# Patient Record
Sex: Female | Born: 1960 | Race: Black or African American | Hispanic: No | Marital: Single | Smoking: Never smoker
Health system: Southern US, Community
[De-identification: ages and names within clinical notes are randomized; demographics above are authoritative.]

## PROBLEM LIST (undated history)

## (undated) DIAGNOSIS — G35 Multiple sclerosis: Secondary | ICD-10-CM

## (undated) DIAGNOSIS — T148XXA Other injury of unspecified body region, initial encounter: Secondary | ICD-10-CM

## (undated) DIAGNOSIS — H60399 Other infective otitis externa, unspecified ear: Secondary | ICD-10-CM

## (undated) DIAGNOSIS — H119 Unspecified disorder of conjunctiva: Secondary | ICD-10-CM

## (undated) DIAGNOSIS — R5382 Chronic fatigue, unspecified: Secondary | ICD-10-CM

## (undated) DIAGNOSIS — M1990 Unspecified osteoarthritis, unspecified site: Secondary | ICD-10-CM

## (undated) DIAGNOSIS — Z9221 Personal history of antineoplastic chemotherapy: Secondary | ICD-10-CM

## (undated) DIAGNOSIS — J069 Acute upper respiratory infection, unspecified: Secondary | ICD-10-CM

## (undated) DIAGNOSIS — I10 Essential (primary) hypertension: Secondary | ICD-10-CM

## (undated) DIAGNOSIS — Z7901 Long term (current) use of anticoagulants: Secondary | ICD-10-CM

## (undated) DIAGNOSIS — F419 Anxiety disorder, unspecified: Secondary | ICD-10-CM

## (undated) DIAGNOSIS — C50919 Malignant neoplasm of unspecified site of unspecified female breast: Secondary | ICD-10-CM

## (undated) DIAGNOSIS — F411 Generalized anxiety disorder: Secondary | ICD-10-CM

## (undated) DIAGNOSIS — T7840XA Allergy, unspecified, initial encounter: Secondary | ICD-10-CM

## (undated) DIAGNOSIS — G4730 Sleep apnea, unspecified: Secondary | ICD-10-CM

## (undated) DIAGNOSIS — Z87898 Personal history of other specified conditions: Secondary | ICD-10-CM

## (undated) DIAGNOSIS — R05 Cough: Secondary | ICD-10-CM

## (undated) DIAGNOSIS — K635 Polyp of colon: Secondary | ICD-10-CM

## (undated) DIAGNOSIS — K5909 Other constipation: Secondary | ICD-10-CM

## (undated) DIAGNOSIS — T783XXA Angioneurotic edema, initial encounter: Secondary | ICD-10-CM

## (undated) DIAGNOSIS — D6481 Anemia due to antineoplastic chemotherapy: Secondary | ICD-10-CM

## (undated) DIAGNOSIS — E785 Hyperlipidemia, unspecified: Secondary | ICD-10-CM

## (undated) DIAGNOSIS — N950 Postmenopausal bleeding: Secondary | ICD-10-CM

## (undated) DIAGNOSIS — E65 Localized adiposity: Secondary | ICD-10-CM

## (undated) DIAGNOSIS — R079 Chest pain, unspecified: Secondary | ICD-10-CM

## (undated) DIAGNOSIS — R5381 Other malaise: Secondary | ICD-10-CM

## (undated) DIAGNOSIS — Z86010 Personal history of colonic polyps: Secondary | ICD-10-CM

## (undated) DIAGNOSIS — R198 Other specified symptoms and signs involving the digestive system and abdomen: Secondary | ICD-10-CM

## (undated) DIAGNOSIS — G43909 Migraine, unspecified, not intractable, without status migrainosus: Secondary | ICD-10-CM

## (undated) DIAGNOSIS — J329 Chronic sinusitis, unspecified: Secondary | ICD-10-CM

## (undated) DIAGNOSIS — K219 Gastro-esophageal reflux disease without esophagitis: Secondary | ICD-10-CM

## (undated) DIAGNOSIS — E739 Lactose intolerance, unspecified: Secondary | ICD-10-CM

## (undated) DIAGNOSIS — J0100 Acute maxillary sinusitis, unspecified: Secondary | ICD-10-CM

## (undated) DIAGNOSIS — B029 Zoster without complications: Secondary | ICD-10-CM

## (undated) DIAGNOSIS — J3089 Other allergic rhinitis: Secondary | ICD-10-CM

## (undated) DIAGNOSIS — H61899 Other specified disorders of external ear, unspecified ear: Secondary | ICD-10-CM

## (undated) DIAGNOSIS — C50411 Malignant neoplasm of upper-outer quadrant of right female breast: Secondary | ICD-10-CM

## (undated) DIAGNOSIS — R232 Flushing: Secondary | ICD-10-CM

## (undated) DIAGNOSIS — R062 Wheezing: Secondary | ICD-10-CM

## (undated) DIAGNOSIS — N390 Urinary tract infection, site not specified: Secondary | ICD-10-CM

## (undated) DIAGNOSIS — J22 Unspecified acute lower respiratory infection: Secondary | ICD-10-CM

## (undated) DIAGNOSIS — F329 Major depressive disorder, single episode, unspecified: Secondary | ICD-10-CM

## (undated) DIAGNOSIS — K589 Irritable bowel syndrome without diarrhea: Secondary | ICD-10-CM

## (undated) DIAGNOSIS — D6959 Other secondary thrombocytopenia: Secondary | ICD-10-CM

## (undated) DIAGNOSIS — E876 Hypokalemia: Secondary | ICD-10-CM

## (undated) DIAGNOSIS — R1012 Left upper quadrant pain: Secondary | ICD-10-CM

## (undated) HISTORY — DX: Other allergic rhinitis: J30.89

## (undated) HISTORY — DX: Multiple sclerosis: G35

## (undated) HISTORY — DX: Hyperlipidemia, unspecified: E78.5

## (undated) HISTORY — DX: Lactose intolerance, unspecified: E73.9

## (undated) HISTORY — DX: Sleep apnea, unspecified: G47.30

## (undated) HISTORY — DX: Essential (primary) hypertension: I10

## (undated) HISTORY — DX: Gastro-esophageal reflux disease without esophagitis: K21.9

## (undated) HISTORY — DX: Polyp of colon: K63.5

## (undated) HISTORY — DX: Angioneurotic edema, initial encounter: T78.3XXA

## (undated) HISTORY — DX: Anxiety disorder, unspecified: F41.9

## (undated) HISTORY — DX: Localized adiposity: E65

## (undated) HISTORY — DX: Zoster without complications: B02.9

## (undated) HISTORY — DX: Other constipation: K59.09

## (undated) HISTORY — DX: Migraine, unspecified, not intractable, without status migrainosus: G43.909

## (undated) HISTORY — DX: Irritable bowel syndrome, unspecified: K58.9

## (undated) HISTORY — DX: Flushing: R23.2

## (undated) HISTORY — PX: TUBAL LIGATION: SHX77

## (undated) HISTORY — DX: Malignant neoplasm of upper-outer quadrant of right female breast: C50.411

## (undated) HISTORY — DX: Allergy, unspecified, initial encounter: T78.40XA

## (undated) HISTORY — DX: Unspecified osteoarthritis, unspecified site: M19.90

---
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
## 1998-03-26 ENCOUNTER — Other Ambulatory Visit: Admission: RE | Admit: 1998-03-26 | Discharge: 1998-03-26 | Payer: Self-pay | Admitting: Internal Medicine

## 1999-03-25 ENCOUNTER — Emergency Department (HOSPITAL_COMMUNITY): Admission: EM | Admit: 1999-03-25 | Discharge: 1999-03-25 | Payer: Self-pay | Admitting: Emergency Medicine

## 1999-08-30 ENCOUNTER — Other Ambulatory Visit: Admission: RE | Admit: 1999-08-30 | Discharge: 1999-08-30 | Payer: Self-pay | Admitting: Emergency Medicine

## 1999-08-30 ENCOUNTER — Other Ambulatory Visit: Admission: RE | Admit: 1999-08-30 | Discharge: 1999-08-30 | Payer: Self-pay | Admitting: *Deleted

## 2000-01-10 ENCOUNTER — Other Ambulatory Visit: Admission: RE | Admit: 2000-01-10 | Discharge: 2000-01-10 | Payer: Self-pay | Admitting: *Deleted

## 2000-01-10 ENCOUNTER — Encounter (INDEPENDENT_AMBULATORY_CARE_PROVIDER_SITE_OTHER): Payer: Self-pay

## 2000-03-23 ENCOUNTER — Other Ambulatory Visit: Admission: RE | Admit: 2000-03-23 | Discharge: 2000-03-23 | Payer: Self-pay | Admitting: *Deleted

## 2001-01-13 ENCOUNTER — Emergency Department (HOSPITAL_COMMUNITY): Admission: EM | Admit: 2001-01-13 | Discharge: 2001-01-13 | Payer: Self-pay | Admitting: Emergency Medicine

## 2001-02-20 ENCOUNTER — Inpatient Hospital Stay (HOSPITAL_COMMUNITY): Admission: AD | Admit: 2001-02-20 | Discharge: 2001-02-20 | Payer: Self-pay | Admitting: Obstetrics

## 2001-04-11 ENCOUNTER — Other Ambulatory Visit: Admission: RE | Admit: 2001-04-11 | Discharge: 2001-04-11 | Payer: Self-pay | Admitting: *Deleted

## 2001-04-11 ENCOUNTER — Other Ambulatory Visit: Admission: RE | Admit: 2001-04-11 | Discharge: 2001-04-11 | Payer: Self-pay | Admitting: Obstetrics and Gynecology

## 2001-07-18 ENCOUNTER — Other Ambulatory Visit: Admission: RE | Admit: 2001-07-18 | Discharge: 2001-07-18 | Payer: Self-pay | Admitting: Obstetrics and Gynecology

## 2002-07-30 ENCOUNTER — Other Ambulatory Visit: Admission: RE | Admit: 2002-07-30 | Discharge: 2002-07-30 | Payer: Self-pay | Admitting: Obstetrics and Gynecology

## 2003-03-09 ENCOUNTER — Emergency Department (HOSPITAL_COMMUNITY): Admission: EM | Admit: 2003-03-09 | Discharge: 2003-03-10 | Payer: Self-pay | Admitting: Emergency Medicine

## 2003-03-09 ENCOUNTER — Encounter: Payer: Self-pay | Admitting: Emergency Medicine

## 2003-07-08 ENCOUNTER — Encounter: Admission: RE | Admit: 2003-07-08 | Discharge: 2003-07-08 | Payer: Self-pay | Admitting: Cardiology

## 2003-07-08 ENCOUNTER — Encounter: Payer: Self-pay | Admitting: Cardiology

## 2003-08-03 ENCOUNTER — Other Ambulatory Visit: Admission: RE | Admit: 2003-08-03 | Discharge: 2003-08-03 | Payer: Self-pay | Admitting: Obstetrics and Gynecology

## 2003-09-21 ENCOUNTER — Encounter: Admission: RE | Admit: 2003-09-21 | Discharge: 2003-09-21 | Payer: Self-pay | Admitting: Neurology

## 2004-08-23 ENCOUNTER — Ambulatory Visit (HOSPITAL_COMMUNITY): Admission: RE | Admit: 2004-08-23 | Discharge: 2004-08-23 | Payer: Self-pay | Admitting: Obstetrics and Gynecology

## 2004-09-02 ENCOUNTER — Other Ambulatory Visit: Admission: RE | Admit: 2004-09-02 | Discharge: 2004-09-02 | Payer: Self-pay | Admitting: Obstetrics and Gynecology

## 2004-09-02 ENCOUNTER — Ambulatory Visit: Payer: Self-pay | Admitting: Internal Medicine

## 2004-11-30 ENCOUNTER — Ambulatory Visit (HOSPITAL_BASED_OUTPATIENT_CLINIC_OR_DEPARTMENT_OTHER): Admission: RE | Admit: 2004-11-30 | Discharge: 2004-11-30 | Payer: Self-pay | Admitting: Cardiology

## 2004-11-30 ENCOUNTER — Ambulatory Visit: Payer: Self-pay | Admitting: Cardiology

## 2005-01-23 ENCOUNTER — Emergency Department (HOSPITAL_COMMUNITY): Admission: EM | Admit: 2005-01-23 | Discharge: 2005-01-23 | Payer: Self-pay | Admitting: Family Medicine

## 2005-01-25 ENCOUNTER — Ambulatory Visit: Payer: Self-pay | Admitting: Internal Medicine

## 2005-08-22 ENCOUNTER — Other Ambulatory Visit: Admission: RE | Admit: 2005-08-22 | Discharge: 2005-08-22 | Payer: Self-pay | Admitting: *Deleted

## 2005-09-15 ENCOUNTER — Ambulatory Visit (HOSPITAL_COMMUNITY): Admission: RE | Admit: 2005-09-15 | Discharge: 2005-09-15 | Payer: Self-pay | Admitting: *Deleted

## 2005-10-04 ENCOUNTER — Ambulatory Visit: Payer: Self-pay | Admitting: Internal Medicine

## 2006-01-09 ENCOUNTER — Ambulatory Visit: Payer: Self-pay | Admitting: Internal Medicine

## 2006-01-17 ENCOUNTER — Ambulatory Visit: Payer: Self-pay | Admitting: Gastroenterology

## 2006-01-18 ENCOUNTER — Ambulatory Visit: Payer: Self-pay | Admitting: Internal Medicine

## 2006-01-23 ENCOUNTER — Ambulatory Visit: Payer: Self-pay | Admitting: Internal Medicine

## 2006-03-27 ENCOUNTER — Ambulatory Visit: Payer: Self-pay | Admitting: Internal Medicine

## 2006-04-02 ENCOUNTER — Encounter (INDEPENDENT_AMBULATORY_CARE_PROVIDER_SITE_OTHER): Payer: Self-pay | Admitting: *Deleted

## 2006-04-02 ENCOUNTER — Ambulatory Visit: Payer: Self-pay | Admitting: Internal Medicine

## 2006-07-19 ENCOUNTER — Ambulatory Visit: Payer: Self-pay | Admitting: Internal Medicine

## 2006-08-03 ENCOUNTER — Ambulatory Visit: Payer: Self-pay | Admitting: Cardiology

## 2006-08-27 ENCOUNTER — Encounter: Payer: Self-pay | Admitting: Cardiology

## 2006-08-27 ENCOUNTER — Ambulatory Visit: Payer: Self-pay

## 2006-09-03 ENCOUNTER — Encounter: Payer: Self-pay | Admitting: Internal Medicine

## 2006-09-20 ENCOUNTER — Ambulatory Visit (HOSPITAL_COMMUNITY): Admission: RE | Admit: 2006-09-20 | Discharge: 2006-09-20 | Payer: Self-pay | Admitting: Obstetrics & Gynecology

## 2006-09-25 ENCOUNTER — Encounter: Admission: RE | Admit: 2006-09-25 | Discharge: 2006-09-25 | Payer: Self-pay | Admitting: Obstetrics & Gynecology

## 2006-12-27 ENCOUNTER — Encounter: Admission: RE | Admit: 2006-12-27 | Discharge: 2006-12-27 | Payer: Self-pay | Admitting: Neurology

## 2007-01-16 ENCOUNTER — Ambulatory Visit: Payer: Self-pay | Admitting: Cardiology

## 2007-01-21 ENCOUNTER — Ambulatory Visit: Payer: Self-pay | Admitting: Internal Medicine

## 2007-03-21 ENCOUNTER — Ambulatory Visit: Payer: Self-pay | Admitting: Cardiology

## 2007-03-25 ENCOUNTER — Encounter: Admission: RE | Admit: 2007-03-25 | Discharge: 2007-03-25 | Payer: Self-pay | Admitting: Obstetrics & Gynecology

## 2007-06-11 ENCOUNTER — Ambulatory Visit: Payer: Self-pay | Admitting: Internal Medicine

## 2007-09-02 LAB — CONVERTED CEMR LAB: Pap Smear: NORMAL

## 2007-09-18 ENCOUNTER — Encounter: Payer: Self-pay | Admitting: Internal Medicine

## 2007-09-18 DIAGNOSIS — G35 Multiple sclerosis: Secondary | ICD-10-CM

## 2007-09-18 DIAGNOSIS — Z87898 Personal history of other specified conditions: Secondary | ICD-10-CM

## 2007-09-18 HISTORY — DX: Personal history of other specified conditions: Z87.898

## 2007-09-20 ENCOUNTER — Ambulatory Visit: Payer: Self-pay | Admitting: Internal Medicine

## 2007-09-20 DIAGNOSIS — I10 Essential (primary) hypertension: Secondary | ICD-10-CM | POA: Insufficient documentation

## 2007-09-20 DIAGNOSIS — E65 Localized adiposity: Secondary | ICD-10-CM

## 2007-09-26 ENCOUNTER — Encounter: Admission: RE | Admit: 2007-09-26 | Discharge: 2007-09-26 | Payer: Self-pay | Admitting: Obstetrics & Gynecology

## 2007-11-15 ENCOUNTER — Encounter: Payer: Self-pay | Admitting: Internal Medicine

## 2007-12-10 ENCOUNTER — Encounter: Payer: Self-pay | Admitting: Internal Medicine

## 2007-12-11 ENCOUNTER — Telehealth: Payer: Self-pay | Admitting: Internal Medicine

## 2007-12-13 ENCOUNTER — Telehealth: Payer: Self-pay | Admitting: Internal Medicine

## 2007-12-30 ENCOUNTER — Ambulatory Visit: Payer: Self-pay | Admitting: Internal Medicine

## 2007-12-31 ENCOUNTER — Emergency Department (HOSPITAL_COMMUNITY): Admission: EM | Admit: 2007-12-31 | Discharge: 2007-12-31 | Payer: Self-pay | Admitting: Family Medicine

## 2007-12-31 LAB — CONVERTED CEMR LAB
ALT: 12 units/L (ref 0–35)
Calcium: 9.2 mg/dL (ref 8.4–10.5)
Cholesterol: 213 mg/dL (ref 0–200)
Direct LDL: 142.2 mg/dL
GFR calc non Af Amer: 72 mL/min
HDL: 42.3 mg/dL (ref >39.0)
Hgb A1c MFr Bld: 5.9 % (ref 4.6–6.0)
Potassium: 3.5 meq/L (ref 3.5–5.1)
Sodium: 141 meq/L (ref 135–145)
Total CHOL/HDL Ratio: 5
Triglycerides: 145 mg/dL (ref 0–149)

## 2008-01-07 ENCOUNTER — Ambulatory Visit: Payer: Self-pay | Admitting: Internal Medicine

## 2008-01-07 DIAGNOSIS — K219 Gastro-esophageal reflux disease without esophagitis: Secondary | ICD-10-CM | POA: Insufficient documentation

## 2008-01-07 DIAGNOSIS — E8881 Metabolic syndrome: Secondary | ICD-10-CM

## 2008-01-08 ENCOUNTER — Ambulatory Visit: Payer: Self-pay | Admitting: Cardiology

## 2008-01-21 ENCOUNTER — Telehealth (INDEPENDENT_AMBULATORY_CARE_PROVIDER_SITE_OTHER): Payer: Self-pay | Admitting: *Deleted

## 2008-01-27 ENCOUNTER — Encounter: Payer: Self-pay | Admitting: Internal Medicine

## 2008-02-13 ENCOUNTER — Encounter: Payer: Self-pay | Admitting: Internal Medicine

## 2008-04-24 ENCOUNTER — Ambulatory Visit: Payer: Self-pay | Admitting: Internal Medicine

## 2008-04-24 DIAGNOSIS — E785 Hyperlipidemia, unspecified: Secondary | ICD-10-CM

## 2008-04-30 ENCOUNTER — Encounter: Payer: Self-pay | Admitting: Internal Medicine

## 2008-05-04 ENCOUNTER — Encounter: Payer: Self-pay | Admitting: Internal Medicine

## 2008-05-20 ENCOUNTER — Telehealth: Payer: Self-pay | Admitting: Internal Medicine

## 2008-06-18 ENCOUNTER — Telehealth: Payer: Self-pay | Admitting: Internal Medicine

## 2008-06-22 ENCOUNTER — Ambulatory Visit: Payer: Self-pay | Admitting: Internal Medicine

## 2008-06-22 LAB — CONVERTED CEMR LAB
CRP, High Sensitivity: 16 — ABNORMAL HIGH (ref 0.00–5.00)
Cholesterol: 191 mg/dL (ref 0–200)

## 2008-06-24 ENCOUNTER — Ambulatory Visit: Payer: Self-pay | Admitting: Internal Medicine

## 2008-06-24 ENCOUNTER — Telehealth: Payer: Self-pay | Admitting: Internal Medicine

## 2008-06-24 DIAGNOSIS — R5381 Other malaise: Secondary | ICD-10-CM

## 2008-06-24 DIAGNOSIS — J069 Acute upper respiratory infection, unspecified: Secondary | ICD-10-CM | POA: Insufficient documentation

## 2008-06-24 DIAGNOSIS — R5383 Other fatigue: Secondary | ICD-10-CM

## 2008-06-24 HISTORY — DX: Other malaise: R53.81

## 2008-06-24 LAB — CONVERTED CEMR LAB
Basophils Absolute: 0 10*3/uL (ref 0.0–0.1)
HCT: 34.8 % — ABNORMAL LOW (ref 36.0–46.0)
Lymphocytes Relative: 24.7 % (ref 12.0–46.0)
MCHC: 33.3 g/dL (ref 30.0–36.0)
MCV: 74.9 fL — ABNORMAL LOW (ref 78.0–100.0)
Monocytes Absolute: 0.7 10*3/uL (ref 0.1–1.0)
Monocytes Relative: 8.1 % (ref 3.0–12.0)
Platelets: 386 10*3/uL (ref 150–400)
RBC: 4.64 M/uL (ref 3.87–5.11)
RDW: 16.3 % — ABNORMAL HIGH (ref 11.5–14.6)

## 2008-06-26 ENCOUNTER — Encounter: Payer: Self-pay | Admitting: Internal Medicine

## 2008-07-08 ENCOUNTER — Telehealth: Payer: Self-pay | Admitting: Internal Medicine

## 2008-07-15 ENCOUNTER — Telehealth: Payer: Self-pay | Admitting: Internal Medicine

## 2008-07-17 ENCOUNTER — Ambulatory Visit: Payer: Self-pay | Admitting: Internal Medicine

## 2008-08-17 DIAGNOSIS — K5909 Other constipation: Secondary | ICD-10-CM

## 2008-09-13 ENCOUNTER — Emergency Department (HOSPITAL_COMMUNITY): Admission: EM | Admit: 2008-09-13 | Discharge: 2008-09-13 | Payer: Self-pay | Admitting: Family Medicine

## 2008-09-26 ENCOUNTER — Ambulatory Visit: Payer: Self-pay | Admitting: Family Medicine

## 2008-09-28 ENCOUNTER — Encounter: Admission: RE | Admit: 2008-09-28 | Discharge: 2008-09-28 | Payer: Self-pay | Admitting: Obstetrics & Gynecology

## 2008-10-15 ENCOUNTER — Telehealth: Payer: Self-pay | Admitting: Internal Medicine

## 2008-10-15 ENCOUNTER — Ambulatory Visit: Payer: Self-pay | Admitting: Internal Medicine

## 2008-10-15 DIAGNOSIS — J3089 Other allergic rhinitis: Secondary | ICD-10-CM

## 2008-10-19 ENCOUNTER — Ambulatory Visit: Payer: Self-pay | Admitting: Internal Medicine

## 2009-02-09 ENCOUNTER — Ambulatory Visit: Payer: Self-pay | Admitting: Internal Medicine

## 2009-03-01 ENCOUNTER — Emergency Department (HOSPITAL_COMMUNITY): Admission: EM | Admit: 2009-03-01 | Discharge: 2009-03-01 | Payer: Self-pay | Admitting: Family Medicine

## 2009-03-02 ENCOUNTER — Ambulatory Visit: Payer: Self-pay | Admitting: Internal Medicine

## 2009-03-14 ENCOUNTER — Emergency Department (HOSPITAL_BASED_OUTPATIENT_CLINIC_OR_DEPARTMENT_OTHER): Admission: EM | Admit: 2009-03-14 | Discharge: 2009-03-14 | Payer: Self-pay | Admitting: Emergency Medicine

## 2009-04-19 ENCOUNTER — Telehealth: Payer: Self-pay | Admitting: Internal Medicine

## 2009-04-20 ENCOUNTER — Ambulatory Visit: Payer: Self-pay | Admitting: Internal Medicine

## 2009-04-20 ENCOUNTER — Telehealth: Payer: Self-pay | Admitting: Internal Medicine

## 2009-04-21 ENCOUNTER — Ambulatory Visit: Payer: Self-pay | Admitting: Gastroenterology

## 2009-04-21 DIAGNOSIS — Z860100 Personal history of colon polyps, unspecified: Secondary | ICD-10-CM | POA: Insufficient documentation

## 2009-04-21 HISTORY — DX: Personal history of colonic polyps: Z86.010

## 2009-04-22 ENCOUNTER — Encounter: Payer: Self-pay | Admitting: Physician Assistant

## 2009-04-22 LAB — CONVERTED CEMR LAB
ALT: 18 units/L (ref 0–35)
AST: 22 units/L (ref 0–37)
BUN: 8 mg/dL (ref 6–23)
Basophils Relative: 3.2 % — ABNORMAL HIGH (ref 0.0–3.0)
Chloride: 99 meq/L (ref 96–112)
Eosinophils Relative: 1.8 % (ref 0.0–5.0)
GFR calc non Af Amer: 98.91 mL/min (ref >60)
H Pylori IgG: POSITIVE
Lymphs Abs: 1.9 10*3/uL (ref 0.7–4.0)
MCHC: 34.1 g/dL (ref 30.0–36.0)
Monocytes Absolute: 0.4 10*3/uL (ref 0.1–1.0)
Monocytes Relative: 5.2 % (ref 3.0–12.0)
Neutrophils Relative %: 62.8 % (ref 43.0–77.0)
Potassium: 3.1 meq/L — ABNORMAL LOW (ref 3.5–5.1)
RBC: 4.75 M/uL (ref 3.87–5.11)
Sodium: 139 meq/L (ref 135–145)
Total Bilirubin: 1.1 mg/dL (ref 0.3–1.2)
WBC: 6.9 10*3/uL (ref 4.5–10.5)

## 2009-05-03 ENCOUNTER — Ambulatory Visit: Payer: Self-pay | Admitting: Internal Medicine

## 2009-05-03 LAB — CONVERTED CEMR LAB
BUN: 7 mg/dL (ref 6–23)
CO2: 30 meq/L (ref 19–32)
Calcium: 9.9 mg/dL (ref 8.4–10.5)
Creatinine, Ser: 0.7 mg/dL (ref 0.4–1.2)
GFR calc non Af Amer: 115.38 mL/min (ref >60)
Glucose, Bld: 106 mg/dL — ABNORMAL HIGH (ref 70–99)

## 2009-05-04 ENCOUNTER — Telehealth: Payer: Self-pay | Admitting: Internal Medicine

## 2009-05-13 ENCOUNTER — Telehealth: Payer: Self-pay | Admitting: Internal Medicine

## 2009-05-19 ENCOUNTER — Telehealth: Payer: Self-pay | Admitting: Physician Assistant

## 2009-05-20 ENCOUNTER — Telehealth: Payer: Self-pay | Admitting: Cardiology

## 2009-05-28 ENCOUNTER — Ambulatory Visit: Payer: Self-pay | Admitting: Cardiology

## 2009-05-31 ENCOUNTER — Encounter: Payer: Self-pay | Admitting: Physician Assistant

## 2009-06-02 ENCOUNTER — Telehealth: Payer: Self-pay | Admitting: Physician Assistant

## 2009-06-07 ENCOUNTER — Telehealth: Payer: Self-pay | Admitting: Internal Medicine

## 2009-06-11 ENCOUNTER — Telehealth: Payer: Self-pay | Admitting: Cardiology

## 2009-06-16 ENCOUNTER — Telehealth: Payer: Self-pay | Admitting: Physician Assistant

## 2009-06-25 ENCOUNTER — Telehealth: Payer: Self-pay | Admitting: Internal Medicine

## 2009-06-28 ENCOUNTER — Ambulatory Visit: Payer: Self-pay | Admitting: Internal Medicine

## 2009-06-29 LAB — CONVERTED CEMR LAB
BUN: 11 mg/dL (ref 6–23)
CO2: 29 meq/L (ref 19–32)
Calcium: 9.6 mg/dL (ref 8.4–10.5)
Creatinine, Ser: 1.2 mg/dL (ref 0.4–1.2)
Glucose, Bld: 87 mg/dL (ref 70–99)
Sodium: 139 meq/L (ref 135–145)

## 2009-06-30 ENCOUNTER — Telehealth: Payer: Self-pay | Admitting: Internal Medicine

## 2009-07-11 ENCOUNTER — Telehealth: Payer: Self-pay | Admitting: Internal Medicine

## 2009-07-12 ENCOUNTER — Telehealth: Payer: Self-pay | Admitting: Internal Medicine

## 2009-07-12 ENCOUNTER — Ambulatory Visit: Payer: Self-pay | Admitting: Internal Medicine

## 2009-07-27 ENCOUNTER — Telehealth: Payer: Self-pay | Admitting: Physician Assistant

## 2009-08-18 LAB — CONVERTED CEMR LAB

## 2009-08-23 ENCOUNTER — Ambulatory Visit: Payer: Self-pay | Admitting: Internal Medicine

## 2009-09-20 ENCOUNTER — Telehealth (INDEPENDENT_AMBULATORY_CARE_PROVIDER_SITE_OTHER): Payer: Self-pay | Admitting: *Deleted

## 2009-09-20 ENCOUNTER — Encounter: Payer: Self-pay | Admitting: Internal Medicine

## 2009-09-29 ENCOUNTER — Ambulatory Visit: Payer: Self-pay | Admitting: Internal Medicine

## 2009-09-30 ENCOUNTER — Ambulatory Visit: Payer: Self-pay | Admitting: Internal Medicine

## 2009-10-04 LAB — CONVERTED CEMR LAB
AST: 19 units/L (ref 0–37)
BUN: 6 mg/dL (ref 6–23)
Basophils Absolute: 0 10*3/uL (ref 0.0–0.1)
Calcium: 8.8 mg/dL (ref 8.4–10.5)
Chloride: 103 meq/L (ref 96–112)
Cholesterol: 209 mg/dL — ABNORMAL HIGH (ref 0–200)
Eosinophils Absolute: 0.2 10*3/uL (ref 0.0–0.7)
Eosinophils Relative: 3.2 % (ref 0.0–5.0)
HDL: 41.1 mg/dL (ref >39.00)
Hemoglobin: 13 g/dL (ref 12.0–15.0)
Lymphs Abs: 2.2 10*3/uL (ref 0.7–4.0)
MCHC: 33.2 g/dL (ref 30.0–36.0)
MCV: 92.7 fL (ref 78.0–100.0)
Neutro Abs: 3 10*3/uL (ref 1.4–7.7)
Nitrite: NEGATIVE
Platelets: 292 10*3/uL (ref 150.0–400.0)
RBC: 4.23 M/uL (ref 3.87–5.11)
Sodium: 136 meq/L (ref 135–145)
Total Bilirubin: 0.8 mg/dL (ref 0.3–1.2)
Total CHOL/HDL Ratio: 5
Total Protein, Urine: NEGATIVE mg/dL
Triglycerides: 245 mg/dL — ABNORMAL HIGH (ref 0.0–149.0)
Urobilinogen, UA: 0.2 (ref 0.0–1.0)
WBC: 6 10*3/uL (ref 4.5–10.5)

## 2009-10-05 ENCOUNTER — Ambulatory Visit: Payer: Self-pay | Admitting: Internal Medicine

## 2009-10-11 ENCOUNTER — Encounter (INDEPENDENT_AMBULATORY_CARE_PROVIDER_SITE_OTHER): Payer: Self-pay | Admitting: *Deleted

## 2009-10-11 ENCOUNTER — Ambulatory Visit: Payer: Self-pay | Admitting: Internal Medicine

## 2009-10-21 ENCOUNTER — Telehealth: Payer: Self-pay | Admitting: Internal Medicine

## 2009-11-01 ENCOUNTER — Encounter: Admission: RE | Admit: 2009-11-01 | Discharge: 2009-11-01 | Payer: Self-pay | Admitting: Obstetrics & Gynecology

## 2009-11-05 ENCOUNTER — Ambulatory Visit: Payer: Self-pay | Admitting: Internal Medicine

## 2009-11-05 DIAGNOSIS — L910 Hypertrophic scar: Secondary | ICD-10-CM

## 2009-11-29 ENCOUNTER — Telehealth: Payer: Self-pay | Admitting: Internal Medicine

## 2009-11-29 ENCOUNTER — Ambulatory Visit: Payer: Self-pay | Admitting: Internal Medicine

## 2009-11-29 DIAGNOSIS — J309 Allergic rhinitis, unspecified: Secondary | ICD-10-CM

## 2009-12-14 ENCOUNTER — Telehealth: Payer: Self-pay | Admitting: Internal Medicine

## 2009-12-14 ENCOUNTER — Ambulatory Visit: Payer: Self-pay | Admitting: Internal Medicine

## 2009-12-14 DIAGNOSIS — R198 Other specified symptoms and signs involving the digestive system and abdomen: Secondary | ICD-10-CM

## 2009-12-14 HISTORY — DX: Other specified symptoms and signs involving the digestive system and abdomen: R19.8

## 2009-12-15 ENCOUNTER — Encounter: Payer: Self-pay | Admitting: Internal Medicine

## 2010-01-12 ENCOUNTER — Telehealth: Payer: Self-pay | Admitting: Internal Medicine

## 2010-01-14 ENCOUNTER — Ambulatory Visit: Payer: Self-pay | Admitting: Internal Medicine

## 2010-01-14 DIAGNOSIS — J329 Chronic sinusitis, unspecified: Secondary | ICD-10-CM | POA: Insufficient documentation

## 2010-01-14 HISTORY — DX: Chronic sinusitis, unspecified: J32.9

## 2010-01-27 ENCOUNTER — Telehealth: Payer: Self-pay | Admitting: Internal Medicine

## 2010-04-22 ENCOUNTER — Telehealth: Payer: Self-pay | Admitting: Internal Medicine

## 2010-05-19 ENCOUNTER — Ambulatory Visit: Payer: Self-pay | Admitting: Cardiology

## 2010-05-19 DIAGNOSIS — R079 Chest pain, unspecified: Secondary | ICD-10-CM

## 2010-05-19 DIAGNOSIS — R0789 Other chest pain: Secondary | ICD-10-CM | POA: Insufficient documentation

## 2010-05-19 HISTORY — DX: Chest pain, unspecified: R07.9

## 2010-06-07 ENCOUNTER — Telehealth: Payer: Self-pay | Admitting: Internal Medicine

## 2010-06-07 ENCOUNTER — Ambulatory Visit: Payer: Self-pay | Admitting: Internal Medicine

## 2010-06-07 DIAGNOSIS — M7710 Lateral epicondylitis, unspecified elbow: Secondary | ICD-10-CM | POA: Insufficient documentation

## 2010-06-09 ENCOUNTER — Telehealth: Payer: Self-pay | Admitting: Internal Medicine

## 2010-06-10 ENCOUNTER — Encounter: Payer: Self-pay | Admitting: Internal Medicine

## 2010-08-19 ENCOUNTER — Ambulatory Visit: Payer: Self-pay | Admitting: Internal Medicine

## 2010-08-19 DIAGNOSIS — N390 Urinary tract infection, site not specified: Secondary | ICD-10-CM | POA: Insufficient documentation

## 2010-08-19 HISTORY — DX: Urinary tract infection, site not specified: N39.0

## 2010-08-19 LAB — CONVERTED CEMR LAB
Bilirubin Urine: NEGATIVE
Urobilinogen, UA: 0.2
pH: 7

## 2010-09-13 ENCOUNTER — Encounter: Payer: Self-pay | Admitting: Internal Medicine

## 2010-09-22 ENCOUNTER — Encounter: Payer: Self-pay | Admitting: Internal Medicine

## 2010-09-22 LAB — CONVERTED CEMR LAB

## 2010-10-08 ENCOUNTER — Encounter: Payer: Self-pay | Admitting: Neurology

## 2010-10-08 ENCOUNTER — Encounter: Payer: Self-pay | Admitting: Cardiology

## 2010-10-08 ENCOUNTER — Other Ambulatory Visit: Payer: Self-pay | Admitting: Obstetrics & Gynecology

## 2010-10-08 DIAGNOSIS — Z1239 Encounter for other screening for malignant neoplasm of breast: Secondary | ICD-10-CM

## 2010-10-09 ENCOUNTER — Encounter: Payer: Self-pay | Admitting: Internal Medicine

## 2010-10-09 ENCOUNTER — Encounter: Payer: Self-pay | Admitting: Obstetrics & Gynecology

## 2010-10-09 ENCOUNTER — Encounter: Payer: Self-pay | Admitting: Neurology

## 2010-10-10 ENCOUNTER — Encounter: Payer: Self-pay | Admitting: Neurology

## 2010-10-18 NOTE — Progress Notes (Signed)
Summary: protonix  Phone Note Call from Patient Call back at Home Phone [phone]   Caller: Patient Reason for Call: Refill Medication Summary of Call: Requesting refill on her protonix forgot to get at appt this am. Let pt know sent to [facility] Initial call taken by: Orlan Leavens RMA,  June 07, 2010 4:42 PM    Prescriptions: PROTONIX 40 MG TBEC (PANTOPRAZOLE SODIUM) 1 tablet by mouth once daily  #30 x 6   Entered by:   Orlan Leavens RMA   Authorized by:   Newt Lukes MD   Signed by:   Orlan Leavens RMA on 06/07/2010   Method used:   Electronically to        [facility] [address] [phone]* (retail)       [address] [place], [place]  [address]       Ph: [phone]       Fax: [phone]   RxID:   [id number]

## 2010-10-18 NOTE — Assessment & Plan Note (Signed)
Summary: APPT PER [facility] D/T--STC   Vital Signs:  Patient profile:   50 year old female Height:      67 inches (170.18 cm) Weight:      211.75 pounds (96.25 kg) O2 Sat:      98 % on Room air Temp:     97.1 degrees F (36.17 degrees C) oral Pulse rate:   74 / minute BP sitting:   140 / 80  (left arm) Cuff size:   large  Vitals Entered By: Josph Macho CMA (September 29, 2009 10:09 AM)  O2 Flow:  Room air CC: Congestion (coughing up "green" phlegm) X1week/ pt states she is no longer taking Maxrobid/ CF Is Patient Diabetic? No   Primary Care Provider:  Newt Lukes MD  CC:  Congestion (coughing up "green" phlegm) X1week/ pt states she is no longer taking Maxrobid/ CF.  History of Present Illness: here today with complaint of  chest cold and cough. onset of symptoms was 9 days ago. course has been gradual onset and now occurs in progressive, predominately nocturnal pattern. problem precipitated by contact with sick kids in church nursery last week symptom characterized as rattle in chest and cough productive of green sputum -  problem associated with fatigue and LGF but not associated with sweats, hoarseness, sore throat or PND. symptoms improved by OTC meds during the day but not effective at night. symptoms worsened at night with lying down and cool air. no prior hx of same symptoms in recent months.   Current Medications (verified): 1)  Hydrochlorothiazide 25 Mg Tabs (Hydrochlorothiazide) .... Take 1 Tablet By Mouth Once A Day 2)  Carvedilol 25 Mg Tabs (Carvedilol) .... Take 1 Two Times A Day 3)  Frova 2.5 Mg  Tabs (Frovatriptan Succinate) .... As Needed Headache 4)  Copaxone 20 Mg/ml Kit (Glatiramer Acetate) .... One Injection Daily 5)  Alprazolam 0.25 Mg Tabs (Alprazolam) .... 1/2 Tablet By Mouth Once Daily As Needed 6)  Iron 325 (65 Fe) Mg Tabs (Ferrous Sulfate) .... Once Daily 7)  Remeron 15 Mg Tabs (Mirtazapine) .... One By Mouth At Bedtime For Depression Prn 8)   Macrobid 100 Mg Caps (Nitrofurantoin Monohyd Macro) .... Two Times A Day For 5 Days  Allergies (verified): 1)  ! Ace Inhibitors 2)  ! Biaxin  Past History:  Past Medical History: Last updated: 05/28/2009 1. Migraine Headache 2. Hypertension  3. Multiple Sclerosis 4. Obesity - metabolic syndrome 5. Allergic rhinitis   6. Angioedema - ACE inhibitor 7. GERD 8. 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 Atypical chest pain: myoview (11/07) showed EF 85%, no evidence for ischemia or infarction.  10.  Echo (12/07): EF 60%, no valvular abnormalities.   Review of Systems  The patient denies chest pain, headaches, hemoptysis, and abdominal pain.    Physical Exam  General:  alert, well-developed, well-nourished, and cooperative to examination.    Ears:  normal pinnae bilaterally, without erythema, swelling, or tenderness to palpation. TMs clear, without effusion, or cerumen impaction. Hearing grossly normal bilaterally  Mouth:  teeth and gums in good repair; mucous membranes moist, without lesions or ulcers. oropharynx clear without exudate, no erythema.  Lungs:  normal respiratory effort, no intercostal retractions or use of accessory muscles; few rhonchi bilaterally - but no crackles and no wheezes.    Heart:  normal rate, regular rhythm, no murmur, and no rub. BLE without edema.    Impression & Recommendations:  Problem # 1:  ACUTE BRONCHITIS (ICD-466.0)  Her updated medication list for this problem  includes:    Amoxicillin 500 Mg Tabs (Amoxicillin) .[name]... 1 by mouth three times a day x 7days  Take antibiotics and other medications as directed. Encouraged to push clear liquids, get enough rest, and take acetaminophen as needed. To be seen in 5-7 days if no improvement, sooner if worse. Also given Diflucan to use after completed with amox given hx severe candidiasis  Orders: Prescription Created Electronically [phone])  Complete Medication List: 1)  Hydrochlorothiazide 25 Mg Tabs  (Hydrochlorothiazide) .... Take 1 tablet by mouth once a day 2)  Carvedilol 25 Mg Tabs (Carvedilol) .... Take 1 two times a day 3)  Frova 2.5 Mg Tabs (Frovatriptan succinate) .... As needed headache 4)  Copaxone 20 Mg/ml Kit (Glatiramer acetate) .... One injection daily 5)  Alprazolam 0.25 Mg Tabs (Alprazolam) .... 1/2 tablet by mouth once daily as needed 6)  Iron 325 (65 Fe) Mg Tabs (Ferrous sulfate) .... Once daily 7)  Remeron 15 Mg Tabs (Mirtazapine) .... One by mouth at bedtime for depression prn 8)  Amoxicillin 500 Mg Tabs (Amoxicillin) .[name].. 1 by mouth three times a day x 7days 9)  Fluconazole 150 Mg Tabs (Fluconazole) .[name].. 1 by mouth once daily as needed for yeast infection  Patient Instructions: 1)  it was good to see you today.  2)  will prescribe antibitoic - amoxicillin - for your bronchitis and yeast pill - diflucan - to use after done with amoxicillin; your prescriptions have been electronically submitted to your pharmacy. Please take as directed. Contact our office if you believe you're having problems with the medication(s).  3)  Acute bronchitis symptoms: take cough medications as you are doing (over the counter like mucinex ok to use during the day). call if no improvement in  5-7 days, sooner if increasing cough, fever, or new symptoms( shortness of breath, chest pain). Prescriptions: FLUCONAZOLE 150 MG TABS (FLUCONAZOLE) 1 by mouth once daily as needed for yeast infection  #1 x 1   Entered and Authorized by:   Newt Lukes MD   Signed by:   Newt Lukes MD on 09/29/2009   Method used:   Electronically to        [facility] [address] [phone]* (retail)       [address] [place], [place]  [address]       Ph: [phone]       Fax: [phone]   RxID:   [id number] AMOXICILLIN 500 MG TABS (AMOXICILLIN) 1 by mouth three times a day x 7days  #21 x 0   Entered and Authorized by:   Newt Lukes MD   Signed by:   Newt Lukes MD on  09/29/2009   Method used:   Electronically to        [facility] [address] [phone]* (retail)       [address] [place], [place]  [address]       Ph: [phone]       Fax: [phone]   RxID:   [id number]

## 2010-10-18 NOTE — Assessment & Plan Note (Signed)
Summary: CPX-#-LB   Vital Signs:  Patient profile:   50 year old female Height:      67 inches (170.18 cm) Weight:      211.38 pounds (96.08 kg) O2 Sat:      98 % on Room air Temp:     97.2 degrees F (36.22 degrees C) oral Pulse rate:   71 / minute BP sitting:   144 / 88  (left arm) Cuff size:   large  Vitals Entered By: Josph Macho CMA (October 05, 2009 8:01 AM)  O2 Flow:  Room air CC: Physical/ CF Is Patient Diabetic? No   Primary Care Provider:  Newt Lukes MD  CC:  Physical/ CF.  History of Present Illness: patient is here today for annual physical. Patient feels well and has no complaints.  up to date on mammo and PAP planning to start exercise - walking EKG done 05/2009 - normal - declines repreat today  Preventive Screening-Counseling & Management  Alcohol-Tobacco     Alcohol drinks/day: <1     Alcohol Counseling: not indicated; patient does not drink     Smoking Status: never     Passive Smoke Exposure: no     Tobacco Counseling: not indicated; no tobacco use  Caffeine-Diet-Exercise     Diet Counseling: to improve diet; diet is suboptimal     Nutrition Referrals: no     Does Patient Exercise: yes     Type of exercise: walk     Exercise Counseling: to improve exercise regimen     Depression Counseling: not indicated; screening negative for depression  Safety-Violence-Falls     Seat Belt Counseling: not indicated; patient wears seat belts     Helmet Counseling: not applicable     Smoke Detectors: yes     Violence in the Home: no risk noted     Fall Risk Counseling: not indicated; no significant falls noted  Clinical Review Panels:  Prevention   Last Mammogram:  historical (09/18/2008)   Last Pap Smear:  historical (08/18/2009)   Last Colonoscopy:  Done (04/02/2006)  Immunizations   Last Tetanus Booster:  given (07/02/2000)   Last Flu Vaccine:  Declined (06/24/2008)  Lipid Management   Cholesterol:  209 (09/30/2009)   LDL (bad  choesterol):  126 (06/22/2008)   HDL (good cholesterol):  41.10 (09/30/2009)  CBC   WBC:  6.0 (09/30/2009)   RBC:  4.23 (09/30/2009)   Hgb:  13.0 (09/30/2009)   Hct:  39.2 (09/30/2009)   Platelets:  292.0 (09/30/2009)   MCV  92.7 (09/30/2009)   MCHC  33.2 (09/30/2009)   RDW  13.5 (09/30/2009)   PMN:  50.0 (09/30/2009)   Lymphs:  35.9 (09/30/2009)   Monos:  10.2 (09/30/2009)   Eosinophils:  3.2 (09/30/2009)   Basophil:  0.7 (09/30/2009)  Complete Metabolic Panel   Glucose:  69 (09/30/2009)   Sodium:  136 (09/30/2009)   Potassium:  3.6 (09/30/2009)   Chloride:  103 (09/30/2009)   CO2:  28 (09/30/2009)   BUN:  6 (09/30/2009)   Creatinine:  0.8 (09/30/2009)   Albumin:  3.8 (09/30/2009)   Total Protein:  7.5 (09/30/2009)   Calcium:  8.8 (09/30/2009)   Total Bili:  0.8 (09/30/2009)   Alk Phos:  64 (09/30/2009)   SGPT (ALT):  17 (09/30/2009)   SGOT (AST):  19 (09/30/2009)   Current Medications (verified): 1)  Hydrochlorothiazide 25 Mg Tabs (Hydrochlorothiazide) .... Take 1 Tablet By Mouth Once A Day 2)  Carvedilol 25 Mg Tabs (  Carvedilol) .... Take 1 Two Times A Day 3)  Frova 2.5 Mg  Tabs (Frovatriptan Succinate) .... As Needed Headache 4)  Copaxone 20 Mg/ml Kit (Glatiramer Acetate) .... One Injection Daily 5)  Alprazolam 0.25 Mg Tabs (Alprazolam) .... 1/2 Tablet By Mouth Once Daily As Needed 6)  Iron 325 (65 Fe) Mg Tabs (Ferrous Sulfate) .... Once Daily 7)  Remeron 15 Mg Tabs (Mirtazapine) .... One By Mouth At Bedtime For Depression Prn 8)  Amoxicillin 500 Mg Tabs (Amoxicillin) .[name].. 1 By Mouth Three Times A Day X 7days 9)  Fluconazole 150 Mg Tabs (Fluconazole) .[name].. 1 By Mouth Once Daily As Needed For Yeast Infection  Allergies (verified): 1)  ! Ace Inhibitors 2)  ! Biaxin  Past History:  Past medical, surgical, family and social histories (including risk factors) reviewed, and no changes noted (except as noted below).  Past Medical History: Reviewed history from  05/28/2009 and no changes required. 1. Migraine Headache 2. Hypertension  3. Multiple Sclerosis 4. Obesity - metabolic syndrome 5. Allergic rhinitis   6. Angioedema - ACE inhibitor 7. GERD 8. 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 Atypical chest pain: myoview (11/07) showed EF 85%, no evidence for ischemia or infarction.  10.  Echo (12/07): EF 60%, no valvular abnormalities.   Past Surgical History: Reviewed history from 03/02/2009 and no changes required. Tubal ligation     Family History: Reviewed history from 05/28/2009 and no changes required. Mother developed CAD in her 43s.  Family History Diabetes 1st degree relative Family History High cholesterol Family History Hypertension    Social History: Reviewed history from 08/23/2009 and no changes required. Occupation:  Best boy, [employer] Single Never Smoked Alcohol use-no      Illicit Drug Use - no Does Patient Exercise:  yes  Review of Systems       see HPI above. I have reviewed all other systems and they were negative.   Physical Exam  General:  alert, well-developed, well-nourished, and cooperative to examination.    Eyes:  vision grossly intact; pupils equal, round and reactive to light.  conjunctiva and lids normal.    Ears:  normal pinnae bilaterally, without erythema, swelling, or tenderness to palpation. TMs clear, without effusion, or cerumen impaction. Hearing grossly normal bilaterally  Nose:  External nasal examination shows no deformity or inflammation. Nasal mucosa are pink and moist without lesions or exudates. Mouth:  teeth and gums in good repair; mucous membranes moist, without lesions or ulcers. oropharynx clear without exudate, erythema.  Lungs:  normal respiratory effort, no intercostal retractions or use of accessory muscles; normal breath sounds bilaterally - no crackles and no wheezes.    Heart:  normal rate, regular rhythm, no murmur, and no rub. BLE without edema. normal  DP pulses and normal cap refill in all 4 extremities    Abdomen:  soft, non-tender, normal bowel sounds, no distention; no masses and no appreciable hepatomegaly or splenomegaly.   Genitalia:  deferred to gyn Msk:  No deformity or scoliosis noted of thoracic or lumbar spine.   Neurologic:  alert & oriented X3 and cranial nerves II-XII symetrically intact.  strength normal in all extremities, sensation intact to light touch, and gait normal. speech fluent without dysarthria or aphasia; follows commands with good comprehension.  Skin:  no rashes, vesicles, ulcers, or erythema. No nodules or irregularity to palpation.  Psych:  Oriented X3, memory intact for recent and remote, normally interactive, good eye contact, not anxious appearing, not depressed appearing, and not agitated.  Impression & Recommendations:  Problem # 1:  HEALTH MAINTENANCE EXAM (ICD-V70.0) Patient has been counseled on age-appropriate routine health concerns for screening and prevention.  These are reviewed and up-to-date. Immunizations are up-to-date or declined. Labs reviewed, ECG declined as normal in 05/2009 (reviewed).   Complete Medication List: 1)  Hydrochlorothiazide 25 Mg Tabs (Hydrochlorothiazide) .... Take 1 tablet by mouth once a day 2)  Carvedilol 25 Mg Tabs (Carvedilol) .... Take 1 two times a day 3)  Frova 2.5 Mg Tabs (Frovatriptan succinate) .... As needed headache 4)  Copaxone 20 Mg/ml Kit (Glatiramer acetate) .... One injection daily 5)  Alprazolam 0.25 Mg Tabs (Alprazolam) .... 1/2 tablet by mouth once daily as needed 6)  Iron 325 (65 Fe) Mg Tabs (Ferrous sulfate) .... Once daily 7)  Remeron 15 Mg Tabs (Mirtazapine) .... One by mouth at bedtime for depression prn 8)  Amoxicillin 500 Mg Tabs (Amoxicillin) .[name].. 1 by mouth three times a day x 7days 9)  Fluconazole 150 Mg Tabs (Fluconazole) .[name].. 1 by mouth once daily as needed for yeast infection 10)  Mucinex D 60-600 Mg Xr12h-tab  (Pseudoephedrine-guaifenesin) .[name].. 1 by mouth two times a day as needed cough 11)  Oscal 500/200 D-3 500-200 Mg-unit Tabs (Calcium-vitamin d) .[name].. 1 by mouth two times a day 12)  Vitamin D 1000 Unit Tabs (Cholecalciferol) .[name].. 1 by mouth once daily  Patient Instructions: 1)  it was good to see you today.  2)  continue with plans for exercise as you are doing as it is important that you work on losing weight - monitor your diet and consume fewer calories such as less carbohydrates (sugar) and less fat. 3)  you also need to increase your physical activity level - start by walking for 10-20 minutes 3 times per week and work up to 30 minutes 4-5 times each week. you should be "short of breath" but not "out of breath" when you are exercising 4)  goal weight loss: 1 pound each week, no more than 5 pounds per month 5)  Take calcium +Vitamin D daily as discussed 6)  Please schedule a follow-up appointment in 3 months for blood pressure and weight recheck, sooner if problems.  Prescriptions: VITAMIN D 1000 UNIT TABS (CHOLECALCIFEROL) 1 by mouth once daily  #100 x 0   Entered and Authorized by:   Newt Lukes MD   Signed by:   Newt Lukes MD on 10/05/2009   Method used:   Print then Give to Patient   RxID:   [id number] OSCAL 500/200 D-3 500-200 MG-UNIT TABS (CALCIUM-VITAMIN D) 1 by mouth two times a day  #250 x 0   Entered and Authorized by:   Newt Lukes MD   Signed by:   Newt Lukes MD on 10/05/2009   Method used:   Print then Give to Patient   RxID:   [id number] HYDROCHLOROTHIAZIDE 25 MG TABS (HYDROCHLOROTHIAZIDE) Take 1 tablet by mouth once a day  #30 x 11   Entered and Authorized by:   Newt Lukes MD   Signed by:   Newt Lukes MD on 10/05/2009   Method used:   Electronically to        [facility] [address] [phone]* (retail)       [address] [place], [place]  [address]       Ph: [phone]       Fax: [phone]   RxID:    [id number]  MUCINEX D 60-600 MG XR12H-TAB (PSEUDOEPHEDRINE-GUAIFENESIN) 1 by mouth two times a day as needed cough  #20 x 0   Entered and Authorized by:   Newt Lukes MD   Signed by:   Newt Lukes MD on 10/05/2009   Method used:   Print then Give to Patient   RxID:   [id number]   Preventive Care Screening  Pap Smear:    Date:  08/18/2009    Results:  historical   Mammogram:    Date:  09/18/2008    Results:  historical

## 2010-10-18 NOTE — Assessment & Plan Note (Signed)
Summary: 1 YR/DMP  Medications Added PROTONIX 40 MG TBEC (PANTOPRAZOLE SODIUM) 1 tablet by mouth once daily      Allergies Added:   Visit Type:  Follow-up Referring Provider:  Sanda Linger, MD Primary Provider:  Newt Lukes MD  CC:  nocomplaints- Pt states shehasnot taken any meds this morning.  History of Present Illness: 50 yo with history of HTN, atypical chest pain, and multiple sclerosis presents for followup.  She has had no further episodes of chest pain.  MS is stable.  She is doing quite well and is walking up to 4 miles a day.  She has lost weight.  No exertional dyspnea.  BP is elevated at 150/90 today but she has been off her BP meds x 2 days (just got refills today).  On medication, her SBP has been running in the 130s.    Labs (8/10): ceratinine 0.7 Labs (1/11): HDL 41, LDL 131, creatinine 0.8, K 3.6  Current Medications (verified): 1)  Hydrochlorothiazide 25 Mg Tabs (Hydrochlorothiazide) .... Take 1 Tablet By Mouth Once A Day 2)  Carvedilol 25 Mg Tabs (Carvedilol) .... Take 1 Two Times A Day 3)  Frova 2.5 Mg  Tabs (Frovatriptan Succinate) .... As Needed Headache 4)  Copaxone 20 Mg/ml Kit (Glatiramer Acetate) .... One Injection Daily 5)  Alprazolam 0.25 Mg Tabs (Alprazolam) .... 1/2 Tablet By Mouth Once Daily As Needed 6)  Iron 325 (65 Fe) Mg Tabs (Ferrous Sulfate) .... Once Daily 7)  Vitamin D 1000 Unit Tabs (Cholecalciferol) .[name].. 1 By Mouth Once Daily 8)  Claritin-D 12 Hour 5-120 Mg Xr12h-Tab (Loratadine-Pseudoephedrine) .[name].. 1 By Mouth  Every Morning X 10days, Then As Needed For Congestion and Allergy Symptoms 9)  Nasonex 50 Mcg/act Susp (Mometasone Furoate) .... 2 Sprays Each Nostril Once Daily 10)  Azelastine Hcl 137 Mcg/spray Soln (Azelastine Hcl) .... 2 Sprays Each Nostril Two Times A Day 11)  Protonix 40 Mg Tbec (Pantoprazole Sodium) .[name].. 1 Tablet By Mouth Once Daily  Allergies (verified): 1)  ! Ace Inhibitors 2)  ! Biaxin  Past History:  Past  Medical History: Reviewed history from 01/14/2010 and no changes required. 1. Migraine Headache 2. Hypertension  3. Multiple Sclerosis 4. Obesity - metabolic syndrome 5. Allergic rhinitis   6. Angioedema - ACE inhibitor 7. GERD 8. 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 Atypical chest pain: myoview (11/07) showed EF 85%, no evidence for ischemia or infarction.  10.  Echo (12/07): EF 60%, no valvular abnormalities.   Family History: Reviewed history from 05/28/2009 and no changes required. Mother developed CAD in her 74s.  Family History Diabetes 1st degree relative Family History High cholesterol Family History Hypertension    Social History: Reviewed history from 08/23/2009 and no changes required. Occupation:  Best boy, [employer] Single Never Smoked Alcohol use-no     Illicit Drug Use - no  Vital Signs:  Patient profile:   50 year old female Height:      67 inches Weight:      213 pounds BMI:     33.48 Pulse rate:   79 / minute BP sitting:   150 / 90  (left arm) Cuff size:   large  Vitals Entered By: Burnett Kanaris, CNA (May 19, 2010 8:59 AM)  Physical Exam  General:  Well developed, well nourished, in no acute distress. Neck:  Neck supple, no JVD. No masses, thyromegaly or abnormal cervical nodes. Lungs:  Clear bilaterally to auscultation and percussion. Heart:  Non-displaced PMI, chest non-tender; regular rate  and rhythm, S1, S2 without murmurs, rubs or gallops. Carotid upstroke normal, no bruit.  Pedals normal pulses. No edema, no varicosities. Abdomen:  Bowel sounds positive; abdomen soft and non-tender without masses, organomegaly, or hernias noted. No hepatosplenomegaly. Extremities:  No clubbing or cyanosis. Neurologic:  Alert and oriented x 3. Psych:  Normal affect.   Impression & Recommendations:  Problem # 1:  HYPERTENSION (ICD-401.9) BP is high but she has not taken her meds today.  It is doing ok when she is taking her meds.    Problem # 2:  CHEST PAIN-UNSPECIFIED (ICD-786.50) History of recurrent CP with normal myoview.  No further workup necessary.   May followup as needed in this office in the future.

## 2010-10-18 NOTE — Assessment & Plan Note (Signed)
Summary: HARD SPOT ON LIP  STC   Vital Signs:  Patient profile:   50 year old female Height:      67 inches (170.18 cm) Weight:      206 pounds (93.64 kg) O2 Sat:      97 % on Room air Temp:     98.6 degrees F (37.00 degrees C) oral Pulse rate:   88 / minute BP sitting:   120 / 82  (left arm) Cuff size:   large  Vitals Entered By: Orlan Leavens (November 05, 2009 4:12 PM)  O2 Flow:  Room air CC: Pt states she burst her lip about  month ago, still sore and have a hard spot Is Patient Diabetic? No Pain Assessment Patient in pain? no        Primary Care Provider:  Newt Lukes MD  CC:  Pt states she burst her lip about  month ago and still sore and have a hard spot.  History of Present Illness: c/o sore area on lower lip onset >1 mo ago problem precipitate by trauma (bust lower lip open when fell - see last OV for details) assoc with hard "knot" visible just below the lip not a/w ulceration or blister but +tender to any touch or stimulation no fever, no drainage - does not interfer with eating/drinking - not spreading to other areas  requests flonase for allergy and sinus symptoms which have flared in recent weeks no fever, no HA has used same in past  no flares of MS - no med changes  Current Medications (verified): 1)  Hydrochlorothiazide 25 Mg Tabs (Hydrochlorothiazide) .... Take 1 Tablet By Mouth Once A Day 2)  Carvedilol 25 Mg Tabs (Carvedilol) .... Take 1 Two Times A Day 3)  Frova 2.5 Mg  Tabs (Frovatriptan Succinate) .... As Needed Headache 4)  Copaxone 20 Mg/ml Kit (Glatiramer Acetate) .... One Injection Daily 5)  Alprazolam 0.25 Mg Tabs (Alprazolam) .... 1/2 Tablet By Mouth Once Daily As Needed 6)  Iron 325 (65 Fe) Mg Tabs (Ferrous Sulfate) .... Once Daily 7)  Remeron 15 Mg Tabs (Mirtazapine) .... One By Mouth At Bedtime For Depression Prn 8)  Mucinex D 60-600 Mg Xr12h-Tab (Pseudoephedrine-Guaifenesin) .[name].. 1 By Mouth Two Times A Day As Needed Cough 9)   Oscal 500/200 D-3 500-200 Mg-Unit Tabs (Calcium-Vitamin D) .[name].. 1 By Mouth Two Times A Day 10)  Vitamin D 1000 Unit Tabs (Cholecalciferol) .[name].. 1 By Mouth Once Daily  Allergies (verified): 1)  ! Ace Inhibitors 2)  ! Biaxin  Past History:  Past Medical History: Reviewed history from 05/28/2009 and no changes required. 1. Migraine Headache 2. Hypertension  3. Multiple Sclerosis 4. Obesity - metabolic syndrome 5. Allergic rhinitis   6. Angioedema - ACE inhibitor 7. GERD 8. 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 Atypical chest pain: myoview (11/07) showed EF 85%, no evidence for ischemia or infarction.  10.  Echo (12/07): EF 60%, no valvular abnormalities.   Review of Systems  The patient denies anorexia, fever, weight loss, headaches, and abnormal bleeding.    Physical Exam  General:  alert, well-developed, well-nourished, and cooperative to examination.   overweight-appearing.   Mouth:  small falt scar on inner lower lip, left side due to trauma - exterior with small keloid (3mm linear) from edge of lower lip towards chin - firm & nodular to palp which causes discomfort Lungs:  normal respiratory effort, no intercostal retractions or use of accessory muscles; normal breath sounds bilaterally - no crackles and no  wheezes.    Heart:  normal rate, regular rhythm, no murmur, and no rub. BLE without edema.   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 Take 1 tablet by mouth once a day 2)  Carvedilol 25 Mg Tabs (Carvedilol) .... Take 1 two times a day 3)  Frova 2.5 Mg Tabs (Frovatriptan succinate) .... As needed headache 4)  Copaxone 20 Mg/ml Kit (Glatiramer acetate) .... One injection daily 5)  Alprazolam 0.25 Mg Tabs (Alprazolam) .... 1/2 tablet by mouth once daily as needed 6)  Iron 325 (65 Fe) Mg Tabs (Ferrous sulfate) .... Once daily 7)  Remeron 15 Mg Tabs (Mirtazapine) .... One by mouth at bedtime for depression prn 8)  Mucinex D 60-600 Mg Xr12h-tab (Pseudoephedrine-guaifenesin) .[name].. 1 by mouth two times a day as needed cough 9)  Oscal 500/200 D-3 500-200 Mg-unit Tabs (Calcium-vitamin d) .[name].. 1 by mouth two times a day 10)  Vitamin D 1000 Unit Tabs (Cholecalciferol) .[name].. 1 by mouth once daily 11)  Lidocaine Hcl 4 % Soln (Lidocaine hcl) .... Apply to affected lip area three times a day as needed for pain symptoms 12)  Fluticasone Propionate 50 Mcg/act Susp (Fluticasone propionate) .[name].. 1 spray to each nostril every morning  Patient Instructions: 1)  it was good to see you today. 2)  apply lidocaine (numbing) soultion to lip scar as needed for pain - your prescription has been electronically submitted to your pharmacy. Please take as directed. Contact our office if you believe you're having problems with the medication(s).  3)  i expect this scar will soften over time, but if it is getting worse or contiues to bother you despite this medication, let us know so we can refer to plastic surgery for evaluation and treatment 4)  Please schedule a follow-up appointment in 3 months for review of your blood pressure and other problems, call sooner if problems.  Prescriptions: FLUTICASONE PROPIONATE 50 MCG/ACT SUSP (FLUTICASONE PROPIONATE) 1 spray to each nostril every morning  #1 x 5   Entered and Authorized by:   Newt Lukes MD   Signed by:   Newt Lukes MD on 11/05/2009   Method used:   Electronically to        [facility]  [address] [phone]* (retail)       [address] [place], [place]  [address]       Ph: [phone]       Fax: [phone]   RxID:   [id number] LIDOCAINE HCL 4 % SOLN (LIDOCAINE HCL) apply to affected lip area three times a day as needed for pain symptoms  #50cc x 0   Entered and Authorized by:   Newt Lukes MD   Signed by:   Newt Lukes MD on 11/05/2009   Method used:   Electronically to        [facility]. [phone]* (retail)       [address]  [place], [place]  [address]       Ph: [phone]       Fax: [phone]   RxID:   [id number]

## 2010-10-18 NOTE — Progress Notes (Signed)
Summary: [employer] approved Protonix 40 MG until 09-20-2010.   Phone Note Outgoing Call   Call placed by: Joselyn Glassman,  September 20, 2009 2:43 PM Call placed to: Patient Summary of Call: [facility], they did approve the Protonix  40 MG once daily for 1 year until 09-20-2010. Notified pt and called Pharmacy, [facility] [address] to advise them as well. Initial call taken by: Joselyn Glassman,  September 20, 2009 2:45 PM

## 2010-10-18 NOTE — Assessment & Plan Note (Signed)
Summary: SINUS INFECTION/NWS   Vital Signs:  Patient profile:   50 year old female Height:      67 inches (170.18 cm) Weight:      216 pounds (98.18 kg) O2 Sat:      96 % on Room air Temp:     98.6 degrees F (37.00 degrees C) oral Pulse rate:   86 / minute BP sitting:   120 / 82  (left arm) Cuff size:   regular  Vitals Entered By: Orlan Leavens (January 14, 2010 8:35 AM)  O2 Flow:  Room air CC: sinus infection Is Patient Diabetic? No Pain Assessment Patient in pain? no        Primary Care Provider:  Newt Lukes MD  CC:  sinus infection.  History of Present Illness: c/o nasal congestion and sinus pressure - onset 3-4 days ago - a/w right ear fullness and pain under right eye  "muffled" hearing but no hearing loss -no pain in ear or head -no vision changes some relief with OTC claritin d, also nasal spray no fever - mild ST from nasal drainage yesterday AM occ nasal discharge - greenish from right side nostril only- thick white chest sputum with AM cough  Current Medications (verified): 1)  Hydrochlorothiazide 25 Mg Tabs (Hydrochlorothiazide) .... Take 1 Tablet By Mouth Once A Day 2)  Carvedilol 25 Mg Tabs (Carvedilol) .... Take 1 Two Times A Day 3)  Frova 2.5 Mg  Tabs (Frovatriptan Succinate) .... As Needed Headache 4)  Copaxone 20 Mg/ml Kit (Glatiramer Acetate) .... One Injection Daily 5)  Alprazolam 0.25 Mg Tabs (Alprazolam) .... 1/2 Tablet By Mouth Once Daily As Needed 6)  Iron 325 (65 Fe) Mg Tabs (Ferrous Sulfate) .... Once Daily 7)  Oscal 500/200 D-3 500-200 Mg-Unit Tabs (Calcium-Vitamin D) .[name].. 1 By Mouth Two Times A Day 8)  Vitamin D 1000 Unit Tabs (Cholecalciferol) .[name].. 1 By Mouth Once Daily 9)  Fluticasone Propionate 50 Mcg/act Susp (Fluticasone Propionate) .[name].. 1 Spray To Each Nostril Every Morning 10)  Claritin-D 12 Hour 5-120 Mg Xr12h-Tab (Loratadine-Pseudoephedrine) .[name].. 1 By Mouth  Every Morning X 10days, Then As Needed For Congestion and Allergy  Symptoms 11)  Fish Oil 1000 Mg Caps (Omega-3 Fatty Acids) .... Take 1 By Mouth Qd  Allergies (verified): 1)  ! Ace Inhibitors 2)  ! Biaxin  Past History:  Past Medical History: 1. Migraine Headache 2. Hypertension  3. Multiple Sclerosis 4. Obesity - metabolic syndrome 5. Allergic rhinitis   6. Angioedema - ACE inhibitor 7. GERD 8. 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 Atypical chest pain: myoview (11/07) showed EF 85%, no evidence for ischemia or infarction.  10.  Echo (12/07): EF 60%, no valvular abnormalities.   Review of Systems       The patient complains of headaches.  The patient denies anorexia, vision loss, decreased hearing, chest pain, and abdominal pain.    Physical Exam  General:  alert, well-developed, well-nourished, and cooperative to examination.   overweight-appearing.   Eyes:  vision grossly intact; pupils equal, round and reactive to light.  conjunctiva and lids normal.    Ears:  normal pinnae bilaterally, without erythema, swelling, or tenderness to palpation. TM hazy in left with mod clear effusion, no cerumen impaction. Hearing grossly normal bilaterally  Mouth:  teeth and gums in good repair; mucous membranes moist, without lesions or ulcers. oropharynx clear without exudate, no erythema. +PND Lungs:  normal respiratory effort, no intercostal retractions or use of accessory muscles; normal breath sounds bilaterally -  no crackles and no wheezes.    Heart:  normal rate, regular rhythm, no murmur, and no rub. BLE without edema.   Impression & Recommendations:  Problem # 1:  SINUSITIS (ICD-473.9)  The following medications were removed from the medication list:    Mucinex D 60-600 Mg Xr12h-tab (Pseudoephedrine-guaifenesin) .[name]... 1 by mouth two times a day as needed cough Her updated medication list for this problem includes:    Fluticasone Propionate 50 Mcg/act Susp (Fluticasone propionate) .[name]... 1 spray to each nostril every morning    Claritin-d 12 Hour  5-120 Mg Xr12h-tab (Loratadine-pseudoephedrine) .[name]... 1 by mouth  every morning x 10days, then as needed for congestion and allergy symptoms    Azithromycin 250 Mg Tabs (Azithromycin) .[name]... 2 tabs by mouth today, then 1 by mouth daily starting tomorrow  Take antibiotics for full duration. Discussed treatment options including indications for coronal CT scan of sinuses and ENT referral if continued recurrent problems despite preventitive tx   Orders: Prescription Created Electronically ([id number])  Complete Medication List: 1)  Hydrochlorothiazide 25 Mg Tabs (Hydrochlorothiazide) .... Take 1 tablet by mouth once a day 2)  Carvedilol 25 Mg Tabs (Carvedilol) .... Take 1 two times a day 3)  Frova 2.5 Mg Tabs (Frovatriptan succinate) .... As needed headache 4)  Copaxone 20 Mg/ml Kit (Glatiramer acetate) .... One injection daily 5)  Alprazolam 0.25 Mg Tabs (Alprazolam) .... 1/2 tablet by mouth once daily as needed 6)  Iron 325 (65 Fe) Mg Tabs (Ferrous sulfate) .... Once daily 7)  Oscal 500/200 D-3 500-200 Mg-unit Tabs (Calcium-vitamin d) .[name].. 1 by mouth two times a day 8)  Vitamin D 1000 Unit Tabs (Cholecalciferol) .[name].. 1 by mouth once daily 9)  Fluticasone Propionate 50 Mcg/act Susp (Fluticasone propionate) .[name].. 1 spray to each nostril every morning 10)  Claritin-d 12 Hour 5-120 Mg Xr12h-tab (Loratadine-pseudoephedrine) .[name].. 1 by mouth  every morning x 10days, then as needed for congestion and allergy symptoms 11)  Fish Oil 1000 Mg Caps (Omega-3 fatty acids) .... Take 1 by mouth qd 12)  Azithromycin 250 Mg Tabs (Azithromycin) .... 2 tabs by mouth today, then 1 by mouth daily starting tomorrow  Patient Instructions: 1)  it was good to see you today.  2)  azithromycin for your sinus infection - your prescriptions have been electronically submitted to your pharmacy. Please take as directed. Contact our office if you believe you're having problems with the medication(s). 3)  keep using the claritin d,  nasal spray and tylenol as you are doing Prescriptions: AZITHROMYCIN 250 MG TABS (AZITHROMYCIN) 2 tabs by mouth today, then 1 by mouth daily starting tomorrow  #6 x 1   Entered and Authorized by:   Newt Lukes MD   Signed by:   Newt Lukes MD on 01/14/2010   Method used:   Electronically to        [facility] [address] [phone]* (retail)       [address] [place], [place]  [address]       Ph: [phone]       Fax: [phone]   RxID:   [id number]

## 2010-10-18 NOTE — Progress Notes (Signed)
Summary: PA-Voltaren  Phone Note From Pharmacy   Summary of Call: PA-Voltaren, awaiting form from [facility] [phone]. Dagoberto Reef  June 09, 2010 12:53 PM  Pa faxed to [facility] @ [phone], awaiting approval. Dagoberto Reef  June 10, 2010 10:24 AM  Insurance Denied Voltaren because pt is using drug in combination with on oral NSAID (non-steroidal anti-inflammatory drug) or a COX II inhabitor and where the patient has experienced failure or intolerance with at least three oral NSAIDs, is currently on an anticoagulant or antiplatelet therapy, or is unable to take any oral drugs that require swallowing. Initial call taken by: Dagoberto Reef,  June 13, 2010 8:43 AM  Follow-up for Phone Call        please call pt - since her insurance denied voltaren gel, will change rx to generic voltaren pills for tx of her tennis elbow - erx done- Follow-up by: Newt Lukes MD,  June 13, 2010 9:28 AM  Additional Follow-up for Phone Call Additional follow up Details #1::        Called pt no ansew LMOM voltaren gel denied by insurance md sent in new rx [facility].  Additional Follow-up by: Orlan Leavens RMA,  June 13, 2010 12:06 PM    New/Updated Medications: DICLOFENAC SODIUM 50 MG TBEC (DICLOFENAC SODIUM) 1 by mouth two times a day x 7 days, then as needed for pain Prescriptions: DICLOFENAC SODIUM 50 MG TBEC (DICLOFENAC SODIUM) 1 by mouth two times a day x 7 days, then as needed for pain  #30 x 0   Entered and Authorized by:   Newt Lukes MD   Signed by:   Newt Lukes MD on 06/13/2010   Method used:   Electronically to        [facility] [address] [phone]* (retail)       [address] [place], [place]  [address]       Ph: [phone]       Fax: [phone]   RxID:   [id number]

## 2010-10-18 NOTE — Medication Information (Signed)
Summary: Denial/medco  Denial/medco   Imported By: Lester [place] 08/03/2010 08:20:32  _____________________________________________________________________  External Attachment:    Type:   Image     Comment:   External Document

## 2010-10-18 NOTE — Medication Information (Signed)
Summary: Protonix approved til 09/20/10/[employer]  Protonix approved til 09/20/10/[employer]   Imported By: Lester  09/23/2009 08:54:46  _____________________________________________________________________  External Attachment:    Type:   Image     Comment:   External Document

## 2010-10-18 NOTE — Progress Notes (Signed)
Summary: 90 day supply  Phone Note From Pharmacy   Caller: [facility] [place]. [phone]* Summary of Call: Pt is req a 90 day supply on her HCTZ and coreg 25mg . [facility] Initial call taken by: Orlan Leavens,  October 21, 2009 8:28 AM    Prescriptions: CARVEDILOL 25 MG TABS (CARVEDILOL) take 1 two times a day  #180 x 3   Entered by:   Orlan Leavens   Authorized by:   Newt Lukes MD   Signed by:   Orlan Leavens on 10/21/2009   Method used:   Electronically to        [facility] [address] [phone]* (retail)       [address] [place], [place]  [address]       Ph: [phone]       Fax: [phone]   RxID:   [id number] HYDROCHLOROTHIAZIDE 25 MG TABS (HYDROCHLOROTHIAZIDE) Take 1 tablet by mouth once a day  #90 x 3   Entered by:   Orlan Leavens   Authorized by:   Newt Lukes MD   Signed by:   Orlan Leavens on 10/21/2009   Method used:   Electronically to        [facility] [address] [phone]* (retail)       [address] [place], [place]  [address]       Ph: [phone]       Fax: [phone]   RxID:   [id number]

## 2010-10-18 NOTE — Progress Notes (Signed)
Summary: Passed a tape worm    Phone Note Call from Patient Call back at Home Phone [phone] Call back at or cell [phone]   Call For: Dr Marina Goodell Reason for Call: Talk to Nurse Summary of Call: Passed a tape worm last week- is there something she do? Initial call taken by: Leanor Kail [facility],  December 14, 2009 12:27 PM  Follow-up for Phone Call        Dr. Felicity Coyer has ordered O&P, please see phone note. Follow-up by: Lucious Groves,  December 14, 2009 1:29 PM  Additional Follow-up for Phone Call Additional follow up Details #1::        Pt. informed that she needs to get stool study as ordered by DR.Mady Haagensen and that she will be able to treat pt. if stool is positive. Additional Follow-up by: Teryl Lucy RN,  December 14, 2009 1:48 PM

## 2010-10-18 NOTE — Letter (Signed)
Summary: Out of Work  [facility]  [address] [place], [place] [address]   Phone: [phone]  Fax: [phone]    October 11, 2009   Employee:  JACQULYNN SHAPPELL    To Whom It May Concern:   For Medical reasons, please excuse the above named employee from work for the following dates:  Start: 10/11/09    End: 10/12/09, can return to work Tuesday    If you need additional information, please feel free to contact our office.         Sincerely,    Dr. Rene Paci

## 2010-10-18 NOTE — Assessment & Plan Note (Signed)
Summary: elbow pain/cd   Vital Signs:  Patient profile:   50 year old female Height:      65 inches (165.10 cm) Weight:      213.50 pounds (97.05 kg) BMI:     35.66 O2 Sat:      98 % on Room air Temp:     99.3 degrees F (37.39 degrees C) oral Pulse rate:   80 / minute BP sitting:   122 / 70  (left arm) Cuff size:   large  O2 Flow:  Room air CC: LT elbow pain radiating to forearm x 3 weeks   Primary Care Provider:  Newt Lukes MD  CC:  LT elbow pain radiating to forearm x 3 weeks.  History of Present Illness: c/o left elbow pain  onset 3 weeks denies repetitive activity or trauma no swelling or brusiing located lateral side of elbow pain radiates down forearm pain worst with supination no hx same   Current Medications (verified): 1)  Hydrochlorothiazide 25 Mg Tabs (Hydrochlorothiazide) .... Take 1 Tablet By Mouth Once A Day 2)  Carvedilol 25 Mg Tabs (Carvedilol) .... Take 1 Two Times A Day 3)  Frova 2.5 Mg  Tabs (Frovatriptan Succinate) .... As Needed Headache 4)  Copaxone 20 Mg/ml Kit (Glatiramer Acetate) .... One Injection Daily 5)  Alprazolam 0.25 Mg Tabs (Alprazolam) .... 1/2 Tablet By Mouth Once Daily As Needed 6)  Iron 325 (65 Fe) Mg Tabs (Ferrous Sulfate) .... Once Daily 7)  Vitamin D 1000 Unit Tabs (Cholecalciferol) .[name].. 1 By Mouth Once Daily 8)  Claritin-D 12 Hour 5-120 Mg Xr12h-Tab (Loratadine-Pseudoephedrine) .[name].. 1 By Mouth  Every Morning X 10days, Then As Needed For Congestion and Allergy Symptoms 9)  Nasonex 50 Mcg/act Susp (Mometasone Furoate) .... 2 Sprays Each Nostril Once Daily 10)  Azelastine Hcl 137 Mcg/spray Soln (Azelastine Hcl) .... 2 Sprays Each Nostril Two Times A Day 11)  Protonix 40 Mg Tbec (Pantoprazole Sodium) .[name].. 1 Tablet By Mouth Once Daily  Allergies (verified): 1)  ! Ace Inhibitors 2)  ! Biaxin  Past History:  Past Medical History: 1. Migraine Headache 2. Hypertension  3. Multiple Sclerosis 4. Obesity - metabolic  syndrome 5. Allergic rhinitis   6. Angioedema - ACE inhibitor 7. GERD 8. 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 Atypical chest pain: myoview (11/07) showed EF 85%, no evidence for ischemia or infarction 10.  Echo (12/07): EF 60%, no valvular abnormalities  Review of Systems  The patient denies fever, headaches, and muscle weakness.    Physical Exam  General:  alert, well-developed, well-nourished, and cooperative to examination.   overweight-appearing.   Lungs:  normal respiratory effort, no intercostal retractions or use of accessory muscles; normal breath sounds bilaterally - no crackles and no wheezes.    Heart:  normal rate, regular rhythm, no murmur, and no rub. BLE without edema. Msk:  left elbow: pain to palp over lateral epicondylitis, pain with supination>pronation, no bursa swelling Skin:  no rashes, vesicles, ulcers, or erythema. No nodules or irregularity to palpation.    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 Take 1 tablet by mouth once a day 2)  Carvedilol 25 Mg Tabs (Carvedilol) .... Take 1 two times a day 3)  Frova 2.5 Mg Tabs (Frovatriptan succinate) .... As needed headache 4)  Copaxone 20 Mg/ml Kit (Glatiramer acetate) .... One injection daily 5)  Alprazolam 0.25 Mg Tabs (Alprazolam) .... 1/2 tablet by mouth once daily as needed 6)  Iron 325 (65 Fe) Mg Tabs (Ferrous sulfate) .... Once daily 7)  Vitamin D 1000 Unit Tabs (Cholecalciferol) .[name].. 1 by mouth once daily 8)  Claritin-d 12 Hour 5-120 Mg Xr12h-tab (Loratadine-pseudoephedrine) .[name].. 1 by mouth  every morning x 10days, then as needed for congestion and allergy symptoms 9)  Nasonex 50  Mcg/act Susp (Mometasone furoate) .... 2 sprays each nostril once daily 10)  Azelastine Hcl 137 Mcg/spray Soln (Azelastine hcl) .... 2 sprays each nostril two times a day 11)  Protonix 40 Mg Tbec (Pantoprazole sodium) .[name].. 1 tablet by mouth once daily 12)  Voltaren 1 % Gel (Diclofenac sodium) .... Apply to affected area three times a day x 7 days, then as needed  Patient Instructions: 1)  it was good to see you today. 2)  use voltaren gel to painful elbow as discussed - your prescription has been electronically submitted to your pharmacy. Please take as directed. Contact our office if you believe you're having problems with the medication(s).  3)  pickup tennis elbow band and wear as directed on left forearm (below elbow) 4)  Please schedule a follow-up appointment as needed or as scheduled, call sooner if problems.  Prescriptions: VOLTAREN 1 % GEL (DICLOFENAC SODIUM) apply to affected area three times a day x 7 days, then as needed  #1 x 1   Entered and Authorized by:   Newt Lukes MD   Signed by:   Newt Lukes MD on 06/07/2010   Method used:   Electronically to        [facility] [address] [phone]* (retail)       [address] [place], [place]  [address]       Ph: [phone]       Fax: [phone]   RxID:   [id number]

## 2010-10-18 NOTE — Progress Notes (Signed)
Summary: question  Phone Note Call from Patient Call back at Home Phone [phone] Call back at [phone]   Summary of Call: Patient called stating that it has been 1 week, but she had a BM, and patient states she passed something that resembles a tape worm. Patient has had several BM since that time and they have all been normal. Should the patient be seen? Do stool studies? Or do you prefer she call her GI MD for appt? Please advise. Initial call taken by: Lucious Groves,  December 14, 2009 12:10 PM  Follow-up for Phone Call        pt can have stool sample checked - order for O&P placed - ?check with lab on how to arrange sample... thanks Follow-up by: Newt Lukes MD,  December 14, 2009 12:42 PM  Additional Follow-up for Phone Call Additional follow up Details #1::        Patient notified. Additional Follow-up by: Lucious Groves,  December 14, 2009 1:24 PM  New Problems: OTHER SYMPTOMS INVOLVING DIGESTIVE SYSTEM OTHER (ICD-787.99)   New Problems: OTHER SYMPTOMS INVOLVING DIGESTIVE SYSTEM OTHER (ICD-787.99)

## 2010-10-18 NOTE — Progress Notes (Signed)
Summary: Congestion  Phone Note Call from Patient Call back at Work Phone [phone]   Caller: Patient Summary of Call: pt called stating that she is still having severe sinus congestion with Claritin D. Pt is requesting additional medication. Initial call taken by: Margaret Pyle, CMA,  Jan 27, 2010 10:30 AM  Follow-up for Phone Call        change generic flonase to nasonex spray - needs to use this every day - also will add astelin nose spray - both prescriptions have been electronically submitted to  pharmacy.  Follow-up by: Newt Lukes MD,  Jan 27, 2010 11:31 AM  Additional Follow-up for Phone Call Additional follow up Details #1::        pt informed Additional Follow-up by: Margaret Pyle, CMA,  Jan 27, 2010 1:25 PM    New/Updated Medications: NASONEX 50 MCG/ACT SUSP (MOMETASONE FUROATE) 2 sprays each nostril once daily AZELASTINE HCL 137 MCG/SPRAY SOLN (AZELASTINE HCL) 2 sprays each nostril two times a day Prescriptions: AZELASTINE HCL 137 MCG/SPRAY SOLN (AZELASTINE HCL) 2 sprays each nostril two times a day  #1 x 2   Entered and Authorized by:   Newt Lukes MD   Signed by:   Newt Lukes MD on 01/27/2010   Method used:   Electronically to        [facility] [address] [phone]* (retail)       [address] [place], [place]  [address]       Ph: [phone]       Fax: [phone]   RxID:   [id number] NASONEX 50 MCG/ACT SUSP (MOMETASONE FUROATE) 2 sprays each nostril once daily  #1 x 2   Entered and Authorized by:   Newt Lukes MD   Signed by:   Newt Lukes MD on 01/27/2010   Method used:   Electronically to        [facility] [address] [phone]* (retail)       [address] [place], [place]  [address]       Ph: [phone]       Fax: [phone]   RxID:   [id number]

## 2010-10-18 NOTE — Assessment & Plan Note (Signed)
Summary: no fever/congestion/cd   Vital Signs:  Patient profile:   50 year old female Height:      67 inches (170.18 cm) Weight:      210.2 pounds (95.55 kg) O2 Sat:      97 % on Room air Temp:     97.4 degrees F (36.33 degrees C) oral Pulse rate:   86 / minute BP sitting:   120 / 78  (left arm) Cuff size:   regular  Vitals Entered By: Orlan Leavens (November 29, 2009 9:24 AM)  O2 Flow:  Room air CC: Congestion Is Patient Diabetic? No Pain Assessment Patient in pain? no        Primary Care Provider:  Newt Lukes MD  CC:  Congestion.  History of Present Illness: c/o nasal congestion - onset 4 days ago - a/w left ear fullness and "muffled" hearing -no pain in ear or head -no vision change some relief with OTC sudafed "when i remember to take it" no fever - occ nasal discharge - yellow - thick white chest sputum with AM cough  Current Medications (verified): 1)  Hydrochlorothiazide 25 Mg Tabs (Hydrochlorothiazide) .... Take 1 Tablet By Mouth Once A Day 2)  Carvedilol 25 Mg Tabs (Carvedilol) .... Take 1 Two Times A Day 3)  Frova 2.5 Mg  Tabs (Frovatriptan Succinate) .... As Needed Headache 4)  Copaxone 20 Mg/ml Kit (Glatiramer Acetate) .... One Injection Daily 5)  Alprazolam 0.25 Mg Tabs (Alprazolam) .... 1/2 Tablet By Mouth Once Daily As Needed 6)  Iron 325 (65 Fe) Mg Tabs (Ferrous Sulfate) .... Once Daily 7)  Remeron 15 Mg Tabs (Mirtazapine) .... One By Mouth At Bedtime For Depression Prn 8)  Mucinex D 60-600 Mg Xr12h-Tab (Pseudoephedrine-Guaifenesin) .[name].. 1 By Mouth Two Times A Day As Needed Cough 9)  Oscal 500/200 D-3 500-200 Mg-Unit Tabs (Calcium-Vitamin D) .[name].. 1 By Mouth Two Times A Day 10)  Vitamin D 1000 Unit Tabs (Cholecalciferol) .[name].. 1 By Mouth Once Daily 11)  Lidocaine Hcl 4 % Soln (Lidocaine Hcl) .... Apply To Affected Lip Area Three Times A Day As Needed For Pain Symptoms 12)  Fluticasone Propionate 50 Mcg/act Susp (Fluticasone Propionate) .[name].. 1 Spray  To Each Nostril Every Morning  Allergies (verified): 1)  ! Ace Inhibitors 2)  ! Biaxin  Past History:  Past Medical History: Reviewed history from 05/28/2009 and no changes required. 1. Migraine Headache 2. Hypertension  3. Multiple Sclerosis 4. Obesity - metabolic syndrome 5. Allergic rhinitis   6. Angioedema - ACE inhibitor 7. GERD 8. 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 Atypical chest pain: myoview (11/07) showed EF 85%, no evidence for ischemia or infarction.  10.  Echo (12/07): EF 60%, no valvular abnormalities.   Review of Systems  The patient denies anorexia, fever, hoarseness, chest pain, syncope, headaches, and hemoptysis.    Physical Exam  General:  alert, well-developed, well-nourished, and cooperative to examination.   overweight-appearing.   Eyes:  vision grossly intact; pupils equal, round and reactive to light.  conjunctiva and lids normal.    Ears:  normal pinnae bilaterally, without erythema, swelling, or tenderness to palpation. TMs clear, without effusion, or cerumen impaction. Hearing grossly normal bilaterally  Mouth:  teeth and gums in good repair; mucous membranes moist, without lesions or ulcers. oropharynx clear without exudate, no erythema. +PND Lungs:  normal respiratory effort, no intercostal retractions or use of accessory muscles; normal breath sounds bilaterally - no crackles and no wheezes.    Heart:  normal rate, regular  rhythm, no murmur, and no rub. BLE without edema. normal DP pulses and normal cap refill in all 4 extremities      Impression & Recommendations:  Problem # 1:  ALLERGIC RHINITIS (ICD-477.9)  Her updated medication list for this problem includes:    Fluticasone Propionate 50 Mcg/act Susp (Fluticasone propionate) .[name]... 1 spray to each nostril every morning  Discussed use of allergy medications and environmental measures.   Problem # 2:  URI (ICD-465.9)  Her updated medication list for this problem includes:    Mucinex D 60-600  Mg Xr12h-tab (Pseudoephedrine-guaifenesin) .[name]... 1 by mouth two times a day as needed cough    Claritin-d 12 Hour 5-120 Mg Xr12h-tab (Loratadine-pseudoephedrine) .[name]... 1 by mouth  every morning x 10days, then as needed for congestion and allergy symptoms  Orders: Prescription Created Electronically [id number])  Complete Medication List: 1)  Hydrochlorothiazide 25 Mg Tabs (Hydrochlorothiazide) .... Take 1 tablet by mouth once a day 2)  Carvedilol 25 Mg Tabs (Carvedilol) .... Take 1 two times a day 3)  Frova 2.5 Mg Tabs (Frovatriptan succinate) .... As needed headache 4)  Copaxone 20 Mg/ml Kit (Glatiramer acetate) .... One injection daily 5)  Alprazolam 0.25 Mg Tabs (Alprazolam) .... 1/2 tablet by mouth once daily as needed 6)  Iron 325 (65 Fe) Mg Tabs (Ferrous sulfate) .... Once daily 7)  Remeron 15 Mg Tabs (Mirtazapine) .... One by mouth at bedtime for depression prn 8)  Mucinex D 60-600 Mg Xr12h-tab (Pseudoephedrine-guaifenesin) .[name].. 1 by mouth two times a day as needed cough 9)  Oscal 500/200 D-3 500-200 Mg-unit Tabs (Calcium-vitamin d) .[name].. 1 by mouth two times a day 10)  Vitamin D 1000 Unit Tabs (Cholecalciferol) .[name].. 1 by mouth once daily 11)  Lidocaine Hcl 4 % Soln (Lidocaine hcl) .... Apply to affected lip area three times a day as needed for pain symptoms 12)  Fluticasone Propionate 50 Mcg/act Susp (Fluticasone propionate) .[name].. 1 spray to each nostril every morning 13)  Claritin-d 12 Hour 5-120 Mg Xr12h-tab (Loratadine-pseudoephedrine) .[name].. 1 by mouth  every morning x 10days, then as needed for congestion and allergy symptoms 14)  Azithromycin 250 Mg Tabs (Azithromycin) .... 2 tabs by mouth today, then 1 by mouth daily starting tomorrow  Patient Instructions: 1)  it was good to see you today.  2)  change Sudafed to Claritin D 12h one  every morning x 10daysm then as needed for allergy and congestion symptoms - 3)  If you start running any fever or have pain in ear, start Zpack -  otherwise, it does not appear necessary to use any anitbiotic at this time  4)  Please keep follow-up appointment as scheduled (3 months), sooner if problems.  Prescriptions: AZITHROMYCIN 250 MG TABS (AZITHROMYCIN) 2 tabs by mouth today, then 1 by mouth daily starting tomorrow  #6 x 0   Entered and Authorized by:   Newt Lukes MD   Signed by:   Newt Lukes MD on 11/29/2009   Method used:   Electronically to        [facility] [address] [phone]* (retail)       [address] [place], [place]  [address]       Ph: [phone]       Fax: [phone]   RxID:   [id number]

## 2010-10-18 NOTE — Progress Notes (Signed)
Summary: Claritin-D  Phone Note Call from Patient Call back at Work Phone [phone]   Caller: Patient Summary of Call: pt called stating that RX for Claritin D was not sent to pharmacy. Initial call taken by: Margaret Pyle, CMA,  November 29, 2009 10:50 AM    Prescriptions: CLARITIN-D 12 HOUR 5-120 MG XR12H-TAB (LORATADINE-PSEUDOEPHEDRINE) 1 by mouth  every morning x 10days, then as needed for congestion and allergy symptoms  #30 x 2   Entered by:   Margaret Pyle, CMA   Authorized by:   Newt Lukes MD   Signed by:   Margaret Pyle, CMA on 11/29/2009   Method used:   Electronically to        [facility] [address]. [phone]* (retail)       [address] [place], [place]  [address]       Ph: [phone]       Fax: [phone]   RxID:   [id number]

## 2010-10-18 NOTE — Progress Notes (Signed)
Summary: Triage   Phone Note Call from Patient Call back at [phone]   Caller: Patient Call For: Dr. Marina Goodell Reason for Call: Talk to Nurse Summary of Call: Taking Protonix and having a "choaking feeling" in her throat x3-4 days Initial call taken by: Karna Christmas,  April 22, 2010 11:41 AM  Follow-up for Phone Call        Pt has occasional feeling of fulllness in her throat.  She takes protonix 40mg  once daily.  I advised her to take her Protonix 20-30 minutes before breakfast, she was taking it after food.  Pt was not following a GERD diet so I advised her to follow the recommendations I will send her in the mail.  She has been eating alot of tomatoes and that is when the feeling started.  She will avoid tomatoes for now.  She is going to try Protonix twice daily for 2 weeks and call back with a condition update. Follow-up by: Chales Abrahams CMA Duncan Dull),  April 22, 2010 2:11 PM

## 2010-10-18 NOTE — Assessment & Plan Note (Signed)
Summary: lightheaded/diarrhea/cd   Vital Signs:  Patient profile:   50 year old female Height:      67 inches (170.18 cm) Weight:      206.4 pounds (93.82 kg) O2 Sat:      96 % on Room air Temp:     98.7 degrees F (37.06 degrees C) oral Pulse rate:   94 / minute BP sitting:   130 / 82  (left arm) Cuff size:   regular  Vitals Entered By: Orlan Leavens (October 11, 2009 10:03 AM)  O2 Flow:  Room air CC: Pt states yesterday she took a laxative she did have bowel movement, but she had 2 episodes were she felt lightheaded. pt states she did blank out for about 10 seconds, bust her lip. Also want to get FMLA form fill-out Is Patient Diabetic? No Pain Assessment Patient in pain? no        Primary Care Provider:  Newt Lukes MD  CC:  Pt states yesterday she took a laxative she did have bowel movement, but she had 2 episodes were she felt lightheaded. pt states she did blank out for about 10 seconds, and bust her lip. Also want to get FMLA form fill-out.  History of Present Illness: here with compliants of dizzy head and lightheaded feeling earlier this AM symptoms precipitated by use of laxative last PM - whole Mag citrate bottle also has been fasting with her church in recent days after ++large volume, loose stool  x 2 this AM, felt lightheaded -  while gettting up from toliet to go back to bed, fell forward against hamper - hit chin/lower lip no LOC or amnesia no HA, no CP -+abd cramping no fever or vommiting or rectal pain - denies vision changes or symptoms of MS flare problem made worse by tendency towards constipation denies any dizziness or lightheaded symptoms persisitng at this time  requests completion of FMLA for her dtr who takes pt to her neuro appts  Current Medications (verified): 1)  Hydrochlorothiazide 25 Mg Tabs (Hydrochlorothiazide) .... Take 1 Tablet By Mouth Once A Day 2)  Carvedilol 25 Mg Tabs (Carvedilol) .... Take 1 Two Times A Day 3)  Frova 2.5 Mg   Tabs (Frovatriptan Succinate) .... As Needed Headache 4)  Copaxone 20 Mg/ml Kit (Glatiramer Acetate) .... One Injection Daily 5)  Alprazolam 0.25 Mg Tabs (Alprazolam) .... 1/2 Tablet By Mouth Once Daily As Needed 6)  Iron 325 (65 Fe) Mg Tabs (Ferrous Sulfate) .... Once Daily 7)  Remeron 15 Mg Tabs (Mirtazapine) .... One By Mouth At Bedtime For Depression Prn 8)  Mucinex D 60-600 Mg Xr12h-Tab (Pseudoephedrine-Guaifenesin) .[name].. 1 By Mouth Two Times A Day As Needed Cough 9)  Oscal 500/200 D-3 500-200 Mg-Unit Tabs (Calcium-Vitamin D) .[name].. 1 By Mouth Two Times A Day 10)  Vitamin D 1000 Unit Tabs (Cholecalciferol) .[name].. 1 By Mouth Once Daily  Allergies (verified): 1)  ! Ace Inhibitors 2)  ! Biaxin  Past History:  Past Medical History: Last updated: 05/28/2009 1. Migraine Headache 2. Hypertension  3. Multiple Sclerosis 4. Obesity - metabolic syndrome 5. Allergic rhinitis   6. Angioedema - ACE inhibitor 7. GERD 8. 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 Atypical chest pain: myoview (11/07) showed EF 85%, no evidence for ischemia or infarction.  10.  Echo (12/07): EF 60%, no valvular abnormalities.   Review of Systems       The patient complains of weight loss.  The patient denies anorexia, fever, vision loss, chest pain, headaches,  muscle weakness, and difficulty walking.    Physical Exam  General:  alert, well-developed, well-nourished, and cooperative to examination.   overweight-appearing.   Eyes:  vision grossly intact; pupils equal, round and reactive to light.  conjunctiva and lids normal.    Mouth:  slight skin tear on inner lower lip, left side due to trauma - no bleeding or hematoma - teeth intact Lungs:  normal respiratory effort, no intercostal retractions or use of accessory muscles; normal breath sounds bilaterally - no crackles and no wheezes.    Heart:  normal rate, regular rhythm, no murmur, and no rub. BLE without edema. Neurologic:  alert & oriented X3 and cranial nerves  II-XII symetrically intact.  strength normal in all extremities, sensation intact to light touch, and gait normal. speech fluent without dysarthria or aphasia; follows commands with good comprehension.  Skin:  5 mm lac to left lower lip without brusing or edema, no active bleeding   Impression & Recommendations:  Problem # 1:  SYNCOPE (ICD-780.2) likely precipiated by vasovagal (in a/w defication) and poss dehydration (recent fasting) HD stable - cardiac and neuro exam benign - no major trauma to mouth - teeth intact reassurance provided and work excuse note for pt for today advised to maintain good hydration and use daily Miralax to prevent constipation requiring Mag citrate use  (she reports hx same - dizzy and lightheaded -when she uses Mag citrate in past)  Problem # 2:  MULTIPLE SCLEROSIS (ICD-340) followed by dohmeir for same- (neuro) requests completion of FMLA for her dtr due to dtr taking her to neuro visits form completed but i may not be adequate authority for this subject - Time spent with patient 25 minutes, more than 50% of this time was spent discussion over and completion of FMLA forms for pt's dtr  Complete Medication List: 1)  Hydrochlorothiazide 25 Mg Tabs (Hydrochlorothiazide) .... Take 1 tablet by mouth once a day 2)  Carvedilol 25 Mg Tabs (Carvedilol) .... Take 1 two times a day 3)  Frova 2.5 Mg Tabs (Frovatriptan succinate) .... As needed headache 4)  Copaxone 20 Mg/ml Kit (Glatiramer acetate) .... One injection daily 5)  Alprazolam 0.25 Mg Tabs (Alprazolam) .... 1/2 tablet by mouth once daily as needed 6)  Iron 325 (65 Fe) Mg Tabs (Ferrous sulfate) .... Once daily 7)  Remeron 15 Mg Tabs (Mirtazapine) .... One by mouth at bedtime for depression prn 8)  Mucinex D 60-600 Mg Xr12h-tab (Pseudoephedrine-guaifenesin) .[name].. 1 by mouth two times a day as needed cough 9)  Oscal 500/200 D-3 500-200 Mg-unit Tabs (Calcium-vitamin d) .[name].. 1 by mouth two times a day 10)  Vitamin  D 1000 Unit Tabs (Cholecalciferol) .[name].. 1 by mouth once daily  Patient Instructions: 1)  it was good to see you today.  2)  exam looks ok now but if you devlop any headache or vision change or other problems, you should call for re-evaluation or go to ER 3)  will work on [employer] forms as requested but these may be better served by your neurologist team (since i am not the primary provider of care for this condition) -  Our office will contact you regarding this form once completed 4)  work note for you completed for today as requested.

## 2010-10-18 NOTE — Assessment & Plan Note (Signed)
Summary: BLADDER INFECTION? /NWS   Vital Signs:  Patient profile:   50 year old female Height:      65 inches (165.10 cm) Weight:      213 pounds (96.82 kg) O2 Sat:      98 % on Room air Temp:     97.2 degrees F (36.22 degrees C) oral Pulse rate:   77 / minute BP sitting:   118 / 82  (left arm) Cuff size:   large  Vitals Entered By: Orlan Leavens RMA (August 19, 2010 11:02 AM)  O2 Flow:  Room air CC: UTI Is Patient Diabetic? No   Primary Care Provider:  Newt Lukes MD  CC:  UTI.  History of Present Illness: c/o UTI symptoms - onset 2 weeks ago - small vol freq voiding, no hematuria + hx same  reviewed chronic med issues: allergy and sinus symptoms  no recent flare in recent weeks no fever, no HA reports compliance with ongoing medical treatment and no changes in medication dose or frequency. denies adverse side effects related to current therapy.   MS - stable on current tx - no flares of MS - no med changes  Current Medications (verified): 1)  Hydrochlorothiazide 25 Mg Tabs (Hydrochlorothiazide) .... Take 1 Tablet By Mouth Once A Day 2)  Carvedilol 25 Mg Tabs (Carvedilol) .... Take 1 Two Times A Day 3)  Frova 2.5 Mg  Tabs (Frovatriptan Succinate) .... As Needed Headache 4)  Copaxone 20 Mg/ml Kit (Glatiramer Acetate) .... One Injection Daily 5)  Alprazolam 0.25 Mg Tabs (Alprazolam) .... 1/2 Tablet By Mouth Once Daily As Needed 6)  Iron 325 (65 Fe) Mg Tabs (Ferrous Sulfate) .... Once Daily 7)  Vitamin D 1000 Unit Tabs (Cholecalciferol) .[name].. 1 By Mouth Once Daily 8)  Claritin-D 12 Hour 5-120 Mg Xr12h-Tab (Loratadine-Pseudoephedrine) .[name].. 1 By Mouth  Every Morning X 10days, Then As Needed For Congestion and Allergy Symptoms 9)  Nasonex 50 Mcg/act Susp (Mometasone Furoate) .... 2 Sprays Each Nostril Once Daily 10)  Azelastine Hcl 137 Mcg/spray Soln (Azelastine Hcl) .... 2 Sprays Each Nostril Two Times A Day 11)  Prenatal Multivitamin-Ultra  Tabs (Prenatal  Vit-Dss-Fe Cbn-Fa) .... Take 1 By Mouth Once Daily  Allergies (verified): 1)  ! Ace Inhibitors 2)  ! Biaxin  Past History:  Past Medical History: 1. Migraine Headache 2. Hypertension  3. Multiple Sclerosis 4. Obesity - metabolic syndrome 5. Allergic rhinitis   6. Angioedema - ACE inhibitor 7. GERD  8. 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 Atypical chest pain: myoview (11/07) showed EF 85%, no evidence for ischemia or infarction 10.  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    Physical Exam  General:  alert, well-developed, well-nourished, and cooperative to examination.    Lungs:  normal respiratory effort, no intercostal retractions or use of accessory muscles; normal breath sounds bilaterally - no crackles and no wheezes.    Heart:  normal rate, regular rhythm, no murmur, and no rub. BLE without edema. Abdomen:  soft, non-tender, normal bowel sounds, no distention; no masses and no appreciable hepatomegaly or splenomegaly.     Impression & Recommendations:  Problem # 1:  UTI (ICD-599.0)  +Udip with hx same - tx emperic  UTI with cipro, also diflucan for anticipated yeast symptoms after abx tx Her updated medication list for this problem includes:    Ciprofloxacin Hcl 500 Mg Tabs (Ciprofloxacin hcl) .[name]... 1 by mouth two times a day x 5 days  Orders: UA Dipstick w/o Micro (manual) (16109) Prescription Created Electronically [phone])  Encouraged to push clear liquids, get enough rest, and take acetaminophen as needed. To be seen in 10 days if no improvement, sooner if worse.  Complete Medication List: 1)  Hydrochlorothiazide 25 Mg Tabs (Hydrochlorothiazide) .... Take 1 tablet by mouth once a day 2)   Carvedilol 25 Mg Tabs (Carvedilol) .... Take 1 two times a day 3)  Frova 2.5 Mg Tabs (Frovatriptan succinate) .... As needed headache 4)  Copaxone 20 Mg/ml Kit (Glatiramer acetate) .... One injection daily 5)  Alprazolam 0.25 Mg Tabs (Alprazolam) .... 1/2 tablet by mouth once daily as needed 6)  Iron 325 (65 Fe) Mg Tabs (Ferrous sulfate) .... Once daily 7)  Vitamin D 1000 Unit Tabs (Cholecalciferol) .[name].. 1 by mouth once daily 8)  Claritin-d 12 Hour 5-120 Mg Xr12h-tab (Loratadine-pseudoephedrine) .[name].. 1 by mouth  every morning x 10days, then as needed for congestion and allergy symptoms 9)  Nasonex 50 Mcg/act Susp (Mometasone furoate) .... 2 sprays each nostril once daily 10)  Azelastine Hcl 137 Mcg/spray Soln (Azelastine hcl) .... 2 sprays each nostril two times a day 11)  Prenatal Multivitamin-ultra Tabs (Prenatal vit-dss-fe cbn-fa) .... Take 1 by mouth once daily 12)  Ciprofloxacin Hcl 500 Mg Tabs (Ciprofloxacin hcl) .[name].. 1 by mouth two times a day x 5 days 13)  Diflucan 150 Mg Tabs (Fluconazole) .[name].. 1 by mouth once daily as needed for yeast symptoms  Patient Instructions: 1)  it was good to see you today. 2)  cipro for UTI and diflucan - your prescriptions have been electronically submitted to your pharmacy. Please take as directed. Contact our office if you believe you're having problems with the medication(s).  3)  use probiotic as discussed 4)  Please schedule a follow-up appointment in late Janurary 2012 for physical, call sooner if problems.  Prescriptions: DIFLUCAN 150 MG TABS (FLUCONAZOLE) 1 by mouth once daily as needed for yeast symptoms  #3 x 0   Entered and Authorized by:   Newt Lukes MD   Signed by:   Newt Lukes MD on 08/19/2010   Method used:   Electronically to        [facility] [address] [phone]* (retail)       [address] [place], [place]  [address]       Ph: [phone]       Fax: [phone]   RxID:   [id number] CIPROFLOXACIN  HCL 500 MG TABS (CIPROFLOXACIN HCL) 1 by mouth two times a day x 5 days  #10 x 0   Entered and Authorized by:   Newt Lukes MD   Signed by:   Newt Lukes MD on 08/19/2010   Method used:   Electronically to        [facility] [address] [phone]* (retail)       [address] [place], [place]  [address]       Ph: [phone]       Fax: [phone]   RxID:   [id number]    Orders Added: 1)  UA Dipstick w/o Micro (manual) [81002] 2)  Est. Patient Level IV [57322] 3)  Prescription Created Electronically [phone]    Laboratory Results   Urine Tests    Routine Urinalysis   Color: colorless Appearance: Clear Glucose: negative   (Normal Range: Negative) Bilirubin: negative   (Normal Range: Negative) Ketone: negative   (Normal Range: Negative) Spec. Gravity: <1.005   (Normal Range: 1.003-1.035) Blood: negative   (Normal Range: Negative) pH: 7.0   (Normal Range: 5.0-8.0) Protein: negative   (Normal Range: Negative) Urobilinogen: 0.2   (Normal Range: 0-1) Nitrite: negative   (Normal Range: Negative) Leukocyte Esterace: moderate   (Normal Range: Negative)

## 2010-10-18 NOTE — Progress Notes (Signed)
Summary: ABX?  Phone Note Call from Patient Call back at Work Phone [phone]   Caller: Patient Summary of Call: pt called stating that she is developing a sinus infection again. Pt says that MD gave her a written Rx for a Zpack in case infection came back but she lost RX. pt is requesting ABX to [facility] on [address], please advise. Initial call taken by: Margaret Pyle, CMA,  January 12, 2010 9:40 AM  Follow-up for Phone Call        i did do this - see 3/14 OV note - but will need ov to eval same as it has been over 1 month since her original problem -- thanks Follow-up by: Newt Lukes MD,  January 12, 2010 10:47 AM  Additional Follow-up for Phone Call Additional follow up Details #1::        pt informed and will call back and sch appt. Additional Follow-up by: Margaret Pyle, CMA,  January 12, 2010 11:04 AM

## 2010-10-18 NOTE — Miscellaneous (Signed)
Summary: [facility]  [facility]/ [facility]   Imported By: Lester  12/01/2009 09:36:16  _____________________________________________________________________  External Attachment:    Type:   Image     Comment:   External Document

## 2010-10-20 ENCOUNTER — Other Ambulatory Visit: Payer: Self-pay

## 2010-10-20 ENCOUNTER — Ambulatory Visit: Admit: 2010-10-20 | Payer: Self-pay | Admitting: Internal Medicine

## 2010-11-01 ENCOUNTER — Encounter (INDEPENDENT_AMBULATORY_CARE_PROVIDER_SITE_OTHER): Payer: BC Managed Care – PPO | Admitting: Internal Medicine

## 2010-11-01 ENCOUNTER — Encounter: Payer: Self-pay | Admitting: Internal Medicine

## 2010-11-01 ENCOUNTER — Other Ambulatory Visit: Payer: Self-pay | Admitting: Internal Medicine

## 2010-11-01 ENCOUNTER — Other Ambulatory Visit: Payer: BC Managed Care – PPO

## 2010-11-01 DIAGNOSIS — Z0000 Encounter for general adult medical examination without abnormal findings: Secondary | ICD-10-CM

## 2010-11-01 DIAGNOSIS — E785 Hyperlipidemia, unspecified: Secondary | ICD-10-CM

## 2010-11-01 LAB — CBC WITH DIFFERENTIAL/PLATELET
Eosinophils Relative: 2.9 % (ref 0.0–5.0)
MCV: 89.2 fl (ref 78.0–100.0)
Monocytes Absolute: 0.4 10*3/uL (ref 0.1–1.0)
Neutrophils Relative %: 54.8 % (ref 43.0–77.0)
Platelets: 283 10*3/uL (ref 150.0–400.0)
WBC: 6.5 10*3/uL (ref 4.5–10.5)

## 2010-11-01 LAB — HEPATIC FUNCTION PANEL
ALT: 15 U/L (ref 0–35)
Bilirubin, Direct: 0.1 mg/dL (ref 0.0–0.3)
Total Bilirubin: 0.9 mg/dL (ref 0.3–1.2)

## 2010-11-01 LAB — URINALYSIS, ROUTINE W REFLEX MICROSCOPIC
Bilirubin Urine: NEGATIVE
Nitrite: NEGATIVE
Specific Gravity, Urine: 1.01 (ref 1.000–1.030)
Total Protein, Urine: NEGATIVE
pH: 7 (ref 5.0–8.0)

## 2010-11-01 LAB — BASIC METABOLIC PANEL
BUN: 13 mg/dL (ref 6–23)
Chloride: 102 mEq/L (ref 96–112)
Creatinine, Ser: 1 mg/dL (ref 0.4–1.2)
GFR: 78.68 mL/min (ref >60.00)

## 2010-11-01 LAB — LIPID PANEL
Cholesterol: 213 mg/dL — ABNORMAL HIGH (ref 0–200)
HDL: 47.8 mg/dL (ref >39.00)
VLDL: 39 mg/dL (ref 0.0–40.0)

## 2010-11-01 LAB — LDL CHOLESTEROL, DIRECT: Direct LDL: 133 mg/dL

## 2010-11-02 ENCOUNTER — Ambulatory Visit: Payer: Self-pay

## 2010-11-03 ENCOUNTER — Ambulatory Visit
Admission: RE | Admit: 2010-11-03 | Discharge: 2010-11-03 | Disposition: A | Discharge status: Home / Self Care | Payer: BC Managed Care – PPO | Source: Ambulatory Visit | Attending: Obstetrics & Gynecology | Admitting: Obstetrics & Gynecology

## 2010-11-03 DIAGNOSIS — Z1239 Encounter for other screening for malignant neoplasm of breast: Secondary | ICD-10-CM

## 2010-11-07 ENCOUNTER — Other Ambulatory Visit: Payer: Self-pay | Admitting: Obstetrics & Gynecology

## 2010-11-07 DIAGNOSIS — R928 Other abnormal and inconclusive findings on diagnostic imaging of breast: Secondary | ICD-10-CM

## 2010-11-09 NOTE — Letter (Signed)
Summary: [facility]  [facility]   Imported By: Sherian Rein 11/04/2010 12:28:14  _____________________________________________________________________  External Attachment:    Type:   Image     Comment:   External Document

## 2010-11-09 NOTE — Assessment & Plan Note (Signed)
Summary: CPX/NWS #   Vital Signs:  Patient profile:   50 year old female Height:      65 inches (165.10 cm) Weight:      215.4 pounds (97.91 kg) O2 Sat:      96 % Temp:     98.9 degrees F (37.17 degrees C) oral Pulse rate:   75 / minute BP sitting:   130 / 86  (left arm) Cuff size:   large  Vitals Entered By: Orlan Leavens RMA (November 01, 2010 8:41 AM) CC: CPX Is Patient Diabetic? No Pain Assessment Patient in pain? no        Primary Care Provider:  Newt Lukes MD  CC:  CPX.  History of Present Illness: patient is here today for annual physical. Patient feels well and has no complaints.   also reviewed chronic med issues: allergy and sinus symptoms  no recent flare- no fever, no HA reports compliance with ongoing medical treatment and no changes in medication dose or frequency. denies adverse side effects related to current therapy.   MS - stable on current tx - no flares of MS - no med changes  HTN - reports compliance with ongoing medical treatment and no changes in medication dose or frequency. denies adverse side effects related to current therapy.   Preventive Screening-Counseling & Management  Alcohol-Tobacco     Alcohol drinks/day: <1     Alcohol Counseling: not indicated; patient does not drink     Smoking Status: never     Passive Smoke Exposure: no     Tobacco Counseling: not indicated; no tobacco use  Caffeine-Diet-Exercise     Diet Counseling: to improve diet; diet is suboptimal     Nutrition Referrals: no     Does Patient Exercise: yes     Type of exercise: walk     Exercise Counseling: to improve exercise regimen     Depression Counseling: not indicated; screening negative for depression  Safety-Violence-Falls     Seat Belt Counseling: not indicated; patient wears seat belts     Helmet Counseling: not applicable     Smoke Detectors: yes     Violence in the Home: no risk noted     Fall Risk Counseling: not indicated; no significant  falls noted  Clinical Review Panels:  Prevention   Last Mammogram:  historical (09/18/2008)   Last Pap Smear:  Interpretation Result:Negative for intraepithelial Lesion or Malignancy.    (09/22/2010)   Last Colonoscopy:  Done (04/02/2006)  Immunizations   Last Tetanus Booster:  Historical (09/18/2004)   Last Flu Vaccine:  Declined (06/24/2008)  Lipid Management   Cholesterol:  209 (09/30/2009)   LDL (bad choesterol):  126 (06/22/2008)   HDL (good cholesterol):  41.10 (09/30/2009)  CBC   WBC:  6.0 (09/30/2009)   RBC:  4.23 (09/30/2009)   Hgb:  13.0 (09/30/2009)   Hct:  39.2 (09/30/2009)   Platelets:  292.0 (09/30/2009)   MCV  92.7 (09/30/2009)   MCHC  33.2 (09/30/2009)   RDW  13.5 (09/30/2009)   PMN:  50.0 (09/30/2009)   Lymphs:  35.9 (09/30/2009)   Monos:  10.2 (09/30/2009)   Eosinophils:  3.2 (09/30/2009)   Basophil:  0.7 (09/30/2009)  Complete Metabolic Panel   Glucose:  69 (09/30/2009)   Sodium:  136 (09/30/2009)   Potassium:  3.6 (09/30/2009)   Chloride:  103 (09/30/2009)   CO2:  28 (09/30/2009)   BUN:  6 (09/30/2009)   Creatinine:  0.8 (09/30/2009)   Albumin:  3.8 (09/30/2009)   Total Protein:  7.5 (09/30/2009)   Calcium:  8.8 (09/30/2009)   Total Bili:  0.8 (09/30/2009)   Alk Phos:  64 (09/30/2009)   SGPT (ALT):  17 (09/30/2009)   SGOT (AST):  19 (09/30/2009)   Current Medications (verified): 1)  Hydrochlorothiazide 25 Mg Tabs (Hydrochlorothiazide) .... Take 1 Tablet By Mouth Once A Day 2)  Carvedilol 25 Mg Tabs (Carvedilol) .... Take 1 Two Times A Day 3)  Frova 2.5 Mg  Tabs (Frovatriptan Succinate) .... As Needed Headache 4)  Copaxone 20 Mg/ml Kit (Glatiramer Acetate) .... One Injection Daily 5)  Alprazolam 0.25 Mg Tabs (Alprazolam) .... 1/2 Tablet By Mouth Once Daily As Needed 6)  Iron 325 (65 Fe) Mg Tabs (Ferrous Sulfate) .... Once Daily 7)  Vitamin D 1000 Unit Tabs (Cholecalciferol) .[name].. 1 By Mouth Once Daily 8)  Claritin-D 12 Hour 5-120 Mg  Xr12h-Tab (Loratadine-Pseudoephedrine) .[name].. 1 By Mouth  Every Morning X 10days, Then As Needed For Congestion and Allergy Symptoms 9)  Nasonex 50 Mcg/act Susp (Mometasone Furoate) .... 2 Sprays Each Nostril Once Daily 10)  Azelastine Hcl 137 Mcg/spray Soln (Azelastine Hcl) .... 2 Sprays Each Nostril Two Times A Day 11)  Prenatal Multivitamin-Ultra  Tabs (Prenatal Vit-Dss-Fe Cbn-Fa) .... Take 1 By Mouth Once Daily 12)  Probiotic  Caps (Probiotic Product) .... Take 1 By Mouth Once Daily  Allergies (verified): 1)  ! Ace Inhibitors 2)  ! Biaxin  Past History:  Past Medical History: 1. Migraine Headache 2. Hypertension  3. Multiple Sclerosis 4. Obesity - metabolic syndrome 5. Allergic rhinitis   6. Angioedema - ACE inhibitor 7. GERD  8. 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 Atypical chest pain: myoview (11/07) showed EF 85%, no evidence for ischemia or infarction 10.  Echo (12/07): EF 60%, no valvular abnormalities  Md roster: gyn - kaplan neuro - dolhlmeir  Past Surgical History: Tubal ligation      Family History: Mother developed CAD in her 28s. - expired 2010-10-02 - CHF Family History Diabetes 1st degree relative Family History High cholesterol Family History Hypertension    Social History: Occupation:  Best boy, [employer] Single - brother staying with pt since mom's death 02-Oct-2010 Never Smoked Alcohol use-no    Illicit Drug Use - no  Review of Systems       see HPI above. I have reviewed all other systems and they were negative.   Physical Exam  General:  alert, well-developed, well-nourished, and cooperative to examination.    Head:  Normocephalic and atraumatic without obvious abnormalities. No apparent alopecia or balding. Eyes:  vision grossly intact; pupils equal, round and reactive to light.  conjunctiva and lids normal.    Ears:  normal pinnae bilaterally, without erythema, swelling, or tenderness to palpation. TMs clear, without effusion,  or cerumen impaction. Hearing grossly normal bilaterally  Nose:  External nasal examination shows no deformity or inflammation. Nasal mucosa are pink and moist without lesions or exudates. Mouth:  teeth and gums in good repair; mucous membranes moist, without lesions or ulcers. oropharynx clear without exudate, no erythema. Neck:  supple, full ROM, no masses, no carotid bruits, no cervical lymphadenopathy, and no neck tenderness.   Chest Wall:  No deformities, masses, or tenderness noted. Lungs:  normal respiratory effort, no intercostal retractions or use of accessory muscles; normal breath sounds bilaterally - no crackles and no wheezes.    Heart:  normal rate, regular rhythm, no murmur, and no rub. BLE without edema. Abdomen:  soft, non-tender,  normal bowel sounds, no distention; no masses and no appreciable hepatomegaly or splenomegaly.   Genitalia:  defer to gyn Msk:  No deformity or scoliosis noted of thoracic or lumbar spine.   Neurologic:  alert & oriented X3 and cranial nerves II-XII symetrically intact.  strength normal in all extremities, sensation intact to light touch, and gait normal. speech fluent without dysarthria or aphasia; follows commands with good comprehension.  Skin:  no rashes, vesicles, ulcers, or erythema. No nodules or irregularity to palpation.  Psych:  Oriented X3, memory intact for recent and remote, normally interactive, good eye contact, not anxious appearing, not depressed appearing, and not agitated.      Impression & Recommendations:  Problem # 1:  HEALTH MAINTENANCE EXAM (ICD-V70.0) Patient has been counseled on age-appropriate routine health concerns for screening and prevention. These are reviewed and up-to-date. Immunizations are up-to-date or declined. Labs and ECG reviewed.  Orders: EKG w/ Interpretation (93000) TLB-Lipid Panel (80061-LIPID) TLB-BMP (Basic Metabolic Panel-BMET) (80048-METABOL) TLB-CBC Platelet - w/Differential  (85025-CBCD) TLB-Hepatic/Liver Function Pnl (80076-HEPATIC) TLB-TSH (Thyroid Stimulating Hormone) (84443-TSH) TLB-Udip w/ Micro (81001-URINE)  Complete Medication List: 1)  Hydrochlorothiazide 25 Mg Tabs (Hydrochlorothiazide) .... Take 1 tablet by mouth once a day 2)  Carvedilol 25 Mg Tabs (Carvedilol) .... Take 1 two times a day 3)  Frova 2.5 Mg Tabs (Frovatriptan succinate) .... As needed headache 4)  Copaxone 20 Mg/ml Kit (Glatiramer acetate) .... One injection daily 5)  Alprazolam 0.25 Mg Tabs (Alprazolam) .... 1/2 tablet by mouth once daily as needed 6)  Iron 325 (65 Fe) Mg Tabs (Ferrous sulfate) .... Once daily 7)  Vitamin D 1000 Unit Tabs (Cholecalciferol) .[name].. 1 by mouth once daily 8)  Claritin-d 12 Hour 5-120 Mg Xr12h-tab (Loratadine-pseudoephedrine) .[name].. 1 by mouth  every morning x 10days, then as needed for congestion and allergy symptoms 9)  Nasonex 50 Mcg/act Susp (Mometasone furoate) .... 2 sprays each nostril once daily 10)  Azelastine Hcl 137 Mcg/spray Soln (Azelastine hcl) .... 2 sprays each nostril two times a day 11)  Prenatal Multivitamin-ultra Tabs (Prenatal vit-dss-fe cbn-fa) .... Take 1 by mouth once daily 12)  Probiotic Caps (Probiotic product) .... Take 1 by mouth once daily  Patient Instructions: 1)  it was good to see you today. 2)  test(s) ordered today - your results will becalled to you after review in 48-72 hours from the time of test completion; if any changes need to be made or there are abnormal results, you will be notified at that time 3)  look into weight watchers as discussed - it is important that you work on losing weight - monitor your diet and consume fewer calories such as less carbohydrates (sugar) and less fat. you also need to increase your physical activity level - start by walking for 10-20 minutes 3 times per week and work up to 30 minutes 4-5 times each week.  4)  medications reviewed - no changes at this time 5)  Please schedule a follow-up  appointment in 3-89months to recheck blood pressure and weight check, call sooner if problems.    Orders Added: 1)  EKG w/ Interpretation [93000] 2)  TLB-Lipid Panel [80061-LIPID] 3)  TLB-BMP (Basic Metabolic Panel-BMET) [80048-METABOL] 4)  TLB-CBC Platelet - w/Differential [85025-CBCD] 5)  TLB-Hepatic/Liver Function Pnl [80076-HEPATIC] 6)  TLB-TSH (Thyroid Stimulating Hormone) [84443-TSH] 7)  TLB-Udip w/ Micro [81001-URINE] 8)  Est. Patient 40-64 years [99396]   Immunization History:  Tetanus/Td Immunization History:    Tetanus/Td:  historical (09/18/2004)   Immunization  History:  Tetanus/Td Immunization History:    Tetanus/Td:  Historical (09/18/2004)    Pap Smear  Procedure date:  09/22/2010  Findings:      Interpretation Result:Negative for intraepithelial Lesion or Malignancy.

## 2010-11-10 ENCOUNTER — Ambulatory Visit
Admission: RE | Admit: 2010-11-10 | Discharge: 2010-11-10 | Disposition: A | Discharge status: Home / Self Care | Payer: BC Managed Care – PPO | Source: Ambulatory Visit | Attending: Obstetrics & Gynecology | Admitting: Obstetrics & Gynecology

## 2010-11-10 DIAGNOSIS — R928 Other abnormal and inconclusive findings on diagnostic imaging of breast: Secondary | ICD-10-CM

## 2010-11-17 ENCOUNTER — Telehealth: Payer: Self-pay | Admitting: Internal Medicine

## 2010-11-24 NOTE — Progress Notes (Signed)
Summary: Rx req?  Phone Note Call from Patient Call back at [facility] Phone [phone]   Caller: Patient Summary of Call: Pt called requesting a laxative that will help with excess fluid in her bowel. Pt reports loose stools (not diarrhea) and bloating. Pt states she can feel fluid in her abdomen. She uses Benefiber and Probiotics daily. [facility] [address] Initial call taken by: Margaret Pyle, CMA,  November 17, 2010 10:32 AM  Follow-up for Phone Call        continue probiotic and try changing benefiber to senokot-s at bedtime  Follow-up by: Newt Lukes MD,  November 17, 2010 12:08 PM  Additional Follow-up for Phone Call Additional follow up Details #1::        Pt advised of above Additional Follow-up by: Margaret Pyle, CMA,  November 17, 2010 3:49 PM    New/Updated Medications: SENOKOT S 8.6-50 MG TABS (SENNOSIDES-DOCUSATE SODIUM) 1 by mouth at bedtime

## 2010-12-21 ENCOUNTER — Telehealth: Payer: Self-pay

## 2010-12-21 DIAGNOSIS — E65 Localized adiposity: Secondary | ICD-10-CM

## 2010-12-21 NOTE — Telephone Encounter (Signed)
Pt advised of referral and will expect a call from [facility] with appt info 

## 2010-12-21 NOTE — Assessment & Plan Note (Signed)
Refer to nutritonist Wt Readings from Last 3 Encounters:  11/01/10 215 lb 6.4 oz (97.705 kg)  08/19/10 213 lb (96.616 kg)  06/07/10 213 lb 8 oz (96.843 kg)

## 2010-12-21 NOTE — Telephone Encounter (Signed)
ok 

## 2010-12-21 NOTE — Telephone Encounter (Signed)
Pt called requesting a referral to a Nutritionist

## 2010-12-27 ENCOUNTER — Other Ambulatory Visit: Payer: Self-pay | Admitting: Internal Medicine

## 2010-12-31 ENCOUNTER — Encounter: Payer: Self-pay | Admitting: Internal Medicine

## 2011-01-12 ENCOUNTER — Ambulatory Visit: Payer: BC Managed Care – PPO | Admitting: *Deleted

## 2011-01-16 ENCOUNTER — Ambulatory Visit (INDEPENDENT_AMBULATORY_CARE_PROVIDER_SITE_OTHER): Payer: BC Managed Care – PPO | Admitting: Internal Medicine

## 2011-01-16 ENCOUNTER — Encounter: Payer: Self-pay | Admitting: Internal Medicine

## 2011-01-16 VITALS — BP 124/82 | HR 82 | Temp 98.3°F | Ht 65.0 in | Wt 209.4 lb

## 2011-01-16 DIAGNOSIS — J301 Allergic rhinitis due to pollen: Secondary | ICD-10-CM

## 2011-01-16 DIAGNOSIS — J029 Acute pharyngitis, unspecified: Secondary | ICD-10-CM

## 2011-01-16 MED ORDER — AZITHROMYCIN 250 MG PO TABS: 250.0000 mg | ORAL_TABLET | Freq: Every day | ORAL | Status: AC

## 2011-01-16 NOTE — Patient Instructions (Signed)
It was good to see you today. If you develop worsening symptoms or fever, you can reconsider antibiotics (Zpak provided as requested), but it does not appear necessary to use antibiotics at this time. Use salt water gargle and tylenol, sleep with right ear up and continue ongoing allergy medications as reviewed Your prescription(s) have been submitted to your pharmacy. Please take as directed and contact our office if you believe you are having problem(s) with the medication(s). Good job on the weight loss - keep up the good work! Please keep schedule followup as planned, call sooner if problems.

## 2011-01-16 NOTE — Progress Notes (Signed)
  Subjective:    Patient ID: [name] [name], female    DOB: Feb 23, 1961, 50 y.o.   MRN: [id number]  HPI complains of sore throat associated with R ear discomfort Onset 3 days ago No fever, no sputum or nasal drainage - +sinus pressure (chronic) and sneezing Using allergy meds and OTC as prescribed   also reviewed chronic med issues: allergy and sinus symptoms  ongoing flare in past 2 weeks- no fever, no HA reports compliance with ongoing medical treatment and no changes in medication dose or frequency. denies adverse side effects related to current therapy.   MS - stable on current tx - no flares of MS - no med changes  HTN - reports compliance with ongoing medical treatment and no changes in medication dose or frequency. denies adverse side effects related to current therapy.   Past Medical History  Diagnosis Date  . METABOLIC SYNDROME X   . OBESITY, TRUNCAL   . CLAUSTROPHOBIA   . CONSTIPATION, CHRONIC   . KELOID   . Multiple sclerosis   . Allergic rhinitis due to other allergen   . LATERAL EPICONDYLITIS, LEFT   . HYPERTENSION   . HYPERLIPIDEMIA   . GERD      Review of Systems  Constitutional: Positive for fatigue. Negative for fever.  Respiratory: Negative for cough and shortness of breath.   Cardiovascular: Negative for chest pain.  Neurological: Negative for headaches.       Objective:   Physical Exam BP 124/82  Pulse 82  Temp(Src) 98.3 F (36.8 C) (Oral)  Ht 5\' 5"  (1.651 m)  Wt 209 lb 6.4 oz (94.983 kg)  BMI 34.85 kg/m2  SpO2 98%  Physical Exam  Constitutional: She is oriented to person, place, and time. She appears well-developed and well-nourished. No distress.  HENT: Head: Normocephalic and atraumatic.  Ears: R TM hazy with effusion, no erythema - L TM clear. Nose: Nose normal.  Mouth/Throat: Oropharynx is clear and moist. No oropharyngeal exudate but cobblestoning present  Eyes: Conjunctivae and EOM are normal. Pupils are equal, round, and  reactive to light. No scleral icterus.  Neck: Normal range of motion. Neck supple. No JVD present. No thyromegaly present.  Cardiovascular: Normal rate, regular rhythm and normal heart sounds.  No murmur heard. Pulmonary/Chest: Effort normal and breath sounds normal. No respiratory distress. She has no wheezes.  Psychiatric: She has a normal mood and affect. Her behavior is normal. Judgment and thought content normal.       Wt Readings from Last 3 Encounters:  01/16/11 209 lb 6.4 oz (94.983 kg)  11/01/10 215 lb 6.4 oz (97.705 kg)  08/19/10 213 lb (96.616 kg)     Assessment & Plan:  Viral pharyngitis and R ear serous OM - suspect allergic component exac by viral URI - explained lack of efficacy of antibiotics in viral dz, but given underlying MS and immunesuppresant tx, will provide Zpak to use if symptoms worse (fever, discolored sputum, persisting/worsening pain in ear) - conitnue claritin-d, nasonex and symptomatic care (tylenol. Salt water gargle)

## 2011-01-24 ENCOUNTER — Ambulatory Visit: Payer: BC Managed Care – PPO | Admitting: *Deleted

## 2011-01-27 ENCOUNTER — Telehealth: Payer: Self-pay | Admitting: Internal Medicine

## 2011-01-27 NOTE — Telephone Encounter (Signed)
Not sure, but doubt it. She can make an office visit for a more formal evaluation, if she likes

## 2011-01-27 NOTE — Telephone Encounter (Signed)
Pt takes Protonix 40mg  daily. She has been having problems with her stomach being bloated. Pts pharmacist told her perhaps the Protonix could be causing this. Pt wants to know if that is possibly the cause and if it is can she possibly switch to something else. Dr. Marina Goodell please advise.

## 2011-01-27 NOTE — Telephone Encounter (Signed)
Pt aware of Dr. Perry's recommendations. 

## 2011-01-31 ENCOUNTER — Ambulatory Visit: Payer: BC Managed Care – PPO | Admitting: Internal Medicine

## 2011-01-31 NOTE — Assessment & Plan Note (Signed)
[facility]                            CARDIOLOGY OFFICE NOTE   NAME:[name] [name], IZELLA                   MRN:          [id number]  DATE:03/21/2007                            DOB:          1960-10-02    PRIMARY CARE PHYSICIAN:  Corwin Levins, M.D.   REASON FOR VISIT:  Follow up of her hypertension.   HISTORY OF THE PRESENT ILLNESS:  This patient comes in for a routine  visit.  She has had no problems with chest pain or dyspnea.  The blood  pressure remains uncontrolled despite medication adjustments.  She  reports being compliant with her medications, although does admit  thereafter she has not been trying to restrict salt in her diet; we  talked about this today.  She has not had a follow up BMET as of yet.  We talked about some additional medication adjustments going forward.   ALLERGIES:  ACE INHIBITORS.   MEDICATIONS:  Present medications include:  1. Frova 2.5 mg by mouth as needed.  2. Copaxone injections for multiple sclerosis.  3. Rebif injections three times a week for multiple sclerosis.  4. Toprol XL 50 mg by mouth twice a day.  5. Hydrochlorothiazide 25 mg by mouth daily.  6. Potassium chloride 10 mEq by mouth daily.   REVIEW OF SYSTEMS:  The review of systems is as described in the history  of present illness.   PHYSICAL EXAMINATION:  VITAL SIGNS:  On examination blood pressure is  159/100, heart rate 102 and weight 214 pounds.  GENERAL APPEARANCE:  The patient is comfortable and in no acute  distress.  HEENT:  The head, eyes, ears, nose and throat are normal.  NECK:  The neck is supple.  No elevated jugular venous pressure and  without bruits.  No thyroid tenderness and no thyromegaly.  LUNGS:  The lungs are clear without trouble breathing.  HEART:  Cardiac exam shows a regular rate and rhythm.  No S3 gallop.  No  pericardial rub.  ABDOMEN: The abdomen is soft and nontender.  EXTREMITIES:  The extremities exhibit no pitting  edema.  Distal pulses  are 2+.  SKIN:  The skin is warm and dry.  MUSCULOSKELETAL:  No kyphosis is noted.  NEUROPSYCHIATRIC:  The patient is alert, awake and oriented times three.   IMPRESSION:  1. Hypertension, not optimally controlled.  Reviewed salt restriction      and weight loss with the patient.  We will plan to add Norvasc      beginning at 5 mg daily to the present regimen as well as follow up      with a BMET.  She will have a blood pressure check over the next      few weeks.  I will plan to see her back over the next few months.      We will continue medication      adjustments.  2. Further plans deferred until follow up.     Jonelle Sidle, MD  Electronically Signed    SGM/MedQ  DD: 03/21/2007  DT: 03/22/2007  Job #: [id number]   cc:  Biagio Borg, MD

## 2011-01-31 NOTE — Assessment & Plan Note (Signed)
[facility]                            CARDIOLOGY OFFICE NOTE   NAME:[name], [name]                   MRN:          [id number]  DATE:01/08/2008                            DOB:          [birth year]    This is a patient of Dr. Simona Huh.   [name] [name] is a 50 year old African American female patient of Dr.  Diona Browner that has a history of atypical chest pain and hypertension.  She had a perfusion scan in November 2007 that was normal, ejection  fraction of 85% and a 2-D echo that showed no evidence of mitral valve  prolapse.   She saw Dr. Artist Pais yesterday because of pressure in her chest and back and  was told she had gastroesophageal reflux disease and was started on  Protonix.  She was also told to stop BC powder.  She comes in today with  the same pain.  She says it is mostly in her right upper back with deep  inspiration.  It has been constant for 3-4 days without relief.  She can  walk three and a half miles without any change in her symptoms.  She  says she does have a lot of gas and has tried taking Alka-Seltzer  and  Pepcid and other over-the-counter medications that have not helped.  On  further questioning, she finally admits that she was in the urgent care  last week and was diagnosed with an upper respiratory infection and  placed on a Z-Pak.  She denies any exertional chest tightness, pressure  shortness of breath, dizziness or presyncope.   CURRENT MEDICATIONS:  1. Hydrochlorothiazide 25 mg daily.  2. Coreg 25 mg b.i.d.  3. Flexeril 5 mg p.r.n.  4. Frova 2.5 mg.  5. Glucophage 500 mg b.i.d.  6. Protonix 40 mg daily.   PHYSICAL EXAMINATION:  GENERAL APPEARANCE:  This is a pleasant 46-year-  old African American female in no acute distress.  VITAL SIGNS:  Blood pressure 130/87, pulse 84, weight 215.  NECK:  Without JVD, HJR, bruit or thyroid enlargement.  LUNGS:  Clear anterior, posterior and lateral without wheezing, rales or  rhonchi.  HEART:  Regular rate and rhythm at 80 beats per minute, normal S1 and  S2.  No murmur, rub, bruit, thrill or heave noted.  ABDOMEN:  Soft without organomegaly, masses, lesions or abnormal  tenderness.  EXTREMITIES:  Without cyanosis, clubbing or edema.  She has good distal  pulses.   EKG normal sinus rhythm.  No acute change.   IMPRESSION:  1. Right chest and back pain, worse with deep inspiration.  2. Recent upper respiratory infection.  3. Gastroesophageal reflux disease.  4. History of atypical chest pain with negative Myoview in November      2007.  5. Hypertension.  6. Obesity with metabolic syndrome, to start on Glucophage.  7. Multiple sclerosis.  8. Migraine headaches.   PLAN:  I will check a chest x-ray just to make sure there is no residual  infection.  Otherwise, I do not believe this is cardiac chest pain and I  have asked her to follow up with  Dr. Artist Pais for further evaluation of this.  I also asked her stop taking the Alka-Seltzer because of the aspirin  component.      Jacolyn Reedy, PA-C  Electronically Signed      Luis Abed, MD, [facility]  Electronically Signed   ML/MedQ  DD: 01/08/2008  DT: 01/08/2008  Job #: [id number]   cc:   Barbette Hair. Artist Pais, DO  Jonelle Sidle, MD

## 2011-02-01 ENCOUNTER — Ambulatory Visit: Payer: BC Managed Care – PPO | Admitting: Internal Medicine

## 2011-02-03 NOTE — Procedures (Signed)
NAME:  [name] [name], RANSOM NO.:  [id number]   MEDICAL RECORD NO.:  [id number]          PATIENT TYPE:  OUT   LOCATION:  SLEEP CENTER                 FACILITY:  [facility]   PHYSICIAN:  Clinton D. Maple Hudson, M.D. DATE OF BIRTH:  1961-03-28   DATE OF STUDY:  11/30/2004                              NOCTURNAL POLYSOMNOGRAM   REFERRING PHYSICIAN:  Donia Guiles, MD   INDICATION FOR STUDY:  Hypersomnia with sleep apnea. Epworth sleepiness  score 11/24, BMI 33, weight 210 pounds. NPSG protocol was requested.   SLEEP ARCHITECTURE:  Total sleep time 283 minutes with sleep efficiency 78%.  Stage 1 was 8%, stage 2 80%, stages 3 and 4 were absent, REM was 12% of  total sleep time. Latency to sleep onset 11 minutes, latency to REM 175  minutes, awake after sleep onset 67 minutes, arousal index 1.7. No sleep  medications were reported.   RESPIRATORY DATA:  NPSG protocol. Respiratory disturbance index (RDI) 1.1  per hour which is within normal limits. This included 5 hypopneas. Events  were not positional. REM RDI was 5.5 indicating most of these events were  associated with REM.   OXYGEN DATA:  Loud to moderate snoring noted occasionally with an oxygen  desaturation nadir of 88%. Mean oxygen saturation through the study was 97-  98% on room air.   CARDIAC DATA:  Normal sinus rhythm.   MOVEMENT/PARASOMNIA:  No significant leg jerks or other behavior  abnormality. The patient felt the thermistor at her nose caused  claustrophobia.   IMPRESSION/RECOMMENDATION:  1.  No significant sleep disordered breathing.  2.  Normal cardiorespiratory patterns.  3.  No movement abnormality recorded.      CDY/MEDQ  D:  12/04/2004 12:31:19  T:  12/05/2004 10:03:50  Job:  [id number]

## 2011-02-03 NOTE — Assessment & Plan Note (Signed)
[facility]                                   ON-CALL NOTE   NAME:[name] [name], [name]                   MRN:          [id number]  DATE:07/19/2006                            DOB:          25-Jul-1961    TIME OF INTERACTION:  6:18 p.m.   CALLER:  Ollen Gross at [place].  Phone number [phone].   SUBJECTIVE:  The patient was supposed to have an antibiotic and medicine  called in for bronchitis.  Nothing has been called in.   OBJECTIVE:  Possible bronchitis.   PLAN:  I told the pharmacist that I cannot call in an antibiotic without  seeing the patient which I obviously have not done and have no idea what  else she might need.  The patient should call Dr. Jonny Ruiz in the morning if the  prescription does not get there this evening.   PRIMARY CARE PHYSICIAN:  Corwin Levins, M.D., home office [place].    ______________________________  Arta Silence, MD    RNS/MedQ  DD: 07/19/2006  DT: 07/20/2006  Job #: [id number]   cc:   Corwin Levins, MD

## 2011-02-03 NOTE — Assessment & Plan Note (Signed)
[facility]                           GASTROENTEROLOGY OFFICE NOTE   NAME:[name] [name], [name] [name]                   MRN:          [id number]  DATE:03/27/2006                            DOB:          1960-10-21    REFERRING PHYSICIAN:  Osvaldo Shipper SPRUILL   REFERRING PHYSICIAN:  Dr. Kevin Fenton Spruill   REASON FOR CONSULTATION:  Abdominal bloating, reflux symptoms, and  constipation.   HISTORY:  This is a pleasant 50 year old African-American female with a  history of hypertension, chronic headaches, and multiple sclerosis who is  referred through the courtesy of Dr. Shana Chute regarding the above-listed  abdominal complaints.  First, patient reports chronic problems with  constipation dating back many years.  She averages one bowel movement per  week and requires magnesium citrate approximately once every two weeks to  assist with bowel movements.  As well, she has had chronic problems with  bloating, though she feels this has been worse over the past month.  She  mentions some protuberance of her lower abdomen.  Next, she has had  intermittent problems with heartburn and indigestion which have been daily  over the past three weeks.  She has taken Pepcid Complete without relief.  No dysphagia or overall weight loss.  Associated with this has been stabbing  epigastric discomfort with some radiation into the chest.  Also, increased  belching or burping.  She denies melena, hematochezia, or diarrhea.  She  denies having had prior GI evaluation.   PAST MEDICAL HISTORY:  1.  Hypertension.  2.  Chronic headaches.  3.  Multiple sclerosis.   PAST SURGICAL HISTORY:  Tubal ligation.   ALLERGIES:  ACE INHIBITORS.   CURRENT MEDICATIONS:  1.  Toprol XL 50 mg daily.  2.  Hydrochlorothiazide 12.5 mg daily.  3.  Frova 2.5 mg p.r.n. migraines.  4.  Goody's powders once daily for headaches.  5.  Copaxone injections once daily for multiple sclerosis.   FAMILY  HISTORY:  Sister with a history of colon polyps.  No colon cancer.  Mother with diabetes and heart disease.   SOCIAL HISTORY:  The patient is single with two children and lives alone.  She has an 11th grade education and works as a Science writer for [employer].  She does not smoke or use alcohol.   REVIEW OF SYSTEMS:  Per diagnostic evaluation form.   PHYSICAL EXAMINATION:  GENERAL:  Well-appearing female in no acute distress.  VITAL SIGNS:  Blood pressure 172/102, heart rate is 100 and regular, weight  is 216.6 pounds.  She is 5 feet 5 inches in height.  HEENT:  Sclerae are anicteric.  Conjunctivae are pink.  Oral mucosa is  intact.  No adenopathy.  LUNGS:  Clear.  HEART:  Regular.  ABDOMEN:  Soft with some tenderness in the epigastric region.  Good bowel  sounds heard.  No organomegaly, mass, or hernia, though there is some slight  weakness in the abdominal musculature just above the umbilicus.  EXTREMITIES:  Without edema.   IMPRESSION:  1.  Gastroesophageal reflux disease without alarm symptoms.  2.  Chronic constipation associated  with bloating.  3.  Family history of colon polyps.  4.  General medical problems.   RECOMMENDATIONS:  1.  Nexium 40 mg p.o. daily.  Samples as well as a prescription with      multiple refills has been provided.  2.  Information on gastroesophageal reflux disease including reflux      precautions with attention to weight loss.  3.  Schedule colonoscopy to evaluate constipation, bloating, abdominal      discomfort, and provide neoplasia screening in this African-American      with a first-degree relative with a history of colon polyps.  Ashby Dawes of      the procedure as well as risks, benefits, and alternatives were reviewed      in detail.  She understood and agreed to proceed.  4.  Consider Miralax therapy for chronic constipation after colonoscopy      results known.                                   [name].  Eda Keys., MD   JNP/MedQ  DD:  03/27/2006  DT:  03/27/2006  Job #:  [id number]   cc:   Corwin Levins, MD  Ilda Mori, MD

## 2011-02-03 NOTE — Assessment & Plan Note (Signed)
[facility]                            CARDIOLOGY OFFICE NOTE   NAME:[name] [name], [name] [name]                   MRN:          [id number]  DATE:01/16/2007                            DOB:          19-Apr-1961    PRIMARY CARE PHYSICIAN:  Dr. Oliver Barre.   REASON FOR VISIT:  High blood pressure.   HISTORY OF PRESENT ILLNESS:  I last saw [name] [name] back in November of  2007.  She underwent a cardiac evaluation with a history of atypical  chest pain, hypertension, that revealed normal left ventricular systolic  function with no evidence of mitral valve prolapse, and normal perfusion  imaging with an ejection fraction of 85% on Myoview.  She comes in today  stating that her blood pressure has been elevated the last several  weeks.  She states that she has been checking it in the drug store, and  has had readings such as 150/94.  She has not had any adjustment in  her baseline medications.  Her electrocardiogram today showed sinus  tachycardia at 101 beats per minute with nonspecific ST-T wave changes  and no marked changes in comparison with the previous tracing.  She has  not had any progressive chest pain or palpitations.   ALLERGIES:  ACE INHIBITORS.   PRESENT MEDICATIONS:  1. Toprol XL 50 mg p.o. daily.  2. Goody's Powders.  3. Hydrochlorothiazide 12.5 mg p.o. daily.  4. Frova 2.5 mg p.o. p.r.n.  5. Copaxone injections for multiple sclerosis.   REVIEW OF SYSTEMS:  As described in the history of present illness.  Otherwise, negative.   EXAMINATION:  Blood pressure is 178/97, heart rate is 102.  No weight  obtained.  The patient is comfortable and in no acute distress.  HEENT:  Conjunctivae look normal.  Oropharynx clear.  NECK:  Supple.  No elevated jugular venous pressure or loud bruits.  No  thyromegaly is noted.  LUNGS:  Clear without labored breathing at rest.  CARDIAC:  Regular rate and rhythm.  No S3 gallop or pericardial rub.  ABDOMEN:   Soft and nontender.  No bruits.  Bowel sounds present.  EXTREMITIES:  Exhibit no pitting edema.  Distal pulses are 2+.  SKIN:  Warm and dry.  NEURO/PSYCH:  The patient is alert and oriented x3.   IMPRESSION/RECOMMENDATIONS:  1. Hypertension, not optimally controlled.  I have asked [name] [name]      to increase her Toprol XL to 50 mg p.o. b.i.d. as well as increase      her hydrochlorothiazide at 25 mg p.o. daily along with a low-dose      potassium supplement at 10 mEq p.o. daily.  Prescriptions were      provided.  She will return for a blood pressure check in 2 weeks,      at which time she will also have a BMET.  I will plan to see her      back in 1 month's time and we can hopefully get her medications      titrated adequately and get her back to Dr. Jonny Ruiz for long-term  followup.  2. Further plan is to follow.     Jonelle Sidle, MD  Electronically Signed    SGM/MedQ  DD: 01/16/2007  DT: 01/16/2007  Job #: [id number]   cc:   Corwin Levins, MD

## 2011-02-03 NOTE — Assessment & Plan Note (Signed)
[facility]                              CARDIOLOGY OFFICE NOTE   NAME:[name] [name], [name] [name]                   MRN:          [id number]  DATE:08/03/2006                            DOB:          04/26/1961    PRIMARY CARE PHYSICIAN:  Corwin Levins, M.D.   REASON FOR VISIT:  Chest pain.   HISTORY OF PRESENT ILLNESS:  [name] [name] is a 50 year old woman with  available history indicating hypertension, chronic headaches, and multiple  sclerosis.  She also has a history of gastroesophageal reflux disease and  has been seen by Dr. Marina Goodell in the past.  She presents today describing a 1  to 2-week history of intermittent left sided chest discomfort under her left  breast that is described with words ranging from sharp to dull to nagging.  She states that she thought this was gas pain or reflux, although was  concerned as it persisted despite use of Alka-Seltzer and presented today to  discuss it further.  She reports undergoing treadmill testing approximately  4 years ago that was apparently reassuring and also undergoing a prior  echocardiogram demonstrating mitral valve prolapse.  She has not had any  problems with palpitations, orthopnea, PND, or syncope and otherwise states  that she has been feeling fairly well.  She does not report a purely  exertional component to her symptoms, cannot identify any other aggravating  or alleviating factors.   Her electrocardiogram today shows a sinus rhythm at 90 beats per minute.   ALLERGIES:  LOTENSIN (THROAT SWELLING).   PRESENT MEDICATIONS:  1. Toprol XL 50 mg p.o. every day.  2. Copaxone injections.   PAST MEDICAL HISTORY:  As outlined in the history of present illness.  She  is status post tubal ligation.   SOCIAL HISTORY:  The patient has two children.  She works as a Science writer  for [employer].  There is no tobacco use history or alcohol use  history.  She drinks 1-2 caffeinated beverages a  month.  She is not  exercising regularly at this point.   FAMILY HISTORY:  Significant for coronary disease, reportedly in the  patient's mother who developed this in her 57s.   REVIEW OF SYSTEMS:  As described in the history of present illness.  She has  fatigue and has had some problems with chronic constipation, also seasonal  allergies.  All other systems are negative.   PHYSICAL EXAMINATION:  VITAL SIGNS:  Blood pressure is 152/86, heart rate is  90, weight is 218 pounds.  GENERAL:  The patient is comfortable and in no acute distress.  HEENT:  Conjunctivae and lids normal.  Pharynx is clear.  NECK:  Supple without elevated jugular venous pressure or loud bruits.  No  thyromegaly is noted.  LUNGS:  Clear without labored breathing at rest.  CARDIAC:  Reveals a regular rate and rhythm without loud murmur, S3 gallop.  There is no pericardial rub.  ABDOMEN:  Soft, nontender.  Normoactive bowel sounds.  No hepatomegaly.  EXTREMITIES:  Showed no significant pitting edema.  Distal pulses are 2+.  SKIN:  No  ulcer changes noted.  MUSCULOSKELETAL:  No kyphosis noted.  NEUROPSYCHIATRIC:  The patient is alert and oriented x3.  Affect is normal.   IMPRESSIONS/RECOMMENDATIONS:  Chest pain as outlined above, somewhat  atypical in description.  This is in the setting of reported prior history  of mitral valve prolapse.  There is also a potential family history of  premature cardiovascular disease as well as ongoing hypertension.  Our plan  will be an echocardiogram to follow up on her mitral valve status and also  an exercise Myoview for further ischemic evaluation.  If these studies are  stable and low risk, I would not necessarily anticipate any further ischemic  evaluation at this point and have her followup with Dr. Jonny [name] to consider  other possible etiologies.  Otherwise we will see her back and can proceed  from there.  Further plans to follow.     Jonelle Sidle, MD   Electronically Signed    SGM/MedQ  DD: 08/03/2006  DT: 08/03/2006  Job #: [id number]   cc:   Corwin Levins, MD

## 2011-02-28 ENCOUNTER — Telehealth: Payer: Self-pay | Admitting: *Deleted

## 2011-02-28 NOTE — Telephone Encounter (Signed)
Pt informed of MD's advisement. 

## 2011-02-28 NOTE — Telephone Encounter (Signed)
Try OTC pepcid complete bid x 7 days in place of protonix - if still not improved, call for OV or GI refer - thanks

## 2011-02-28 NOTE — Telephone Encounter (Signed)
Pt is requesting MD's advisement on an alternative med for heartburn-she is currently taking Protonix 40mg  with little relief

## 2011-03-13 ENCOUNTER — Encounter: Payer: Self-pay | Admitting: *Deleted

## 2011-03-13 ENCOUNTER — Ambulatory Visit (INDEPENDENT_AMBULATORY_CARE_PROVIDER_SITE_OTHER): Payer: BC Managed Care – PPO | Admitting: Internal Medicine

## 2011-03-13 ENCOUNTER — Encounter: Payer: Self-pay | Admitting: Internal Medicine

## 2011-03-13 VITALS — BP 126/84 | HR 77 | Temp 97.6°F | Ht 65.0 in | Wt 211.2 lb

## 2011-03-13 DIAGNOSIS — J029 Acute pharyngitis, unspecified: Secondary | ICD-10-CM

## 2011-03-13 DIAGNOSIS — T783XXA Angioneurotic edema, initial encounter: Secondary | ICD-10-CM

## 2011-03-13 DIAGNOSIS — H9209 Otalgia, unspecified ear: Secondary | ICD-10-CM

## 2011-03-13 DIAGNOSIS — G8929 Other chronic pain: Secondary | ICD-10-CM

## 2011-03-13 MED ORDER — PREDNISONE (PAK) 10 MG PO TABS: 10.0000 mg | ORAL_TABLET | ORAL | Status: AC

## 2011-03-13 MED ORDER — DIPHENHYD-HYDROCORT-NYSTATIN MT SUSP: 5.0000 mL | OROMUCOSAL | Status: DC

## 2011-03-13 NOTE — Patient Instructions (Signed)
It was good to see you today. pred pak and magic mouthwash for your throat - Your prescription(s) have been submitted to your pharmacy. Please take as directed and contact our office if you believe you are having problem(s) with the medication(s). Work note provided as requested

## 2011-03-13 NOTE — Progress Notes (Signed)
  Subjective:    Patient ID: [name] [name], female    DOB: 08-30-1961, 50 y.o.   MRN: [id number]  HPI complains of sore throat - "lump" on right side Precipitated by ingestion of watermelon  Onset yesterday afternoon Started with lips and tongue tingly, then swelling followed by "lump" in throat - Similar reaction with ACEI prev but never with food Took benadryl and swelling resolved but ild residual throat sensation today Lump not worse; no shortness of breath or wheeze  Also continued ache in right ear  Past Medical History  Diagnosis Date  . METABOLIC SYNDROME X   . OBESITY, TRUNCAL   . CLAUSTROPHOBIA   . CONSTIPATION, CHRONIC   . KELOID   . Multiple sclerosis   . Allergic rhinitis due to other allergen   . LATERAL EPICONDYLITIS, LEFT   . HYPERTENSION   . HYPERLIPIDEMIA   . GERD     Review of Systems  Constitutional: Negative for fever and fatigue.  HENT: Negative for hearing loss, congestion, neck stiffness and tinnitus.   Eyes: Negative for pain.  Respiratory: Negative for shortness of breath.   Cardiovascular: Negative for chest pain.       Objective:   Physical Exam BP 126/84  Pulse 77  Temp(Src) 97.6 F (36.4 C) (Oral)  Ht 5\' 5"  (1.651 m)  Wt 211 lb 3.2 oz (95.8 kg)  BMI 35.15 kg/m2  SpO2 97% Physical Exam  Constitutional: She is oriented to person, place, and time. She appears well-developed and well-nourished. No distress.  HENT: Head: Normocephalic and atraumatic. Ears; B TMs ok, no erythema or effusion; Nose: Nose normal.  Mouth/Throat: Oropharynx is clear and moist. No oropharyngeal exudate.  Eyes: Conjunctivae and EOM are normal. Pupils are equal, round, and reactive to light. No scleral icterus.  Neck: Normal range of motion. Neck supple. No JVD present. No thyromegaly present.  Cardiovascular: Normal rate, regular rhythm and normal heart sounds.  No murmur heard. No BLE edema. Pulmonary/Chest: Effort normal and breath sounds normal. No  respiratory distress. She has no wheezes.  Psychiatric: She has a normal mood and affect. Her behavior is normal. Judgment and thought content normal.       Lab Results  Component Value Date   WBC 6.5 11/01/2010   HGB 13.7 11/01/2010   HCT 39.3 11/01/2010   PLT 283.0 11/01/2010   CHOL 213* 11/01/2010   TRIG 195.0* 11/01/2010   HDL 47.80 11/01/2010   LDLDIRECT 133.0 11/01/2010   ALT 15 11/01/2010   AST 19 11/01/2010   NA 139 11/01/2010   K 3.9 11/01/2010   CL 102 11/01/2010   CREATININE 1.0 11/01/2010   BUN 13 11/01/2010   CO2 31 11/01/2010   TSH 1.97 11/01/2010   HGBA1C 5.9 12/30/2007     Assessment & Plan:  pharyngitis - no fever or dysphagia; no wheeze reports episode of lip/tongue swelling prior to taking benadryl last PM -  exam today benign but still feels "lump" right side throat-  tx pred pak and magic mouthwash - erx done  R ear pain - chronic, exam unremarkable Offered ENT eval, pt declines -  cont nasal steroid and antihist with decongest

## 2011-04-22 ENCOUNTER — Encounter: Payer: Self-pay | Admitting: Internal Medicine

## 2011-04-24 ENCOUNTER — Encounter: Payer: Self-pay | Admitting: Internal Medicine

## 2011-04-24 ENCOUNTER — Encounter: Payer: Self-pay | Admitting: *Deleted

## 2011-04-24 ENCOUNTER — Ambulatory Visit (INDEPENDENT_AMBULATORY_CARE_PROVIDER_SITE_OTHER): Payer: BC Managed Care – PPO | Admitting: Internal Medicine

## 2011-04-24 DIAGNOSIS — J209 Acute bronchitis, unspecified: Secondary | ICD-10-CM

## 2011-04-24 DIAGNOSIS — J069 Acute upper respiratory infection, unspecified: Secondary | ICD-10-CM

## 2011-04-24 MED ORDER — FLUCONAZOLE 150 MG PO TABS: 150.0000 mg | ORAL_TABLET | Freq: Once | ORAL | Status: AC

## 2011-04-24 MED ORDER — AMOXICILLIN 500 MG PO CAPS: 500.0000 mg | ORAL_CAPSULE | Freq: Three times a day (TID) | ORAL | Status: DC

## 2011-04-24 NOTE — Patient Instructions (Signed)
It was good to see you today. Amoxicillin for infection and diflucan for yeast if needed - Your prescription(s) have been submitted to your pharmacy. Please take as directed and contact our office if you believe you are having problem(s) with the medication(s). Work note provided as requested Hydrate, rest and use home allergy medications including nose spray and try pepcid as needed Please keep scheduled followup as planned, call sooner if problems.

## 2011-04-24 NOTE — Progress Notes (Signed)
  Subjective:     [name] [name] is a 50 y.o. female here for evaluation of a cough. Onset of symptoms was 3 days ago. Symptoms have been gradually worsening since that time. The cough is barky and productive of yellow and thin sputum and is aggravated by reclining position. Associated symptoms include: heartburn and postnasal drip. Patient does not have a history of asthma. Patient does have a history of environmental allergens. Patient has not traveled recently. Patient does not have a history of smoking. Patient has had a previous chest x-ray. Patient has had a PPD done.  The following portions of the patient's history were reviewed and updated as appropriate: allergies, current medications, past family history, past medical history, past social history, past surgical history and problem list.  Review of Systems Pertinent items are noted in HPI.    Objective:    Oxygen saturation 97% on room air BP 128/82  Pulse 84  Temp(Src) 99 F (37.2 C) (Oral)  Ht 5\' 5"  (1.651 m)  Wt 214 lb (97.07 kg)  BMI 35.61 kg/m2  SpO2 97% General appearance: alert, fatigued and mild distress Head: Normocephalic, without obvious abnormality, atraumatic, sinuses nontender to percussion Eyes: conjunctivae/corneas clear. PERRL, EOM's intact. Fundi benign. Ears: normal TM's and external ear canals both ears Throat: lips, mucosa, and tongue normal; teeth and gums normal and PND, yellow - no exudate Neck: mild anterior cervical adenopathy, no carotid bruit, no JVD, supple, symmetrical, trachea midline and thyroid not enlarged, symmetric, no tenderness/mass/nodules Lungs: clear to auscultation bilaterally Heart: regular rate and rhythm, S1, S2 normal, no murmur, click, rub or gallop    Assessment:    URI with Post Nasal Drip    Plan:    Antibiotics per medication orders. Avoid exposure to tobacco smoke and fumes. Call if shortness of breath worsens, blood in sputum, change in character of cough,  development of fever or chills, inability to maintain nutrition and hydration. Avoid exposure to tobacco smoke and fumes. Trial of antihistamine/decongestant combination. Trial of H-2 blocker.

## 2011-04-27 ENCOUNTER — Other Ambulatory Visit: Payer: Self-pay | Admitting: Internal Medicine

## 2011-05-11 ENCOUNTER — Other Ambulatory Visit: Payer: Self-pay | Admitting: Occupational Medicine

## 2011-05-11 ENCOUNTER — Ambulatory Visit: Payer: Self-pay

## 2011-05-11 DIAGNOSIS — R52 Pain, unspecified: Secondary | ICD-10-CM

## 2011-05-24 ENCOUNTER — Telehealth: Payer: Self-pay

## 2011-05-24 NOTE — Telephone Encounter (Signed)
Pt called to ask MD if it is safe to use Flax Seed with MS diagnosis and other prescribed medications?

## 2011-05-24 NOTE — Telephone Encounter (Signed)
Yes

## 2011-05-24 NOTE — Telephone Encounter (Signed)
Notified pt with md response...05/24/11@1 :38pm/LMB

## 2011-05-31 ENCOUNTER — Other Ambulatory Visit: Payer: Self-pay | Admitting: *Deleted

## 2011-05-31 MED ORDER — MOMETASONE FUROATE 50 MCG/ACT NA SUSP: 2.0000 | Freq: Every day | NASAL | Status: DC

## 2011-06-13 LAB — INFLUENZA A AND B ANTIGEN (CONVERTED LAB): Inflenza A Ag: NEGATIVE

## 2011-06-16 ENCOUNTER — Other Ambulatory Visit: Payer: Self-pay | Admitting: Obstetrics and Gynecology

## 2011-09-10 ENCOUNTER — Other Ambulatory Visit: Payer: Self-pay | Admitting: Internal Medicine

## 2011-09-20 ENCOUNTER — Other Ambulatory Visit: Payer: Self-pay | Admitting: Internal Medicine

## 2011-09-25 ENCOUNTER — Other Ambulatory Visit: Payer: Self-pay

## 2011-09-25 MED ORDER — LORATADINE-PSEUDOEPHEDRINE ER 5-120 MG PO TB12: 1.0000 | ORAL_TABLET | ORAL | Status: DC | PRN

## 2011-10-11 ENCOUNTER — Telehealth: Payer: Self-pay | Admitting: *Deleted

## 2011-10-11 ENCOUNTER — Other Ambulatory Visit (HOSPITAL_COMMUNITY): Payer: Self-pay | Admitting: Obstetrics & Gynecology

## 2011-10-11 DIAGNOSIS — Z1231 Encounter for screening mammogram for malignant neoplasm of breast: Secondary | ICD-10-CM

## 2011-10-11 DIAGNOSIS — Z0000 Encounter for general adult medical examination without abnormal findings: Secondary | ICD-10-CM

## 2011-10-11 NOTE — Telephone Encounter (Signed)
Received staff msg pt made cpx need labs in EPIC....10/11/11@2 :32pm/LMB

## 2011-10-23 ENCOUNTER — Encounter: Payer: Self-pay | Admitting: Internal Medicine

## 2011-10-23 ENCOUNTER — Ambulatory Visit (INDEPENDENT_AMBULATORY_CARE_PROVIDER_SITE_OTHER): Payer: BC Managed Care – PPO | Admitting: Internal Medicine

## 2011-10-23 VITALS — BP 122/74 | HR 82 | Temp 98.6°F

## 2011-10-23 DIAGNOSIS — J309 Allergic rhinitis, unspecified: Secondary | ICD-10-CM

## 2011-10-23 DIAGNOSIS — J0100 Acute maxillary sinusitis, unspecified: Secondary | ICD-10-CM

## 2011-10-23 DIAGNOSIS — B373 Candidiasis of vulva and vagina: Secondary | ICD-10-CM

## 2011-10-23 MED ORDER — AZITHROMYCIN 250 MG PO TABS: ORAL_TABLET | ORAL | Status: DC

## 2011-10-23 MED ORDER — PREDNISONE (PAK) 10 MG PO TABS: 10.0000 mg | ORAL_TABLET | ORAL | Status: AC

## 2011-10-23 MED ORDER — FLUCONAZOLE 150 MG PO TABS: 150.0000 mg | ORAL_TABLET | Freq: Once | ORAL | Status: DC

## 2011-10-23 NOTE — Patient Instructions (Signed)
It was good to see you today. Pred taper and Zpak antibiotics for your sinus and allergy symptoms - Your prescription(s) have been submitted to your pharmacy. Please take as directed and contact our office if you believe you are having problem(s) with the medication(s). Continued Claritin-D 12-Hour and nose sprays as discussed - both Astelin and Nasonex

## 2011-10-23 NOTE — Progress Notes (Signed)
  Subjective:    HPI  complains of head cold symptoms  Onset >1 week ago, wax/wane symptoms  Initially associated with rhinorrhea, sneezing, sore throat, mild headache and low grade fever Also myalgias, sinus pressure and mild-mod nasal congestion No relief with OTC meds Precipitated by sick contacts  Past Medical History  Diagnosis Date  . METABOLIC SYNDROME X   . OBESITY, TRUNCAL   . CLAUSTROPHOBIA   . CONSTIPATION, CHRONIC   . KELOID   . Multiple sclerosis   . Allergic rhinitis due to other allergen   . LATERAL EPICONDYLITIS, LEFT   . HYPERTENSION   . HYPERLIPIDEMIA   . GERD   . Angioedema     ACEI    Review of Systems Constitutional: No fever or night sweats, no unexpected weight change Pulmonary: No pleurisy or hemoptysis Cardiovascular: No chest pain or palpitations     Objective:   Physical Exam BP 122/74  Pulse 82  Temp(Src) 98.6 F (37 C) (Oral)  SpO2 95% GEN: mildly ill appearing and audible head/chest congestion HENT: NCAT, mild max sinus tenderness bilaterally, nares with yellow discharge, oropharynx mild erythema, no exudate Eyes: Vision grossly intact, no conjunctivitis Lungs: Clear to auscultation without rhonchi or wheeze, no increased work of breathing Cardiovascular: Regular rate and rhythm, no bilateral edema      Assessment & Plan:  Acute maxillary sinusitis allergic rhinitis  Vaginal candi following antibiotics    Empiric antibiotics prescribed due to symptom duration greater than 7 days Also pred taper for inflammation of allergic rhinitis symptoms  Continue Astelin and nasonex Symptomatic care with Tylenol or Advil, hydration and rest -  salt gargle advised as needed Diflucan erx done

## 2011-10-24 ENCOUNTER — Telehealth: Payer: Self-pay | Admitting: Internal Medicine

## 2011-10-24 MED ORDER — AZELASTINE HCL 0.1 % NA SOLN: 2.0000 | Freq: Two times a day (BID) | NASAL | Status: DC

## 2011-10-24 NOTE — Telephone Encounter (Signed)
Notified pt rx sent to pharmacy... 10/24/11@2 :27pm/LMB

## 2011-10-24 NOTE — Telephone Encounter (Signed)
The pt called and is hoping to get an rx of the Azelastine (Astelin) 137mg  nose spray.  She thought she had a sample of it at home, but does not.  Pharmacy- [facility] at the corner of [address].   Thanks!!

## 2011-10-24 NOTE — Telephone Encounter (Signed)
Ok - done - thanks

## 2011-11-06 ENCOUNTER — Encounter: Payer: Self-pay | Admitting: Internal Medicine

## 2011-11-06 ENCOUNTER — Ambulatory Visit (INDEPENDENT_AMBULATORY_CARE_PROVIDER_SITE_OTHER): Payer: BC Managed Care – PPO | Admitting: Internal Medicine

## 2011-11-06 ENCOUNTER — Other Ambulatory Visit (INDEPENDENT_AMBULATORY_CARE_PROVIDER_SITE_OTHER): Payer: BC Managed Care – PPO

## 2011-11-06 VITALS — BP 120/78 | HR 75 | Temp 98.3°F | Ht 65.0 in | Wt 213.0 lb

## 2011-11-06 DIAGNOSIS — Z0000 Encounter for general adult medical examination without abnormal findings: Secondary | ICD-10-CM

## 2011-11-06 DIAGNOSIS — G35 Multiple sclerosis: Secondary | ICD-10-CM

## 2011-11-06 DIAGNOSIS — I10 Essential (primary) hypertension: Secondary | ICD-10-CM

## 2011-11-06 LAB — URINALYSIS, ROUTINE W REFLEX MICROSCOPIC
Bilirubin Urine: NEGATIVE
Hgb urine dipstick: NEGATIVE
Ketones, ur: NEGATIVE
Leukocytes, UA: NEGATIVE
Nitrite: NEGATIVE
pH: 5.5 (ref 5.0–8.0)

## 2011-11-06 LAB — BASIC METABOLIC PANEL
BUN: 17 mg/dL (ref 6–23)
CO2: 30 mEq/L (ref 19–32)
Chloride: 101 mEq/L (ref 96–112)
Creatinine, Ser: 0.8 mg/dL (ref 0.4–1.2)

## 2011-11-06 LAB — LIPID PANEL
Cholesterol: 208 mg/dL — ABNORMAL HIGH (ref 0–200)
Triglycerides: 226 mg/dL — ABNORMAL HIGH (ref 0.0–149.0)

## 2011-11-06 LAB — CBC WITH DIFFERENTIAL/PLATELET
Basophils Relative: 0.2 % (ref 0.0–3.0)
Eosinophils Absolute: 0.2 10*3/uL (ref 0.0–0.7)
HCT: 39.5 % (ref 36.0–46.0)
Lymphs Abs: 2.5 10*3/uL (ref 0.7–4.0)
MCHC: 33.9 g/dL (ref 30.0–36.0)
MCV: 88.5 fl (ref 78.0–100.0)
Monocytes Absolute: 0.9 10*3/uL (ref 0.1–1.0)
Neutrophils Relative %: 55.4 % (ref 43.0–77.0)
RBC: 4.46 Mil/uL (ref 3.87–5.11)

## 2011-11-06 LAB — HEPATIC FUNCTION PANEL
Albumin: 4.1 g/dL (ref 3.5–5.2)
Bilirubin, Direct: 0.1 mg/dL (ref 0.0–0.3)
Total Protein: 7.4 g/dL (ref 6.0–8.3)

## 2011-11-06 NOTE — Progress Notes (Signed)
Subjective:    Patient ID: [name] [name], female    DOB: Aug 22, 1961, 51 y.o.   MRN: [id number]  HPI  patient is here today for annual physical. Patient feels well and has no complaints.  also reviewed chronic med issues: allergy and sinus symptoms  -reports compliance with ongoing medical treatment and no changes in medication dose or frequency.denies adverse side effects related to current therapy.   MS - stable on current tx - no flares of MS - no med changes  HTN - reports compliance with ongoing medical treatment and no changes in medication dose or frequency. denies adverse side effects related to current therapy.   Past Medical History  Diagnosis Date  . METABOLIC SYNDROME X   . OBESITY, TRUNCAL   . CONSTIPATION, CHRONIC   . KELOID   . Multiple sclerosis   . Allergic rhinitis due to other allergen   . HYPERTENSION   . HYPERLIPIDEMIA   . GERD   . Angioedema     ACEI   Family History  Problem Relation Age of Onset  . Coronary artery disease Mother   . Heart disease Mother   . Hypertension Other   . Hyperlipidemia Other   . Diabetes Other    History  Substance Use Topics  . Smoking status: Never Smoker   . Smokeless tobacco: Not on file   Comment: Single- brother staying with pt since mom's death  . Alcohol Use: No     Review of Systems  Constitutional: Positive for fatigue. Negative for fever.  Respiratory: Negative for cough and shortness of breath.   Cardiovascular: Negative for chest pain.  Neurological: Negative for headaches.  Gastrointestinal: Negative for abdominal pain, no bowel changes.  Musculoskeletal: Negative for gait problem or joint swelling.  Skin: Negative for rash.  No other specific complaints in a complete review of systems (except as listed in HPI above).      Objective:   Physical Exam  BP 120/78  Pulse 75  Temp(Src) 98.3 F (36.8 C) (Oral)  Ht 5\' 5"  (1.651 m)  Wt 213 lb (96.616 kg)  BMI 35.44 kg/m2  SpO2 97%  Wt  Readings from Last 3 Encounters:  11/06/11 213 lb (96.616 kg)  04/24/11 214 lb (97.07 kg)  03/13/11 211 lb 3.2 oz (95.8 kg)   Constitutional: She appears well-developed and well-nourished. No distress.  HENT: Head: Normocephalic and atraumatic. Ears: B TMs ok, no erythema or effusion; Nose: Nose normal.  Mouth/Throat: Oropharynx is clear and moist. No oropharyngeal exudate.  Eyes: Conjunctivae and EOM are normal. Pupils are equal, round, and reactive to light. No scleral icterus.  Neck: Normal range of motion. Neck supple. No JVD present. No thyromegaly present.  Cardiovascular: Normal rate, regular rhythm and normal heart sounds.  No murmur heard. No BLE edema. Pulmonary/Chest: Effort normal and breath sounds normal. No respiratory distress. She has no wheezes.  Abdominal: Soft. Bowel sounds are normal. She exhibits no distension. There is no tenderness. no masses Musculoskeletal: Normal range of motion, no joint effusions. No gross deformities Neurological: She is alert and oriented to person, place, and time. No cranial nerve deficit. Coordination normal.  Skin: Skin is warm and dry. No rash noted. No erythema.  Psychiatric: She has a normal mood and affect. Her behavior is normal. Judgment and thought content normal.   EKG: NSR @ 62 bpm - no ST-T changes or arryhtmias      Assessment & Plan:   CPX - v70.0 - Patient has been counseled  on age-appropriate routine health concerns for screening and prevention. These are reviewed and up-to-date. Immunizations are up-to-date or declined. Labs ordered and ECG reviewed.  Also See problem list. Medications and labs reviewed today.

## 2011-11-06 NOTE — Patient Instructions (Signed)
It was good to see you today. Test(s) ordered today. Your results will be called to you after review (48-72hours after test completion). If any changes need to be made, you will be notified at that time. Exam and EKG looks good - keep working with [email or web address] to increase her physical activity and reduced caloric intake for ultimate goal of weight loss -  Followup with Dr. Marina Goodell as planned - we will send copy of your lab results to Dr. Dolheimier as requested Medications reviewed, no changes at this time. Please schedule followup in 12 months for physical and labs, call sooner if problems.

## 2011-11-06 NOTE — Assessment & Plan Note (Signed)
BP Readings from Last 3 Encounters:  11/06/11 120/78  10/23/11 122/74  04/24/11 128/82   The current medical regimen is effective;  continue present plan and medications.

## 2011-11-06 NOTE — Assessment & Plan Note (Signed)
Stable without flares - not currently on medical treatment Follows every 6-12 months with neurology The current medical regimen is effective;  continue present plan and medications.

## 2011-11-08 ENCOUNTER — Telehealth: Payer: Self-pay

## 2011-11-08 NOTE — Telephone Encounter (Signed)
Pt called requesting MD advisement on if it would be safe for her to take Chromiumpicolinate? Please advise

## 2011-11-08 NOTE — Telephone Encounter (Signed)
Notified pt with md response... 11/08/11@5 :16pm/LMB

## 2011-11-08 NOTE — Telephone Encounter (Signed)
Since this medication is not FDA approved, I cannot recommend the medication. However I do believe it is safe with her other listed medications and okay to try if patient desires to do so

## 2011-11-13 ENCOUNTER — Ambulatory Visit (HOSPITAL_COMMUNITY)
Admission: RE | Admit: 2011-11-13 | Discharge: 2011-11-13 | Disposition: A | Discharge status: Home / Self Care | Payer: BC Managed Care – PPO | Source: Ambulatory Visit | Attending: Obstetrics & Gynecology | Admitting: Obstetrics & Gynecology

## 2011-11-13 DIAGNOSIS — Z1231 Encounter for screening mammogram for malignant neoplasm of breast: Secondary | ICD-10-CM | POA: Insufficient documentation

## 2011-11-15 ENCOUNTER — Other Ambulatory Visit: Payer: Self-pay | Admitting: Obstetrics & Gynecology

## 2011-11-15 DIAGNOSIS — R928 Other abnormal and inconclusive findings on diagnostic imaging of breast: Secondary | ICD-10-CM

## 2011-11-17 ENCOUNTER — Telehealth: Payer: Self-pay

## 2011-11-17 NOTE — Telephone Encounter (Signed)
Pt called to requesting MD's advisement on a Nutritionist for weight loss, please advise.

## 2011-11-20 NOTE — Telephone Encounter (Signed)
Dr Debby Bud uses [facility] (outside of [facility]) - she is "tough" but gets results - will refer for eval per pt request

## 2011-11-20 NOTE — Telephone Encounter (Signed)
Notified pt with md response. Pt agreed and want to go let her know will be contacted by [facility] once has pm/LMNbeen set-up, also want copy of last blood work sent to Dr. Porfirio Mylar Dohmeier.... 11/20/11@2 :57pm/LMB

## 2011-11-23 ENCOUNTER — Ambulatory Visit
Admission: RE | Admit: 2011-11-23 | Discharge: 2011-11-23 | Disposition: A | Discharge status: Home / Self Care | Payer: BC Managed Care – PPO | Source: Ambulatory Visit | Attending: Obstetrics & Gynecology | Admitting: Obstetrics & Gynecology

## 2011-11-23 DIAGNOSIS — R928 Other abnormal and inconclusive findings on diagnostic imaging of breast: Secondary | ICD-10-CM

## 2011-11-25 ENCOUNTER — Other Ambulatory Visit: Payer: Self-pay | Admitting: Internal Medicine

## 2011-11-27 ENCOUNTER — Telehealth: Payer: Self-pay | Admitting: *Deleted

## 2011-11-27 NOTE — Telephone Encounter (Signed)
Pt is calling on status of referral to nutritionist. Pt states it has been about 2 weeks and she has not heard back regarding appt.

## 2011-12-06 ENCOUNTER — Encounter: Payer: Self-pay | Admitting: Internal Medicine

## 2011-12-06 ENCOUNTER — Ambulatory Visit (INDEPENDENT_AMBULATORY_CARE_PROVIDER_SITE_OTHER): Payer: BC Managed Care – PPO | Admitting: Internal Medicine

## 2011-12-06 ENCOUNTER — Encounter: Payer: Self-pay | Admitting: *Deleted

## 2011-12-06 VITALS — BP 118/82 | HR 90 | Temp 98.0°F

## 2011-12-06 DIAGNOSIS — M542 Cervicalgia: Secondary | ICD-10-CM

## 2011-12-06 MED ORDER — CYCLOBENZAPRINE HCL 5 MG PO TABS: 5.0000 mg | ORAL_TABLET | Freq: Three times a day (TID) | ORAL | Status: DC | PRN

## 2011-12-06 MED ORDER — IBUPROFEN 800 MG PO TABS: 800.0000 mg | ORAL_TABLET | Freq: Three times a day (TID) | ORAL | Status: AC | PRN

## 2011-12-06 NOTE — Patient Instructions (Signed)
It was good to see you today. Ibuprofen 800 tab 3x/day for next 24h, then as needed for pain Flexeril 5mg  tab 3x/day for next 24h, then at bedtime as needed for muscle spasm pain Your prescription(s) have been submitted to your pharmacy. Please take as directed and contact our office if you believe you are having problem(s) with the medication(s). Work excuse note for today and tomorrow as discussed

## 2011-12-06 NOTE — Progress Notes (Signed)
  Subjective:    Patient ID: [name] [name], female    DOB: 1961-02-03, 51 y.o.   MRN: [id number]  HPI complains of right side neck pain Onset 24h ago, sudden Denies overexertion or injury No radiation of pain into shoulder or BUE No headache, no fever Denies prior hx same  Past Medical History  Diagnosis Date  . METABOLIC SYNDROME X   . OBESITY, TRUNCAL   . CONSTIPATION, CHRONIC   . KELOID   . Multiple sclerosis   . Allergic rhinitis due to other allergen   . HYPERTENSION   . HYPERLIPIDEMIA   . GERD   . Angioedema     ACEI    Review of Systems  Constitutional: Negative for fever, fatigue and unexpected weight change.  HENT: Positive for neck pain. Negative for facial swelling.   Respiratory: Negative for cough and shortness of breath.   Cardiovascular: Negative for chest pain and leg swelling.       Objective:   Physical Exam  BP 118/82  Pulse 90  Temp(Src) 98 F (36.7 C) (Oral)  SpO2 96%  LMP 11/07/2011 Wt Readings from Last 3 Encounters:  11/06/11 213 lb (96.616 kg)  04/24/11 214 lb (97.07 kg)  03/13/11 211 lb 3.2 oz (95.8 kg)   Constitutional: She appears well-developed and well-nourished. No distress.  Eyes: Conjunctivae and EOM are normal. Pupils are equal, round, and reactive to light. No scleral icterus.  Neck: Normal range of motion. Spasm over R paraspinal but neck supple. No JVD present. No thyromegaly present.  Cardiovascular: Normal rate, regular rhythm and normal heart sounds.  No murmur heard. No BLE edema. Pulmonary/Chest: Effort normal and breath sounds normal. No respiratory distress. She has no wheezes.  Musculoskeletal: Normal range of motion, no joint effusions. No gross deformities Neurological: She is alert and oriented to person, place, and time. No cranial nerve deficit. Coordination normal.  Skin: Skin is warm and dry. No rash noted. No erythema.  Lab Results  Component Value Date   WBC 8.0 11/06/2011   HGB 13.4 11/06/2011   HCT  39.5 11/06/2011   PLT 273.0 11/06/2011   GLUCOSE 105* 11/06/2011   CHOL 208* 11/06/2011   TRIG 226.0* 11/06/2011   HDL 50.60 11/06/2011   LDLDIRECT 122.7 11/06/2011   LDLCALC 126* 06/22/2008   ALT 20 11/06/2011   AST 18 11/06/2011   NA 139 11/06/2011   K 3.9 11/06/2011   CL 101 11/06/2011   CREATININE 0.8 11/06/2011   BUN 17 11/06/2011   CO2 30 11/06/2011   TSH 1.75 11/06/2011   HGBA1C 5.9 12/30/2007      Assessment & Plan:   Neck pain, R side spasm -  flexeril and ibuprofen - erx done -  Provided education and reassurance on same Also generate work excuse note - today and tomorrow

## 2011-12-21 ENCOUNTER — Telehealth: Payer: Self-pay

## 2011-12-21 NOTE — Telephone Encounter (Signed)
1) muscle relaxer for her neck - take at bedtime as needed (prev rx flexeril - can refill if needed) 2) yes, BM change possibly due to her laxative - but if persisting color change, or if symptoms persist overnight, she should schedule OV for check - or go to ER if worse between now and then

## 2011-12-21 NOTE — Telephone Encounter (Signed)
Pt called stating she had 2 BM that were red in color but after drinking red magnesium of citrate. Pt is concerned, is this likely to have been caused by magnesium?

## 2011-12-21 NOTE — Telephone Encounter (Signed)
Pt called stating that she still has intermittent "crook" in her neck. Pt is requesting additional advisement from MD.

## 2011-12-22 NOTE — Telephone Encounter (Signed)
Pt informed and states that BM resolved.

## 2011-12-25 ENCOUNTER — Telehealth: Payer: Self-pay | Admitting: Cardiology

## 2011-12-25 NOTE — Telephone Encounter (Signed)
New Problem: (Patient last seen on 05/28/09)    Patient felt like her heart has been fluttering since last Thursday and she was wondering if she should be concerned.  Please call back.

## 2011-12-25 NOTE — Telephone Encounter (Signed)
Spoke with patient and she denies any other symptoms except "fluttering".  Heart rate when she counted in 70's.  Advised to contact her PCP to be seen sometime soon.  If they think she needs for [place] to evaluate to call back.

## 2011-12-26 ENCOUNTER — Encounter: Payer: Self-pay | Admitting: Internal Medicine

## 2011-12-26 ENCOUNTER — Ambulatory Visit (INDEPENDENT_AMBULATORY_CARE_PROVIDER_SITE_OTHER): Payer: BC Managed Care – PPO | Admitting: Internal Medicine

## 2011-12-26 VITALS — BP 130/82 | HR 81 | Temp 98.3°F | Wt 212.8 lb

## 2011-12-26 DIAGNOSIS — R002 Palpitations: Secondary | ICD-10-CM

## 2011-12-26 NOTE — Progress Notes (Signed)
  Subjective:    Patient ID: [name] [name], female    DOB: 1960/12/01, 51 y.o.   MRN: [id number]  Palpitations  This is a new problem. The current episode started in the past 7 days. The problem occurs 2 to 4 times per day. The problem has been gradually improving. On average, each episode lasts 15 seconds. Pertinent negatives include no anxiety, chest pain, coughing, dizziness, fever, irregular heartbeat (fluttering, not skipping), nausea, near-syncope, numbness, shortness of breath or vomiting. She has tried nothing for the symptoms. Risk factors include sedentary lifestyle, obesity and dyslipidemia.   Past Medical History  Diagnosis Date  . METABOLIC SYNDROME X   . OBESITY, TRUNCAL   . CONSTIPATION, CHRONIC   . KELOID   . Multiple sclerosis   . Allergic rhinitis due to other allergen   . HYPERTENSION   . HYPERLIPIDEMIA   . GERD   . Angioedema     ACEI    Review of Systems  Constitutional: Negative for fever.  Respiratory: Negative for cough and shortness of breath.   Cardiovascular: Positive for palpitations. Negative for chest pain and near-syncope.  Gastrointestinal: Negative for nausea and vomiting.  Neurological: Negative for dizziness and numbness.       Objective:   Physical Exam BP 130/82  Pulse 81  Temp(Src) 98.3 F (36.8 C) (Oral)  Wt 212 lb 12.8 oz (96.525 kg)  SpO2 98% Wt Readings from Last 3 Encounters:  12/26/11 212 lb 12.8 oz (96.525 kg)  11/06/11 213 lb (96.616 kg)  04/24/11 214 lb (97.07 kg)   Constitutional: She appears well-developed and well-nourished. No distress.  Neck: Normal range of motion. Neck supple. No JVD present. No thyromegaly present.  Cardiovascular: Normal rate, regular rhythm and normal heart sounds.  No murmur heard. No BLE edema. Pulmonary/Chest: Effort normal and breath sounds normal. No respiratory distress. She has no wheezes.  Psychiatric: She has a normal mood and affect. Her behavior is normal. Judgment and thought  content normal.   Lab Results  Component Value Date   WBC 8.0 11/06/2011   HGB 13.4 11/06/2011   HCT 39.5 11/06/2011   PLT 273.0 11/06/2011   GLUCOSE 105* 11/06/2011   CHOL 208* 11/06/2011   TRIG 226.0* 11/06/2011   HDL 50.60 11/06/2011   LDLDIRECT 122.7 11/06/2011   LDLCALC 126* 06/22/2008   ALT 20 11/06/2011   AST 18 11/06/2011   NA 139 11/06/2011   K 3.9 11/06/2011   CL 101 11/06/2011   CREATININE 0.8 11/06/2011   BUN 17 11/06/2011   CO2 30 11/06/2011   TSH 1.75 11/06/2011   HGBA1C 5.9 12/30/2007   November 2007 Myoview: negative for ischemia December 2007 2-D echo: Normal LVEF at 60%, no valvular disease     Assessment & Plan:  Palpitations. Ongoing daily for last 5 days. Reports symptoms unimproved. No precipitating factors on exam. Recent labs and EKG in February unremarkable for anemia, thyroid problems. Hemodynamically stable and no hypoxia. Will arrange for 24-hour Holter monitor to catch event. Continue ongoing current beta blocker. Further evaluation to depend on symptoms and results of Holter monitor. Instructed patient to avoid stimulants such as caffeine, chocolate and to monitor stress in her life as this may be triggering factor

## 2011-12-26 NOTE — Patient Instructions (Signed)
It was good to see you today. we'll make referral for 24h heart monitor to look at these fluttering spells. Our office will contact you regarding appointment(s) once made. Your results will be called to you after review (48-72hours after test completion). If any changes need to be made, you will be notified at that time. Medications reviewed, no changes at this time. We have reviewed your prior records including labs and tests today - normal stress test 07/2006 and normal heart ultrasound 08/2006   Palpitations   A palpitation is the feeling that your heartbeat is irregular or is faster than normal. Although this is frightening, it usually is not serious. Palpitations may be caused by excesses of smoking, caffeine, or alcohol. They are also brought on by stress and anxiety. Sometimes, they are caused by heart disease. Unless otherwise noted, your caregiver did not find any signs of serious illness at this time. HOME CARE INSTRUCTIONS   To help prevent palpitations:  Drink decaffeinated coffee, tea, and soda pop. Avoid chocolate.   If you smoke or drink alcohol, quit or cut down as much as possible.   Reduce your stress or anxiety level. Biofeedback, yoga, or meditation will help you relax. Physical activity such as swimming, jogging, or walking also may be helpful.  SEEK MEDICAL CARE IF:    You continue to have a fast heartbeat.   Your palpitations occur more often.  SEEK IMMEDIATE MEDICAL CARE IF: You develop chest pain, shortness of breath, severe headache, dizziness, or fainting. Document Released: 09/01/2000 Document Revised: 08/24/2011 Document Reviewed: 11/01/2007 [facility] Patient Information 2012 [place], [place].

## 2012-01-11 ENCOUNTER — Encounter: Payer: Self-pay | Admitting: Internal Medicine

## 2012-01-11 ENCOUNTER — Ambulatory Visit (INDEPENDENT_AMBULATORY_CARE_PROVIDER_SITE_OTHER): Payer: BC Managed Care – PPO | Admitting: Internal Medicine

## 2012-01-11 VITALS — BP 122/80 | HR 88 | Temp 98.7°F

## 2012-01-11 DIAGNOSIS — J309 Allergic rhinitis, unspecified: Secondary | ICD-10-CM

## 2012-01-11 DIAGNOSIS — J0100 Acute maxillary sinusitis, unspecified: Secondary | ICD-10-CM

## 2012-01-11 MED ORDER — AZITHROMYCIN 250 MG PO TABS: ORAL_TABLET | ORAL | Status: AC

## 2012-01-11 NOTE — Patient Instructions (Signed)
It was good to see you today. Zpak antibiotics for your sinus and allergy symptoms - Your prescription(s) have been submitted to your pharmacy. Please take as directed and contact our office if you believe you are having problem(s) with the medication(s). Continued Claritin-D 12-Hour and nose sprays as discussed - both Astelin and Nasonex

## 2012-01-11 NOTE — Progress Notes (Signed)
  Subjective:    Patient ID: [name] [name], female    DOB: 11-02-1960, 51 y.o.   MRN: [id number]  HPI complains of sinus pressure, ?infection associated with mild frontal headache, nasal discharge (thick yellow green) and PND with cough Compliant with allergy and sinus meds as rx'd  Past Medical History  Diagnosis Date  . METABOLIC SYNDROME X   . OBESITY, TRUNCAL   . CONSTIPATION, CHRONIC   . KELOID   . Multiple sclerosis   . Allergic rhinitis due to other allergen   . HYPERTENSION   . HYPERLIPIDEMIA   . GERD   . Angioedema     ACEI    Review of Systems  Constitutional: Negative for fever.  HENT: Positive for rhinorrhea, sneezing and postnasal drip. Ear pain: L side, mild.   Eyes: Negative for pain and visual disturbance.       Objective:   Physical Exam BP 122/80  Pulse 88  Temp(Src) 98.7 F (37.1 C) (Oral)  SpO2 97% Gen: mildly ill, nontoxic HENT: sinus tenderness to palp maxillary, OP red with PND, no exudate Neck: no LAD Lungs: CTA CV: RRR       Assessment & Plan:  Acute maxillary sinusitis - Zpak allergic rhinitis - continue ongoing treatment

## 2012-01-12 ENCOUNTER — Ambulatory Visit: Payer: Self-pay | Admitting: Internal Medicine

## 2012-02-27 ENCOUNTER — Telehealth: Payer: Self-pay | Admitting: Internal Medicine

## 2012-02-27 NOTE — Telephone Encounter (Signed)
Caller: Alazia/Patient; PCP: Rene Paci; CB#: [phone]; Call regarding Edema; LMP 02/17/12 Hands swelling, voiding 3 x q d. PO intake QS per pt. Appt sched for 02/28/12 @ 1100 with Dr. Felicity Coyer.

## 2012-02-27 NOTE — Telephone Encounter (Signed)
noted 

## 2012-02-28 ENCOUNTER — Ambulatory Visit (INDEPENDENT_AMBULATORY_CARE_PROVIDER_SITE_OTHER): Payer: BC Managed Care – PPO | Admitting: Internal Medicine

## 2012-02-28 ENCOUNTER — Encounter: Payer: Self-pay | Admitting: Internal Medicine

## 2012-02-28 VITALS — BP 132/90 | HR 84 | Temp 98.2°F | Ht 65.0 in | Wt 220.2 lb

## 2012-02-28 DIAGNOSIS — E8881 Metabolic syndrome: Secondary | ICD-10-CM

## 2012-02-28 DIAGNOSIS — E785 Hyperlipidemia, unspecified: Secondary | ICD-10-CM

## 2012-02-28 DIAGNOSIS — E65 Localized adiposity: Secondary | ICD-10-CM

## 2012-02-28 DIAGNOSIS — R609 Edema, unspecified: Secondary | ICD-10-CM

## 2012-02-28 MED ORDER — FUROSEMIDE 20 MG PO TABS: 20.0000 mg | ORAL_TABLET | Freq: Every day | ORAL | Status: DC

## 2012-02-28 MED ORDER — PHENTERMINE HCL 37.5 MG PO TABS: 37.5000 mg | ORAL_TABLET | Freq: Every day | ORAL | Status: DC

## 2012-02-28 NOTE — Assessment & Plan Note (Signed)
Never on rx meds for same - Taking flax seed and working on diet - see next

## 2012-02-28 NOTE — Assessment & Plan Note (Signed)
Refer to nutritonist and try appetite suppressant prn - new rx done Wt Readings from Last 3 Encounters:  02/28/12 220 lb 3.2 oz (99.882 kg)  12/26/11 212 lb 12.8 oz (96.525 kg)  11/06/11 213 lb (96.616 kg)  The patient is asked to make an attempt to improve diet and exercise patterns to aid in medical management of this problem.

## 2012-02-28 NOTE — Progress Notes (Signed)
  Subjective:    Patient ID: [name] [name], female    DOB: 1961-07-15, 51 y.o.   MRN: [id number]  HPI  patient is here for weight gain and swelling - Unable to afford nutritionist (private) as referred 11/17/11 -not interested in weight loss surgery  also reviewed chronic med issues: allergy and sinus symptoms  -reports compliance with ongoing medical treatment and no changes in medication dose or frequency. denies adverse side effects related to current therapy.   MS - stable on current tx - no flares of MS - no med changes  HTN - reports compliance with ongoing medical treatment and no changes in medication dose or frequency. denies adverse side effects related to current therapy.   Past Medical History  Diagnosis Date  . METABOLIC SYNDROME X   . OBESITY, TRUNCAL   . CONSTIPATION, CHRONIC   . KELOID   . Multiple sclerosis   . Allergic rhinitis due to other allergen   . HYPERTENSION   . HYPERLIPIDEMIA   . GERD   . Angioedema     ACEI    Review of Systems  Constitutional: Positive for fatigue. Negative for fever.  Respiratory: Negative for cough and shortness of breath.   Cardiovascular: Positive for leg swelling. Negative for chest pain.  Neurological: Negative for headaches.      Objective:   Physical Exam  BP 132/90  Pulse 84  Temp 98.2 F (36.8 C) (Oral)  Ht 5\' 5"  (1.651 m)  Wt 220 lb 3.2 oz (99.882 kg)  BMI 36.64 kg/m2  SpO2 97%  Wt Readings from Last 3 Encounters:  02/28/12 220 lb 3.2 oz (99.882 kg)  12/26/11 212 lb 12.8 oz (96.525 kg)  11/06/11 213 lb (96.616 kg)   Constitutional: She is overweight, but appears well-developed and well-nourished. No distress.  Neck: Normal range of motion. Neck supple. No JVD present. No thyromegaly present.  Cardiovascular: Normal rate, regular rhythm and normal heart sounds.  No murmur heard. trace dependant BLE edema. Pulmonary/Chest: Effort normal and breath sounds normal. No respiratory distress. She has no wheezes.   Neurological: She is alert and oriented to person, place, and time. No cranial nerve deficit. Coordination normal.  Skin: Skin is warm and dry. No rash noted. No erythema.   Lab Results  Component Value Date   WBC 8.0 11/06/2011   HGB 13.4 11/06/2011   HCT 39.5 11/06/2011   PLT 273.0 11/06/2011   GLUCOSE 105* 11/06/2011   CHOL 208* 11/06/2011   TRIG 226.0* 11/06/2011   HDL 50.60 11/06/2011   LDLDIRECT 122.7 11/06/2011   LDLCALC 126* 06/22/2008   ALT 20 11/06/2011   AST 18 11/06/2011   NA 139 11/06/2011   K 3.9 11/06/2011   CL 101 11/06/2011   CREATININE 0.8 11/06/2011   BUN 17 11/06/2011   CO2 30 11/06/2011   TSH 1.75 11/06/2011   HGBA1C 5.9 12/30/2007        Assessment & Plan:   See problem list. Medications and labs reviewed today.  Dependant edema, exac by weight and summer heat - change hctz to lasix qd

## 2012-02-28 NOTE — Assessment & Plan Note (Signed)
Continued weight gain - will refer to hospital nutritionist to help support Also phentermine - new rx done

## 2012-02-28 NOTE — Patient Instructions (Signed)
It was good to see you today. Change fluid pills from HCTZ to lasix - Your prescription(s) have been submitted to your pharmacy. Please take as directed and contact our office if you believe you are having problem(s) with the medication(s). Phentermine to help suppress appetite - once daily as needed - paper prescription given to you we'll make referral to nutritionist. Our office will contact you regarding appointment(s) once made. Please schedule followup in 3-4 months, call sooner if problems.

## 2012-03-26 ENCOUNTER — Telehealth: Payer: Self-pay | Admitting: Internal Medicine

## 2012-03-26 NOTE — Telephone Encounter (Signed)
Caller: [name] [name]/Patient; PCP: Rene Paci; CB#: [phone]; ; Call regarding elevated BP and Bilateral Ear Congestion; BP 153/79 1900 03/25/12; BP 03/26/12 149/82 and 153/90 at 0900.  LMP/ 03/12/12.BTL. Advised to see MD within 72 hrs for multiple elevated BP readings without other symptoms and readings exceed expected range per Hypertension Diagnosed Guideline.  Appt scheduled for 03/27/12 at 0900 with Dr Yetta Barre.

## 2012-03-27 ENCOUNTER — Ambulatory Visit: Payer: Self-pay | Admitting: Internal Medicine

## 2012-03-27 ENCOUNTER — Encounter: Payer: Self-pay | Admitting: Internal Medicine

## 2012-03-27 ENCOUNTER — Ambulatory Visit (INDEPENDENT_AMBULATORY_CARE_PROVIDER_SITE_OTHER): Payer: BC Managed Care – PPO | Admitting: Internal Medicine

## 2012-03-27 VITALS — BP 140/82 | HR 91 | Temp 98.6°F | Ht 65.0 in | Wt 216.0 lb

## 2012-03-27 DIAGNOSIS — I10 Essential (primary) hypertension: Secondary | ICD-10-CM

## 2012-03-27 DIAGNOSIS — J309 Allergic rhinitis, unspecified: Secondary | ICD-10-CM

## 2012-03-27 MED ORDER — CETIRIZINE-PSEUDOEPHEDRINE ER 5-120 MG PO TB12: 1.0000 | ORAL_TABLET | Freq: Two times a day (BID) | ORAL | Status: DC

## 2012-03-27 MED ORDER — LOSARTAN POTASSIUM 50 MG PO TABS: 50.0000 mg | ORAL_TABLET | Freq: Every day | ORAL | Status: DC

## 2012-03-27 NOTE — Patient Instructions (Signed)
It was good to see you today. Change claritin D to Zyrtec D 12h for allergy and sinus Add losartan 50mg  daily to ongoing coreg if blood pressure still >130/85 -  Your prescription(s) have been submitted to your pharmacy. Please take as directed and contact our office if you believe you are having problem(s) with the medication(s). Please schedule followup in 6-8 weeks to recheck blood pressure, call sooner if problems.

## 2012-03-27 NOTE — Assessment & Plan Note (Signed)
BP Readings from Last 3 Encounters:  03/27/12 140/82  02/28/12 132/90  01/11/12 122/80   Add losartan to ongoing coreg -  Changed hctz to lasix for edema 02/2012 (which has improved)

## 2012-03-27 NOTE — Assessment & Plan Note (Signed)
Decongestant may be contributing to elevated blood pressure but has taken same for years with good blood pressure control Try change in formula given "ear congestion" with benign exam - pt will try Zyrtec D 12h

## 2012-03-27 NOTE — Progress Notes (Signed)
  Subjective:    Patient ID: [name] [name], female    DOB: Jul 08, 1961, 51 y.o.   MRN: [id number]  Hypertension Pertinent negatives include no chest pain, headaches or shortness of breath.   patient is here for B ear congestion  also reviewed chronic med issues: allergy and sinus symptoms  -reports compliance with ongoing medical treatment and no changes in medication dose or frequency. denies adverse side effects related to current therapy.   MS - stable on current tx - no flares of MS - no med changes  HTN - see above - reports compliance with ongoing medical treatment and no changes in medication dose or frequency. denies adverse side effects related to current therapy.   Past Medical History  Diagnosis Date  . METABOLIC SYNDROME X   . OBESITY, TRUNCAL   . CONSTIPATION, CHRONIC   . KELOID   . Multiple sclerosis   . Allergic rhinitis due to other allergen   . HYPERTENSION   . HYPERLIPIDEMIA   . GERD   . Angioedema     ACEI    Review of Systems  Constitutional: Positive for fatigue. Negative for fever.  HENT: Positive for congestion. Negative for ear pain and ear discharge.   Respiratory: Negative for cough and shortness of breath.   Cardiovascular: Negative for chest pain and leg swelling.  Neurological: Negative for headaches.      Objective:   Physical Exam  BP 140/82  Pulse 91  Temp 98.6 F (37 C) (Oral)  Ht 5\' 5"  (1.651 m)  Wt 216 lb (97.977 kg)  BMI 35.94 kg/m2  SpO2 97%  Wt Readings from Last 3 Encounters:  03/27/12 216 lb (97.977 kg)  02/28/12 220 lb 3.2 oz (99.882 kg)  12/26/11 212 lb 12.8 oz (96.525 kg)   Constitutional: She is overweight, but appears well-developed and well-nourished. No distress.  HENT: sinus non tender to palpation -TMs clear - no effusion or cerumen, OP with cobblestoning and mild erythema, no exudate Neck: Normal range of motion. Neck supple. No JVD present. No thyromegaly present.  Cardiovascular: Normal rate, regular rhythm  and normal heart sounds.  No murmur heard. no BLE edema. Pulmonary/Chest: Effort normal and breath sounds normal. No respiratory distress. She has no wheezes.  Neurological: She is alert and oriented to person, place, and time. No cranial nerve deficit. Coordination normal.  Skin: Skin is warm and dry. No rash noted. No erythema.   Lab Results  Component Value Date   WBC 8.0 11/06/2011   HGB 13.4 11/06/2011   HCT 39.5 11/06/2011   PLT 273.0 11/06/2011   GLUCOSE 105* 11/06/2011   CHOL 208* 11/06/2011   TRIG 226.0* 11/06/2011   HDL 50.60 11/06/2011   LDLDIRECT 122.7 11/06/2011   LDLCALC 126* 06/22/2008   ALT 20 11/06/2011   AST 18 11/06/2011   NA 139 11/06/2011   K 3.9 11/06/2011   CL 101 11/06/2011   CREATININE 0.8 11/06/2011   BUN 17 11/06/2011   CO2 30 11/06/2011   TSH 1.75 11/06/2011   HGBA1C 5.9 12/30/2007        Assessment & Plan:   See problem list. Medications and labs reviewed today.

## 2012-03-28 ENCOUNTER — Ambulatory Visit: Payer: Self-pay | Admitting: *Deleted

## 2012-04-03 ENCOUNTER — Other Ambulatory Visit: Payer: Self-pay | Admitting: Obstetrics & Gynecology

## 2012-04-03 DIAGNOSIS — N6009 Solitary cyst of unspecified breast: Secondary | ICD-10-CM

## 2012-04-16 ENCOUNTER — Telehealth: Payer: Self-pay | Admitting: Internal Medicine

## 2012-04-16 ENCOUNTER — Telehealth: Payer: Self-pay | Admitting: *Deleted

## 2012-04-16 DIAGNOSIS — I10 Essential (primary) hypertension: Secondary | ICD-10-CM

## 2012-04-16 MED ORDER — LOSARTAN POTASSIUM 50 MG PO TABS: 50.0000 mg | ORAL_TABLET | Freq: Two times a day (BID) | ORAL | Status: DC

## 2012-04-16 NOTE — Telephone Encounter (Signed)
Caller: [name] [name]/Patient; PCP: Rene Paci; CB#: [phone]; ; ; Call regarding Blood Pressure; Patient was seen 03/27/12 and was started on Losartn. Patient feels like her blood pressure is staying 143/90 range. Patient denies blurry vision, headache. Patient reports her heartrate is around 88 or above and she is also concerned about this as well. Patient reports that the MD wants her SBP in the 130-140 range and patient was instructed to call back if it was not in this range. Emergent s/s of "Multiple elevated blood pressure reading without other symptoms and no previous work-up OR reading exceed expected range defined by treatment plan" positive per Hypertension, Diagnosed or Suspected guideline. Patient reports that she is unable to come in for another appointment at this time. Home care advice given. Informed patient a note would be sent to the provider and someone will call her back if they feel she needs to change the dosage of her medication.

## 2012-04-16 NOTE — Telephone Encounter (Signed)
Increase losartan to 50mg  BID in addition to ongoing coreg - HR 80-100s is ok

## 2012-04-16 NOTE — Telephone Encounter (Signed)
Pt states will need new rx for losartan sent to pharmacy. Inform pt will send... 04/16/12@3 :00pm/LMB

## 2012-04-16 NOTE — Telephone Encounter (Signed)
Notified pt with md response... 04/16/12@2 :54pm/LMB

## 2012-04-17 ENCOUNTER — Other Ambulatory Visit (INDEPENDENT_AMBULATORY_CARE_PROVIDER_SITE_OTHER): Payer: BC Managed Care – PPO

## 2012-04-17 ENCOUNTER — Encounter: Payer: Self-pay | Admitting: *Deleted

## 2012-04-17 ENCOUNTER — Encounter: Payer: Self-pay | Admitting: Internal Medicine

## 2012-04-17 ENCOUNTER — Ambulatory Visit (INDEPENDENT_AMBULATORY_CARE_PROVIDER_SITE_OTHER): Payer: BC Managed Care – PPO | Admitting: Internal Medicine

## 2012-04-17 VITALS — BP 162/88 | HR 88 | Resp 18

## 2012-04-17 DIAGNOSIS — I10 Essential (primary) hypertension: Secondary | ICD-10-CM

## 2012-04-17 DIAGNOSIS — F419 Anxiety disorder, unspecified: Secondary | ICD-10-CM

## 2012-04-17 DIAGNOSIS — F411 Generalized anxiety disorder: Secondary | ICD-10-CM

## 2012-04-17 LAB — BASIC METABOLIC PANEL
CO2: 29 mEq/L (ref 19–32)
Calcium: 9.5 mg/dL (ref 8.4–10.5)
Creatinine, Ser: 0.9 mg/dL (ref 0.4–1.2)
GFR: 89.86 mL/min (ref >60.00)
Glucose, Bld: 85 mg/dL (ref 70–99)
Sodium: 140 mEq/L (ref 135–145)

## 2012-04-17 MED ORDER — METOPROLOL TARTRATE 50 MG PO TABS: 50.0000 mg | ORAL_TABLET | Freq: Two times a day (BID) | ORAL | Status: DC

## 2012-04-17 MED ORDER — ALPRAZOLAM 0.25 MG PO TABS: 0.2500 mg | ORAL_TABLET | Freq: Three times a day (TID) | ORAL | Status: DC | PRN

## 2012-04-17 MED ORDER — FUROSEMIDE 20 MG PO TABS: 20.0000 mg | ORAL_TABLET | Freq: Every day | ORAL | Status: DC | PRN

## 2012-04-17 NOTE — Assessment & Plan Note (Signed)
BP Readings from Last 3 Encounters:  04/17/12 162/88  03/27/12 140/82  02/28/12 132/90   Add losartan 03/2012 to ongoing coreg -  Changed hctz to lasix for edema 02/2012 (which has improved) Change coreg to metoprolol and continue ARB bid (max dose losartan) Also check labs

## 2012-04-17 NOTE — Progress Notes (Signed)
  Subjective:    Patient ID: [name] [name], female    DOB: 1961/08/25, 51 y.o.   MRN: [id number]  HPI  Here for follow up Hypertension  also reviewed chronic medical issues:  allergy and sinus symptoms  -reports compliance with ongoing medical treatment and no changes in medication dose or frequency. denies adverse side effects related to current therapy.   MS - stable on current tx - no flares of MS - no med changes  HTN - see above - reports compliance with ongoing medical treatment and no changes in medication dose or frequency. denies adverse side effects related to current therapy.   Past Medical History  Diagnosis Date  . METABOLIC SYNDROME X   . OBESITY, TRUNCAL   . CONSTIPATION, CHRONIC   . KELOID   . Multiple sclerosis   . Allergic rhinitis due to other allergen   . HYPERTENSION   . HYPERLIPIDEMIA   . GERD   . Angioedema     ACEI    Review of Systems Constitutional: Positive for fatigue. Negative for fever.  HENT: Positive for congestion. Negative for ear pain and ear discharge.   Respiratory: Negative for cough and shortness of breath.   Cardiovascular: Negative for chest pain and leg swelling.  Neurological: Negative for headaches.      Objective:   Physical Exam  BP 162/88  Pulse 88  Resp 18 Wt Readings from Last 3 Encounters:  03/27/12 216 lb (97.977 kg)  02/28/12 220 lb 3.2 oz (99.882 kg)  12/26/11 212 lb 12.8 oz (96.525 kg)   Constitutional: She is overweight, but appears well-developed and well-nourished. No distress. Neck: Normal range of motion. Neck supple. No JVD present. No thyromegaly present.  Cardiovascular: Normal rate, regular rhythm and normal heart sounds.  No murmur heard. no BLE edema. Pulmonary/Chest: Effort normal and breath sounds normal. No respiratory distress. She has no wheezes.  Neurological: She is alert and oriented to person, place, and time. No cranial nerve deficit. Coordination normal.  Skin: Skin is warm and dry. No  rash noted. No erythema.  Psyc: min anxious  Lab Results  Component Value Date   WBC 8.0 11/06/2011   HGB 13.4 11/06/2011   HCT 39.5 11/06/2011   PLT 273.0 11/06/2011   GLUCOSE 105* 11/06/2011   CHOL 208* 11/06/2011   TRIG 226.0* 11/06/2011   HDL 50.60 11/06/2011   LDLDIRECT 122.7 11/06/2011   LDLCALC 126* 06/22/2008   ALT 20 11/06/2011   AST 18 11/06/2011   NA 139 11/06/2011   K 3.9 11/06/2011   CL 101 11/06/2011   CREATININE 0.8 11/06/2011   BUN 17 11/06/2011   CO2 30 11/06/2011   TSH 1.75 11/06/2011   HGBA1C 5.9 12/30/2007    Assessment & Plan:   See problem list. Medications and labs reviewed today.  Anxiety - complicating same - use xanax prn

## 2012-04-17 NOTE — Patient Instructions (Signed)
It was good to see you today. Stop coreg to metoprolol - take 50mg  2x/day continue losartan 50mg  2x/day call if blood pressure still >130/85 -  Uses xanax as needed for stress/anxiety Your prescription(s) have been submitted to your pharmacy. Please take as directed and contact our office if you believe you are having problem(s) with the medication(s). Test(s) ordered today. Your results will be called to you after review (48-72hours after test completion). If any changes need to be made, you will be notified at that time. Please schedule followup in 2 weeks to recheck blood pressure, call sooner if problems. Work note for today and tomorrow

## 2012-04-18 ENCOUNTER — Ambulatory Visit: Payer: Self-pay | Admitting: Internal Medicine

## 2012-04-18 ENCOUNTER — Other Ambulatory Visit: Payer: Self-pay

## 2012-04-18 DIAGNOSIS — I10 Essential (primary) hypertension: Secondary | ICD-10-CM

## 2012-04-18 MED ORDER — LOSARTAN POTASSIUM 50 MG PO TABS: 50.0000 mg | ORAL_TABLET | Freq: Two times a day (BID) | ORAL | Status: DC

## 2012-04-29 ENCOUNTER — Encounter: Payer: Self-pay | Admitting: Internal Medicine

## 2012-04-29 ENCOUNTER — Encounter: Payer: Self-pay | Admitting: *Deleted

## 2012-04-29 ENCOUNTER — Ambulatory Visit (INDEPENDENT_AMBULATORY_CARE_PROVIDER_SITE_OTHER): Payer: BC Managed Care – PPO | Admitting: Internal Medicine

## 2012-04-29 VITALS — BP 120/90 | HR 73 | Temp 98.3°F | Ht 65.0 in

## 2012-04-29 DIAGNOSIS — F411 Generalized anxiety disorder: Secondary | ICD-10-CM

## 2012-04-29 DIAGNOSIS — F419 Anxiety disorder, unspecified: Secondary | ICD-10-CM | POA: Insufficient documentation

## 2012-04-29 DIAGNOSIS — E65 Localized adiposity: Secondary | ICD-10-CM

## 2012-04-29 DIAGNOSIS — I10 Essential (primary) hypertension: Secondary | ICD-10-CM

## 2012-04-29 MED ORDER — HYDROCHLOROTHIAZIDE 25 MG PO TABS: 25.0000 mg | ORAL_TABLET | Freq: Every day | ORAL | Status: DC

## 2012-04-29 NOTE — Progress Notes (Signed)
  Subjective:    Patient ID: [name] [name], female    DOB: 1961-07-11, 51 y.o.   MRN: [id number]  HPI  Here for follow up Hypertension  also reviewed chronic medical issues:  allergy and sinus symptoms  -reports compliance with ongoing medical treatment and no changes in medication dose or frequency. denies adverse side effects related to current therapy.   MS - stable on current tx - no flares of MS - no med changes  HTN - see above - reports compliance with ongoing medical treatment and no changes in medication dose or frequency. denies adverse side effects related to current therapy.   Past Medical History  Diagnosis Date  . METABOLIC SYNDROME X   . OBESITY, TRUNCAL   . CONSTIPATION, CHRONIC   . KELOID   . Multiple sclerosis   . Allergic rhinitis due to other allergen   . HYPERTENSION   . HYPERLIPIDEMIA   . GERD   . Angioedema     ACEI    Review of Systems Constitutional: Positive for fatigue. Negative for fever.  HENT: Negative for ear pain and ear discharge.   Respiratory: Negative for cough and shortness of breath.   Cardiovascular: Negative for chest pain and leg swelling.  Neurological: Negative for headaches.      Objective:   Physical Exam   BP 120/90  Pulse 73  Temp 98.3 F (36.8 C) (Oral)  Ht 5\' 5"  (1.651 m)  SpO2 97% Wt Readings from Last 3 Encounters:  03/27/12 216 lb (97.977 kg)  02/28/12 220 lb 3.2 oz (99.882 kg)  12/26/11 212 lb 12.8 oz (96.525 kg)   Constitutional: She is overweight, but appears well-developed and well-nourished. No distress. Neck: Normal range of motion. Neck supple. No JVD present. No thyromegaly present.  Cardiovascular: Normal rate, regular rhythm and normal heart sounds.  No murmur heard. no BLE edema. Pulmonary/Chest: Effort normal and breath sounds normal. No respiratory distress. She has no wheezes.  Neurological: She is alert and oriented to person, place, and time. No cranial nerve deficit. Coordination normal.    Skin: Skin is warm and dry. No rash noted. No erythema.  Psyc: min anxious  Lab Results  Component Value Date   WBC 8.0 11/06/2011   HGB 13.4 11/06/2011   HCT 39.5 11/06/2011   PLT 273.0 11/06/2011   GLUCOSE 85 04/17/2012   CHOL 208* 11/06/2011   TRIG 226.0* 11/06/2011   HDL 50.60 11/06/2011   LDLDIRECT 122.7 11/06/2011   LDLCALC 126* 06/22/2008   ALT 20 11/06/2011   AST 18 11/06/2011   NA 140 04/17/2012   K 3.6 04/17/2012   CL 103 04/17/2012   CREATININE 0.9 04/17/2012   BUN 9 04/17/2012   CO2 29 04/17/2012   TSH 1.75 11/06/2011   HGBA1C 5.9 12/30/2007    Assessment & Plan:   See problem list. Medications and labs reviewed today.

## 2012-04-29 NOTE — Patient Instructions (Signed)
It was good to see you today. Change lasix back back to HCTZ 25mg  daily - let us know if you need refill on this medication continue metoprolol 50mg  2x/day and losartan 50mg  2x/day call if blood pressure  >130/85 -  conitnue xanax if needed for stress/anxiety Please schedule followup in 4-6 weeks to recheck blood pressure, call sooner if problems. Work note for today as requested Continue to work on lifestyle changes as discussed (low fat, low carb, increased protein diet; improved exercise efforts; weight loss) to control sugar, blood pressure and cholesterol levels and/or reduce risk of developing other medical problems.

## 2012-04-29 NOTE — Assessment & Plan Note (Signed)
  Wt Readings from Last 3 Encounters:  03/27/12 216 lb (97.977 kg)  02/28/12 220 lb 3.2 oz (99.882 kg)  12/26/11 212 lb 12.8 oz (96.525 kg)  The patient is asked to make an attempt to improve diet and exercise patterns to aid in medical management of this problem.

## 2012-04-29 NOTE — Assessment & Plan Note (Signed)
BP Readings from Last 3 Encounters:  04/29/12 120/90  04/17/12 162/88  03/27/12 140/82   Added losartan 03/2012 to coreg -  Changed hctz to lasix for edema 02/2012- but will resume hctz at this time, since edema has resolved Changed coreg to metoprolol 04/17/12 continue ARB bid -max dose losartan Continue diet control and exercise

## 2012-05-09 ENCOUNTER — Other Ambulatory Visit: Payer: Self-pay | Admitting: Internal Medicine

## 2012-05-16 ENCOUNTER — Telehealth: Payer: Self-pay | Admitting: Internal Medicine

## 2012-05-16 NOTE — Telephone Encounter (Signed)
Caller: Latanya/Patient; Patient Name: [name] [name]; PCP: Rene Paci (Adults only); Best Callback Phone Number: [phone]. Caller states, "I just have a quick question about Multiple Sclerosis and joint pain." She was diagnosed in 2002. Caller reports some joint pain "all over" this am and asking if this is related to Multiple Sclerosis. Caller given information re same from [place] Information files. Caller denies need for an appointment. Advised to callback as needed any new or worsening of symptoms. She is agreeable.

## 2012-05-27 ENCOUNTER — Other Ambulatory Visit: Payer: Self-pay | Admitting: Obstetrics & Gynecology

## 2012-05-27 ENCOUNTER — Ambulatory Visit
Admission: RE | Admit: 2012-05-27 | Discharge: 2012-05-27 | Disposition: A | Discharge status: Home / Self Care | Payer: BC Managed Care – PPO | Source: Ambulatory Visit | Attending: Obstetrics & Gynecology | Admitting: Obstetrics & Gynecology

## 2012-05-27 DIAGNOSIS — N6009 Solitary cyst of unspecified breast: Secondary | ICD-10-CM

## 2012-06-14 ENCOUNTER — Ambulatory Visit: Payer: Self-pay | Admitting: Internal Medicine

## 2012-06-17 ENCOUNTER — Ambulatory Visit (INDEPENDENT_AMBULATORY_CARE_PROVIDER_SITE_OTHER): Payer: BC Managed Care – PPO | Admitting: Internal Medicine

## 2012-06-17 ENCOUNTER — Encounter: Payer: Self-pay | Admitting: Internal Medicine

## 2012-06-17 VITALS — BP 112/74 | HR 82 | Temp 98.3°F | Ht 65.0 in | Wt 214.0 lb

## 2012-06-17 DIAGNOSIS — G35 Multiple sclerosis: Secondary | ICD-10-CM

## 2012-06-17 DIAGNOSIS — I10 Essential (primary) hypertension: Secondary | ICD-10-CM

## 2012-06-17 DIAGNOSIS — E65 Localized adiposity: Secondary | ICD-10-CM

## 2012-06-17 DIAGNOSIS — R209 Unspecified disturbances of skin sensation: Secondary | ICD-10-CM

## 2012-06-17 DIAGNOSIS — R200 Anesthesia of skin: Secondary | ICD-10-CM

## 2012-06-17 MED ORDER — PREDNISONE (PAK) 10 MG PO TABS: 10.0000 mg | ORAL_TABLET | ORAL | Status: DC

## 2012-06-17 NOTE — Assessment & Plan Note (Signed)
BP Readings from Last 3 Encounters:  06/17/12 112/74  04/29/12 120/90  04/17/12 162/88  exacerbated by stress - currently well controlled The current medical regimen is effective;  continue present plan and medications.

## 2012-06-17 NOTE — Assessment & Plan Note (Signed)
Stable without flares for many years until fall 2013 - not currently on medical maintenance treatment See above regarding right side arm greater than leg tingling as mild flare Declines need for MRI brain at this time, but she agrees to call if symptoms worse or unimproved with prednisone taper Follows every 6-12 months with neurology

## 2012-06-17 NOTE — Progress Notes (Signed)
  Subjective:    Patient ID: [name] [name], female    DOB: 1961/03/05, 51 y.o.   MRN: [id number]  HPI Complains of numbness and tingling along the right side of her body, arm greater than leg Onset 2 weeks ago, gradually progressive Symptoms typical of prior MS flares Denies headache, weakness, falls or vision changes Reports inadequate funds for MRI of brain at this time, scheduled for routine MRI followup next year with neurology  Past Medical History  Diagnosis Date  . METABOLIC SYNDROME X   . OBESITY, TRUNCAL   . CONSTIPATION, CHRONIC   . KELOID   . Multiple sclerosis   . Allergic rhinitis due to other allergen   . HYPERTENSION   . HYPERLIPIDEMIA   . GERD   . Angioedema     ACEI     Review of Systems  Eyes: Negative for pain and visual disturbance.  Musculoskeletal: Negative for myalgias, back pain, joint swelling and gait problem.  Neurological: Positive for numbness. Negative for facial asymmetry, weakness and headaches.       Objective:   Physical Exam  BP 112/74  Pulse 82  Temp 98.3 F (36.8 C) (Oral)  Ht 5\' 5"  (1.651 m)  Wt 214 lb (97.07 kg)  BMI 35.61 kg/m2  SpO2 97% Wt Readings from Last 3 Encounters:  06/17/12 214 lb (97.07 kg)  03/27/12 216 lb (97.977 kg)  02/28/12 220 lb 3.2 oz (99.882 kg)   Constitutional: She appears well-developed and well-nourished. No distress.  Neck: Normal range of motion. Neck supple. No JVD present. No thyromegaly present.  Cardiovascular: Normal rate, regular rhythm and normal heart sounds.  No murmur heard. No BLE edema. Pulmonary/Chest: Effort normal and breath sounds normal. No respiratory distress. She has no wheezes.  Musculoskeletal: Normal range of motion, no joint effusions. No gross deformities Neurological: She is alert and oriented to person, place, and time. No cranial nerve deficit. No gross motor deficits. Coordination, balance, speech, recall, gait are normal.  bilateral hand grip strength equal Skin:  Skin is warm and dry. No rash noted. No erythema.  Psychiatric: She has a normal mood and affect. Her behavior is normal. Judgment and thought content normal.   Lab Results  Component Value Date   WBC 8.0 11/06/2011   HGB 13.4 11/06/2011   HCT 39.5 11/06/2011   PLT 273.0 11/06/2011   GLUCOSE 85 04/17/2012   CHOL 208* 11/06/2011   TRIG 226.0* 11/06/2011   HDL 50.60 11/06/2011   LDLDIRECT 122.7 11/06/2011   LDLCALC 126* 06/22/2008   ALT 20 11/06/2011   AST 18 11/06/2011   NA 140 04/17/2012   K 3.6 04/17/2012   CL 103 04/17/2012   CREATININE 0.9 04/17/2012   BUN 9 04/17/2012   CO2 29 04/17/2012   TSH 1.75 11/06/2011   HGBA1C 5.9 12/30/2007      Assessment & Plan:   Tingling R arm>leg -suspect multiple sclerosis flare Good motor strength and balance on exam Declines MRI brain at this time due to $ restraints Treatment with pred taper, pt agrees to call if symptoms worse or unimproved

## 2012-06-17 NOTE — Patient Instructions (Addendum)
It was good to see you today. We have reviewed your prior records including labs and tests today If your tingling does not improved with prednisone taper, call for MRI brain as we discussed to further evaluate this MS flare Prednisone taper over next 6 days, repeat if needed for symptoms -Your prescription(s) have been submitted to your pharmacy. Please take as directed and contact our office if you believe you are having problem(s) with the medication(s). Other medications reviewed, no additional changes. Your blood pressure is perfect, keep up the good work! Application for handicap placard signed today

## 2012-06-17 NOTE — Assessment & Plan Note (Signed)
  Wt Readings from Last 3 Encounters:  06/17/12 214 lb (97.07 kg)  03/27/12 216 lb (97.977 kg)  02/28/12 220 lb 3.2 oz (99.882 kg)  The patient is asked to make an attempt to improve diet and exercise patterns to aid in medical management of this problem.  Weight loss trend reviewed and commended

## 2012-06-18 ENCOUNTER — Encounter: Payer: Self-pay | Admitting: Internal Medicine

## 2012-07-02 ENCOUNTER — Other Ambulatory Visit: Payer: Self-pay | Admitting: Internal Medicine

## 2012-07-02 NOTE — Telephone Encounter (Signed)
Caller: Erisa/Patient; Patient Name: [name] [name]; PCP: Rene Paci (Adults only); Best Callback Phone Number: [phone].  Call regarding a script for an appetite suppressant.  Pt states she had a script for a "diet" pill and she tore it up thinking she didn't need/want it.  She has changed her mind and wants the try the medication.  No triage. Unable to locate script in Epic.  PLEASE FOLLOW UP WITH PT ABOUT SCRIPT. Thank you!

## 2012-07-02 NOTE — Telephone Encounter (Addendum)
Ok for phentermine  - but will need to have another provider sign as I am away this week

## 2012-07-03 MED ORDER — PHENTERMINE HCL 37.5 MG PO TABS: 37.5000 mg | ORAL_TABLET | Freq: Every day | ORAL | Status: DC

## 2012-07-03 NOTE — Telephone Encounter (Signed)
Done hardcopy to robin  

## 2012-07-04 NOTE — Telephone Encounter (Signed)
Called the patient informed to pickup prescription requested at the front desk.

## 2012-07-19 ENCOUNTER — Other Ambulatory Visit: Payer: Self-pay | Admitting: Internal Medicine

## 2012-07-25 ENCOUNTER — Ambulatory Visit (INDEPENDENT_AMBULATORY_CARE_PROVIDER_SITE_OTHER): Payer: BC Managed Care – PPO | Admitting: Internal Medicine

## 2012-07-25 ENCOUNTER — Encounter: Payer: Self-pay | Admitting: Internal Medicine

## 2012-07-25 VITALS — BP 118/78 | HR 80 | Ht 65.0 in | Wt 219.0 lb

## 2012-07-25 DIAGNOSIS — N760 Acute vaginitis: Secondary | ICD-10-CM

## 2012-07-25 DIAGNOSIS — M7660 Achilles tendinitis, unspecified leg: Secondary | ICD-10-CM

## 2012-07-25 DIAGNOSIS — M7662 Achilles tendinitis, left leg: Secondary | ICD-10-CM

## 2012-07-25 DIAGNOSIS — I10 Essential (primary) hypertension: Secondary | ICD-10-CM

## 2012-07-25 MED ORDER — DICLOFENAC SODIUM 75 MG PO TBEC: 75.0000 mg | DELAYED_RELEASE_TABLET | Freq: Two times a day (BID) | ORAL | Status: DC

## 2012-07-25 MED ORDER — FLUCONAZOLE 150 MG PO TABS: 150.0000 mg | ORAL_TABLET | Freq: Once | ORAL | Status: DC

## 2012-07-25 NOTE — Progress Notes (Signed)
  Subjective:    Patient ID: [name] [name], female    DOB: [birth year], 51 y.o.   MRN: [id number]  HPI  complains of L posterior heel pain Onset 2 weeks ago, gradually worse Denies overuse or direct trauma Not associated with redness, swelling or injury Symptoms worse with dorsi flexion Difficult weight bearing and ambulation because of pain  Past Medical History  Diagnosis Date  . METABOLIC SYNDROME X   . OBESITY, TRUNCAL   . CONSTIPATION, CHRONIC   . KELOID   . Multiple sclerosis   . Allergic rhinitis due to other allergen   . HYPERTENSION   . HYPERLIPIDEMIA   . GERD   . Angioedema     ACEI    Review of Systems  Constitutional: Negative for fever, fatigue and unexpected weight change.  Cardiovascular: Negative for chest pain, palpitations and leg swelling.  Genitourinary: Positive for vaginal discharge (itch).  Musculoskeletal: Positive for gait problem. Negative for myalgias, back pain and joint swelling.  Neurological: Negative for dizziness and headaches.       Objective:   Physical Exam  BP 118/78  Pulse 80  Ht 5\' 5"  (1.651 m)  Wt 219 lb (99.338 kg)  BMI 36.44 kg/m2  SpO2 99% GEN: NAD MSkel: L foot - mild swelling and warmth at distal Achilles, but heel pad not inflammed - passive flexion/ext intact but pain with passive stretch on dorsiflexion Skin: no redness or laceration GU: defer     Assessment & Plan:   L heel pain - Achilles tendonitis on exam - educated on dx Rest and ice, NSAIDs - erx done Pt will call if worse or unimproved in few days for refer to ortho if needed   symptomatic vaginitis - Diflucan - erx done

## 2012-07-25 NOTE — Patient Instructions (Signed)
It was good to see you today. Ice your painful heel for 10 minutes 3x/day and avoid high impact jumping or fast walking until pain improves Use diclofenac pill with food 2x/day until gone - Your prescription(s) have been submitted to your pharmacy. Please take as directed and contact our office if you believe you are having problem(s) with the medication(s). Also Diflucan as requested Call if pain worse or unimproved by Monday (4 days) for refer to orthopedic specialist if needed  Achilles Tendinitis Tendinitis a swelling and soreness of the tendon. The pain in the tendon (cord-like structure which attaches muscle to bone) is produced by tiny tears and the inflammation present in that tendon. It commonly occurs at the shoulders, heels, and elbows. It is usually caused by overusing the tendon and joint involved. Achilles tendinitis involves the Achilles tendon. This is the large tendon in the back of the leg just above the foot. It attaches the large muscles of the lower leg to the heel bone (called calcaneus).   This diagnosis (learning what is wrong) is made by examination. X-rays will be generally be normal if only tendinitis is present. HOME CARE INSTRUCTIONS    Apply ice to the injury for 15 to 20 minutes, 3 to 4 times per day. Put the ice in a plastic bag and place a towel between the bag of ice and your skin.   Try to avoid use other than gentle range of motion while the tendon is painful. Do not resume use until instructed by your caregiver. Then begin use gradually. Do not increase use to the point of pain. If pain does develop, decrease use and continue the above measures. Gradually increase activities that do not cause discomfort until you gradually achieve normal use.   Only take over-the-counter or prescription medicines for pain, discomfort, or fever as directed by your caregiver.  SEEK MEDICAL CARE IF:    Your pain and swelling increase or pain is uncontrolled with medications.    You develop new, unexplained problems (symptoms) or an increase of the symptoms that brought you to your caregiver.   You develop an inability to move your toes or foot, develop warmth and swelling in your foot, or begin running an unexplained temperature.  MAKE SURE YOU:    Understand these instructions.   Will watch your condition.   Will get help right away if you are not doing well or get worse.  Document Released: 06/14/2005 Document Revised: 11/27/2011 Document Reviewed: 04/22/2008 [facility] Patient Information 2013 [place], [place].

## 2012-07-25 NOTE — Assessment & Plan Note (Signed)
BP Readings from Last 3 Encounters:  07/25/12 118/78  06/17/12 112/74  04/29/12 120/90  exacerbated by stress - currently well controlled Potential risk of NSAIDs causing exacerbation reviewed, we feel the benefit outweighs potential risk and agreed to short-term use of NSAID therapy for tendonitis The current medical regimen is effective;  continue present plan and medications.

## 2012-07-29 ENCOUNTER — Ambulatory Visit (INDEPENDENT_AMBULATORY_CARE_PROVIDER_SITE_OTHER): Payer: BC Managed Care – PPO | Admitting: Internal Medicine

## 2012-07-29 ENCOUNTER — Encounter: Payer: Self-pay | Admitting: Internal Medicine

## 2012-07-29 VITALS — BP 122/68 | HR 96 | Temp 98.4°F | Resp 16 | Wt 219.2 lb

## 2012-07-29 DIAGNOSIS — H9201 Otalgia, right ear: Secondary | ICD-10-CM

## 2012-07-29 DIAGNOSIS — H9209 Otalgia, unspecified ear: Secondary | ICD-10-CM

## 2012-07-29 MED ORDER — PSEUDOEPHEDRINE HCL 30 MG PO TABS: 30.0000 mg | ORAL_TABLET | ORAL | Status: DC | PRN

## 2012-07-29 MED ORDER — ANTIPYRINE-BENZOCAINE 5.4-1.4 % OT SOLN: 3.0000 [drp] | OTIC | Status: DC | PRN

## 2012-07-29 NOTE — Patient Instructions (Addendum)
It was good to see you today. No infection on exam-  use ear drops as needed and low dose Sudafed for decongestant to "unplug" as needed Your prescription(s) have been submitted to your pharmacy. Please take as directed and contact our office if you believe you are having problem(s) with the medication(s). Other Medications reviewed and updated, no changes at this time.  Please schedule followup in 3-4 months to monitor blood pressure, call sooner if problems.

## 2012-07-29 NOTE — Progress Notes (Signed)
  Subjective:    Patient ID: [name] [name], female    DOB: 1960-10-02, 51 y.o.   MRN: [id number]  HPI R ear pain Precipitated by cold wind exposure 72h ago Feels "plugged"  Heel/foot pain improved with diclofenac and ice  Past Medical History  Diagnosis Date  . METABOLIC SYNDROME X   . OBESITY, TRUNCAL   . CONSTIPATION, CHRONIC   . KELOID   . Multiple sclerosis   . Allergic rhinitis due to other allergen   . HYPERTENSION   . HYPERLIPIDEMIA   . GERD   . Angioedema     ACEI    Review of Systems  HENT: Positive for ear pain. Negative for hearing loss, congestion, sneezing, postnasal drip, sinus pressure and ear discharge.        Objective:   Physical Exam BP 122/68  Pulse 96  Temp 98.4 F (36.9 C) (Oral)  Resp 16  Wt 219 lb 4 oz (99.451 kg)  SpO2 97% Wt Readings from Last 3 Encounters:  07/29/12 219 lb 4 oz (99.451 kg)  07/25/12 219 lb (99.338 kg)  06/17/12 214 lb (97.07 kg)   Constitutional: She appears well-developed and well-nourished. No distress.  HENT: Head: Normocephalic and atraumatic. No sinus tenderness. Ears: no edema or swelling pinna. mildly red R ear canal without debris -B TMs ok, no erythema - slight clear effusion; Nose: Nose normal. Mouth/Throat: Oropharynx is clear and moist. No oropharyngeal exudate.  Eyes: Conjunctivae and EOM are normal. Pupils are equal, round, and reactive to light. No scleral icterus.  Neck: Normal range of motion. Neck supple. No JVD or LAD present. No thyromegaly present.  Cardiovascular: Normal rate, regular rhythm and normal heart sounds.  No murmur heard. No BLE edema. Pulmonary/Chest: Effort normal and breath sounds normal. No respiratory distress. She has no wheezes.   Lab Results  Component Value Date   WBC 8.0 11/06/2011   HGB 13.4 11/06/2011   HCT 39.5 11/06/2011   PLT 273.0 11/06/2011   GLUCOSE 85 04/17/2012   CHOL 208* 11/06/2011   TRIG 226.0* 11/06/2011   HDL 50.60 11/06/2011   LDLDIRECT 122.7 11/06/2011   LDLCALC 126* 06/22/2008   ALT 20 11/06/2011   AST 18 11/06/2011   NA 140 04/17/2012   K 3.6 04/17/2012   CL 103 04/17/2012   CREATININE 0.9 04/17/2012   BUN 9 04/17/2012   CO2 29 04/17/2012   TSH 1.75 11/06/2011   HGBA1C 5.9 12/30/2007        Assessment & Plan:   r ear pain - advise topical analgesic and use of low dose decongestant as needed

## 2012-08-08 ENCOUNTER — Telehealth: Payer: Self-pay | Admitting: *Deleted

## 2012-08-08 MED ORDER — DICLOFENAC SODIUM 75 MG PO TBEC: 75.0000 mg | DELAYED_RELEASE_TABLET | Freq: Two times a day (BID) | ORAL | Status: DC

## 2012-08-08 NOTE — Telephone Encounter (Signed)
Pt states she is still having problem with her heel. Wanting to know what md recommend. Really don't want to see specialist because her co-pay will be $75.00/lmb

## 2012-08-08 NOTE — Telephone Encounter (Signed)
Called pt no answer LMOM RTC.../lmb 

## 2012-08-08 NOTE — Telephone Encounter (Signed)
Ok to resume voltaren 2x/d x 1 month - erx done continue stretches and ice as instructed at OV for same-  Will pt go to physical therapy?

## 2012-08-08 NOTE — Telephone Encounter (Signed)
Notified pt with md recommendations. Pt decline PT for right now will call back if sxs doesn't improve...Raechel Chute

## 2012-08-09 NOTE — Telephone Encounter (Signed)
Ok thanks 

## 2012-09-12 ENCOUNTER — Other Ambulatory Visit: Payer: Self-pay | Admitting: Internal Medicine

## 2012-09-20 ENCOUNTER — Other Ambulatory Visit: Payer: Self-pay | Admitting: *Deleted

## 2012-09-20 NOTE — Telephone Encounter (Signed)
Received fax pt needing PA on her pantoprazole. Contacted insurance they fax over PA form was completed & fax back waiting on PA status...Raechel Chute

## 2012-09-25 NOTE — Telephone Encounter (Signed)
Called insurance back to check status stating currently in process will fax md within 24 hours...Raechel Chute

## 2012-09-30 ENCOUNTER — Telehealth: Payer: Self-pay | Admitting: *Deleted

## 2012-09-30 DIAGNOSIS — Z0000 Encounter for general adult medical examination without abnormal findings: Secondary | ICD-10-CM

## 2012-09-30 NOTE — Telephone Encounter (Signed)
Message copied by Deatra James on Mon Sep 30, 2012 10:27 AM ------      Message from: Etheleen Sia      Created: Mon Sep 30, 2012  9:24 AM      Regarding: LAB       PHYSICAL LABS FOR FEB

## 2012-09-30 NOTE — Telephone Encounter (Signed)
No rx "detox" available -

## 2012-09-30 NOTE — Telephone Encounter (Signed)
Received staff msg needing cpx labs entered...lmb 

## 2012-09-30 NOTE — Telephone Encounter (Signed)
Pt is wanting to know can md prescribe or recommend something to detox her system. She need her system clean out...[name] [name]

## 2012-10-01 NOTE — Telephone Encounter (Signed)
Received PA med has been approve. Notified pharmacy spoke with Jonny Ruiz gave status...Raechel Chute

## 2012-10-01 NOTE — Telephone Encounter (Signed)
Notified pt with md response.../lmb 

## 2012-10-08 ENCOUNTER — Encounter: Payer: Self-pay | Admitting: Internal Medicine

## 2012-10-08 ENCOUNTER — Encounter: Payer: Self-pay | Admitting: *Deleted

## 2012-10-08 ENCOUNTER — Ambulatory Visit (INDEPENDENT_AMBULATORY_CARE_PROVIDER_SITE_OTHER): Payer: BC Managed Care – PPO | Admitting: Internal Medicine

## 2012-10-08 VITALS — BP 112/78 | HR 80 | Temp 98.5°F | Ht 65.0 in | Wt 221.8 lb

## 2012-10-08 DIAGNOSIS — G35 Multiple sclerosis: Secondary | ICD-10-CM

## 2012-10-08 DIAGNOSIS — J309 Allergic rhinitis, unspecified: Secondary | ICD-10-CM

## 2012-10-08 DIAGNOSIS — J029 Acute pharyngitis, unspecified: Secondary | ICD-10-CM

## 2012-10-08 DIAGNOSIS — I10 Essential (primary) hypertension: Secondary | ICD-10-CM

## 2012-10-08 MED ORDER — PANTOPRAZOLE SODIUM 40 MG PO TBEC: 40.0000 mg | DELAYED_RELEASE_TABLET | Freq: Every day | ORAL | Status: DC

## 2012-10-08 MED ORDER — PHENYLEPH-PROMETHAZINE-COD 5-6.25-10 MG/5ML PO SYRP: 5.0000 mL | ORAL_SOLUTION | ORAL | Status: DC | PRN

## 2012-10-08 NOTE — Patient Instructions (Addendum)
It was good to see you today. If you develop worsening symptoms or fever, call and we can reconsider antibiotics, but it does not appear necessary to use antibiotics at this time. prescription cough syrup - Your prescription(s) have been submitted to your pharmacy. Please take as directed and contact our office if you believe you are having problem(s) with the medication(s). take tylenol every 6 hours for aches, pain and fever symptoms as discussed Hydrate, rest and call if worse or unimproved Work excuse note for today and tomorrow as discussedAntibiotic Nonuse  Your caregiver felt that the infection or problem was not one that would be helped with an antibiotic. Infections may be caused by viruses or bacteria. Only a caregiver can tell which one of these is the likely cause of an illness. A cold is the most common cause of infection in both adults and children. A cold is a virus. Antibiotic treatment will have no effect on a viral infection. Viruses can lead to many lost days of work caring for sick children and many missed days of school. Children may catch as many as 10 "colds" or "flus" per year during which they can be tearful, cranky, and uncomfortable. The goal of treating a virus is aimed at keeping the ill person comfortable. Antibiotics are medications used to help the body fight bacterial infections. There are relatively few types of bacteria that cause infections but there are hundreds of viruses. While both viruses and bacteria cause infection they are very different types of germs. A viral infection will typically go away by itself within 7 to 10 days. Bacterial infections may spread or get worse without antibiotic treatment. Examples of bacterial infections are:  Sore throats (like strep throat or tonsillitis).   Infection in the lung (pneumonia).   Ear and skin infections.  Examples of viral infections are:  Colds or flus.   Most coughs and bronchitis.   Sore throats not caused  by Strep.   Runny noses.  It is often best not to take an antibiotic when a viral infection is the cause of the problem. Antibiotics can kill off the helpful bacteria that we have inside our body and allow harmful bacteria to start growing. Antibiotics can cause side effects such as allergies, nausea, and diarrhea without helping to improve the symptoms of the viral infection. Additionally, repeated uses of antibiotics can cause bacteria inside of our body to become resistant. That resistance can be passed onto harmful bacterial. The next time you have an infection it may be harder to treat if antibiotics are used when they are not needed. Not treating with antibiotics allows our own immune system to develop and take care of infections more efficiently. Also, antibiotics will work better for [place] when they are prescribed for bacterial infections. Treatments for a child that is ill may include:  Give extra fluids throughout the day to stay hydrated.   Get plenty of rest.   Only give your child over-the-counter or prescription medicines for pain, discomfort, or fever as directed by your caregiver.   The use of a cool mist humidifier may help stuffy noses.   Cold medications if suggested by your caregiver.  Your caregiver may decide to start you on an antibiotic if:  The problem you were seen for today continues for a longer length of time than expected.   You develop a secondary bacterial infection.  SEEK MEDICAL CARE IF:  Fever lasts longer than 5 days.   Symptoms continue to get worse  after 5 to 7 days or become severe.   Difficulty in breathing develops.   Signs of dehydration develop (poor drinking, rare urinating, dark colored urine).   Changes in behavior or worsening tiredness (listlessness or lethargy).  Document Released: 11/13/2001 Document Revised: 11/27/2011 Document Reviewed: 05/12/2009 [facility] Patient Information 2013 [place], [place].

## 2012-10-08 NOTE — Assessment & Plan Note (Signed)
BP Readings from Last 3 Encounters:  10/08/12 112/78  07/29/12 122/68  07/25/12 118/78  exacerbated by stress - currently well controlled Potential risk of decongestants causing exacerbation reviewed -we feel the benefit outweighs potential risk and agreed to short-term use of therapy for URI symptoms  The current medical regimen is effective;  continue present plan and medications.

## 2012-10-08 NOTE — Assessment & Plan Note (Signed)
Stable without flares for many years until fall 2013 -then resumed medical maintenance treatment No acute flare with viral symptoms - mild immunocompromise status due to same, but no indication for antibiotics at this time

## 2012-10-08 NOTE — Progress Notes (Signed)
  Subjective:    HPI  complains of cold symptoms  Onset <48h ago, wax/wane symptoms  associated with sore throat, mild headache and low grade fever Also myalgias and mild chest/nasal congestion Not seeking relief with OTC meds   Past Medical History  Diagnosis Date  . METABOLIC SYNDROME X   . OBESITY, TRUNCAL   . CONSTIPATION, CHRONIC   . KELOID   . Multiple sclerosis   . Allergic rhinitis due to other allergen   . HYPERTENSION   . HYPERLIPIDEMIA   . GERD   . Angioedema     ACEI    Review of Systems Constitutional: No fever or night sweats, no unexpected weight change Pulmonary: No pleurisy or hemoptysis Cardiovascular: No chest pain or palpitations     Objective:   Physical Exam BP 112/78  Pulse 80  Temp 98.5 F (36.9 C) (Oral)  Ht 5\' 5"  (1.651 m)  Wt 221 lb 12.8 oz (100.608 kg)  BMI 36.91 kg/m2  SpO2 97% GEN: nontoxic appearing -mild audible chest congestion HENT: NCAT, no sinus tenderness bilaterally, nares with clear discharge, oropharynx mod erythema, no exudate Eyes: Vision grossly intact, no conjunctivitis Lungs: Clear to auscultation without rhonchi or wheeze, no increased work of breathing Cardiovascular: Regular rate and rhythm, no bilateral edema      Assessment & Plan:  Viral URI >acute pharyngitis Cough, postnasal drip related to above hypertension  multiple sclerosis    Explained lack of efficacy for antibiotics in viral disease - pt will call for empiric antibiotics if symptom duration greater than 7 days Prescription cough suppression - new prescriptions done Symptomatic care with Tylenol or Advil, hydration and rest -  salt gargle advised as needed Work note

## 2012-10-10 ENCOUNTER — Telehealth: Payer: Self-pay | Admitting: *Deleted

## 2012-10-10 ENCOUNTER — Other Ambulatory Visit: Payer: Self-pay | Admitting: Internal Medicine

## 2012-10-10 DIAGNOSIS — J029 Acute pharyngitis, unspecified: Secondary | ICD-10-CM

## 2012-10-10 MED ORDER — FLUCONAZOLE 150 MG PO TABS: 150.0000 mg | ORAL_TABLET | Freq: Once | ORAL | Status: DC

## 2012-10-10 MED ORDER — AMOXICILLIN 875 MG PO TABS: 875.0000 mg | ORAL_TABLET | Freq: Two times a day (BID) | ORAL | Status: DC

## 2012-10-10 NOTE — Telephone Encounter (Signed)
Lucy, eRx sent in for Amoxil x 10 days [facility]

## 2012-10-10 NOTE — Telephone Encounter (Signed)
Notified pt rx sent to [facility].../lmb 

## 2012-10-10 NOTE — Telephone Encounter (Signed)
Left msg on vm was told to callback if she is not feeling better. Pt states she is getting worse and requesting md to rx antibiotic. Also want rx for diflucan because she normally get yeast infection after taking antibiotic...[name] [name]

## 2012-10-16 ENCOUNTER — Encounter: Payer: Self-pay | Admitting: Internal Medicine

## 2012-10-16 ENCOUNTER — Ambulatory Visit (INDEPENDENT_AMBULATORY_CARE_PROVIDER_SITE_OTHER): Payer: BC Managed Care – PPO | Admitting: Internal Medicine

## 2012-10-16 VITALS — BP 112/74 | HR 76 | Temp 98.3°F

## 2012-10-16 DIAGNOSIS — J019 Acute sinusitis, unspecified: Secondary | ICD-10-CM

## 2012-10-16 MED ORDER — FLUCONAZOLE 150 MG PO TABS: 150.0000 mg | ORAL_TABLET | Freq: Once | ORAL | Status: DC

## 2012-10-16 MED ORDER — LEVOFLOXACIN 750 MG PO TABS: 750.0000 mg | ORAL_TABLET | Freq: Every day | ORAL | Status: DC

## 2012-10-16 MED ORDER — PSEUDOEPHEDRINE HCL 30 MG PO TABS: 30.0000 mg | ORAL_TABLET | ORAL | Status: DC | PRN

## 2012-10-16 NOTE — Progress Notes (Signed)
  Subjective:   HPI  complains persisting head cold symptoms, ?sinusitus   Past Medical History  Diagnosis Date  . METABOLIC SYNDROME X   . OBESITY, TRUNCAL   . CONSTIPATION, CHRONIC   . KELOID   . Multiple sclerosis   . Allergic rhinitis due to other allergen   . HYPERTENSION   . HYPERLIPIDEMIA   . GERD   . Angioedema     ACEI    Review of Systems Constitutional: No night sweats, no unexpected weight change Pulmonary: No pleurisy or hemoptysis Cardiovascular: No chest pain or palpitations     Objective:   Physical Exam BP 112/74  Pulse 76  Temp 98.3 F (36.8 C) (Oral)  SpO2 99% GEN: mildly ill appearing and audible head congestion HENT: NCAT, mild sinus tenderness bilaterally, nares with thick discharge and turbinate swelling, oropharynx mild erythema and PND, no exudate Eyes: Vision grossly intact, no conjunctivitis Lungs: Clear to auscultation without rhonchi or wheeze, no increased work of breathing Cardiovascular: Regular rate and rhythm, no bilateral edema  Lab Results  Component Value Date   WBC 8.0 11/06/2011   HGB 13.4 11/06/2011   HCT 39.5 11/06/2011   PLT 273.0 11/06/2011   GLUCOSE 85 04/17/2012   CHOL 208* 11/06/2011   TRIG 226.0* 11/06/2011   HDL 50.60 11/06/2011   LDLDIRECT 122.7 11/06/2011   LDLCALC 126* 06/22/2008   ALT 20 11/06/2011   AST 18 11/06/2011   NA 140 04/17/2012   K 3.6 04/17/2012   CL 103 04/17/2012   CREATININE 0.9 04/17/2012   BUN 9 04/17/2012   CO2 29 04/17/2012   TSH 1.75 11/06/2011   HGBA1C 5.9 12/30/2007      Assessment & Plan:  acute sinusitis -persisting fever and green discharge despite amoxicillin and symptomatic care  Change antibiotic to Levaquin daily for 7 days Continue OTC symptomatic care: Tylenol or Advil, decongestants, antihistamine, hydration and rest -  Saline irrigation advised as needed

## 2012-10-16 NOTE — Patient Instructions (Signed)
It was good to see you today. Levaquin antibiotics -one daily x 7 days Take pseudoephedrine red pills every 4 hours as discussed and nasal saline irrigation 3 times daily Use Diflucan as discussed for yeast symptoms Your prescription(s) have been submitted to your pharmacy. Please take as directed and contact our office if you believe you are having problem(s) with the medication(s). Alternate between ibuprofen and tylenol for aches, pain and fever symptoms as discussed Hydrate, rest and call if worse or unimproved

## 2012-10-16 NOTE — Progress Notes (Signed)
  Subjective:    Patient ID: [name] [name], female    DOB: 1960/12/20, 52 y.o.   MRN: [id number]  CC: Ongoing sinusitis symptoms. Sinusitis This is a recurrent problem. The current episode started 1 to 4 weeks ago. The problem is unchanged. The maximum temperature recorded prior to her arrival was 102 - 102.9 F. The fever has been present for 1 to 2 days. She is experiencing no pain. Associated symptoms include congestion, coughing and sinus pressure. Pertinent negatives include no chills, shortness of breath or sore throat. Past treatments include oral decongestants, antibiotics and acetaminophen. The treatment provided mild relief.   Past Medical History  Diagnosis Date  . METABOLIC SYNDROME X   . OBESITY, TRUNCAL   . CONSTIPATION, CHRONIC   . KELOID   . Multiple sclerosis   . Allergic rhinitis due to other allergen   . HYPERTENSION   . HYPERLIPIDEMIA   . GERD   . Angioedema     ACEI    Review of Systems  Constitutional: Positive for fever. Negative for chills, appetite change and fatigue.  HENT: Positive for congestion, rhinorrhea, postnasal drip and sinus pressure. Negative for sore throat.   Respiratory: Positive for cough. Negative for chest tightness and shortness of breath.        Objective:   Physical Exam  Constitutional: She is oriented to person, place, and time. She appears well-developed and well-nourished. No distress.  HENT:  Right Ear: Tympanic membrane normal.  Left Ear: Tympanic membrane normal.  Nose: Mucosal edema and rhinorrhea present. Right sinus exhibits maxillary sinus tenderness and frontal sinus tenderness. Left sinus exhibits maxillary sinus tenderness and frontal sinus tenderness.  Mouth/Throat: Mucous membranes are normal. Posterior oropharyngeal erythema present.  Neck: Normal range of motion. Neck supple. No thyromegaly present.  Cardiovascular: Normal rate, regular rhythm and normal heart sounds.   Pulmonary/Chest: Effort normal and breath  sounds normal. No respiratory distress. She has no wheezes. She has no rales.  Lymphadenopathy:    She has no cervical adenopathy.  Neurological: She is alert and oriented to person, place, and time.  Skin: Skin is warm and dry.   BP 112/74  Pulse 76  Temp 98.3 F (36.8 C) (Oral)  SpO2 99% Lab Results  Component Value Date   WBC 8.0 11/06/2011   HGB 13.4 11/06/2011   HCT 39.5 11/06/2011   PLT 273.0 11/06/2011   GLUCOSE 85 04/17/2012   CHOL 208* 11/06/2011   TRIG 226.0* 11/06/2011   HDL 50.60 11/06/2011   LDLDIRECT 122.7 11/06/2011   LDLCALC 126* 06/22/2008   ALT 20 11/06/2011   AST 18 11/06/2011   NA 140 04/17/2012   K 3.6 04/17/2012   CL 103 04/17/2012   CREATININE 0.9 04/17/2012   BUN 9 04/17/2012   CO2 29 04/17/2012   TSH 1.75 11/06/2011   HGBA1C 5.9 12/30/2007      Assessment & Plan:  Unresolved sinusitis despite amoxicillin and decongestants.  Will continue decongestants through the day and cough syrup at bedtime.  Encouraged fluids and rest as well as nasal lavage 3x daily.  Will change antibiotic to levaquin 750mg  once daily x 7 days.  Advised to call if symptoms worsen or unimproved.    Trish Mancinelli A Kyliegh Jester PA-S  I have personally reviewed this case with PA student. I also personally examined this patient. I agree with history and findings as documented above. I reviewed, discussed and approve of the assessment and plan as listed above. Rene Paci, MD

## 2012-10-22 ENCOUNTER — Other Ambulatory Visit (INDEPENDENT_AMBULATORY_CARE_PROVIDER_SITE_OTHER): Payer: BC Managed Care – PPO

## 2012-10-22 DIAGNOSIS — Z0000 Encounter for general adult medical examination without abnormal findings: Secondary | ICD-10-CM

## 2012-10-22 LAB — CBC WITH DIFFERENTIAL/PLATELET
Basophils Absolute: 0 10*3/uL (ref 0.0–0.1)
Eosinophils Absolute: 0.2 10*3/uL (ref 0.0–0.7)
Lymphocytes Relative: 35.8 % (ref 12.0–46.0)
MCHC: 33.8 g/dL (ref 30.0–36.0)
MCV: 86 fl (ref 78.0–100.0)
Monocytes Absolute: 0.6 10*3/uL (ref 0.1–1.0)
Neutrophils Relative %: 53.4 % (ref 43.0–77.0)
RDW: 15.2 % — ABNORMAL HIGH (ref 11.5–14.6)

## 2012-10-22 LAB — BASIC METABOLIC PANEL
BUN: 15 mg/dL (ref 6–23)
CO2: 25 mEq/L (ref 19–32)
Calcium: 9.1 mg/dL (ref 8.4–10.5)
Chloride: 105 mEq/L (ref 96–112)
Creatinine, Ser: 0.8 mg/dL (ref 0.4–1.2)
Glucose, Bld: 94 mg/dL (ref 70–99)

## 2012-10-22 LAB — URINALYSIS, ROUTINE W REFLEX MICROSCOPIC
Bilirubin Urine: NEGATIVE
Hgb urine dipstick: NEGATIVE
Nitrite: NEGATIVE
Total Protein, Urine: NEGATIVE
Urine Glucose: NEGATIVE
Urobilinogen, UA: 0.2 (ref 0.0–1.0)

## 2012-10-22 LAB — LIPID PANEL
Cholesterol: 186 mg/dL (ref 0–200)
HDL: 44.9 mg/dL (ref >39.00)
Total CHOL/HDL Ratio: 4
Triglycerides: 187 mg/dL — ABNORMAL HIGH (ref 0.0–149.0)

## 2012-10-22 LAB — HEPATIC FUNCTION PANEL
AST: 20 U/L (ref 0–37)
Albumin: 3.8 g/dL (ref 3.5–5.2)
Alkaline Phosphatase: 63 U/L (ref 39–117)
Total Protein: 6.9 g/dL (ref 6.0–8.3)

## 2012-10-23 ENCOUNTER — Other Ambulatory Visit: Payer: Self-pay | Admitting: Obstetrics & Gynecology

## 2012-10-23 DIAGNOSIS — R922 Inconclusive mammogram: Secondary | ICD-10-CM

## 2012-11-02 ENCOUNTER — Other Ambulatory Visit: Payer: Self-pay

## 2012-11-07 ENCOUNTER — Ambulatory Visit (INDEPENDENT_AMBULATORY_CARE_PROVIDER_SITE_OTHER): Payer: BC Managed Care – PPO | Admitting: Internal Medicine

## 2012-11-07 ENCOUNTER — Encounter: Payer: Self-pay | Admitting: Internal Medicine

## 2012-11-07 VITALS — BP 128/82 | HR 82 | Temp 98.4°F | Ht 65.0 in | Wt 222.0 lb

## 2012-11-07 DIAGNOSIS — Z0000 Encounter for general adult medical examination without abnormal findings: Secondary | ICD-10-CM

## 2012-11-07 DIAGNOSIS — E65 Localized adiposity: Secondary | ICD-10-CM

## 2012-11-07 DIAGNOSIS — Z1211 Encounter for screening for malignant neoplasm of colon: Secondary | ICD-10-CM

## 2012-11-07 MED ORDER — ALPRAZOLAM 0.25 MG PO TABS: 0.2500 mg | ORAL_TABLET | Freq: Three times a day (TID) | ORAL | Status: DC | PRN

## 2012-11-07 NOTE — Assessment & Plan Note (Signed)
  Wt Readings from Last 3 Encounters:  11/07/12 222 lb (100.699 kg)  10/08/12 221 lb 12.8 oz (100.608 kg)  07/29/12 219 lb 4 oz (99.451 kg)  The patient is asked to make an attempt to improve diet and exercise patterns to aid in medical management of this problem.  Weight trend reviewed

## 2012-11-07 NOTE — Patient Instructions (Signed)
It was good to see you today. We have reviewed your prior records including labs and tests today Health Maintenance reviewed - all recommended immunizations and age-appropriate screenings are up-to-date or declined. we'll make referral to GI for colonoscopy screening. Our office will contact you regarding appointment(s) once made. Medications reviewed and updated, no changes at this time. Refill on medication(s) as discussed today. Work on lifestyle changes as discussed (low fat, low carb, increased protein diet; improved exercise efforts; weight loss) to control sugar, blood pressure and cholesterol levels and/or reduce risk of developing other medical problems. Look into [email or web address] or other type of food journal to assist you in this process. Please schedule followup in 6 months, call sooner if problems.  Health Maintenance, Females A healthy lifestyle and preventative care can promote health and wellness.  Maintain regular health, dental, and eye exams.  Eat a healthy diet. Foods like vegetables, fruits, whole grains, low-fat dairy products, and lean protein foods contain the nutrients you need without too many calories. Decrease your intake of foods high in solid fats, added sugars, and salt. Get information about a proper diet from your caregiver, if necessary.  Regular physical exercise is one of the most important things you can do for your health. Most adults should get at least 150 minutes of moderate-intensity exercise (any activity that increases your heart rate and causes you to sweat) each week. In addition, most adults need muscle-strengthening exercises on 2 or more days a week.   Maintain a healthy weight. The body mass index (BMI) is a screening tool to identify possible weight problems. It provides an estimate of body fat based on height and weight. Your caregiver can help determine your BMI, and can help you achieve or maintain a healthy weight. For adults 20 years and  older:  A BMI below 18.5 is considered underweight.  A BMI of 18.5 to 24.9 is normal.  A BMI of 25 to 29.9 is considered overweight.  A BMI of 30 and above is considered obese.  Maintain normal blood lipids and cholesterol by exercising and minimizing your intake of saturated fat. Eat a balanced diet with plenty of fruits and vegetables. Blood tests for lipids and cholesterol should begin at age 87 and be repeated every 5 years. If your lipid or cholesterol levels are high, you are over 50, or you are a high risk for heart disease, you may need your cholesterol levels checked more frequently.Ongoing high lipid and cholesterol levels should be treated with medicines if diet and exercise are not effective.  If you smoke, find out from your caregiver how to quit. If you do not use tobacco, do not start.  If you are pregnant, do not drink alcohol. If you are breastfeeding, be very cautious about drinking alcohol. If you are not pregnant and choose to drink alcohol, do not exceed 1 drink per day. One drink is considered to be 12 ounces (355 mL) of beer, 5 ounces (148 mL) of wine, or 1.5 ounces (44 mL) of liquor.  Avoid use of street drugs. Do not share needles with anyone. Ask for help if you need support or instructions about stopping the use of drugs.  High blood pressure causes heart disease and increases the risk of stroke. Blood pressure should be checked at least every 1 to 2 years. Ongoing high blood pressure should be treated with medicines, if weight loss and exercise are not effective.  If you are 79 to 51 years old, ask your caregiver if  you should take aspirin to prevent strokes.  Diabetes screening involves taking a blood sample to check your fasting blood sugar level. This should be done once every 3 years, after age 43, if you are within normal weight and without risk factors for diabetes. Testing should be considered at a younger age or be carried out more frequently if you are  overweight and have at least 1 risk factor for diabetes.  Breast cancer screening is essential preventative care for women. You should practice "breast self-awareness." This means understanding the normal appearance and feel of your breasts and may include breast self-examination. Any changes detected, no matter how small, should be reported to a caregiver. Women in their 20s and 30s should have a clinical breast exam (CBE) by a caregiver as part of a regular health exam every 1 to 3 years. After age 73, women should have a CBE every year. Starting at age 46, women should consider having a mammogram (breast X-ray) every year. Women who have a family history of breast cancer should talk to their caregiver about genetic screening. Women at a high risk of breast cancer should talk to their caregiver about having an MRI and a mammogram every year.  The Pap test is a screening test for cervical cancer. Women should have a Pap test starting at age 8. Between ages 57 and 43, Pap tests should be repeated every 2 years. Beginning at age 50, you should have a Pap test every 3 years as long as the past 3 Pap tests have been normal. If you had a hysterectomy for a problem that was not cancer or a condition that could lead to cancer, then you no longer need Pap tests. If you are between ages 52 and 88, and you have had normal Pap tests going back 10 years, you no longer need Pap tests. If you have had past treatment for cervical cancer or a condition that could lead to cancer, you need Pap tests and screening for cancer for at least 20 years after your treatment. If Pap tests have been discontinued, risk factors (such as a new sexual partner) need to be reassessed to determine if screening should be resumed. Some women have medical problems that increase the chance of getting cervical cancer. In these cases, your caregiver may recommend more frequent screening and Pap tests.  The human papillomavirus (HPV) test is an  additional test that may be used for cervical cancer screening. The HPV test looks for the virus that can cause the cell changes on the cervix. The cells collected during the Pap test can be tested for HPV. The HPV test could be used to screen women aged 64 years and older, and should be used in women of any age who have unclear Pap test results. After the age of 108, women should have HPV testing at the same frequency as a Pap test.  Colorectal cancer can be detected and often prevented. Most routine colorectal cancer screening begins at the age of 62 and continues through age 14. However, your caregiver may recommend screening at an earlier age if you have risk factors for colon cancer. On a yearly basis, your caregiver may provide home test kits to check for hidden blood in the stool. Use of a small camera at the end of a tube, to directly examine the colon (sigmoidoscopy or colonoscopy), can detect the earliest forms of colorectal cancer. Talk to your caregiver about this at age 1, when routine screening begins. Direct examination  of the colon should be repeated every 5 to 10 years through age 59, unless early forms of pre-cancerous polyps or small growths are found.  Hepatitis C blood testing is recommended for all people born from [birth year] through 1964 and any individual with known risks for hepatitis C.  Practice safe sex. Use condoms and avoid high-risk sexual practices to reduce the spread of sexually transmitted infections (STIs). Sexually active women aged 63 and younger should be checked for Chlamydia, which is a common sexually transmitted infection. Older women with new or multiple partners should also be tested for Chlamydia. Testing for other STIs is recommended if you are sexually active and at increased risk.  Osteoporosis is a disease in which the bones lose minerals and strength with aging. This can result in serious bone fractures. The risk of osteoporosis can be identified using a bone  density scan. Women ages 31 and over and women at risk for fractures or osteoporosis should discuss screening with their caregivers. Ask your caregiver whether you should be taking a calcium supplement or vitamin D to reduce the rate of osteoporosis.  Menopause can be associated with physical symptoms and risks. Hormone replacement therapy is available to decrease symptoms and risks. You should talk to your caregiver about whether hormone replacement therapy is right for you.  Use sunscreen with a sun protection factor (SPF) of 30 or greater. Apply sunscreen liberally and repeatedly throughout the day. You should seek shade when your shadow is shorter than you. Protect yourself by wearing long sleeves, pants, a wide-brimmed hat, and sunglasses year round, whenever you are outdoors.  Notify your caregiver of new moles or changes in moles, especially if there is a change in shape or color. Also notify your caregiver if a mole is larger than the size of a pencil eraser.  Stay current with your immunizations. Document Released: 03/20/2011 Document Revised: 11/27/2011 Document Reviewed: 03/20/2011 [facility] Patient Information 2013 [place], [place].

## 2012-11-07 NOTE — Progress Notes (Signed)
Subjective:    Patient ID: [name] [name], female    DOB: September 10, 1961, 52 y.o.   MRN: [id number]  HPI  patient is here today for annual physical. Patient feels well and has no complaints.  Also reviewed chronic medical issues:  Past Medical History  Diagnosis Date  . METABOLIC SYNDROME X   . OBESITY, TRUNCAL   . CONSTIPATION, CHRONIC   . KELOID   . Multiple sclerosis   . Allergic rhinitis due to other allergen   . HYPERTENSION   . HYPERLIPIDEMIA   . GERD   . Angioedema     ACEI    Family History  Problem Relation Age of Onset  . Coronary artery disease Mother   . Heart disease Mother   . Hypertension Other   . Hyperlipidemia Other   . Diabetes Other    History  Substance Use Topics  . Smoking status: Never Smoker   . Smokeless tobacco: Not on file     Comment: Single- brother staying with pt since mom's death  . Alcohol Use: No    Review of Systems Constitutional: Negative for fever or weight change.  Respiratory: Negative for cough and shortness of breath.   Cardiovascular: Negative for chest pain or palpitations.  Gastrointestinal: Negative for abdominal pain, no bowel changes.  Musculoskeletal: Negative for gait problem or joint swelling.  Skin: Negative for rash.  Neurological: Negative for dizziness or headache.  No other specific complaints in a complete review of systems (except as listed in HPI above).     Objective:   Physical Exam BP 128/82  Pulse 82  Temp(Src) 98.4 F (36.9 C) (Oral)  Ht 5\' 5"  (1.651 m)  Wt 222 lb (100.699 kg)  BMI 36.94 kg/m2  SpO2 95% Wt Readings from Last 3 Encounters:  11/07/12 222 lb (100.699 kg)  10/08/12 221 lb 12.8 oz (100.608 kg)  07/29/12 219 lb 4 oz (99.451 kg)   Constitutional: She is overweight, but appears well-developed and well-nourished. No distress.  HENT: Head: Normocephalic and atraumatic. Ears: B TMs ok, no erythema or effusion; Nose: Nose normal. Mouth/Throat: Oropharynx is clear and moist. No  oropharyngeal exudate.  Eyes: Conjunctivae and EOM are normal. Pupils are equal, round, and reactive to light. No scleral icterus.  Neck: Normal range of motion. Neck supple. No JVD present. No thyromegaly present.  Cardiovascular: Normal rate, regular rhythm and normal heart sounds.  No murmur heard. No BLE edema. Pulmonary/Chest: Effort normal and breath sounds normal. No respiratory distress. She has no wheezes.  Abdominal: Soft. Bowel sounds are normal. She exhibits no distension. There is no tenderness. no masses Musculoskeletal: Normal range of motion, no joint effusions. No gross deformities Neurological: She is alert and oriented to person, place, and time. No cranial nerve deficit. Coordination normal.  Skin: Skin is warm and dry. No rash noted. No erythema.  Psychiatric: She has a normal mood and affect. Her behavior is normal. Judgment and thought content normal.   Lab Results  Component Value Date   WBC 7.7 10/22/2012   HGB 11.8* 10/22/2012   HCT 35.0* 10/22/2012   PLT 310.0 10/22/2012   GLUCOSE 94 10/22/2012   CHOL 186 10/22/2012   TRIG 187.0* 10/22/2012   HDL 44.90 10/22/2012   LDLDIRECT 122.7 11/06/2011   LDLCALC 104* 10/22/2012   ALT 21 10/22/2012   AST 20 10/22/2012   NA 137 10/22/2012   K 3.4* 10/22/2012   CL 105 10/22/2012   CREATININE 0.8 10/22/2012   BUN 15 10/22/2012  CO2 25 10/22/2012   TSH 1.88 10/22/2012   HGBA1C 5.9 12/30/2007   10/2011 ECG reviewed in EMR    Assessment & Plan:   CPX/v70.0 - Patient has been counseled on age-appropriate routine health concerns for screening and prevention. These are reviewed and up-to-date. Immunizations are up-to-date or declined. Labs and prior ECG reviewed.

## 2012-11-20 ENCOUNTER — Ambulatory Visit
Admission: RE | Admit: 2012-11-20 | Discharge: 2012-11-20 | Disposition: A | Discharge status: Home / Self Care | Payer: BC Managed Care – PPO | Source: Ambulatory Visit | Attending: Obstetrics & Gynecology | Admitting: Obstetrics & Gynecology

## 2012-11-20 DIAGNOSIS — R922 Inconclusive mammogram: Secondary | ICD-10-CM

## 2013-01-15 ENCOUNTER — Ambulatory Visit (INDEPENDENT_AMBULATORY_CARE_PROVIDER_SITE_OTHER): Payer: BC Managed Care – PPO | Admitting: Internal Medicine

## 2013-01-15 ENCOUNTER — Encounter: Payer: Self-pay | Admitting: Internal Medicine

## 2013-01-15 VITALS — BP 120/80 | HR 74 | Temp 98.5°F | Wt 221.4 lb

## 2013-01-15 DIAGNOSIS — R109 Unspecified abdominal pain: Secondary | ICD-10-CM

## 2013-01-15 DIAGNOSIS — E65 Localized adiposity: Secondary | ICD-10-CM

## 2013-01-15 DIAGNOSIS — T148XXA Other injury of unspecified body region, initial encounter: Secondary | ICD-10-CM

## 2013-01-15 NOTE — Assessment & Plan Note (Signed)
  Wt Readings from Last 3 Encounters:  01/15/13 221 lb 6.4 oz (100.426 kg)  11/07/12 222 lb (100.699 kg)  10/08/12 221 lb 12.8 oz (100.608 kg)  The patient is asked to make an attempt to improve diet and exercise patterns to aid in medical management of this problem.  Weight trend reviewed  patient will try gluten free x 30 days and keep food journal - will report back next OV

## 2013-01-15 NOTE — Patient Instructions (Signed)
It was good to see you today. I suspect your side pain is because of muscle stain - call if recurrent symptoms Try gluten free diet for 30 days starting tomorrow - keep food journal and we will review this next visit Enjoy your cruise!

## 2013-01-15 NOTE — Progress Notes (Signed)
  Subjective:    Patient ID: [name] [name], female    DOB: [birth year], 52 y.o.   MRN: [id number]  HPI  Complains of left flank and abdominal pain Onset 3 days ago Lasted 48 hours, then spontaneous resolution 0/10 pain at this time No history of same  Also wishes to discuss diet options for weight loss  Past Medical History  Diagnosis Date  . METABOLIC SYNDROME X   . OBESITY, TRUNCAL   . CONSTIPATION, CHRONIC   . KELOID   . Multiple sclerosis   . Allergic rhinitis due to other allergen   . HYPERTENSION   . HYPERLIPIDEMIA   . GERD   . Angioedema     ACEI    Review of Systems  Constitutional: Negative for fever and fatigue.  Respiratory: Negative for cough and shortness of breath.   Gastrointestinal: Positive for constipation (chronic). Negative for nausea, vomiting, diarrhea and abdominal distention.  Genitourinary: Negative for frequency and hematuria.  Musculoskeletal: Negative for back pain and gait problem.       Objective:   Physical Exam BP 120/80  Pulse 74  Temp(Src) 98.5 F (36.9 C) (Oral)  Wt 221 lb 6.4 oz (100.426 kg)  BMI 36.84 kg/m2  SpO2 98% Wt Readings from Last 3 Encounters:  01/15/13 221 lb 6.4 oz (100.426 kg)  11/07/12 222 lb (100.699 kg)  10/08/12 221 lb 12.8 oz (100.608 kg)   Constitutional: She is overweight but appears well-developed and well-nourished. No distress.   Neck: Normal range of motion. Neck supple. No JVD present. No thyromegaly present.  Cardiovascular: Normal rate, regular rhythm and normal heart sounds.  No murmur heard. No BLE edema. Pulmonary/Chest: Effort normal and breath sounds normal. No respiratory distress. She has no wheezes.  Abdominal: Soft. Bowel sounds are normal. She exhibits no distension. There is no tenderness. no masses Musculoskeletal: Normal range of motion, no joint effusions. No gross deformities Skin: Skin is warm and dry. No rash Or bruise noted. No erythema.  Psychiatric: She has a normal mood and  affect. Her behavior is normal. Judgment and thought content normal.   Lab Results  Component Value Date   WBC 7.7 10/22/2012   HGB 11.8* 10/22/2012   HCT 35.0* 10/22/2012   PLT 310.0 10/22/2012   GLUCOSE 94 10/22/2012   CHOL 186 10/22/2012   TRIG 187.0* 10/22/2012   HDL 44.90 10/22/2012   LDLDIRECT 122.7 11/06/2011   LDLCALC 104* 10/22/2012   ALT 21 10/22/2012   AST 20 10/22/2012   NA 137 10/22/2012   K 3.4* 10/22/2012   CL 105 10/22/2012   CREATININE 0.8 10/22/2012   BUN 15 10/22/2012   CO2 25 10/22/2012   TSH 1.88 10/22/2012   HGBA1C 5.9 12/30/2007        Assessment & Plan:   Left flank pain x48 hours, resolved yesterday - exam and history benign. Suspect muscular strain versus mild incidental trauma ( same height as desk at work) - patient will call if recurrent symptoms for further evaluation as needed  See problem list. Medications and labs reviewed today.

## 2013-01-22 ENCOUNTER — Telehealth: Payer: Self-pay | Admitting: *Deleted

## 2013-01-22 MED ORDER — SCOPOLAMINE 1 MG/3DAYS TD PT72: 1.0000 | MEDICATED_PATCH | TRANSDERMAL | Status: DC

## 2013-01-22 NOTE — Telephone Encounter (Signed)
done

## 2013-01-22 NOTE — Telephone Encounter (Signed)
Requesting md to rx patch for motion sickness. Leaving going on a cruise on Sunday...[name] [name]

## 2013-01-22 NOTE — Telephone Encounter (Signed)
Pt notified rx sent to pharmacy...lmb

## 2013-02-03 ENCOUNTER — Ambulatory Visit (INDEPENDENT_AMBULATORY_CARE_PROVIDER_SITE_OTHER): Payer: BC Managed Care – PPO | Admitting: Internal Medicine

## 2013-02-03 ENCOUNTER — Encounter: Payer: Self-pay | Admitting: *Deleted

## 2013-02-03 ENCOUNTER — Encounter: Payer: Self-pay | Admitting: Internal Medicine

## 2013-02-03 VITALS — BP 110/72 | HR 80 | Temp 98.2°F | Wt 217.8 lb

## 2013-02-03 DIAGNOSIS — G35 Multiple sclerosis: Secondary | ICD-10-CM

## 2013-02-03 DIAGNOSIS — I10 Essential (primary) hypertension: Secondary | ICD-10-CM

## 2013-02-03 DIAGNOSIS — R197 Diarrhea, unspecified: Secondary | ICD-10-CM

## 2013-02-03 MED ORDER — CIPROFLOXACIN HCL 500 MG PO TABS: 500.0000 mg | ORAL_TABLET | Freq: Two times a day (BID) | ORAL | Status: DC

## 2013-02-03 MED ORDER — DIPHENOXYLATE-ATROPINE 2.5-0.025 MG PO TABS: 1.0000 | ORAL_TABLET | Freq: Four times a day (QID) | ORAL | Status: DC | PRN

## 2013-02-03 MED ORDER — FLUCONAZOLE 150 MG PO TABS: 150.0000 mg | ORAL_TABLET | Freq: Once | ORAL | Status: DC

## 2013-02-03 NOTE — Progress Notes (Signed)
  Subjective:    Patient ID: [name] [name], female    DOB: 1961-04-29, 52 y.o.   MRN: [id number]  Diarrhea  This is a new problem. The current episode started in the past 7 days. The problem occurs 2 to 4 times per day. The problem has been waxing and waning. The stool consistency is described as watery. Associated symptoms include abdominal pain, bloating, chills and myalgias. Pertinent negatives include no arthralgias, coughing, fever, increased  flatus, vomiting or weight loss. Nothing aggravates the symptoms. Risk factors include travel to endemic area. She has tried bismuth subsalicylate for the symptoms. The treatment provided mild relief. There is no history of inflammatory bowel disease, malabsorption, a recent abdominal surgery or short gut syndrome.    Past Medical History  Diagnosis Date  . METABOLIC SYNDROME X   . OBESITY, TRUNCAL   . CONSTIPATION, CHRONIC   . KELOID   . Multiple sclerosis   . Allergic rhinitis due to other allergen   . HYPERTENSION   . HYPERLIPIDEMIA   . GERD   . Angioedema     ACEI    Review of Systems  Constitutional: Positive for chills. Negative for fever and weight loss.  Respiratory: Negative for cough.   Gastrointestinal: Positive for abdominal pain, diarrhea and bloating. Negative for vomiting and flatus.  Musculoskeletal: Positive for myalgias. Negative for arthralgias.       Objective:   Physical Exam BP 110/72  Pulse 80  Temp(Src) 98.2 F (36.8 C) (Oral)  Wt 217 lb 12.8 oz (98.793 kg)  BMI 36.24 kg/m2  SpO2 98% Wt Readings from Last 3 Encounters:  02/03/13 217 lb 12.8 oz (98.793 kg)  01/15/13 221 lb 6.4 oz (100.426 kg)  11/07/12 222 lb (100.699 kg)   Constitutional: She appears well-developed and well-nourished. No distress.  Neck: Normal range of motion. Neck supple. No JVD present. No thyromegaly present.  Cardiovascular: Normal rate, regular rhythm and normal heart sounds.  No murmur heard. No BLE edema. Pulmonary/Chest:  Effort normal and breath sounds normal. No respiratory distress. She has no wheezes.  Abdominal: Soft. Bowel sounds are normal. She exhibits no distension. There is mild left side tenderness. no masses, no r/g Skin: Skin is warm and dry. No rash noted. No erythema.  Psychiatric: She has a normal mood and affect. Her behavior is normal. Judgment and thought content normal.   Lab Results  Component Value Date   WBC 7.7 10/22/2012   HGB 11.8* 10/22/2012   HCT 35.0* 10/22/2012   PLT 310.0 10/22/2012   GLUCOSE 94 10/22/2012   CHOL 186 10/22/2012   TRIG 187.0* 10/22/2012   HDL 44.90 10/22/2012   LDLDIRECT 122.7 11/06/2011   LDLCALC 104* 10/22/2012   ALT 21 10/22/2012   AST 20 10/22/2012   NA 137 10/22/2012   K 3.4* 10/22/2012   CL 105 10/22/2012   CREATININE 0.8 10/22/2012   BUN 15 10/22/2012   CO2 25 10/22/2012   TSH 1.88 10/22/2012   HGBA1C 5.9 12/30/2007        Assessment & Plan:   Diarrhea - return from cruise last week tx empiric with antibiotics and lomotil BRAT diet Call if worse or unimproved

## 2013-02-03 NOTE — Patient Instructions (Signed)
It was good to see you today. Cipro antibiotic twice a day for one week and Lomotil as needed for loose stool/diarrhea Your prescription(s) have been submitted to your pharmacy. Please take as directed and contact our office if you believe you are having problem(s) with the medication(s). Avoid dairy and no fried food/fats until GI symptoms have improved - see BRAT diet instructions below Work excuse note for today provided as requested Call if symptoms worse or unimproved Diarrhea Diarrhea is watery poop (stool). The most common cause of diarrhea is a germ. Other causes include:  Food poisoning.  A reaction to medicine. HOME CARE   Drink clear fluids. This can stop you from losing too much body fluid (dehydration).  Drink enough fluids to keep your pee (urine) clear or pale yellow.  Avoid solid foods and dairy products until you start to feel better. Then start eating bland foods, such as:  Bananas.  Rice.  Crackers.  Applesauce.  Dry toast.  Avoid spicy foods, caffeine, and alcohol.  Your doctor may give medicine to help with cramps and watery poop. Take this as told. Avoid these medicines if you have a fever or blood in your poop.  Take your medicine as told. Finish them even if you start to feel better. GET HELP RIGHT AWAY IF:   The watery poop lasts longer than 3 days.  You have a fever.  Your baby is older than 3 months with a rectal temperature of 100.5 F (38.1 C) or higher for more than 1 day.  There is blood in your poop.  You start to throw up (vomit).  You lose too much fluid. MAKE SURE YOU:   Understand these instructions.  Will watch your condition.  Will get help right away if you are not doing well or get worse. Document Released: 02/21/2008 Document Revised: 11/27/2011 Document Reviewed: 02/21/2008 [facility] Patient Information 2013 [place], [place]. B.R.A.T. Diet Your doctor has recommended the B.R.A.T. diet for you or your child until the  condition improves. This is often used to help control diarrhea and vomiting symptoms. If you or your child can tolerate clear liquids, you may have:  Bananas.   Rice.   Applesauce.   Toast (and other simple starches such as crackers, potatoes, noodles).  Be sure to avoid dairy products, meats, and fatty foods until symptoms are better. Fruit juices such as apple, grape, and prune juice can make diarrhea worse. Avoid these. Continue this diet for 2 days or as instructed by your caregiver. Document Released: 09/04/2005 Document Revised: 08/24/2011 Document Reviewed: 02/21/2007 [facility] Patient Information 2012 [place], [place].

## 2013-02-03 NOTE — Assessment & Plan Note (Signed)
BP Readings from Last 3 Encounters:  02/03/13 110/72  01/15/13 120/80  11/07/12 128/82  exacerbated by stress - currently well controlled The current medical regimen is effective;  continue present plan and medications.

## 2013-02-03 NOTE — Assessment & Plan Note (Signed)
Stable without flares for many years until fall 2013 -then resumed medical maintenance treatment No acute flare despite GI symptoms -  mild immunocompromise status due to same, increased role for empiric antibiotics as rx'd at this time

## 2013-03-14 ENCOUNTER — Ambulatory Visit: Payer: BC Managed Care – PPO | Admitting: Internal Medicine

## 2013-03-14 ENCOUNTER — Ambulatory Visit (INDEPENDENT_AMBULATORY_CARE_PROVIDER_SITE_OTHER): Payer: BC Managed Care – PPO | Admitting: Internal Medicine

## 2013-03-14 ENCOUNTER — Encounter: Payer: Self-pay | Admitting: Internal Medicine

## 2013-03-14 VITALS — BP 152/90 | HR 79 | Temp 97.9°F

## 2013-03-14 DIAGNOSIS — R141 Gas pain: Secondary | ICD-10-CM

## 2013-03-14 DIAGNOSIS — IMO0001 Reserved for inherently not codable concepts without codable children: Secondary | ICD-10-CM

## 2013-03-14 DIAGNOSIS — S335XXA Sprain of ligaments of lumbar spine, initial encounter: Secondary | ICD-10-CM

## 2013-03-14 DIAGNOSIS — S39012A Strain of muscle, fascia and tendon of lower back, initial encounter: Secondary | ICD-10-CM

## 2013-03-14 DIAGNOSIS — R140 Abdominal distension (gaseous): Secondary | ICD-10-CM

## 2013-03-14 MED ORDER — DICLOFENAC SODIUM 75 MG PO TBEC: 75.0000 mg | DELAYED_RELEASE_TABLET | Freq: Two times a day (BID) | ORAL | Status: DC

## 2013-03-14 NOTE — Progress Notes (Signed)
Subjective:    Patient ID: [name] [name], female    DOB: [birth year], 52 y.o.   MRN: [id number]  HPI  Complains of left flank and abdominal pain Intermittent symptoms over past week Not associated with nausea, vomiting, bowel changes - chronic constipation improving with flaxseed use Symptoms improved with Gas-X, but concerned about becoming dependent on same No fever, no unintended weight loss  Also complains of low back discomfort Symptoms mild 2 at 10. Patient's precipitated by over use  During weekend  Past Medical History  Diagnosis Date  . METABOLIC SYNDROME X   . OBESITY, TRUNCAL   . CONSTIPATION, CHRONIC   . KELOID   . Multiple sclerosis   . Allergic rhinitis due to other allergen   . HYPERTENSION   . HYPERLIPIDEMIA   . GERD   . Angioedema     ACEI    Review of Systems  Constitutional: Negative for fever and fatigue.  Respiratory: Negative for cough and shortness of breath.   Gastrointestinal: Positive for constipation (chronic). Negative for nausea, vomiting, diarrhea and abdominal distention.  Genitourinary: Positive for flank pain. Negative for frequency and hematuria.  Musculoskeletal: Negative for back pain and gait problem.       Objective:   Physical Exam  BP 152/90  Pulse 79  Temp(Src) 97.9 F (36.6 C) (Oral)  SpO2 98% Wt Readings from Last 3 Encounters:  02/03/13 217 lb 12.8 oz (98.793 kg)  01/15/13 221 lb 6.4 oz (100.426 kg)  11/07/12 222 lb (100.699 kg)   Constitutional: She is overweight but appears well-developed and well-nourished. No distress.   Neck: Normal range of motion. Neck supple. No JVD present. No thyromegaly present.  Cardiovascular: Normal rate, regular rhythm and normal heart sounds.  No murmur heard. No BLE edema. Pulmonary/Chest: Effort normal and breath sounds normal. No respiratory distress. She has no wheezes.  Abdominal: Soft. Bowel sounds are normal. She exhibits no distension. There is no tenderness. no  masses Musculoskeletal: Back: full range of motion of thoracic and lumbar spine. Non tender to palpation. Negative straight leg raise. DTR's are symmetrically intact. Sensation intact in all dermatomes of the lower extremities. Full strength to manual muscle testing. patient is able to heel toe walk without difficulty and ambulates with antalgic gait. Skin: Skin is warm and dry. No rash Or bruise noted. No erythema.  Psychiatric: She has a normal mood and affect. Her behavior is normal. Judgment and thought content normal.   Lab Results  Component Value Date   WBC 7.7 10/22/2012   HGB 11.8* 10/22/2012   HCT 35.0* 10/22/2012   PLT 310.0 10/22/2012   GLUCOSE 94 10/22/2012   CHOL 186 10/22/2012   TRIG 187.0* 10/22/2012   HDL 44.90 10/22/2012   LDLDIRECT 122.7 11/06/2011   LDLCALC 104* 10/22/2012   ALT 21 10/22/2012   AST 20 10/22/2012   NA 137 10/22/2012   K 3.4* 10/22/2012   CL 105 10/22/2012   CREATININE 0.8 10/22/2012   BUN 15 10/22/2012   CO2 25 10/22/2012   TSH 1.88 10/22/2012   HGBA1C 5.9 12/30/2007        Assessment & Plan:   See problem list. Medications and labs reviewed today.  Abdominal bloating and gas - reassurance provided. Symptoms improved with Gas-X and should continue same as needed. Also advised on diet changes and importance of regular aerobic exercise to control same  Low back strain, initial encounter. Musculoskeletal and neuro exam today benign. Treat with prescription NSAIDs twice a day x1 week,  then as needed. Also education and demonstration of home back exercises provided. Patient agrees to call if symptoms unimproved in next 2 weeks, sooner if worse

## 2013-03-14 NOTE — Patient Instructions (Addendum)
It was good to see you today. Use GasX change probiotic to [facility] colon health and consider reducing greens and fruit/veggie if causing excess gas Use voltaren 2x/day for back pain - Your prescription(s) have been submitted to your pharmacy. Please take as directed and contact our office if you believe you are having problem(s) with the medication(s). Call if worse or unimproved  Back Exercises Back exercises help treat and prevent back injuries. The goal is to increase your strength in your belly (abdominal) and back muscles. These exercises can also help with flexibility. Start these exercises when told by your doctor. HOME CARE Back exercises include: Pelvic Tilt.  Lie on your back with your knees bent. Tilt your pelvis until the lower part of your back is against the floor. Hold this position 5 to 10 sec. Repeat this exercise 5 to 10 times. Knee to Chest.  Pull 1 knee up against your chest and hold for 20 to 30 seconds. Repeat this with the other knee. This may be done with the other leg straight or bent, whichever feels better. Then, pull both knees up against your chest. Sit-Ups or Curl-Ups.  Bend your knees 90 degrees. Start with tilting your pelvis, and do a partial, slow sit-up. Only lift your upper half 30 to 45 degrees off the floor. Take at least 2 to 3 seonds for each sit-up. Do not do sit-ups with your knees out straight. If partial sit-ups are difficult, simply do the above but with only tightening your belly (abdominal) muscles and holding it as told. Hip-Lift.  Lie on your back with your knees flexed 90 degrees. Push down with your feet and shoulders as you raise your hips 2 inches off the floor. Hold for 10 seconds, repeat 5 to 10 times. Back Arches.  Lie on your stomach. Prop yourself up on bent elbows. Slowly press on your hands, causing an arch in your low back. Repeat 3 to 5 times. Shoulder-Lifts.  Lie face down with arms beside your body. Keep hips and belly pressed  to floor as you slowly lift your head and shoulders off the floor. Do not overdo your exercises. Be careful in the beginning. Exercises may cause you some mild back discomfort. If the pain lasts for more than 15 minutes, stop the exercises until you see your doctor. Improvement with exercise for back problems is slow.  Document Released: 10/07/2010 Document Revised: 11/27/2011 Document Reviewed: 07/06/2011 [facility] Patient Information 2014 [place], [place]. Bloating Bloating is the feeling of fullness in your belly. You may feel as though your pants are too tight. Often the cause of bloating is overeating, retaining fluids, or having gas in your bowel. It is also caused by swallowing air and eating foods that cause gas. Irritable bowel syndrome is one of the most common causes of bloating. Constipation is also a common cause. Sometimes more serious problems can cause bloating. SYMPTOMS  Usually there is a feeling of fullness, as though your abdomen is bulged out. There may be mild discomfort.  DIAGNOSIS  Usually no particular testing is necessary for most bloating. If the condition persists and seems to become worse, your caregiver may do additional testing.  TREATMENT   There is no direct treatment for bloating.  Do not put gas into the bowel. Avoid chewing gum and sucking on candy. These tend to make you swallow air. Swallowing air can also be a nervous habit. Try to avoid this.  Avoiding high residue diets will help. Eat foods with soluble fibers (  examples include root vegetables, apples, or barley) and substitute dairy products with soy and rice products. This helps irritable bowel syndrome.  If constipation is the cause, then a high residue diet with more fiber will help.  Avoid carbonated beverages.  Over-the-counter preparations are available that help reduce gas. Your pharmacist can help you with this. SEEK MEDICAL CARE IF:   Bloating continues and seems to be getting worse.  You  notice a weight gain.  You have a weight loss but the bloating is getting worse.  You have changes in your bowel habits or develop nausea or vomiting. SEEK IMMEDIATE MEDICAL CARE IF:   You develop shortness of breath or swelling in your legs.  You have an increase in abdominal pain or develop chest pain. Document Released: 07/05/2006 Document Revised: 11/27/2011 Document Reviewed: 08/23/2007 [facility] Patient Information 2014 [place], [place].

## 2013-03-27 ENCOUNTER — Telehealth: Payer: Self-pay | Admitting: Neurology

## 2013-03-27 NOTE — Telephone Encounter (Signed)
Go to ER.... I tried both numbers, nobody answered and there seems no VM set up.  This is not as likely MS , sounds rather like a TIA - She needs to be seen at the hospital , best [place] and undergo neuro inpatient work up.   if they decide she had a TIA or stroke, will need to follow up with Dr Pearlean Brownie, and in case of MS with me. I strongly recommend to all patients with paroxysmal vision loss to go and get evaluated right away.

## 2013-03-27 NOTE — Telephone Encounter (Signed)
Pt experienced 2 episodes of partial loss of visual field and pain in the rear of her left eye: Sunday (03/23/13) and today after she got to work.  Each episode lasted approximately 3 hours.  Please advise.

## 2013-03-28 ENCOUNTER — Encounter: Payer: Self-pay | Admitting: Internal Medicine

## 2013-03-28 ENCOUNTER — Ambulatory Visit (INDEPENDENT_AMBULATORY_CARE_PROVIDER_SITE_OTHER): Payer: BC Managed Care – PPO | Admitting: Internal Medicine

## 2013-03-28 VITALS — BP 130/80 | HR 100 | Temp 98.0°F | Resp 16 | Wt 219.0 lb

## 2013-03-28 DIAGNOSIS — G35 Multiple sclerosis: Secondary | ICD-10-CM

## 2013-03-28 DIAGNOSIS — G43809 Other migraine, not intractable, without status migrainosus: Secondary | ICD-10-CM

## 2013-03-28 DIAGNOSIS — G43109 Migraine with aura, not intractable, without status migrainosus: Secondary | ICD-10-CM

## 2013-03-28 MED ORDER — BUTALBITAL-APAP-CAFFEINE 50-325-40 MG PO TABS: 1.0000 | ORAL_TABLET | Freq: Two times a day (BID) | ORAL | Status: DC | PRN

## 2013-03-28 NOTE — Progress Notes (Signed)
Subjective:    Patient ID: [name] [name], female    DOB: 08-12-1961, 52 y.o.   MRN: [id number]  HPI  complains of problems with L eye "white bright lights" on lateral side of left eye field 3 days ago - lasted 10 min, then resolved Then 2 days ago "zig zag lights" in same field associated with L eye pain -  Pain unaffected by movement of eye -denies light sensitivity Tearing but no blurring from L eye No headache or weakness, numbness  Past Medical History  Diagnosis Date  . METABOLIC SYNDROME X   . OBESITY, TRUNCAL   . CONSTIPATION, CHRONIC   . KELOID   . Multiple sclerosis   . Allergic rhinitis due to other allergen   . HYPERTENSION   . HYPERLIPIDEMIA   . GERD   . Angioedema     ACEI    Review of Systems  Constitutional: Negative for fever, fatigue and unexpected weight change.  Respiratory: Negative for cough and shortness of breath.   Cardiovascular: Negative for chest pain, palpitations and leg swelling.  Neurological: Negative for dizziness, tremors, seizures, facial asymmetry, speech difficulty, weakness, light-headedness and headaches.       Objective:   Physical Exam BP 130/80  Pulse 100  Temp(Src) 98 F (36.7 C) (Oral)  Resp 16  Wt 219 lb (99.338 kg)  BMI 36.44 kg/m2  SpO2 97% Wt Readings from Last 3 Encounters:  03/28/13 219 lb (99.338 kg)  02/03/13 217 lb 12.8 oz (98.793 kg)  01/15/13 221 lb 6.4 oz (100.426 kg)   Constitutional: She is overweight, but appears well-developed and well-nourished. No distress.  HENT: Head: Normocephalic and atraumatic. No sinus tenderness. Ears: B TMs ok, no erythema or effusion; Nose: Nose normal. Mouth/Throat: Oropharynx is clear and moist. No oropharyngeal exudate.  Eyes: Conjunctivae and EOM are normal. Pupils are equal, round, and reactive to light. No scleral icterus. No fundoscopic abnormality Neck: Normal range of motion. Neck supple. No JVD or LAD present. No thyromegaly present.  Cardiovascular: Normal  rate, regular rhythm and normal heart sounds.  No murmur heard. No BLE edema. Pulmonary/Chest: Effort normal and breath sounds normal. No respiratory distress. She has no wheezes.  Neurological: She is alert and oriented to person, place, and time. No cranial nerve deficit. Coordination, balance, strength, speech and gait are normal.  Skin: Skin is warm and dry. No rash noted. No erythema.  Psychiatric: She has a normal mood and affect. Her behavior is normal. Judgment and thought content normal.   Lab Results  Component Value Date   WBC 7.7 10/22/2012   HGB 11.8* 10/22/2012   HCT 35.0* 10/22/2012   PLT 310.0 10/22/2012   GLUCOSE 94 10/22/2012   CHOL 186 10/22/2012   TRIG 187.0* 10/22/2012   HDL 44.90 10/22/2012   LDLDIRECT 122.7 11/06/2011   LDLCALC 104* 10/22/2012   ALT 21 10/22/2012   AST 20 10/22/2012   NA 137 10/22/2012   K 3.4* 10/22/2012   CL 105 10/22/2012   CREATININE 0.8 10/22/2012   BUN 15 10/22/2012   CO2 25 10/22/2012   TSH 1.88 10/22/2012   HGBA1C 5.9 12/30/2007       Assessment & Plan:   Monocular paroxysmal vision change (not vision loss) and eye pain x 5 days - both resolved at this time Suspect occular migraine based on hx and exam Also recommended to arrange for MRI brain - r/o TIA/CVA or MS flare -  However, pt adamantly refuses MRI  Tx empiric with Esg[facility] -  Education on occular migraine provided Pt agrees to go to ER if loss of vision, weakness, unilateral symptoms or other problems/recurrence

## 2013-03-28 NOTE — Patient Instructions (Signed)
It was good to see you today. if your symptoms continue to worsen (vision LOSS, pain, weakness, one sided symptoms of anything), you should go to the emergency room for further evaluation and treatment. Treat for ocular migraine as discussed with [facility] as needed Your prescription(s) have been submitted to your pharmacy. Please take as directed and contact our office if you believe you are having problem(s) with the medication(s).  Migraine Headache A migraine headache is an intense, throbbing pain on one or both sides of your head. A migraine can last for 30 minutes to several hours. CAUSES  The exact cause of a migraine headache is not always known. However, a migraine may be caused when nerves in the brain become irritated and release chemicals that cause inflammation. This causes pain. SYMPTOMS  Pain on one or both sides of your head.  Pulsating or throbbing pain.  Severe pain that prevents daily activities.  Pain that is aggravated by any physical activity.  Nausea, vomiting, or both.  Dizziness.  Pain with exposure to bright lights, loud noises, or activity.  General sensitivity to bright lights, loud noises, or smells. Before you get a migraine, you may get warning signs that a migraine is coming (aura). An aura may include:  Seeing flashing lights.  Seeing bright spots, halos, or zig-zag lines.  Having tunnel vision or blurred vision.  Having feelings of numbness or tingling.  Having trouble talking.  Having muscle weakness. MIGRAINE TRIGGERS  Alcohol.  Smoking.  Stress.  Menstruation.  Aged cheeses.  Foods or drinks that contain nitrates, glutamate, aspartame, or tyramine.  Lack of sleep.  Chocolate.  Caffeine.  Hunger.  Physical exertion.  Fatigue.  Medicines used to treat chest pain (nitroglycerine), birth control pills, estrogen, and some blood pressure medicines. DIAGNOSIS  A migraine headache is often diagnosed based  on:  Symptoms.  Physical examination.  A CT scan or MRI of your head. TREATMENT Medicines may be given for pain and nausea. Medicines can also be given to help prevent recurrent migraines.  HOME CARE INSTRUCTIONS  Only take over-the-counter or prescription medicines for pain or discomfort as directed by your caregiver. The use of long-term narcotics is not recommended.  Lie down in a dark, quiet room when you have a migraine.  Keep a journal to find out what may trigger your migraine headaches. For example, write down:  What you eat and drink.  How much sleep you get.  Any change to your diet or medicines.  Limit alcohol consumption.  Quit smoking if you smoke.  Get 7 to 9 hours of sleep, or as recommended by your caregiver.  Limit stress.  Keep lights dim if bright lights bother you and make your migraines worse. SEEK IMMEDIATE MEDICAL CARE IF:   Your migraine becomes severe.  You have a fever.  You have a stiff neck.  You have vision loss.  You have muscular weakness or loss of muscle control.  You start losing your balance or have trouble walking.  You feel faint or pass out.  You have severe symptoms that are different from your first symptoms. MAKE SURE YOU:   Understand these instructions.  Will watch your condition.  Will get help right away if you are not doing well or get worse. Document Released: 09/04/2005 Document Revised: 11/27/2011 Document Reviewed: 08/25/2011 [facility] Patient Information 2014 [place], [place].

## 2013-03-28 NOTE — Telephone Encounter (Signed)
Spoke with Ms Cregan and relayed Dr Dohmeier's concern's and advice.  Ms Ginzburg understood and stated that she will go to the hospital today.

## 2013-03-29 NOTE — Assessment & Plan Note (Signed)
Stable without flares for many years until fall 2013 -then resumed medical maintenance treatment ?occular flare - see above - further eval by neuro as needed if persisting or recurring symptoms

## 2013-04-18 ENCOUNTER — Other Ambulatory Visit: Payer: Self-pay

## 2013-04-18 DIAGNOSIS — I10 Essential (primary) hypertension: Secondary | ICD-10-CM

## 2013-04-18 MED ORDER — LOSARTAN POTASSIUM 50 MG PO TABS: 50.0000 mg | ORAL_TABLET | Freq: Two times a day (BID) | ORAL | Status: DC

## 2013-05-05 ENCOUNTER — Ambulatory Visit (INDEPENDENT_AMBULATORY_CARE_PROVIDER_SITE_OTHER): Payer: BC Managed Care – PPO | Admitting: Internal Medicine

## 2013-05-05 ENCOUNTER — Encounter: Payer: Self-pay | Admitting: Internal Medicine

## 2013-05-05 VITALS — BP 112/80 | HR 74 | Temp 98.7°F

## 2013-05-05 DIAGNOSIS — F419 Anxiety disorder, unspecified: Secondary | ICD-10-CM

## 2013-05-05 DIAGNOSIS — I10 Essential (primary) hypertension: Secondary | ICD-10-CM

## 2013-05-05 DIAGNOSIS — F411 Generalized anxiety disorder: Secondary | ICD-10-CM

## 2013-05-05 DIAGNOSIS — E65 Localized adiposity: Secondary | ICD-10-CM

## 2013-05-05 MED ORDER — SERTRALINE HCL 25 MG PO TABS: 25.0000 mg | ORAL_TABLET | Freq: Every day | ORAL | Status: DC

## 2013-05-05 MED ORDER — ALPRAZOLAM 0.25 MG PO TABS: 0.2500 mg | ORAL_TABLET | Freq: Three times a day (TID) | ORAL | Status: DC | PRN

## 2013-05-05 NOTE — Progress Notes (Signed)
  Subjective:    Patient ID: [name] [name], female    DOB: 1961/04/07, 52 y.o.   MRN: [id number]  HPI  See CC  Past Medical History  Diagnosis Date  . METABOLIC SYNDROME X   . OBESITY, TRUNCAL   . CONSTIPATION, CHRONIC   . KELOID   . Multiple sclerosis   . Allergic rhinitis due to other allergen   . HYPERTENSION   . HYPERLIPIDEMIA   . GERD   . Angioedema     ACEI    Review of Systems  Constitutional: Negative for fever, fatigue and unexpected weight change.  Respiratory: Positive for shortness of breath. Negative for apnea, cough, choking, chest tightness, wheezing and stridor.   Cardiovascular: Negative for chest pain, palpitations and leg swelling.  Psychiatric/Behavioral: Negative for suicidal ideas, sleep disturbance, self-injury and dysphoric mood. The patient is nervous/anxious.        Objective:   Physical Exam BP 112/80  Pulse 74  Temp(Src) 98.7 F (37.1 C) (Oral)  SpO2 96% Wt Readings from Last 3 Encounters:  03/28/13 219 lb (99.338 kg)  02/03/13 217 lb 12.8 oz (98.793 kg)  01/15/13 221 lb 6.4 oz (100.426 kg)   Constitutional: She appears well-developed and well-nourished. No distress.  HENT: Head: Normocephalic and atraumatic. Ears: B TMs ok, no erythema or effusion; Nose: Nose normal. Mouth/Throat: Oropharynx is clear and moist. No oropharyngeal exudate.  Eyes: Conjunctivae and EOM are normal. Pupils are equal, round, and reactive to light. No scleral icterus.  Neck: Normal range of motion. Neck supple. No JVD present. No thyromegaly present.  Cardiovascular: Normal rate, regular rhythm and normal heart sounds.  No murmur heard. No BLE edema. Pulmonary/Chest: Effort normal and breath sounds normal. No respiratory distress. She has no wheezes.  Psychiatric: She has a normal mood and affect. Her behavior is normal. Judgment and thought content normal.   Lab Results  Component Value Date   WBC 7.7 10/22/2012   HGB 11.8* 10/22/2012   HCT 35.0* 10/22/2012   PLT 310.0 10/22/2012   GLUCOSE 94 10/22/2012   CHOL 186 10/22/2012   TRIG 187.0* 10/22/2012   HDL 44.90 10/22/2012   LDLDIRECT 122.7 11/06/2011   LDLCALC 104* 10/22/2012   ALT 21 10/22/2012   AST 20 10/22/2012   NA 137 10/22/2012   K 3.4* 10/22/2012   CL 105 10/22/2012   CREATININE 0.8 10/22/2012   BUN 15 10/22/2012   CO2 25 10/22/2012   TSH 1.88 10/22/2012   HGBA1C 5.9 12/30/2007       Assessment & Plan:   See problem list. Medications and labs reviewed today.

## 2013-05-05 NOTE — Assessment & Plan Note (Signed)
Uses low dose xanax as needed Discussed potential need for SSRI if BZ "need" is daily - rx provided we reviewed potential risk/benefit and possible side effects - pt understands and agrees to same  follow up 6 weeks to review meds and symptoms - pt agrees to call sooner of problems

## 2013-05-05 NOTE — Assessment & Plan Note (Signed)
BP Readings from Last 3 Encounters:  05/05/13 112/80  03/28/13 130/80  03/14/13 152/90  exacerbated by stress - currently well controlled The current medical regimen is effective;  continue present plan and medications.

## 2013-05-05 NOTE — Patient Instructions (Signed)
It was good to see you today. Your breathing "problem" is related to anxiety - will resume xanax as needed and start generic Zoloft daily if needing to take xanax every day Your prescription(s) have been submitted to your pharmacy. Please take as directed and contact our office if you believe you are having problem(s) with the medication(s).

## 2013-05-05 NOTE — Assessment & Plan Note (Signed)
  Wt Readings from Last 3 Encounters:  03/28/13 219 lb (99.338 kg)  02/03/13 217 lb 12.8 oz (98.793 kg)  01/15/13 221 lb 6.4 oz (100.426 kg)  The patient is asked to make an attempt to improve diet and exercise patterns to aid in medical management of this problem.  Weight trend reviewed  patient will try low carb and gluten free x 30 days and keep food journal - will report back next OV

## 2013-05-07 ENCOUNTER — Other Ambulatory Visit: Payer: Self-pay | Admitting: *Deleted

## 2013-05-07 MED ORDER — METOPROLOL TARTRATE 50 MG PO TABS: 50.0000 mg | ORAL_TABLET | Freq: Two times a day (BID) | ORAL | Status: DC

## 2013-05-12 ENCOUNTER — Telehealth: Payer: Self-pay | Admitting: Neurology

## 2013-05-15 NOTE — Telephone Encounter (Signed)
Called patient to get a little more information. She states she is very sleepy, if sit long down for a long periods of  time its hard for her to get up as if she cripple. Is this because of MS  last seen in march chart is in centricity best contact is [phone]

## 2013-05-22 ENCOUNTER — Other Ambulatory Visit: Payer: Self-pay | Admitting: *Deleted

## 2013-05-22 MED ORDER — FLUTICASONE PROPIONATE 50 MCG/ACT NA SUSP: NASAL | Status: DC

## 2013-05-22 NOTE — Telephone Encounter (Signed)
Advised patient , she can take tylenol or  Ibuprofen with this stiffness. She should wear cooling vests or neckerchief for a outside activity. She denied having had a call back from this office since she left her initial message on 05-12-13 .

## 2013-06-10 ENCOUNTER — Encounter: Payer: Self-pay | Admitting: *Deleted

## 2013-06-10 ENCOUNTER — Ambulatory Visit (INDEPENDENT_AMBULATORY_CARE_PROVIDER_SITE_OTHER): Payer: BC Managed Care – PPO | Admitting: Internal Medicine

## 2013-06-10 ENCOUNTER — Encounter: Payer: Self-pay | Admitting: Internal Medicine

## 2013-06-10 VITALS — BP 118/74 | HR 72 | Temp 98.5°F

## 2013-06-10 DIAGNOSIS — M7660 Achilles tendinitis, unspecified leg: Secondary | ICD-10-CM

## 2013-06-10 DIAGNOSIS — I10 Essential (primary) hypertension: Secondary | ICD-10-CM

## 2013-06-10 DIAGNOSIS — M7662 Achilles tendinitis, left leg: Secondary | ICD-10-CM

## 2013-06-10 MED ORDER — DICLOFENAC SODIUM 75 MG PO TBEC: 75.0000 mg | DELAYED_RELEASE_TABLET | Freq: Two times a day (BID) | ORAL | Status: DC

## 2013-06-10 NOTE — Patient Instructions (Signed)
It was good to see you today. Ice your painful heel for 10 minutes 3x/day and avoid high impact jumping or fast walking until pain improves Use diclofenac pill with food 2x/day until gone - Your prescription(s) have been submitted to your pharmacy. Please take as directed and contact our office if you believe you are having problem(s) with the medication(s). Work note provided stating it is medically necessary for you to wear tennis shoes at all times Call if pain worse or unimproved in next 4 weeks for evaluation by sports medicine specialist if needed  Achilles Tendinitis Tendinitis a swelling and soreness of the tendon. The pain in the tendon (cord-like structure which attaches muscle to bone) is produced by tiny tears and the inflammation present in that tendon. It commonly occurs at the shoulders, heels, and elbows. It is usually caused by overusing the tendon and joint involved. Achilles tendinitis involves the Achilles tendon. This is the large tendon in the back of the leg just above the foot. It attaches the large muscles of the lower leg to the heel bone (called calcaneus).   This diagnosis (learning what is wrong) is made by examination. X-rays will be generally be normal if only tendinitis is present. HOME CARE INSTRUCTIONS    Apply ice to the injury for 15 to 20 minutes, 3 to 4 times per day. Put the ice in a plastic bag and place a towel between the bag of ice and your skin.   Try to avoid use other than gentle range of motion while the tendon is painful. Do not resume use until instructed by your caregiver. Then begin use gradually. Do not increase use to the point of pain. If pain does develop, decrease use and continue the above measures. Gradually increase activities that do not cause discomfort until you gradually achieve normal use.   Only take over-the-counter or prescription medicines for pain, discomfort, or fever as directed by your caregiver.  SEEK MEDICAL CARE IF:    Your  pain and swelling increase or pain is uncontrolled with medications.   You develop new, unexplained problems (symptoms) or an increase of the symptoms that brought you to your caregiver.   You develop an inability to move your toes or foot, develop warmth and swelling in your foot, or begin running an unexplained temperature.  MAKE SURE YOU:    Understand these instructions.   Will watch your condition.   Will get help right away if you are not doing well or get worse.  Document Released: 06/14/2005 Document Revised: 11/27/2011 Document Reviewed: 04/22/2008 [facility] Patient Information 2013 [place], [place].

## 2013-06-10 NOTE — Progress Notes (Signed)
  Subjective:    Patient ID: [name] [name], female    DOB: August 16, 1961, 52 y.o.   MRN: [id number]  Foot Pain This is a recurrent problem. The current episode started more than 1 month ago. The problem occurs daily. The problem has been waxing and waning. Pertinent negatives include no arthralgias, change in bowel habit, chest pain, coughing, fatigue, fever, joint swelling, myalgias, nausea, numbness, vomiting or weakness. The symptoms are aggravated by walking. She has tried lying down and NSAIDs for the symptoms. The treatment provided moderate relief.    Past Medical History  Diagnosis Date  . METABOLIC SYNDROME X   . OBESITY, TRUNCAL   . CONSTIPATION, CHRONIC   . KELOID   . Multiple sclerosis   . Allergic rhinitis due to other allergen   . HYPERTENSION   . HYPERLIPIDEMIA   . GERD   . Angioedema     ACEI    Review of Systems  Constitutional: Negative for fever and fatigue.  Respiratory: Negative for cough and shortness of breath.   Cardiovascular: Negative for chest pain.  Gastrointestinal: Positive for constipation (chronic). Negative for nausea, vomiting, diarrhea, abdominal distention and change in bowel habit.  Genitourinary: Negative for frequency and hematuria.  Musculoskeletal: Negative for myalgias, back pain, joint swelling, arthralgias and gait problem.  Neurological: Negative for weakness and numbness.       Objective:   Physical Exam BP 118/74  Pulse 72  Temp(Src) 98.5 F (36.9 C) (Oral)  SpO2 95% Wt Readings from Last 3 Encounters:  03/28/13 219 lb (99.338 kg)  02/03/13 217 lb 12.8 oz (98.793 kg)  01/15/13 221 lb 6.4 oz (100.426 kg)   Constitutional: She is overweight but appears well-developed and well-nourished. No distress.   Neck: Normal range of motion. Neck supple. No JVD present. No thyromegaly present.  Cardiovascular: Normal rate, regular rhythm and normal heart sounds.  No murmur heard. No BLE edema. Pulmonary/Chest: Effort normal and breath  sounds normal. No respiratory distress. She has no wheezes.  Musculoskeletal: tender on medial side of Achilles of left foot - Normal range of motion, no joint effusions or soft tissue swelling. No gross deformities Skin: Skin is warm and dry. No rash Or bruise noted. No erythema.  Psychiatric: She has a normal mood and affect. Her behavior is normal. Judgment and thought content normal.   Lab Results  Component Value Date   WBC 7.7 10/22/2012   HGB 11.8* 10/22/2012   HCT 35.0* 10/22/2012   PLT 310.0 10/22/2012   GLUCOSE 94 10/22/2012   CHOL 186 10/22/2012   TRIG 187.0* 10/22/2012   HDL 44.90 10/22/2012   LDLDIRECT 122.7 11/06/2011   LDLCALC 104* 10/22/2012   ALT 21 10/22/2012   AST 20 10/22/2012   NA 137 10/22/2012   K 3.4* 10/22/2012   CL 105 10/22/2012   CREATININE 0.8 10/22/2012   BUN 15 10/22/2012   CO2 25 10/22/2012   TSH 1.88 10/22/2012   HGBA1C 5.9 12/30/2007        Assessment & Plan:   L Achilles tendonitis - no evidence for rupture on exam, hx same 07/2012 Voltaren BID and recommended shoe support: tennis shoes at all times - work note provided stating same Education on dx provided follow up sports med if continued problems or worsening symptoms

## 2013-06-10 NOTE — Assessment & Plan Note (Signed)
BP Readings from Last 3 Encounters:  06/10/13 118/74  05/05/13 112/80  03/28/13 130/80  exacerbated by stress - currently well controlled The current medical regimen is effective;  continue present plan and medications.

## 2013-06-27 ENCOUNTER — Ambulatory Visit (INDEPENDENT_AMBULATORY_CARE_PROVIDER_SITE_OTHER): Payer: BC Managed Care – PPO | Admitting: Internal Medicine

## 2013-06-27 ENCOUNTER — Encounter: Payer: Self-pay | Admitting: Internal Medicine

## 2013-06-27 VITALS — BP 120/62 | HR 86 | Temp 98.2°F | Wt 222.4 lb

## 2013-06-27 DIAGNOSIS — G35 Multiple sclerosis: Secondary | ICD-10-CM

## 2013-06-27 DIAGNOSIS — G35A Relapsing-remitting multiple sclerosis: Secondary | ICD-10-CM

## 2013-06-27 DIAGNOSIS — G35D Multiple sclerosis, unspecified: Secondary | ICD-10-CM

## 2013-06-27 DIAGNOSIS — E65 Localized adiposity: Secondary | ICD-10-CM

## 2013-06-27 MED ORDER — PREDNISONE (PAK) 10 MG PO TABS: 10.0000 mg | ORAL_TABLET | ORAL | Status: DC

## 2013-06-27 NOTE — Patient Instructions (Signed)
It was good to see you today. Treat MS flare with prednisone taper for 6 days - Your prescription(s) have been submitted to your pharmacy. Please take as directed and contact our office if you believe you are having problem(s) with the medication(s). Other medications reviewed and updated, no additional changes  if persisting symptoms despite prednisone taper, call for referral to [facility] neurologist as discussed

## 2013-06-27 NOTE — Assessment & Plan Note (Signed)
  Wt Readings from Last 3 Encounters:  06/27/13 222 lb 6.4 oz (100.88 kg)  03/28/13 219 lb (99.338 kg)  02/03/13 217 lb 12.8 oz (98.793 kg)  The patient is asked to make an attempt to improve diet and exercise patterns to aid in medical management of this problem.  Weight trend reviewed  patient will try low carb and gluten free x 30 days and keep food journal - will report back next OV

## 2013-06-27 NOTE — Progress Notes (Signed)
  Subjective:    Patient ID: [name] [name], female    DOB: [birth year], 52 y.o.   MRN: [id number]  HPI  complains of R proximal thigh numbness Report symptoms typical of usual MS flare Onset 4 days ago, unimproved Denies associated weakness, falls No back pain, no trauma  Past Medical History  Diagnosis Date  . METABOLIC SYNDROME X   . OBESITY, TRUNCAL   . CONSTIPATION, CHRONIC   . KELOID   . Multiple sclerosis   . Allergic rhinitis due to other allergen   . HYPERTENSION   . HYPERLIPIDEMIA   . GERD   . Angioedema     ACEI    Review of Systems  Respiratory: Negative for shortness of breath.   Gastrointestinal: Positive for constipation (chronic). Negative for diarrhea and abdominal distention.  Genitourinary: Negative for frequency and hematuria.  Musculoskeletal: Negative for back pain and gait problem.       Objective:   Physical Exam  BP 120/62  Pulse 86  Temp(Src) 98.2 F (36.8 C) (Oral)  Wt 222 lb 6.4 oz (100.88 kg)  BMI 37.01 kg/m2  SpO2 97% Wt Readings from Last 3 Encounters:  06/27/13 222 lb 6.4 oz (100.88 kg)  03/28/13 219 lb (99.338 kg)  02/03/13 217 lb 12.8 oz (98.793 kg)   Constitutional: She is overweight but appears well-developed and well-nourished. No distress.   Neck: Normal range of motion. Neck supple. No JVD present. No thyromegaly present.  Cardiovascular: Normal rate, regular rhythm and normal heart sounds.  No murmur heard. No BLE edema. Pulmonary/Chest: Effort normal and breath sounds normal. No respiratory distress. She has no wheezes.  Musculoskeletal: Back: full range of motion of thoracic and lumbar spine. Non tender to palpation. Negative straight leg raise. DTR's are symmetrically intact. Sensation intact in all dermatomes of the lower extremities. Full strength to manual muscle testing. patient is able to heel toe walk without difficulty and ambulates with antalgic gait. Skin: Skin is warm and dry. No rash Or bruise noted. No  erythema.  Psychiatric: She has a normal mood and affect. Her behavior is normal. Judgment and thought content normal.   Lab Results  Component Value Date   WBC 7.7 10/22/2012   HGB 11.8* 10/22/2012   HCT 35.0* 10/22/2012   PLT 310.0 10/22/2012   GLUCOSE 94 10/22/2012   CHOL 186 10/22/2012   TRIG 187.0* 10/22/2012   HDL 44.90 10/22/2012   LDLDIRECT 122.7 11/06/2011   LDLCALC 104* 10/22/2012   ALT 21 10/22/2012   AST 20 10/22/2012   NA 137 10/22/2012   K 3.4* 10/22/2012   CL 105 10/22/2012   CREATININE 0.8 10/22/2012   BUN 15 10/22/2012   CO2 25 10/22/2012   TSH 1.88 10/22/2012   HGBA1C 5.9 12/30/2007       Assessment & Plan:   See problem list. Medications and labs reviewed today.  Acute MS flare, see MS below -Pred taper x6 days, continue Copaxone

## 2013-06-27 NOTE — Assessment & Plan Note (Signed)
Stable without flares for many years until fall 2013, then resumed medical maintenance treatment: copaxone  Mild RLE flare now, numbness numbness consistent with prior flares per patient pred taper x 6 days - erx done  further eval by neuro as needed if persisting or recurring symptoms

## 2013-07-14 ENCOUNTER — Ambulatory Visit: Payer: Self-pay | Admitting: Internal Medicine

## 2013-07-22 ENCOUNTER — Ambulatory Visit (INDEPENDENT_AMBULATORY_CARE_PROVIDER_SITE_OTHER): Payer: BC Managed Care – PPO | Admitting: Internal Medicine

## 2013-07-22 ENCOUNTER — Encounter: Payer: Self-pay | Admitting: Internal Medicine

## 2013-07-22 ENCOUNTER — Telehealth: Payer: Self-pay | Admitting: *Deleted

## 2013-07-22 VITALS — BP 132/70 | HR 86 | Temp 98.6°F | Wt 222.8 lb

## 2013-07-22 DIAGNOSIS — G35 Multiple sclerosis: Secondary | ICD-10-CM

## 2013-07-22 DIAGNOSIS — J309 Allergic rhinitis, unspecified: Secondary | ICD-10-CM

## 2013-07-22 DIAGNOSIS — J019 Acute sinusitis, unspecified: Secondary | ICD-10-CM

## 2013-07-22 MED ORDER — LEVOFLOXACIN 750 MG PO TABS: 750.0000 mg | ORAL_TABLET | Freq: Every day | ORAL | Status: DC

## 2013-07-22 MED ORDER — FLUCONAZOLE 150 MG PO TABS: 150.0000 mg | ORAL_TABLET | Freq: Every day | ORAL | Status: DC

## 2013-07-22 NOTE — Progress Notes (Signed)
  Subjective:    Patient ID: [name] [name], female    DOB: 14-Feb-1961, 52 y.o.   MRN: [id number]  Sinusitis This is a new problem. The current episode started yesterday. The problem has been gradually worsening since onset. There has been no fever. The pain is mild. Associated symptoms include congestion, coughing, headaches, a hoarse voice and sinus pressure. Pertinent negatives include no chills, ear pain, shortness of breath, sneezing, sore throat or swollen glands. Past treatments include oral decongestants and saline sprays. The treatment provided mild relief.   Past Medical History  Diagnosis Date  . METABOLIC SYNDROME X   . OBESITY, TRUNCAL   . CONSTIPATION, CHRONIC   . KELOID   . Multiple sclerosis   . Allergic rhinitis due to other allergen   . HYPERTENSION   . HYPERLIPIDEMIA   . GERD   . Angioedema     ACEI     Review of Systems  Constitutional: Negative for chills.  HENT: Positive for congestion, hoarse voice and sinus pressure. Negative for ear pain, sneezing and sore throat.   Respiratory: Positive for cough. Negative for shortness of breath.   Neurological: Positive for headaches.       Objective:   Physical Exam BP 132/70  Pulse 86  Temp(Src) 98.6 F (37 C) (Oral)  Wt 222 lb 12.8 oz (101.061 kg)  SpO2 97% Wt Readings from Last 3 Encounters:  07/22/13 222 lb 12.8 oz (101.061 kg)  06/27/13 222 lb 6.4 oz (100.88 kg)  03/28/13 219 lb (99.338 kg)   Constitutional: She appears well-developed and well-nourished. No distress.  HENT: Head: Normocephalic and atraumatic. maxillary sinus tenderness. Ears: B TMs ok, no erythema or effusion; Nose: Nose normal. Mouth/Throat: Oropharynx is clear and moist. No oropharyngeal exudate.  Eyes: Conjunctivae and EOM are normal. Pupils are equal, round, and reactive to light. No scleral icterus.  Neck: Normal range of motion. Neck supple. No JVD present. No thyromegaly present.  Cardiovascular: Normal rate, regular rhythm and  normal heart sounds.  No murmur heard. No BLE edema. Pulmonary/Chest: Effort normal and breath sounds normal. No respiratory distress. She has no wheezes.  Neurological: She is alert and oriented to person, place, and time. No cranial nerve deficit. Coordination, balance, strength, speech and gait are normal.  Skin: Skin is warm and dry. No rash noted. No erythema.  Psychiatric: She has a normal mood and affect. Her behavior is normal. Judgment and thought content normal.   Lab Results  Component Value Date   WBC 7.7 10/22/2012   HGB 11.8* 10/22/2012   HCT 35.0* 10/22/2012   PLT 310.0 10/22/2012   GLUCOSE 94 10/22/2012   CHOL 186 10/22/2012   TRIG 187.0* 10/22/2012   HDL 44.90 10/22/2012   LDLDIRECT 122.7 11/06/2011   LDLCALC 104* 10/22/2012   ALT 21 10/22/2012   AST 20 10/22/2012   NA 137 10/22/2012   K 3.4* 10/22/2012   CL 105 10/22/2012   CREATININE 0.8 10/22/2012   BUN 15 10/22/2012   CO2 25 10/22/2012   TSH 1.88 10/22/2012   HGBA1C 5.9 12/30/2007       Assessment & Plan:    Viral URI > progression to acute sinusitis Cough, postnasal drip related to above   Empiric antibiotics prescribed due to symptom duration and comorbid dz/immunosippression and progression despite OTC symptomatic care Prescription cough suppression - new prescriptions done Symptomatic care with Tylenol or Advil, decongestants, antihistamine, hydration and rest -  Saline irrigation and salt gargle advised as needed

## 2013-07-22 NOTE — Assessment & Plan Note (Signed)
Stable without flares for many years until fall 2013, then resumed medical maintenance treatment: copaxone  Mild RLE flare  Early 06/2013: numbness numbness consistent with prior flares per patient -s/p pred taper x 6 days and symptoms resolved  further eval by neuro as needed if persisting or recurring symptoms

## 2013-07-22 NOTE — Assessment & Plan Note (Signed)
Chronic symptoms Continue antihistamine, nasal steroids and decongestant as needed

## 2013-07-22 NOTE — Telephone Encounter (Signed)
Pt states she fail to ask md for diflucan when she rx the antibiotic. Would like diflucan sent to her pharmacy as well...lmb

## 2013-07-22 NOTE — Patient Instructions (Addendum)
It was good to see you today.  Levaquin antibiotics - Your prescription(s) have been submitted to your pharmacy. Please take as directed and contact our office if you believe you are having problem(s) with the medication(s).  Continue allergy and sinus medications as ongoing Alternate between ibuprofen and tylenol for aches, pain and fever symptoms as discussed  Hydrate, rest and call if worse or unimproved  Sinusitis Sinusitis is redness, soreness, and swelling (inflammation) of the paranasal sinuses. Paranasal sinuses are air pockets within the bones of your face (beneath the eyes, the middle of the forehead, or above the eyes). In healthy paranasal sinuses, mucus is able to drain out, and air is able to circulate through them by way of your nose. However, when your paranasal sinuses are inflamed, mucus and air can become trapped. This can allow bacteria and other germs to grow and cause infection. Sinusitis can develop quickly and last only a short time (acute) or continue over a long period (chronic). Sinusitis that lasts for more than 12 weeks is considered chronic.  CAUSES  Causes of sinusitis include:  Allergies.  Structural abnormalities, such as displacement of the cartilage that separates your nostrils (deviated septum), which can decrease the air flow through your nose and sinuses and affect sinus drainage.  Functional abnormalities, such as when the small hairs (cilia) that line your sinuses and help remove mucus do not work properly or are not present. SYMPTOMS  Symptoms of acute and chronic sinusitis are the same. The primary symptoms are pain and pressure around the affected sinuses. Other symptoms include:  Upper toothache.  Earache.  Headache.  Bad breath.  Decreased sense of smell and taste.  A cough, which worsens when you are lying flat.  Fatigue.  Fever.  Thick drainage from your nose, which often is green and may contain pus (purulent).  Swelling and  warmth over the affected sinuses. DIAGNOSIS  Your caregiver will perform a physical exam. During the exam, your caregiver may:  Look in your nose for signs of abnormal growths in your nostrils (nasal polyps).  Tap over the affected sinus to check for signs of infection.  View the inside of your sinuses (endoscopy) with a special imaging device with a light attached (endoscope), which is inserted into your sinuses. If your caregiver suspects that you have chronic sinusitis, one or more of the following tests may be recommended:  Allergy tests.  Nasal culture A sample of mucus is taken from your nose and sent to a lab and screened for bacteria.  Nasal cytology A sample of mucus is taken from your nose and examined by your caregiver to determine if your sinusitis is related to an allergy. TREATMENT  Most cases of acute sinusitis are related to a viral infection and will resolve on their own within 10 days. Sometimes medicines are prescribed to help relieve symptoms (pain medicine, decongestants, nasal steroid sprays, or saline sprays).  However, for sinusitis related to a bacterial infection, your caregiver will prescribe antibiotic medicines. These are medicines that will help kill the bacteria causing the infection.  Rarely, sinusitis is caused by a fungal infection. In theses cases, your caregiver will prescribe antifungal medicine. For some cases of chronic sinusitis, surgery is needed. Generally, these are cases in which sinusitis recurs more than 3 times per year, despite other treatments. HOME CARE INSTRUCTIONS   Drink plenty of water. Water helps thin the mucus so your sinuses can drain more easily.  Use a humidifier.  Inhale steam 3  to 4 times a day (for example, sit in the bathroom with the shower running).  Apply a warm, moist washcloth to your face 3 to 4 times a day, or as directed by your caregiver.  Use saline nasal sprays to help moisten and clean your sinuses.  Take  over-the-counter or prescription medicines for pain, discomfort, or fever only as directed by your caregiver. SEEK IMMEDIATE MEDICAL CARE IF:  You have increasing pain or severe headaches.  You have nausea, vomiting, or drowsiness.  You have swelling around your face.  You have vision problems.  You have a stiff neck.  You have difficulty breathing. MAKE SURE YOU:   Understand these instructions.  Will watch your condition.  Will get help right away if you are not doing well or get worse. Document Released: 09/04/2005 Document Revised: 11/27/2011 Document Reviewed: 09/19/2011 [facility] Patient Information 2014 [place], [place].

## 2013-07-22 NOTE — Progress Notes (Signed)
Pre-visit discussion using our clinic review tool. No additional management support is needed unless otherwise documented below in the visit note.  

## 2013-07-24 ENCOUNTER — Other Ambulatory Visit: Payer: Self-pay

## 2013-07-28 ENCOUNTER — Telehealth: Payer: Self-pay | Admitting: *Deleted

## 2013-07-28 NOTE — Telephone Encounter (Signed)
Info noted - agree with advice as provided thanks

## 2013-07-28 NOTE — Telephone Encounter (Signed)
[facility] Triage Call Report Triage Record Num: [id number] Operator: Devin Going Patient Name: [name] [name] Call Date & Time: 07/26/2013 12:21:51PM Patient Phone: [phone] PCP: Rene Paci Patient Gender: Female PCP Fax : [phone] Patient DOB: 03-May-1961 [facility] Name: [facility] Reason for Call: Caller: [name]/Patient; PCP: Rene Paci (Adults only); CB#: [phone]; Call regarding Pt Thinks Her Joint Pain Is From the Levaquin; States was in the office 07/22/13 to treat a sinus infection. Pt. Called the Pharmacy and was told that her joint pain could be a side effect of the medication. Pt. states her sinuses are much better and would like to know if she can take Voltaren for the shoulder pain and Achilles tendon pain. Triaged per Allergic Reaction, Severe with a Call Provider within 24 hrs. for a new onset of joint pain within 10 days of starting a new medication. Provider on call paged when no answer at the office. Did not receive a Call Back from Dr. Milinda Cave. Pt. advised to go ahead and take her Voltaren as prescribed and stop if any stomach issues, since has GERD. States it has never bothered her before. Advised does not need any additional antibiotic at this time since pt. sinus infection is resolved. Will call the office on 07/28/13 if any additional concerns with her joint pain. Protocol(s) Used: Allergic Reaction, Severe Recommended Outcome per Protocol: Call Provider within 24 Hours Reason for Outcome: New onset rash, joint pain, muscle aches, swollen glands or any temperature elevation without known cause AND less than 10 days after starting new medication(s) Care Advice: ~ Speak with provider before next dose of medication is due. ~ List, or take, all current prescription(s), nonprescription or alternative medication(s) to provider for evaluation. Medication Advice: - Discontinue all nonprescription and alternative medications, especially  stimulants, until evaluated by provider. - Take prescribed medications as directed, following label instructions for the medication. - Do not change medications or dosing regimen until provider is consulted. - Know possible side effects of medication and what to do if they occur. - Tell provider all prescription, nonprescription or alternative medications that you take ~ Call EMS 911 if develop signs and symptoms of anaphylaxis within minutes to several hours of exposure: severe difficulty breathing; rapid, weak or irregular pulse; pruritus, urticaria, swelling of face, lips, tongue, or throat causing tightness or difficulty swallowing; abdominal cramping, nausea, vomiting or diarrhea. ~

## 2013-08-18 ENCOUNTER — Telehealth: Payer: Self-pay | Admitting: Neurology

## 2013-08-18 NOTE — Telephone Encounter (Signed)
I called patient and received a VM. I left VM for patient to call back with more details of when tingling started, if it's just in the hand. Is it both hands? Any associated pain? Should patient believe this is a medical emergency, please go to local ER.

## 2013-08-27 ENCOUNTER — Ambulatory Visit (INDEPENDENT_AMBULATORY_CARE_PROVIDER_SITE_OTHER): Payer: BC Managed Care – PPO | Admitting: Internal Medicine

## 2013-08-27 ENCOUNTER — Encounter: Payer: Self-pay | Admitting: Internal Medicine

## 2013-08-27 VITALS — BP 120/82 | HR 82 | Temp 98.5°F

## 2013-08-27 DIAGNOSIS — J040 Acute laryngitis: Secondary | ICD-10-CM

## 2013-08-27 DIAGNOSIS — J019 Acute sinusitis, unspecified: Secondary | ICD-10-CM

## 2013-08-27 MED ORDER — FLUCONAZOLE 150 MG PO TABS: 150.0000 mg | ORAL_TABLET | Freq: Once | ORAL | Status: DC

## 2013-08-27 MED ORDER — PREDNISONE (PAK) 10 MG PO TABS: ORAL_TABLET | ORAL | Status: DC

## 2013-08-27 MED ORDER — AMOXICILLIN 500 MG PO CAPS: 500.0000 mg | ORAL_CAPSULE | Freq: Three times a day (TID) | ORAL | Status: DC

## 2013-08-27 NOTE — Patient Instructions (Signed)
It was good to see you today.  Amoxicillin antibiotics and Prednisone taper (also diflucan)- Your prescription(s) have been submitted to your pharmacy. Please take as directed and contact our office if you believe you are having problem(s) with the medication(s).  Continue allergy and sinus medications as ongoing Alternate between ibuprofen and tylenol for aches, pain and fever symptoms as discussed  Hydrate, rest and call if worse or unimproved  Sinusitis Sinusitis is redness, soreness, and swelling (inflammation) of the paranasal sinuses. Paranasal sinuses are air pockets within the bones of your face (beneath the eyes, the middle of the forehead, or above the eyes). In healthy paranasal sinuses, mucus is able to drain out, and air is able to circulate through them by way of your nose. However, when your paranasal sinuses are inflamed, mucus and air can become trapped. This can allow bacteria and other germs to grow and cause infection. Sinusitis can develop quickly and last only a short time (acute) or continue over a long period (chronic). Sinusitis that lasts for more than 12 weeks is considered chronic.  CAUSES  Causes of sinusitis include:  Allergies.  Structural abnormalities, such as displacement of the cartilage that separates your nostrils (deviated septum), which can decrease the air flow through your nose and sinuses and affect sinus drainage.  Functional abnormalities, such as when the small hairs (cilia) that line your sinuses and help remove mucus do not work properly or are not present. SYMPTOMS  Symptoms of acute and chronic sinusitis are the same. The primary symptoms are pain and pressure around the affected sinuses. Other symptoms include:  Upper toothache.  Earache.  Headache.  Bad breath.  Decreased sense of smell and taste.  A cough, which worsens when you are lying flat.  Fatigue.  Fever.  Thick drainage from your nose, which often is green and may  contain pus (purulent).  Swelling and warmth over the affected sinuses. DIAGNOSIS  Your caregiver will perform a physical exam. During the exam, your caregiver may:  Look in your nose for signs of abnormal growths in your nostrils (nasal polyps).  Tap over the affected sinus to check for signs of infection.  View the inside of your sinuses (endoscopy) with a special imaging device with a light attached (endoscope), which is inserted into your sinuses. If your caregiver suspects that you have chronic sinusitis, one or more of the following tests may be recommended:  Allergy tests.  Nasal culture A sample of mucus is taken from your nose and sent to a lab and screened for bacteria.  Nasal cytology A sample of mucus is taken from your nose and examined by your caregiver to determine if your sinusitis is related to an allergy. TREATMENT  Most cases of acute sinusitis are related to a viral infection and will resolve on their own within 10 days. Sometimes medicines are prescribed to help relieve symptoms (pain medicine, decongestants, nasal steroid sprays, or saline sprays).  However, for sinusitis related to a bacterial infection, your caregiver will prescribe antibiotic medicines. These are medicines that will help kill the bacteria causing the infection.  Rarely, sinusitis is caused by a fungal infection. In theses cases, your caregiver will prescribe antifungal medicine. For some cases of chronic sinusitis, surgery is needed. Generally, these are cases in which sinusitis recurs more than 3 times per year, despite other treatments. HOME CARE INSTRUCTIONS   Drink plenty of water. Water helps thin the mucus so your sinuses can drain more easily.  Use a humidifier.  Inhale steam 3 to 4 times a day (for example, sit in the bathroom with the shower running).  Apply a warm, moist washcloth to your face 3 to 4 times a day, or as directed by your caregiver.  Use saline nasal sprays to help  moisten and clean your sinuses.  Take over-the-counter or prescription medicines for pain, discomfort, or fever only as directed by your caregiver. SEEK IMMEDIATE MEDICAL CARE IF:  You have increasing pain or severe headaches.  You have nausea, vomiting, or drowsiness.  You have swelling around your face.  You have vision problems.  You have a stiff neck.  You have difficulty breathing. MAKE SURE YOU:   Understand these instructions.  Will watch your condition.  Will get help right away if you are not doing well or get worse. Document Released: 09/04/2005 Document Revised: 11/27/2011 Document Reviewed: 09/19/2011 [facility] Patient Information 2014 [place], [place].

## 2013-08-27 NOTE — Progress Notes (Signed)
   Subjective:    Patient ID: [name] [name], female    DOB: November 05, 1960, 52 y.o.   MRN: [id number]  URI  This is a new problem. The current episode started 1 to 4 weeks ago. The problem has been waxing and waning. The maximum temperature recorded prior to her arrival was 100 - 100.9 F. Associated symptoms include congestion, coughing, headaches, sinus pain and a sore throat. Pertinent negatives include no chest pain, diarrhea, dysuria, ear pain, nausea, neck pain, plugged ear sensation, rash, swollen glands or vomiting. She has tried antihistamine and increased fluids for the symptoms. The treatment provided mild relief.   Past Medical History  Diagnosis Date  . METABOLIC SYNDROME X   . OBESITY, TRUNCAL   . CONSTIPATION, CHRONIC   . KELOID   . Multiple sclerosis   . Allergic rhinitis due to other allergen   . HYPERTENSION   . HYPERLIPIDEMIA   . GERD   . Angioedema     ACEI     Review of Systems  HENT: Positive for congestion and sore throat. Negative for ear pain.   Respiratory: Positive for cough.   Cardiovascular: Negative for chest pain.  Gastrointestinal: Negative for nausea, vomiting and diarrhea.  Genitourinary: Negative for dysuria.  Musculoskeletal: Negative for neck pain.  Skin: Negative for rash.  Neurological: Positive for headaches.       Objective:   Physical Exam BP 120/82  Pulse 82  Temp(Src) 98.5 F (36.9 C) (Oral)  SpO2 95% Wt Readings from Last 3 Encounters:  07/22/13 222 lb 12.8 oz (101.061 kg)  06/27/13 222 lb 6.4 oz (100.88 kg)  03/28/13 219 lb (99.338 kg)   GEN: mildly ill appearing and audible head/chest congestion - hoarse HENT: NCAT, mild sinus tenderness bilaterally, nares with clear discharge, oropharynx mild erythema, no exudate Eyes: Vision grossly intact, no conjunctivitis Lungs: Clear to auscultation without rhonchi or wheeze, no increased work of breathing Cardiovascular: Regular rate and rhythm, no bilateral edema  Lab Results    Component Value Date   WBC 7.7 10/22/2012   HGB 11.8* 10/22/2012   HCT 35.0* 10/22/2012   PLT 310.0 10/22/2012   GLUCOSE 94 10/22/2012   CHOL 186 10/22/2012   TRIG 187.0* 10/22/2012   HDL 44.90 10/22/2012   LDLDIRECT 122.7 11/06/2011   LDLCALC 104* 10/22/2012   ALT 21 10/22/2012   AST 20 10/22/2012   NA 137 10/22/2012   K 3.4* 10/22/2012   CL 105 10/22/2012   CREATININE 0.8 10/22/2012   BUN 15 10/22/2012   CO2 25 10/22/2012   TSH 1.88 10/22/2012   HGBA1C 5.9 12/30/2007       Assessment & Plan:   Acute sinusitis, complicating chronic sinusitis symptoms Cough, postnasal drip related to above Laryngitis, due to above  Amoxicillin Empiric antibiotics prescribed due to symptom duration greater than 7 days OTC cough suppression - new prescriptions done Symptomatic care with Tylenol or Advil, hydration and rest -  salt gargle advised as needed

## 2013-08-27 NOTE — Progress Notes (Signed)
Pre-visit discussion using our clinic review tool. No additional management support is needed unless otherwise documented below in the visit note.  

## 2013-08-29 ENCOUNTER — Telehealth: Payer: Self-pay | Admitting: *Deleted

## 2013-08-29 NOTE — Telephone Encounter (Signed)
Pt left msg on vm stating md rx amoxicillin on Wednesday currently taking, but she is still coughing constantly. Phlegm doesn't have color. Requesting recommendation on cough. Pls advise since md is out of office...Raechel Chute

## 2013-08-29 NOTE — Telephone Encounter (Signed)
Cough following upper respiratory infection is not unusual. Often it is caused by post-nasal drip. She should rinse sinuses daily with Neilmed sinus rinse to clear nasal passages & decrease post-nasal drip. Cough suppressant should only be used at night, She may use delsym 12 hr OTC. No other treatment change recommended unless she is having chest pain with inspiration. In this case she should be re-evaluated.

## 2013-08-29 NOTE — Telephone Encounter (Signed)
Pt called states she was seen on 12.10.14 treated for sinus infection.  Pt now complains of cough.  Please advise

## 2013-08-29 NOTE — Telephone Encounter (Signed)
Notified pt with md response.../lmb 

## 2013-09-23 ENCOUNTER — Other Ambulatory Visit: Payer: Self-pay

## 2013-09-23 DIAGNOSIS — Z1231 Encounter for screening mammogram for malignant neoplasm of breast: Secondary | ICD-10-CM

## 2013-10-09 ENCOUNTER — Other Ambulatory Visit: Payer: Self-pay | Admitting: Internal Medicine

## 2013-10-12 ENCOUNTER — Other Ambulatory Visit: Payer: Self-pay | Admitting: Neurology

## 2013-10-12 ENCOUNTER — Other Ambulatory Visit: Payer: Self-pay | Admitting: Internal Medicine

## 2013-10-28 ENCOUNTER — Other Ambulatory Visit: Payer: Self-pay

## 2013-11-07 ENCOUNTER — Ambulatory Visit (INDEPENDENT_AMBULATORY_CARE_PROVIDER_SITE_OTHER): Payer: BC Managed Care – PPO | Admitting: Internal Medicine

## 2013-11-07 ENCOUNTER — Encounter: Payer: Self-pay | Admitting: Internal Medicine

## 2013-11-07 VITALS — BP 122/82 | HR 79 | Temp 99.3°F

## 2013-11-07 DIAGNOSIS — J329 Chronic sinusitis, unspecified: Secondary | ICD-10-CM

## 2013-11-07 DIAGNOSIS — J019 Acute sinusitis, unspecified: Secondary | ICD-10-CM

## 2013-11-07 MED ORDER — PHENYLEPH-PROMETHAZINE-COD 5-6.25-10 MG/5ML PO SYRP: 5.0000 mL | ORAL_SOLUTION | ORAL | Status: DC | PRN

## 2013-11-07 MED ORDER — AZITHROMYCIN 250 MG PO TABS: ORAL_TABLET | ORAL | Status: DC

## 2013-11-07 NOTE — Progress Notes (Signed)
    Subjective:    HPI  complains of cold and sinus symptoms  Onset >1 week ago, progressively worsening symptoms  associated with mild headache and low grade fever Also myalgias, sinus pressure and mild chest congestion No relief with OTC meds Precipitated by sick contacts and weather change  Past Medical History  Diagnosis Date  . METABOLIC SYNDROME X   . OBESITY, TRUNCAL   . CONSTIPATION, CHRONIC   . KELOID   . Multiple sclerosis   . Allergic rhinitis due to other allergen   . HYPERTENSION   . HYPERLIPIDEMIA   . GERD   . Angioedema     ACEI    Review of Systems Constitutional: No fever or night sweats, no unexpected weight change Pulmonary: No pleurisy or hemoptysis Cardiovascular: No chest pain or palpitations     Objective:   Physical Exam BP 122/82  Pulse 79  Temp(Src) 99.3 F (37.4 C) (Oral)  SpO2 98% GEN: mildly ill appearing and audible head/chest congestion HENT: NCAT, mild sinus tenderness bilaterally, nares with clear discharge but left greater than right turbinate swelling, oropharynx mild erythema, no exudate Eyes: Vision grossly intact, no conjunctivitis Lungs: Clear to auscultation without rhonchi or wheeze, no increased work of breathing Cardiovascular: Regular rate and rhythm, no bilateral edema      Assessment & Plan:   Acute on chronic sinusitis Increased sinus pressure with low-grade fever and thick discharge. Complicated by chronic immunosuppression for treatment of MS   Empiric antibiotics prescribed due to symptom duration greater than 7 days and comorbid disease Prescription cough suppression - new prescriptions done Symptomatic care with Tylenol or Advil, hydration and rest -  salt gargle advised as needed

## 2013-11-07 NOTE — Patient Instructions (Addendum)
It was good to see you today.  Zpak antibiotics and prescription cough syrup - Your prescription(s) have been submitted to your pharmacy.  Please take as directed and contact our office if you believe you are having problem(s) with the medication(s).  Alternate between ibuprofen and tylenol for aches, pain and fever symptoms as discussed  Hydrate, rest and call if worse or unimproved  Sinusitis Sinusitis is redness, soreness, and swelling (inflammation) of the paranasal sinuses. Paranasal sinuses are air pockets within the bones of your face (beneath the eyes, the middle of the forehead, or above the eyes). In healthy paranasal sinuses, mucus is able to drain out, and air is able to circulate through them by way of your nose. However, when your paranasal sinuses are inflamed, mucus and air can become trapped. This can allow bacteria and other germs to grow and cause infection. Sinusitis can develop quickly and last only a short time (acute) or continue over a long period (chronic). Sinusitis that lasts for more than 12 weeks is considered chronic.  CAUSES  Causes of sinusitis include:  Allergies.  Structural abnormalities, such as displacement of the cartilage that separates your nostrils (deviated septum), which can decrease the air flow through your nose and sinuses and affect sinus drainage.  Functional abnormalities, such as when the small hairs (cilia) that line your sinuses and help remove mucus do not work properly or are not present. SYMPTOMS  Symptoms of acute and chronic sinusitis are the same. The primary symptoms are pain and pressure around the affected sinuses. Other symptoms include:  Upper toothache.  Earache.  Headache.  Bad breath.  Decreased sense of smell and taste.  A cough, which worsens when you are lying flat.  Fatigue.  Fever.  Thick drainage from your nose, which often is green and may contain pus (purulent).  Swelling and warmth over the affected  sinuses. DIAGNOSIS  Your caregiver will perform a physical exam. During the exam, your caregiver may:  Look in your nose for signs of abnormal growths in your nostrils (nasal polyps).  Tap over the affected sinus to check for signs of infection.  View the inside of your sinuses (endoscopy) with a special imaging device with a light attached (endoscope), which is inserted into your sinuses. If your caregiver suspects that you have chronic sinusitis, one or more of the following tests may be recommended:  Allergy tests.  Nasal culture A sample of mucus is taken from your nose and sent to a lab and screened for bacteria.  Nasal cytology A sample of mucus is taken from your nose and examined by your caregiver to determine if your sinusitis is related to an allergy. TREATMENT  Most cases of acute sinusitis are related to a viral infection and will resolve on their own within 10 days. Sometimes medicines are prescribed to help relieve symptoms (pain medicine, decongestants, nasal steroid sprays, or saline sprays).  However, for sinusitis related to a bacterial infection, your caregiver will prescribe antibiotic medicines. These are medicines that will help kill the bacteria causing the infection.  Rarely, sinusitis is caused by a fungal infection. In theses cases, your caregiver will prescribe antifungal medicine. For some cases of chronic sinusitis, surgery is needed. Generally, these are cases in which sinusitis recurs more than 3 times per year, despite other treatments. HOME CARE INSTRUCTIONS   Drink plenty of water. Water helps thin the mucus so your sinuses can drain more easily.  Use a humidifier.  Inhale steam 3 to 4  times a day (for example, sit in the bathroom with the shower running).  Apply a warm, moist washcloth to your face 3 to 4 times a day, or as directed by your caregiver.  Use saline nasal sprays to help moisten and clean your sinuses.  Take over-the-counter or  prescription medicines for pain, discomfort, or fever only as directed by your caregiver. SEEK IMMEDIATE MEDICAL CARE IF:  You have increasing pain or severe headaches.  You have nausea, vomiting, or drowsiness.  You have swelling around your face.  You have vision problems.  You have a stiff neck.  You have difficulty breathing. MAKE SURE YOU:   Understand these instructions.  Will watch your condition.  Will get help right away if you are not doing well or get worse. Document Released: 09/04/2005 Document Revised: 11/27/2011 Document Reviewed: 09/19/2011 [facility] Patient Information 2014 [place], [place].

## 2013-11-07 NOTE — Progress Notes (Signed)
Pre-visit discussion using our clinic review tool. No additional management support is needed unless otherwise documented below in the visit note.  

## 2013-11-10 ENCOUNTER — Other Ambulatory Visit: Payer: Self-pay | Admitting: *Deleted

## 2013-11-10 MED ORDER — AZELASTINE HCL 0.1 % NA SOLN: 2.0000 | Freq: Two times a day (BID) | NASAL | Status: DC

## 2013-11-12 ENCOUNTER — Telehealth: Payer: Self-pay | Admitting: *Deleted

## 2013-11-12 NOTE — Telephone Encounter (Signed)
Notified pt with md response.../lmb 

## 2013-11-12 NOTE — Telephone Encounter (Signed)
Left msg on vm stating wanting to start a new detox, but want md to advise on the information first. Faxed over ingredients place on md desk to review...Johny Chess

## 2013-11-12 NOTE — Telephone Encounter (Signed)
I have reviewed [employer] ingredients and see nothing of medical concern

## 2013-11-14 ENCOUNTER — Encounter: Payer: Self-pay | Admitting: *Deleted

## 2013-11-17 ENCOUNTER — Telehealth: Payer: Self-pay | Admitting: *Deleted

## 2013-11-17 ENCOUNTER — Ambulatory Visit: Payer: Self-pay | Admitting: Nurse Practitioner

## 2013-11-17 ENCOUNTER — Telehealth: Payer: Self-pay | Admitting: Nurse Practitioner

## 2013-11-17 DIAGNOSIS — Z0000 Encounter for general adult medical examination without abnormal findings: Secondary | ICD-10-CM

## 2013-11-17 NOTE — Telephone Encounter (Signed)
Called patient to inform her that Hoyle Sauer, NP has no other openings until June and that she is currently on the wait list. I advised the patient that if she has any other problems, questions or concerns to call the office. Patient verbalized understanding.

## 2013-11-17 NOTE — Telephone Encounter (Signed)
Pt stated she needed a letter by md for her to end contract with the gym. Requesting md to write her one. Inform pt md is out of office this week. Will return back on 11/24/13. Pt states will call back on Monday to request letter. Also want cpx labs entered. Pt has [employer] entered standard cps labs...Johny Chess

## 2013-11-17 NOTE — Telephone Encounter (Signed)
Patient arrived for her appointment to find that [name] [name] is out sick. Next available apt is in June. Patient would like to be seen sooner if at all possible. Please call to advise.

## 2013-11-18 DIAGNOSIS — Z0289 Encounter for other administrative examinations: Secondary | ICD-10-CM

## 2013-11-19 ENCOUNTER — Encounter: Payer: Self-pay | Admitting: Internal Medicine

## 2013-11-21 ENCOUNTER — Ambulatory Visit
Admission: RE | Admit: 2013-11-21 | Discharge: 2013-11-21 | Disposition: A | Discharge status: Home / Self Care | Payer: BC Managed Care – PPO | Source: Ambulatory Visit

## 2013-11-21 ENCOUNTER — Other Ambulatory Visit: Payer: Self-pay | Admitting: Internal Medicine

## 2013-11-21 DIAGNOSIS — Z1231 Encounter for screening mammogram for malignant neoplasm of breast: Secondary | ICD-10-CM

## 2013-11-24 ENCOUNTER — Encounter: Payer: Self-pay | Admitting: *Deleted

## 2013-11-24 NOTE — Telephone Encounter (Signed)
Lorre Nick - ok to generate letter stating pt is unable to participate in gym activities due to medical problems ongoing at this time and therefore should be released from her contract. Please print and I will sign. Thanks

## 2013-11-24 NOTE — Telephone Encounter (Signed)
Pt sent email on Friday requesting letter...[name] [name]

## 2013-11-26 ENCOUNTER — Telehealth: Payer: Self-pay | Admitting: Neurology

## 2013-11-26 NOTE — Telephone Encounter (Signed)
Pt called to check on the status of the forms she brought to the office on 11-18-13. The forms are still pending. Informed the Pt that the normal time for processing these forms is 10-14 business days. She was annoyed with my answer and hung up.  FYI

## 2013-11-27 NOTE — Telephone Encounter (Signed)
Called patient to inform her that her forms was not ready and it takes at least 14 business days to complete and the office received her forms on 11/18/13. I advised the patient to call back around 12/08/13, that is the 14 day period and patient's forms should be ready. I was advising the patient that if she has any other problems, questions or concerns to call the office, but patient hung up on me.

## 2013-11-27 NOTE — Telephone Encounter (Signed)
Pt called in to check the status of her forms. They are still pending and let her know the normal time frame for these forms to be processed. Again, was not happy with my answer. Please contact the Pt.  Thank you

## 2013-11-28 NOTE — Telephone Encounter (Signed)
Patient calling to follow up on status of her forms. Please call patient and advise.

## 2013-12-02 ENCOUNTER — Encounter: Payer: Self-pay | Admitting: Internal Medicine

## 2013-12-08 ENCOUNTER — Telehealth: Payer: Self-pay | Admitting: Neurology

## 2013-12-08 NOTE — Telephone Encounter (Signed)
Spoke to patient's daughter regarding her FMLA forms.  Patient said they will have to be redone because there was an incorrect spelling in name and handwriting was hard to read in one of the words.  Also there was a word crossed through.  I told her I would redo the forms, but we are not able to white out on the forms and I can't guarantee that something else may not have to be crossed out.  Patient's daughter was very ill manned to me, saying it was unprofessionally done, and she will bring another form and she hung up on me.

## 2013-12-08 NOTE — Telephone Encounter (Signed)
Sorry, I believe I was speaking to the patient and not to the daughter.  The forms were for the daughter, not sure why mother had the forms.

## 2013-12-20 ENCOUNTER — Other Ambulatory Visit: Payer: Self-pay | Admitting: Neurology

## 2014-01-02 ENCOUNTER — Other Ambulatory Visit: Payer: Self-pay | Admitting: Urology

## 2014-01-02 DIAGNOSIS — G35 Multiple sclerosis: Secondary | ICD-10-CM

## 2014-01-09 ENCOUNTER — Other Ambulatory Visit: Payer: Self-pay

## 2014-01-12 ENCOUNTER — Other Ambulatory Visit: Payer: Self-pay | Admitting: Internal Medicine

## 2014-01-13 ENCOUNTER — Encounter: Payer: Self-pay | Admitting: Internal Medicine

## 2014-01-14 ENCOUNTER — Other Ambulatory Visit (INDEPENDENT_AMBULATORY_CARE_PROVIDER_SITE_OTHER): Payer: BC Managed Care – PPO

## 2014-01-14 DIAGNOSIS — Z0000 Encounter for general adult medical examination without abnormal findings: Secondary | ICD-10-CM

## 2014-01-14 LAB — URINALYSIS, ROUTINE W REFLEX MICROSCOPIC
Bilirubin Urine: NEGATIVE
Hgb urine dipstick: NEGATIVE
Ketones, ur: NEGATIVE
Leukocytes, UA: NEGATIVE
Nitrite: NEGATIVE
PH: 5.5 (ref 5.0–8.0)
RBC / HPF: NONE SEEN (ref 0–?)
Specific Gravity, Urine: <=1.005 — AB (ref 1.000–1.030)
TOTAL PROTEIN, URINE-UPE24: NEGATIVE
URINE GLUCOSE: NEGATIVE
Urobilinogen, UA: 0.2 (ref 0.0–1.0)

## 2014-01-14 LAB — CBC WITH DIFFERENTIAL/PLATELET
Basophils Absolute: 0.1 10*3/uL (ref 0.0–0.1)
Basophils Relative: 0.7 % (ref 0.0–3.0)
EOS PCT: 3.8 % (ref 0.0–5.0)
Eosinophils Absolute: 0.3 10*3/uL (ref 0.0–0.7)
HEMATOCRIT: 38.1 % (ref 36.0–46.0)
Hemoglobin: 13 g/dL (ref 12.0–15.0)
LYMPHS ABS: 3.2 10*3/uL (ref 0.7–4.0)
Lymphocytes Relative: 37.1 % (ref 12.0–46.0)
MCHC: 34 g/dL (ref 30.0–36.0)
MCV: 84.7 fl (ref 78.0–100.0)
MONOS PCT: 6.8 % (ref 3.0–12.0)
Monocytes Absolute: 0.6 10*3/uL (ref 0.1–1.0)
Neutro Abs: 4.4 10*3/uL (ref 1.4–7.7)
Neutrophils Relative %: 51.6 % (ref 43.0–77.0)
Platelets: 299 10*3/uL (ref 150.0–400.0)
RBC: 4.5 Mil/uL (ref 3.87–5.11)
RDW: 15.3 % — ABNORMAL HIGH (ref 11.5–14.6)
WBC: 8.6 10*3/uL (ref 4.5–10.5)

## 2014-01-14 LAB — TSH: TSH: 1.86 u[IU]/mL (ref 0.35–5.50)

## 2014-01-14 LAB — LIPID PANEL
CHOLESTEROL: 196 mg/dL (ref 0–200)
HDL: 45.5 mg/dL (ref >39.00)
LDL CALC: 109 mg/dL — AB (ref 0–99)
Total CHOL/HDL Ratio: 4
Triglycerides: 206 mg/dL — ABNORMAL HIGH (ref 0.0–149.0)
VLDL: 41.2 mg/dL — AB (ref 0.0–40.0)

## 2014-01-14 LAB — BASIC METABOLIC PANEL
BUN: 16 mg/dL (ref 6–23)
CHLORIDE: 102 meq/L (ref 96–112)
CO2: 27 meq/L (ref 19–32)
CREATININE: 0.9 mg/dL (ref 0.4–1.2)
Calcium: 9.5 mg/dL (ref 8.4–10.5)
GFR: 90.45 mL/min (ref >60.00)
Glucose, Bld: 90 mg/dL (ref 70–99)
Potassium: 3.6 mEq/L (ref 3.5–5.1)
SODIUM: 137 meq/L (ref 135–145)

## 2014-01-14 LAB — HEPATIC FUNCTION PANEL
ALT: 20 U/L (ref 0–35)
AST: 22 U/L (ref 0–37)
Albumin: 4.2 g/dL (ref 3.5–5.2)
Alkaline Phosphatase: 77 U/L (ref 39–117)
BILIRUBIN DIRECT: 0.1 mg/dL (ref 0.0–0.3)
Total Bilirubin: 0.6 mg/dL (ref 0.3–1.2)
Total Protein: 7.5 g/dL (ref 6.0–8.3)

## 2014-01-19 ENCOUNTER — Ambulatory Visit (INDEPENDENT_AMBULATORY_CARE_PROVIDER_SITE_OTHER): Payer: BC Managed Care – PPO | Admitting: Internal Medicine

## 2014-01-19 ENCOUNTER — Encounter: Payer: Self-pay | Admitting: Internal Medicine

## 2014-01-19 VITALS — BP 112/72 | HR 83 | Temp 98.8°F | Ht 65.0 in | Wt 225.4 lb

## 2014-01-19 DIAGNOSIS — I10 Essential (primary) hypertension: Secondary | ICD-10-CM

## 2014-01-19 DIAGNOSIS — Z0000 Encounter for general adult medical examination without abnormal findings: Secondary | ICD-10-CM

## 2014-01-19 DIAGNOSIS — E65 Localized adiposity: Secondary | ICD-10-CM

## 2014-01-19 MED ORDER — PHENTERMINE HCL 37.5 MG PO TABS: 37.5000 mg | ORAL_TABLET | Freq: Every day | ORAL | Status: DC

## 2014-01-19 MED ORDER — COMPLETENATE 29-1 MG PO CHEW: 1.0000 | CHEWABLE_TABLET | Freq: Every day | ORAL | Status: DC

## 2014-01-19 NOTE — Progress Notes (Signed)
Pre visit review using our clinic review tool, if applicable. No additional management support is needed unless otherwise documented below in the visit note. 

## 2014-01-19 NOTE — Patient Instructions (Addendum)
It was good to see you today.  We have reviewed your prior records including labs and tests today  Health Maintenance reviewed - all recommended immunizations and age-appropriate screenings are up-to-date.  Medications reviewed and updated  Will use Phentermine to help you reach your weight loss goals - today prescription for 1st of 3 months provided - If side effects or other problems, please stop medication and call us.  Please return in 1 month (nurse visit for weight check) before refill will be given  Please schedule followup in 3 months for visit with me to recheck weight and review, call sooner if problems.  Exercise to Lose Weight Exercise and a healthy diet may help you lose weight. Your doctor may suggest specific exercises. EXERCISE IDEAS AND TIPS  Choose low-cost things you enjoy doing, such as walking, bicycling, or exercising to workout videos.  Take stairs instead of the elevator.  Walk during your lunch break.  Park your car further away from work or school.  Go to a gym or an exercise class.  Start with 5 to 10 minutes of exercise each day. Build up to 30 minutes of exercise 4 to 6 days a week.  Wear shoes with good support and comfortable clothes.  Stretch before and after working out.  Work out until you breathe harder and your heart beats faster.  Drink extra water when you exercise.  Do not do so much that you hurt yourself, feel dizzy, or get very short of breath. Exercises that burn about 150 calories:  Running 1  miles in 15 minutes.  Playing volleyball for 45 to 60 minutes.  Washing and waxing a car for 45 to 60 minutes.  Playing touch football for 45 minutes.  Walking 1  miles in 35 minutes.  Pushing a stroller 1  miles in 30 minutes.  Playing basketball for 30 minutes.  Raking leaves for 30 minutes.  Bicycling 5 miles in 30 minutes.  Walking 2 miles in 30 minutes.  Dancing for 30 minutes.  Shoveling snow for 15  minutes.  Swimming laps for 20 minutes.  Walking up stairs for 15 minutes.  Bicycling 4 miles in 15 minutes.  Gardening for 30 to 45 minutes.  Jumping rope for 15 minutes.  Washing windows or floors for 45 to 60 minutes. Document Released: 10/07/2010 Document Revised: 11/27/2011 Document Reviewed: 10/07/2010 [facility] Patient Information 2014 [place], [place].

## 2014-01-19 NOTE — Assessment & Plan Note (Signed)
  Wt Readings from Last 3 Encounters:  01/19/14 225 lb 6.4 oz (102.241 kg)  07/22/13 222 lb 12.8 oz (101.061 kg)  06/27/13 222 lb 6.4 oz (100.88 kg)  The patient is asked to make an attempt to improve diet and exercise patterns to aid in medical management of this problem.  Weight trend reviewed  Will use Phentermine -we reviewed potential risk/benefit and possible side effects - pt understands and agrees to same  Nurse visit in 1 month before any refill provided

## 2014-01-19 NOTE — Assessment & Plan Note (Signed)
BP Readings from Last 3 Encounters:  01/19/14 112/72  11/07/13 122/82  08/27/13 120/82  exacerbated by stress - currently well controlled The current medical regimen is effective;  continue present plan and medications.

## 2014-01-19 NOTE — Progress Notes (Signed)
Subjective:    Patient ID: [name] [name], female    DOB: 1961-07-30, 53 y.o.   MRN: [id number]  HPI  patient is here today for annual physical. Patient feels well and has no complaints.  Also reviewed chronic medical issues and interval medical events  Past Medical History  Diagnosis Date  . METABOLIC SYNDROME X   . OBESITY, TRUNCAL   . CONSTIPATION, CHRONIC   . KELOID   . Multiple sclerosis   . Allergic rhinitis due to other allergen   . HYPERTENSION   . HYPERLIPIDEMIA   . GERD   . Angioedema     ACEI  . Colon polyps     adenomatous  . Migraine headache    Family History  Problem Relation Age of Onset  . Coronary artery disease Mother   . Heart disease Mother   . Hypertension Other   . Hyperlipidemia Other   . Diabetes Other    History  Substance Use Topics  . Smoking status: Never Smoker   . Smokeless tobacco: Never Used     Comment: Single- brother staying with pt since mom's death  . Alcohol Use: No    Review of Systems  Constitutional: Negative for fatigue and unexpected weight change.  Respiratory: Negative for cough, shortness of breath and wheezing.   Cardiovascular: Negative for chest pain, palpitations and leg swelling.  Gastrointestinal: Negative for nausea, abdominal pain and diarrhea.  Neurological: Positive for facial asymmetry. Negative for dizziness, weakness, light-headedness and headaches.  Psychiatric/Behavioral: Negative for dysphoric mood. The patient is not nervous/anxious.   All other systems reviewed and are negative.      Objective:   Physical Exam  BP 112/72  Pulse 83  Temp(Src) 98.8 F (37.1 C) (Oral)  Ht 5\' 5"  (1.651 m)  Wt 225 lb 6.4 oz (102.241 kg)  BMI 37.51 kg/m2  SpO2 97% Wt Readings from Last 3 Encounters:  01/19/14 225 lb 6.4 oz (102.241 kg)  07/22/13 222 lb 12.8 oz (101.061 kg)  06/27/13 222 lb 6.4 oz (100.88 kg)   Constitutional: She is obese, but appears well-developed and well-nourished. No distress.    HENT: Head: Normocephalic and atraumatic. Ears: B TMs ok, no erythema or effusion; Nose: Nose normal. Mouth/Throat: Oropharynx is clear and moist. No oropharyngeal exudate.  Eyes: Conjunctivae and EOM are normal. Pupils are equal, round, and reactive to light. No scleral icterus.  Neck: Normal range of motion. Neck supple. No JVD present. No thyromegaly present.  Cardiovascular: Normal rate, regular rhythm and normal heart sounds.  No murmur heard. No BLE edema. Pulmonary/Chest: Effort normal and breath sounds normal. No respiratory distress. She has no wheezes.  Abdominal: Soft. Bowel sounds are normal. She exhibits no distension. There is no tenderness. no masses Musculoskeletal: Normal range of motion, no joint effusions. No gross deformities Neurological: She is alert and oriented to person, place, and time. No cranial nerve deficit. Coordination, balance, strength, speech and gait are normal.  Skin: Skin is warm and dry. No rash noted. No erythema.  Psychiatric: She has a normal mood and affect. Her behavior is normal. Judgment and thought content normal.    Lab Results  Component Value Date   WBC 8.6 01/14/2014   HGB 13.0 01/14/2014   HCT 38.1 01/14/2014   PLT 299.0 01/14/2014   GLUCOSE 90 01/14/2014   CHOL 196 01/14/2014   TRIG 206.0* 01/14/2014   HDL 45.50 01/14/2014   LDLDIRECT 122.7 11/06/2011   LDLCALC 109* 01/14/2014   ALT 20 01/14/2014  AST 22 01/14/2014   NA 137 01/14/2014   K 3.6 01/14/2014   CL 102 01/14/2014   CREATININE 0.9 01/14/2014   BUN 16 01/14/2014   CO2 27 01/14/2014   TSH 1.86 01/14/2014   HGBA1C 5.9 12/30/2007    Mm Screening Breast Tomo Bilateral  11/21/2013   CLINICAL DATA:  Screening.  EXAM: DIGITAL SCREENING BILATERAL MAMMOGRAM WITH 3D TOMO WITH CAD  COMPARISON:  Previous exam(s).  ACR Breast Density Category c: The breast tissue is heterogeneously dense, which may obscure small masses.  FINDINGS: There are no findings suspicious for malignancy. Images were processed  with CAD.  IMPRESSION: No mammographic evidence of malignancy. A result letter of this screening mammogram will be mailed directly to the patient.  RECOMMENDATION: Screening mammogram in one year. (Code:SM-B-01Y)  BI-RADS CATEGORY  1: Negative.   Electronically Signed   By: Lajean Manes M.D.   On: 11/21/2013 12:24   ECG: NSR @ 81 bpm    Assessment & Plan:   CPX/v70.0 - Patient has been counseled on age-appropriate routine health concerns for screening and prevention. These are reviewed and up-to-date. Immunizations are up-to-date or declined. Labs and ECG reviewed. appt with GI this week - needs to schedule colo  Problem List Items Addressed This Visit   HYPERTENSION      BP Readings from Last 3 Encounters:  01/19/14 112/72  11/07/13 122/82  08/27/13 120/82  exacerbated by stress - currently well controlled The current medical regimen is effective;  continue present plan and medications.     Relevant Orders      EKG 12-Lead (Completed)   OBESITY, TRUNCAL       Wt Readings from Last 3 Encounters:  01/19/14 225 lb 6.4 oz (102.241 kg)  07/22/13 222 lb 12.8 oz (101.061 kg)  06/27/13 222 lb 6.4 oz (100.88 kg)  The patient is asked to make an attempt to improve diet and exercise patterns to aid in medical management of this problem.  Weight trend reviewed  Will use Phentermine -we reviewed potential risk/benefit and possible side effects - pt understands and agrees to same  Nurse visit in 1 month before any refill provided    Relevant Medications      phentermine (ADIPEX-P) 37.5 MG tablet    Other Visit Diagnoses   Routine general medical examination at a health care facility    -  Primary

## 2014-01-20 ENCOUNTER — Telehealth: Payer: Self-pay | Admitting: Internal Medicine

## 2014-01-20 NOTE — Telephone Encounter (Signed)
Relevant patient education assigned to patient using Emmi. ° °

## 2014-01-21 ENCOUNTER — Telehealth: Payer: Self-pay | Admitting: *Deleted

## 2014-01-21 ENCOUNTER — Encounter: Payer: Self-pay | Admitting: Internal Medicine

## 2014-01-21 ENCOUNTER — Ambulatory Visit (INDEPENDENT_AMBULATORY_CARE_PROVIDER_SITE_OTHER): Payer: BC Managed Care – PPO | Admitting: Internal Medicine

## 2014-01-21 VITALS — BP 122/80 | HR 80 | Ht 65.75 in | Wt 223.0 lb

## 2014-01-21 DIAGNOSIS — Z86010 Personal history of colonic polyps: Secondary | ICD-10-CM

## 2014-01-21 DIAGNOSIS — R141 Gas pain: Secondary | ICD-10-CM

## 2014-01-21 DIAGNOSIS — R143 Flatulence: Principal | ICD-10-CM

## 2014-01-21 DIAGNOSIS — R142 Eructation: Principal | ICD-10-CM

## 2014-01-21 DIAGNOSIS — R1084 Generalized abdominal pain: Secondary | ICD-10-CM

## 2014-01-21 MED ORDER — PHENTERMINE HCL 37.5 MG PO TABS: 37.5000 mg | ORAL_TABLET | Freq: Every day | ORAL | Status: DC

## 2014-01-21 MED ORDER — MOVIPREP 100 G PO SOLR: 1.0000 | Freq: Once | ORAL | Status: DC

## 2014-01-21 NOTE — Telephone Encounter (Signed)
Pt states she has lost the phentermine rx that was given on yesterday. Wanting to know can md give her another prescription...Johny Chess

## 2014-01-21 NOTE — Progress Notes (Signed)
HISTORY OF PRESENT ILLNESS:  [name] [name] is a 53 y.o. female who presents herself today regarding increased abdominal girth. She mentioned this to her PCP who arranged GI consultation. She has noticed this for one year. There is no associated pain or other associated symptoms. She has gained about 10-15 pounds over the past several years. She was last seen December 2010 regarding GERD. 13 pound weight gain since that time. In 2007 she underwent colonoscopy. Adenomatous colon polyp removed. Followup in 5 years recommended. She did receive recall letter at the appropriate time but did not respond. Blood work from 01/14/2014 was unremarkable including comprehensive metabolic panel and CBC with differential. Normal thyroid testing. She is accompanied today by her relative  REVIEW OF SYSTEMS:  All non-GI ROS negative except for sinus and allergy trouble  Past Medical History  Diagnosis Date  . METABOLIC SYNDROME X   . OBESITY, TRUNCAL   . CONSTIPATION, CHRONIC   . KELOID   . Multiple sclerosis   . Allergic rhinitis due to other allergen   . HYPERTENSION   . HYPERLIPIDEMIA   . GERD   . Angioedema     ACEI  . Colon polyps     adenomatous  . Migraine headache     Past Surgical History  Procedure Laterality Date  . Tubal ligation      Social History [name] [name]  reports that she has never smoked. She has never used smokeless tobacco. She reports that she does not drink alcohol or use illicit drugs.  family history includes Coronary artery disease in her mother; Diabetes in her mother and other; Heart disease in her mother; Hyperlipidemia in her other; Hypertension in her other.  Allergies  Allergen Reactions  . Ace Inhibitors     REACTION: Angioedema (tongue swelling)  . Clarithromycin   . Levaquin [Levofloxacin] Other (See Comments)    Tendon pain - shoulder and calf       PHYSICAL EXAMINATION: Vital signs: BP 122/80  Pulse 80  Ht 5' 5.75" (1.67 m)  Wt 223 lb  (101.152 kg)  BMI 36.27 kg/m2  LMP 11/01/2012  Constitutional: generally well-appearing, obese, no acute distress Psychiatric: alert and oriented x3, cooperative Eyes: extraocular movements intact, anicteric, conjunctiva pink Mouth: oral pharynx moist, no lesions Neck: supple no lymphadenopathy Cardiovascular: heart regular rate and rhythm, no murmur Lungs: clear to auscultation bilaterally Abdomen: soft, obese, nontender, nondistended, no obvious ascites, no peritoneal signs, normal bowel sounds, no organomegaly Rectal: Deferred until colonoscopy Extremities: no lower extremity edema bilaterally Skin: no lesions on visible extremities Neuro: No focal deficits. No asterixis.    ASSESSMENT:  #1. Increased abdominal girth. Likely truncal obesity #2. History of adenomatous colon polyps. Overdue for surveillance   PLAN:  #1. Abdominal ultrasound rule out occult fluid or mass #2. If ultrasound negative, then diet and exercise. Also recommended that she attend bariatric seminar which may provide her with multiple weight reduction alternatives #3. Surveillance colonoscopy.The nature of the procedure, as well as the risks, benefits, and alternatives were carefully and thoroughly reviewed with the patient. Ample time for discussion and questions allowed. The patient understood, was satisfied, and agreed to proceed. Movi prep prescribed. Patient instructed on its use.

## 2014-01-21 NOTE — Patient Instructions (Signed)
You have been scheduled for a colonoscopy with propofol. Please follow written instructions given to you at your visit today.  Please pick up your prep kit at the pharmacy within the next 1-3 days. If you use inhalers (even only as needed), please bring them with you on the day of your procedure.   You have been scheduled for an abdominal ultrasound at [facility] (1st floor of hospital) on 01/23/2014 at 8:30am. Please arrive 15 minutes prior to your appointment for registration. Make certain not to have anything to eat or drink 6 hours prior to your appointment. Should you need to reschedule your appointment, please contact radiology at [phone]. This test typically takes about 30 minutes to perform.

## 2014-01-22 ENCOUNTER — Encounter: Payer: Self-pay | Admitting: Internal Medicine

## 2014-01-22 NOTE — Telephone Encounter (Signed)
Yes Done 01/21/14

## 2014-01-22 NOTE — Telephone Encounter (Signed)
Please call pharmacy and authorize/request prenatal MVI to be changed to generic form thanks

## 2014-01-22 NOTE — Telephone Encounter (Signed)
I called and spoke to the pharmacy and gave the okay to change to generic

## 2014-01-23 ENCOUNTER — Ambulatory Visit (HOSPITAL_COMMUNITY)
Admission: RE | Admit: 2014-01-23 | Discharge: 2014-01-23 | Disposition: A | Discharge status: Home / Self Care | Payer: BC Managed Care – PPO | Source: Ambulatory Visit | Attending: Internal Medicine | Admitting: Internal Medicine

## 2014-01-23 DIAGNOSIS — R1084 Generalized abdominal pain: Secondary | ICD-10-CM

## 2014-01-23 DIAGNOSIS — R109 Unspecified abdominal pain: Secondary | ICD-10-CM | POA: Insufficient documentation

## 2014-01-23 DIAGNOSIS — R142 Eructation: Principal | ICD-10-CM

## 2014-01-23 DIAGNOSIS — Z860100 Personal history of colon polyps, unspecified: Secondary | ICD-10-CM | POA: Insufficient documentation

## 2014-01-23 DIAGNOSIS — R143 Flatulence: Principal | ICD-10-CM

## 2014-01-23 DIAGNOSIS — R141 Gas pain: Secondary | ICD-10-CM | POA: Insufficient documentation

## 2014-01-26 ENCOUNTER — Encounter: Payer: Self-pay | Admitting: Nurse Practitioner

## 2014-01-29 ENCOUNTER — Encounter: Payer: Self-pay | Admitting: Internal Medicine

## 2014-02-02 ENCOUNTER — Telehealth: Payer: Self-pay | Admitting: Internal Medicine

## 2014-02-02 NOTE — Telephone Encounter (Signed)
No charge. 

## 2014-02-04 ENCOUNTER — Encounter: Payer: Self-pay | Admitting: Internal Medicine

## 2014-02-19 ENCOUNTER — Ambulatory Visit (INDEPENDENT_AMBULATORY_CARE_PROVIDER_SITE_OTHER): Payer: BC Managed Care – PPO

## 2014-02-19 ENCOUNTER — Ambulatory Visit: Payer: BC Managed Care – PPO

## 2014-02-19 VITALS — Wt 220.0 lb

## 2014-02-19 DIAGNOSIS — E669 Obesity, unspecified: Secondary | ICD-10-CM

## 2014-02-19 MED ORDER — PHENTERMINE HCL 37.5 MG PO TABS: 37.5000 mg | ORAL_TABLET | Freq: Every day | ORAL | Status: DC

## 2014-02-20 NOTE — Progress Notes (Signed)
Weight and BP check as per protocol for adipex refill - reviewed rx covered by my partner in my abscence

## 2014-02-27 ENCOUNTER — Encounter: Payer: Self-pay | Admitting: Internal Medicine

## 2014-03-02 ENCOUNTER — Ambulatory Visit: Payer: BC Managed Care – PPO | Admitting: Nurse Practitioner

## 2014-03-17 ENCOUNTER — Other Ambulatory Visit: Payer: Self-pay | Admitting: Neurology

## 2014-03-23 ENCOUNTER — Encounter: Payer: Self-pay | Admitting: Internal Medicine

## 2014-04-01 ENCOUNTER — Encounter: Payer: Self-pay | Admitting: Internal Medicine

## 2014-04-21 ENCOUNTER — Encounter: Payer: Self-pay | Admitting: Internal Medicine

## 2014-04-21 ENCOUNTER — Ambulatory Visit (INDEPENDENT_AMBULATORY_CARE_PROVIDER_SITE_OTHER): Payer: BC Managed Care – PPO | Admitting: Internal Medicine

## 2014-04-21 VITALS — BP 132/70 | HR 83 | Temp 98.1°F | Ht 65.75 in | Wt 218.0 lb

## 2014-04-21 DIAGNOSIS — E65 Localized adiposity: Secondary | ICD-10-CM

## 2014-04-21 DIAGNOSIS — I10 Essential (primary) hypertension: Secondary | ICD-10-CM

## 2014-04-21 DIAGNOSIS — F411 Generalized anxiety disorder: Secondary | ICD-10-CM

## 2014-04-21 DIAGNOSIS — F419 Anxiety disorder, unspecified: Secondary | ICD-10-CM

## 2014-04-21 MED ORDER — ALPRAZOLAM 0.25 MG PO TABS: 0.2500 mg | ORAL_TABLET | Freq: Three times a day (TID) | ORAL | Status: DC | PRN

## 2014-04-21 NOTE — Progress Notes (Signed)
Subjective:    Patient ID: [name] [name], female    DOB: Sep 29, 1960, 53 y.o.   MRN: [id number]  HPI  Patient is here for follow up  Reviewed chronic medical issues and interval medical events  Past Medical History  Diagnosis Date  . METABOLIC SYNDROME X   . OBESITY, TRUNCAL   . CONSTIPATION, CHRONIC   . KELOID   . Multiple sclerosis   . Allergic rhinitis due to other allergen   . HYPERTENSION   . HYPERLIPIDEMIA   . GERD   . Angioedema     ACEI  . Colon polyps     adenomatous  . Migraine headache     Review of Systems  Constitutional: Negative for fever and unexpected weight change.  Respiratory: Negative for cough and shortness of breath.   Cardiovascular: Negative for chest pain and leg swelling.       Objective:   Physical Exam  BP 132/70  Pulse 83  Temp(Src) 98.1 F (36.7 C) (Oral)  Ht 5' 5.75" (1.67 m)  Wt 218 lb (98.884 kg)  BMI 35.46 kg/m2  SpO2 99%  LMP 11/01/2012 Wt Readings from Last 3 Encounters:  04/21/14 218 lb (98.884 kg)  02/19/14 220 lb (99.791 kg)  01/21/14 223 lb (101.152 kg)   Constitutional: She is obese, and appears well-developed and well-nourished. No distress.  Neck: Normal range of motion. Neck supple. No JVD present. No thyromegaly present.  Cardiovascular: Normal rate, regular rhythm and normal heart sounds.  No murmur heard. No BLE edema. Pulmonary/Chest: Effort normal and breath sounds normal. No respiratory distress. She has no wheezes.  Psychiatric: She has a normal mood and affect. Her behavior is normal. Judgment and thought content normal.   Lab Results  Component Value Date   WBC 8.6 01/14/2014   HGB 13.0 01/14/2014   HCT 38.1 01/14/2014   PLT 299.0 01/14/2014   GLUCOSE 90 01/14/2014   CHOL 196 01/14/2014   TRIG 206.0* 01/14/2014   HDL 45.50 01/14/2014   LDLDIRECT 122.7 11/06/2011   LDLCALC 109* 01/14/2014   ALT 20 01/14/2014   AST 22 01/14/2014   NA 137 01/14/2014   K 3.6 01/14/2014   CL 102 01/14/2014   CREATININE  0.9 01/14/2014   BUN 16 01/14/2014   CO2 27 01/14/2014   TSH 1.86 01/14/2014   HGBA1C 5.9 12/30/2007    US Abdomen Complete  01/23/2014   CLINICAL DATA:  Increasing abdominal girth. . Question mass or occult fluid.  EXAM: ULTRASOUND ABDOMEN COMPLETE  COMPARISON:  None.  FINDINGS: Gallbladder:  No gallstones or wall thickening visualized. No sonographic Murphy sign noted.  Common bile duct:  Diameter: Nondilated at 3 mm diameter.  Liver:  No focal lesion identified. Within normal limits in parenchymal echogenicity.  IVC:  No abnormality visualized.  Pancreas:  Visualized portion unremarkable.  Spleen:  Size and appearance within normal limits.  Right Kidney:  Length: 10.9 cm. Small simple cyst identified towards the upper pole. No solid renal mass or hydronephrosis.  Left Kidney:  Length: 11.0 cm. Echogenicity within normal limits. No mass or hydronephrosis visualized.  Abdominal aorta:  No aneurysm visualized.  Other findings:  No evidence for intraperitoneal free fluid.  IMPRESSION: No sonographic findings to explain the patient's history of increasing abdominal girth.   Electronically Signed   By: Misty Stanley M.D.   On: 01/23/2014 13:02       Assessment & Plan:   Problem List Items Addressed This Visit   Anxiety  Uses low dose xanax as needed Discussed potential need for SSRI if BZ "need" is daily - but doing well at this time Never filled or started low dose sertraline as recommended 04/2013, but not needed at this tim    Relevant Medications      ALPRAZolam  (XANAX) tablet   HYPERTENSION      BP Readings from Last 3 Encounters:  04/21/14 132/70  01/21/14 122/80  01/19/14 112/72  exacerbated by stress - currently well controlled The current medical regimen is effective;  continue present plan and medications.     OBESITY, TRUNCAL - Primary       Wt Readings from Last 3 Encounters:  04/21/14 218 lb (98.884 kg)  02/19/14 220 lb (99.791 kg)  01/21/14 223 lb (101.152 kg)  The  patient is asked to make an attempt to improve diet and exercise patterns to aid in medical management of this problem.  Weight trend reviewed  started Phentermine 01/2014 -taken 3 mo with 5 lbs down Will take 3-68mo break from med and continue lifestyle changes to further weight

## 2014-04-21 NOTE — Assessment & Plan Note (Signed)
Uses low dose xanax as needed Discussed potential need for SSRI if BZ "need" is daily - but doing well at this time Never filled or started low dose sertraline as recommended 04/2013, but not needed at this tim

## 2014-04-21 NOTE — Progress Notes (Signed)
Pre visit review using our clinic review tool, if applicable. No additional management support is needed unless otherwise documented below in the visit note. 

## 2014-04-21 NOTE — Patient Instructions (Signed)
It was good to see you today.  We have reviewed your prior records including labs and tests today  Medications reviewed and updated, no changes recommended at this time. Try OTC multivitamin without iron as discussed  Will hold off on any other weight loss prescription for now but continue to work on lifestyle changes as discussed (low fat, low carb, increased protein diet; improved exercise efforts; weight loss) to control sugar, blood pressure and cholesterol levels and/or reduce risk of developing other medical problems. Continue using [email or web address].[email or web address] or other type of food journal to assist you in this process.  Please schedule followup in 6-12 months, call sooner if problems.

## 2014-04-21 NOTE — Assessment & Plan Note (Signed)
  Wt Readings from Last 3 Encounters:  04/21/14 218 lb (98.884 kg)  02/19/14 220 lb (99.791 kg)  01/21/14 223 lb (101.152 kg)  The patient is asked to make an attempt to improve diet and exercise patterns to aid in medical management of this problem.  Weight trend reviewed  started Phentermine 01/2014 -taken 3 mo with 5 lbs down Will take 3-62mo break from med and continue lifestyle changes to further weight

## 2014-04-21 NOTE — Assessment & Plan Note (Signed)
BP Readings from Last 3 Encounters:  04/21/14 132/70  01/21/14 122/80  01/19/14 112/72  exacerbated by stress - currently well controlled The current medical regimen is effective;  continue present plan and medications.

## 2014-04-24 ENCOUNTER — Ambulatory Visit (INDEPENDENT_AMBULATORY_CARE_PROVIDER_SITE_OTHER): Payer: BC Managed Care – PPO | Admitting: Podiatrist

## 2014-04-24 ENCOUNTER — Encounter: Payer: Self-pay | Admitting: Podiatrist

## 2014-04-24 ENCOUNTER — Ambulatory Visit (INDEPENDENT_AMBULATORY_CARE_PROVIDER_SITE_OTHER): Payer: BC Managed Care – PPO

## 2014-04-24 ENCOUNTER — Encounter: Payer: Self-pay | Admitting: Internal Medicine

## 2014-04-24 VITALS — BP 144/81 | HR 74 | Resp 15 | Ht 63.0 in | Wt 218.0 lb

## 2014-04-24 DIAGNOSIS — M722 Plantar fascial fibromatosis: Secondary | ICD-10-CM

## 2014-04-24 DIAGNOSIS — M7661 Achilles tendinitis, right leg: Secondary | ICD-10-CM

## 2014-04-24 DIAGNOSIS — M7662 Achilles tendinitis, left leg: Secondary | ICD-10-CM

## 2014-04-24 DIAGNOSIS — M7660 Achilles tendinitis, unspecified leg: Secondary | ICD-10-CM

## 2014-04-24 MED ORDER — DICLOFENAC SODIUM 1 % TD GEL: 2.0000 g | Freq: Four times a day (QID) | TRANSDERMAL | Status: DC

## 2014-04-24 NOTE — Patient Instructions (Signed)

## 2014-04-24 NOTE — Progress Notes (Signed)
   Subjective:    Patient ID: [name] [name], female    DOB: December 01, 1960, 53 y.o.   MRN: [id number]  HPI Comments: Pt states her heels began to hurt about a year ago.  Pt states Dr. Asa Lente prescribed Voltaren, but pt states she takes only occasionally, because they give her a headache.  Pt states she feels the episodes may be triggered by her increased walking for exercise.  Foot Pain      Review of Systems  All other systems reviewed and are negative.      Objective:   Physical Exam Patient is awake, alert, and oriented x 3.  In no acute distress.  Vascular status is intact with palpable pedal pulses at 2/4 DP and PT bilateral and capillary refill time within normal limits. Neurological sensation is also intact bilaterally via Semmes Weinstein monofilament at 5/5 sites. Light touch, vibratory sensation, Achilles tendon reflex is intact. Dermatological exam reveals skin color, turger and texture as normal. No open lesions present.  Musculature intact with dorsiflexion, plantarflexion, inversion, eversion.  Pain on the posterior aspect of the left heel is noted consistent with Achilles tendinitis. Mild in nature. Diffuse pain on the plantar medial aspect of the right heel is also present consistent with plantar fasciitis also mild in nature.   Assessment & Plan:  Plantar fasciitis right, Achilles tendinitis left  Plan: Her symptoms today do not ward an injection. She was scanned for orthotics and a prescription for Voltaren gel was in to her pharmacy. I will see her back when the orthotics are ready for pick up.

## 2014-04-26 MED ORDER — AMOXICILLIN 500 MG PO CAPS: 500.0000 mg | ORAL_CAPSULE | Freq: Three times a day (TID) | ORAL | Status: AC

## 2014-04-27 ENCOUNTER — Other Ambulatory Visit: Payer: Self-pay | Admitting: Internal Medicine

## 2014-04-27 ENCOUNTER — Telehealth: Payer: Self-pay | Admitting: *Deleted

## 2014-04-27 NOTE — Telephone Encounter (Signed)
I called the patient  She stated, I was in there to see her on Friday.  She sent in a prescription for Voltaren.  It needs to be approved by my insurance.  Is there anything I need to do?   My foot is hurting and I need it.   I told her no, we will see if we can get it approved and let you know.  She stated okay thank you.

## 2014-04-27 NOTE — Telephone Encounter (Signed)
Hi

## 2014-04-29 ENCOUNTER — Telehealth: Payer: Self-pay | Admitting: *Deleted

## 2014-04-29 NOTE — Telephone Encounter (Signed)
Reach me at this number.

## 2014-04-29 NOTE — Telephone Encounter (Signed)
I sent authorization for Voltaren Gel to the pharmacy.  Spoke to [place] at [employer], approved from 03/30/2014 until 04/29/2015.

## 2014-04-29 NOTE — Telephone Encounter (Signed)
Left msg on trisge requesting call bck. Called pt back she was w\anting samples on her voltaren gel. Inform pt we do not have sample we are no longer getting samples & we didn't have any coupon...Johny Chess

## 2014-04-29 NOTE — Telephone Encounter (Signed)
I called and informed her that I got authorization for the Voltaren Gel.  You should be able to pick that up this afternoon.  She said thank you so much for the help that you have given me.

## 2014-04-30 ENCOUNTER — Telehealth: Payer: Self-pay | Admitting: *Deleted

## 2014-04-30 NOTE — Telephone Encounter (Signed)
I was informed by [name] [name] that the patient had called to see if we have samples of Voltaren Gel.  I called and informed her that we do not carry samples.  She stated that is all she needed to know, thanks for returning my call.

## 2014-05-01 ENCOUNTER — Other Ambulatory Visit: Payer: Self-pay | Admitting: Internal Medicine

## 2014-05-04 ENCOUNTER — Telehealth: Payer: Self-pay | Admitting: Internal Medicine

## 2014-05-04 NOTE — Telephone Encounter (Signed)
Patient called requesting to speak with Lucy. CB# [phone]

## 2014-05-04 NOTE — Telephone Encounter (Signed)
Called pt back she wanted to know did Dr. Asa Lente do cortisone injections. She been having pain in her heel/achilles went to foot Dr & they rx voltaren gel will try gel first if that doesn't help will make appt to gave injection...[name] [name]

## 2014-05-07 ENCOUNTER — Telehealth: Payer: Self-pay | Admitting: Internal Medicine

## 2014-05-07 ENCOUNTER — Other Ambulatory Visit: Payer: Self-pay

## 2014-05-07 MED ORDER — PANTOPRAZOLE SODIUM 40 MG PO TBEC: DELAYED_RELEASE_TABLET | ORAL | Status: DC

## 2014-05-07 MED ORDER — LOSARTAN POTASSIUM 50 MG PO TABS: 50.0000 mg | ORAL_TABLET | Freq: Every day | ORAL | Status: DC

## 2014-05-07 MED ORDER — HYDROCHLOROTHIAZIDE 25 MG PO TABS: ORAL_TABLET | ORAL | Status: DC

## 2014-05-07 MED ORDER — METOPROLOL TARTRATE 50 MG PO TABS: 50.0000 mg | ORAL_TABLET | Freq: Two times a day (BID) | ORAL | Status: DC

## 2014-05-07 NOTE — Telephone Encounter (Signed)
Patient would like a call back to let her know if you received mailorder from her pharmacy in regards to her meds.

## 2014-05-08 NOTE — Telephone Encounter (Signed)
Pt informed that mail order was recvd and that erx was sent back to be filled.

## 2014-05-11 ENCOUNTER — Other Ambulatory Visit: Payer: Self-pay | Admitting: Internal Medicine

## 2014-05-11 ENCOUNTER — Telehealth: Payer: Self-pay | Admitting: Internal Medicine

## 2014-05-11 DIAGNOSIS — E65 Localized adiposity: Secondary | ICD-10-CM

## 2014-05-11 NOTE — Telephone Encounter (Signed)
Ok - refer done as requested thanks

## 2014-05-11 NOTE — Telephone Encounter (Signed)
Pt called requesting referral to a nutrition specialist.

## 2014-05-15 ENCOUNTER — Other Ambulatory Visit: Payer: Self-pay | Admitting: Internal Medicine

## 2014-05-15 ENCOUNTER — Ambulatory Visit: Payer: BC Managed Care – PPO

## 2014-05-15 ENCOUNTER — Telehealth: Payer: Self-pay | Admitting: *Deleted

## 2014-05-15 DIAGNOSIS — M722 Plantar fascial fibromatosis: Secondary | ICD-10-CM

## 2014-05-15 NOTE — Progress Notes (Signed)
PT IS HERE TO PUO

## 2014-05-15 NOTE — Telephone Encounter (Signed)
I called and left her a message that [facility] ([facility]taren Gel 1%)  Apply everyday 4 times a day, 4 refills, 90 day supply.

## 2014-05-15 NOTE — Patient Instructions (Signed)

## 2014-05-19 DIAGNOSIS — Z860101 Personal history of adenomatous and serrated colon polyps: Secondary | ICD-10-CM

## 2014-05-19 DIAGNOSIS — K635 Polyp of colon: Secondary | ICD-10-CM

## 2014-05-19 DIAGNOSIS — Z86010 Personal history of colonic polyps: Secondary | ICD-10-CM

## 2014-05-19 HISTORY — DX: Personal history of colonic polyps: Z86.010

## 2014-05-19 HISTORY — DX: Polyp of colon: K63.5

## 2014-05-19 HISTORY — DX: Personal history of adenomatous and serrated colon polyps: Z86.0101

## 2014-05-22 ENCOUNTER — Encounter: Payer: Self-pay | Admitting: *Deleted

## 2014-05-22 ENCOUNTER — Ambulatory Visit (AMBULATORY_SURGERY_CENTER): Payer: Self-pay | Admitting: *Deleted

## 2014-05-22 ENCOUNTER — Encounter: Payer: BC Managed Care – PPO | Attending: Internal Medicine | Admitting: *Deleted

## 2014-05-22 VITALS — Ht 65.5 in | Wt 218.3 lb

## 2014-05-22 VITALS — Ht 65.0 in | Wt 219.4 lb

## 2014-05-22 DIAGNOSIS — Z6835 Body mass index (BMI) 35.0-35.9, adult: Secondary | ICD-10-CM | POA: Insufficient documentation

## 2014-05-22 DIAGNOSIS — E669 Obesity, unspecified: Secondary | ICD-10-CM | POA: Insufficient documentation

## 2014-05-22 DIAGNOSIS — Z713 Dietary counseling and surveillance: Secondary | ICD-10-CM | POA: Insufficient documentation

## 2014-05-22 DIAGNOSIS — Z86010 Personal history of colonic polyps: Secondary | ICD-10-CM

## 2014-05-22 NOTE — Progress Notes (Signed)
Medical Nutrition Therapy:  Appt start time: 0800 end time:  0900.  Assessment:  Patient here today for weight management. Patient was on phentermine for about a month, but stopped. She is interested in going back on it, as she noticed its impact on her evening snacking. She lost about 5 pounds on the medication, and it maintaining her weight currently. She reports a high motivation to make changes due to strong family history of diabetes and heart disease. She generally eats healthy, however, she does eat out regularly at work, and snacks frequently at night on unhealthy foods despite not being hungry. She was walking regularly, but has been unable to do so recently due to tendonitis and plantar fasciitis.   MEDICATIONS: See list   DIETARY INTAKE:   Usual eating pattern includes 3 meals and 2-3 snacks per day.  24-hr recall:  B (6 AM): Oatmeal, [place] yogurt, sausage/fried bologna OR Honey Nut Cheerios, applesauce, sausage, orange juice with flax oil (16 oz)  Snk (9:30 AM): Popcorn, rice cakes L (12 PM): Chicken thigh, collard greens, applesauce OR [employer] sub, jello, water, sometimes eats out Snk ( PM): None usually D ( PM): Chicken, vegetable, fruit OR eats out Snk ( PM): Chips, popcorn, cookies Beverages: Water, orange juice, diet green tea  Usual physical activity: Was walking 3 miles daily  Estimated energy needs: 1500 calories 188 g carbohydrates 94 g protein 42 g fat  Progress Towards Goal(s):  In progress.   Nutritional Diagnosis:  [place]-3.3 Overweight/obesity As related to excessive snacking in the evening.  As evidenced by BMI >30.    Intervention:  Nutrition counseling. We discussed strategies for weight loss, including balancing nutrients (carbs, protein, fat), portion control, healthy snacks, and exercise.   Goals: 1. 1 pound weight loss per week.  2. Use intuitive eating practices for evening snacking.  3. Patient wants to use the strategy of not eating past 7pm to  control her snacking. However, if she does snack at night, choose healthy snacks (fruit, yogurt, vegetables, popcorn, rice cakes). Patient advised to put note on cupboards to remind her to snack mindfully.  4. Monitor portions at meals, limiting fat intake.  5. Work back up to walking 3 miles most days.   Handouts given during visit include:  Weight loss tips  Meal plan card  Monitoring/Evaluation:  Dietary intake, exercise, and body weight in 1 month(s).

## 2014-05-22 NOTE — Progress Notes (Signed)
No allergies to eggs or soy. No problems with anesthesia.  Pt given Emmi instructions for colonoscopy  No oxygen use  Pt has taken Phentermine but has not taken since June 2015.  Instructed pt not to start taking again until after colonoscopy 9/15.

## 2014-05-28 ENCOUNTER — Encounter: Payer: Self-pay | Admitting: Internal Medicine

## 2014-06-02 ENCOUNTER — Ambulatory Visit (AMBULATORY_SURGERY_CENTER): Payer: BC Managed Care – PPO | Admitting: Internal Medicine

## 2014-06-02 ENCOUNTER — Encounter: Payer: Self-pay | Admitting: Internal Medicine

## 2014-06-02 VITALS — BP 126/75 | HR 79 | Temp 97.4°F | Resp 15 | Ht 65.0 in | Wt 219.0 lb

## 2014-06-02 DIAGNOSIS — Z86010 Personal history of colonic polyps: Secondary | ICD-10-CM

## 2014-06-02 DIAGNOSIS — D123 Benign neoplasm of transverse colon: Secondary | ICD-10-CM

## 2014-06-02 DIAGNOSIS — D126 Benign neoplasm of colon, unspecified: Secondary | ICD-10-CM

## 2014-06-02 MED ORDER — SODIUM CHLORIDE 0.9 % IV SOLN: 500.0000 mL | INTRAVENOUS | Status: DC

## 2014-06-02 NOTE — Op Note (Signed)
[place]  [address] [place], [id number]   COLONOSCOPY PROCEDURE REPORT  PATIENT: [name] [name], [name] [name]  MR#: [id number] BIRTHDATE: March 25, 1961 , 53  yrs. old GENDER: Female ENDOSCOPIST: Eustace Quail, MD REFERRED KR:[id number] Program Recall PROCEDURE DATE:  06/02/2014 PROCEDURE:   Colonoscopy with snare polypectomy x2 First Screening Colonoscopy - Avg.  risk and is 50 yrs.  old or older - No.  Prior Negative Screening - Now for repeat screening. N/A  History of Adenoma - Now for follow-up colonoscopy & has been > or = to 3 yrs.  Yes hx of adenoma.  Has been 3 or more years since last colonoscopy.  Polyps Removed Today? Yes. ASA CLASS:   Class II INDICATIONS:Patient's personal history of adenomatous colon polyps. Index exam July 2007 with diminutive adenoma. MEDICATIONS: MAC sedation, administered by CRNA and propofol (Diprivan) 350mg  IV  DESCRIPTION OF PROCEDURE:   After the risks benefits and alternatives of the procedure were thoroughly explained, informed consent was obtained.  A digital rectal exam revealed no abnormalities of the rectum.   The LB [id number]  endoscope was introduced through the anus and advanced to the cecum, which was identified by both the appendix and ileocecal valve. No adverse events experienced.   The quality of the prep was excellent, using MoviPrep  The instrument was then slowly withdrawn as the colon was fully examined.  COLON FINDINGS: Two polyps, 2mm in size, were found in the transverse colon.  A polypectomy was performed with a cold snare. The resection was complete and the polyp tissue was completely retrieved.   The colon was otherwise normal.  There was no diverticulosis, inflammation, other polyps or cancers unless previously stated.  Retroflexed views revealed no abnormalities. The time to cecum=2 minutes 19 seconds.  Withdrawal time=9 minutes 58 seconds.  The scope was withdrawn and the procedure  completed. COMPLICATIONS: There were no complications.  ENDOSCOPIC IMPRESSION: 1.   Two polyps were found in the transverse colon; polypectomy was performed with a cold snare 2.   The colon was otherwise normal  RECOMMENDATIONS: 1. Repeat colonoscopy in 5 years if polyp adenomatous; otherwise 10 years   eSigned:  Eustace Quail, MD 06/02/2014 10:50 AM   cc: [name], MD and The Patient

## 2014-06-02 NOTE — Patient Instructions (Signed)
YOU HAD AN ENDOSCOPIC PROCEDURE TODAY AT [facility]: Refer to the procedure report that was given to you for any specific questions about what was found during the examination.  If the procedure report does not answer your questions, please call your gastroenterologist to clarify.  If you requested that your care partner not be given the details of your procedure findings, then the procedure report has been included in a sealed envelope for you to review at your convenience later.  YOU SHOULD EXPECT: Some feelings of bloating in the abdomen. Passage of more gas than usual.  Walking can help get rid of the air that was put into your GI tract during the procedure and reduce the bloating. If you had a lower endoscopy (such as a colonoscopy or flexible sigmoidoscopy) you may notice spotting of blood in your stool or on the toilet paper. If you underwent a bowel prep for your procedure, then you may not have a normal bowel movement for a few days.  DIET: Your first meal following the procedure should be a light meal and then it is ok to progress to your normal diet.  A half-sandwich or bowl of soup is an example of a good first meal.  Heavy or fried foods are harder to digest and may make you feel nauseous or bloated.  Likewise meals heavy in dairy and vegetables can cause extra gas to form and this can also increase the bloating.  Drink plenty of fluids but you should avoid alcoholic beverages for 24 hours.  ACTIVITY: Your care partner should take you home directly after the procedure.  You should plan to take it easy, moving slowly for the rest of the day.  You can resume normal activity the day after the procedure however you should NOT DRIVE or use heavy machinery for 24 hours (because of the sedation medicines used during the test).    SYMPTOMS TO REPORT IMMEDIATELY: A gastroenterologist can be reached at any hour.  During normal business hours, 8:30 AM to 5:00 PM Monday through Friday,  call [phone].  After hours and on weekends, please call the GI answering service at [phone] who will take a message and have the physician on call contact you.   Following lower endoscopy (colonoscopy or flexible sigmoidoscopy):  Excessive amounts of blood in the stool  Significant tenderness or worsening of abdominal pains  Swelling of the abdomen that is new, acute  Fever of 100F or higher    FOLLOW UP: If any biopsies were taken you will be contacted by phone or by letter within the next 1-3 weeks.  Call your gastroenterologist if you have not heard about the biopsies in 3 weeks.  Our staff will call the home number listed on your records the next business day following your procedure to check on you and address any questions or concerns that you may have at that time regarding the information given to you following your procedure. This is a courtesy call and so if there is no answer at the home number and we have not heard from you through the emergency physician on call, we will assume that you have returned to your regular daily activities without incident.   Polyp information given.  SIGNATURES/CONFIDENTIALITY: You and/or your care partner have signed paperwork which will be entered into your electronic medical record.  These signatures attest to the fact that that the information above on your After Visit Summary has been reviewed and is understood.  Full responsibility of the confidentiality of this discharge information lies with you and/or your care-partner.

## 2014-06-02 NOTE — Progress Notes (Signed)
Called to room to assist during endoscopic procedure.  Patient ID and intended procedure confirmed with present staff. Received instructions for my participation in the procedure from the performing physician.  

## 2014-06-02 NOTE — Progress Notes (Signed)
Report to PACU, RN, vss, BBS= Clear.  

## 2014-06-03 ENCOUNTER — Telehealth: Payer: Self-pay | Admitting: *Deleted

## 2014-06-03 NOTE — Telephone Encounter (Signed)
Message left

## 2014-06-07 DIAGNOSIS — Z0279 Encounter for issue of other medical certificate: Secondary | ICD-10-CM

## 2014-06-09 ENCOUNTER — Encounter: Payer: Self-pay | Admitting: Internal Medicine

## 2014-06-10 ENCOUNTER — Telehealth: Payer: Self-pay | Admitting: Internal Medicine

## 2014-06-10 NOTE — Telephone Encounter (Signed)
Patient would like to see if she could get forms processed as soon as possible.  She would like a follow up call.

## 2014-06-15 ENCOUNTER — Other Ambulatory Visit: Payer: Self-pay | Admitting: *Deleted

## 2014-06-15 MED ORDER — FLUCONAZOLE 150 MG PO TABS: 150.0000 mg | ORAL_TABLET | Freq: Once | ORAL | Status: AC

## 2014-06-26 ENCOUNTER — Ambulatory Visit: Payer: Self-pay | Admitting: Dietician

## 2014-07-06 ENCOUNTER — Other Ambulatory Visit: Payer: Self-pay | Admitting: *Deleted

## 2014-07-06 MED ORDER — ALPRAZOLAM 0.25 MG PO TABS: 0.2500 mg | ORAL_TABLET | Freq: Three times a day (TID) | ORAL | Status: DC | PRN

## 2014-07-06 MED ORDER — FLUTICASONE PROPIONATE 50 MCG/ACT NA SUSP: NASAL | Status: DC

## 2014-07-06 NOTE — Telephone Encounter (Signed)
[facility] stated [facility] to send request for her flonase & alprazolam. Inform pt per chart have not received, but will send in flonase, but the alprazolam has to be approved by md. Will send request & give her call back tomorrow with status on alprazolam.../lmb

## 2014-07-15 ENCOUNTER — Ambulatory Visit: Payer: BC Managed Care – PPO | Admitting: Nurse Practitioner

## 2014-07-20 ENCOUNTER — Ambulatory Visit: Payer: Self-pay | Admitting: Neurology

## 2014-07-21 ENCOUNTER — Ambulatory Visit (INDEPENDENT_AMBULATORY_CARE_PROVIDER_SITE_OTHER): Payer: BC Managed Care – PPO | Admitting: Internal Medicine

## 2014-07-21 ENCOUNTER — Encounter: Payer: Self-pay | Admitting: Internal Medicine

## 2014-07-21 VITALS — BP 126/76 | HR 67 | Temp 98.0°F | Ht 65.0 in | Wt 224.5 lb

## 2014-07-21 DIAGNOSIS — G35D Multiple sclerosis, unspecified: Secondary | ICD-10-CM

## 2014-07-21 DIAGNOSIS — E65 Localized adiposity: Secondary | ICD-10-CM

## 2014-07-21 DIAGNOSIS — G35 Multiple sclerosis: Secondary | ICD-10-CM

## 2014-07-21 DIAGNOSIS — I10 Essential (primary) hypertension: Secondary | ICD-10-CM

## 2014-07-21 NOTE — Assessment & Plan Note (Signed)
  Wt Readings from Last 3 Encounters:  07/21/14 224 lb 8 oz (101.833 kg)  06/02/14 219 lb (99.338 kg)  05/22/14 219 lb 6.4 oz (99.519 kg)  The patient is asked to make an attempt to improve diet and exercise patterns to aid in medical management of this problem.  Weight trend reviewed  started Phentermine 01/2014 -taken x 3 mo with 5 lbs down Will continue ongoing "break" from med and continue lifestyle changes to further weight

## 2014-07-21 NOTE — Assessment & Plan Note (Signed)
BP Readings from Last 3 Encounters:  07/21/14 126/76  06/02/14 126/75  04/24/14 144/81  exacerbated by stress - currently well controlled The current medical regimen is effective;  continue present plan and medications.

## 2014-07-21 NOTE — Progress Notes (Signed)
Subjective:    Patient ID: [name] [name], female    DOB: Aug 07, 1961, 53 y.o.   MRN: [id number]  HPI  Patient is here for follow up  Reviewed chronic medical issues and interval medical events  Past Medical History  Diagnosis Date  . METABOLIC SYNDROME X   . OBESITY, TRUNCAL   . CONSTIPATION, CHRONIC   . KELOID   . Multiple sclerosis   . Allergic rhinitis due to other allergen   . HYPERTENSION   . HYPERLIPIDEMIA   . GERD   . Angioedema     ACEI  . Colon polyps     adenomatous  . Migraine headache   . Allergy   . Anxiety     Review of Systems  Constitutional: Positive for fatigue. Negative for unexpected weight change.  Respiratory: Negative for cough and shortness of breath.   Gastrointestinal: Positive for constipation (chronic). Negative for abdominal pain and diarrhea.  Neurological: Negative for facial asymmetry, speech difficulty and weakness.       Objective:   Physical Exam  BP 126/76 mmHg  Pulse 67  Temp(Src) 98 F (36.7 C) (Oral)  Ht 5\' 5"  (1.651 m)  Wt 224 lb 8 oz (101.833 kg)  BMI 37.36 kg/m2  SpO2 98%  LMP 11/01/2012 Wt Readings from Last 3 Encounters:  07/21/14 224 lb 8 oz (101.833 kg)  06/02/14 219 lb (99.338 kg)  05/22/14 219 lb 6.4 oz (99.519 kg)   Constitutional: She is MO, appears well-developed and well-nourished. No distress.  Neck: Normal range of motion. Neck supple. No JVD present. No thyromegaly present.  Cardiovascular: Normal rate, regular rhythm and normal heart sounds.  No murmur heard. No BLE edema. Pulmonary/Chest: Effort normal and breath sounds normal. No respiratory distress. She has no wheezes.  Psychiatric: She has a normal mood and affect. Her behavior is normal. Judgment and thought content normal.   Lab Results  Component Value Date   WBC 8.6 01/14/2014   HGB 13.0 01/14/2014   HCT 38.1 01/14/2014   PLT 299.0 01/14/2014   GLUCOSE 90 01/14/2014   CHOL 196 01/14/2014   TRIG 206.0* 01/14/2014   HDL 45.50  01/14/2014   LDLDIRECT 122.7 11/06/2011   LDLCALC 109* 01/14/2014   ALT 20 01/14/2014   AST 22 01/14/2014   NA 137 01/14/2014   K 3.6 01/14/2014   CL 102 01/14/2014   CREATININE 0.9 01/14/2014   BUN 16 01/14/2014   CO2 27 01/14/2014   TSH 1.86 01/14/2014   HGBA1C 5.9 12/30/2007    US Abdomen Complete  01/23/2014   CLINICAL DATA:  Increasing abdominal girth. . Question mass or occult fluid.  EXAM: ULTRASOUND ABDOMEN COMPLETE  COMPARISON:  None.  FINDINGS: Gallbladder:  No gallstones or wall thickening visualized. No sonographic Murphy sign noted.  Common bile duct:  Diameter: Nondilated at 3 mm diameter.  Liver:  No focal lesion identified. Within normal limits in parenchymal echogenicity.  IVC:  No abnormality visualized.  Pancreas:  Visualized portion unremarkable.  Spleen:  Size and appearance within normal limits.  Right Kidney:  Length: 10.9 cm. Small simple cyst identified towards the upper pole. No solid renal mass or hydronephrosis.  Left Kidney:  Length: 11.0 cm. Echogenicity within normal limits. No mass or hydronephrosis visualized.  Abdominal aorta:  No aneurysm visualized.  Other findings:  No evidence for intraperitoneal free fluid.  IMPRESSION: No sonographic findings to explain the patient's history of increasing abdominal girth.   Electronically Signed   By: Misty Stanley  M.D.   On: 01/23/2014 13:02       Assessment & Plan:   Problem List Items Addressed This Visit    Essential hypertension    BP Readings from Last 3 Encounters:  07/21/14 126/76  06/02/14 126/75  04/24/14 144/81  exacerbated by stress - currently well controlled The current medical regimen is effective;  continue present plan and medications.     MULTIPLE SCLEROSIS    Stable without flares for many years until fall 2013, then resumed medical maintenance treatment: copaxone  Mild RLE flare  Early 06/2013: numbness numbness consistent with prior flares per patient -s/p pred taper x 6 days and symptoms  resolved  further eval ongoing by neuro as needed if recurring symptoms  - Dr Hyman Bower @ [facility] in [place], TRUNCAL - Primary     Wt Readings from Last 3 Encounters:  07/21/14 224 lb 8 oz (101.833 kg)  06/02/14 219 lb (99.338 kg)  05/22/14 219 lb 6.4 oz (99.519 kg)  The patient is asked to make an attempt to improve diet and exercise patterns to aid in medical management of this problem.  Weight trend reviewed  started Phentermine 01/2014 -taken x 3 mo with 5 lbs down Will continue ongoing "break" from med and continue lifestyle changes to further weight

## 2014-07-21 NOTE — Patient Instructions (Signed)
It was good to see you today.  We have reviewed your prior records including labs and tests today  Medications reviewed and updated, no changes recommended at this time.  Please schedule followup in 6 months, call sooner if problems.

## 2014-07-21 NOTE — Assessment & Plan Note (Signed)
Stable without flares for many years until fall 2013, then resumed medical maintenance treatment: copaxone  Mild RLE flare  Early 06/2013: numbness numbness consistent with prior flares per patient -s/p pred taper x 6 days and symptoms resolved  further eval ongoing by neuro as needed if recurring symptoms  - Dr Hyman Bower @ [facility] in [facility]

## 2014-07-21 NOTE — Progress Notes (Signed)
Pre visit review using our clinic review tool, if applicable. No additional management support is needed unless otherwise documented below in the visit note. 

## 2014-07-22 ENCOUNTER — Telehealth: Payer: Self-pay | Admitting: Internal Medicine

## 2014-07-22 NOTE — Telephone Encounter (Signed)
emmi emailed °

## 2014-08-05 ENCOUNTER — Telehealth: Payer: Self-pay | Admitting: *Deleted

## 2014-08-05 MED ORDER — LOSARTAN POTASSIUM 50 MG PO TABS: 50.0000 mg | ORAL_TABLET | Freq: Two times a day (BID) | ORAL | Status: DC

## 2014-08-05 NOTE — Telephone Encounter (Signed)
Pt stated her losartan was sent in for her to take once a day. Pt been taking losartan 50 mg twice a day. Per pt chart she is suppose to be taking twice a day. [facility] is [facility] Inform pt will send...Johny Chess

## 2014-08-12 ENCOUNTER — Telehealth: Payer: Self-pay

## 2014-08-12 MED ORDER — LOSARTAN POTASSIUM 50 MG PO TABS: 50.0000 mg | ORAL_TABLET | Freq: Two times a day (BID) | ORAL | Status: DC

## 2014-08-19 NOTE — Telephone Encounter (Signed)
erx done

## 2014-08-26 ENCOUNTER — Telehealth: Payer: Self-pay | Admitting: *Deleted

## 2014-08-26 NOTE — Telephone Encounter (Signed)
Pt is requesting md to give her a call personal matter. If call tomorrow pls call on work # [phone]...[name] [name]

## 2014-08-27 NOTE — Telephone Encounter (Signed)
Notified pt with md response.../lmb 

## 2014-08-27 NOTE — Telephone Encounter (Signed)
Called pt she states she is considering to have the Sleeve surgery done. Wanting md recommendation on this. Would it be ok with her health issue. Also would md have to refer her to someone. If md doesn't recommend to have done what does md recommends...Johny Chess

## 2014-08-27 NOTE — Telephone Encounter (Signed)
I think this bariatric procedure is a good option I recommended pt attend bariatrics seminar at [place] related to same (call hosp to learn day/time of next informational meeting) Once patient proceeds with informational meeting, we will begin the next steps for insurance approval at direction of the bariatric clinic Thanks and happy holidays

## 2014-08-27 NOTE — Telephone Encounter (Signed)
[name] [name] - please call pt on my behalf thanks

## 2014-09-10 ENCOUNTER — Telehealth: Payer: Self-pay | Admitting: *Deleted

## 2014-09-10 ENCOUNTER — Ambulatory Visit (INDEPENDENT_AMBULATORY_CARE_PROVIDER_SITE_OTHER): Payer: BC Managed Care – PPO | Admitting: Internal Medicine

## 2014-09-10 ENCOUNTER — Encounter: Payer: Self-pay | Admitting: Internal Medicine

## 2014-09-10 VITALS — BP 128/70 | HR 86 | Temp 98.5°F | Resp 14 | Ht 65.75 in | Wt 224.0 lb

## 2014-09-10 DIAGNOSIS — R202 Paresthesia of skin: Secondary | ICD-10-CM

## 2014-09-10 DIAGNOSIS — G35 Multiple sclerosis: Secondary | ICD-10-CM

## 2014-09-10 MED ORDER — GABAPENTIN 100 MG PO CAPS: ORAL_CAPSULE | ORAL | Status: DC

## 2014-09-10 NOTE — Progress Notes (Signed)
   Subjective:    Patient ID: [name] [name], female    DOB: Sep 28, 1960, 53 y.o.   MRN: [id number]  HPI  She has noted some numbness in the third and fourth fingers of the right hand using her computer @ work for the last several weeks.  The last 2 days she's noted progressive symptoms of numbness and tingling from the right shoulder area down to the same fingers. As of 12/23 this was as severe as a level X  She took 2 aspirin and Xanax with some benefit  The numbness and tingling symptoms are intermittent. Today these are level three.  She is on Copaxone from Dr. Beacher May for multiple sclerosis. She has a follow-up appointment in January.  Review of Systems   No significant headaches. Mental status change or memory loss denied. Blurred vision , diplopia or vision loss absent. Vertigo, near syncope or imbalance denied. No loss of control of bladder or bowels. Radicular type pain absent. No seizure stigmata.    Objective:   Physical Exam  Gen.: Healthy and well-nourished in appearance. Alert, appropriate and cooperative throughout exam. Appears younger than stated age  Head: Normocephalic without obvious abnormalities  Eyes: No corneal or conjunctival inflammation noted. Pupils equal round reactive to light and accommodation. Extraocular motion intact. FOV WNL Ears: External  ear exam reveals no significant lesions or deformities. Hearing is grossly normal bilaterally. Nose: External nasal exam reveals no deformity or inflammation. Nasal mucosa are pink and moist. No lesions or exudates noted.   Mouth: Oral mucosa and oropharynx reveal no lesions or exudates. Teeth in good repair. Neck: No deformities, masses, or tenderness noted. Range of motion &. Thyroid normal Lungs: Normal respiratory effort; chest expands symmetrically. Lungs are clear to auscultation without rales, wheezes, or increased work of breathing. Heart: Normal rate and rhythm. Normal S1 and S2. No gallop, click,  or rub. No murmur.           Musculoskeletal/extremities: No deformity or scoliosis noted of  the thoracic or lumbar spine.  No clubbing, cyanosis, edema, or significant extremity  deformity noted.  Range of motion normal . Tone & strength normal. Hand joints normal Fingernail  health good. Vascular: Carotid, radial artery pulses are full and equal. No bruits present. Neurologic: Alert and oriented x3. Deep tendon reflexes symmetrical and normal. Cranial nerve exam WNL.Light touch equal over UE Gait normal  including heel & toe walking .  Skin: Intact without suspicious lesions or rashes. Lymph: No cervical, axillary lymphadenopathy present. Psych: Mood and affect are normal. Normally interactive                                                                                     Assessment & Plan:  #1 paresthesias of RUE #2 MS See plan. EMG/NCT may be necessary for diagnosis

## 2014-09-10 NOTE — Patient Instructions (Signed)
Assess response to the gabapentin one every 8 hours as needed. If it is partially beneficial, it can be increased up to a total of 3 pills every 8 hours as needed. This increase of 1 pill each dose  should take place over 72 hours at least. 

## 2014-09-10 NOTE — Telephone Encounter (Signed)
[facility] - Client [facility] Patient Name: [name] [name] Gender: Female DOB: 1961-02-06 Age: 53 Y 46 M 21 D Return Phone Number: [phone] (Primary) Address: [address] City/State/Zip: [place] [address] Client [facility] - Client Client Site [place] - Day Physician Gwendolyn Grant Contact Type Call Call Type Triage / Clinical Relationship To Patient Self Return Phone Number [phone] (Primary) Chief Complaint NUMBNESS - sudden on one side of face or body Initial Comment Caller states she is having sharp needle pain and numbness on right arm and leg. PreDisposition Go to Urgent Care/Walk-In Clinic Nurse Assessment Nurse: Markus Daft, RN, Sherre Poot Date/Time Eilene Ghazi Time): 09/10/2014 8:18:19 AM Confirm and document reason for call. If symptomatic, describe symptoms. ---Caller states she is having sharp needle pain and numbness on right arm and leg. Has been gradual onset over last 2 days, and last night unable to sleep with it. MS relapse? Can still feel, but not nearly as bad. Has the patient traveled out of the country within the last 30 days? ---Not Applicable Does the patient require triage? ---Yes Related visit to physician within the last 2 weeks? ---No Does the PT have any chronic conditions? (i.e. diabetes, asthma, etc.) ---Yes List chronic conditions. ---MS, HTN - on meds Did the patient indicate they were pregnant? ---No Guidelines Guideline Title Affirmed Question Affirmed Notes Nurse Date/Time (Eastern Time) Neurologic Deficit [1] Numbness (i.e., loss of sensation) of the face, arm or leg on one side of the body AND [2] gradual onset (e.g., days to weeks) AND [3] present now [place], [place], [facility] 09/10/2014 8:19:12 AM Disp. Time Eilene Ghazi Time) Disposition Final User 09/10/2014 8:16:45 AM Send to Urgent Queue Davis Gourd 09/10/2014 8:22:39  AM See Physician within 4 Hours (or PCP triage) Yes Markus Daft, RN, Sherre Poot PLEASE NOTE: All timestamps contained within this report are represented as [place] Standard Time. CONFIDENTIALTY NOTICE: This fax transmission is intended only for the addressee. It contains information that is legally privileged, confidential or otherwise protected from use or disclosure. If you are not the intended recipient, you are strictly prohibited from reviewing, disclosing, copying using or disseminating any of this information or taking any action in reliance on or regarding this information. If you have received this fax in error, please notify us immediately by telephone so that we can arrange for its return to [place]. Phone: [phone], Toll-Free: [phone], Fax: [phone] Page: 2 of 2 Call Id: [id number] [place] Understands: Yes Disagree/Comply: Comply

## 2014-09-10 NOTE — Progress Notes (Signed)
Pre visit review using our clinic review tool, if applicable. No additional management support is needed unless otherwise documented below in the visit note. 

## 2014-09-16 ENCOUNTER — Other Ambulatory Visit: Payer: Self-pay | Admitting: Internal Medicine

## 2014-09-18 HISTORY — PX: BREAST BIOPSY: SHX20

## 2014-09-18 HISTORY — PX: MASTECTOMY: SHX3

## 2014-09-21 ENCOUNTER — Other Ambulatory Visit: Payer: Self-pay | Admitting: Internal Medicine

## 2014-09-22 ENCOUNTER — Other Ambulatory Visit: Payer: Self-pay | Admitting: Internal Medicine

## 2014-09-25 ENCOUNTER — Telehealth: Payer: Self-pay | Admitting: *Deleted

## 2014-09-25 NOTE — Telephone Encounter (Signed)
Let pt know this could be MS flare or other neuro problem (carpal tunnel, back problem, other) Really needs OV with a provider to sort out potential causes - or neuro Thanks!

## 2014-09-25 NOTE — Telephone Encounter (Signed)
Notified pt with md response.../lmb 

## 2014-09-25 NOTE — Telephone Encounter (Signed)
Pt states her middle & ring finger on her (R) hand has been numb now for 2 wks. Also her (R) upper thigh is also numb she states when she stand up it feels like someone has put fire to her thigh that how much it burns. Pt is aware that md is not in the office, she is wanting md recommendation. Inform pt once md response will return call with md response...Johny Chess

## 2014-09-29 ENCOUNTER — Ambulatory Visit (INDEPENDENT_AMBULATORY_CARE_PROVIDER_SITE_OTHER): Payer: BC Managed Care – PPO | Admitting: Internal Medicine

## 2014-09-29 ENCOUNTER — Encounter: Payer: Self-pay | Admitting: Internal Medicine

## 2014-09-29 VITALS — BP 138/80 | HR 72 | Temp 98.0°F | Ht 65.75 in | Wt 224.1 lb

## 2014-09-29 DIAGNOSIS — G5621 Lesion of ulnar nerve, right upper limb: Secondary | ICD-10-CM

## 2014-09-29 MED ORDER — GABAPENTIN 100 MG PO CAPS: 100.0000 mg | ORAL_CAPSULE | Freq: Three times a day (TID) | ORAL | Status: DC

## 2014-09-29 NOTE — Patient Instructions (Addendum)
It was good to see you today.  We have reviewed your prior records including labs and tests today  Medications reviewed and updated Start gabapentin 100mg  3 times a day for your nerve pain - no other changes recommended at this time..  Your prescription(s) have been [facility]. Please take as directed and contact our office if you believe you are having problem(s) with the medication(s).  Please schedule followup in 3-4 months as planned, call sooner if problems.  Neuropathic Pain We often think that pain has a physical cause. If we get rid of the cause, the pain should go away. Nerves themselves can also cause pain. It is called neuropathic pain, which means nerve abnormality. It may be difficult for the patients who have it and for the treating caregivers. Pain is usually described as acute (short-lived) or chronic (long-lasting). Acute pain is related to the physical sensations caused by an injury. It can last from a few seconds to many weeks, but it usually goes away when normal healing occurs. Chronic pain lasts beyond the typical healing time. With neuropathic pain, the nerve fibers themselves may be damaged or injured. They then send incorrect signals to other pain centers. The pain you feel is real, but the cause is not easy to find.  CAUSES  Chronic pain can result from diseases, such as diabetes and shingles (an infection related to chickenpox), or from trauma, surgery, or amputation. It can also happen without any known injury or disease. The nerves are sending pain messages, even though there is no identifiable cause for such messages.   Other common causes of neuropathy include diabetes, phantom limb pain, or Regional Pain Syndrome (RPS).  As with all forms of chronic back pain, if neuropathy is not correctly treated, there can be a number of associated problems that lead to a downward cycle for the patient. These include depression, sleeplessness,  feelings of fear and anxiety, limited social interaction and inability to do normal daily activities or work.  The most dramatic and mysterious example of neuropathic pain is called "phantom limb syndrome." This occurs when an arm or a leg has been removed because of illness or injury. The brain still gets pain messages from the nerves that originally carried impulses from the missing limb. These nerves now seem to misfire and cause troubling pain.  Neuropathic pain often seems to have no cause. It responds poorly to standard pain treatment. Neuropathic pain can occur after:  Shingles (herpes zoster virus infection).  A lasting burning sensation of the skin, caused usually by injury to a peripheral nerve.  Peripheral neuropathy which is widespread nerve damage, often caused by diabetes or alcoholism.  Phantom limb pain following an amputation.  Facial nerve problems (trigeminal neuralgia).  Multiple sclerosis.  Reflex sympathetic dystrophy.  Pain which comes with cancer and cancer chemotherapy.  Entrapment neuropathy such as when pressure is put on a nerve such as in carpal tunnel syndrome.  Back, leg, and hip problems (sciatica).  Spine or back surgery.  HIV Infection or AIDS where nerves are infected by viruses. Your caregiver can explain items in the above list which may apply to you. SYMPTOMS  Characteristics of neuropathic pain are:  Severe, sharp, electric shock-like, shooting, lightening-like, knife-like.  Pins and needles sensation.  Deep burning, deep cold, or deep ache.  Persistent numbness, tingling, or weakness.  Pain resulting from light touch or other stimulus that would not usually cause pain.  Increased sensitivity to something that would normally  cause pain, such as a pinprick. Pain may persist for months or years following the healing of damaged tissues. When this happens, pain signals no longer sound an alarm about current injuries or injuries about to  happen. Instead, the alarm system itself is not working correctly.  Neuropathic pain may get worse instead of better over time. For some people, it can lead to serious disability. It is important to be aware that severe injury in a limb can occur without a proper, protective pain response.Burns, cuts, and other injuries may go unnoticed. Without proper treatment, these injuries can become infected or lead to further disability. Take any injury seriously, and consult your caregiver for treatment. DIAGNOSIS  When you have a pain with no known cause, your caregiver will probably ask some specific questions:   Do you have any other conditions, such as diabetes, shingles, multiple sclerosis, or HIV infection?  How would you describe your pain? (Neuropathic pain is often described as shooting, stabbing, burning, or searing.)  Is your pain worse at any time of the day? (Neuropathic pain is usually worse at night.)  Does the pain seem to follow a certain physical pathway?  Does the pain come from an area that has missing or injured nerves? (An example would be phantom limb pain.)  Is the pain triggered by minor things such as rubbing against the sheets at night? These questions often help define the type of pain involved. Once your caregiver knows what is happening, treatment can begin. Anticonvulsant, antidepressant drugs, and various pain relievers seem to work in some cases. If another condition, such as diabetes is involved, better management of that disorder may relieve the neuropathic pain.  TREATMENT  Neuropathic pain is frequently long-lasting and tends not to respond to treatment with narcotic type pain medication. It may respond well to other drugs such as antiseizure and antidepressant medications. Usually, neuropathic problems do not completely go away, but partial improvement is often possible with proper treatment. Your caregivers have large numbers of medications available to treat you. Do  not be discouraged if you do not get immediate relief. Sometimes different medications or a combination of medications will be tried before you receive the results you are hoping for. See your caregiver if you have pain that seems to be coming from nowhere and does not go away. Help is available.  SEEK IMMEDIATE MEDICAL CARE IF:   There is a sudden change in the quality of your pain, especially if the change is on only one side of the body.  You notice changes of the skin, such as redness, black or purple discoloration, swelling, or an ulcer.  You cannot move the affected limbs. Document Released: 06/01/2004 Document Revised: 11/27/2011 Document Reviewed: 06/01/2004 [facility] Patient Information 2015 [place], [place]. This information is not intended to replace advice given to you by your health care provider. Make sure you discuss any questions you have with your health care provider.

## 2014-09-29 NOTE — Progress Notes (Signed)
Subjective:    Patient ID: [name] [name], female    DOB: 26-Sep-1960, 54 y.o.   MRN: [id number]  HPI  Patient is here for follow up -continued numbness of right distal arm Also reviewed chronic medical issues and interval medical events  Past Medical History  Diagnosis Date  . METABOLIC SYNDROME X   . OBESITY, TRUNCAL   . CONSTIPATION, CHRONIC   . KELOID   . Multiple sclerosis   . Allergic rhinitis due to other allergen   . HYPERTENSION   . HYPERLIPIDEMIA   . GERD   . Angioedema     ACEI  . Colon polyps 05/2014    adenomatous, colo q 5y  . Migraine headache   . Allergy   . Anxiety     Review of Systems  Constitutional: Negative for fatigue and unexpected weight change.  Respiratory: Negative for cough and shortness of breath.   Cardiovascular: Negative for chest pain and leg swelling.  Neurological: Positive for numbness (R elbow to hand especially third and fourth fingers "pins and needles" neuropathy). Negative for dizziness, facial asymmetry, speech difficulty and weakness.       Objective:   Physical Exam  BP 138/80 mmHg  Pulse 72  Temp(Src) 98 F (36.7 C) (Oral)  Ht 5' 5.75" (1.67 m)  Wt 224 lb 1 oz (101.634 kg)  BMI 36.44 kg/m2  SpO2 98%  LMP 11/01/2012 Wt Readings from Last 3 Encounters:  09/29/14 224 lb 1 oz (101.634 kg)  09/10/14 224 lb (101.606 kg)  07/21/14 224 lb 8 oz (101.833 kg)   Constitutional: She is obese, appears well-developed and well-nourished. No distress.  Neck: Normal range of motion. Neck supple. No JVD present. No thyromegaly present.  Cardiovascular: Normal rate, regular rhythm and normal heart sounds.  No murmur heard. No BLE edema. Pulmonary/Chest: Effort normal and breath sounds normal. No respiratory distress. She has no wheezes.  Mskel: Right upper extremity with full range of motion including flexion and extension of all digits and wrist and elbow. 5/5 strength. Constant neuropathy tingling in ulnar distribution from  elbow to hand  Psychiatric: She has a normal mood and affect. Her behavior is normal. Judgment and thought content normal.   Lab Results  Component Value Date   WBC 8.6 01/14/2014   HGB 13.0 01/14/2014   HCT 38.1 01/14/2014   PLT 299.0 01/14/2014   GLUCOSE 90 01/14/2014   CHOL 196 01/14/2014   TRIG 206.0* 01/14/2014   HDL 45.50 01/14/2014   LDLDIRECT 122.7 11/06/2011   LDLCALC 109* 01/14/2014   ALT 20 01/14/2014   AST 22 01/14/2014   NA 137 01/14/2014   K 3.6 01/14/2014   CL 102 01/14/2014   CREATININE 0.9 01/14/2014   BUN 16 01/14/2014   CO2 27 01/14/2014   TSH 1.86 01/14/2014   HGBA1C 5.9 12/30/2007    US Abdomen Complete  01/23/2014   CLINICAL DATA:  Increasing abdominal girth. . Question mass or occult fluid.  EXAM: ULTRASOUND ABDOMEN COMPLETE  COMPARISON:  None.  FINDINGS: Gallbladder:  No gallstones or wall thickening visualized. No sonographic Murphy sign noted.  Common bile duct:  Diameter: Nondilated at 3 mm diameter.  Liver:  No focal lesion identified. Within normal limits in parenchymal echogenicity.  IVC:  No abnormality visualized.  Pancreas:  Visualized portion unremarkable.  Spleen:  Size and appearance within normal limits.  Right Kidney:  Length: 10.9 cm. Small simple cyst identified towards the upper pole. No solid renal mass or hydronephrosis.  Left  Kidney:  Length: 11.0 cm. Echogenicity within normal limits. No mass or hydronephrosis visualized.  Abdominal aorta:  No aneurysm visualized.  Other findings:  No evidence for intraperitoneal free fluid.  IMPRESSION: No sonographic findings to explain the patient's history of increasing abdominal girth.   Electronically Signed   By: Misty Stanley M.D.   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 Patient will let us know if  symptoms unimproved over next several weeks for reconsideration of imaging as needed.

## 2014-09-29 NOTE — Progress Notes (Signed)
Pre visit review using our clinic review tool, if applicable. No additional management support is needed unless otherwise documented below in the visit note. 

## 2014-10-05 ENCOUNTER — Other Ambulatory Visit: Payer: Self-pay

## 2014-10-05 DIAGNOSIS — Z1231 Encounter for screening mammogram for malignant neoplasm of breast: Secondary | ICD-10-CM

## 2014-10-13 ENCOUNTER — Other Ambulatory Visit: Payer: Self-pay | Admitting: Obstetrics & Gynecology

## 2014-10-13 DIAGNOSIS — N63 Unspecified lump in unspecified breast: Secondary | ICD-10-CM

## 2014-10-13 DIAGNOSIS — C50919 Malignant neoplasm of unspecified site of unspecified female breast: Secondary | ICD-10-CM | POA: Insufficient documentation

## 2014-10-15 ENCOUNTER — Ambulatory Visit
Admission: RE | Admit: 2014-10-15 | Discharge: 2014-10-15 | Disposition: A | Discharge status: Home / Self Care | Payer: BC Managed Care – PPO | Source: Ambulatory Visit | Attending: Obstetrics & Gynecology | Admitting: Obstetrics & Gynecology

## 2014-10-15 ENCOUNTER — Other Ambulatory Visit: Payer: Self-pay | Admitting: Obstetrics & Gynecology

## 2014-10-15 DIAGNOSIS — N63 Unspecified lump in unspecified breast: Secondary | ICD-10-CM

## 2014-10-19 ENCOUNTER — Other Ambulatory Visit: Payer: Self-pay | Admitting: Obstetrics & Gynecology

## 2014-10-19 DIAGNOSIS — R599 Enlarged lymph nodes, unspecified: Secondary | ICD-10-CM

## 2014-10-20 ENCOUNTER — Ambulatory Visit
Admission: RE | Admit: 2014-10-20 | Discharge: 2014-10-20 | Disposition: A | Discharge status: Home / Self Care | Payer: BC Managed Care – PPO | Source: Ambulatory Visit | Attending: Obstetrics & Gynecology | Admitting: Obstetrics & Gynecology

## 2014-10-20 ENCOUNTER — Other Ambulatory Visit: Payer: Self-pay | Admitting: Obstetrics & Gynecology

## 2014-10-20 DIAGNOSIS — N63 Unspecified lump in unspecified breast: Secondary | ICD-10-CM

## 2014-10-20 DIAGNOSIS — R599 Enlarged lymph nodes, unspecified: Secondary | ICD-10-CM

## 2014-10-21 ENCOUNTER — Other Ambulatory Visit: Payer: Self-pay | Admitting: Obstetrics & Gynecology

## 2014-10-21 DIAGNOSIS — C50911 Malignant neoplasm of unspecified site of right female breast: Secondary | ICD-10-CM

## 2014-10-23 ENCOUNTER — Encounter: Payer: Self-pay | Admitting: *Deleted

## 2014-10-23 ENCOUNTER — Telehealth: Payer: Self-pay | Admitting: *Deleted

## 2014-10-23 ENCOUNTER — Other Ambulatory Visit: Payer: Self-pay | Admitting: Obstetrics & Gynecology

## 2014-10-23 DIAGNOSIS — C50911 Malignant neoplasm of unspecified site of right female breast: Secondary | ICD-10-CM

## 2014-10-23 DIAGNOSIS — C50411 Malignant neoplasm of upper-outer quadrant of right female breast: Secondary | ICD-10-CM | POA: Insufficient documentation

## 2014-10-23 HISTORY — DX: Malignant neoplasm of upper-outer quadrant of right female breast: C50.411

## 2014-10-23 NOTE — Telephone Encounter (Signed)
Confirmed [facility] for 10/28/14 at 1200 .  Instructions and contact information given.

## 2014-10-26 ENCOUNTER — Ambulatory Visit
Admission: RE | Admit: 2014-10-26 | Discharge: 2014-10-26 | Disposition: A | Discharge status: Home / Self Care | Payer: BC Managed Care – PPO | Source: Ambulatory Visit | Attending: Obstetrics & Gynecology | Admitting: Obstetrics & Gynecology

## 2014-10-26 DIAGNOSIS — C50911 Malignant neoplasm of unspecified site of right female breast: Secondary | ICD-10-CM

## 2014-10-26 MED ORDER — GADOBENATE DIMEGLUMINE 529 MG/ML IV SOLN
20.0000 mL | Freq: Once | INTRAVENOUS | Status: AC | PRN
  Administered 2014-10-26: 19 mL via INTRAVENOUS

## 2014-10-27 ENCOUNTER — Other Ambulatory Visit: Payer: Self-pay

## 2014-10-27 ENCOUNTER — Other Ambulatory Visit: Payer: Self-pay | Admitting: Obstetrics & Gynecology

## 2014-10-27 DIAGNOSIS — R928 Other abnormal and inconclusive findings on diagnostic imaging of breast: Secondary | ICD-10-CM

## 2014-10-27 NOTE — Progress Notes (Signed)
Radiation Oncology         [phone] ________________________________  Initial outpatient Consultation  Name: [name] [name] MRN: [id number]  Date: 10/28/2014  DOB: July 10, 1961  [id number] Asa Lente, MD  [name], MD   REFERRING PHYSICIAN: [name], MD  DIAGNOSIS:    ICD-9-CM ICD-10-CM   1. 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 female who presented with a palpable right breast mass. She was not quite due for her annual mammogram which she usually has in March.  Mammogram showed increased density in this region. The mass was 2.9 cm on [place].  Biopsy on 10-20-14 of right breast mass showed grade III invasive ductal carcinoma with DCIS, triple negative, Ki 67 71%.  MRI of breasts on 10-26-14 is somewhat complex.  Left breast is unremarkable.  Right breast shows an UOQ 2.5 cm mass at site of bx, and an adjavent 1.4cm mass that enhances as well. There is 6.5cm of non mass like enhancement throughout the UOQ c/w DCIS.  Right axillary LN (where biopsy was also done) shows mild cortical thickening. There is a right subpectoral LN that also shows mild cortical thickening. No internal mammary or left axillary adenopathy identified.  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 She is here with her son and daughter. No prior cancers or radiotherapy.  PATH: 10-20-14  Estrogen Receptor: 0%, NEGATIVE Progesterone Receptor: 0%, NEGATIVE Proliferation Marker Ki67: 71% HER-2/NEU BY CISH - NEGATIVE.  Diagnosis 1. Breast, right, needle core biopsy, mass, 9 o'clock - INVASIVE DUCTAL CARCINOMA, SEE COMMENT. - DUCTAL CARCINOMA IN SITU WITH NECROSIS. - POSITIVE FOR LYMPH-VASCULAR INVASION. 2. Lymph node, needle/core biopsy, right axilla - ONE LYMPH NODE, NEGATIVE FOR  TUMOR (0/1). SEE COMMENT. Microscopic Comment 1. Although grade of tumor is best assessed at resection, with these biopsies, both the invasive and in situ carcinoma are grade III. Breast prognostic studies are pending and will be reported in an addendum. The case is reviewed with Dr. Lyndon Code who concurs. 2. Needle core biopsy does not involve focal involvement by metastatic tumor. (CRR:gt, 10/21/14)   PREVIOUS RADIATION THERAPY: No  PAST MEDICAL HISTORY:  has a past medical history of METABOLIC SYNDROME X; OBESITY, TRUNCAL; CONSTIPATION, CHRONIC; KELOID; Multiple sclerosis; Allergic rhinitis due to other allergen; HYPERTENSION; HYPERLIPIDEMIA; GERD; Angioedema; Colon polyps (05/2014); Migraine headache; Allergy; Anxiety; Breast cancer of upper-outer quadrant of right female breast (10/23/2014); and Hot flashes.    PAST SURGICAL HISTORY: Past Surgical History  Procedure Laterality Date  . Tubal ligation      FAMILY HISTORY: family history includes Coronary artery disease in her mother; Diabetes in her mother and other; Heart disease in her mother; Hyperlipidemia in her other; Hypertension in her other. There is no history of Colon cancer.  SOCIAL HISTORY:  reports that she has never smoked. She has never used smokeless tobacco. She reports that she does not drink alcohol or use illicit drugs.  ALLERGIES: Ace inhibitors; Clarithromycin; and Levaquin  MEDICATIONS:  Current Outpatient Prescriptions  Medication Sig Dispense Refill  . ALPRAZolam (XANAX) 0.25 MG tablet Take 1 tablet (0.25 mg total) by mouth 3 (three) times daily as needed for sleep or anxiety. 30 tablet 1  . azelastine (ASTELIN) 137 MCG/SPRAY nasal spray Place 2 sprays into both nostrils 2 (two) times daily. Use in each  nostril as directed (Patient not taking: Reported on 10/28/2014) 30 mL 3  . COPAXONE 40 MG/ML SOSY INJECT 40 MG UNDER THE SKIN THREE TIMES A WEEK 12 Syringe 0  . diclofenac sodium (VOLTAREN) 1 % GEL Apply 2 g topically  4 (four) times daily. Rub into affected area of foot 2 to 4 times daily (Patient not taking: Reported on 10/28/2014) 100 g 2  . Flaxseed, Linseed, OIL Take by mouth daily.    . fluticasone (FLONASE) 50 MCG/ACT nasal spray INSTILL 2 SPRAYS INTO EACH NOSTRIL ONCE DAILY (Patient not taking: Reported on 10/28/2014) 16 g 3  . gabapentin (NEURONTIN) 100 MG capsule Take 1 capsule (100 mg total) by mouth 3 (three) times daily. 270 capsule 1  . hydrochlorothiazide (HYDRODIURIL) 25 MG tablet TAKE 1 TABLET BY MOUTH EVERY DAY 90 tablet 1  . loratadine-pseudoephedrine (CLARITIN-D 12 HOUR) 5-120 MG per tablet Take 1 tablet by mouth as needed for allergies. 30 tablet 3  . losartan (COZAAR) 50 MG tablet Take 1 tablet (50 mg total) by mouth 2 (two) times daily. 180 tablet 3  . metoprolol (LOPRESSOR) 50 MG tablet Take 1 tablet (50 mg total) by mouth 2 (two) times daily. 180 tablet 1  . pantoprazole (PROTONIX) 40 MG tablet TAKE 1 TABLET BY MOUTH EVERY DAY 90 tablet 1  . WAL-PHED 30 MG tablet TAKE 1 TABLET BY MOUTH EVERY 4 HOURS AS NEEDED FOR CONGESTION (Patient not taking: Reported on 10/28/2014) 48 tablet 0  . [DISCONTINUED] mometasone (NASONEX) 50 MCG/ACT nasal spray Place 2 sprays into the nose daily. 17 g 2   No current facility-administered medications for this encounter.    REVIEW OF SYSTEMS:  Notable for that above.   PHYSICAL EXAM:   Vitals with Age-Percentiles 10/28/2014  Length 937 cm  Systolic 902  Diastolic 73  Pulse 84  Respiration 19  Weight 99.655 kg  BMI 35.8  VISIT REPORT    General: Alert and oriented, in no acute distress HEENT: Head is normocephalic. Extraocular movements are intact.   Neck: Neck is supple, no palpable cervical or supraclavicular lymphadenopathy. Heart: Regular in rate and rhythm with no murmurs, rubs, or gallops. Chest: Clear to auscultation bilaterally, with no rhonchi, wheezes, or rales. Abdomen: Soft, nontender, nondistended, with no rigidity or  guarding. Extremities: No cyanosis or edema. Lymphatics: see Neck Exam Skin: No concerning lesions. Musculoskeletal: symmetric strength and muscle tone throughout. Neurologic: Cranial nerves II through XII are grossly intact. No obvious focalities. Speech is fluent. Coordination is intact. Psychiatric: Judgment and insight are intact. Affect is appropriate. Breasts: notable for two separate masses in the 9:00 region, right breast.  One is superficial and possibly hematoma.  Deeper mass is larger, approx 2.5 cm in greatest dimension.  LOQ ecchymosis from biopsy of right breast.  Left breast unremarkable. No axillary nodes appreciated b/l.   ECOG = 0  0 - Asymptomatic (Fully active, able to carry on all predisease activities without restriction)  1 - Symptomatic but completely ambulatory (Restricted in physically strenuous activity but ambulatory and able to carry out work of a light or sedentary nature. For example, light housework, office work)  2 - Symptomatic, <50% in bed during the day (Ambulatory and capable of all self care but unable to carry out any work activities. Up and about more than 50% of waking hours)  3 - Symptomatic, >50% in bed, but not bedbound (Capable of only limited self-care, confined to bed or chair 50% or more of waking hours)  4 -  Bedbound (Completely disabled. Cannot carry on any self-care. Totally confined to bed or chair)  5 - Death   Eustace Pen MM, Creech RH, Tormey DC, et al. [phone]). "Toxicity and response criteria of the [facility]". [place] Oncol. 5 (6): 649-55   LABORATORY DATA:  Lab Results  Component Value Date   WBC 7.3 10/28/2014   HGB 13.3 10/28/2014   HCT 40.4 10/28/2014   MCV 85.8 10/28/2014   PLT 313 10/28/2014   CMP     Component Value Date/Time   NA 141 10/28/2014 1229   NA 137 01/14/2014 1206   K 3.7 10/28/2014 1229   K 3.6 01/14/2014 1206   CL 102 01/14/2014 1206   CO2 27 10/28/2014 1229   CO2 27  01/14/2014 1206   GLUCOSE 104 10/28/2014 1229   GLUCOSE 90 01/14/2014 1206   BUN 14.0 10/28/2014 1229   BUN 16 01/14/2014 1206   CREATININE 0.9 10/28/2014 1229   CREATININE 0.9 01/14/2014 1206   CALCIUM 9.5 10/28/2014 1229   CALCIUM 9.5 01/14/2014 1206   PROT 7.4 10/28/2014 1229   PROT 7.5 01/14/2014 1206   ALBUMIN 4.0 10/28/2014 1229   ALBUMIN 4.2 01/14/2014 1206   AST 13 10/28/2014 1229   AST 22 01/14/2014 1206   ALT 19 10/28/2014 1229   ALT 20 01/14/2014 1206   ALKPHOS 99 10/28/2014 1229   ALKPHOS 77 01/14/2014 1206   BILITOT 0.49 10/28/2014 1229   BILITOT 0.6 01/14/2014 1206   GFRNONAA 98.72 09/30/2009 1023   GFRAA 87 12/30/2007 0842         RADIOGRAPHY: Mr Breast Bilateral W Wo Contrast  10/26/2014   CLINICAL DATA:  Patient with recent diagnosis right breast invasive ductal carcinoma and associated ductal carcinoma in situ.  LABS:  BUN and creatinine were obtained on site at [facility] at  [address]  Results:  BUN 20 mg/dL,  Creatinine 0.9 mg/dL.  EXAM: BILATERAL BREAST MRI WITH AND WITHOUT CONTRAST  TECHNIQUE: Multiplanar, multisequence MR images of both breasts were obtained prior to and following the intravenous administration of 19 ml of Multihance.  THREE-DIMENSIONAL MR IMAGE RENDERING ON INDEPENDENT WORKSTATION:  Three-dimensional MR images were rendered by post-processing of the original MR data on an independent workstation. The three-dimensional MR images were interpreted, and findings are reported in the following complete MRI report for this study. Three dimensional images were evaluated at the independent DynaCad workstation  COMPARISON:  Previous exam(s).  FINDINGS: Breast composition: c.  Heterogeneous fibroglandular tissue.  Background parenchymal enhancement: Mild  Right breast: Within the upper-outer right breast middle depth there is a 2.2 x 2.5 x 2.1 cm irregular enhancing mass with central susceptibility artifact. This is compatible with recently  biopsy proven right breast invasive ductal carcinoma.  Approximately 5 mm anterior and superior to this mass is an additional 1.4 x 0.9 x 1.2 cm suspicious irregular enhancing mass.  Superior to the biopsied right breast mass within the upper-outer quadrant of the right breast is approximately 6.5 x 4.8 cm of clumped non mass enhancement which extends from the level of the mass throughout the upper-outer quadrant of the right breast. This is likely associated with suspicious calcifications within the upper-outer right breast.  Left breast: No mass or abnormal enhancement.  Lymph nodes: There is a cortically thickened right axillary lymph node, previously biopsied. Additionally there is a mildly cortically thickened right subpectoral lymph node (image 44; series 4). No internal mammary or left axillary adenopathy identified.  Ancillary findings:  None.  IMPRESSION: 1. Irregular enhancing mass within the upper-outer right breast middle depth compatible with recently biopsied right breast carcinoma. 2. Immediately (5 mm) anterior and superior to this is an additional 1.4 cm suspicious irregular enhancing mass. 3. Extensive suspicious clumped non mass enhancement within the upper-outer quadrant of the right breast, favored to correspond with suspicious calcifications on mammography and concerning for associated DCIS. 4. Mildly cortically thickened right axillary lymph node, previously biopsied. Additionally there is a mildly cortically thickened right subpectoral lymph node.  RECOMMENDATION: 1. If right breast conservation is desired, magnification views of the suspicious calcifications (upper-outer quadrant right breast) and stereotactic guided core needle biopsy of the furthest extent is recommended as these calcifications are favored to correspond with the suspicious non mass enhancement within the upper-outer quadrant of the right breast, potentially representing DCIS. If benign pathology, consider MRI guided core  needle biopsy of the furthest extent of non mass enhancement.  BI-RADS CATEGORY  4: Suspicious.   Electronically Signed   By: Annia Belt M.D.   On: 10/26/2014 15:16   Mm Digital Diagnostic Unilat L  10/26/2014   CLINICAL DATA:  Recent diagnosis of right breast cancer. Left breast evaluation pre MRI.  EXAM: DIGITAL DIAGNOSTIC LEFT MAMMOGRAM WITH CAD  COMPARISON:  11/21/2013 and earlier  ACR Breast Density Category c: The breast tissue is heterogeneously dense, which may obscure small masses.  FINDINGS: No suspicious mass, distortion, or microcalcifications are identified to suggest presence of malignancy.  Mammographic images were processed with CAD.  IMPRESSION: No mammographic evidence for malignancy.  RECOMMENDATION: 1. Treatment plan for known right breast cancer. 2. Diagnostic mammogram recommended in 1 year.  I have discussed the findings and recommendations with the patient. Results were also provided in writing at the conclusion of the visit. If applicable, a reminder letter will be sent to the patient regarding the next appointment.  BI-RADS CATEGORY  1: Negative.   Electronically Signed   By: Rosalie Gums M.D.   On: 10/26/2014 11:28   Mm Digital Diagnostic Unilat R  10/28/2014   ADDENDUM REPORT: 10/28/2014 08:00  ADDENDUM: There are new microcalcifications in the upper-outer quadrant of the right breast spanning an area measuring approximately 8.2 x 5.1 x 4.7 cm.   Electronically Signed   By: Beckie Salts M.D.   On: 10/28/2014 08:00   10/28/2014   CLINICAL DATA:  Patient presents with a palpable abnormality in the lateral right breast.  EXAM: DIGITAL DIAGNOSTIC  RIGHT MAMMOGRAM WITH CAD  ULTRASOUND RIGHT BREAST  COMPARISON:  Prior exams, most recent dated 11/21/2013.  ACR Breast Density Category c: The breast tissue is heterogeneously dense, which may obscure small masses.  FINDINGS: There is a focal irregular opacity in the lateral right breast with associated pleomorphic calcifications both of which  have developed since the prior screening mammogram.  Mammographic images were processed with CAD.  On physical exam, there is a firm non mobile mass in the lateral right breast.  Ultrasound is performed, showing a heterogeneous mostly hypoechoic partly shadowing mass in the right breast at 9 o'clock, 5 cm the nipple, measuring 2.9 cm x 1.5 cm x 1.6 cm. Margins are irregular and partly ill-defined. Next to this mass there is a normal-sized lymph node measuring 4 mm in short axis with normal hilar fat.  In the right axilla there are 3 contiguous level 1 lymph nodes, the largest measuring 11 mm x 7 mm x 7 mm in size. This has no  visible internal fat.  IMPRESSION: New right breast 2.9 cm mass with pleamorphic calcifications highly suspicious for a breast malignancy. Abnormal lymph nodes, the largest measuring 11 mm, are also suspicious for metastatic adenopathy.  RECOMMENDATION: Ultrasound-guided core needle biopsy of the right breast mass as well as the largest right breast lymph node.  I have discussed the findings and recommendations with the patient. Results were also provided in writing at the conclusion of the visit. If applicable, a reminder letter will be sent to the patient regarding the next appointment.  BI-RADS CATEGORY  5: Highly suggestive of malignancy.  Electronically Signed: By: Lajean Manes M.D. On: 10/15/2014 13:20   Mm Digital Diagnostic Unilat R  10/20/2014   CLINICAL DATA:  54 year old female status post ultrasound-guided right breast and right axillary lymph node biopsies  EXAM: DIAGNOSTIC RIGHT MAMMOGRAM POST ULTRASOUND BIOPSY  COMPARISON:  Previous exam(s).  FINDINGS: Mammographic images were obtained following ultrasound guided biopsy of a suspicious right breast mass at 9 o'clock. Post biopsy images demonstrate satisfactory placement of a coil clip within the right breast mass.  IMPRESSION: Satisfactory clip placement post ultrasound-guided right breast biopsy.  Final Assessment: Post  Procedure Mammograms for Marker Placement   Electronically Signed   By: Pamelia Hoit M.D.   On: 10/20/2014 13:45   US Breast Ltd Uni Right Inc Axilla  10/28/2014   ADDENDUM REPORT: 10/28/2014 08:00  ADDENDUM: There are new microcalcifications in the upper-outer quadrant of the right breast spanning an area measuring approximately 8.2 x 5.1 x 4.7 cm.   Electronically Signed   By: Claudie Revering M.D.   On: 10/28/2014 08:00   10/28/2014   CLINICAL DATA:  Patient presents with a palpable abnormality in the lateral right breast.  EXAM: DIGITAL DIAGNOSTIC  RIGHT MAMMOGRAM WITH CAD  ULTRASOUND RIGHT BREAST  COMPARISON:  Prior exams, most recent dated 11/21/2013.  ACR Breast Density Category c: The breast tissue is heterogeneously dense, which may obscure small masses.  FINDINGS: There is a focal irregular opacity in the lateral right breast with associated pleomorphic calcifications both of which have developed since the prior screening mammogram.  Mammographic images were processed with CAD.  On physical exam, there is a firm non mobile mass in the lateral right breast.  Ultrasound is performed, showing a heterogeneous mostly hypoechoic partly shadowing mass in the right breast at 9 o'clock, 5 cm the nipple, measuring 2.9 cm x 1.5 cm x 1.6 cm. Margins are irregular and partly ill-defined. Next to this mass there is a normal-sized lymph node measuring 4 mm in short axis with normal hilar fat.  In the right axilla there are 3 contiguous level 1 lymph nodes, the largest measuring 11 mm x 7 mm x 7 mm in size. This has no visible internal fat.  IMPRESSION: New right breast 2.9 cm mass with pleamorphic calcifications highly suspicious for a breast malignancy. Abnormal lymph nodes, the largest measuring 11 mm, are also suspicious for metastatic adenopathy.  RECOMMENDATION: Ultrasound-guided core needle biopsy of the right breast mass as well as the largest right breast lymph node.  I have discussed the findings and  recommendations with the patient. Results were also provided in writing at the conclusion of the visit. If applicable, a reminder letter will be sent to the patient regarding the next appointment.  BI-RADS CATEGORY  5: Highly suggestive of malignancy.  Electronically Signed: By: Lajean Manes M.D. On: 10/15/2014 13:20   [place] Rt Breast Bx W Loc Dev 1st Lesion Img  Bx Spec US Guide  10/21/2014   ADDENDUM REPORT: 10/21/2014 14:58  ADDENDUM: PATHOLOGY ADDENDUM:  Pathology  Right breast on o'clock location: Invasive ductal carcinoma. Ductal carcinoma in situ with necrosis. Lymphovascular invasion.  Lymph node right axilla:  Negative for tumor.  Pathology concordance with imaging findings: Yes  Recommendation: Consultation regarding treatment plan. The patient is scheduled for Multidisciplinary Clinic on 10/28/2014. MRI scheduled 10/26/2014. At the request of the patient, I spoke with her by telephone on 10/21/2014. She reports doing well after the biopsy . The patient will come to the breast center for educational materials at her convenience.   Electronically Signed   By: Shon Hale M.D.   On: 10/21/2014 14:58   10/21/2014   CLINICAL DATA:  54 year old female for ultrasound-guided right axillary lymph node biopsy  EXAM: ULTRASOUND GUIDED CORE NEEDLE BIOPSY OF A RIGHT AXILLARY NODE  COMPARISON:  Previous exam(s).  FINDINGS: I met with the patient and we discussed the procedure of ultrasound-guided biopsy, including benefits and alternatives. We discussed the high likelihood of a successful procedure. We discussed the risks of the procedure, including infection, bleeding, tissue injury, clip migration, and inadequate sampling. Informed written consent was given. The usual time-out protocol was performed immediately prior to the procedure.  Using sterile technique and 2% Lidocaine as local anesthetic, under direct ultrasound visualization, a 14 gauge spring-loaded device was used to perform biopsy of an enlarged right  axillary lymph node using a lateral to medial approach.  IMPRESSION: Ultrasound guided biopsy of an enlarged right axillary lymph node. No apparent complications.  Electronically Signed: By: Pamelia Hoit M.D. On: 10/20/2014 13:44   [place] Rt Breast Bx W Loc Dev Ea Add Lesion Img Bx Spec US Guide  10/21/2014   ADDENDUM REPORT: 10/21/2014 14:58  ADDENDUM: PATHOLOGY ADDENDUM:  Pathology  Right breast on o'clock location: Invasive ductal carcinoma. Ductal carcinoma in situ with necrosis. Lymphovascular invasion.  Lymph node right axilla:  Negative for tumor.  Pathology concordance with imaging findings: Yes  Recommendation: Consultation regarding treatment plan. The patient is scheduled for Multidisciplinary Clinic on 10/28/2014. MRI scheduled 10/26/2014. At the request of the patient, I spoke with her by telephone on 10/21/2014. She reports doing well after the biopsy . The patient will come to the breast center for educational materials at her convenience.   Electronically Signed   By: Shon Hale M.D.   On: 10/21/2014 14:58   10/21/2014   CLINICAL DATA:  54 year old female for ultrasound-guided biopsy of a suspicious right breast mass  EXAM: ULTRASOUND GUIDED RIGHT BREAST CORE NEEDLE BIOPSY  COMPARISON:  Previous exam(s).  PROCEDURE: I met with the patient and we discussed the procedure of ultrasound-guided biopsy, including benefits and alternatives. We discussed the high likelihood of a successful procedure. We discussed the risks of the procedure including infection, bleeding, tissue injury, clip migration, and inadequate sampling. Informed written consent was given. The usual time-out protocol was performed immediately prior to the procedure.  Using sterile technique and 2% Lidocaine as local anesthetic, under direct ultrasound visualization, a 12 gauge vacuum-assisted device was used to perform biopsy of a suspicious right breast mass at 9 o'clock using a lateral to medial approach. At the conclusion of the  procedure, a coil tissue marker clip was deployed into the biopsy cavity. Follow-up 2-view mammogram was performed and dictated separately.  IMPRESSION: Ultrasound-guided biopsy of a suspicious right breast mass at 9 o'clock. No apparent complications.  Electronically Signed: By: Pamelia Hoit M.D. On:  10/20/2014 13:43      IMPRESSION/PLAN: She has been discussed at our multidisciplinary tumor board. The consensus is to proceed with the following:  Plan is for 1) biopsy of NME in breast (>6cm area) as well as rebiopsy of borderline axillary node 2) PET for staging 3) genetics referral 4) neoadjuvant chemotherapy 5) Surgery 6) Possible adjuvant radiotherapy. Depending on results from the above.  It was a pleasure meeting the patient today. We discussed the risks, benefits, and side effects of radiotherapy. She understands she may not necessarily need this, and we will determine if RT is indicated based on the biopsy results and final pathology. We discussed that radiation would take approximately 6-7 weeks to complete. We spoke about acute effects including skin irritation and fatigue as well as much less common late effects including lung irritation. We spoke about the latest technology that is used to minimize the risk of late effects for breast cancer patients undergoing radiotherapy. No guarantees of treatment were given. I wished her the best as she starts treatment.  __________________________________________   [name], MD

## 2014-10-28 ENCOUNTER — Telehealth: Payer: Self-pay | Admitting: Oncology

## 2014-10-28 ENCOUNTER — Encounter: Payer: Self-pay | Admitting: Oncology

## 2014-10-28 ENCOUNTER — Ambulatory Visit (HOSPITAL_BASED_OUTPATIENT_CLINIC_OR_DEPARTMENT_OTHER): Payer: BC Managed Care – PPO | Admitting: Oncology

## 2014-10-28 ENCOUNTER — Ambulatory Visit: Payer: BC Managed Care – PPO | Attending: General Surgery | Admitting: Physical Therapy

## 2014-10-28 ENCOUNTER — Ambulatory Visit
Admission: RE | Admit: 2014-10-28 | Discharge: 2014-10-28 | Disposition: A | Discharge status: Home / Self Care | Payer: BC Managed Care – PPO | Source: Ambulatory Visit | Attending: Radiation Oncology | Admitting: Radiation Oncology

## 2014-10-28 ENCOUNTER — Other Ambulatory Visit: Payer: Self-pay | Admitting: Obstetrics & Gynecology

## 2014-10-28 ENCOUNTER — Encounter: Payer: Self-pay | Admitting: Physical Therapy

## 2014-10-28 ENCOUNTER — Other Ambulatory Visit (HOSPITAL_BASED_OUTPATIENT_CLINIC_OR_DEPARTMENT_OTHER): Payer: BC Managed Care – PPO

## 2014-10-28 ENCOUNTER — Other Ambulatory Visit (INDEPENDENT_AMBULATORY_CARE_PROVIDER_SITE_OTHER): Payer: Self-pay | Admitting: General Surgery

## 2014-10-28 ENCOUNTER — Ambulatory Visit: Payer: BC Managed Care – PPO

## 2014-10-28 VITALS — BP 140/73 | HR 84 | Temp 98.1°F | Resp 19 | Ht 65.75 in | Wt 219.7 lb

## 2014-10-28 DIAGNOSIS — R293 Abnormal posture: Secondary | ICD-10-CM | POA: Insufficient documentation

## 2014-10-28 DIAGNOSIS — C50411 Malignant neoplasm of upper-outer quadrant of right female breast: Secondary | ICD-10-CM

## 2014-10-28 DIAGNOSIS — C50911 Malignant neoplasm of unspecified site of right female breast: Secondary | ICD-10-CM | POA: Diagnosis not present

## 2014-10-28 DIAGNOSIS — I10 Essential (primary) hypertension: Secondary | ICD-10-CM

## 2014-10-28 DIAGNOSIS — C50811 Malignant neoplasm of overlapping sites of right female breast: Secondary | ICD-10-CM

## 2014-10-28 DIAGNOSIS — Z171 Estrogen receptor negative status [ER-]: Secondary | ICD-10-CM

## 2014-10-28 DIAGNOSIS — G35 Multiple sclerosis: Secondary | ICD-10-CM

## 2014-10-28 DIAGNOSIS — R928 Other abnormal and inconclusive findings on diagnostic imaging of breast: Secondary | ICD-10-CM

## 2014-10-28 DIAGNOSIS — G35D Multiple sclerosis, unspecified: Secondary | ICD-10-CM

## 2014-10-28 LAB — CBC WITH DIFFERENTIAL/PLATELET
BASO%: 0.7 % (ref 0.0–2.0)
Basophils Absolute: 0 10*3/uL (ref 0.0–0.1)
EOS%: 4.5 % (ref 0.0–7.0)
Eosinophils Absolute: 0.3 10*3/uL (ref 0.0–0.5)
HCT: 40.4 % (ref 34.8–46.6)
HGB: 13.3 g/dL (ref 11.6–15.9)
LYMPH#: 3.1 10*3/uL (ref 0.9–3.3)
LYMPH%: 42.5 % (ref 14.0–49.7)
MCH: 28.1 pg (ref 25.1–34.0)
MCHC: 32.8 g/dL (ref 31.5–36.0)
MCV: 85.8 fL (ref 79.5–101.0)
MONO#: 0.5 10*3/uL (ref 0.1–0.9)
MONO%: 6.9 % (ref 0.0–14.0)
NEUT#: 3.3 10*3/uL (ref 1.5–6.5)
NEUT%: 45.4 % (ref 38.4–76.8)
Platelets: 313 10*3/uL (ref 145–400)
RBC: 4.71 10*6/uL (ref 3.70–5.45)
RDW: 15.5 % — AB (ref 11.2–14.5)
WBC: 7.3 10*3/uL (ref 3.9–10.3)

## 2014-10-28 LAB — COMPREHENSIVE METABOLIC PANEL (CC13)
ALBUMIN: 4 g/dL (ref 3.5–5.0)
ALK PHOS: 99 U/L (ref 40–150)
ALT: 19 U/L (ref 0–55)
AST: 13 U/L (ref 5–34)
Anion Gap: 9 mEq/L (ref 3–11)
BUN: 14 mg/dL (ref 7.0–26.0)
CO2: 27 mEq/L (ref 22–29)
Calcium: 9.5 mg/dL (ref 8.4–10.4)
Chloride: 105 mEq/L (ref 98–109)
Creatinine: 0.9 mg/dL (ref 0.6–1.1)
EGFR: >90 mL/min/{1.73_m2} (ref >90)
Glucose: 104 mg/dl (ref 70–140)
POTASSIUM: 3.7 meq/L (ref 3.5–5.1)
SODIUM: 141 meq/L (ref 136–145)
Total Bilirubin: 0.49 mg/dL (ref 0.20–1.20)
Total Protein: 7.4 g/dL (ref 6.4–8.3)

## 2014-10-28 NOTE — Therapy (Signed)
[facility] [address] [place], [place], [address] Phone: [phone]   Fax:  [phone]  Physical Therapy Evaluation  Patient Details  Name: [name] [name] MRN: [id number] Date of Birth: 06/12/1961 Referring Provider:  Emelia Loron, MD  Encounter Date: 10/28/2014      PT End of Session - 10/28/14 1639    Visit Number 1   Number of Visits 1   PT Start Time 1526   PT Stop Time 1555   PT Time Calculation (min) 29 min   Activity Tolerance Patient tolerated treatment well   Behavior During Therapy [facility] for tasks assessed/performed      Past Medical History  Diagnosis Date  . METABOLIC SYNDROME X   . OBESITY, TRUNCAL   . CONSTIPATION, CHRONIC   . KELOID   . Multiple sclerosis   . Allergic rhinitis due to other allergen   . HYPERTENSION   . HYPERLIPIDEMIA   . GERD   . Angioedema     ACEI  . Colon polyps 05/2014    adenomatous, colo q 5y  . Migraine headache   . Allergy   . Anxiety   . Breast cancer of upper-outer quadrant of right female breast 10/23/2014  . Hot flashes     Past Surgical History  Procedure Laterality Date  . Tubal ligation      LMP 11/01/2012  Visit Diagnosis:  Abnormal posture - Plan: PT plan of care cert/re-cert  Breast cancer, right breast - Plan: PT plan of care cert/re-cert      Subjective Assessment - 10/28/14 1632    Symptoms Patient was seen today for a baseline assessment of her newly diagnosed right breast cancer   Pertinent History Patient was diagnosed on 10/21/14 with right Triple negative breast cancer with a Ki67 of 71%.  She has 2 masses found on MRI, one measuring 2.5 cm and the other measuring 1.4 cm.   Patient Stated Goals reduce lymphedema risk and learn post op shoulder ROM HEP   Currently in Pain? No/denies          [facility] PT Assessment - 10/28/14 0001    Assessment   Medical Diagnosis Right breast cancer   Onset Date 10/21/14   Precautions    Precautions Other (comment)  Active breast cancer   Restrictions   Weight Bearing Restrictions No   Balance Screen   Has the patient fallen in the past 6 months No   Has the patient had a decrease in activity level because of a fear of falling?  No   Is the patient reluctant to leave their home because of a fear of falling?  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[employer], computer   Leisure She does not exercise   Cognition   Overall Cognitive Status Within Functional Limits for tasks assessed   Posture/Postural Control   Posture/Postural Control Postural limitations   Postural Limitations Rounded Shoulders;Forward head   AROM   Right Shoulder Extension 65 Degrees   Right Shoulder Flexion 148 Degrees   Right Shoulder ABduction 152 Degrees   Right Shoulder Internal Rotation 62 Degrees   Right Shoulder External Rotation 68 Degrees   Left Shoulder Extension 65 Degrees   Left Shoulder Flexion 147 Degrees  Left Shoulder ABduction 145 Degrees   Left Shoulder Internal Rotation 58 Degrees   Left Shoulder External Rotation 76 Degrees   Strength   Overall Strength Within functional limits for tasks performed           LYMPHEDEMA/ONCOLOGY QUESTIONNAIRE - 10/28/14 1637    Type   Cancer Type Right breast   Lymphedema Assessments   Lymphedema Assessments Upper extremities   Right Upper Extremity Lymphedema   10 cm Proximal to Olecranon Process 35.5 cm   Olecranon Process 29.6 cm   10 cm Proximal to Ulnar Styloid Process 26.4 cm   Just Proximal to Ulnar Styloid Process 19.3 cm   Across Hand at [employer] 20.6 cm   At [place] of 2nd Digit 7.5 cm   Left Upper Extremity Lymphedema   10 cm Proximal to Olecranon Process 35.2 cm    Olecranon Process 29.5 cm   10 cm Proximal to Ulnar Styloid Process 26.2 cm   Just Proximal to Ulnar Styloid Process 18.8 cm   Across Hand at [employer] 20.3 cm   At [place] of 2nd Digit 7.1 cm            PT Education - 10/28/14 1639    Education provided Yes   Education Details Post op shoulder ROM HEP and lymphedema risk reduction   Person(s) Educated Patient;Child(ren)   Methods Explanation;Demonstration;Handout   Comprehension Verbalized understanding       Patient was instructed today in a home exercise program today for post op shoulder range of motion. These included active assist shoulder flexion in sitting, scapular retraction, wall walking with shoulder abduction, and hands behind head external rotation.  She was encouraged to do these twice a day, holding 3 seconds and repeating 5 times when permitted by her physician.           Breast Clinic Goals - 10/28/14 1643    Patient will be able to verbalize understanding of pertinent lymphedema risk reduction practices relevant to her diagnosis specifically related to skin care.   Time 1   Period Days   Status Achieved   Patient will be able to return demonstrate and/or verbalize understanding of the post-op home exercise program related to regaining shoulder range of motion.   Time 1   Period Days   Status Achieved   Patient will be able to verbalize understanding of the importance of attending the postoperative After Breast Cancer Class for further lymphedema risk reduction education and therapeutic exercise.   Time 1   Period Days   Status Achieved              Plan - 10/28/14 1640    Clinical Impression Statement Patient was recently diagnosed with right Triple negative breast cancer.  She is planning to undergo neoadjuvant chemotherapy likely followed by a right mastectomy and axillary node dissection.  This surgical plan may change depending on her response to chemotherapy and whether she is in a  research study.  She will likely benefit from physical therappy post-operatively to regain shoulder ROM and strength to return to her full duties at work.   Pt will benefit from skilled therapeutic intervention in order to improve on the following deficits Decreased range of motion;Increased edema;Decreased knowledge of precautions;Pain;Impaired UE functional use;Decreased strength   Rehab Potential Good   Clinical Impairments Affecting Rehab Potential none   PT Frequency One time visit   PT Treatment/Interventions Patient/family education;Therapeutic exercise   Consulted and Agree with Plan of Care  Patient;Family member/caregiver   Family Member Consulted children       Patient will follow up at outpatient cancer rehab if needed following surgery.  If the patient requires physical therapy at that time, a specific plan will be dictated and sent to the referring physician for approval. The patient was educated today on appropriate basic range of motion exercises to begin post operatively and the importance of attending the After Breast Cancer class following surgery.  Patient was educated today on lymphedema risk reduction practices as it pertains to recommendations that will benefit the patient immediately following surgery.  She verbalized good understanding.  No additional physical therapy is indicated at this time.      Problem List Patient Active Problem List   Diagnosis Date Noted  . Breast cancer of upper-outer quadrant of right female breast 10/23/2014  . Anxiety 04/29/2012  . ALLERGIC RHINITIS 11/29/2009  . 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 CONSTIPATION, CHRONIC 08/17/2008  . HYPERLIPIDEMIA 04/24/2008  . METABOLIC SYNDROME X 89/78/4784  . GERD 01/07/2008  . OBESITY, TRUNCAL 09/20/2007  . Essential hypertension 09/20/2007  . MULTIPLE SCLEROSIS 09/18/2007    Annia Friendly, PT 10/28/2014, 4:46 PM  [place], [place], [address] Phone: [phone]   Fax:  [phone]

## 2014-10-28 NOTE — Telephone Encounter (Signed)
perpof to sch pt appt-Anne to give pt sch

## 2014-10-28 NOTE — Patient Instructions (Signed)

## 2014-10-28 NOTE — Progress Notes (Signed)
[place]  [phone] Fax:([phone]) [phone]     ID: [name] [name] DOB: November 15, 1960  MR#: [id number]  ONG#:[id number]  Patient Care Team: [name], MD as PCP - General Sharene Butters, MD as Consulting Physician (Obstetrics and Gynecology) [name], MD (Gastroenterology) [name]. [name], MD (Neurology) [name], MD as Consulting Physician (General Surgery) [name], MD as Consulting Physician (Oncology) [name], MD as Attending Physician (Radiation Oncology) Roselee Culver, RN as Registered Nurse [place], RN as Registered Nurse OTHER MD:  CHIEF COMPLAINT: Triple negative breast cancer  CURRENT TREATMENT: Neoadjuvant chemotherapy   BREAST CANCER HISTORY: [name] [name] had bilateral screening mammography at the breast Center 11/21/2013. Breast density was category C. Exam was unremarkable. In January 2016 however the patient palpated a change in her right breast laterally. She contacted her gynecologist, Dr. [name], and he set her up for a unilateral right diagnostic mammography with right breast ultrasonography at the [facility] 10/15/2014. There was a focal irregular opacity in the lateral right breast associated with pleomorphic calcifications. This was palpable on exam as a firm non-mobile mass. Ultrasound confirmed a hypoechoic mass in the right breast at the 9:00 position 5 cm from the nipple, measuring 2.9 cm. Next to this mass was a normal-sized intramammary lymph node measuring 4 mm. In the right axilla there were 3 contiguous level I lymph nodes the largest measuring 11 mm.  On 10/20/2014 the patient underwent right core needle biopsy of the 9:00 breast mass, showing ([id number]) and invasive ductal carcinoma, grade 3, triple negative, with the HER-2/neu signals ratio of 1.26 and number per cell 2.15. The MIB-1 was 71%. Biopsy of the largest right axillary lymph node was negative. Dr. Owens Shark the  mammographer who performed a biopsy describes this is concordant.  Left breast mammography 10/26/2014 was negative, and bilateral breast MRI the same day confirmed, in the right breast, and upper outer quadrant mass measuring 2.5 cm, with a second mass measuring 1.4 cm slightly anterior and superior to it. In addition there was a total area of approximately 6.5 cm of non-masslike enhancement extending throughout the upper outer quadrant of the right breast. There was a cortically thickened right axillary lymph node which had been biopsied. There was also a mildly cortically thickened right subpectoral lymph node. There were no internal mammary or left axillary lymph nodes in the left breast was unremarkable.  The patient's subsequent history is as detailed below.  INTERVAL HISTORY: [name] [name] was evaluated in the multidisciplinary breast cancer clinic 10/28/2014 accompanied by her daughter [name] and her son [name]. Her case was also presented at the multidisciplinary breast cancer conference that same day. They're it was suggested that the patient would benefit from neoadjuvant chemotherapy while awaiting results of genetics testing. It was also felt she would benefit from additional biopsies to ascertain the extent of tumor to guide a decision regarding postmastectomy radiation if mastectomy and that up being the surgical choice.  REVIEW OF SYSTEMS: Aside from the breast mass itself, there were no specific symptoms leading to the original mammogram, which was routinely scheduled. The patient denies unusual headaches, visual changes, nausea, vomiting, stiff neck, dizziness, or gait imbalance. There has been no cough, phlegm production, or pleurisy, no chest pain or pressure, and no change in bowel or bladder habits. The patient denies fever, rash, bleeding, unexplained fatigue or unexplained weight loss. She does admit to hot flashes, feeling of numbness over her hands at times, mild sinus allergies  which are  seasonal, and some reflux symptoms. The patient's multiple sclerosis is doing well under the guidance of Dr. [name] at [facility]. A detailed review of systems was otherwise entirely negative.  PAST MEDICAL HISTORY: Past Medical History  Diagnosis Date  . METABOLIC SYNDROME X   . OBESITY, TRUNCAL   . CONSTIPATION, CHRONIC   . KELOID   . Multiple sclerosis   . Allergic rhinitis due to other allergen   . HYPERTENSION   . HYPERLIPIDEMIA   . GERD   . Angioedema     ACEI  . Colon polyps 05/2014    adenomatous, colo q 5y  . Migraine headache   . Allergy   . Anxiety   . Breast cancer of upper-outer quadrant of right female breast 10/23/2014  . Hot flashes     PAST SURGICAL HISTORY: Past Surgical History  Procedure Laterality Date  . Tubal ligation      FAMILY HISTORY Family History  Problem Relation Age of Onset  . Coronary artery disease Mother   . Heart disease Mother   . Diabetes Mother   . Hypertension Other   . Hyperlipidemia Other   . Diabetes Other   . Colon cancer Neg Hx    the patient's father died at the age of 75 following his seizure. The patient's mother died from congestive 61 failure at the age of 9. The patient had 5 brothers and 2 sisters. One brother died from Escherichia coli infection, the other one had a history of seizure disorder. One sister died from causes unknown to the patient. There is no history of breast or ovarian cancer in the family.  GYNECOLOGIC HISTORY:  Patient's last menstrual period was 11/01/2012. Menarche age 46, first live birth age 54. The patient is GX P2. She stopped having periods in May 2014. She did not take hormone replacement. She did take her control pills remotely for approximately 4 years with no complications.  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 Her daughter [name] [name] lives in [place] and works in Warehouse manager for [employer]. Son [name] "[name]" [name] is an Engineer, building services for [employer]: Not in place   HEALTH MAINTENANCE: History  Substance Use Topics  . Smoking status: Never Smoker   . Smokeless tobacco: Never Used     Comment: Single- brother staying with pt since mom's death  . Alcohol Use: No     Colonoscopy: September 2015/[name]  PAP: February 2015  Bone density:  Lipid panel:  Allergies  Allergen Reactions  . Ace Inhibitors Anaphylaxis    REACTION: Angioedema (tongue swelling)  . Clarithromycin Anaphylaxis    Throat swells  . Levaquin [Levofloxacin] Other (See Comments)    Tendon pain - shoulder and calf    Current Outpatient Prescriptions  Medication Sig Dispense Refill  . ALPRAZolam (XANAX) 0.25 MG tablet Take 1 tablet (0.25 mg total) by mouth 3 (three) times daily as needed for sleep or anxiety. 30 tablet 1  . COPAXONE 40 MG/ML SOSY INJECT 40 MG UNDER THE SKIN THREE TIMES A WEEK 12 Syringe 0  . Flaxseed, Linseed, OIL Take by mouth daily.    [name] gabapentin (NEURONTIN) 100 MG capsule Take 1 capsule (100 mg total) by mouth 3 (three) times daily. 270 capsule 1  . hydrochlorothiazide (HYDRODIURIL) 25 MG tablet TAKE 1 TABLET BY MOUTH EVERY DAY 90 tablet 1  .  loratadine-pseudoephedrine (CLARITIN-D 12 HOUR) 5-120 MG per tablet Take 1 tablet by mouth as needed for allergies. 30 tablet 3  . losartan (COZAAR) 50 MG tablet Take 1 tablet (50 mg total) by mouth 2 (two) times daily. 180 tablet 3  . metoprolol (LOPRESSOR) 50 MG tablet Take 1 tablet (50 mg total) by mouth 2 (two) times daily. 180 tablet 1  . pantoprazole (PROTONIX) 40 MG tablet TAKE 1 TABLET BY MOUTH EVERY DAY 90 tablet 1  . azelastine (ASTELIN) 137 MCG/SPRAY nasal spray Place 2 sprays into both nostrils 2 (two) times daily. Use in each nostril as directed (Patient not taking: Reported on 10/28/2014) 30 mL 3  . diclofenac sodium (VOLTAREN) 1 % GEL Apply 2 g topically 4  (four) times daily. Rub into affected area of foot 2 to 4 times daily (Patient not taking: Reported on 10/28/2014) 100 g 2  . fluticasone (FLONASE) 50 MCG/ACT nasal spray INSTILL 2 SPRAYS INTO EACH NOSTRIL ONCE DAILY (Patient not taking: Reported on 10/28/2014) 16 g 3  . WAL-PHED 30 MG tablet TAKE 1 TABLET BY MOUTH EVERY 4 HOURS AS NEEDED FOR CONGESTION (Patient not taking: Reported on 10/28/2014) 48 tablet 0  . [DISCONTINUED] mometasone (NASONEX) 50 MCG/ACT nasal spray Place 2 sprays into the nose daily. 17 g 2   No current facility-administered medications for this visit.    OBJECTIVE: Middle-aged African-American woman in no acute distress Filed Vitals:   10/28/14 1242  BP: 140/73  [name]: 84  Temp: 98.1 F (36.7 C)  Resp: 19     Body mass index is 35.73 kg/(m^2).    ECOG FS:1 - Symptomatic but completely ambulatory  Ocular: Sclerae unicteric, pupils equal, round and reactive  Ear-nose-throat: Oropharynx clear and moist Lymphatic: No cervical or supraclavicular adenopathy Lungs no rales or rhonchi, good excursion bilaterally Heart regular rate and rhythm, no murmur appreciated Abd soft, nontender, positive bowel sounds MSK no focal spinal tenderness, no joint edema Neuro: non-focal, well-oriented, appropriate affect Breasts: The right breast is unremarkable. The left breast is status post recent biopsy. There is a palpable mass in the lateral aspect of the left breast associated with the biopsy site There are no skin or nipple changes of concern. The right axilla is benign.   LAB RESULTS:  CMP     Component Value Date/Time   NA 141 10/28/2014 1229   NA 137 01/14/2014 1206   K 3.7 10/28/2014 1229   K 3.6 01/14/2014 1206   CL 102 01/14/2014 1206   CO2 27 10/28/2014 1229   CO2 27 01/14/2014 1206   GLUCOSE 104 10/28/2014 1229   GLUCOSE 90 01/14/2014 1206   BUN 14.0 10/28/2014 1229   BUN 16 01/14/2014 1206   CREATININE 0.9 10/28/2014 1229   CREATININE 0.9 01/14/2014 1206    CALCIUM 9.5 10/28/2014 1229   CALCIUM 9.5 01/14/2014 1206   PROT 7.4 10/28/2014 1229   PROT 7.5 01/14/2014 1206   ALBUMIN 4.0 10/28/2014 1229   ALBUMIN 4.2 01/14/2014 1206   AST 13 10/28/2014 1229   AST 22 01/14/2014 1206   ALT 19 10/28/2014 1229   ALT 20 01/14/2014 1206   ALKPHOS 99 10/28/2014 1229   ALKPHOS 77 01/14/2014 1206   BILITOT 0.49 10/28/2014 1229   BILITOT 0.6 01/14/2014 1206   GFRNONAA 98.72 09/30/2009 1023   GFRAA 87 12/30/2007 0842    INo results found for: SPEP, UPEP  Lab Results  Component Value Date   WBC 7.3 10/28/2014   NEUTROABS 3.3 10/28/2014  HGB 13.3 10/28/2014   HCT 40.4 10/28/2014   MCV 85.8 10/28/2014   PLT 313 10/28/2014      Chemistry      Component Value Date/Time   NA 141 10/28/2014 1229   NA 137 01/14/2014 1206   K 3.7 10/28/2014 1229   K 3.6 01/14/2014 1206   CL 102 01/14/2014 1206   CO2 27 10/28/2014 1229   CO2 27 01/14/2014 1206   BUN 14.0 10/28/2014 1229   BUN 16 01/14/2014 1206   CREATININE 0.9 10/28/2014 1229   CREATININE 0.9 01/14/2014 1206      Component Value Date/Time   CALCIUM 9.5 10/28/2014 1229   CALCIUM 9.5 01/14/2014 1206   ALKPHOS 99 10/28/2014 1229   ALKPHOS 77 01/14/2014 1206   AST 13 10/28/2014 1229   AST 22 01/14/2014 1206   ALT 19 10/28/2014 1229   ALT 20 01/14/2014 1206   BILITOT 0.49 10/28/2014 1229   BILITOT 0.6 01/14/2014 1206       No results found for: LABCA2  No components found for: LABCA125  No results for input(s): INR in the last 168 hours.  Urinalysis    Component Value Date/Time   COLORURINE YELLOW 01/14/2014 1206   APPEARANCEUR CLEAR 01/14/2014 1206   LABSPEC <=1.005* 01/14/2014 1206   PHURINE 5.5 01/14/2014 1206   GLUCOSEU NEGATIVE 01/14/2014 1206   HGBUR NEGATIVE 01/14/2014 1206   HGBUR negative 08/19/2010 1041   BILIRUBINUR NEGATIVE 01/14/2014 1206   KETONESUR NEGATIVE 01/14/2014 1206   UROBILINOGEN 0.2 01/14/2014 1206   NITRITE NEGATIVE 01/14/2014 1206   LEUKOCYTESUR  NEGATIVE 01/14/2014 1206    STUDIES: Mr Breast Bilateral W Wo Contrast  10/26/2014   CLINICAL DATA:  Patient with recent diagnosis right breast invasive ductal carcinoma and associated ductal carcinoma in situ.  LABS:  BUN and creatinine were obtained on site at [facility] at  [address]  Results:  BUN 20 mg/dL,  Creatinine 0.9 mg/dL.  EXAM: BILATERAL BREAST MRI WITH AND WITHOUT CONTRAST  TECHNIQUE: Multiplanar, multisequence MR images of both breasts were obtained prior to and following the intravenous administration of 19 ml of Multihance.  THREE-DIMENSIONAL MR IMAGE RENDERING ON INDEPENDENT WORKSTATION:  Three-dimensional MR images were rendered by post-processing of the original MR data on an independent workstation. The three-dimensional MR images were interpreted, and findings are reported in the following complete MRI report for this study. Three dimensional images were evaluated at the independent DynaCad workstation  COMPARISON:  Previous exam(s).  FINDINGS: Breast composition: c.  Heterogeneous fibroglandular tissue.  Background parenchymal enhancement: Mild  Right breast: Within the upper-outer right breast middle depth there is a 2.2 x 2.5 x 2.1 cm irregular enhancing mass with central susceptibility artifact. This is compatible with recently biopsy proven right breast invasive ductal carcinoma.  Approximately 5 mm anterior and superior to this mass is an additional 1.4 x 0.9 x 1.2 cm suspicious irregular enhancing mass.  Superior to the biopsied right breast mass within the upper-outer quadrant of the right breast is approximately 6.5 x 4.8 cm of clumped non mass enhancement which extends from the level of the mass throughout the upper-outer quadrant of the right breast. This is likely associated with suspicious calcifications within the upper-outer right breast.  Left breast: No mass or abnormal enhancement.  Lymph nodes: There is a cortically thickened right axillary lymph node,  previously biopsied. Additionally there is a mildly cortically thickened right subpectoral lymph node (image 44; series 4). No internal mammary or left  axillary adenopathy identified.  Ancillary findings:  None.  IMPRESSION: 1. Irregular enhancing mass within the upper-outer right breast middle depth compatible with recently biopsied right breast carcinoma. 2. Immediately (5 mm) anterior and superior to this is an additional 1.4 cm suspicious irregular enhancing mass. 3. Extensive suspicious clumped non mass enhancement within the upper-outer quadrant of the right breast, favored to correspond with suspicious calcifications on mammography and concerning for associated DCIS. 4. Mildly cortically thickened right axillary lymph node, previously biopsied. Additionally there is a mildly cortically thickened right subpectoral lymph node.  RECOMMENDATION: 1. If right breast conservation is desired, magnification views of the suspicious calcifications (upper-outer quadrant right breast) and stereotactic guided core needle biopsy of the furthest extent is recommended as these calcifications are favored to correspond with the suspicious non mass enhancement within the upper-outer quadrant of the right breast, potentially representing DCIS. If benign pathology, consider MRI guided core needle biopsy of the furthest extent of non mass enhancement.  BI-RADS CATEGORY  4: Suspicious.   Electronically Signed   By: Lovey Newcomer M.D.   On: 10/26/2014 15:16   Mm Digital Diagnostic Unilat L  10/26/2014   CLINICAL DATA:  Recent diagnosis of right breast cancer. Left breast evaluation pre MRI.  EXAM: DIGITAL DIAGNOSTIC LEFT MAMMOGRAM WITH CAD  COMPARISON:  11/21/2013 and earlier  ACR Breast Density Category c: The breast tissue is heterogeneously dense, which may obscure small masses.  FINDINGS: No suspicious mass, distortion, or microcalcifications are identified to suggest presence of malignancy.  Mammographic images were processed  with CAD.  IMPRESSION: No mammographic evidence for malignancy.  RECOMMENDATION: 1. Treatment plan for known right breast cancer. 2. Diagnostic mammogram recommended in 1 year.  I have discussed the findings and recommendations with the patient. Results were also provided in writing at the conclusion of the visit. If applicable, a reminder letter will be sent to the patient regarding the next appointment.  BI-RADS CATEGORY  1: Negative.   Electronically Signed   By: Shon Hale M.D.   On: 10/26/2014 11:28   Mm Digital Diagnostic Unilat R  10/28/2014   ADDENDUM REPORT: 10/28/2014 08:00  ADDENDUM: There are new microcalcifications in the upper-outer quadrant of the right breast spanning an area measuring approximately 8.2 x 5.1 x 4.7 cm.   Electronically Signed   By: Claudie Revering M.D.   On: 10/28/2014 08:00   10/28/2014   CLINICAL DATA:  Patient presents with a palpable abnormality in the lateral right breast.  EXAM: DIGITAL DIAGNOSTIC  RIGHT MAMMOGRAM WITH CAD  ULTRASOUND RIGHT BREAST  COMPARISON:  Prior exams, most recent dated 11/21/2013.  ACR Breast Density Category c: The breast tissue is heterogeneously dense, which may obscure small masses.  FINDINGS: There is a focal irregular opacity in the lateral right breast with associated pleomorphic calcifications both of which have developed since the prior screening mammogram.  Mammographic images were processed with CAD.  On physical exam, there is a firm non mobile mass in the lateral right breast.  Ultrasound is performed, showing a heterogeneous mostly hypoechoic partly shadowing mass in the right breast at 9 o'clock, 5 cm the nipple, measuring 2.9 cm x 1.5 cm x 1.6 cm. Margins are irregular and partly ill-defined. Next to this mass there is a normal-sized lymph node measuring 4 mm in short axis with normal hilar fat.  In the right axilla there are 3 contiguous level 1 lymph nodes, the largest measuring 11 mm x 7 mm x 7 mm in size.  This has no visible internal  fat.  IMPRESSION: New right breast 2.9 cm mass with pleamorphic calcifications highly suspicious for a breast malignancy. Abnormal lymph nodes, the largest measuring 11 mm, are also suspicious for metastatic adenopathy.  RECOMMENDATION: Ultrasound-guided core needle biopsy of the right breast mass as well as the largest right breast lymph node.  I have discussed the findings and recommendations with the patient. Results were also provided in writing at the conclusion of the visit. If applicable, a reminder letter will be sent to the patient regarding the next appointment.  BI-RADS CATEGORY  5: Highly suggestive of malignancy.  Electronically Signed: By: Lajean Manes M.D. On: 10/15/2014 13:20   Mm Digital Diagnostic Unilat R  10/20/2014   CLINICAL DATA:  54 year old female status post ultrasound-guided right breast and right axillary lymph node biopsies  EXAM: DIAGNOSTIC RIGHT MAMMOGRAM POST ULTRASOUND BIOPSY  COMPARISON:  Previous exam(s).  FINDINGS: Mammographic images were obtained following ultrasound guided biopsy of a suspicious right breast mass at 9 o'clock. Post biopsy images demonstrate satisfactory placement of a coil clip within the right breast mass.  IMPRESSION: Satisfactory clip placement post ultrasound-guided right breast biopsy.  Final Assessment: Post Procedure Mammograms for Marker Placement   Electronically Signed   By: Pamelia Hoit M.D.   On: 10/20/2014 13:45   US Breast Ltd Uni Right Inc Axilla  10/28/2014   ADDENDUM REPORT: 10/28/2014 08:00  ADDENDUM: There are new microcalcifications in the upper-outer quadrant of the right breast spanning an area measuring approximately 8.2 x 5.1 x 4.7 cm.   Electronically Signed   By: Claudie Revering M.D.   On: 10/28/2014 08:00   10/28/2014   CLINICAL DATA:  Patient presents with a palpable abnormality in the lateral right breast.  EXAM: DIGITAL DIAGNOSTIC  RIGHT MAMMOGRAM WITH CAD  ULTRASOUND RIGHT BREAST  COMPARISON:  Prior exams, most recent dated  11/21/2013.  ACR Breast Density Category c: The breast tissue is heterogeneously dense, which may obscure small masses.  FINDINGS: There is a focal irregular opacity in the lateral right breast with associated pleomorphic calcifications both of which have developed since the prior screening mammogram.  Mammographic images were processed with CAD.  On physical exam, there is a firm non mobile mass in the lateral right breast.  Ultrasound is performed, showing a heterogeneous mostly hypoechoic partly shadowing mass in the right breast at 9 o'clock, 5 cm the nipple, measuring 2.9 cm x 1.5 cm x 1.6 cm. Margins are irregular and partly ill-defined. Next to this mass there is a normal-sized lymph node measuring 4 mm in short axis with normal hilar fat.  In the right axilla there are 3 contiguous level 1 lymph nodes, the largest measuring 11 mm x 7 mm x 7 mm in size. This has no visible internal fat.  IMPRESSION: New right breast 2.9 cm mass with pleamorphic calcifications highly suspicious for a breast malignancy. Abnormal lymph nodes, the largest measuring 11 mm, are also suspicious for metastatic adenopathy.  RECOMMENDATION: Ultrasound-guided core needle biopsy of the right breast mass as well as the largest right breast lymph node.  I have discussed the findings and recommendations with the patient. Results were also provided in writing at the conclusion of the visit. If applicable, a reminder letter will be sent to the patient regarding the next appointment.  BI-RADS CATEGORY  5: Highly suggestive of malignancy.  Electronically Signed: By: Lajean Manes M.D. On: 10/15/2014 13:20   [place] Rt Breast Bx W Loc Dev  1st Lesion Img Bx Spec US Guide  10/21/2014   ADDENDUM REPORT: 10/21/2014 14:58  ADDENDUM: PATHOLOGY ADDENDUM:  Pathology  Right breast on o'clock location: Invasive ductal carcinoma. Ductal carcinoma in situ with necrosis. Lymphovascular invasion.  Lymph node right axilla:  Negative for tumor.  Pathology  concordance with imaging findings: Yes  Recommendation: Consultation regarding treatment plan. The patient is scheduled for Multidisciplinary Clinic on 10/28/2014. MRI scheduled 10/26/2014. At the request of the patient, I spoke with her by telephone on 10/21/2014. She reports doing well after the biopsy . The patient will come to the breast center for educational materials at her convenience.   Electronically Signed   By: Shon Hale M.D.   On: 10/21/2014 14:58   10/21/2014   CLINICAL DATA:  54 year old female for ultrasound-guided right axillary lymph node biopsy  EXAM: ULTRASOUND GUIDED CORE NEEDLE BIOPSY OF A RIGHT AXILLARY NODE  COMPARISON:  Previous exam(s).  FINDINGS: I met with the patient and we discussed the procedure of ultrasound-guided biopsy, including benefits and alternatives. We discussed the high likelihood of a successful procedure. We discussed the risks of the procedure, including infection, bleeding, tissue injury, clip migration, and inadequate sampling. Informed written consent was given. The usual time-out protocol was performed immediately prior to the procedure.  Using sterile technique and 2% Lidocaine as local anesthetic, under direct ultrasound visualization, a 14 gauge spring-loaded device was used to perform biopsy of an enlarged right axillary lymph node using a lateral to medial approach.  IMPRESSION: Ultrasound guided biopsy of an enlarged right axillary lymph node. No apparent complications.  Electronically Signed: By: Pamelia Hoit M.D. On: 10/20/2014 13:44   [place] Rt Breast Bx W Loc Dev Ea Add Lesion Img Bx Spec US Guide  10/21/2014   ADDENDUM REPORT: 10/21/2014 14:58  ADDENDUM: PATHOLOGY ADDENDUM:  Pathology  Right breast on o'clock location: Invasive ductal carcinoma. Ductal carcinoma in situ with necrosis. Lymphovascular invasion.  Lymph node right axilla:  Negative for tumor.  Pathology concordance with imaging findings: Yes  Recommendation: Consultation regarding treatment plan.  The patient is scheduled for Multidisciplinary Clinic on 10/28/2014. MRI scheduled 10/26/2014. At the request of the patient, I spoke with her by telephone on 10/21/2014. She reports doing well after the biopsy . The patient will come to the breast center for educational materials at her convenience.   Electronically Signed   By: Shon Hale M.D.   On: 10/21/2014 14:58   10/21/2014   CLINICAL DATA:  54 year old female for ultrasound-guided biopsy of a suspicious right breast mass  EXAM: ULTRASOUND GUIDED RIGHT BREAST CORE NEEDLE BIOPSY  COMPARISON:  Previous exam(s).  PROCEDURE: I met with the patient and we discussed the procedure of ultrasound-guided biopsy, including benefits and alternatives. We discussed the high likelihood of a successful procedure. We discussed the risks of the procedure including infection, bleeding, tissue injury, clip migration, and inadequate sampling. Informed written consent was given. The usual time-out protocol was performed immediately prior to the procedure.  Using sterile technique and 2% Lidocaine as local anesthetic, under direct ultrasound visualization, a 12 gauge vacuum-assisted device was used to perform biopsy of a suspicious right breast mass at 9 o'clock using a lateral to medial approach. At the conclusion of the procedure, a coil tissue marker clip was deployed into the biopsy cavity. Follow-up 2-view mammogram was performed and dictated separately.  IMPRESSION: Ultrasound-guided biopsy of a suspicious right breast mass at 9 o'clock. No apparent complications.  Electronically Signed: By: Junie Panning  Brigitte [name] M.D. On: 10/20/2014 13:43    ASSESSMENT: 54 y.o. [place] woman status post left breast upper outer quadrant biopsy 10/20/2014 for a clinical T2 N0, stage IIA invasive ductal carcinoma, grade 3, triple negative, with an MIB-1 of 71%  (1) genetics testing pending  2) neoadjuvant chemotherapy to consist of cyclophosphamide and doxorubicin in dose dense fashion 4  given with Neulasta support, followed by weekly carboplatin and paclitaxel 12  (3) definitive surgery to follow chemotherapy  (4) adjuvant radiation, if appropriate, to follow surgery  PLAN:\We spent the better part of today's hour-long appointment discussing the biology of breast cancer in general, and the specifics of the patient's tumor in particular. [name] [name] understands whether she receives chemotherapy first or surgery first does not affect the ultimate outcome. In her case we think she will benefit from neoadjuvant treatment which will maximize her chance of retaining her breast. It will also give her time to get her genetics results. That of course may also affect her ultimate choice of surgery  We discussed the fact that she is not a candidate for anti-estrogens or anti-HER-2 therapy. This means her only choice for systemic treatment is chemotherapy. She will receive 4 cycles of doxorubicin and cyclophosphamide given in dose dense fashion with Neulasta support, followed by weekly paclitaxel and carboplatin 12. She will have a port placed to facilitate treatment and she will have an echocardiogram before the start of therapy to make sure her heart can tolerate it. She will have a repeat breast MRI after the doxorubicin/Adriamycin portion of her treatment and then again after the completion of the rest of the chemotherapy.  We think he would be prudent in her case to repeat a lymph node biopsy since it is not clear whether the lymph node biopsy already obtained was concordance. We will also need to biopsy the more distant part of the abnormal area in the breast. Hopefully with that information in hand we will be able to make a definitive decision regarding whether or not she would benefit from postmastectomy radiation if she ends up with a mastectomy  Because we may be dealing with more than 3 lymph nodes, she may be a stage III, and for that reason I think a PET scan will be helpful.  She will  return to see me a few days before her target start date which is 11/19/2014. At that visit we will discuss anti-nausea and other preventative measures. She will also come to "chemotherapy school" before that visit.  The patient has a good understanding of the overall plan. She agrees with it. She knows the goal of treatment in her case is cure. She will call with any problems that may develop before her next visit here.  [name], MD   10/28/2014 2:52 PM Medical Oncology and Hematology [facility] [address] [place], [place] [address] Tel. [phone]    Fax. [phone]

## 2014-10-28 NOTE — Progress Notes (Signed)
Checked in new pt with no financial concerns prior to seeing the dr.  Informed pt if chemo is part of her treatment Raquel will call her ins to see if auth is req and will obtain it if it is as well as contact foundations that offer copay assistance for chemo if needed.  She has Raquel's card for any billing questions or concerns. °

## 2014-10-29 ENCOUNTER — Telehealth: Payer: Self-pay | Admitting: Oncology

## 2014-10-29 NOTE — Telephone Encounter (Signed)
pt cld & left vm-no details-cld pt back to acknowledge call-adv to c

## 2014-10-30 ENCOUNTER — Ambulatory Visit
Admission: RE | Admit: 2014-10-30 | Discharge: 2014-10-30 | Disposition: A | Discharge status: Home / Self Care | Payer: BC Managed Care – PPO | Source: Ambulatory Visit | Attending: Obstetrics & Gynecology | Admitting: Obstetrics & Gynecology

## 2014-10-30 ENCOUNTER — Encounter: Payer: Self-pay | Admitting: General Practice

## 2014-10-30 ENCOUNTER — Encounter: Payer: Self-pay | Admitting: Oncology

## 2014-10-30 DIAGNOSIS — R928 Other abnormal and inconclusive findings on diagnostic imaging of breast: Secondary | ICD-10-CM

## 2014-10-30 NOTE — Progress Notes (Signed)
I put 2 fmla,1 disability and 1 cancer form on nurse's desk for Dr. Jana Hakim to sign.

## 2014-10-30 NOTE — Progress Notes (Signed)
[place] Psychosocial Distress Screening Spiritual Care  Visited with Ms Beal and her children Antonio and Lavella Lemons in breast clinic, introducing Spiritual Care and Support Services team/resources, and reviewing distress screen per protocol.  The patient scored a 2 on the Psychosocial Distress Thermometer which indicates mild distress. Also assessed for distress and other psychosocial needs.   ONCBCN DISTRESS SCREENING 10/30/2014  Screening Type Initial Screening  Distress experienced in past week (1-10) 2  Physical Problem type Sleep/insomnia  Referral to support programs Yes  Other Spiritual Care, Alight   After speaking with team in clinic, Pt reports no distress.  Per pt, family and church are good sources of support.  Per pt, insomnia is a general complaint, not associated with dx/tx.  She is using hope, prayer, and optimism to cope.  Follow up needed: No. Per pt's permission, will refer to [facility] [place].  Pt and family aware of ongoing chaplain/Support Center team availability and contact information.  Please also page as needs arise.  Thank you.  [place], [place]

## 2014-11-02 ENCOUNTER — Ambulatory Visit (HOSPITAL_BASED_OUTPATIENT_CLINIC_OR_DEPARTMENT_OTHER): Payer: BC Managed Care – PPO | Admitting: Genetic Counselor

## 2014-11-02 ENCOUNTER — Other Ambulatory Visit: Payer: BC Managed Care – PPO

## 2014-11-02 ENCOUNTER — Encounter: Payer: Self-pay | Admitting: Genetic Counselor

## 2014-11-02 DIAGNOSIS — C50411 Malignant neoplasm of upper-outer quadrant of right female breast: Secondary | ICD-10-CM

## 2014-11-02 DIAGNOSIS — Z171 Estrogen receptor negative status [ER-]: Secondary | ICD-10-CM

## 2014-11-02 NOTE — Progress Notes (Signed)
REFERRING PROVIDER: [name], MD [address] [place], [name] [address]  PRIMARY PROVIDER:  Gwendolyn Grant, MD  PRIMARY REASON FOR VISIT:  1. Breast cancer of upper-outer quadrant of right female breast      HISTORY OF PRESENT ILLNESS:   [name] [name], a 54 y.o. female, was seen for a [facility] cancer genetics consultation at the request of Dr. Asa Lente due to a personal history of cancer.  [name] [name] presents to clinic today to discuss the possibility of a hereditary predisposition to cancer, genetic testing, and to further clarify her future cancer risks, as well as potential cancer risks for family members.   In 2016, at the age of 39, [name] [name] was diagnosed with invasive ductal carcinoma of the breast.  The tumor is triple negative. This will be treated with chemotherapy, surgery and radiation.  [name] [name] states that she plans on having a double mastectomy, regardless of the genetic test results.   CANCER HISTORY:    Breast cancer of upper-outer quadrant of right female breast   10/15/2014 Mammogram New right breast 2.9 cm mass with pleamorphic calcifications highly suspicious for a breast malignancy. Abnormal lymph nodes, the largest measuring 11 mm, are also suspicious for metastatic adenopathy.   10/15/2014 Breast US New right breast 2.9 cm mass with pleamorphic calcifications highly suspicious for a breast malignancy. Abnormal lymph nodes, the largest measuring 11 mm, are also suspicious for metastatic adenopathy.   10/20/2014 Initial Biopsy Breast, right, needle core biopsy, mass, 9 o'clock - INVASIVE DUCTAL CARCINOMA, SEE COMMENT. - DUCTAL CARCINOMA IN SITU WITH NECROSIS. - POSITIVE FOR LYMPH-VASCULAR INVASION. 2. Lymph node, needle/core biopsy, right axilla - ONE LYMPH NODE, NEGATIVE    10/20/2014 Receptors her2 Estrogen Receptor: 0%, NEGATIVE Progesterone Receptor: 0%, NEGATIVE Proliferation Marker Ki67: 71%HER-2/NEU BY CISH - NEGATIVE.    10/23/2014 Initial Diagnosis Breast cancer of upper-outer quadrant of right female breast   10/26/2014 Breast MRI Right breast: Within the upper-outer right breast middle depth there is a 2.2 x 2.5 x 2.1 cm irregular enhancing mass with central susceptibility artifact. This is compatible with recently biopsy proven right breast invasive ductal carcinoma.    10/26/2014 Breast MRI Approximately 5 mm anterior and superior to this mass is an additional 1.4 x 0.9 x 1.2 cm suspicious irregular enhancing mass.   10/26/2014 Breast MRI Superior to the biopsied right breast mass within the upper-outer quadrant of the right breast is approximately 6.5 x 4.8 cm of clumped non mass enhancement which extends from the level of the mass throughout the upper-outer quadrant of the right breas     HORMONAL RISK FACTORS:  Menarche was at age 73.  First live birth at age 89.  OCP use for approximately <5  years.  Ovaries intact: yes.  Hysterectomy: no.  Menopausal status: postmenopausal.  HRT use: 0 years. Colonoscopy: yes; several polyps, reportedly "benign". Mammogram within the last year: yes. Number of breast biopsies: 1. Up to date with pelvic exams:  yes. Any excessive radiation exposure in the past:  no  Past Medical History  Diagnosis Date  . METABOLIC SYNDROME X   . OBESITY, TRUNCAL   . CONSTIPATION, CHRONIC   . KELOID   . Multiple sclerosis   . Allergic rhinitis due to other allergen   . HYPERTENSION   . HYPERLIPIDEMIA   . GERD   . Angioedema     ACEI  . Colon polyps 05/2014    adenomatous, colo q  5y  . Migraine headache   . Allergy   . Anxiety   . Breast cancer of upper-outer quadrant of right female breast 10/23/2014  . Hot flashes     Past Surgical History  Procedure Laterality Date  . Tubal ligation      History   Social History  . Marital Status: Single    Spouse Name: N/A  . Number of Children: 2  . Years of Education: 12   Occupational History  . [place] History Main Topics  . Smoking status: Never Smoker   . Smokeless tobacco: Never Used     Comment: Single- brother staying with pt since mom's death  . Alcohol Use: No  . Drug Use: No  . Sexual Activity: Not on file   Other Topics Concern  . 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    Patient is single and lives alone.    Patient is right handed.      FAMILY HISTORY:  We obtained a detailed, 4-generation family history.  Significant diagnoses are listed below: Family History  Problem Relation Age of Onset  . Coronary artery disease Mother   . Heart disease Mother   . Diabetes Mother   . Hypertension Other   . Hyperlipidemia Other   . Diabetes Other   . Colon cancer Neg Hx   . Alcohol abuse Father   . COPD Maternal Grandmother    The patient has one sister and two brothers who are deceased and one sister and three brothers living.  Nobody had cancer.  Her mother died at 62 from CHF and her maternal aunt is alive. There is no cancer on the maternal side of the family.  Her father died in his late 36s, when [name] [name] was 9, from alcohol abuse.  She has no information about his family. Patient's ancestors are of African American descent. There is no reported Ashkenazi Jewish ancestry. There is no known consanguinity.  GENETIC COUNSELING ASSESSMENT: [name] [name] is a 54 y.o. female with a personal history of triple negative breast cancer and a limited paternal family history which somewhat suggestive of a hereditary cancer syndrome and predisposition to cancer. We, therefore, discussed and recommended the following at today's visit.   DISCUSSION: We reviewed the characteristics, features and inheritance patterns of hereditary cancer syndromes. We reviewed common causes of triple negative breast cancer including BRCA mutations and PALB2 mutations.  There are  other genes that can be associated with triple negative breast cancer. We also discussed genetic testing, including the appropriate family members to test, the process of testing, insurance coverage and turn-around-time for results. We discussed the implications of a negative, positive and/or variant of uncertain significant result. We recommended [name] [name] pursue genetic testing for the High risk breast cancer gene panel.   PLAN: Despite our recommendation, [name] [name] did not wish to pursue genetic testing at today's visit. We understand this decision, and remain available to coordinate genetic testing at any time in the future. We therefore, recommend [name] [name] continue to follow the cancer screening guidelines given by her primary healthcare provider.  [name] [name] will talk with her children and will determine if she wants to continue with testing.  She will call and rescheduled if she decides to proceed.  Lastly, we encouraged [name] [name] to remain in contact with cancer genetics annually so that  we can continuously update the family history and inform her of any changes in cancer genetics and testing that may be of benefit for this family.   Ms.  [name] questions were answered to her satisfaction today. Our contact information was provided should additional questions or concerns arise. Thank you for the referral and allowing [place] to share in the care of your patient.   [name] [name] P. Florene Glen, [place], [facility] Certified Genetic Counselor [name] phone: [phone]  The patient was seen for a total of 30 minutes in face-to-face genetic counseling.  This patient was discussed with Drs. Magrinat, Lindi Adie and/or Burr Medico who agrees with the above.    _______________________________________________________________________ For Office Staff:  Number of people involved in session: 1 Was an Intern/ student involved with case: no

## 2014-11-03 ENCOUNTER — Other Ambulatory Visit: Payer: Self-pay | Admitting: *Deleted

## 2014-11-03 ENCOUNTER — Encounter (HOSPITAL_BASED_OUTPATIENT_CLINIC_OR_DEPARTMENT_OTHER): Payer: Self-pay | Admitting: *Deleted

## 2014-11-03 MED ORDER — DIAZEPAM 2 MG PO TABS: 2.0000 mg | ORAL_TABLET | Freq: Once | ORAL | Status: DC

## 2014-11-03 NOTE — Telephone Encounter (Signed)
Okay to: Diazepam as pended above: 2 mg tablet once 30-60 minutes prior to procedure, may repeat in 1 hour if needed. Dispensed 2 tablets, 0 refills Please let patient know that I am thinking of her

## 2014-11-03 NOTE — Telephone Encounter (Signed)
Called pharmacy s[oke with Lemin/pharmacist gave md approval for diazepam. Called pt no answer LMOM med has been called into [facility]...[name] [name]

## 2014-11-03 NOTE — Telephone Encounter (Signed)
Pt states she is schedule to have a Pet Scan done Thursday morning, and she is nervous wanting to see md can rx something to take prior to scan...[name] [name]

## 2014-11-03 NOTE — Progress Notes (Signed)
Had her labs done 10/28/14-ekg 5/15-getting echo this week-

## 2014-11-04 ENCOUNTER — Encounter (HOSPITAL_BASED_OUTPATIENT_CLINIC_OR_DEPARTMENT_OTHER): Payer: Self-pay | Admitting: *Deleted

## 2014-11-05 ENCOUNTER — Ambulatory Visit
Admission: RE | Admit: 2014-11-05 | Discharge: 2014-11-05 | Disposition: A | Discharge status: Home / Self Care | Payer: BC Managed Care – PPO | Source: Ambulatory Visit | Attending: Obstetrics & Gynecology | Admitting: Obstetrics & Gynecology

## 2014-11-05 ENCOUNTER — Encounter: Payer: Self-pay | Admitting: *Deleted

## 2014-11-05 ENCOUNTER — Ambulatory Visit (HOSPITAL_COMMUNITY)
Admission: RE | Admit: 2014-11-05 | Discharge: 2014-11-05 | Disposition: A | Discharge status: Home / Self Care | Payer: BC Managed Care – PPO | Source: Ambulatory Visit | Attending: Oncology | Admitting: Oncology

## 2014-11-05 ENCOUNTER — Other Ambulatory Visit: Payer: BC Managed Care – PPO

## 2014-11-05 DIAGNOSIS — C50411 Malignant neoplasm of upper-outer quadrant of right female breast: Secondary | ICD-10-CM | POA: Diagnosis not present

## 2014-11-05 DIAGNOSIS — Z08 Encounter for follow-up examination after completed treatment for malignant neoplasm: Secondary | ICD-10-CM

## 2014-11-05 DIAGNOSIS — R928 Other abnormal and inconclusive findings on diagnostic imaging of breast: Secondary | ICD-10-CM

## 2014-11-05 DIAGNOSIS — C499 Malignant neoplasm of connective and soft tissue, unspecified: Secondary | ICD-10-CM

## 2014-11-05 LAB — GLUCOSE, CAPILLARY: Glucose-Capillary: 101 mg/dL — ABNORMAL HIGH (ref 70–99)

## 2014-11-05 MED ORDER — FLUDEOXYGLUCOSE F - 18 (FDG) INJECTION: 10.7000 | Freq: Once | INTRAVENOUS | Status: AC | PRN

## 2014-11-05 NOTE — Progress Notes (Signed)
  Echocardiogram 2D Echocardiogram has been performed.  Labrian Torregrossa FRANCES 11/05/2014, 9:46 AM

## 2014-11-06 ENCOUNTER — Ambulatory Visit (HOSPITAL_BASED_OUTPATIENT_CLINIC_OR_DEPARTMENT_OTHER): Payer: BC Managed Care – PPO | Admitting: Anesthesiology

## 2014-11-06 ENCOUNTER — Ambulatory Visit (HOSPITAL_COMMUNITY): Payer: BC Managed Care – PPO

## 2014-11-06 ENCOUNTER — Encounter (HOSPITAL_BASED_OUTPATIENT_CLINIC_OR_DEPARTMENT_OTHER): Payer: Self-pay | Admitting: Anesthesiology

## 2014-11-06 ENCOUNTER — Encounter (HOSPITAL_BASED_OUTPATIENT_CLINIC_OR_DEPARTMENT_OTHER)
Admission: RE | Disposition: A | Discharge status: Home / Self Care | Payer: Self-pay | Source: Ambulatory Visit | Attending: General Surgery

## 2014-11-06 ENCOUNTER — Ambulatory Visit (HOSPITAL_BASED_OUTPATIENT_CLINIC_OR_DEPARTMENT_OTHER)
Admission: RE | Admit: 2014-11-06 | Discharge: 2014-11-06 | Disposition: A | Discharge status: Home / Self Care | Payer: BC Managed Care – PPO | Source: Ambulatory Visit | Attending: General Surgery | Admitting: General Surgery

## 2014-11-06 ENCOUNTER — Telehealth: Payer: Self-pay | Admitting: *Deleted

## 2014-11-06 DIAGNOSIS — I10 Essential (primary) hypertension: Secondary | ICD-10-CM | POA: Insufficient documentation

## 2014-11-06 DIAGNOSIS — Z95828 Presence of other vascular implants and grafts: Secondary | ICD-10-CM

## 2014-11-06 DIAGNOSIS — C50911 Malignant neoplasm of unspecified site of right female breast: Secondary | ICD-10-CM | POA: Insufficient documentation

## 2014-11-06 DIAGNOSIS — Z6834 Body mass index (BMI) 34.0-34.9, adult: Secondary | ICD-10-CM | POA: Diagnosis not present

## 2014-11-06 DIAGNOSIS — Z86010 Personal history of colonic polyps: Secondary | ICD-10-CM | POA: Diagnosis not present

## 2014-11-06 DIAGNOSIS — F419 Anxiety disorder, unspecified: Secondary | ICD-10-CM | POA: Insufficient documentation

## 2014-11-06 DIAGNOSIS — E669 Obesity, unspecified: Secondary | ICD-10-CM | POA: Insufficient documentation

## 2014-11-06 DIAGNOSIS — G43909 Migraine, unspecified, not intractable, without status migrainosus: Secondary | ICD-10-CM | POA: Diagnosis not present

## 2014-11-06 DIAGNOSIS — K219 Gastro-esophageal reflux disease without esophagitis: Secondary | ICD-10-CM | POA: Diagnosis not present

## 2014-11-06 HISTORY — PX: PORTACATH PLACEMENT: SHX2246

## 2014-11-06 SURGERY — INSERTION, TUNNELED CENTRAL VENOUS DEVICE, WITH PORT
Anesthesia: General | Site: Chest | Laterality: Left

## 2014-11-06 MED ORDER — MIDAZOLAM HCL 2 MG/2ML IJ SOLN: 1.0000 mg | INTRAMUSCULAR | Status: DC | PRN

## 2014-11-06 MED ORDER — METOPROLOL TARTRATE 1 MG/ML IV SOLN: INTRAVENOUS | Status: DC | PRN

## 2014-11-06 MED ORDER — HEPARIN (PORCINE) IN NACL 2-0.9 UNIT/ML-% IJ SOLN
INTRAMUSCULAR | Status: DC | PRN
  Administered 2014-11-06: 1 via INTRAVENOUS

## 2014-11-06 MED ORDER — DEXAMETHASONE SODIUM PHOSPHATE 4 MG/ML IJ SOLN
INTRAMUSCULAR | Status: DC | PRN
  Administered 2014-11-06: 10 mg via INTRAVENOUS

## 2014-11-06 MED ORDER — ONDANSETRON HCL 4 MG/2ML IJ SOLN: 4.0000 mg | Freq: Four times a day (QID) | INTRAMUSCULAR | Status: DC | PRN

## 2014-11-06 MED ORDER — PROPOFOL 10 MG/ML IV BOLUS
INTRAVENOUS | Status: DC | PRN
  Administered 2014-11-06: 20 mg via INTRAVENOUS
  Administered 2014-11-06: 30 mg via INTRAVENOUS
  Administered 2014-11-06: 200 mg via INTRAVENOUS

## 2014-11-06 MED ORDER — BUPIVACAINE HCL (PF) 0.25 % IJ SOLN
INTRAMUSCULAR | Status: DC | PRN
  Administered 2014-11-06: 10 mL

## 2014-11-06 MED ORDER — MIDAZOLAM HCL 5 MG/5ML IJ SOLN
INTRAMUSCULAR | Status: DC | PRN
  Administered 2014-11-06: 2 mg via INTRAVENOUS

## 2014-11-06 MED ORDER — PROPOFOL 10 MG/ML IV EMUL
INTRAVENOUS | Status: AC
  Filled 2014-11-06: qty 50

## 2014-11-06 MED ORDER — CEFAZOLIN SODIUM-DEXTROSE 2-3 GM-% IV SOLR: 2.0000 g | INTRAVENOUS | Status: DC

## 2014-11-06 MED ORDER — OXYCODONE-ACETAMINOPHEN 10-325 MG PO TABS: 1.0000 | ORAL_TABLET | Freq: Four times a day (QID) | ORAL | Status: DC | PRN

## 2014-11-06 MED ORDER — OXYCODONE HCL 5 MG/5ML PO SOLN: 5.0000 mg | Freq: Once | ORAL | Status: DC | PRN

## 2014-11-06 MED ORDER — CEFAZOLIN SODIUM-DEXTROSE 2-3 GM-% IV SOLR
INTRAVENOUS | Status: AC
  Filled 2014-11-06: qty 50

## 2014-11-06 MED ORDER — ONDANSETRON HCL 4 MG/2ML IJ SOLN
INTRAMUSCULAR | Status: DC | PRN
  Administered 2014-11-06: 4 mg via INTRAVENOUS

## 2014-11-06 MED ORDER — FENTANYL CITRATE 0.05 MG/ML IJ SOLN: 25.0000 ug | INTRAMUSCULAR | Status: DC | PRN

## 2014-11-06 MED ORDER — MIDAZOLAM HCL 2 MG/2ML IJ SOLN
INTRAMUSCULAR | Status: AC
  Filled 2014-11-06: qty 2

## 2014-11-06 MED ORDER — OXYCODONE HCL 5 MG PO TABS: 5.0000 mg | ORAL_TABLET | Freq: Once | ORAL | Status: DC | PRN

## 2014-11-06 MED ORDER — FENTANYL CITRATE 0.05 MG/ML IJ SOLN
INTRAMUSCULAR | Status: AC
  Filled 2014-11-06: qty 6

## 2014-11-06 MED ORDER — PROPOFOL 10 MG/ML IV BOLUS
INTRAVENOUS | Status: AC
  Filled 2014-11-06: qty 80

## 2014-11-06 MED ORDER — HEPARIN SOD (PORK) LOCK FLUSH 100 UNIT/ML IV SOLN
INTRAVENOUS | Status: DC | PRN
  Administered 2014-11-06: 500 [IU] via INTRAVENOUS

## 2014-11-06 MED ORDER — LIDOCAINE HCL (CARDIAC) 20 MG/ML IV SOLN
INTRAVENOUS | Status: DC | PRN
  Administered 2014-11-06: 50 mg via INTRAVENOUS

## 2014-11-06 MED ORDER — FENTANYL CITRATE 0.05 MG/ML IJ SOLN
INTRAMUSCULAR | Status: DC | PRN
Start: 2014-11-06 — End: 2014-11-06
  Administered 2014-11-06 (×2): 50 ug via INTRAVENOUS

## 2014-11-06 MED ORDER — CEFAZOLIN SODIUM-DEXTROSE 2-3 GM-% IV SOLR
INTRAVENOUS | Status: DC | PRN
  Administered 2014-11-06: 2 g via INTRAVENOUS

## 2014-11-06 MED ORDER — BACITRACIN ZINC 500 UNIT/GM EX OINT
TOPICAL_OINTMENT | CUTANEOUS | Status: AC
  Filled 2014-11-06: qty 0.9

## 2014-11-06 MED ORDER — LACTATED RINGERS IV SOLN
INTRAVENOUS | Status: DC
  Administered 2014-11-06: 12:00:00 via INTRAVENOUS

## 2014-11-06 MED ORDER — BUPIVACAINE HCL (PF) 0.25 % IJ SOLN
INTRAMUSCULAR | Status: AC
  Filled 2014-11-06: qty 30

## 2014-11-06 MED ORDER — FENTANYL CITRATE 0.05 MG/ML IJ SOLN: 50.0000 ug | INTRAMUSCULAR | Status: DC | PRN

## 2014-11-06 SURGICAL SUPPLY — 52 items
APL SKNCLS STERI-STRIP NONHPOA (GAUZE/BANDAGES/DRESSINGS) ×1
BAG DECANTER FOR FLEXI CONT (MISCELLANEOUS) ×3 IMPLANT
BENZOIN TINCTURE PRP APPL 2/3 (GAUZE/BANDAGES/DRESSINGS) ×3 IMPLANT
BLADE SURG 11 STRL SS (BLADE) ×3 IMPLANT
BLADE SURG 15 STRL LF DISP TIS (BLADE) ×1 IMPLANT
BLADE SURG 15 STRL SS (BLADE) ×3
CANISTER SUCT 1200ML W/VALVE (MISCELLANEOUS) IMPLANT
CHLORAPREP W/TINT 26ML (MISCELLANEOUS) ×3 IMPLANT
CLOSURE WOUND 1/2 X4 (GAUZE/BANDAGES/DRESSINGS)
COVER BACK TABLE 60X90IN (DRAPES) ×3 IMPLANT
COVER MAYO STAND STRL (DRAPES) ×3 IMPLANT
DECANTER SPIKE VIAL GLASS SM (MISCELLANEOUS) IMPLANT
DRAPE C-ARM 42X72 X-RAY (DRAPES) ×3 IMPLANT
DRAPE LAPAROSCOPIC ABDOMINAL (DRAPES) ×3 IMPLANT
DRAPE UTILITY XL STRL (DRAPES) ×3 IMPLANT
DRSG TEGADERM 4X4.75 (GAUZE/BANDAGES/DRESSINGS) IMPLANT
ELECT COATED BLADE 2.86 ST (ELECTRODE) ×3 IMPLANT
ELECT REM PT RETURN 9FT ADLT (ELECTROSURGICAL) ×3
ELECTRODE REM PT RTRN 9FT ADLT (ELECTROSURGICAL) ×1 IMPLANT
GLOVE BIO SURGEON STRL SZ7 (GLOVE) ×3 IMPLANT
GLOVE BIOGEL PI IND STRL 7.5 (GLOVE) ×2 IMPLANT
GLOVE BIOGEL PI INDICATOR 7.5 (GLOVE) ×4
GLOVE SURG SS PI 7.5 STRL IVOR (GLOVE) ×2 IMPLANT
GOWN STRL REUS W/ TWL LRG LVL3 (GOWN DISPOSABLE) ×4 IMPLANT
GOWN STRL REUS W/TWL LRG LVL3 (GOWN DISPOSABLE) ×6
IV KIT MINILOC 20X1 SAFETY (NEEDLE) IMPLANT
KIT PORT POWER 8FR ISP CVUE (Catheter) ×2 IMPLANT
LIQUID BAND (GAUZE/BANDAGES/DRESSINGS) ×3 IMPLANT
MARKER SKIN DUAL TIP RULER LAB (MISCELLANEOUS) ×3 IMPLANT
NDL HYPO 25X1 1.5 SAFETY (NEEDLE) ×1 IMPLANT
NDL SAFETY ECLIPSE 18X1.5 (NEEDLE) IMPLANT
NEEDLE HYPO 18GX1.5 SHARP (NEEDLE)
NEEDLE HYPO 25X1 1.5 SAFETY (NEEDLE) ×3 IMPLANT
PACK BASIN DAY SURGERY FS (CUSTOM PROCEDURE TRAY) ×3 IMPLANT
PENCIL BUTTON HOLSTER BLD 10FT (ELECTRODE) ×3 IMPLANT
SLEEVE SCD COMPRESS KNEE MED (MISCELLANEOUS) ×3 IMPLANT
SPONGE GAUZE 4X4 12PLY STER LF (GAUZE/BANDAGES/DRESSINGS) ×3 IMPLANT
STAPLER VISISTAT 35W (STAPLE) ×1 IMPLANT
STRIP CLOSURE SKIN 1/2X4 (GAUZE/BANDAGES/DRESSINGS) ×1 IMPLANT
SUT MON AB 4-0 PC3 18 (SUTURE) ×3 IMPLANT
SUT PROLENE 2 0 SH DA (SUTURE) ×3 IMPLANT
SUT SILK 2 0 TIES 17X18 (SUTURE)
SUT SILK 2-0 18XBRD TIE BLK (SUTURE) IMPLANT
SUT VIC AB 3-0 SH 27 (SUTURE) ×3
SUT VIC AB 3-0 SH 27X BRD (SUTURE) ×1 IMPLANT
SYR 5ML LUER SLIP (SYRINGE) ×3 IMPLANT
SYR CONTROL 10ML LL (SYRINGE) ×3 IMPLANT
TOWEL OR 17X24 6PK STRL BLUE (TOWEL DISPOSABLE) ×3 IMPLANT
TOWEL OR NON WOVEN STRL DISP B (DISPOSABLE) ×3 IMPLANT
TUBE CONNECTING 20'X1/4 (TUBING)
TUBE CONNECTING 20X1/4 (TUBING) IMPLANT
YANKAUER SUCT BULB TIP NO VENT (SUCTIONS) IMPLANT

## 2014-11-06 NOTE — Anesthesia Preprocedure Evaluation (Signed)
Anesthesia Evaluation  Patient identified by MRN, date of birth, ID band Patient awake    Reviewed: Allergy & Precautions, NPO status , Patient's Chart, lab work & pertinent test results  Airway Mallampati: II   Neck ROM: full    Dental   Pulmonary          Cardiovascular hypertension,     Neuro/Psych  Headaches, Anxiety    GI/Hepatic GERD-  ,  Endo/Other  obese  Renal/GU      Musculoskeletal   Abdominal   Peds  Hematology   Anesthesia Other Findings   Reproductive/Obstetrics                             Anesthesia Physical Anesthesia Plan  ASA: II  Anesthesia Plan: General   Post-op Pain Management:    Induction: Intravenous  Airway Management Planned: LMA  Additional Equipment:   Intra-op Plan:   Post-operative Plan:   Informed Consent: I have reviewed the patients History and Physical, chart, labs and discussed the procedure including the risks, benefits and alternatives for the proposed anesthesia with the patient or authorized representative who has indicated his/her understanding and acceptance.     Plan Discussed with: CRNA, Anesthesiologist and Surgeon  Anesthesia Plan Comments:         Anesthesia Quick Evaluation

## 2014-11-06 NOTE — Anesthesia Procedure Notes (Signed)
Procedure Name: LMA Insertion Date/Time: 11/06/2014 12:50 PM Performed by: Toula Moos L Pre-anesthesia Checklist: Patient identified, Emergency Drugs available, Suction available, Patient being monitored and Timeout performed Patient Re-evaluated:Patient Re-evaluated prior to inductionOxygen Delivery Method: Circle System Utilized Preoxygenation: Pre-oxygenation with 100% oxygen Intubation Type: IV induction Ventilation: Mask ventilation without difficulty LMA: LMA inserted LMA Size: 4.0 Number of attempts: 1 Airway Equipment and Method: Bite block Placement Confirmation: positive ETCO2 Tube secured with: Tape Dental Injury: Teeth and Oropharynx as per pre-operative assessment

## 2014-11-06 NOTE — H&P (Signed)
81 yof who has palpable right breast mass since end of January that has not changed significantly since she noted it. She underwent mm that had focal irregular mass with larger area of calcifications. Her breast density is C. US shows a 2.9x1.5x1.6 cm mass and the right axillary US shows 3 contiguous level on nodes with the largest measuring 11 mm. MRI was then performed which shows a nl left breast, nl left ax nodes, nl im nodes and a right breast mass measuring 2.5x2.2x2.1 cm. 5 mm anterior and superior to this is another 1.4x0.9x1.2 cm mass with an additional 6.5x4.8 cm NME. Biopsy was performed that shows a grade III IDC/DCIS with LVI that is triple negative and the Ki is 71%. She reports no discharge   Other Problems Francee Nodal, RN; 10/28/2014 9:12 AM) Breast Cancer Gastroesophageal Reflux Disease High blood pressure Migraine Headache Other disease, cancer, significant illness  Past Surgical History Francee Nodal, RN; 10/28/2014 9:12 AM) Breast Biopsy Right. Colon Polyp Removal - Colonoscopy Oral Surgery  Diagnostic Studies History Francee Nodal, RN; 10/28/2014 9:12 AM) Colonoscopy 1-5 years ago Mammogram within last year Pap Smear 1-5 years ago  Social History Francee Nodal, RN; 10/28/2014 9:12 AM) Caffeine use Carbonated beverages. No alcohol use No drug use Tobacco use Never smoker.  Family History Francee Nodal, RN; 10/28/2014 9:12 AM) Alcohol Abuse Brother. Arthritis Mother. Cerebrovascular Accident Mother. Depression Brother. Diabetes Mellitus Mother. Heart Disease Mother. Hypertension Brother, Mother. Seizure disorder Brother.   Review of Systems Francee Nodal RN; 10/28/2014 9:12 AM) General Not Present- Appetite Loss, Chills, Fatigue, Fever, Night Sweats, Weight Gain and Weight Loss. Skin Not Present- Change in Wart/Mole, Dryness, Hives, Jaundice, New Lesions, Non-Healing Wounds, Rash and Ulcer. HEENT Present- Wears  glasses/contact lenses. Not Present- Earache, Hearing Loss, Hoarseness, Nose Bleed, Oral Ulcers, Ringing in the Ears, Seasonal Allergies, Sinus Pain, Sore Throat, Visual Disturbances and Yellow Eyes. Respiratory Not Present- Bloody sputum, Chronic Cough, Difficulty Breathing, Snoring and Wheezing. Breast Present- Breast Mass. Not Present- Breast Pain, Nipple Discharge and Skin Changes. Cardiovascular Not Present- Chest Pain, Difficulty Breathing Lying Down, Leg Cramps, Palpitations, Rapid Heart Rate, Shortness of Breath and Swelling of Extremities. Gastrointestinal Present- Change in Bowel Habits, Constipation and Difficulty Swallowing. Not Present- Abdominal Pain, Bloating, Bloody Stool, Chronic diarrhea, Excessive gas, Gets full quickly at meals, Hemorrhoids, Indigestion, Nausea, Rectal Pain and Vomiting. Female Genitourinary Not Present- Frequency, Nocturia, Painful Urination, Pelvic Pain and Urgency. Musculoskeletal Not Present- Back Pain, Joint Pain, Joint Stiffness, Muscle Pain, Muscle Weakness and Swelling of Extremities. Neurological Present- Numbness. Not Present- Decreased Memory, Fainting, Headaches, Seizures, Tingling, Tremor, Trouble walking and Weakness. Psychiatric Not Present- Anxiety, Bipolar, Change in Sleep Pattern, Depression, Fearful and Frequent crying. Endocrine Present- Hot flashes. Not Present- Cold Intolerance, Excessive Hunger, Hair Changes, Heat Intolerance and New Diabetes. Hematology Present- Easy Bruising. Not Present- Excessive bleeding, Gland problems, HIV and Persistent Infections.   Physical Exam [name] MD; 10/28/2014 8:55 PM) General Mental Status-Alert. Orientation-Oriented X3. Chest and Lung Exam Chest and lung exam reveals -on auscultation, normal breath sounds, no adventitious sounds and normal vocal resonance. Breast Nipples-No Discharge. Cardiovascular Cardiovascular examination reveals -normal heart sounds, regular rate and rhythm  with no murmurs. Lymphatic Head & Neck General Head & Neck Lymphatics: Bilateral - Description - Normal. Axillary General Axillary Region: Bilateral - Description - Normal. Note: no [place] adenopathy   Assessment & Plan [name] MD; 10/28/2014 8:58 PM) BREAST CANCER, RIGHT (174.9  C50.911) Port placement We discussed the staging and pathophysiology  of breast cancer. We discussed all of the different options for treatment for breast cancer including surgery, chemotherapy, radiation therapy, Herceptin, and antiestrogen therapy. We discussed port placement and risks associated with it.

## 2014-11-06 NOTE — Telephone Encounter (Signed)
Spoke with patient from [facility] 10/28/14. She is doing well.  She will be getting her port placed today.  She had many appointments yesterday.  She is aware of her appointment with Dr. Jana Hakim on 11/13/14.  Encouraged her to call with any needs or concerns.

## 2014-11-06 NOTE — Op Note (Signed)
Preoperative diagnosis: Breast cancer, needs venous access Postoperative diagnosis: same as above Procedure: Left subclavian PowerPort insertion Surgeon: Dr. Serita Grammes Anesthesia: general with lma Estimated blood loss: Minimal Drains: None Counts gauge: None Sponge count was correct Disposition to recovery stable  Indications: This a 65 yof with right breast cancer who has undergone evaluation in our multidisciplinary clinic. We have elected to begin with primary chemotherapy followed by surgery.    Procedure: After informed consent was obtained the patient was taken to the operating room. She was given cefazolin. SCDs were placed. She was placed under general anesthesia. Arms were tucked and appropriately padded. She was then prepped and draped in a standard sterile surgical fashion. A surgical timeout was then performed.  I infiltrated Marcaine throughout the left chest wall as well as onto the clavicle. I accessed her subclavian vein on the first pass. The wire was placed. I then made a pocket overlying the pectoralis fascia. I then tunneled the line between the 2 sites. I then dilated the tract. I then inserted the peel-away sheath and dilator assembly and watched this go into position under fluoroscopy. I removed the wire assembly. I then placed the line. The peel-away sheath was removed. The line was pulled back to be in the cava. I pulled back a little shorter as she was having ectopy. The port was then attached. This was secured with 2-0 Prolene in 2 places. I then flushed this and aspirated blood. I then closed this with 3-0 Vicryl and 4-0 Monocryl.This aspirated blood and I placed heparin in the port. Dermabond was placed over the wound. She tolerated this well as expected and transferred to recovery stable.

## 2014-11-06 NOTE — Transfer of Care (Signed)
Immediate Anesthesia Transfer of Care Note  Patient: [name] [name]  Procedure(s) Performed: Procedure(s): INSERTION PORT-A-CATH (Left)  Patient Location: PACU  Anesthesia Type:General  Level of Consciousness: awake and patient cooperative  Airway & Oxygen Therapy: Patient Spontanous Breathing and Patient connected to face mask oxygen  Post-op Assessment: Report given to RN and Post -op Vital signs reviewed and stable  Post vital signs: Reviewed and stable  Last Vitals:  Filed Vitals:   11/06/14 1133  BP: 157/88  Pulse: 98  Temp: 36.8 C  Resp: 18    Complications: No apparent anesthesia complications

## 2014-11-06 NOTE — Discharge Instructions (Signed)
° ° °PORT-A-CATH: POST OP INSTRUCTIONS ° °Always review your discharge instruction sheet given to you by the facility where your surgery was performed.  ° °1. A prescription for pain medication may be given to you upon discharge. Take your pain medication as prescribed, if needed. If narcotic pain medicine is not needed, then you make take acetaminophen (Tylenol) or ibuprofen (Advil) as needed.  °2. Take your usually prescribed medications unless otherwise directed. °3. If you need a refill on your pain medication, please contact our office. All narcotic pain medicine now requires a paper prescription.  Phoned in and fax refills are no longer allowed by law.  Prescriptions will not be filled after 5 pm or on weekends.  °4. You should follow a light diet for the remainder of the day after your procedure. °5. Most patients will experience some mild swelling and/or bruising in the area of the incision. It may take several days to resolve. °6. It is common to experience some constipation if taking pain medication after surgery. Increasing fluid intake and taking a stool softener (such as Colace) will usually help or prevent this problem from occurring. A mild laxative (Milk of Magnesia or Miralax) should be taken according to package directions if there are no bowel movements after 48 hours.  °7. Unless discharge instructions indicate otherwise, you may remove your bandages 48 hours after surgery, and you may shower at that time. You may have steri-strips (small white skin tapes) in place directly over the incision.  These strips should be left on the skin for 7-10 days.  If your surgeon used Dermabond (skin glue) on the incision, you may shower in 24 hours.  The glue will flake off over the next 2-3 weeks.  °8. If your port is left accessed at the end of surgery (needle left in port), the dressing cannot get wet and should only by changed by a healthcare professional. When the port is no longer accessed (when the  needle has been removed), follow step 7.   °9. ACTIVITIES:  Limit activity involving your arms for the next 72 hours. Do no strenuous exercise or activity for 1 week. You may drive when you are no longer taking prescription pain medication, you can comfortably wear a seatbelt, and you can maneuver your car. °10.You may need to see your doctor in the office for a follow-up appointment.  Please °      check with your doctor.  °11.When you receive a new Port-a-Cath, you will get a product guide and  °      ID card.  Please keep them in case you need them. ° °WHEN TO CALL YOUR DOCTOR ([phone]): °1. Fever over 101.0 °2. Chills °3. Continued bleeding from incision °4. Increased redness and tenderness at the site °5. Shortness of breath, difficulty breathing ° ° °The clinic staff is available to answer your questions during regular business hours. Please don’t hesitate to call and ask to speak to one of the nurses or medical assistants for clinical concerns. If you have a medical emergency, go to the nearest emergency room or call 911.  A surgeon from [facility] is always on call at the hospital.  ° ° ° °For further information, please visit [email or web address] ° ° °Post Anesthesia Home Care Instructions ° °Activity: °Get plenty of rest for the remainder of the day. A responsible adult should stay with you for 24 hours following the procedure.  °For the next 24 hours, DO NOT: °-Drive a car °-  Operate machinery °-Drink alcoholic beverages °-Take any medication unless instructed by your physician °-Make any legal decisions or sign important papers. ° °Meals: °Start with liquid foods such as gelatin or soup. Progress to regular foods as tolerated. Avoid greasy, spicy, heavy foods. If nausea and/or vomiting occur, drink only clear liquids until the nausea and/or vomiting subsides. Call your physician if vomiting continues. ° °Special Instructions/Symptoms: °Your throat may feel dry or sore from the  anesthesia or the breathing tube placed in your throat during surgery. If this causes discomfort, gargle with warm salt water. The discomfort should disappear within 24 hours. ° ° ° ° ° ° °

## 2014-11-06 NOTE — Anesthesia Postprocedure Evaluation (Signed)
Anesthesia Post Note  Patient: [name] [name]  Procedure(s) Performed: Procedure(s) (LRB): INSERTION PORT-A-CATH (Left)  Anesthesia type: General  Patient location: PACU  Post pain: Pain level controlled and Adequate analgesia  Post assessment: Post-op Vital signs reviewed, Patient's Cardiovascular Status Stable, Respiratory Function Stable, Patent Airway and Pain level controlled  Last Vitals:  Filed Vitals:   11/06/14 1415  BP: 133/78  Pulse: 91  Temp:   Resp: 10    Post vital signs: Reviewed and stable  Level of consciousness: awake, alert  and oriented  Complications: No apparent anesthesia complications

## 2014-11-06 NOTE — Interval H&P Note (Signed)
History and Physical Interval Note:  11/06/2014 12:33 PM  [name] [name]  has presented today for surgery, with the diagnosis of Breast Cancer  The various methods of treatment have been discussed with the patient and family. After consideration of risks, benefits and other options for treatment, the patient has consented to  Procedure(s): INSERTION PORT-A-CATH (N/A) as a surgical intervention .  The patient's history has been reviewed, patient examined, no change in status, stable for surgery.  I have reviewed the patient's chart and labs.  Questions were answered to the patient's satisfaction.     [name]

## 2014-11-09 ENCOUNTER — Encounter (HOSPITAL_BASED_OUTPATIENT_CLINIC_OR_DEPARTMENT_OTHER): Payer: Self-pay | Admitting: General Surgery

## 2014-11-13 ENCOUNTER — Encounter: Payer: Self-pay | Admitting: *Deleted

## 2014-11-13 ENCOUNTER — Ambulatory Visit (HOSPITAL_BASED_OUTPATIENT_CLINIC_OR_DEPARTMENT_OTHER): Payer: BC Managed Care – PPO | Admitting: Oncology

## 2014-11-13 VITALS — BP 143/58 | HR 93 | Temp 98.7°F | Resp 18 | Ht 66.0 in | Wt 216.0 lb

## 2014-11-13 DIAGNOSIS — C50411 Malignant neoplasm of upper-outer quadrant of right female breast: Secondary | ICD-10-CM

## 2014-11-13 DIAGNOSIS — I10 Essential (primary) hypertension: Secondary | ICD-10-CM

## 2014-11-13 DIAGNOSIS — Z171 Estrogen receptor negative status [ER-]: Secondary | ICD-10-CM

## 2014-11-13 DIAGNOSIS — G35 Multiple sclerosis: Secondary | ICD-10-CM

## 2014-11-13 MED ORDER — LORAZEPAM 0.5 MG PO TABS
0.5000 mg | ORAL_TABLET | Freq: Every day | ORAL | Status: DC
Start: 2014-11-13 — End: 2014-11-25

## 2014-11-13 MED ORDER — LIDOCAINE-PRILOCAINE 2.5-2.5 % EX CREA: TOPICAL_CREAM | CUTANEOUS | Status: DC

## 2014-11-13 MED ORDER — DEXAMETHASONE 4 MG PO TABS: ORAL_TABLET | ORAL | Status: DC

## 2014-11-13 MED ORDER — PROCHLORPERAZINE MALEATE 10 MG PO TABS: 10.0000 mg | ORAL_TABLET | Freq: Four times a day (QID) | ORAL | Status: DC | PRN

## 2014-11-13 MED ORDER — ONDANSETRON HCL 8 MG PO TABS: 8.0000 mg | ORAL_TABLET | Freq: Two times a day (BID) | ORAL | Status: DC | PRN

## 2014-11-13 NOTE — Patient Instructions (Signed)
HOW TO TAKE NAUSEA AND SUPPORTIVE MEDICINES FOR YOUR CHEMOTHERAPY REGIMEN: AC   Medication name How to take medication Day 0 Day 1 [chemo day] Day 2 ("shot" day) Day 3 Day 4 Day 5  dexamethasone (Decadron) 4 mg two tablets  twice daily with food  -  + start at supper  +  +  last dose  at breakfast  -  ondansetron (Zofran) 8 mg one tablet twice daily   -  -   -  +/-  +/-  -  prochlorperazine (Compazine) 10 mg one tablet every 6 hours   -  start at supper   +  +  +  +/-  lorazepam (Ativan) 0.5 mg one tablet at bedtime   -  +  +  +  +/-  +/-  Loratadine (Claritin) 10 mg One tablet daily - - + + _ -   REMEMBER: to apply the numbing cream to your port 1-2 hours before chemotherapy, and to cover with plastic wrap  +  means take the medicine as prescribed  -  means do NOT take the medicine this day +/- means take the medicine as needed

## 2014-11-13 NOTE — Progress Notes (Signed)
Faxed request from [facility] for prior authorization received for zofran 8mg  - placed in managed care bin

## 2014-11-13 NOTE — Progress Notes (Signed)
[place]  [phone] [phone]) [phone]     ID: [name] [name] DOB: Aug 14, 1961  MR#: [id number]  ASN#:[id number]  Patient Care Team: [name], MD as PCP - General Sharene Butters, MD as Consulting Physician (Obstetrics and Gynecology) [name], MD (Gastroenterology) [name]. [name], MD (Neurology) [name], MD as Consulting Physician (General Surgery) [name], MD as Consulting Physician (Oncology) [name], MD as Attending Physician (Radiation Oncology) Roselee Culver, RN as Registered Nurse [place], RN as Registered Nurse OTHER MD:  CHIEF COMPLAINT: Triple negative breast cancer  CURRENT TREATMENT: Neoadjuvant chemotherapy   BREAST CANCER HISTORY: From the original intake note:  [name] had bilateral screening mammography at the [facility] 11/21/2013. Breast density was category C. Exam was unremarkable. In January 2016 however the patient palpated a change in her right breast laterally. She contacted her gynecologist, Dr. [name], and he set her up for a unilateral right diagnostic mammography with right breast ultrasonography at [facility] 10/15/2014. There was a focal irregular opacity in the lateral right breast associated with pleomorphic calcifications. This was palpable on exam as a firm non-mobile mass. Ultrasound confirmed a hypoechoic mass in the right breast at the 9:00 position 5 cm from the nipple, measuring 2.9 cm. Next to this mass was a normal-sized intramammary lymph node measuring 4 mm. In the right axilla there were 3 contiguous level I lymph nodes the largest measuring 11 mm.  On 10/20/2014 the patient underwent right core needle biopsy of the 9:00 breast mass, showing ([id number]) and invasive ductal carcinoma, grade 3, triple negative, with the HER-2/neu signals ratio of 1.26 and number per cell 2.15. The MIB-1 was 71%. Biopsy of the largest right axillary lymph node was  negative. Dr. Owens Shark the mammographer who performed a biopsy describes this is concordant.  Left breast mammography 10/26/2014 was negative, and bilateral breast MRI the same day confirmed, in the right breast, and upper outer quadrant mass measuring 2.5 cm, with a second mass measuring 1.4 cm slightly anterior and superior to it. In addition there was a total area of approximately 6.5 cm of non-masslike enhancement extending throughout the upper outer quadrant of the right breast. There was a cortically thickened right axillary lymph node which had been biopsied. There was also a mildly cortically thickened right subpectoral lymph node. There were no internal mammary or left axillary lymph nodes in the left breast was unremarkable.  The patient's subsequent history is as detailed below.  INTERVAL HISTORY: [name] returns today for follow-up of her breast cancer. Since her last visit here she had her port placed. That went well. She never filled her pain medication, did not have any problems with fever or bleeding. Also since her last visit here she had an echocardiogram which shows an excellent ejection fraction. Finally she met with our genetics counselor. That did not go so well. She tells me she was confused and overwhelmed by the discussion and ended up thinking she did not want to have the genetics testing. Now however she wonders if she should have it. Finally she met with our "chemotherapy school" nurse and greatly appreciated her input.  Since her last visit also she also had a right axillary lymph node biopsy on 10/30/2014, which was negative ([id number]) an biopsy of the most posterior extent of the right breast microcalcifications 11/05/2014. That showed ([id number]) ductal carcinoma in situ, high-grade, with microinvasion. The microinvasive component was again estrogen and progesterone receptor  negative, HER-2 negative with a signals ratio of 1.26 and the number per cell 2.15, and an MIB-1 of  63%.  REVIEW OF SYSTEMS: She has minimal sinus problems, which she is taking care of with daily Claritin. She has minimal finger numbness, which is long-standing. A detailed review of systems today was otherwise stable.  PAST MEDICAL HISTORY: Past Medical History  Diagnosis Date  . METABOLIC SYNDROME X   . OBESITY, TRUNCAL   . CONSTIPATION, CHRONIC   . KELOID   . Multiple sclerosis   . Allergic rhinitis due to other allergen   . HYPERTENSION   . HYPERLIPIDEMIA   . GERD   . Angioedema     ACEI  . Colon polyps 05/2014    adenomatous, colo q 5y  . Migraine headache   . Allergy   . Anxiety   . Breast cancer of upper-outer quadrant of right female breast 10/23/2014  . Hot flashes     PAST SURGICAL HISTORY: Past Surgical History  Procedure Laterality Date  . Tubal ligation    . Colonoscopy    . Portacath placement Left 11/06/2014    Procedure: INSERTION PORT-A-CATH;  Surgeon: [name], MD;  Location: [facility];  Service: General;  Laterality: Left;    FAMILY HISTORY Family History  Problem Relation Age of Onset  . Coronary artery disease Mother   . Heart disease Mother   . Diabetes Mother   . Hypertension Other   . Hyperlipidemia Other   . Diabetes Other   . Colon cancer Neg Hx   . Alcohol abuse Father   . COPD Maternal Grandmother    the patient's father died at the age of 23 following his seizure. The patient's mother died from congestive 3 failure at the age of 54. The patient had 5 brothers and 2 sisters. One brother died from Escherichia coli infection, the other one had a history of seizure disorder. One sister died from causes unknown to the patient. There is no history of breast or ovarian cancer in the family.  GYNECOLOGIC HISTORY:  Patient's last menstrual period was 11/01/2012. Menarche age 54, first live birth age 54. The patient is GX P2. She stopped having periods in May 2014. She did not take hormone replacement. She did take her  control pills remotely for approximately 4 years with no complications.  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 She describes herself is single, and lives by herself. Her daughter [name] lives in [place] and works in Ambulance person for [employer]. Son Le Grand "[name]" Villers is an Engineer, building services for [employer]: Not in place   HEALTH MAINTENANCE: History  Substance Use Topics  . Smoking status: Never Smoker   . Smokeless tobacco: Never Used     Comment: Single- brother staying with pt since mom's death  . Alcohol Use: No     Colonoscopy: September 2015/[name]  PAP: February 2015  Bone density:  Lipid panel:  Allergies  Allergen Reactions  . Ace Inhibitors Anaphylaxis    REACTION: Angioedema (tongue swelling)  . Clarithromycin Anaphylaxis    Throat swells  . Levaquin [Levofloxacin] Other (See Comments)    Tendon pain - shoulder and calf    Current Outpatient Prescriptions  Medication Sig Dispense Refill  . ALPRAZolam (XANAX) 0.25 MG tablet Take 1 tablet (0.25 mg total) by mouth 3 (three) times daily as needed for sleep or anxiety. 30 tablet  1  . COPAXONE 40 MG/ML SOSY INJECT 40 MG UNDER THE SKIN THREE TIMES A WEEK 12 Syringe 0  . diazepam (VALIUM) 2 MG tablet Take 1 tablet (2 mg total) by mouth once. 30-60 minutes prior to procedure; may repeat in 1 hour if needed 2 tablet 0  . Flaxseed, Linseed, OIL Take by mouth daily.    [name] gabapentin (NEURONTIN) 100 MG capsule Take 1 capsule (100 mg total) by mouth 3 (three) times daily. 270 capsule 1  . hydrochlorothiazide (HYDRODIURIL) 25 MG tablet TAKE 1 TABLET BY MOUTH EVERY DAY 90 tablet 1  . loratadine-pseudoephedrine (CLARITIN-D 12 HOUR) 5-120 MG per tablet Take 1 tablet by mouth as needed for allergies. 30 tablet 3  . losartan (COZAAR) 50 MG tablet Take 1 tablet (50 mg total) by mouth 2 (two) times daily. 180 tablet 3  .  metoprolol (LOPRESSOR) 50 MG tablet Take 1 tablet (50 mg total) by mouth 2 (two) times daily. 180 tablet 1  . oxyCODONE-acetaminophen (PERCOCET) 10-325 MG per tablet Take 1 tablet by mouth every 6 (six) hours as needed for pain. 20 tablet 0  . pantoprazole (PROTONIX) 40 MG tablet TAKE 1 TABLET BY MOUTH EVERY DAY 90 tablet 1  . [DISCONTINUED] mometasone (NASONEX) 50 MCG/ACT nasal spray Place 2 sprays into the nose daily. 17 g 2   No current facility-administered medications for this visit.    OBJECTIVE: Middle-aged African-American woman who appears well Filed Vitals:   11/13/14 1613  BP: 143/58  Pulse: 93  Temp: 98.7 F (37.1 C)  Resp: 18     Body mass index is 34.88 kg/(m^2).    ECOG FS:0 - Asymptomatic  Sclerae unicteric, EOMs intact Oropharynx clear and moist No cervical or supraclavicular adenopathy Lungs no rales or rhonchi Heart regular rate and rhythm Abd soft, nontender, positive bowel sounds MSK no focal spinal tenderness, no upper extremity lymphedema Neuro: nonfocal, well oriented, appropriate affect Breasts: Deferred Skin: The port is in the upper anterior left chest wall. It is easily palpable. There is no dehiscence, erythema, or swelling.   LAB RESULTS:  CMP     Component Value Date/Time   NA 141 10/28/2014 1229   NA 137 01/14/2014 1206   K 3.7 10/28/2014 1229   K 3.6 01/14/2014 1206   CL 102 01/14/2014 1206   CO2 27 10/28/2014 1229   CO2 27 01/14/2014 1206   GLUCOSE 104 10/28/2014 1229   GLUCOSE 90 01/14/2014 1206   BUN 14.0 10/28/2014 1229   BUN 16 01/14/2014 1206   CREATININE 0.9 10/28/2014 1229   CREATININE 0.9 01/14/2014 1206   CALCIUM 9.5 10/28/2014 1229   CALCIUM 9.5 01/14/2014 1206   PROT 7.4 10/28/2014 1229   PROT 7.5 01/14/2014 1206   ALBUMIN 4.0 10/28/2014 1229   ALBUMIN 4.2 01/14/2014 1206   AST 13 10/28/2014 1229   AST 22 01/14/2014 1206   ALT 19 10/28/2014 1229   ALT 20 01/14/2014 1206   ALKPHOS 99 10/28/2014 1229   ALKPHOS 77  01/14/2014 1206   BILITOT 0.49 10/28/2014 1229   BILITOT 0.6 01/14/2014 1206   GFRNONAA 98.72 09/30/2009 1023   GFRAA 87 12/30/2007 0842    INo results found for: SPEP, UPEP  Lab Results  Component Value Date   WBC 7.3 10/28/2014   NEUTROABS 3.3 10/28/2014   HGB 13.3 10/28/2014   HCT 40.4 10/28/2014   MCV 85.8 10/28/2014   PLT 313 10/28/2014      Chemistry      Component  Value Date/Time   NA 141 10/28/2014 1229   NA 137 01/14/2014 1206   K 3.7 10/28/2014 1229   K 3.6 01/14/2014 1206   CL 102 01/14/2014 1206   CO2 27 10/28/2014 1229   CO2 27 01/14/2014 1206   BUN 14.0 10/28/2014 1229   BUN 16 01/14/2014 1206   CREATININE 0.9 10/28/2014 1229   CREATININE 0.9 01/14/2014 1206      Component Value Date/Time   CALCIUM 9.5 10/28/2014 1229   CALCIUM 9.5 01/14/2014 1206   ALKPHOS 99 10/28/2014 1229   ALKPHOS 77 01/14/2014 1206   AST 13 10/28/2014 1229   AST 22 01/14/2014 1206   ALT 19 10/28/2014 1229   ALT 20 01/14/2014 1206   BILITOT 0.49 10/28/2014 1229   BILITOT 0.6 01/14/2014 1206       No results found for: LABCA2  No components found for: LABCA125  No results for input(s): INR in the last 168 hours.  Urinalysis    Component Value Date/Time   COLORURINE YELLOW 01/14/2014 1206   APPEARANCEUR CLEAR 01/14/2014 1206   LABSPEC <=1.005* 01/14/2014 1206   PHURINE 5.5 01/14/2014 1206   GLUCOSEU NEGATIVE 01/14/2014 1206   HGBUR NEGATIVE 01/14/2014 1206   HGBUR negative 08/19/2010 1041   BILIRUBINUR NEGATIVE 01/14/2014 1206   KETONESUR NEGATIVE 01/14/2014 1206   UROBILINOGEN 0.2 01/14/2014 1206   NITRITE NEGATIVE 01/14/2014 1206   LEUKOCYTESUR NEGATIVE 01/14/2014 1206    STUDIES: Mr Breast Bilateral W Wo Contrast  10/26/2014   CLINICAL DATA:  Patient with recent diagnosis right breast invasive ductal carcinoma and associated ductal carcinoma in situ.  LABS:  BUN and creatinine were obtained on site at [facility] at  [address]  Results:   BUN 20 mg/dL,  Creatinine 0.9 mg/dL.  EXAM: BILATERAL BREAST MRI WITH AND WITHOUT CONTRAST  TECHNIQUE: Multiplanar, multisequence MR images of both breasts were obtained prior to and following the intravenous administration of 19 ml of Multihance.  THREE-DIMENSIONAL MR IMAGE RENDERING ON INDEPENDENT WORKSTATION:  Three-dimensional MR images were rendered by post-processing of the original MR data on an independent workstation. The three-dimensional MR images were interpreted, and findings are reported in the following complete MRI report for this study. Three dimensional images were evaluated at the independent DynaCad workstation  COMPARISON:  Previous exam(s).  FINDINGS: Breast composition: c.  Heterogeneous fibroglandular tissue.  Background parenchymal enhancement: Mild  Right breast: Within the upper-outer right breast middle depth there is a 2.2 x 2.5 x 2.1 cm irregular enhancing mass with central susceptibility artifact. This is compatible with recently biopsy proven right breast invasive ductal carcinoma.  Approximately 5 mm anterior and superior to this mass is an additional 1.4 x 0.9 x 1.2 cm suspicious irregular enhancing mass.  Superior to the biopsied right breast mass within the upper-outer quadrant of the right breast is approximately 6.5 x 4.8 cm of clumped non mass enhancement which extends from the level of the mass throughout the upper-outer quadrant of the right breast. This is likely associated with suspicious calcifications within the upper-outer right breast.  Left breast: No mass or abnormal enhancement.  Lymph nodes: There is a cortically thickened right axillary lymph node, previously biopsied. Additionally there is a mildly cortically thickened right subpectoral lymph node (image 44; series 4). No internal mammary or left axillary adenopathy identified.  Ancillary findings:  None.  IMPRESSION: 1. Irregular enhancing mass within the upper-outer right breast middle depth compatible with  recently biopsied right breast carcinoma. 2. Immediately (  5 mm) anterior and superior to this is an additional 1.4 cm suspicious irregular enhancing mass. 3. Extensive suspicious clumped non mass enhancement within the upper-outer quadrant of the right breast, favored to correspond with suspicious calcifications on mammography and concerning for associated DCIS. 4. Mildly cortically thickened right axillary lymph node, previously biopsied. Additionally there is a mildly cortically thickened right subpectoral lymph node.  RECOMMENDATION: 1. If right breast conservation is desired, magnification views of the suspicious calcifications (upper-outer quadrant right breast) and stereotactic guided core needle biopsy of the furthest extent is recommended as these calcifications are favored to correspond with the suspicious non mass enhancement within the upper-outer quadrant of the right breast, potentially representing DCIS. If benign pathology, consider MRI guided core needle biopsy of the furthest extent of non mass enhancement.  BI-RADS CATEGORY  4: Suspicious.   Electronically Signed   By: Lovey Newcomer M.D.   On: 10/26/2014 15:16   Nm Pet Image Initial (pi) Skull Base To Thigh  11/05/2014   CLINICAL DATA:  Initial treatment strategy for right breast cancer.  EXAM: NUCLEAR MEDICINE PET SKULL BASE TO THIGH  TECHNIQUE: 10.7 mCi F-18 FDG was injected intravenously. Full-ring PET imaging was performed from the skull base to thigh after the radiotracer. CT data was obtained and used for attenuation correction and anatomic localization.  FASTING BLOOD GLUCOSE:  Value: 101 mg/dl  COMPARISON:  Right breast MRI dated 10/26/2014  FINDINGS: NECK  Small right supraclavicular nodes measuring up to 9 mm short axis (series 4/image 40), max SUV 6.1.  CHEST  2.3 x 1.7 cm soft tissue lesion in the outer quadrant of the right breast (series 4/ image 65), max SUV 6.8, corresponding to known primary breast neoplasm.  Associated small  right axillary nodes measuring up to 8 mm short axis, max SUV 2.9.  Additional small subpectoral nodes measuring 5-6 mm short axis, although without appreciable hypermetabolism.  No suspicious pulmonary nodules on CT.  ABDOMEN/PELVIS  No abnormal hypermetabolic activity within the liver, pancreas, adrenal glands, or spleen.  Punctate nonobstructing interpolar right renal calculus (series 4/image 106).  No hypermetabolic lymph nodes in the abdomen or pelvis.  SKELETON  No focal hypermetabolic activity to suggest skeletal metastasis.  IMPRESSION: 2.3 cm lesion in the outer quadrant of the right breast, corresponding to known right breast neoplasm.  Hypermetabolic right supraclavicular and right axillary nodal metastases. Additional small right subpectoral nodes, although without appreciable hypermetabolism.  No evidence of distant metastases.   Electronically Signed   By: Julian Hy M.D.   On: 11/05/2014 10:16   [place] Extrem Up Right Ltd  10/30/2014   CLINICAL DATA:  54 year old female with recently diagnosed right breast cancer. An indeterminate right axillary lymph node was recently biopsied and negative for metastatic disease, however on recent MRI the axillary lymph nodes on the right remains concerning and therefore second-look ultrasound and re-biopsy was requested.  EXAM: ULTRASOUND OF THE RIGHT AXILLA  COMPARISON:  PREVIOUS MAMMOGRAMS AND ULTRASOUND AND MRI DATED 10/26/2014  FINDINGS: Physical examination of the right axilla does not reveal any palpable masses. A large bruise is present in the upper-outer right breast at site of prior biopsy.  Targeted ultrasound of the right axilla was performed demonstrating several oval similar-appearing hypoechoic lymph nodes in the low right axilla with thickened cortices, with a representative lymph node measuring 1.2 x 0.5 x 1.1 cm.  The lymph nodes do appear similar in the right axilla when compared to remote prior mammograms.  IMPRESSION: Indeterminate right  axillary lymph nodes, 1 of which was previously biopsied and negative for metastatic disease.  RECOMMENDATION: Biopsy of a lymph node in the low right axilla will be performed today and subsequently dictated.  I have discussed the findings and recommendations with the patient. Results were also provided in writing at the conclusion of the visit. If applicable, a reminder letter will be sent to the patient regarding the next appointment.  BI-RADS CATEGORY  4: Suspicious abnormality - biopsy should be considered.   Electronically Signed   By: Everlean Alstrom M.D.   On: 10/30/2014 10:01   Dg Chest Portable 1 View  11/06/2014   CLINICAL DATA:  Port-A-Cath placement confirmation.  EXAM: PORTABLE CHEST - 1 VIEW  COMPARISON:  None.  FINDINGS: The heart size and mediastinal contours are within normal limits. Both lungs are clear. The visualized skeletal structures are unremarkable.  Port-A-Cath has been placed from LEFT subclavian approach. Tip lies in the distal SVC. No pneumothorax.  IMPRESSION: No active disease.  Satisfactory Port-A-Cath placement.   Electronically Signed   By: Rolla Flatten M.D.   On: 11/06/2014 14:00   Mm Digital Diagnostic Unilat L  10/26/2014   CLINICAL DATA:  Recent diagnosis of right breast cancer. Left breast evaluation pre MRI.  EXAM: DIGITAL DIAGNOSTIC LEFT MAMMOGRAM WITH CAD  COMPARISON:  11/21/2013 and earlier  ACR Breast Density Category c: The breast tissue is heterogeneously dense, which may obscure small masses.  FINDINGS: No suspicious mass, distortion, or microcalcifications are identified to suggest presence of malignancy.  Mammographic images were processed with CAD.  IMPRESSION: No mammographic evidence for malignancy.  RECOMMENDATION: 1. Treatment plan for known right breast cancer. 2. Diagnostic mammogram recommended in 1 year.  I have discussed the findings and recommendations with the patient. Results were also provided in writing at the conclusion of the visit. If  applicable, a reminder letter will be sent to the patient regarding the next appointment.  BI-RADS CATEGORY  1: Negative.   Electronically Signed   By: Shon Hale M.D.   On: 10/26/2014 11:28   Mm Digital Diagnostic Unilat R  11/05/2014   CLINICAL DATA:  Patient with newly diagnosed right breast IDC and DCIS. She presents today for stereotactic guided core needle biopsy of posterior extent of microcalcifications.  EXAM: DIAGNOSTIC RIGHT MAMMOGRAM POST STEREOTACTIC BIOPSY  COMPARISON:  Previous exam(s).  FINDINGS: Mammographic images were obtained following stereotactic guided biopsy of right breast microcalcifications. These demonstrate satisfactory positioning of the X shaped metal tissue marker in the posterior upper outer right breast and a minimal post biopsy change. The metal tissue marker is located approximately 5 cm posterior and slightly superior to the previous biopsy site.  IMPRESSION: Status post stereotactic guided core needle biopsy. Pathology is pending.  Final Assessment: Post Procedure Mammograms for Marker Placement   Electronically Signed   By: Andres Shad   On: 11/05/2014 16:30   Mm Digital Diagnostic Unilat R  10/28/2014   ADDENDUM REPORT: 10/28/2014 08:00  ADDENDUM: There are new microcalcifications in the upper-outer quadrant of the right breast spanning an area measuring approximately 8.2 x 5.1 x 4.7 cm.   Electronically Signed   By: Claudie Revering M.D.   On: 10/28/2014 08:00   10/28/2014   CLINICAL DATA:  Patient presents with a palpable abnormality in the lateral right breast.  EXAM: DIGITAL DIAGNOSTIC  RIGHT MAMMOGRAM WITH CAD  ULTRASOUND RIGHT BREAST  COMPARISON:  Prior exams, most recent dated 11/21/2013.  ACR Breast Density Category c: The  breast tissue is heterogeneously dense, which may obscure small masses.  FINDINGS: There is a focal irregular opacity in the lateral right breast with associated pleomorphic calcifications both of which have developed since the prior screening  mammogram.  Mammographic images were processed with CAD.  On physical exam, there is a firm non mobile mass in the lateral right breast.  Ultrasound is performed, showing a heterogeneous mostly hypoechoic partly shadowing mass in the right breast at 9 o'clock, 5 cm the nipple, measuring 2.9 cm x 1.5 cm x 1.6 cm. Margins are irregular and partly ill-defined. Next to this mass there is a normal-sized lymph node measuring 4 mm in short axis with normal hilar fat.  In the right axilla there are 3 contiguous level 1 lymph nodes, the largest measuring 11 mm x 7 mm x 7 mm in size. This has no visible internal fat.  IMPRESSION: New right breast 2.9 cm mass with pleamorphic calcifications highly suspicious for a breast malignancy. Abnormal lymph nodes, the largest measuring 11 mm, are also suspicious for metastatic adenopathy.  RECOMMENDATION: Ultrasound-guided core needle biopsy of the right breast mass as well as the largest right breast lymph node.  I have discussed the findings and recommendations with the patient. Results were also provided in writing at the conclusion of the visit. If applicable, a reminder letter will be sent to the patient regarding the next appointment.  BI-RADS CATEGORY  5: Highly suggestive of malignancy.  Electronically Signed: By: Lajean Manes M.D. On: 10/15/2014 13:20   Mm Digital Diagnostic Unilat R  10/20/2014   CLINICAL DATA:  54 year old female status post ultrasound-guided right breast and right axillary lymph node biopsies  EXAM: DIAGNOSTIC RIGHT MAMMOGRAM POST ULTRASOUND BIOPSY  COMPARISON:  Previous exam(s).  FINDINGS: Mammographic images were obtained following ultrasound guided biopsy of a suspicious right breast mass at 9 o'clock. Post biopsy images demonstrate satisfactory placement of a coil clip within the right breast mass.  IMPRESSION: Satisfactory clip placement post ultrasound-guided right breast biopsy.  Final Assessment: Post Procedure Mammograms for Marker Placement    Electronically Signed   By: Pamelia Hoit M.D.   On: 10/20/2014 13:45   Dg Fluoro Guide Cv Line-no Report  11/06/2014   CLINICAL DATA:    FLOURO GUIDE CV LINE  Fluoroscopy was utilized by the requesting physician.  No radiographic  interpretation.    US Breast Ltd Uni Right Inc Axilla  10/28/2014   ADDENDUM REPORT: 10/28/2014 08:00  ADDENDUM: There are new microcalcifications in the upper-outer quadrant of the right breast spanning an area measuring approximately 8.2 x 5.1 x 4.7 cm.   Electronically Signed   By: Claudie Revering M.D.   On: 10/28/2014 08:00   10/28/2014   CLINICAL DATA:  Patient presents with a palpable abnormality in the lateral right breast.  EXAM: DIGITAL DIAGNOSTIC  RIGHT MAMMOGRAM WITH CAD  ULTRASOUND RIGHT BREAST  COMPARISON:  Prior exams, most recent dated 11/21/2013.  ACR Breast Density Category c: The breast tissue is heterogeneously dense, which may obscure small masses.  FINDINGS: There is a focal irregular opacity in the lateral right breast with associated pleomorphic calcifications both of which have developed since the prior screening mammogram.  Mammographic images were processed with CAD.  On physical exam, there is a firm non mobile mass in the lateral right breast.  Ultrasound is performed, showing a heterogeneous mostly hypoechoic partly shadowing mass in the right breast at 9 o'clock, 5 cm the nipple, measuring 2.9 cm x 1.5 cm  x 1.6 cm. Margins are irregular and partly ill-defined. Next to this mass there is a normal-sized lymph node measuring 4 mm in short axis with normal hilar fat.  In the right axilla there are 3 contiguous level 1 lymph nodes, the largest measuring 11 mm x 7 mm x 7 mm in size. This has no visible internal fat.  IMPRESSION: New right breast 2.9 cm mass with pleamorphic calcifications highly suspicious for a breast malignancy. Abnormal lymph nodes, the largest measuring 11 mm, are also suspicious for metastatic adenopathy.  RECOMMENDATION: Ultrasound-guided  core needle biopsy of the right breast mass as well as the largest right breast lymph node.  I have discussed the findings and recommendations with the patient. Results were also provided in writing at the conclusion of the visit. If applicable, a reminder letter will be sent to the patient regarding the next appointment.  BI-RADS CATEGORY  5: Highly suggestive of malignancy.  Electronically Signed: By: Lajean Manes M.D. 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 This was found to be concordant by Dr. Robina Ade. Pathology was discussed with the patient by telephone. She reported doing well after the biopsy with tenderness at the site. Post biopsy instructions and care were reviewed and her questions were answered. 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 She is to follow her outlined treatment plan. She has my number for future questions and concerns.  Pathology results reported by Susa Raring RN, BSN on November 10, 2014.   Electronically Signed   By: Andres Shad   On: 11/10/2014 07:21   11/10/2014   CLINICAL DATA:  Patient is a 54 year old female with new diagnosis of right breast IDC and DCIS. Contrast-enhanced breast MR demonstrated abnormal non mass enhancement thought to likely correspond to the suspicious microcalcifications noted on diagnostic mammography. Patient presents for stereotactic guided core needle biopsy to document extent of disease.  EXAM: RIGHT BREAST STEREOTACTIC CORE NEEDLE BIOPSY  COMPARISON:  Previous exams.  FINDINGS: The patient and I discussed the procedure of stereotactic-guided biopsy including benefits and alternatives. We discussed the high likelihood of a  successful procedure. We discussed the risks of the procedure including infection, bleeding, tissue injury, clip migration, and inadequate sampling. Informed written consent was given. The usual time out protocol was performed immediately prior to the procedure.  Preprocedure spot-compression magnification views of the upper-outer right breast were performed demonstrating extensive coarse heterogeneous microcalcifications. Suspicious microcalcifications with linear orientation towards the right nipple are noted most posteriorly to the biopsy site of disease and will be targeted for stereotactic guided core needle biopsy.  Using sterile technique and 2% Lidocaine with and without epinephrine as local anesthetic, under stereotactic guidance, a 9 gauge vacuum assisted device was used to perform core needle biopsy of microcalcifications in the outer right breast using a cranial to caudal approach. Specimen radiograph was performed showing microcalcifications. Specimens with calcifications are identified for pathology.  At the conclusion of the procedure, a X shaped tissue marker clip was deployed into the biopsy cavity. Follow-up 2-view mammogram was performed and is dictated separately.  IMPRESSION: Stereotactic-guided biopsy of the right breast. No apparent complications. Pathology is pending.  Electronically Signed: By: Andres Shad  On: 11/05/2014 16:22   [place] Rt Breast Bx W Loc Dev 1st Lesion Img Bx Spec US Guide  11/03/2014   ADDENDUM REPORT: 11/03/2014 10:17  ADDENDUM: Pathology revealed a benign lymph node with reactive hyperplasia and sinus histiocytosis in the right axilla. This was found to be concordant by Dr. Enrique Sack. Pathology was discussed with the patient by telephone. She reported doing well after the biopsy with tenderness at the site. Post biopsy instructions and care were reviewed and her questions were answered. She has recently been diagnosed with right breast invasive ductal carcinoma. She is  scheduled for a right breast stereotactic biopsy on November 05, 2014.  Pathology results reported by Susa Raring RN, BSN on November 03, 2014.   Electronically Signed   By: Everlean Alstrom M.D.   On: 11/03/2014 10:17   11/03/2014   CLINICAL DATA:  Recently diagnosed right breast cancer with indeterminate right axillary lymph nodes, 1 of which was biopsied and negative for metastatic disease. Re-biopsy is requested.  EXAM: ULTRASOUND GUIDED RIGHT BREAST CORE NEEDLE BIOPSY  COMPARISON:  Previous exam(s).  PROCEDURE: I met with the patient and we discussed the procedure of ultrasound-guided biopsy, including benefits and alternatives. We discussed the high likelihood of a successful procedure. We discussed the risks of the procedure including infection, bleeding, tissue injury, clip migration, and inadequate sampling. Informed written consent was given. The usual time-out protocol was performed immediately prior to the procedure.  Using sterile technique and 2% Lidocaine as local anesthetic, under direct ultrasound visualization, a 12 gauge vacuum-assisted device was used to perform biopsy of an indeterminate lymph node in the lower right axillausing a lateral approach.  IMPRESSION: Ultrasound-guided biopsy of an indeterminate lymph node in the lower right axilla. No apparent complications.  Electronically Signed: By: Everlean Alstrom M.D. On: 10/30/2014 10:02   [place] Rt Breast Bx W Loc Dev 1st Lesion Img Bx Spec US Guide  10/21/2014   ADDENDUM REPORT: 10/21/2014 14:58  ADDENDUM: PATHOLOGY ADDENDUM:  Pathology  Right breast on o'clock location: Invasive ductal carcinoma. Ductal carcinoma in situ with necrosis. Lymphovascular invasion.  Lymph node right axilla:  Negative for tumor.  Pathology concordance with imaging findings: Yes  Recommendation: Consultation regarding treatment plan. The patient is scheduled for Multidisciplinary Clinic on 10/28/2014. MRI scheduled 10/26/2014. At the request of the patient, I spoke  with her by telephone on 10/21/2014. She reports doing well after the biopsy . The patient will come to the breast center for educational materials at her convenience.   Electronically Signed   By: Shon Hale M.D.   On: 10/21/2014 14:58   10/21/2014   CLINICAL DATA:  54 year old female for ultrasound-guided right axillary lymph node biopsy  EXAM: ULTRASOUND GUIDED CORE NEEDLE BIOPSY OF A RIGHT AXILLARY NODE  COMPARISON:  Previous exam(s).  FINDINGS: I met with the patient and we discussed the procedure of ultrasound-guided biopsy, including benefits and alternatives. We discussed the high likelihood of a successful procedure. We discussed the risks of the procedure, including infection, bleeding, tissue injury, clip migration, and inadequate sampling. Informed written consent was given. The usual time-out protocol was performed immediately prior to the procedure.  Using sterile technique and 2% Lidocaine as local anesthetic, under direct ultrasound visualization, a 14 gauge spring-loaded device was used to perform biopsy of an enlarged right axillary lymph node using a lateral to medial approach.  IMPRESSION: Ultrasound guided biopsy of an enlarged right axillary lymph node. No apparent complications.  Electronically Signed: By: Junie Panning  Brigitte Pulse M.D. On: 10/20/2014 13:44   [place] Rt Breast Bx W Loc Dev Ea Add Lesion Img Bx Spec US Guide  10/21/2014   ADDENDUM REPORT: 10/21/2014 14:58  ADDENDUM: PATHOLOGY ADDENDUM:  Pathology  Right breast on o'clock location: Invasive ductal carcinoma. Ductal carcinoma in situ with necrosis. Lymphovascular invasion.  Lymph node right axilla:  Negative for tumor.  Pathology concordance with imaging findings: Yes  Recommendation: Consultation regarding treatment plan. The patient is scheduled for Multidisciplinary Clinic on 10/28/2014. MRI scheduled 10/26/2014. At the request of the patient, I spoke with her by telephone on 10/21/2014. She reports doing well after the biopsy . The patient will  come to the breast center for educational materials at her convenience.   Electronically Signed   By: Shon Hale M.D.   On: 10/21/2014 14:58   10/21/2014   CLINICAL DATA:  54 year old female for ultrasound-guided biopsy of a suspicious right breast mass  EXAM: ULTRASOUND GUIDED RIGHT BREAST CORE NEEDLE BIOPSY  COMPARISON:  Previous exam(s).  PROCEDURE: I met with the patient and we discussed the procedure of ultrasound-guided biopsy, including benefits and alternatives. We discussed the high likelihood of a successful procedure. We discussed the risks of the procedure including infection, bleeding, tissue injury, clip migration, and inadequate sampling. Informed written consent was given. The usual time-out protocol was performed immediately prior to the procedure.  Using sterile technique and 2% Lidocaine as local anesthetic, under direct ultrasound visualization, a 12 gauge vacuum-assisted device was used to perform biopsy of a suspicious right breast mass at 9 o'clock using a lateral to medial approach. At the conclusion of the procedure, a coil tissue marker clip was deployed into the biopsy cavity. Follow-up 2-view mammogram was performed and dictated separately.  IMPRESSION: Ultrasound-guided biopsy of a suspicious right breast mass at 9 o'clock. No apparent complications.  Electronically Signed: By: Pamelia Hoit M.D. On: 10/20/2014 13:43    ASSESSMENT: 54 y.o. 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 Today we again went over the possible toxicities, side effects and complications. I also gave her a "roadmap" on how to take her supportive and ancillary medications. All the prescriptions have been placed and she is ready for her first dose 11/19/2014.  She had some difficulty in her meeting with our genetics counselor and generally felt overwhelmed. At this point after further discussion today however she feels ready to proceed with genetic testing. We will see if we can go ahead and get the blood drawn at her next blood draw so she will have that information before making a definitive decision regarding surgery.  She will see [place] again a week after her first treatment and then on every day 1 and day 8 of each cycle of doxorubicin and cyclophosphamide. Which she completes that we will repeat a breast MRI, and then she will start the weekly  paclitaxel and carboplatin. There will be another MRI before her definitive surgery unless there is a definitive plan for mastectomy..  The patient has a good understanding of the overall plan. She agrees with it. She knows the goal of treatment in her case is cure. She will call with any problems that may develop before her next visit here.  [name], MD   11/13/2014 4:20 PM Medical Oncology and Hematology [facility] [address] [place], [place]  [address] Tel. [phone]    Fax. [phone]

## 2014-11-16 ENCOUNTER — Encounter: Payer: Self-pay | Admitting: Oncology

## 2014-11-16 ENCOUNTER — Telehealth: Payer: Self-pay | Admitting: Oncology

## 2014-11-16 NOTE — Progress Notes (Signed)
Sent pre-auth for ondansetron mg tablets to [facility] take 1 tablet by mouth twice daily as needed. Start on the [place] after chemo

## 2014-11-16 NOTE — Progress Notes (Signed)
Ondansetron hcl tablet approved 10/17/14-11/16/15 per [facility]. Case [id number]

## 2014-11-16 NOTE — Telephone Encounter (Signed)
per pof to sch pt appt-cld & spoke to pt to adv and gave pt # to GI to call & scg brest MRI per [name] [name] for screening b4scheduling-pt understood-adv pt to get updated sch on 3/3

## 2014-11-19 ENCOUNTER — Ambulatory Visit: Payer: BC Managed Care – PPO | Admitting: Nutrition

## 2014-11-19 ENCOUNTER — Ambulatory Visit: Payer: BC Managed Care – PPO

## 2014-11-19 ENCOUNTER — Encounter: Payer: Self-pay | Admitting: *Deleted

## 2014-11-19 ENCOUNTER — Ambulatory Visit (HOSPITAL_BASED_OUTPATIENT_CLINIC_OR_DEPARTMENT_OTHER): Payer: BC Managed Care – PPO

## 2014-11-19 ENCOUNTER — Other Ambulatory Visit: Payer: Self-pay

## 2014-11-19 ENCOUNTER — Other Ambulatory Visit (HOSPITAL_BASED_OUTPATIENT_CLINIC_OR_DEPARTMENT_OTHER): Payer: BC Managed Care – PPO

## 2014-11-19 ENCOUNTER — Telehealth: Payer: Self-pay | Admitting: *Deleted

## 2014-11-19 DIAGNOSIS — C50811 Malignant neoplasm of overlapping sites of right female breast: Secondary | ICD-10-CM

## 2014-11-19 DIAGNOSIS — C50411 Malignant neoplasm of upper-outer quadrant of right female breast: Secondary | ICD-10-CM

## 2014-11-19 DIAGNOSIS — Z5111 Encounter for antineoplastic chemotherapy: Secondary | ICD-10-CM

## 2014-11-19 DIAGNOSIS — I10 Essential (primary) hypertension: Secondary | ICD-10-CM

## 2014-11-19 DIAGNOSIS — G35 Multiple sclerosis: Secondary | ICD-10-CM

## 2014-11-19 LAB — COMPREHENSIVE METABOLIC PANEL (CC13)
ALT: 12 U/L (ref 0–55)
AST: 15 U/L (ref 5–34)
Albumin: 4 g/dL (ref 3.5–5.0)
Alkaline Phosphatase: 87 U/L (ref 40–150)
Anion Gap: 12 mEq/L — ABNORMAL HIGH (ref 3–11)
BUN: 8.3 mg/dL (ref 7.0–26.0)
CO2: 25 meq/L (ref 22–29)
Calcium: 9.6 mg/dL (ref 8.4–10.4)
Chloride: 106 mEq/L (ref 98–109)
Creatinine: 0.9 mg/dL (ref 0.6–1.1)
EGFR: 90 mL/min/{1.73_m2} — AB (ref >90)
GLUCOSE: 121 mg/dL (ref 70–140)
Potassium: 3.2 mEq/L — ABNORMAL LOW (ref 3.5–5.1)
Sodium: 143 mEq/L (ref 136–145)
TOTAL PROTEIN: 7.3 g/dL (ref 6.4–8.3)
Total Bilirubin: 0.76 mg/dL (ref 0.20–1.20)

## 2014-11-19 LAB — CBC WITH DIFFERENTIAL/PLATELET
BASO%: 0.5 % (ref 0.0–2.0)
Basophils Absolute: 0 10*3/uL (ref 0.0–0.1)
EOS ABS: 0.2 10*3/uL (ref 0.0–0.5)
EOS%: 3.3 % (ref 0.0–7.0)
HCT: 38.3 % (ref 34.8–46.6)
HGB: 12.5 g/dL (ref 11.6–15.9)
LYMPH#: 2 10*3/uL (ref 0.9–3.3)
LYMPH%: 31.8 % (ref 14.0–49.7)
MCH: 27.9 pg (ref 25.1–34.0)
MCHC: 32.7 g/dL (ref 31.5–36.0)
MCV: 85.4 fL (ref 79.5–101.0)
MONO#: 0.4 10*3/uL (ref 0.1–0.9)
MONO%: 6.2 % (ref 0.0–14.0)
NEUT#: 3.7 10*3/uL (ref 1.5–6.5)
NEUT%: 58.2 % (ref 38.4–76.8)
PLATELETS: 286 10*3/uL (ref 145–400)
RBC: 4.48 10*6/uL (ref 3.70–5.45)
RDW: 15.2 % — ABNORMAL HIGH (ref 11.2–14.5)
WBC: 6.4 10*3/uL (ref 3.9–10.3)

## 2014-11-19 MED ORDER — PALONOSETRON HCL INJECTION 0.25 MG/5ML
0.2500 mg | Freq: Once | INTRAVENOUS | Status: AC
  Administered 2014-11-19: 0.25 mg via INTRAVENOUS

## 2014-11-19 MED ORDER — DEXAMETHASONE SODIUM PHOSPHATE 20 MG/5ML IJ SOLN
12.0000 mg | Freq: Once | INTRAMUSCULAR | Status: AC
  Administered 2014-11-19: 12 mg via INTRAVENOUS

## 2014-11-19 MED ORDER — SODIUM CHLORIDE 0.9 % IV SOLN
600.0000 mg/m2 | Freq: Once | INTRAVENOUS | Status: AC
  Administered 2014-11-19: 1300 mg via INTRAVENOUS
  Filled 2014-11-19: qty 65

## 2014-11-19 MED ORDER — HEPARIN SOD (PORK) LOCK FLUSH 100 UNIT/ML IV SOLN
500.0000 [IU] | Freq: Once | INTRAVENOUS | Status: AC | PRN
  Administered 2014-11-19: 500 [IU]
  Filled 2014-11-19: qty 5

## 2014-11-19 MED ORDER — PALONOSETRON HCL INJECTION 0.25 MG/5ML
INTRAVENOUS | Status: AC
  Filled 2014-11-19: qty 5

## 2014-11-19 MED ORDER — POTASSIUM CHLORIDE ER 10 MEQ PO CPCR: 20.0000 meq | ORAL_CAPSULE | Freq: Two times a day (BID) | ORAL | Status: DC

## 2014-11-19 MED ORDER — SODIUM CHLORIDE 0.9 % IV SOLN
150.0000 mg | Freq: Once | INTRAVENOUS | Status: AC
  Administered 2014-11-19: 150 mg via INTRAVENOUS
  Filled 2014-11-19: qty 5

## 2014-11-19 MED ORDER — DEXAMETHASONE SODIUM PHOSPHATE 20 MG/5ML IJ SOLN
INTRAMUSCULAR | Status: AC
  Filled 2014-11-19: qty 5

## 2014-11-19 MED ORDER — SODIUM CHLORIDE 0.9 % IJ SOLN
10.0000 mL | INTRAMUSCULAR | Status: DC | PRN
  Administered 2014-11-19: 10 mL
  Filled 2014-11-19: qty 10

## 2014-11-19 MED ORDER — DOXORUBICIN HCL CHEMO IV INJECTION 2 MG/ML
60.0000 mg/m2 | Freq: Once | INTRAVENOUS | Status: AC
  Administered 2014-11-19: 130 mg via INTRAVENOUS
  Filled 2014-11-19: qty 65

## 2014-11-19 MED ORDER — SODIUM CHLORIDE 0.9 % IV SOLN
Freq: Once | INTRAVENOUS | Status: AC
  Administered 2014-11-19: 13:00:00 via INTRAVENOUS

## 2014-11-19 NOTE — Progress Notes (Signed)
Dr.Magrinat's nurse, Val, notified per Vivien Presto RN on pt's potassium level of 3.2. Val to notify Dr.Magrinat.

## 2014-11-19 NOTE — Patient Instructions (Signed)
[facility] Discharge Instructions for Patients Receiving Chemotherapy  Today you received the following chemotherapy agents adriamycin/Cytoxan.  To help prevent nausea and vomiting after your treatment, we encourage you to take your nausea medication as directed.    If you develop nausea and vomiting that is not controlled by your nausea medication, call the clinic.   BELOW ARE SYMPTOMS THAT SHOULD BE REPORTED IMMEDIATELY:  *FEVER GREATER THAN 100.5 F  *CHILLS WITH OR WITHOUT FEVER  NAUSEA AND VOMITING THAT IS NOT CONTROLLED WITH YOUR NAUSEA MEDICATION  *UNUSUAL SHORTNESS OF BREATH  *UNUSUAL BRUISING OR BLEEDING  TENDERNESS IN MOUTH AND THROAT WITH OR WITHOUT PRESENCE OF ULCERS  *URINARY PROBLEMS  *BOWEL PROBLEMS  UNUSUAL RASH Items with * indicate a potential emergency and should be followed up as soon as possible.  Feel free to call the clinic you have any questions or concerns. The clinic phone number is [phone].

## 2014-11-19 NOTE — Progress Notes (Signed)
Spoke with patient today at her 1st chemo treatment.  She is doing well.  Confirmed her appointments for next week which she was not aware of, for labs and to see Heather.  Informed her I would follow up with her next week at her visit with [facility].  Encouraged her to cal with any needs or concerns.

## 2014-11-19 NOTE — Telephone Encounter (Signed)
Mild decrease in potassium level called by infusion room nurse.  Noted pt is on HCTZ but no potassium on medication list.  Per MD review recommended supplement with micro K and use of potassium rich foods.  This RN called to pt's home and was informed pt not there at this time.  Call placed to cell number and obtained VM- message left to return call to nurse ( recommended triage RN ) to discuss lab and need for medication.   Prescription for Micro K sent to pharmacy.

## 2014-11-19 NOTE — Progress Notes (Signed)
54 year old female diagnosed AAA negative breast cancer.  She is a patient of Dr. Jana Hakim  Past medical history includes metabolic syndrome X, obesity, MS, hypertension, hyperlipidemia, GERD, anxiety.  Medications include Xanax, and Protonix.  Labs include glucose of 101, and albumin 4.  Height: 66 inches. Weight: 216 pounds. Usual body weight: 220 pounds. BMI: 34.88.  Patient is receiving neoadjuvant chemotherapy to be followed by surgery and radiation therapy. She is receiving her first chemotherapy treatment today and has some anxiety. She would like to have information on healthy diet.  Nutrition diagnosis: Food and nutrition related knowledge deficit related to new diagnosis of breast cancer as evidenced by no prior need for nutrition related information.  Intervention:  Patient educated to consume plant-based diet which is low in fat to promote maintenance of lean body mass. Suggested patient should try smaller more frequent meals and snacks consisting of high protein foods. Brief education given on nutrition impact symptoms and dietary changes. Enforced importance of 30 minutes of daily physical activity. Fact sheets were provided.  Teach back method was used.  Questions were answered. Patient has my contact information for questions or concerns.  Intervention: Patient will tolerate a healthy plant-based diet to maintain lean body mass.  Nutrition diagnosis resolved.  **Disclaimer: This note was dictated with voice recognition software. Similar sounding words can inadvertently be transcribed and this note may contain transcription errors which may not have been corrected upon publication of note.**

## 2014-11-20 ENCOUNTER — Ambulatory Visit (HOSPITAL_BASED_OUTPATIENT_CLINIC_OR_DEPARTMENT_OTHER): Payer: BC Managed Care – PPO

## 2014-11-20 ENCOUNTER — Telehealth: Payer: Self-pay | Admitting: *Deleted

## 2014-11-20 DIAGNOSIS — Z5189 Encounter for other specified aftercare: Secondary | ICD-10-CM

## 2014-11-20 DIAGNOSIS — C50411 Malignant neoplasm of upper-outer quadrant of right female breast: Secondary | ICD-10-CM

## 2014-11-20 DIAGNOSIS — C50811 Malignant neoplasm of overlapping sites of right female breast: Secondary | ICD-10-CM

## 2014-11-20 MED ORDER — PEGFILGRASTIM INJECTION 6 MG/0.6ML ~~LOC~~
6.0000 mg | PREFILLED_SYRINGE | Freq: Once | SUBCUTANEOUS | Status: AC
  Administered 2014-11-20: 6 mg via SUBCUTANEOUS
  Filled 2014-11-20: qty 0.6

## 2014-11-20 NOTE — Telephone Encounter (Signed)
This RN spoke with pt per need to implement prescription supplement for potassium.  Discussed use of micro K.  Pt verbalized understanding.  Pt also states she had mild headache upon waking this am and inquired what OTC meds she is allowed to take.  This RN informed pt she may take tylenol and benadryl if needed for headache post chemo.

## 2014-11-25 ENCOUNTER — Encounter: Payer: Self-pay | Admitting: Internal Medicine

## 2014-11-25 ENCOUNTER — Ambulatory Visit (INDEPENDENT_AMBULATORY_CARE_PROVIDER_SITE_OTHER): Payer: BC Managed Care – PPO | Admitting: Internal Medicine

## 2014-11-25 VITALS — BP 112/74 | HR 80 | Temp 98.6°F | Resp 16 | Wt 216.0 lb

## 2014-11-25 DIAGNOSIS — I10 Essential (primary) hypertension: Secondary | ICD-10-CM

## 2014-11-25 DIAGNOSIS — C50411 Malignant neoplasm of upper-outer quadrant of right female breast: Secondary | ICD-10-CM

## 2014-11-25 DIAGNOSIS — F419 Anxiety disorder, unspecified: Secondary | ICD-10-CM

## 2014-11-25 MED ORDER — ALPRAZOLAM 0.5 MG PO TABS: 0.5000 mg | ORAL_TABLET | Freq: Three times a day (TID) | ORAL | Status: DC | PRN

## 2014-11-25 MED ORDER — HYDROCHLOROTHIAZIDE 25 MG PO TABS: 25.0000 mg | ORAL_TABLET | Freq: Every day | ORAL | Status: DC

## 2014-11-25 MED ORDER — LIDOCAINE-PRILOCAINE 2.5-2.5 % EX CREA: TOPICAL_CREAM | CUTANEOUS | Status: DC

## 2014-11-25 MED ORDER — GLATIRAMER ACETATE 40 MG/ML ~~LOC~~ SOSY: 40.0000 mg | PREFILLED_SYRINGE | SUBCUTANEOUS | Status: DC

## 2014-11-25 MED ORDER — PANTOPRAZOLE SODIUM 40 MG PO TBEC: 40.0000 mg | DELAYED_RELEASE_TABLET | Freq: Every day | ORAL | Status: DC

## 2014-11-25 MED ORDER — DEXAMETHASONE 4 MG PO TABS: ORAL_TABLET | ORAL | Status: DC

## 2014-11-25 NOTE — Progress Notes (Signed)
Subjective:    Patient ID: [name] [name], female    DOB: 1961-07-19, 54 y.o.   MRN: [id number]  HPI  Patient here for follow-up, reviewed interval medical events since last visit. Specifically, diagnosis of breast cancer February 2016  Past Medical History  Diagnosis Date  . METABOLIC SYNDROME X   . OBESITY, TRUNCAL   . CONSTIPATION, CHRONIC   . KELOID   . Multiple sclerosis   . Allergic rhinitis due to other allergen   . HYPERTENSION   . HYPERLIPIDEMIA   . GERD   . Angioedema     ACEI  . Colon polyps 05/2014    adenomatous, colo q 5y  . Migraine headache   . Allergy   . Anxiety   . Breast cancer of upper-outer quadrant of right female breast 10/23/2014  . Hot flashes     Review of Systems  Constitutional: Negative for fever and fatigue.  Respiratory: Negative for cough and shortness of breath.   Neurological: Negative for weakness and numbness.  Psychiatric/Behavioral: Positive for sleep disturbance. Negative for suicidal ideas, confusion, dysphoric mood and decreased concentration. The patient is nervous/anxious (occassionally).        Objective:    Physical Exam  Constitutional: She appears well-developed and well-nourished. No distress.  Cardiovascular: Normal rate, regular rhythm and normal heart sounds.   No murmur heard. Pulmonary/Chest: Effort normal and breath sounds normal. No respiratory distress.  Musculoskeletal: She exhibits no edema.  Psychiatric: She has a normal mood and affect. Her behavior is normal. Judgment and thought content normal.    BP 112/74 mmHg  Pulse 80  Temp(Src) 98.6 F (37 C) (Oral)  Resp 16  Wt 216 lb (97.977 kg)  SpO2 97%  LMP 11/01/2012 Wt Readings from Last 3 Encounters:  11/25/14 216 lb (97.977 kg)  11/13/14 216 lb (97.977 kg)  11/03/14 216 lb (97.977 kg)     Lab Results  Component Value Date   WBC 6.4 11/19/2014   HGB 12.5 11/19/2014   HCT 38.3 11/19/2014   PLT 286 11/19/2014   GLUCOSE 121 11/19/2014   CHOL 196 01/14/2014   TRIG 206.0* 01/14/2014   HDL 45.50 01/14/2014   LDLDIRECT 122.7 11/06/2011   LDLCALC 109* 01/14/2014   ALT 12 11/19/2014   AST 15 11/19/2014   NA 143 11/19/2014   K 3.2* 11/19/2014   CL 102 01/14/2014   CREATININE 0.9 11/19/2014   BUN 8.3 11/19/2014   CO2 25 11/19/2014   TSH 1.86 01/14/2014   HGBA1C 5.9 12/30/2007    Nm Pet Image Initial (pi) Skull Base To Thigh  11/05/2014   CLINICAL DATA:  Initial treatment strategy for right breast cancer.  EXAM: NUCLEAR MEDICINE PET SKULL BASE TO THIGH  TECHNIQUE: 10.7 mCi F-18 FDG was injected intravenously. Full-ring PET imaging was performed from the skull base to thigh after the radiotracer. CT data was obtained and used for attenuation correction and anatomic localization.  FASTING BLOOD GLUCOSE:  Value: 101 mg/dl  COMPARISON:  Right breast MRI dated 10/26/2014  FINDINGS: NECK  Small right supraclavicular nodes measuring up to 9 mm short axis (series 4/image 40), max SUV 6.1.  CHEST  2.3 x 1.7 cm soft tissue lesion in the outer quadrant of the right breast (series 4/ image 65), max SUV 6.8, corresponding to known primary breast neoplasm.  Associated small right axillary nodes measuring up to 8 mm short axis, max SUV 2.9.  Additional small subpectoral nodes measuring 5-6 mm short axis, although without appreciable hypermetabolism.  No suspicious pulmonary nodules on CT.  ABDOMEN/PELVIS  No abnormal hypermetabolic activity within the liver, pancreas, adrenal glands, or spleen.  Punctate nonobstructing interpolar right renal calculus (series 4/image 106).  No hypermetabolic lymph nodes in the abdomen or pelvis.  SKELETON  No focal hypermetabolic activity to suggest skeletal metastasis.  IMPRESSION: 2.3 cm lesion in the outer quadrant of the right breast, corresponding to known right breast neoplasm.  Hypermetabolic right supraclavicular and right axillary nodal metastases. Additional small right subpectoral nodes, although without  appreciable hypermetabolism.  No evidence of distant metastases.   Electronically Signed   By: Julian Hy M.D.   On: 11/05/2014 10:16   Dg Chest Portable 1 View  11/06/2014   CLINICAL DATA:  Port-A-Cath placement confirmation.  EXAM: PORTABLE CHEST - 1 VIEW  COMPARISON:  None.  FINDINGS: The heart size and mediastinal contours are within normal limits. Both lungs are clear. The visualized skeletal structures are unremarkable.  Port-A-Cath has been placed from LEFT subclavian approach. Tip lies in the distal SVC. No pneumothorax.  IMPRESSION: No active disease.  Satisfactory Port-A-Cath placement.   Electronically Signed   By: Rolla Flatten M.D.   On: 11/06/2014 14:00   Mm Digital Diagnostic Unilat R  11/05/2014   CLINICAL DATA:  Patient with newly diagnosed right breast IDC and DCIS. She presents today for stereotactic guided core needle biopsy of posterior extent of microcalcifications.  EXAM: DIAGNOSTIC RIGHT MAMMOGRAM POST STEREOTACTIC BIOPSY  COMPARISON:  Previous exam(s).  FINDINGS: Mammographic images were obtained following stereotactic guided biopsy of right breast microcalcifications. These demonstrate satisfactory positioning of the X shaped metal tissue marker in the posterior upper outer right breast and a minimal post biopsy change. The metal tissue marker is located approximately 5 cm posterior and slightly superior to the previous biopsy site.  IMPRESSION: Status post stereotactic guided core needle biopsy. Pathology is pending.  Final Assessment: Post Procedure Mammograms for Marker Placement   Electronically Signed   By: Andres Shad   On: 11/05/2014 16:30   Dg Fluoro Guide Cv Line-no Report  11/06/2014   CLINICAL DATA:    FLOURO GUIDE CV LINE  Fluoroscopy was utilized by the requesting physician.  No radiographic  interpretation.    Mm Rt Breast Bx W Loc Dev 1st Lesion Image Bx Spec Stereo Guide  11/10/2014   ADDENDUM REPORT: 11/10/2014 07:21  ADDENDUM: Pathology revealed  microinvasive ductal carcinoma with associated high grade ductal carcinoma in situ with necrosis and calcifications in the upper outer right breast. This was found to be concordant by Dr. Robina Ade. Pathology was discussed with the patient by telephone. She reported doing well after the biopsy with tenderness at the site. Post biopsy instructions and care were reviewed and her questions were answered. 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 She is to follow her outlined treatment plan. She has my number for future questions and concerns.  Pathology results reported by Susa Raring RN, BSN on November 10, 2014.   Electronically Signed   By: Andres Shad   On: 11/10/2014 07:21   11/10/2014   CLINICAL DATA:  Patient is a 54 year old female with new diagnosis of right breast IDC and DCIS. Contrast-enhanced breast MR demonstrated abnormal non mass enhancement thought to likely correspond to the suspicious microcalcifications noted on diagnostic mammography. Patient presents for stereotactic guided core needle biopsy to document extent of disease.  EXAM: RIGHT BREAST STEREOTACTIC CORE NEEDLE BIOPSY  COMPARISON:  Previous exams.  FINDINGS: The patient and I discussed the procedure of stereotactic-guided biopsy including benefits and alternatives. We discussed the high likelihood of a successful procedure. We discussed the risks of the procedure including infection, bleeding, tissue injury, clip migration, and inadequate sampling. Informed written consent was given. The usual time out protocol was performed immediately prior to the procedure.  Preprocedure spot-compression magnification views of the upper-outer right breast were performed demonstrating extensive coarse heterogeneous microcalcifications. Suspicious microcalcifications with linear orientation towards the right nipple are noted most posteriorly to the biopsy site of disease and will be targeted for stereotactic  guided core needle biopsy.  Using sterile technique and 2% Lidocaine with and without epinephrine as local anesthetic, under stereotactic guidance, a 9 gauge vacuum assisted device was used to perform core needle biopsy of microcalcifications in the outer right breast using a cranial to caudal approach. Specimen radiograph was performed showing microcalcifications. Specimens with calcifications are identified for pathology.  At the conclusion of the procedure, a X shaped tissue marker clip was deployed into the biopsy cavity. Follow-up 2-view mammogram was performed and is dictated separately.  IMPRESSION: Stereotactic-guided biopsy of the right breast. No apparent complications. Pathology is pending.  Electronically Signed: By: Andres Shad On: 11/05/2014 16:22       Assessment & Plan:   Problem List Items Addressed This Visit    Anxiety    Uses low dose xanax as needed - will refill same given situational stressors of new cancer dx 10/2014 Discussed potential need for SSRI if BZ "need" is daily - but doing well at this time Never filled or started low dose sertraline as recommended 04/2013      Relevant Medications   ALPRAZolam (XANAX) tablet   Breast cancer of upper-outer quadrant of right female breast - Primary    Dx 10/2014 - working with onc and surg for same right breast upper outer quadrant biopsy 10/20/2014 for a clinical T2 N0, stage IIA invasive ductal carcinoma, grade 3, triple negative, with an MIB-1 of 71% (a) right axillary lymph node biopsy negative 10/30/2014 (b) biopsy of posterior extent of right breast calcifications show ductal carcinoma is situ, high grade, with microinvasion  (1) genetics testing pending (pt to follow up with same)  2) neoadjuvant chemotherapy to consist of cyclophosphamide and doxorubicin in dose dense fashion 4 given with Neulasta support, followed by weekly carboplatin and paclitaxel 12  (3) definitive surgery to follow  chemotherapy  (4) adjuvant radiation, if appropriate, to follow surgery      Relevant Medications   dexamethasone (DECADRON) tablet   ALPRAZolam (XANAX) tablet   Essential hypertension    BP Readings from Last 3 Encounters:  11/25/14 112/74  11/19/14 135/72  11/13/14 143/58  often exacerbated by stress - currently well controlled The current medical regimen is effective;  continue present plan and medications.       Relevant Medications   hydrochlorothiazide tablet     Time spent with pt today 25 minutes, greater than 50% time spent counseling patient on new diagnosis breast cancer February 2016, ongoing plans for chemotherapy and head of surgery and adjunctive radiation, associated situational anxiety and medication review. Also review of interval records   Gwendolyn Grant, MD

## 2014-11-25 NOTE — Patient Instructions (Addendum)
It was good to see you today.  We have reviewed your prior records including labs and tests today  Medications reviewed and updated, no changes recommended at this time. Refill on medication(s) as discussed today.  Ask your oncology providers about need for flu and/or pneumonia vaccinations  Please schedule followup in 3-4 months, call sooner if problems.

## 2014-11-25 NOTE — Assessment & Plan Note (Addendum)
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

## 2014-11-25 NOTE — Progress Notes (Signed)
Pre visit review using our clinic review tool, if applicable. No additional management support is needed unless otherwise documented below in the visit note. 

## 2014-11-25 NOTE — Assessment & Plan Note (Signed)
Uses low dose xanax as needed - will refill same given situational stressors of new cancer dx 10/2014 Discussed potential need for SSRI if BZ "need" is daily - but doing well at this time Never filled or started low dose sertraline as recommended 04/2013

## 2014-11-25 NOTE — Assessment & Plan Note (Signed)
BP Readings from Last 3 Encounters:  11/25/14 112/74  11/19/14 135/72  11/13/14 143/58  often exacerbated by stress - currently well controlled The current medical regimen is effective;  continue present plan and medications.

## 2014-11-26 ENCOUNTER — Ambulatory Visit (HOSPITAL_BASED_OUTPATIENT_CLINIC_OR_DEPARTMENT_OTHER): Payer: BC Managed Care – PPO | Admitting: Nurse Practitioner

## 2014-11-26 ENCOUNTER — Other Ambulatory Visit (HOSPITAL_BASED_OUTPATIENT_CLINIC_OR_DEPARTMENT_OTHER): Payer: BC Managed Care – PPO

## 2014-11-26 ENCOUNTER — Encounter: Payer: Self-pay | Admitting: Oncology

## 2014-11-26 ENCOUNTER — Encounter: Payer: Self-pay | Admitting: Nurse Practitioner

## 2014-11-26 VITALS — BP 117/67 | HR 75 | Temp 98.8°F | Resp 19 | Ht 66.0 in | Wt 215.1 lb

## 2014-11-26 DIAGNOSIS — G35 Multiple sclerosis: Secondary | ICD-10-CM

## 2014-11-26 DIAGNOSIS — D701 Agranulocytosis secondary to cancer chemotherapy: Secondary | ICD-10-CM

## 2014-11-26 DIAGNOSIS — I10 Essential (primary) hypertension: Secondary | ICD-10-CM

## 2014-11-26 DIAGNOSIS — T451X5A Adverse effect of antineoplastic and immunosuppressive drugs, initial encounter: Secondary | ICD-10-CM

## 2014-11-26 DIAGNOSIS — G609 Hereditary and idiopathic neuropathy, unspecified: Secondary | ICD-10-CM

## 2014-11-26 DIAGNOSIS — G4700 Insomnia, unspecified: Secondary | ICD-10-CM

## 2014-11-26 DIAGNOSIS — C50811 Malignant neoplasm of overlapping sites of right female breast: Secondary | ICD-10-CM

## 2014-11-26 DIAGNOSIS — C50411 Malignant neoplasm of upper-outer quadrant of right female breast: Secondary | ICD-10-CM

## 2014-11-26 LAB — CBC WITH DIFFERENTIAL/PLATELET
BASO%: 0.6 % (ref 0.0–2.0)
Basophils Absolute: 0 10*3/uL (ref 0.0–0.1)
EOS ABS: 0 10*3/uL (ref 0.0–0.5)
EOS%: 1.8 % (ref 0.0–7.0)
HCT: 36.7 % (ref 34.8–46.6)
HGB: 12.6 g/dL (ref 11.6–15.9)
LYMPH#: 1.2 10*3/uL (ref 0.9–3.3)
LYMPH%: 72.4 % — ABNORMAL HIGH (ref 14.0–49.7)
MCH: 28.6 pg (ref 25.1–34.0)
MCHC: 34.3 g/dL (ref 31.5–36.0)
MCV: 83.4 fL (ref 79.5–101.0)
MONO#: 0.1 10*3/uL (ref 0.1–0.9)
MONO%: 6.7 % (ref 0.0–14.0)
NEUT%: 18.5 % — AB (ref 38.4–76.8)
NEUTROS ABS: 0.3 10*3/uL — AB (ref 1.5–6.5)
Platelets: 121 10*3/uL — ABNORMAL LOW (ref 145–400)
RBC: 4.4 10*6/uL (ref 3.70–5.45)
RDW: 14.1 % (ref 11.2–14.5)
WBC: 1.6 10*3/uL — AB (ref 3.9–10.3)

## 2014-11-26 MED ORDER — CIPROFLOXACIN HCL 500 MG PO TABS: 500.0000 mg | ORAL_TABLET | Freq: Two times a day (BID) | ORAL | Status: DC

## 2014-11-26 NOTE — Progress Notes (Signed)
Pt brought in disability form, copy brought to [facility] to fill out. Note left for Ebony to call pt when ready, to leave a copy at registration to pick up.

## 2014-11-26 NOTE — Progress Notes (Signed)
I will place disabl forms on the desk of Dr. Jana Hakim.   Disability RMS

## 2014-11-26 NOTE — Progress Notes (Signed)
[name] [name]  [phone] [phone]     ID: [name] [name] DOB: 08-19-1961  MR#: [id number]  KGY#:[id number]  Patient Care Team: [name], MD as PCP - General [name], MD as Consulting Physician (Obstetrics and Gynecology) [name], MD (Gastroenterology) [name]. [name], MD (Neurology) [name], MD as Consulting Physician (General Surgery) [name], MD as Consulting Physician (Oncology) [name], MD as Attending Physician (Radiation Oncology) [name], RN as Registered Nurse [name], RN as Registered Nurse OTHER MD:  CHIEF COMPLAINT: Triple negative breast cancer  CURRENT TREATMENT: Neoadjuvant chemotherapy   BREAST CANCER HISTORY: From the original intake note:  [name] [name] had bilateral screening mammography at the [facility] 11/21/2013. Breast density was category C. Exam was unremarkable. In January 2016 however the patient palpated a change in her right breast laterally. She contacted her gynecologist, Dr. [name], and he set her up for a unilateral right diagnostic mammography with right breast ultrasonography at [facility] 10/15/2014. There was a focal irregular opacity in the lateral right breast associated with pleomorphic calcifications. This was palpable on exam as a firm non-mobile mass. Ultrasound confirmed a hypoechoic mass in the right breast at the 9:00 position 5 cm from the nipple, measuring 2.9 cm. Next to this mass was a normal-sized intramammary lymph node measuring 4 mm. In the right axilla there were 3 contiguous level I lymph nodes the largest measuring 11 mm.  On 10/20/2014 the patient underwent right core needle biopsy of the 9:00 breast mass, showing ([id number]) and invasive ductal carcinoma, grade 3, triple negative, with the HER-2/neu signals ratio of 1.26 and number per cell 2.15. The MIB-1 was 71%. Biopsy of the largest right axillary lymph node was negative.  Dr. Owens Shark the mammographer who performed a biopsy describes this is concordant.  Left breast mammography 10/26/2014 was negative, and bilateral breast MRI the same day confirmed, in the right breast, and upper outer quadrant mass measuring 2.5 cm, with a second mass measuring 1.4 cm slightly anterior and superior to it. In addition there was a total area of approximately 6.5 cm of non-masslike enhancement extending throughout the upper outer quadrant of the right breast. There was a cortically thickened right axillary lymph node which had been biopsied. There was also a mildly cortically thickened right subpectoral lymph node. There were no internal mammary or left axillary lymph nodes in the left breast was unremarkable.  The patient's subsequent history is as detailed below.  INTERVAL HISTORY: [name] [name] returns today for follow up of her breast cancer. Today is day 1, cycle 1 of cyclophosphamide and doxorubicin, with neulasta given on day 2 for granulocyte support.   REVIEW OF SYSTEMS: [name] [name] has had an excellent time with her first cycle of chemotherapy. She denies fevers or chills. She had no nausea or bowel issues. She developed no pain after the neulasta injection. Her energy level and appetite remained healthy. She has a baseline of insomnia, but it was no worse on the steroids. The baseline numbness to her fingertips is no worse. A detailed review of systems is otherwise entirely negative.   PAST MEDICAL HISTORY: Past Medical History  Diagnosis Date  . METABOLIC SYNDROME X   . OBESITY, TRUNCAL   . CONSTIPATION, CHRONIC   . KELOID   . Multiple sclerosis   . Allergic rhinitis due to other allergen   . HYPERTENSION   . HYPERLIPIDEMIA   . GERD   . Angioedema  ACEI  . Colon polyps 05/2014    adenomatous, colo q 5y  . Migraine headache   . Allergy   . Anxiety   . Breast cancer of upper-outer quadrant of right female breast 10/23/2014  . Hot flashes     PAST SURGICAL HISTORY: Past  Surgical History  Procedure Laterality Date  . Tubal ligation    . Colonoscopy    . Portacath placement Left 11/06/2014    Procedure: INSERTION PORT-A-CATH;  Surgeon: [name], MD;  Location: [facility];  Service: General;  Laterality: Left;    FAMILY HISTORY Family History  Problem Relation Age of Onset  . Coronary artery disease Mother   . Heart disease Mother   . Diabetes Mother   . Hypertension Other   . Hyperlipidemia Other   . Diabetes Other   . Colon cancer Neg Hx   . Alcohol abuse Father   . COPD Maternal Grandmother    the patient's father died at the age of 93 following his seizure. The patient's mother died from congestive 52 failure at the age of 21. The patient had 5 brothers and 2 sisters. One brother died from Escherichia coli infection, the other one had a history of seizure disorder. One sister died from causes unknown to the patient. There is no history of breast or ovarian cancer in the family.  GYNECOLOGIC HISTORY:  Patient's last menstrual period was 11/01/2012. Menarche age 54, first live birth age 68. The patient is GX P2. She stopped having periods in May 2014. She did not take hormone replacement. She did take her control pills remotely for approximately 4 years with no complications.  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 Her daughter [name] [name] lives in [place] and works in Ambulance person for [employer]. Son [name] [name] is an Engineer, building services for [employer]: Not in place   HEALTH MAINTENANCE: History  Substance Use Topics  . Smoking status: Never Smoker   . Smokeless tobacco: Never Used     Comment: Single- brother staying with pt since mom's death  . Alcohol Use: No     Colonoscopy: September 2015/[name]  PAP: February 2015  Bone density:  Lipid  panel:  Allergies  Allergen Reactions  . Ace Inhibitors Anaphylaxis    REACTION: Angioedema (tongue swelling)  . Clarithromycin Anaphylaxis    Throat swells  . Levaquin [Levofloxacin] Other (See Comments)    Tendon pain - shoulder and calf    Current Outpatient Prescriptions  Medication Sig Dispense Refill  . ALPRAZolam (XANAX) 0.5 MG tablet Take 1 tablet (0.5 mg total) by mouth 3 (three) times daily as needed for anxiety or sleep. 30 tablet 1  . dexamethasone (DECADRON) 4 MG tablet Take 2 tablets by mouth once a day on the day after chemotherapy and then take 2 tablets two times a day for 2 days. Take with food. 30 tablet 1  . Glatiramer Acetate (COPAXONE) 40 MG/ML SOSY Inject 40 mg into the skin 3 (three) times a week. 12 Syringe 0  . hydrochlorothiazide (HYDRODIURIL) 25 MG tablet Take 1 tablet (25 mg total) by mouth daily. 90 tablet 1  . lidocaine-prilocaine (EMLA) cream Apply to affected area once 30 g 3  . loratadine-pseudoephedrine (CLARITIN-D 12 HOUR) 5-120 MG per tablet Take 1 tablet by mouth as needed for allergies. 30 tablet 3  . losartan (  COZAAR) 50 MG tablet Take 1 tablet (50 mg total) by mouth 2 (two) times daily. 180 tablet 3  . metoprolol (LOPRESSOR) 50 MG tablet Take 1 tablet (50 mg total) by mouth 2 (two) times daily. 180 tablet 1  . ondansetron (ZOFRAN) 8 MG tablet Take 1 tablet (8 mg total) by mouth 2 (two) times daily as needed. Start on the third day after chemotherapy. 30 tablet 1  . pantoprazole (PROTONIX) 40 MG tablet Take 1 tablet (40 mg total) by mouth daily. 90 tablet 1  . potassium chloride (MICRO-K) 10 MEQ CR capsule Take 2 capsules (20 mEq total) by mouth 2 (two) times daily. 60 capsule 1  . prochlorperazine (COMPAZINE) 10 MG tablet Take 1 tablet (10 mg total) by mouth every 6 (six) hours as needed (Nausea or vomiting). 30 tablet 1  . ciprofloxacin (CIPRO) 500 MG tablet Take 1 tablet (500 mg total) by mouth 2 (two) times daily. 10 tablet 0  . [DISCONTINUED]  mometasone (NASONEX) 50 MCG/ACT nasal spray Place 2 sprays into the nose daily. 17 g 2   No current facility-administered medications for this visit.    OBJECTIVE: Middle-aged African-American woman who appears well Filed Vitals:   11/26/14 0951  BP: 117/67  [name]: 75  Temp: 98.8 F (37.1 C)  Resp: 19     Body mass index is 34.73 kg/(m^2).    ECOG FS:0 - Asymptomatic   Skin: warm, dry  HEENT: sclerae anicteric, conjunctivae pink, oropharynx clear. No thrush or mucositis.  Lymph Nodes: No cervical or supraclavicular lymphadenopathy  Lungs: clear to auscultation bilaterally, no rales, wheezes, or rhonci  Heart: regular rate and rhythm  Abdomen: round, soft, non tender, positive bowel sounds  Musculoskeletal: No focal spinal tenderness, no peripheral edema  Neuro: non focal, well oriented, positive affect  Breast: deferred   LAB RESULTS:  CMP     Component Value Date/Time   NA 143 11/19/2014 1125   NA 137 01/14/2014 1206   K 3.2* 11/19/2014 1125   K 3.6 01/14/2014 1206   CL 102 01/14/2014 1206   CO2 25 11/19/2014 1125   CO2 27 01/14/2014 1206   GLUCOSE 121 11/19/2014 1125   GLUCOSE 90 01/14/2014 1206   BUN 8.3 11/19/2014 1125   BUN 16 01/14/2014 1206   CREATININE 0.9 11/19/2014 1125   CREATININE 0.9 01/14/2014 1206   CALCIUM 9.6 11/19/2014 1125   CALCIUM 9.5 01/14/2014 1206   PROT 7.3 11/19/2014 1125   PROT 7.5 01/14/2014 1206   ALBUMIN 4.0 11/19/2014 1125   ALBUMIN 4.2 01/14/2014 1206   AST 15 11/19/2014 1125   AST 22 01/14/2014 1206   ALT 12 11/19/2014 1125   ALT 20 01/14/2014 1206   ALKPHOS 87 11/19/2014 1125   ALKPHOS 77 01/14/2014 1206   BILITOT 0.76 11/19/2014 1125   BILITOT 0.6 01/14/2014 1206   GFRNONAA 98.72 09/30/2009 1023   GFRAA 87 12/30/2007 0842    INo results found for: SPEP, UPEP  Lab Results  Component Value Date   WBC 1.6* 11/26/2014   NEUTROABS 0.3* 11/26/2014   HGB 12.6 11/26/2014   HCT 36.7 11/26/2014   MCV 83.4 11/26/2014    PLT 121* 11/26/2014      Chemistry      Component Value Date/Time   NA 143 11/19/2014 1125   NA 137 01/14/2014 1206   K 3.2* 11/19/2014 1125   K 3.6 01/14/2014 1206   CL 102 01/14/2014 1206   CO2 25 11/19/2014 1125   CO2 27  01/14/2014 1206   BUN 8.3 11/19/2014 1125   BUN 16 01/14/2014 1206   CREATININE 0.9 11/19/2014 1125   CREATININE 0.9 01/14/2014 1206      Component Value Date/Time   CALCIUM 9.6 11/19/2014 1125   CALCIUM 9.5 01/14/2014 1206   ALKPHOS 87 11/19/2014 1125   ALKPHOS 77 01/14/2014 1206   AST 15 11/19/2014 1125   AST 22 01/14/2014 1206   ALT 12 11/19/2014 1125   ALT 20 01/14/2014 1206   BILITOT 0.76 11/19/2014 1125   BILITOT 0.6 01/14/2014 1206       No results found for: LABCA2  No components found for: LABCA125  No results for input(s): INR in the last 168 hours.  Urinalysis    Component Value Date/Time   COLORURINE YELLOW 01/14/2014 1206   APPEARANCEUR CLEAR 01/14/2014 1206   LABSPEC <=1.005* 01/14/2014 1206   PHURINE 5.5 01/14/2014 1206   GLUCOSEU NEGATIVE 01/14/2014 1206   HGBUR NEGATIVE 01/14/2014 1206   HGBUR negative 08/19/2010 1041   BILIRUBINUR NEGATIVE 01/14/2014 1206   KETONESUR NEGATIVE 01/14/2014 1206   UROBILINOGEN 0.2 01/14/2014 1206   NITRITE NEGATIVE 01/14/2014 1206   LEUKOCYTESUR NEGATIVE 01/14/2014 1206    STUDIES: Nm Pet Image Initial (pi) Skull Base To Thigh  11/05/2014   CLINICAL DATA:  Initial treatment strategy for right breast cancer.  EXAM: NUCLEAR MEDICINE PET SKULL BASE TO THIGH  TECHNIQUE: 10.7 mCi F-18 FDG was injected intravenously. Full-ring PET imaging was performed from the skull base to thigh after the radiotracer. CT data was obtained and used for attenuation correction and anatomic localization.  FASTING BLOOD GLUCOSE:  Value: 101 mg/dl  COMPARISON:  Right breast MRI dated 10/26/2014  FINDINGS: NECK  Small right supraclavicular nodes measuring up to 9 mm short axis (series 4/image 40), max SUV 6.1.  CHEST   2.3 x 1.7 cm soft tissue lesion in the outer quadrant of the right breast (series 4/ image 65), max SUV 6.8, corresponding to known primary breast neoplasm.  Associated small right axillary nodes measuring up to 8 mm short axis, max SUV 2.9.  Additional small subpectoral nodes measuring 5-6 mm short axis, although without appreciable hypermetabolism.  No suspicious pulmonary nodules on CT.  ABDOMEN/PELVIS  No abnormal hypermetabolic activity within the liver, pancreas, adrenal glands, or spleen.  Punctate nonobstructing interpolar right renal calculus (series 4/image 106).  No hypermetabolic lymph nodes in the abdomen or pelvis.  SKELETON  No focal hypermetabolic activity to suggest skeletal metastasis.  IMPRESSION: 2.3 cm lesion in the outer quadrant of the right breast, corresponding to known right breast neoplasm.  Hypermetabolic right supraclavicular and right axillary nodal metastases. Additional small right subpectoral nodes, although without appreciable hypermetabolism.  No evidence of distant metastases.   Electronically Signed   By: Julian Hy M.D.   On: 11/05/2014 10:16   [place] Extrem Up Right Ltd  10/30/2014   CLINICAL DATA:  54 year old female with recently diagnosed right breast cancer. An indeterminate right axillary lymph node was recently biopsied and negative for metastatic disease, however on recent MRI the axillary lymph nodes on the right remains concerning and therefore second-look ultrasound and re-biopsy was requested.  EXAM: ULTRASOUND OF THE RIGHT AXILLA  COMPARISON:  PREVIOUS MAMMOGRAMS AND ULTRASOUND AND MRI DATED 10/26/2014  FINDINGS: Physical examination of the right axilla does not reveal any palpable masses. A large bruise is present in the upper-outer right breast at site of prior biopsy.  Targeted ultrasound of the right axilla was performed demonstrating several oval  similar-appearing hypoechoic lymph nodes in the low right axilla with thickened cortices, with a  representative lymph node measuring 1.2 x 0.5 x 1.1 cm.  The lymph nodes do appear similar in the right axilla when compared to remote prior mammograms.  IMPRESSION: Indeterminate right axillary lymph nodes, 1 of which was previously biopsied and negative for metastatic disease.  RECOMMENDATION: Biopsy of a lymph node in the low right axilla will be performed today and subsequently dictated.  I have discussed the findings and recommendations with the patient. Results were also provided in writing at the conclusion of the visit. If applicable, a reminder letter will be sent to the patient regarding the next appointment.  BI-RADS CATEGORY  4: Suspicious abnormality - biopsy should be considered.   Electronically Signed   By: Everlean Alstrom M.D.   On: 10/30/2014 10:01   Dg Chest Portable 1 View  11/06/2014   CLINICAL DATA:  Port-A-Cath placement confirmation.  EXAM: PORTABLE CHEST - 1 VIEW  COMPARISON:  None.  FINDINGS: The heart size and mediastinal contours are within normal limits. Both lungs are clear. The visualized skeletal structures are unremarkable.  Port-A-Cath has been placed from LEFT subclavian approach. Tip lies in the distal SVC. No pneumothorax.  IMPRESSION: No active disease.  Satisfactory Port-A-Cath placement.   Electronically Signed   By: Rolla Flatten M.D.   On: 11/06/2014 14:00   Mm Digital Diagnostic Unilat R  11/05/2014   CLINICAL DATA:  Patient with newly diagnosed right breast IDC and DCIS. She presents today for stereotactic guided core needle biopsy of posterior extent of microcalcifications.  EXAM: DIAGNOSTIC RIGHT MAMMOGRAM POST STEREOTACTIC BIOPSY  COMPARISON:  Previous exam(s).  FINDINGS: Mammographic images were obtained following stereotactic guided biopsy of right breast microcalcifications. These demonstrate satisfactory positioning of the X shaped metal tissue marker in the posterior upper outer right breast and a minimal post biopsy change. The metal tissue marker is located  approximately 5 cm posterior and slightly superior to the previous biopsy site.  IMPRESSION: Status post stereotactic guided core needle biopsy. Pathology is pending.  Final Assessment: Post Procedure Mammograms for Marker Placement   Electronically Signed   By: Andres Shad   On: 11/05/2014 16:30   Dg Fluoro Guide Cv Line-no Report  11/06/2014   CLINICAL DATA:    FLOURO GUIDE CV LINE  Fluoroscopy was utilized by the requesting physician.  No radiographic  interpretation.    Mm Rt Breast Bx W Loc Dev 1st Lesion Image Bx Spec Stereo Guide  11/10/2014   ADDENDUM REPORT: 11/10/2014 07:21  ADDENDUM: Pathology revealed microinvasive ductal carcinoma with associated high grade ductal carcinoma in situ with necrosis and calcifications in the upper outer right breast. This was found to be concordant by Dr. Robina Ade. Pathology was discussed with the patient by telephone. She reported doing well after the biopsy with tenderness at the site. Post biopsy instructions and care were reviewed and her questions were answered. 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 She is to follow her outlined treatment plan. She has my number for future questions and concerns.  Pathology results reported by Susa Raring RN, BSN on November 10, 2014.   Electronically Signed   By: Andres Shad   On: 11/10/2014 07:21   11/10/2014   CLINICAL DATA:  Patient is a 54 year old female with new diagnosis of right breast IDC and DCIS. Contrast-enhanced breast MR demonstrated abnormal non mass enhancement thought to likely correspond to  the suspicious microcalcifications noted on diagnostic mammography. Patient presents for stereotactic guided core needle biopsy to document extent of disease.  EXAM: RIGHT BREAST STEREOTACTIC CORE NEEDLE BIOPSY  COMPARISON:  Previous exams.  FINDINGS: The patient and I discussed the procedure of stereotactic-guided biopsy including benefits and alternatives. We  discussed the high likelihood of a successful procedure. We discussed the risks of the procedure including infection, bleeding, tissue injury, clip migration, and inadequate sampling. Informed written consent was given. The usual time out protocol was performed immediately prior to the procedure.  Preprocedure spot-compression magnification views of the upper-outer right breast were performed demonstrating extensive coarse heterogeneous microcalcifications. Suspicious microcalcifications with linear orientation towards the right nipple are noted most posteriorly to the biopsy site of disease and will be targeted for stereotactic guided core needle biopsy.  Using sterile technique and 2% Lidocaine with and without epinephrine as local anesthetic, under stereotactic guidance, a 9 gauge vacuum assisted device was used to perform core needle biopsy of microcalcifications in the outer right breast using a cranial to caudal approach. Specimen radiograph was performed showing microcalcifications. Specimens with calcifications are identified for pathology.  At the conclusion of the procedure, a X shaped tissue marker clip was deployed into the biopsy cavity. Follow-up 2-view mammogram was performed and is dictated separately.  IMPRESSION: Stereotactic-guided biopsy of the right breast. No apparent complications. Pathology is pending.  Electronically Signed: By: Andres Shad On: 11/05/2014 16:22   [place] Rt Breast Bx W Loc Dev 1st Lesion Img Bx Spec US Guide  11/03/2014   ADDENDUM REPORT: 11/03/2014 10:17  ADDENDUM: Pathology revealed a benign lymph node with reactive hyperplasia and sinus histiocytosis in the right axilla. This was found to be concordant by Dr. Enrique Sack. Pathology was discussed with the patient by telephone. She reported doing well after the biopsy with tenderness at the site. Post biopsy instructions and care were reviewed and her questions were answered. She has recently been diagnosed with right breast  invasive ductal carcinoma. She is scheduled for a right breast stereotactic biopsy on November 05, 2014.  Pathology results reported by Susa Raring RN, BSN on November 03, 2014.   Electronically Signed   By: Everlean Alstrom M.D.   On: 11/03/2014 10:17   11/03/2014   CLINICAL DATA:  Recently diagnosed right breast cancer with indeterminate right axillary lymph nodes, 1 of which was biopsied and negative for metastatic disease. Re-biopsy is requested.  EXAM: ULTRASOUND GUIDED RIGHT BREAST CORE NEEDLE BIOPSY  COMPARISON:  Previous exam(s).  PROCEDURE: I met with the patient and we discussed the procedure of ultrasound-guided biopsy, including benefits and alternatives. We discussed the high likelihood of a successful procedure. We discussed the risks of the procedure including infection, bleeding, tissue injury, clip migration, and inadequate sampling. Informed written consent was given. The usual time-out protocol was performed immediately prior to the procedure.  Using sterile technique and 2% Lidocaine as local anesthetic, under direct ultrasound visualization, a 12 gauge vacuum-assisted device was used to perform biopsy of an indeterminate lymph node in the lower right axillausing a lateral approach.  IMPRESSION: Ultrasound-guided biopsy of an indeterminate lymph node in the lower right axilla. No apparent complications.  Electronically Signed: By: Everlean Alstrom M.D. On: 10/30/2014 10:02    ASSESSMENT: 54 y.o. 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 The labs were reviewed in detail and her Johnson is down to 0.3. I explained neutropenic precautions to her, including good hand hygiene, avoiding crowds and sick contacts, and alerting [place] of any temperatures above 100.5. I have sent a prescription for cipro 552m BID x 5 days as a prophylactic antibiotic regimen.   KJuliann Pulsewill return next week for the start of cycle 2 of treatment. She understands and agrees with this plan. She knows the goal of treatment in her case is cure. She has been encouraged to call with any issues that might arise before her next visit here.  HLaurie Panda NP   11/26/2014 1:12 PM

## 2014-11-30 ENCOUNTER — Other Ambulatory Visit: Payer: Self-pay | Admitting: Internal Medicine

## 2014-11-30 ENCOUNTER — Encounter: Payer: Self-pay | Admitting: Oncology

## 2014-11-30 NOTE — Progress Notes (Signed)
I faxed disability forms to [employer] [phone]. Patient wants copy left at front desk.

## 2014-12-03 ENCOUNTER — Encounter: Payer: Self-pay | Admitting: Nurse Practitioner

## 2014-12-03 ENCOUNTER — Other Ambulatory Visit (HOSPITAL_BASED_OUTPATIENT_CLINIC_OR_DEPARTMENT_OTHER): Payer: BC Managed Care – PPO

## 2014-12-03 ENCOUNTER — Telehealth: Payer: Self-pay | Admitting: Nurse Practitioner

## 2014-12-03 ENCOUNTER — Ambulatory Visit (HOSPITAL_BASED_OUTPATIENT_CLINIC_OR_DEPARTMENT_OTHER): Payer: BC Managed Care – PPO

## 2014-12-03 ENCOUNTER — Other Ambulatory Visit: Payer: Self-pay | Admitting: Oncology

## 2014-12-03 ENCOUNTER — Ambulatory Visit (HOSPITAL_BASED_OUTPATIENT_CLINIC_OR_DEPARTMENT_OTHER): Payer: BC Managed Care – PPO | Admitting: Nurse Practitioner

## 2014-12-03 ENCOUNTER — Other Ambulatory Visit: Payer: Self-pay

## 2014-12-03 VITALS — BP 137/73 | HR 98 | Temp 98.2°F | Resp 20 | Ht 66.0 in | Wt 218.6 lb

## 2014-12-03 DIAGNOSIS — C50411 Malignant neoplasm of upper-outer quadrant of right female breast: Secondary | ICD-10-CM

## 2014-12-03 DIAGNOSIS — C50811 Malignant neoplasm of overlapping sites of right female breast: Secondary | ICD-10-CM

## 2014-12-03 DIAGNOSIS — G35 Multiple sclerosis: Secondary | ICD-10-CM

## 2014-12-03 DIAGNOSIS — I10 Essential (primary) hypertension: Secondary | ICD-10-CM

## 2014-12-03 DIAGNOSIS — E876 Hypokalemia: Secondary | ICD-10-CM

## 2014-12-03 DIAGNOSIS — Z5111 Encounter for antineoplastic chemotherapy: Secondary | ICD-10-CM

## 2014-12-03 DIAGNOSIS — Z171 Estrogen receptor negative status [ER-]: Secondary | ICD-10-CM

## 2014-12-03 LAB — COMPREHENSIVE METABOLIC PANEL (CC13)
ALBUMIN: 3.6 g/dL (ref 3.5–5.0)
ALT: 15 U/L (ref 0–55)
ANION GAP: 10 meq/L (ref 3–11)
AST: 12 U/L (ref 5–34)
Alkaline Phosphatase: 91 U/L (ref 40–150)
BILIRUBIN TOTAL: 0.29 mg/dL (ref 0.20–1.20)
BUN: 10.8 mg/dL (ref 7.0–26.0)
CO2: 26 mEq/L (ref 22–29)
CREATININE: 0.8 mg/dL (ref 0.6–1.1)
Calcium: 9.1 mg/dL (ref 8.4–10.4)
Chloride: 105 mEq/L (ref 98–109)
EGFR: >90 mL/min/{1.73_m2} (ref >90)
Glucose: 145 mg/dl — ABNORMAL HIGH (ref 70–140)
Potassium: 3.2 mEq/L — ABNORMAL LOW (ref 3.5–5.1)
Sodium: 142 mEq/L (ref 136–145)
Total Protein: 6.5 g/dL (ref 6.4–8.3)

## 2014-12-03 LAB — CBC WITH DIFFERENTIAL/PLATELET
BASO%: 0.7 % (ref 0.0–2.0)
Basophils Absolute: 0 10*3/uL (ref 0.0–0.1)
EOS ABS: 0 10*3/uL (ref 0.0–0.5)
EOS%: 0.3 % (ref 0.0–7.0)
HCT: 35.5 % (ref 34.8–46.6)
HGB: 11.6 g/dL (ref 11.6–15.9)
LYMPH#: 1.2 10*3/uL (ref 0.9–3.3)
LYMPH%: 20.6 % (ref 14.0–49.7)
MCH: 28.2 pg (ref 25.1–34.0)
MCHC: 32.8 g/dL (ref 31.5–36.0)
MCV: 86 fL (ref 79.5–101.0)
MONO#: 0.5 10*3/uL (ref 0.1–0.9)
MONO%: 8.1 % (ref 0.0–14.0)
NEUT#: 4.2 10*3/uL (ref 1.5–6.5)
NEUT%: 70.3 % (ref 38.4–76.8)
PLATELETS: 200 10*3/uL (ref 145–400)
RBC: 4.13 10*6/uL (ref 3.70–5.45)
RDW: 15.2 % — ABNORMAL HIGH (ref 11.2–14.5)
WBC: 6 10*3/uL (ref 3.9–10.3)

## 2014-12-03 MED ORDER — DOXORUBICIN HCL CHEMO IV INJECTION 2 MG/ML
60.0000 mg/m2 | Freq: Once | INTRAVENOUS | Status: AC
  Administered 2014-12-03: 130 mg via INTRAVENOUS
  Filled 2014-12-03: qty 65

## 2014-12-03 MED ORDER — PALONOSETRON HCL INJECTION 0.25 MG/5ML
INTRAVENOUS | Status: AC
  Filled 2014-12-03: qty 5

## 2014-12-03 MED ORDER — SODIUM CHLORIDE 0.9 % IV SOLN: Freq: Once | INTRAVENOUS | Status: DC

## 2014-12-03 MED ORDER — PALONOSETRON HCL INJECTION 0.25 MG/5ML
0.2500 mg | Freq: Once | INTRAVENOUS | Status: AC
  Administered 2014-12-03: 0.25 mg via INTRAVENOUS

## 2014-12-03 MED ORDER — SODIUM CHLORIDE 0.9 % IJ SOLN
10.0000 mL | INTRAMUSCULAR | Status: DC | PRN
  Administered 2014-12-03: 10 mL
  Filled 2014-12-03: qty 10

## 2014-12-03 MED ORDER — SODIUM CHLORIDE 0.9 % IV SOLN
Freq: Once | INTRAVENOUS | Status: AC
  Administered 2014-12-03: 12:00:00 via INTRAVENOUS
  Filled 2014-12-03: qty 5

## 2014-12-03 MED ORDER — SODIUM CHLORIDE 0.9 % IV SOLN
600.0000 mg/m2 | Freq: Once | INTRAVENOUS | Status: AC
  Administered 2014-12-03: 1300 mg via INTRAVENOUS
  Filled 2014-12-03: qty 65

## 2014-12-03 MED ORDER — HEPARIN SOD (PORK) LOCK FLUSH 100 UNIT/ML IV SOLN
500.0000 [IU] | Freq: Once | INTRAVENOUS | Status: AC | PRN
  Administered 2014-12-03: 500 [IU]
  Filled 2014-12-03: qty 5

## 2014-12-03 NOTE — Patient Instructions (Addendum)
Per Nira Conn NP, begin taking Potassium 20 meq daily for 7 days.  We will re check your labs on 12/10/14.  [facility] Discharge Instructions for Patients Receiving Chemotherapy  Today you received the following chemotherapy agents Adriamycin, Cytoxan  To help prevent nausea and vomiting after your treatment, we encourage you to take your nausea medication as directed/prescribed   If you develop nausea and vomiting that is not controlled by your nausea medication, call the clinic.   BELOW ARE SYMPTOMS THAT SHOULD BE REPORTED IMMEDIATELY:  *FEVER GREATER THAN 100.5 F  *CHILLS WITH OR WITHOUT FEVER  NAUSEA AND VOMITING THAT IS NOT CONTROLLED WITH YOUR NAUSEA MEDICATION  *UNUSUAL SHORTNESS OF BREATH  *UNUSUAL BRUISING OR BLEEDING  TENDERNESS IN MOUTH AND THROAT WITH OR WITHOUT PRESENCE OF ULCERS  *URINARY PROBLEMS  *BOWEL PROBLEMS  UNUSUAL RASH Items with * indicate a potential emergency and should be followed up as soon as possible.  Feel free to call the clinic you have any questions or concerns. The clinic phone number is [phone].  Please show the [facility] at check-in to the Emergency Department and triage nurse.

## 2014-12-03 NOTE — Progress Notes (Signed)
[place]  [phone] [phone]) [phone]     ID: Margaret Cockerill DOB: 12/04/1960  MR#: [id number]  ZCH#:[id number]  Patient Care Team: [name], MD as PCP - General [name], MD as Consulting Physician (Obstetrics and Gynecology) [name], MD (Gastroenterology) [name]. [name], MD (Neurology) [name], MD as Consulting Physician (General Surgery) [name], MD as Consulting Physician (Oncology) [name], MD as Attending Physician (Radiation Oncology) [name], RN as Registered Nurse [name], RN as Registered Nurse OTHER MD:  CHIEF COMPLAINT: Triple negative breast cancer  CURRENT TREATMENT: Neoadjuvant chemotherapy   BREAST CANCER HISTORY: From the original intake note:  [name] [name] had bilateral screening mammography at the [facility] 11/21/2013. Breast density was category C. Exam was unremarkable. In January 2016 however the patient palpated a change in her right breast laterally. She contacted her gynecologist, Dr. [name], and he set her up for a unilateral right diagnostic mammography with right breast ultrasonography at [facility] 10/15/2014. There was a focal irregular opacity in the lateral right breast associated with pleomorphic calcifications. This was palpable on exam as a firm non-mobile mass. Ultrasound confirmed a hypoechoic mass in the right breast at the 9:00 position 5 cm from the nipple, measuring 2.9 cm. Next to this mass was a normal-sized intramammary lymph node measuring 4 mm. In the right axilla there were 3 contiguous level I lymph nodes the largest measuring 11 mm.  On 10/20/2014 the patient underwent right core needle biopsy of the 9:00 breast mass, showing ([id number]) and invasive ductal carcinoma, grade 3, triple negative, with the HER-2/neu signals ratio of 1.26 and number per cell 2.15. The MIB-1 was 53%. Biopsy of the largest right axillary lymph node was negative.  Dr. Owens Shark the mammographer who performed a biopsy describes this is concordant.  Left breast mammography 10/26/2014 was negative, and bilateral breast MRI the same day confirmed, in the right breast, and upper outer quadrant mass measuring 2.5 cm, with a second mass measuring 1.4 cm slightly anterior and superior to it. In addition there was a total area of approximately 6.5 cm of non-masslike enhancement extending throughout the upper outer quadrant of the right breast. There was a cortically thickened right axillary lymph node which had been biopsied. There was also a mildly cortically thickened right subpectoral lymph node. There were no internal mammary or left axillary lymph nodes in the left breast was unremarkable.  The patient's subsequent history is as detailed below.  INTERVAL HISTORY: [name] [name] returns today for follow up of her breast cancer. Today is day 1, cycle 2 of cyclophosphamide and doxorubicin, with neulasta given on day 2 for granulocyte support.   REVIEW OF SYSTEMS: [name] [name] has had no changes since her last visit. She denies fevers, chills, nausea, vomiting, or changes in bowel or bladder habit. She is eating and drinking well. She has no fatigue during the day, though she has trouble falling asleep at night. The baseline numbness to her fingertips is no worse. A detailed review of systems is otherwise entirely negative.   PAST MEDICAL HISTORY: Past Medical History  Diagnosis Date  . METABOLIC SYNDROME X   . OBESITY, TRUNCAL   . CONSTIPATION, CHRONIC   . KELOID   . Multiple sclerosis   . Allergic rhinitis due to other allergen   . HYPERTENSION   . HYPERLIPIDEMIA   . GERD   . Angioedema     ACEI  . Colon polyps 05/2014  adenomatous, colo q 5y  . Migraine headache   . Allergy   . Anxiety   . Breast cancer of upper-outer quadrant of right female breast 10/23/2014  . Hot flashes     PAST SURGICAL HISTORY: Past Surgical History  Procedure Laterality Date  . Tubal  ligation    . Colonoscopy    . Portacath placement Left 11/06/2014    Procedure: INSERTION PORT-A-CATH;  Surgeon: [name], MD;  Location: [facility];  Service: General;  Laterality: Left;    FAMILY HISTORY Family History  Problem Relation Age of Onset  . Coronary artery disease Mother   . Heart disease Mother   . Diabetes Mother   . Hypertension Other   . Hyperlipidemia Other   . Diabetes Other   . Colon cancer Neg Hx   . Alcohol abuse Father   . COPD Maternal Grandmother    the patient's father died at the age of 14 following his seizure. The patient's mother died from congestive 62 failure at the age of 32. The patient had 5 brothers and 2 sisters. One brother died from Escherichia coli infection, the other one had a history of seizure disorder. One sister died from causes unknown to the patient. There is no history of breast or ovarian cancer in the family.  GYNECOLOGIC HISTORY:  Patient's last menstrual period was 11/01/2012. Menarche age 18, first live birth age 54. The patient is GX P2. She stopped having periods in May 2014. She did not take hormone replacement. She did take her control pills remotely for approximately 4 years with no complications.  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 She describes herself is single, and lives by herself. Her daughter [name] lives in [place] and works in Ambulance person for [employer]. Son Munising "[name]" Fini is an Engineer, building services for [employer]: Not in place   HEALTH MAINTENANCE: History  Substance Use Topics  . Smoking status: Never Smoker   . Smokeless tobacco: Never Used     Comment: Single- brother staying with pt since mom's death  . Alcohol Use: No     Colonoscopy: September 2015/[name]  PAP: February 2015  Bone density:  Lipid panel:  Allergies  Allergen Reactions  . Ace Inhibitors  Anaphylaxis    REACTION: Angioedema (tongue swelling)  . Clarithromycin Anaphylaxis    Throat swells  . Levaquin [Levofloxacin] Other (See Comments)    Tendon pain - shoulder and calf    Current Outpatient Prescriptions  Medication Sig Dispense Refill  . dexamethasone (DECADRON) 4 MG tablet Take 2 tablets by mouth once a day on the day after chemotherapy and then take 2 tablets two times a day for 2 days. Take with food. 30 tablet 1  . Glatiramer Acetate (COPAXONE) 40 MG/ML SOSY Inject 40 mg into the skin 3 (three) times a week. 12 Syringe 0  . hydrochlorothiazide (HYDRODIURIL) 25 MG tablet Take 1 tablet (25 mg total) by mouth daily. 90 tablet 1  . lidocaine-prilocaine (EMLA) cream Apply to affected area once 30 g 3  . loratadine-pseudoephedrine (CLARITIN-D 12 HOUR) 5-120 MG per tablet Take 1 tablet by mouth as needed for allergies. 30 tablet 3  . losartan (COZAAR) 50 MG tablet Take 1 tablet (50 mg total) by mouth 2 (two) times daily. 180 tablet 3  . metoprolol (LOPRESSOR) 50 MG tablet Take 1 tablet (50 mg total) by mouth 2 (two) times  daily. 180 tablet 1  . ondansetron (ZOFRAN) 8 MG tablet Take 1 tablet (8 mg total) by mouth 2 (two) times daily as needed. Start on the third day after chemotherapy. 30 tablet 1  . pantoprazole (PROTONIX) 40 MG tablet Take 1 tablet (40 mg total) by mouth daily. 90 tablet 1  . prochlorperazine (COMPAZINE) 10 MG tablet Take 1 tablet (10 mg total) by mouth every 6 (six) hours as needed (Nausea or vomiting). 30 tablet 1  . ALPRAZolam (XANAX) 0.5 MG tablet Take 1 tablet (0.5 mg total) by mouth 3 (three) times daily as needed for anxiety or sleep. (Patient not taking: Reported on 12/03/2014) 30 tablet 1  . ciprofloxacin (CIPRO) 500 MG tablet Take 1 tablet (500 mg total) by mouth 2 (two) times daily. (Patient not taking: Reported on 12/03/2014) 10 tablet 0  . potassium chloride (MICRO-K) 10 MEQ CR capsule Take 2 capsules (20 mEq total) by mouth 2 (two) times daily.  (Patient not taking: Reported on 12/03/2014) 60 capsule 1  . [DISCONTINUED] mometasone (NASONEX) 50 MCG/ACT nasal spray Place 2 sprays into the nose daily. 17 g 2   No current facility-administered medications for this visit.    OBJECTIVE: Middle-aged African-American woman who appears well Filed Vitals:   12/03/14 0957  BP: 137/73  [name]: 98  Temp: 98.2 F (36.8 C)  Resp: 20     Body mass index is 35.3 kg/(m^2).    ECOG FS:0 - Asymptomatic   Sclerae unicteric, pupils equal and reactive Oropharynx clear and moist-- no thrush No cervical or supraclavicular adenopathy Lungs no rales or rhonchi Heart regular rate and rhythm Abd soft, nontender, positive bowel sounds MSK no focal spinal tenderness, no upper extremity lymphedema Neuro: nonfocal, well oriented, appropriate affect Breasts: deferred  LAB RESULTS:  CMP     Component Value Date/Time   NA 143 11/19/2014 1125   NA 137 01/14/2014 1206   K 3.2* 11/19/2014 1125   K 3.6 01/14/2014 1206   CL 102 01/14/2014 1206   CO2 25 11/19/2014 1125   CO2 27 01/14/2014 1206   GLUCOSE 121 11/19/2014 1125   GLUCOSE 90 01/14/2014 1206   BUN 8.3 11/19/2014 1125   BUN 16 01/14/2014 1206   CREATININE 0.9 11/19/2014 1125   CREATININE 0.9 01/14/2014 1206   CALCIUM 9.6 11/19/2014 1125   CALCIUM 9.5 01/14/2014 1206   PROT 7.3 11/19/2014 1125   PROT 7.5 01/14/2014 1206   ALBUMIN 4.0 11/19/2014 1125   ALBUMIN 4.2 01/14/2014 1206   AST 15 11/19/2014 1125   AST 22 01/14/2014 1206   ALT 12 11/19/2014 1125   ALT 20 01/14/2014 1206   ALKPHOS 87 11/19/2014 1125   ALKPHOS 77 01/14/2014 1206   BILITOT 0.76 11/19/2014 1125   BILITOT 0.6 01/14/2014 1206   GFRNONAA 98.72 09/30/2009 1023   GFRAA 87 12/30/2007 0842    INo results found for: SPEP, UPEP  Lab Results  Component Value Date   WBC 1.6* 11/26/2014   NEUTROABS 0.3* 11/26/2014   HGB 12.6 11/26/2014   HCT 36.7 11/26/2014   MCV 83.4 11/26/2014   PLT 121* 11/26/2014       Chemistry      Component Value Date/Time   NA 143 11/19/2014 1125   NA 137 01/14/2014 1206   K 3.2* 11/19/2014 1125   K 3.6 01/14/2014 1206   CL 102 01/14/2014 1206   CO2 25 11/19/2014 1125   CO2 27 01/14/2014 1206   BUN 8.3 11/19/2014 1125   BUN  16 01/14/2014 1206   CREATININE 0.9 11/19/2014 1125   CREATININE 0.9 01/14/2014 1206      Component Value Date/Time   CALCIUM 9.6 11/19/2014 1125   CALCIUM 9.5 01/14/2014 1206   ALKPHOS 87 11/19/2014 1125   ALKPHOS 77 01/14/2014 1206   AST 15 11/19/2014 1125   AST 22 01/14/2014 1206   ALT 12 11/19/2014 1125   ALT 20 01/14/2014 1206   BILITOT 0.76 11/19/2014 1125   BILITOT 0.6 01/14/2014 1206       No results found for: LABCA2  No components found for: LABCA125  No results for input(s): INR in the last 168 hours.  Urinalysis    Component Value Date/Time   COLORURINE YELLOW 01/14/2014 1206   APPEARANCEUR CLEAR 01/14/2014 1206   LABSPEC <=1.005* 01/14/2014 1206   PHURINE 5.5 01/14/2014 1206   GLUCOSEU NEGATIVE 01/14/2014 1206   HGBUR NEGATIVE 01/14/2014 1206   HGBUR negative 08/19/2010 1041   BILIRUBINUR NEGATIVE 01/14/2014 1206   KETONESUR NEGATIVE 01/14/2014 1206   UROBILINOGEN 0.2 01/14/2014 1206   NITRITE NEGATIVE 01/14/2014 1206   LEUKOCYTESUR NEGATIVE 01/14/2014 1206    STUDIES: Nm Pet Image Initial (pi) Skull Base To Thigh  11/05/2014   CLINICAL DATA:  Initial treatment strategy for right breast cancer.  EXAM: NUCLEAR MEDICINE PET SKULL BASE TO THIGH  TECHNIQUE: 10.7 mCi F-18 FDG was injected intravenously. Full-ring PET imaging was performed from the skull base to thigh after the radiotracer. CT data was obtained and used for attenuation correction and anatomic localization.  FASTING BLOOD GLUCOSE:  Value: 101 mg/dl  COMPARISON:  Right breast MRI dated 10/26/2014  FINDINGS: NECK  Small right supraclavicular nodes measuring up to 9 mm short axis (series 4/image 40), max SUV 6.1.  CHEST  2.3 x 1.7 cm soft tissue  lesion in the outer quadrant of the right breast (series 4/ image 65), max SUV 6.8, corresponding to known primary breast neoplasm.  Associated small right axillary nodes measuring up to 8 mm short axis, max SUV 2.9.  Additional small subpectoral nodes measuring 5-6 mm short axis, although without appreciable hypermetabolism.  No suspicious pulmonary nodules on CT.  ABDOMEN/PELVIS  No abnormal hypermetabolic activity within the liver, pancreas, adrenal glands, or spleen.  Punctate nonobstructing interpolar right renal calculus (series 4/image 106).  No hypermetabolic lymph nodes in the abdomen or pelvis.  SKELETON  No focal hypermetabolic activity to suggest skeletal metastasis.  IMPRESSION: 2.3 cm lesion in the outer quadrant of the right breast, corresponding to known right breast neoplasm.  Hypermetabolic right supraclavicular and right axillary nodal metastases. Additional small right subpectoral nodes, although without appreciable hypermetabolism.  No evidence of distant metastases.   Electronically Signed   By: Julian Hy M.D.   On: 11/05/2014 10:16   Dg Chest Portable 1 View  11/06/2014   CLINICAL DATA:  Port-A-Cath placement confirmation.  EXAM: PORTABLE CHEST - 1 VIEW  COMPARISON:  None.  FINDINGS: The heart size and mediastinal contours are within normal limits. Both lungs are clear. The visualized skeletal structures are unremarkable.  Port-A-Cath has been placed from LEFT subclavian approach. Tip lies in the distal SVC. No pneumothorax.  IMPRESSION: No active disease.  Satisfactory Port-A-Cath placement.   Electronically Signed   By: Rolla Flatten M.D.   On: 11/06/2014 14:00   Mm Digital Diagnostic Unilat R  11/05/2014   CLINICAL DATA:  Patient with newly diagnosed right breast IDC and DCIS. She presents today for stereotactic guided core needle biopsy of posterior extent  of microcalcifications.  EXAM: DIAGNOSTIC RIGHT MAMMOGRAM POST STEREOTACTIC BIOPSY  COMPARISON:  Previous exam(s).   FINDINGS: Mammographic images were obtained following stereotactic guided biopsy of right breast microcalcifications. These demonstrate satisfactory positioning of the X shaped metal tissue marker in the posterior upper outer right breast and a minimal post biopsy change. The metal tissue marker is located approximately 5 cm posterior and slightly superior to the previous biopsy site.  IMPRESSION: Status post stereotactic guided core needle biopsy. Pathology is pending.  Final Assessment: Post Procedure Mammograms for Marker Placement   Electronically Signed   By: Andres Shad   On: 11/05/2014 16:30   Dg Fluoro Guide Cv Line-no Report  11/06/2014   CLINICAL DATA:    FLOURO GUIDE CV LINE  Fluoroscopy was utilized by the requesting physician.  No radiographic  interpretation.    Mm Rt Breast Bx W Loc Dev 1st Lesion Image Bx Spec Stereo Guide  11/10/2014   ADDENDUM REPORT: 11/10/2014 07:21  ADDENDUM: Pathology revealed microinvasive ductal carcinoma with associated high grade ductal carcinoma in situ with necrosis and calcifications in the upper outer right breast. This was found to be concordant by Dr. Robina Ade. Pathology was discussed with the patient by telephone. She reported doing well after the biopsy with tenderness at the site. Post biopsy instructions and care were reviewed and her questions were answered. 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 She is to follow her outlined treatment plan. She has my number for future questions and concerns.  Pathology results reported by Susa Raring RN, BSN on November 10, 2014.   Electronically Signed   By: Andres Shad   On: 11/10/2014 07:21   11/10/2014   CLINICAL DATA:  Patient is a 54 year old female with new diagnosis of right breast IDC and DCIS. Contrast-enhanced breast MR demonstrated abnormal non mass enhancement thought to likely correspond to the suspicious microcalcifications noted on diagnostic  mammography. Patient presents for stereotactic guided core needle biopsy to document extent of disease.  EXAM: RIGHT BREAST STEREOTACTIC CORE NEEDLE BIOPSY  COMPARISON:  Previous exams.  FINDINGS: The patient and I discussed the procedure of stereotactic-guided biopsy including benefits and alternatives. We discussed the high likelihood of a successful procedure. We discussed the risks of the procedure including infection, bleeding, tissue injury, clip migration, and inadequate sampling. Informed written consent was given. The usual time out protocol was performed immediately prior to the procedure.  Preprocedure spot-compression magnification views of the upper-outer right breast were performed demonstrating extensive coarse heterogeneous microcalcifications. Suspicious microcalcifications with linear orientation towards the right nipple are noted most posteriorly to the biopsy site of disease and will be targeted for stereotactic guided core needle biopsy.  Using sterile technique and 2% Lidocaine with and without epinephrine as local anesthetic, under stereotactic guidance, a 9 gauge vacuum assisted device was used to perform core needle biopsy of microcalcifications in the outer right breast using a cranial to caudal approach. Specimen radiograph was performed showing microcalcifications. Specimens with calcifications are identified for pathology.  At the conclusion of the procedure, a X shaped tissue marker clip was deployed into the biopsy cavity. Follow-up 2-view mammogram was performed and is dictated separately.  IMPRESSION: Stereotactic-guided biopsy of the right breast. No apparent complications. Pathology is pending.  Electronically Signed: By: Andres Shad On: 11/05/2014 16:22    ASSESSMENT: 54 y.o. 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 The labs have not been drawn yet, and thus are not yet available to review. I will send her back to phlebotomy shortly after this visit to have this accomplished. Her her potassium is low, we will restart her supplements. She has been off of them for a week now.    No changes will be made to her antiemetic or bowel regimens at this time. She is doing well on all fronts.   [name] [name] will return next week for labs and a nadir visit. She understands and agrees with this plan. She knows the goal of treatment in her case is cure. She has been encouraged to call with any issues that might arise before her next visit here.  Laurie Panda, NP   12/03/2014 10:34 AM

## 2014-12-03 NOTE — Telephone Encounter (Signed)
per pof ot sch pt appt-pt aware of ap

## 2014-12-04 ENCOUNTER — Ambulatory Visit (HOSPITAL_BASED_OUTPATIENT_CLINIC_OR_DEPARTMENT_OTHER): Payer: BC Managed Care – PPO

## 2014-12-04 DIAGNOSIS — C50811 Malignant neoplasm of overlapping sites of right female breast: Secondary | ICD-10-CM

## 2014-12-04 DIAGNOSIS — C50411 Malignant neoplasm of upper-outer quadrant of right female breast: Secondary | ICD-10-CM

## 2014-12-04 DIAGNOSIS — Z5189 Encounter for other specified aftercare: Secondary | ICD-10-CM

## 2014-12-04 MED ORDER — PEGFILGRASTIM INJECTION 6 MG/0.6ML ~~LOC~~
6.0000 mg | PREFILLED_SYRINGE | Freq: Once | SUBCUTANEOUS | Status: AC
  Administered 2014-12-04: 6 mg via SUBCUTANEOUS
  Filled 2014-12-04: qty 0.6

## 2014-12-10 ENCOUNTER — Ambulatory Visit (HOSPITAL_BASED_OUTPATIENT_CLINIC_OR_DEPARTMENT_OTHER): Payer: BC Managed Care – PPO | Admitting: Nurse Practitioner

## 2014-12-10 ENCOUNTER — Other Ambulatory Visit (HOSPITAL_BASED_OUTPATIENT_CLINIC_OR_DEPARTMENT_OTHER): Payer: BC Managed Care – PPO

## 2014-12-10 ENCOUNTER — Encounter: Payer: Self-pay | Admitting: Nurse Practitioner

## 2014-12-10 VITALS — BP 117/63 | HR 76 | Temp 97.9°F | Resp 20 | Ht 66.0 in | Wt 216.7 lb

## 2014-12-10 DIAGNOSIS — C50811 Malignant neoplasm of overlapping sites of right female breast: Secondary | ICD-10-CM

## 2014-12-10 DIAGNOSIS — E876 Hypokalemia: Secondary | ICD-10-CM | POA: Insufficient documentation

## 2014-12-10 DIAGNOSIS — Z171 Estrogen receptor negative status [ER-]: Secondary | ICD-10-CM | POA: Diagnosis not present

## 2014-12-10 DIAGNOSIS — C50411 Malignant neoplasm of upper-outer quadrant of right female breast: Secondary | ICD-10-CM

## 2014-12-10 DIAGNOSIS — I10 Essential (primary) hypertension: Secondary | ICD-10-CM

## 2014-12-10 DIAGNOSIS — T451X5A Adverse effect of antineoplastic and immunosuppressive drugs, initial encounter: Secondary | ICD-10-CM

## 2014-12-10 DIAGNOSIS — G35 Multiple sclerosis: Secondary | ICD-10-CM

## 2014-12-10 DIAGNOSIS — D701 Agranulocytosis secondary to cancer chemotherapy: Secondary | ICD-10-CM

## 2014-12-10 HISTORY — DX: Hypokalemia: E87.6

## 2014-12-10 LAB — CBC WITH DIFFERENTIAL/PLATELET
BASO%: 0.6 % (ref 0.0–2.0)
BASOS ABS: 0 10*3/uL (ref 0.0–0.1)
EOS ABS: 0 10*3/uL (ref 0.0–0.5)
EOS%: 0.9 % (ref 0.0–7.0)
HEMATOCRIT: 34.6 % — AB (ref 34.8–46.6)
HGB: 11.3 g/dL — ABNORMAL LOW (ref 11.6–15.9)
LYMPH%: 54.8 % — AB (ref 14.0–49.7)
MCH: 27.8 pg (ref 25.1–34.0)
MCHC: 32.7 g/dL (ref 31.5–36.0)
MCV: 85 fL (ref 79.5–101.0)
MONO#: 0.2 10*3/uL (ref 0.1–0.9)
MONO%: 11.3 % (ref 0.0–14.0)
NEUT#: 0.4 10*3/uL — CL (ref 1.5–6.5)
NEUT%: 32.4 % — AB (ref 38.4–76.8)
Platelets: 224 10*3/uL (ref 145–400)
RBC: 4.07 10*6/uL (ref 3.70–5.45)
RDW: 14.9 % — AB (ref 11.2–14.5)
WBC: 1.4 10*3/uL — ABNORMAL LOW (ref 3.9–10.3)
lymph#: 0.8 10*3/uL — ABNORMAL LOW (ref 0.9–3.3)

## 2014-12-10 LAB — COMPREHENSIVE METABOLIC PANEL (CC13)
ALBUMIN: 3.4 g/dL — AB (ref 3.5–5.0)
ALT: 14 U/L (ref 0–55)
AST: 9 U/L (ref 5–34)
Alkaline Phosphatase: 85 U/L (ref 40–150)
Anion Gap: 8 mEq/L (ref 3–11)
BUN: 11.8 mg/dL (ref 7.0–26.0)
CALCIUM: 8.7 mg/dL (ref 8.4–10.4)
CHLORIDE: 101 meq/L (ref 98–109)
CO2: 29 mEq/L (ref 22–29)
Creatinine: 0.8 mg/dL (ref 0.6–1.1)
EGFR: >90 mL/min/{1.73_m2} (ref >90)
Glucose: 93 mg/dl (ref 70–140)
Potassium: 3.7 mEq/L (ref 3.5–5.1)
Sodium: 138 mEq/L (ref 136–145)
TOTAL PROTEIN: 6.2 g/dL — AB (ref 6.4–8.3)
Total Bilirubin: 0.42 mg/dL (ref 0.20–1.20)

## 2014-12-10 MED ORDER — CIPROFLOXACIN HCL 500 MG PO TABS: 500.0000 mg | ORAL_TABLET | Freq: Two times a day (BID) | ORAL | Status: DC

## 2014-12-10 NOTE — Progress Notes (Signed)
[name] [name]  [phone] [phone]     ID: [name] [name] DOB: Sep 24, 1960  MR#: [id number]  HQI#:[id number]  Patient Care Team: [name] Clack, MD as PCP - General [name] Hipp, MD as Consulting Physician (Obstetrics and Gynecology) [name] Shipper, MD (Gastroenterology) [name] Caldron. [name], MD (Neurology) [name] Bookbinder, MD as Consulting Physician (General Surgery) [name] Cruel, MD as Consulting Physician (Oncology) [name] Gibson, MD as Attending Physician (Radiation Oncology) [name] Germany, RN as Registered Nurse [name] Kaufmann, RN as Registered Nurse OTHER MD:  CHIEF COMPLAINT: Triple negative breast cancer  CURRENT TREATMENT: Neoadjuvant chemotherapy   BREAST CANCER HISTORY: From the original intake note:  [name] [name] had bilateral screening mammography at the [facility] Center 11/21/2013. Breast density was category C. Exam was unremarkable. In January 2016 however the patient palpated a change in her right breast laterally. She contacted her gynecologist, Dr. Deatra [name], and he set her up for a unilateral right diagnostic mammography with right breast ultrasonography at [facility] 10/15/2014. There was a focal irregular opacity in the lateral right breast associated with pleomorphic calcifications. This was palpable on exam as a firm non-mobile mass. Ultrasound confirmed a hypoechoic mass in the right breast at the 9:00 position 5 cm from the nipple, measuring 2.9 cm. Next to this mass was a normal-sized intramammary lymph node measuring 4 mm. In the right axilla there were 3 contiguous level I lymph nodes the largest measuring 11 mm.  On 10/20/2014 the patient underwent right core needle biopsy of the 9:00 breast mass, showing ([id number]) and invasive ductal carcinoma, grade 3, triple negative, with the HER-2/neu signals ratio of 1.26 and number per cell 2.15. The MIB-1 was 71%. Biopsy of the largest right axillary lymph node was negative.  Dr. Owens [name] the mammographer who performed a biopsy describes this is concordant.  Left breast mammography 10/26/2014 was negative, and bilateral breast MRI the same day confirmed, in the right breast, and upper outer quadrant mass measuring 2.5 cm, with a second mass measuring 1.4 cm slightly anterior and superior to it. In addition there was a total area of approximately 6.5 cm of non-masslike enhancement extending throughout the upper outer quadrant of the right breast. There was a cortically thickened right axillary lymph node which had been biopsied. There was also a mildly cortically thickened right subpectoral lymph node. There were no internal mammary or left axillary lymph nodes in the left breast was unremarkable.  The patient's subsequent history is as detailed below.  INTERVAL HISTORY: [name] [name] returns today for follow up of her breast cancer. Today is day 8, cycle 2 of cyclophosphamide and doxorubicin, with neulasta given on day 2 for granulocyte support.   REVIEW OF SYSTEMS: [name] [name] denies fevers, chills, nausea, vomiting, or changes in bowel or bladder habits. She continues to eat and drink well despite taste changes. She did have one episode of fatigue this past week. She does not sleep well at night. She has no mouth sores or rashes, and the baseline numbness to her finger tips is no worse. She finally shaved her head last week. A detailed review of systems is otherwise entirely negative.   PAST MEDICAL HISTORY: Past Medical History  Diagnosis Date  . METABOLIC SYNDROME X   . OBESITY, TRUNCAL   . CONSTIPATION, CHRONIC   . KELOID   . Multiple sclerosis   . Allergic rhinitis due to other allergen   . HYPERTENSION   . HYPERLIPIDEMIA   . GERD   . Angioedema  ACEI  . Colon polyps 05/2014    adenomatous, colo q 5y  . Migraine headache   . Allergy   . Anxiety   . Breast cancer of upper-outer quadrant of right female breast 10/23/2014  . Hot flashes     PAST SURGICAL  HISTORY: Past Surgical History  Procedure Laterality Date  . Tubal ligation    . Colonoscopy    . Portacath placement Left 11/06/2014    Procedure: INSERTION PORT-A-CATH;  Surgeon: [name] Bookbinder, MD;  Location: [facility];  Service: General;  Laterality: Left;    FAMILY HISTORY Family History  Problem Relation Age of Onset  . Coronary artery disease Mother   . Heart disease Mother   . Diabetes Mother   . Hypertension Other   . Hyperlipidemia Other   . Diabetes Other   . Colon cancer Neg Hx   . Alcohol abuse Father   . COPD Maternal Grandmother    the patient's father died at the age of 4 following his seizure. The patient's mother died from congestive 79 failure at the age of 49. The patient had 5 brothers and 2 sisters. One brother died from Escherichia coli infection, the other one had a history of seizure disorder. One sister died from causes unknown to the patient. There is no history of breast or ovarian cancer in the family.  GYNECOLOGIC HISTORY:  Patient's last menstrual period was 11/01/2012. Menarche age 67, first live birth age 9. The patient is GX P2. She stopped having periods in May 2014. She did not take hormone replacement. She did take her control pills remotely for approximately 4 years with no complications.  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 Her daughter [name] [name] lives in [name] and works in Ambulance person for [employer]. Son [name] [name] is an Engineer, building services for [employer]: Not in place   HEALTH MAINTENANCE: History  Substance Use Topics  . Smoking status: Never Smoker   . Smokeless tobacco: Never Used     Comment: Single- brother staying with pt since mom's death  . Alcohol Use: No     Colonoscopy: September 2015/[name] [name]  PAP: February 2015  Bone  density:  Lipid panel:  Allergies  Allergen Reactions  . Ace Inhibitors Anaphylaxis    REACTION: Angioedema (tongue swelling)  . Clarithromycin Anaphylaxis    Throat swells  . Levaquin [Levofloxacin] Other (See Comments)    Tendon pain - shoulder and calf    Current Outpatient Prescriptions  Medication Sig Dispense Refill  . dexamethasone (DECADRON) 4 MG tablet Take 2 tablets by mouth once a day on the day after chemotherapy and then take 2 tablets two times a day for 2 days. Take with food. 30 tablet 1  . Glatiramer Acetate (COPAXONE) 40 MG/ML SOSY Inject 40 mg into the skin 3 (three) times a week. 12 Syringe 0  . hydrochlorothiazide (HYDRODIURIL) 25 MG tablet Take 1 tablet (25 mg total) by mouth daily. 90 tablet 1  . lidocaine-prilocaine (EMLA) cream Apply to affected area once 30 g 3  . loratadine-pseudoephedrine (CLARITIN-D 12 HOUR) 5-120 MG per tablet Take 1 tablet by mouth as needed for allergies. 30 tablet 3  . losartan (COZAAR) 50 MG tablet Take 1 tablet (50 mg total) by mouth 2 (two) times daily. 180 tablet 3  . metoprolol (LOPRESSOR) 50 MG tablet Take 1  tablet (50 mg total) by mouth 2 (two) times daily. 180 tablet 1  . ondansetron (ZOFRAN) 8 MG tablet Take 1 tablet (8 mg total) by mouth 2 (two) times daily as needed. Start on the third day after chemotherapy. 30 tablet 1  . pantoprazole (PROTONIX) 40 MG tablet Take 1 tablet (40 mg total) by mouth daily. 90 tablet 1  . potassium chloride (MICRO-K) 10 MEQ CR capsule Take 2 capsules (20 mEq total) by mouth 2 (two) times daily. 60 capsule 1  . prochlorperazine (COMPAZINE) 10 MG tablet Take 1 tablet (10 mg total) by mouth every 6 (six) hours as needed (Nausea or vomiting). 30 tablet 1  . ALPRAZolam (XANAX) 0.5 MG tablet Take 1 tablet (0.5 mg total) by mouth 3 (three) times daily as needed for anxiety or sleep. (Patient not taking: Reported on 12/10/2014) 30 tablet 1  . ciprofloxacin (CIPRO) 500 MG tablet Take 1 tablet (500 mg total) by  mouth 2 (two) times daily. 10 tablet 0  . [DISCONTINUED] mometasone (NASONEX) 50 MCG/ACT nasal spray Place 2 sprays into the nose daily. 17 g 2   No current facility-administered medications for this visit.    OBJECTIVE: Middle-aged African-American woman who appears well Filed Vitals:   12/10/14 1131  BP: 117/63  [name]: 76  Temp: 97.9 F (36.6 C)  Resp: 20     Body mass index is 34.99 kg/(m^2).    ECOG FS:0 - Asymptomatic   Skin: warm, dry  HEENT: sclerae anicteric, conjunctivae pink, oropharynx clear. No thrush or mucositis.  Lymph Nodes: No cervical or supraclavicular lymphadenopathy  Lungs: clear to auscultation bilaterally, no rales, wheezes, or rhonci  Heart: regular rate and rhythm  Abdomen: round, soft, non tender, positive bowel sounds  Musculoskeletal: No focal spinal tenderness, no peripheral edema  Neuro: non focal, well oriented, positive affect  Breasts: deferred   LAB RESULTS:  CMP     Component Value Date/Time   NA 138 12/10/2014 1031   NA 137 01/14/2014 1206   K 3.7 12/10/2014 1031   K 3.6 01/14/2014 1206   CL 102 01/14/2014 1206   CO2 29 12/10/2014 1031   CO2 27 01/14/2014 1206   GLUCOSE 93 12/10/2014 1031   GLUCOSE 90 01/14/2014 1206   BUN 11.8 12/10/2014 1031   BUN 16 01/14/2014 1206   CREATININE 0.8 12/10/2014 1031   CREATININE 0.9 01/14/2014 1206   CALCIUM 8.7 12/10/2014 1031   CALCIUM 9.5 01/14/2014 1206   PROT 6.2* 12/10/2014 1031   PROT 7.5 01/14/2014 1206   ALBUMIN 3.4* 12/10/2014 1031   ALBUMIN 4.2 01/14/2014 1206   AST 9 12/10/2014 1031   AST 22 01/14/2014 1206   ALT 14 12/10/2014 1031   ALT 20 01/14/2014 1206   ALKPHOS 85 12/10/2014 1031   ALKPHOS 77 01/14/2014 1206   BILITOT 0.42 12/10/2014 1031   BILITOT 0.6 01/14/2014 1206   GFRNONAA 98.72 09/30/2009 1023   GFRAA 87 12/30/2007 0842    INo results found for: SPEP, UPEP  Lab Results  Component Value Date   WBC 1.4* 12/10/2014   NEUTROABS 0.4* 12/10/2014   HGB 11.3*  12/10/2014   HCT 34.6* 12/10/2014   MCV 85.0 12/10/2014   PLT 224 12/10/2014      Chemistry      Component Value Date/Time   NA 138 12/10/2014 1031   NA 137 01/14/2014 1206   K 3.7 12/10/2014 1031   K 3.6 01/14/2014 1206   CL 102 01/14/2014 1206   CO2 29  12/10/2014 1031   CO2 27 01/14/2014 1206   BUN 11.8 12/10/2014 1031   BUN 16 01/14/2014 1206   CREATININE 0.8 12/10/2014 1031   CREATININE 0.9 01/14/2014 1206      Component Value Date/Time   CALCIUM 8.7 12/10/2014 1031   CALCIUM 9.5 01/14/2014 1206   ALKPHOS 85 12/10/2014 1031   ALKPHOS 77 01/14/2014 1206   AST 9 12/10/2014 1031   AST 22 01/14/2014 1206   ALT 14 12/10/2014 1031   ALT 20 01/14/2014 1206   BILITOT 0.42 12/10/2014 1031   BILITOT 0.6 01/14/2014 1206       No results found for: LABCA2  No components found for: LABCA125  No results for input(s): INR in the last 168 hours.  Urinalysis    Component Value Date/Time   COLORURINE YELLOW 01/14/2014 1206   APPEARANCEUR CLEAR 01/14/2014 1206   LABSPEC <=1.005* 01/14/2014 1206   PHURINE 5.5 01/14/2014 1206   GLUCOSEU NEGATIVE 01/14/2014 1206   HGBUR NEGATIVE 01/14/2014 1206   HGBUR negative 08/19/2010 1041   BILIRUBINUR NEGATIVE 01/14/2014 1206   KETONESUR NEGATIVE 01/14/2014 1206   UROBILINOGEN 0.2 01/14/2014 1206   NITRITE NEGATIVE 01/14/2014 1206   LEUKOCYTESUR NEGATIVE 01/14/2014 1206    STUDIES: No results found.  ASSESSMENT: 54 y.o. 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 The labs were reviewed in detail and her potassium is back up to 3.8. She will continue her 35meq K+ supplements daily. Her ANC is down to 0.4 today. We reviewed neutropenic precautions, including good hand hygiene, avoiding crowds and sick contacts, and alerting [place] of any temperatures above 100.5. I have sent a prescription for cipro $RemoveB'500mg'[id number]$  BID x 5 days as a prophylactic antibiotic regimen.   [name] [name] will return next week for labs and cycle 3 of cyclophosphamide and doxorubicin. She understands and agrees with this plan. She knows the goal of treatment in her case is cure. She has been encouraged to call with any issues that might arise before her next visit here.  Laurie Panda, NP   12/10/2014 11:50 AM

## 2014-12-11 ENCOUNTER — Encounter: Payer: Self-pay | Admitting: Genetic Counselor

## 2014-12-11 ENCOUNTER — Telehealth: Payer: Self-pay | Admitting: Genetic Counselor

## 2014-12-11 DIAGNOSIS — Z1379 Encounter for other screening for genetic and chromosomal anomalies: Secondary | ICD-10-CM | POA: Insufficient documentation

## 2014-12-11 NOTE — Telephone Encounter (Signed)
LM on VM with good news on test results.  Asked that she call back. 

## 2014-12-14 ENCOUNTER — Telehealth: Payer: Self-pay | Admitting: Genetic Counselor

## 2014-12-14 ENCOUNTER — Encounter: Payer: Self-pay | Admitting: Genetic Counselor

## 2014-12-14 DIAGNOSIS — C50411 Malignant neoplasm of upper-outer quadrant of right female breast: Secondary | ICD-10-CM

## 2014-12-14 DIAGNOSIS — Z1379 Encounter for other screening for genetic and chromosomal anomalies: Secondary | ICD-10-CM

## 2014-12-14 NOTE — Telephone Encounter (Signed)
Patient returned call and I revealed negative genetic test results on the Breast High/moderate risk panel.  The breast high/moderate risk panel performed by [facility] includes sequencing and deletion duplication testing of the following 9 genes:  ATM, BRCA1, BRCA2, CDH1, CHEK2, PALB2, PTEN, STK11, and TP53. The report date is 12/10/2014.

## 2014-12-14 NOTE — Progress Notes (Signed)
HPI: [name] [name] was previously seen in the [facility] due to a personal and family history of cancer and concerns regarding a hereditary predisposition to cancer. Please refer to our prior cancer genetics clinic note for more information regarding [name] [name]'s medical, social and family histories, and our assessment and recommendations, at the time. [name] [name] recent genetic test results were disclosed to her, as were recommendations warranted by these results. These results and recommendations are discussed in more detail below.  GENETIC TEST RESULTS: At the time of [name] [name]'s visit, we recommended she pursue genetic testing of the Breast High/Moderate Risk gene panel. The Breast High/Moderate Risk gene panel offered by [facility] includes sequencing and deletion/duplication analysis of the following 9 genes: ATM, BRCA1, BRCA2, CDH1, CHEK2, PALB2, PTEN, STK11, and TP53.  The report date is December 10, 2014.  Genetic testing was normal, and did not reveal a deleterious mutation in these genes. The test report has been scanned into EPIC and is located under the Media tab.   We discussed with [name] [name] that since the current genetic testing is not perfect, it is possible there may be a gene mutation in one of these genes that current testing cannot detect, but that chance is small. We also discussed, that it is possible that another gene that has not yet been discovered, or that we have not yet tested, is responsible for the cancer diagnoses in the family, and it is, therefore, important to remain in touch with cancer genetics in the future so that we can continue to offer [name] [name] the most up to date genetic testing.   CANCER SCREENING RECOMMENDATIONS: This result is reassuring and suggests that [name] [name]'s personal and family history of cancer was most likely not due to an inherited predisposition associated with one of these genes. Most cancers happen by chance and  this negative test, along with details of her family history, suggests that her cancer falls into this category. We, therefore, recommended she continue to follow the cancer management and screening guidelines provided by her oncology and primary providers.   RECOMMENDATIONS FOR FAMILY MEMBERS: Women in this family might be at some increased risk of developing cancer, over the general population risk, simply due to the family history of cancer. We recommended women in this family have a yearly mammogram beginning at age 31, or 33 years younger than the earliest onset of cancer, an an annual clinical breast exam, and perform monthly breast self-exams. Women in this family should also have a gynecological exam as recommended by their primary provider. All family members should have a colonoscopy by age 3.  FOLLOW-UP: Lastly, we discussed with [name] [name] that cancer genetics is a rapidly advancing field and it is possible that new genetic tests will be appropriate for her and/or her family members in the future. We encouraged her to remain in contact with cancer genetics on an annual basis so we can update her personal and family histories and let her know of advances in cancer genetics that may benefit this family.   Our contact number was provided. [name] [name] questions were answered to her satisfaction, and she knows she is welcome to call us at anytime with additional questions or concerns.   Roma Kayser, MS, [facility] Certified Genetic Counselor [name]

## 2014-12-17 ENCOUNTER — Other Ambulatory Visit (HOSPITAL_BASED_OUTPATIENT_CLINIC_OR_DEPARTMENT_OTHER): Payer: BC Managed Care – PPO

## 2014-12-17 ENCOUNTER — Telehealth: Payer: Self-pay | Admitting: Nurse Practitioner

## 2014-12-17 ENCOUNTER — Other Ambulatory Visit: Payer: Self-pay

## 2014-12-17 ENCOUNTER — Ambulatory Visit (HOSPITAL_BASED_OUTPATIENT_CLINIC_OR_DEPARTMENT_OTHER): Payer: BC Managed Care – PPO | Admitting: Nurse Practitioner

## 2014-12-17 ENCOUNTER — Encounter: Payer: Self-pay | Admitting: Nurse Practitioner

## 2014-12-17 ENCOUNTER — Ambulatory Visit (HOSPITAL_BASED_OUTPATIENT_CLINIC_OR_DEPARTMENT_OTHER): Payer: BC Managed Care – PPO

## 2014-12-17 VITALS — BP 121/79 | HR 96 | Temp 98.9°F | Resp 20 | Wt 216.0 lb

## 2014-12-17 DIAGNOSIS — Z171 Estrogen receptor negative status [ER-]: Secondary | ICD-10-CM

## 2014-12-17 DIAGNOSIS — C50411 Malignant neoplasm of upper-outer quadrant of right female breast: Secondary | ICD-10-CM

## 2014-12-17 DIAGNOSIS — C50811 Malignant neoplasm of overlapping sites of right female breast: Secondary | ICD-10-CM

## 2014-12-17 DIAGNOSIS — Z5111 Encounter for antineoplastic chemotherapy: Secondary | ICD-10-CM | POA: Diagnosis not present

## 2014-12-17 DIAGNOSIS — I10 Essential (primary) hypertension: Secondary | ICD-10-CM

## 2014-12-17 DIAGNOSIS — G35 Multiple sclerosis: Secondary | ICD-10-CM

## 2014-12-17 LAB — CBC WITH DIFFERENTIAL/PLATELET
BASO%: 1 % (ref 0.0–2.0)
BASOS ABS: 0.1 10*3/uL (ref 0.0–0.1)
EOS ABS: 0 10*3/uL (ref 0.0–0.5)
EOS%: 0.4 % (ref 0.0–7.0)
HEMATOCRIT: 36.1 % (ref 34.8–46.6)
HEMOGLOBIN: 11.8 g/dL (ref 11.6–15.9)
LYMPH%: 9 % — AB (ref 14.0–49.7)
MCH: 28 pg (ref 25.1–34.0)
MCHC: 32.6 g/dL (ref 31.5–36.0)
MCV: 85.8 fL (ref 79.5–101.0)
MONO#: 1.2 10*3/uL — AB (ref 0.1–0.9)
MONO%: 13 % (ref 0.0–14.0)
NEUT%: 76.6 % (ref 38.4–76.8)
NEUTROS ABS: 6.9 10*3/uL — AB (ref 1.5–6.5)
Platelets: 197 10*3/uL (ref 145–400)
RBC: 4.21 10*6/uL (ref 3.70–5.45)
RDW: 15.9 % — ABNORMAL HIGH (ref 11.2–14.5)
WBC: 8.9 10*3/uL (ref 3.9–10.3)
lymph#: 0.8 10*3/uL — ABNORMAL LOW (ref 0.9–3.3)

## 2014-12-17 LAB — COMPREHENSIVE METABOLIC PANEL (CC13)
ALT: 14 U/L (ref 0–55)
AST: 12 U/L (ref 5–34)
Albumin: 3.7 g/dL (ref 3.5–5.0)
Alkaline Phosphatase: 111 U/L (ref 40–150)
Anion Gap: 13 mEq/L — ABNORMAL HIGH (ref 3–11)
BUN: 9.8 mg/dL (ref 7.0–26.0)
CO2: 24 meq/L (ref 22–29)
CREATININE: 0.8 mg/dL (ref 0.6–1.1)
Calcium: 9.6 mg/dL (ref 8.4–10.4)
Chloride: 103 mEq/L (ref 98–109)
GLUCOSE: 112 mg/dL (ref 70–140)
POTASSIUM: 3.4 meq/L — AB (ref 3.5–5.1)
Sodium: 139 mEq/L (ref 136–145)
Total Bilirubin: 0.35 mg/dL (ref 0.20–1.20)
Total Protein: 7.1 g/dL (ref 6.4–8.3)

## 2014-12-17 MED ORDER — FOSAPREPITANT DIMEGLUMINE INJECTION 150 MG
Freq: Once | INTRAVENOUS | Status: AC
  Administered 2014-12-17: 13:00:00 via INTRAVENOUS
  Filled 2014-12-17: qty 5

## 2014-12-17 MED ORDER — PALONOSETRON HCL INJECTION 0.25 MG/5ML
0.2500 mg | Freq: Once | INTRAVENOUS | Status: AC
  Administered 2014-12-17: 0.25 mg via INTRAVENOUS

## 2014-12-17 MED ORDER — PALONOSETRON HCL INJECTION 0.25 MG/5ML
INTRAVENOUS | Status: AC
  Filled 2014-12-17: qty 5

## 2014-12-17 MED ORDER — HEPARIN SOD (PORK) LOCK FLUSH 100 UNIT/ML IV SOLN
500.0000 [IU] | Freq: Once | INTRAVENOUS | Status: AC | PRN
  Administered 2014-12-17: 500 [IU]
  Filled 2014-12-17: qty 5

## 2014-12-17 MED ORDER — SODIUM CHLORIDE 0.9 % IV SOLN
Freq: Once | INTRAVENOUS | Status: AC
  Administered 2014-12-17: 13:00:00 via INTRAVENOUS

## 2014-12-17 MED ORDER — SODIUM CHLORIDE 0.9 % IJ SOLN
10.0000 mL | INTRAMUSCULAR | Status: DC | PRN
  Administered 2014-12-17: 10 mL
  Filled 2014-12-17: qty 10

## 2014-12-17 MED ORDER — SODIUM CHLORIDE 0.9 % IV SOLN
600.0000 mg/m2 | Freq: Once | INTRAVENOUS | Status: AC
  Administered 2014-12-17: 1300 mg via INTRAVENOUS
  Filled 2014-12-17: qty 65

## 2014-12-17 MED ORDER — DOXORUBICIN HCL CHEMO IV INJECTION 2 MG/ML
60.0000 mg/m2 | Freq: Once | INTRAVENOUS | Status: AC
  Administered 2014-12-17: 130 mg via INTRAVENOUS
  Filled 2014-12-17: qty 65

## 2014-12-17 NOTE — Patient Instructions (Signed)
Per Nira Conn NP, begin taking Potassium 20 meq daily for 7 days.  We will re check your labs on 12/10/14.  [facility] Discharge Instructions for Patients Receiving Chemotherapy  Today you received the following chemotherapy agents Adriamycin, Cytoxan  To help prevent nausea and vomiting after your treatment, we encourage you to take your nausea medication as directed/prescribed   If you develop nausea and vomiting that is not controlled by your nausea medication, call the clinic.   BELOW ARE SYMPTOMS THAT SHOULD BE REPORTED IMMEDIATELY:  *FEVER GREATER THAN 100.5 F  *CHILLS WITH OR WITHOUT FEVER  NAUSEA AND VOMITING THAT IS NOT CONTROLLED WITH YOUR NAUSEA MEDICATION  *UNUSUAL SHORTNESS OF BREATH  *UNUSUAL BRUISING OR BLEEDING  TENDERNESS IN MOUTH AND THROAT WITH OR WITHOUT PRESENCE OF ULCERS  *URINARY PROBLEMS  *BOWEL PROBLEMS  UNUSUAL RASH Items with * indicate a potential emergency and should be followed up as soon as possible.  Feel free to call the clinic you have any questions or concerns. The clinic phone number is [phone].  Please show the [facility] at check-in to the Emergency Department and triage nurse.

## 2014-12-17 NOTE — Telephone Encounter (Signed)
Appointments made and avs printed for patient °

## 2014-12-17 NOTE — Progress Notes (Signed)
[place]  [phone] Fax:([phone]) [phone]     ID: [name] [name] DOB: 10-Apr-1961  MR#: [id number]  AJG#:[id number]  Patient Care Team: [name], MD as PCP - General [name], MD as Consulting Physician (Obstetrics and Gynecology) [name], MD (Gastroenterology) [name]. [name], MD (Neurology) [name], MD as Consulting Physician (General Surgery) [name], MD as Consulting Physician (Oncology) [name], MD as Attending Physician (Radiation Oncology) [name], RN as Registered Nurse [name], RN as Registered Nurse OTHER MD:  CHIEF COMPLAINT: Triple negative breast cancer  CURRENT TREATMENT: Neoadjuvant chemotherapy   BREAST CANCER HISTORY: From the original intake note:  [name] [name] had bilateral screening mammography at the [facility] 11/21/2013. Breast density was category C. Exam was unremarkable. In January 2016 however the patient palpated a change in her right breast laterally. She contacted her gynecologist, Dr. [name], and he set her up for a unilateral right diagnostic mammography with right breast ultrasonography at [facility] 10/15/2014. There was a focal irregular opacity in the lateral right breast associated with pleomorphic calcifications. This was palpable on exam as a firm non-mobile mass. Ultrasound confirmed a hypoechoic mass in the right breast at the 9:00 position 5 cm from the nipple, measuring 2.9 cm. Next to this mass was a normal-sized intramammary lymph node measuring 4 mm. In the right axilla there were 3 contiguous level I lymph nodes the largest measuring 11 mm.  On 10/20/2014 the patient underwent right core needle biopsy of the 9:00 breast mass, showing ([id number]) and invasive ductal carcinoma, grade 3, triple negative, with the HER-2/neu signals ratio of 1.26 and number per cell 2.15. The MIB-1 was 71%. Biopsy of the largest right axillary lymph node was negative.  Dr. Owens Shark the mammographer who performed a biopsy describes this is concordant.  Left breast mammography 10/26/2014 was negative, and bilateral breast MRI the same day confirmed, in the right breast, and upper outer quadrant mass measuring 2.5 cm, with a second mass measuring 1.4 cm slightly anterior and superior to it. In addition there was a total area of approximately 6.5 cm of non-masslike enhancement extending throughout the upper outer quadrant of the right breast. There was a cortically thickened right axillary lymph node which had been biopsied. There was also a mildly cortically thickened right subpectoral lymph node. There were no internal mammary or left axillary lymph nodes in the left breast was unremarkable.  The patient's subsequent history is as detailed below.  INTERVAL HISTORY: [name] [name] returns today for follow up of her breast cancer. Today is day 1, cycle 3 of cyclophosphamide and doxorubicin, with neulasta given on day 2 for granulocyte support. She developed a temperature of 99.9 on Tuesday and Wednesday. She called the triage line and was advised to take tylenol q4-6hrs PRN. She has not had any elevated temperatures since then.  REVIEW OF SYSTEMS: [name] [name] denies fevers, chills, nausea, vomiting, or changes in bowel or bladder habits. She is eating and drinking well. Her energy level is fair, but she does not sleep well at night. She has no mouth sores or rashes, and the baseline numbness to her finger tips is no worse. She has no headaches, dizziness, unexplained weight loss, or night sweats. A detailed review of systems is otherwise entirely negative.   PAST MEDICAL HISTORY: Past Medical History  Diagnosis Date  . METABOLIC SYNDROME X   . OBESITY, TRUNCAL   . CONSTIPATION, CHRONIC   . KELOID   .  Multiple sclerosis   . Allergic rhinitis due to other allergen   . HYPERTENSION   . HYPERLIPIDEMIA   . GERD   . Angioedema     ACEI  . Colon polyps 05/2014    adenomatous, colo q  5y  . Migraine headache   . Allergy   . Anxiety   . Breast cancer of upper-outer quadrant of right female breast 10/23/2014  . Hot flashes     PAST SURGICAL HISTORY: Past Surgical History  Procedure Laterality Date  . Tubal ligation    . Colonoscopy    . Portacath placement Left 11/06/2014    Procedure: INSERTION PORT-A-CATH;  Surgeon: [name], MD;  Location: [facility];  Service: General;  Laterality: Left;    FAMILY HISTORY Family History  Problem Relation Age of Onset  . Coronary artery disease Mother   . Heart disease Mother   . Diabetes Mother   . Hypertension Other   . Hyperlipidemia Other   . Diabetes Other   . Colon cancer Neg Hx   . Alcohol abuse Father   . COPD Maternal Grandmother    the patient's father died at the age of 51 following his seizure. The patient's mother died from congestive 40 failure at the age of 63. The patient had 5 brothers and 2 sisters. One brother died from Escherichia coli infection, the other one had a history of seizure disorder. One sister died from causes unknown to the patient. There is no history of breast or ovarian cancer in the family.  GYNECOLOGIC HISTORY:  Patient's last menstrual period was 11/01/2012. Menarche age 26, first live birth age 65. The patient is GX P2. She stopped having periods in May 2014. She did not take hormone replacement. She did take her control pills remotely for approximately 4 years with no complications.  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 Her daughter [name] [name] lives in [place] and works in Ambulance person for [employer]. Son [name] [name] is an Engineer, building services for [employer]: Not in place   HEALTH MAINTENANCE: History  Substance Use Topics  . Smoking status: Never Smoker   . Smokeless tobacco: Never Used      Comment: Single- brother staying with pt since mom's death  . Alcohol Use: No     Colonoscopy: September 2015/[name]  PAP: February 2015  Bone density:  Lipid panel:  Allergies  Allergen Reactions  . Ace Inhibitors Anaphylaxis    REACTION: Angioedema (tongue swelling)  . Clarithromycin Anaphylaxis    Throat swells  . Levaquin [Levofloxacin] Other (See Comments)    Tendon pain - shoulder and calf    Current Outpatient Prescriptions  Medication Sig Dispense Refill  . ALPRAZolam (XANAX) 0.5 MG tablet Take 1 tablet (0.5 mg total) by mouth 3 (three) times daily as needed for anxiety or sleep. 30 tablet 1  . dexamethasone (DECADRON) 4 MG tablet Take 2 tablets by mouth once a day on the day after chemotherapy and then take 2 tablets two times a day for 2 days. Take with food. 30 tablet 1  . Glatiramer Acetate (COPAXONE) 40 MG/ML SOSY Inject 40 mg into the skin 3 (three) times a week. 12 Syringe 0  . hydrochlorothiazide (HYDRODIURIL) 25 MG tablet Take 1 tablet (25 mg total) by mouth daily. 90 tablet 1  . lidocaine-prilocaine (EMLA) cream Apply to  affected area once 30 g 3  . loratadine-pseudoephedrine (CLARITIN-D 12 HOUR) 5-120 MG per tablet Take 1 tablet by mouth as needed for allergies. 30 tablet 3  . losartan (COZAAR) 50 MG tablet Take 1 tablet (50 mg total) by mouth 2 (two) times daily. 180 tablet 3  . metoprolol (LOPRESSOR) 50 MG tablet Take 1 tablet (50 mg total) by mouth 2 (two) times daily. 180 tablet 1  . ondansetron (ZOFRAN) 8 MG tablet Take 1 tablet (8 mg total) by mouth 2 (two) times daily as needed. Start on the third day after chemotherapy. 30 tablet 1  . pantoprazole (PROTONIX) 40 MG tablet Take 1 tablet (40 mg total) by mouth daily. 90 tablet 1  . potassium chloride (MICRO-K) 10 MEQ CR capsule Take 2 capsules (20 mEq total) by mouth 2 (two) times daily. 60 capsule 1  . prochlorperazine (COMPAZINE) 10 MG tablet Take 1 tablet (10 mg total) by mouth every 6 (six) hours as  needed (Nausea or vomiting). (Patient not taking: Reported on 12/17/2014) 30 tablet 1  . [DISCONTINUED] mometasone (NASONEX) 50 MCG/ACT nasal spray Place 2 sprays into the nose daily. 17 g 2   No current facility-administered medications for this visit.    OBJECTIVE: Middle-aged African-American woman who appears well There were no vitals filed for this visit.   There is no weight on file to calculate BMI.    ECOG FS:0 - Asymptomatic   Sclerae unicteric, pupils equal and reactive Oropharynx clear and moist-- no thrush No cervical or supraclavicular adenopathy Lungs no rales or rhonchi Heart regular rate and rhythm Abd soft, nontender, positive bowel sounds MSK no focal spinal tenderness, no upper extremity lymphedema Neuro: nonfocal, well oriented, appropriate affect Breasts: deferred  LAB RESULTS:  CMP     Component Value Date/Time   NA 139 12/17/2014 1035   NA 137 01/14/2014 1206   K 3.4* 12/17/2014 1035   K 3.6 01/14/2014 1206   CL 102 01/14/2014 1206   CO2 24 12/17/2014 1035   CO2 27 01/14/2014 1206   GLUCOSE 112 12/17/2014 1035   GLUCOSE 90 01/14/2014 1206   BUN 9.8 12/17/2014 1035   BUN 16 01/14/2014 1206   CREATININE 0.8 12/17/2014 1035   CREATININE 0.9 01/14/2014 1206   CALCIUM 9.6 12/17/2014 1035   CALCIUM 9.5 01/14/2014 1206   PROT 7.1 12/17/2014 1035   PROT 7.5 01/14/2014 1206   ALBUMIN 3.7 12/17/2014 1035   ALBUMIN 4.2 01/14/2014 1206   AST 12 12/17/2014 1035   AST 22 01/14/2014 1206   ALT 14 12/17/2014 1035   ALT 20 01/14/2014 1206   ALKPHOS 111 12/17/2014 1035   ALKPHOS 77 01/14/2014 1206   BILITOT 0.35 12/17/2014 1035   BILITOT 0.6 01/14/2014 1206   GFRNONAA 98.72 09/30/2009 1023   GFRAA 87 12/30/2007 0842    INo results found for: SPEP, UPEP  Lab Results  Component Value Date   WBC 8.9 12/17/2014   NEUTROABS 6.9* 12/17/2014   HGB 11.8 12/17/2014   HCT 36.1 12/17/2014   MCV 85.8 12/17/2014   PLT 197 12/17/2014      Chemistry        Component Value Date/Time   NA 139 12/17/2014 1035   NA 137 01/14/2014 1206   K 3.4* 12/17/2014 1035   K 3.6 01/14/2014 1206   CL 102 01/14/2014 1206   CO2 24 12/17/2014 1035   CO2 27 01/14/2014 1206   BUN 9.8 12/17/2014 1035   BUN 16 01/14/2014 1206  CREATININE 0.8 12/17/2014 1035   CREATININE 0.9 01/14/2014 1206      Component Value Date/Time   CALCIUM 9.6 12/17/2014 1035   CALCIUM 9.5 01/14/2014 1206   ALKPHOS 111 12/17/2014 1035   ALKPHOS 77 01/14/2014 1206   AST 12 12/17/2014 1035   AST 22 01/14/2014 1206   ALT 14 12/17/2014 1035   ALT 20 01/14/2014 1206   BILITOT 0.35 12/17/2014 1035   BILITOT 0.6 01/14/2014 1206       No results found for: LABCA2  No components found for: LABCA125  No results for input(s): INR in the last 168 hours.  Urinalysis    Component Value Date/Time   COLORURINE YELLOW 01/14/2014 1206   APPEARANCEUR CLEAR 01/14/2014 1206   LABSPEC <=1.005* 01/14/2014 1206   PHURINE 5.5 01/14/2014 1206   GLUCOSEU NEGATIVE 01/14/2014 1206   HGBUR NEGATIVE 01/14/2014 1206   HGBUR negative 08/19/2010 1041   BILIRUBINUR NEGATIVE 01/14/2014 1206   KETONESUR NEGATIVE 01/14/2014 1206   UROBILINOGEN 0.2 01/14/2014 1206   NITRITE NEGATIVE 01/14/2014 1206   LEUKOCYTESUR NEGATIVE 01/14/2014 1206    STUDIES: No results found.  ASSESSMENT: 54 y.o. 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 The labs were reviewed in detail and her [place] is up to 6.9 today. She will proceed with cycle 3 of cyclophosphamide and doxorubicin as planned today.  [name] [name] will return next week for labs and a nadir visit. She understands and agrees with this plan. She knows the goal of treatment in her case is cure. She has been encouraged to call with any issues that might arise before her next visit here.  Laurie Panda, NP   12/17/2014 11:37 AM

## 2014-12-18 ENCOUNTER — Other Ambulatory Visit: Payer: Self-pay | Admitting: Internal Medicine

## 2014-12-18 ENCOUNTER — Other Ambulatory Visit: Payer: Self-pay | Admitting: Nurse Practitioner

## 2014-12-18 ENCOUNTER — Ambulatory Visit (HOSPITAL_BASED_OUTPATIENT_CLINIC_OR_DEPARTMENT_OTHER): Payer: BC Managed Care – PPO

## 2014-12-18 ENCOUNTER — Telehealth: Payer: Self-pay | Admitting: Oncology

## 2014-12-18 DIAGNOSIS — C50811 Malignant neoplasm of overlapping sites of right female breast: Secondary | ICD-10-CM

## 2014-12-18 DIAGNOSIS — C50411 Malignant neoplasm of upper-outer quadrant of right female breast: Secondary | ICD-10-CM

## 2014-12-18 DIAGNOSIS — Z5189 Encounter for other specified aftercare: Secondary | ICD-10-CM | POA: Diagnosis not present

## 2014-12-18 MED ORDER — PEGFILGRASTIM INJECTION 6 MG/0.6ML ~~LOC~~
6.0000 mg | PREFILLED_SYRINGE | Freq: Once | SUBCUTANEOUS | Status: AC
Start: 2014-12-18 — End: 2014-12-18
  Administered 2014-12-18: 6 mg via SUBCUTANEOUS
  Filled 2014-12-18: qty 0.6

## 2014-12-18 NOTE — Telephone Encounter (Signed)
Per pof and patient request her injection was moved to the flush room   anne

## 2014-12-22 ENCOUNTER — Encounter: Payer: Self-pay | Admitting: Oncology

## 2014-12-22 NOTE — Progress Notes (Signed)
I faxed medical records and labs for Dr. Jana Hakim to  [phone]. They wanted from 03/18/13-present. I had already faxed 11/13/14-11/19/14.

## 2014-12-24 ENCOUNTER — Telehealth: Payer: Self-pay | Admitting: *Deleted

## 2014-12-24 ENCOUNTER — Other Ambulatory Visit: Payer: Self-pay | Admitting: Nurse Practitioner

## 2014-12-24 ENCOUNTER — Ambulatory Visit (HOSPITAL_BASED_OUTPATIENT_CLINIC_OR_DEPARTMENT_OTHER): Payer: BC Managed Care – PPO | Admitting: Nurse Practitioner

## 2014-12-24 ENCOUNTER — Encounter: Payer: Self-pay | Admitting: Nurse Practitioner

## 2014-12-24 ENCOUNTER — Other Ambulatory Visit (HOSPITAL_BASED_OUTPATIENT_CLINIC_OR_DEPARTMENT_OTHER): Payer: BC Managed Care – PPO

## 2014-12-24 VITALS — BP 105/56 | HR 93 | Temp 98.4°F | Resp 18 | Ht 66.0 in | Wt 216.0 lb

## 2014-12-24 DIAGNOSIS — Z171 Estrogen receptor negative status [ER-]: Secondary | ICD-10-CM

## 2014-12-24 DIAGNOSIS — D701 Agranulocytosis secondary to cancer chemotherapy: Secondary | ICD-10-CM

## 2014-12-24 DIAGNOSIS — C50811 Malignant neoplasm of overlapping sites of right female breast: Secondary | ICD-10-CM

## 2014-12-24 DIAGNOSIS — I10 Essential (primary) hypertension: Secondary | ICD-10-CM

## 2014-12-24 DIAGNOSIS — T451X5A Adverse effect of antineoplastic and immunosuppressive drugs, initial encounter: Secondary | ICD-10-CM

## 2014-12-24 DIAGNOSIS — G35 Multiple sclerosis: Secondary | ICD-10-CM

## 2014-12-24 DIAGNOSIS — C50411 Malignant neoplasm of upper-outer quadrant of right female breast: Secondary | ICD-10-CM

## 2014-12-24 DIAGNOSIS — R5383 Other fatigue: Secondary | ICD-10-CM | POA: Diagnosis not present

## 2014-12-24 LAB — COMPREHENSIVE METABOLIC PANEL (CC13)
ALBUMIN: 3.4 g/dL — AB (ref 3.5–5.0)
ALT: 13 U/L (ref 0–55)
AST: 9 U/L (ref 5–34)
Alkaline Phosphatase: 97 U/L (ref 40–150)
Anion Gap: 11 mEq/L (ref 3–11)
BUN: 8.9 mg/dL (ref 7.0–26.0)
CO2: 27 mEq/L (ref 22–29)
CREATININE: 0.7 mg/dL (ref 0.6–1.1)
Calcium: 9 mg/dL (ref 8.4–10.4)
Chloride: 102 mEq/L (ref 98–109)
EGFR: >90 mL/min/{1.73_m2} (ref >90)
Glucose: 117 mg/dl (ref 70–140)
Potassium: 3.4 mEq/L — ABNORMAL LOW (ref 3.5–5.1)
Sodium: 140 mEq/L (ref 136–145)
Total Bilirubin: 0.55 mg/dL (ref 0.20–1.20)
Total Protein: 6.3 g/dL — ABNORMAL LOW (ref 6.4–8.3)

## 2014-12-24 LAB — CBC WITH DIFFERENTIAL/PLATELET
BASO%: 3.2 % — ABNORMAL HIGH (ref 0.0–2.0)
BASOS ABS: 0 10*3/uL (ref 0.0–0.1)
EOS ABS: 0 10*3/uL (ref 0.0–0.5)
EOS%: 3.2 % (ref 0.0–7.0)
HCT: 30.6 % — ABNORMAL LOW (ref 34.8–46.6)
HEMOGLOBIN: 10.6 g/dL — AB (ref 11.6–15.9)
LYMPH#: 0.4 10*3/uL — AB (ref 0.9–3.3)
LYMPH%: 59.7 % — ABNORMAL HIGH (ref 14.0–49.7)
MCH: 28.6 pg (ref 25.1–34.0)
MCHC: 34.6 g/dL (ref 31.5–36.0)
MCV: 82.7 fL (ref 79.5–101.0)
MONO#: 0.1 10*3/uL (ref 0.1–0.9)
MONO%: 17.7 % — ABNORMAL HIGH (ref 0.0–14.0)
NEUT%: 16.2 % — AB (ref 38.4–76.8)
NEUTROS ABS: 0.1 10*3/uL — AB (ref 1.5–6.5)
Platelets: 163 10*3/uL (ref 145–400)
RBC: 3.7 10*6/uL (ref 3.70–5.45)
RDW: 15 % — AB (ref 11.2–14.5)
WBC: 0.6 10*3/uL — CL (ref 3.9–10.3)
nRBC: 0 % (ref 0–0)

## 2014-12-24 MED ORDER — CIPROFLOXACIN HCL 500 MG PO TABS: 500.0000 mg | ORAL_TABLET | Freq: Two times a day (BID) | ORAL | Status: DC

## 2014-12-24 NOTE — Telephone Encounter (Signed)
Cipro sent to [facility].  This RN post pt calling regarding the above- called Cipro to local pharmacy.  Pt aware and will pick up now.

## 2014-12-24 NOTE — Progress Notes (Signed)
[place]  [phone] [phone]) [phone]     ID: [name] [name] DOB: Jul 05, 1961  MR#: [id number]  LZJ#:[id number]  Patient Care Team: [name], MD as PCP - General [name], MD as Consulting Physician (Obstetrics and Gynecology) [name], MD (Gastroenterology) [name]. [name], MD (Neurology) [name], MD as Consulting Physician (General Surgery) [name], MD as Consulting Physician (Oncology) [name], MD as Attending Physician (Radiation Oncology) [name], RN as Registered Nurse [name], RN as Registered Nurse OTHER MD:  CHIEF COMPLAINT: Triple negative breast cancer CURRENT TREATMENT: Neoadjuvant chemotherapy  BREAST CANCER HISTORY: From the original intake note:  [name] [name] had bilateral screening mammography at the [facility] 11/21/2013. Breast density was category C. Exam was unremarkable. In January 2016 however the patient palpated a change in her right breast laterally. She contacted her gynecologist, Dr. [name], and he set her up for a unilateral right diagnostic mammography with right breast ultrasonography at [facility] 10/15/2014. There was a focal irregular opacity in the lateral right breast associated with pleomorphic calcifications. This was palpable on exam as a firm non-mobile mass. Ultrasound confirmed a hypoechoic mass in the right breast at the 9:00 position 5 cm from the nipple, measuring 2.9 cm. Next to this mass was a normal-sized intramammary lymph node measuring 4 mm. In the right axilla there were 3 contiguous level I lymph nodes the largest measuring 11 mm.  On 10/20/2014 the patient underwent right core needle biopsy of the 9:00 breast mass, showing ([id number]) and invasive ductal carcinoma, grade 3, triple negative, with the HER-2/neu signals ratio of 1.26 and number per cell 2.15. The MIB-1 was 71%. Biopsy of the largest right axillary lymph node was negative. Dr.  Owens Shark the mammographer who performed a biopsy describes this is concordant.  Left breast mammography 10/26/2014 was negative, and bilateral breast MRI the same day confirmed, in the right breast, and upper outer quadrant mass measuring 2.5 cm, with a second mass measuring 1.4 cm slightly anterior and superior to it. In addition there was a total area of approximately 6.5 cm of non-masslike enhancement extending throughout the upper outer quadrant of the right breast. There was a cortically thickened right axillary lymph node which had been biopsied. There was also a mildly cortically thickened right subpectoral lymph node. There were no internal mammary or left axillary lymph nodes in the left breast was unremarkable.  The patient's subsequent history is as detailed below.  INTERVAL HISTORY: [name] [name] returns today for follow up of her breast cancer. Today is day 8, cycle 3 of cyclophosphamide and doxorubicin, with neulasta given on day 2 for granulocyte support.   REVIEW OF SYSTEMS: [name] [name] denies fevers, chills, nausea, vomiting, or changes in bowel or bladder habits. She is eating and drinking well, despite taste changes. Her energy is worse this week than usual, and she continues to have trouble sleeping at night. She had a few episodes of dizziness after standing up too quickly. She is battling a teary left eye from allergies presumably. She has no mouth sores or rashes, and the baseline numbness to her finger tips is no worse. A detailed review of systems is otherwise entirely negative.   PAST MEDICAL HISTORY: Past Medical History  Diagnosis Date  . METABOLIC SYNDROME X   . OBESITY, TRUNCAL   . CONSTIPATION, CHRONIC   . KELOID   . Multiple sclerosis   . Allergic rhinitis due to other allergen   . HYPERTENSION   .  HYPERLIPIDEMIA   . GERD   . Angioedema     ACEI  . Colon polyps 05/2014    adenomatous, colo q 5y  . Migraine headache   . Allergy   . Anxiety   . Breast cancer of upper-outer  quadrant of right female breast 10/23/2014  . Hot flashes     PAST SURGICAL HISTORY: Past Surgical History  Procedure Laterality Date  . Tubal ligation    . Colonoscopy    . Portacath placement Left 11/06/2014    Procedure: INSERTION PORT-A-CATH;  Surgeon: [name], MD;  Location: [facility];  Service: General;  Laterality: Left;    FAMILY HISTORY Family History  Problem Relation Age of Onset  . Coronary artery disease Mother   . Heart disease Mother   . Diabetes Mother   . Hypertension Other   . Hyperlipidemia Other   . Diabetes Other   . Colon cancer Neg Hx   . Alcohol abuse Father   . COPD Maternal Grandmother    the patient's father died at the age of 34 following his seizure. The patient's mother died from congestive 11 failure at the age of 10. The patient had 5 brothers and 2 sisters. One brother died from Escherichia coli infection, the other one had a history of seizure disorder. One sister died from causes unknown to the patient. There is no history of breast or ovarian cancer in the family.  GYNECOLOGIC HISTORY:  Patient's last menstrual period was 11/01/2012. Menarche age 7, first live birth age 27. The patient is GX P2. She stopped having periods in May 2014. She did not take hormone replacement. She did take her control pills remotely for approximately 4 years with no complications.  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 Her daughter [name] [name] lives in [place] and works in Ambulance person for [employer]. Son [name] [name] is an Engineer, building services for [employer]: Not in place   HEALTH MAINTENANCE: History  Substance Use Topics  . Smoking status: Never Smoker   . Smokeless tobacco: Never Used     Comment: Single- brother staying with pt since mom's death  . Alcohol Use: No      Colonoscopy: September 2015/[name] [name]  PAP: February 2015  Bone density:  Lipid panel:  Allergies  Allergen Reactions  . Ace Inhibitors Anaphylaxis    REACTION: Angioedema (tongue swelling)  . Clarithromycin Anaphylaxis    Throat swells  . Levaquin [Levofloxacin] Other (See Comments)    Tendon pain - shoulder and calf    Current Outpatient Prescriptions  Medication Sig Dispense Refill  . Glatiramer Acetate (COPAXONE) 40 MG/ML SOSY Inject 40 mg into the skin 3 (three) times a week. 12 Syringe 0  . hydrochlorothiazide (HYDRODIURIL) 25 MG tablet Take 1 tablet (25 mg total) by mouth daily. 90 tablet 1  . loratadine-pseudoephedrine (CLARITIN-D 12 HOUR) 5-120 MG per tablet Take 1 tablet by mouth as needed for allergies. 30 tablet 3  . losartan (COZAAR) 50 MG tablet Take 1 tablet (50 mg total) by mouth 2 (two) times daily. 180 tablet 3  . metoprolol (LOPRESSOR) 50 MG tablet TAKE 1 TABLET TWICE A DAY 180 tablet 3  . ondansetron (ZOFRAN) 8 MG tablet Take 1 tablet (8 mg total) by mouth 2 (two) times daily as needed. Start on the third day after chemotherapy. 30 tablet 1  .  pantoprazole (PROTONIX) 40 MG tablet Take 1 tablet (40 mg total) by mouth daily. 90 tablet 1  . potassium chloride (MICRO-K) 10 MEQ CR capsule Take 2 capsules (20 mEq total) by mouth 2 (two) times daily. (Patient taking differently: Take 20 mEq by mouth daily. ) 60 capsule 1  . ALPRAZolam (XANAX) 0.5 MG tablet Take 1 tablet (0.5 mg total) by mouth 3 (three) times daily as needed for anxiety or sleep. (Patient not taking: Reported on 12/24/2014) 30 tablet 1  . ciprofloxacin (CIPRO) 500 MG tablet Take 1 tablet (500 mg total) by mouth 2 (two) times daily. 10 tablet 0  . dexamethasone (DECADRON) 4 MG tablet Take 2 tablets by mouth once a day on the day after chemotherapy and then take 2 tablets two times a day for 2 days. Take with food. (Patient not taking: Reported on 12/24/2014) 30 tablet 1  . lidocaine-prilocaine (EMLA) cream  Apply to affected area once (Patient not taking: Reported on 12/24/2014) 30 g 3  . prochlorperazine (COMPAZINE) 10 MG tablet Take 1 tablet (10 mg total) by mouth every 6 (six) hours as needed (Nausea or vomiting). (Patient not taking: Reported on 12/17/2014) 30 tablet 1  . [DISCONTINUED] mometasone (NASONEX) 50 MCG/ACT nasal spray Place 2 sprays into the nose daily. 17 g 2   No current facility-administered medications for this visit.    OBJECTIVE: Middle-aged African-American woman who appears well Filed Vitals:   12/24/14 1136  BP: 105/56  [name]: 93  Temp: 98.4 F (36.9 C)  Resp: 18     Body mass index is 34.88 kg/(m^2).    ECOG FS:0 - Asymptomatic  Skin: warm, dry  HEENT: sclerae anicteric, conjunctivae pink, oropharynx clear. No thrush or mucositis.  Lymph Nodes: No cervical or supraclavicular lymphadenopathy  Lungs: clear to auscultation bilaterally, no rales, wheezes, or rhonci  Heart: regular rate and rhythm  Abdomen: round, soft, non tender, positive bowel sounds  Musculoskeletal: No focal spinal tenderness, no peripheral edema  Neuro: non focal, well oriented, positive affect  Breasts: deferred  LAB RESULTS:  CMP     Component Value Date/Time   NA 140 12/24/2014 1102   NA 137 01/14/2014 1206   K 3.4* 12/24/2014 1102   K 3.6 01/14/2014 1206   CL 102 01/14/2014 1206   CO2 27 12/24/2014 1102   CO2 27 01/14/2014 1206   GLUCOSE 117 12/24/2014 1102   GLUCOSE 90 01/14/2014 1206   BUN 8.9 12/24/2014 1102   BUN 16 01/14/2014 1206   CREATININE 0.7 12/24/2014 1102   CREATININE 0.9 01/14/2014 1206   CALCIUM 9.0 12/24/2014 1102   CALCIUM 9.5 01/14/2014 1206   PROT 6.3* 12/24/2014 1102   PROT 7.5 01/14/2014 1206   ALBUMIN 3.4* 12/24/2014 1102   ALBUMIN 4.2 01/14/2014 1206   AST 9 12/24/2014 1102   AST 22 01/14/2014 1206   ALT 13 12/24/2014 1102   ALT 20 01/14/2014 1206   ALKPHOS 97 12/24/2014 1102   ALKPHOS 77 01/14/2014 1206   BILITOT 0.55 12/24/2014 1102   BILITOT  0.6 01/14/2014 1206   GFRNONAA 98.72 09/30/2009 1023   GFRAA 87 12/30/2007 0842    INo results found for: SPEP, UPEP  Lab Results  Component Value Date   WBC 0.6* 12/24/2014   NEUTROABS 0.1* 12/24/2014   HGB 10.6* 12/24/2014   HCT 30.6* 12/24/2014   MCV 82.7 12/24/2014   PLT 163 12/24/2014      Chemistry      Component Value Date/Time   NA  140 12/24/2014 1102   NA 137 01/14/2014 1206   K 3.4* 12/24/2014 1102   K 3.6 01/14/2014 1206   CL 102 01/14/2014 1206   CO2 27 12/24/2014 1102   CO2 27 01/14/2014 1206   BUN 8.9 12/24/2014 1102   BUN 16 01/14/2014 1206   CREATININE 0.7 12/24/2014 1102   CREATININE 0.9 01/14/2014 1206      Component Value Date/Time   CALCIUM 9.0 12/24/2014 1102   CALCIUM 9.5 01/14/2014 1206   ALKPHOS 97 12/24/2014 1102   ALKPHOS 77 01/14/2014 1206   AST 9 12/24/2014 1102   AST 22 01/14/2014 1206   ALT 13 12/24/2014 1102   ALT 20 01/14/2014 1206   BILITOT 0.55 12/24/2014 1102   BILITOT 0.6 01/14/2014 1206       No results found for: LABCA2  No components found for: LABCA125  No results for input(s): INR in the last 168 hours.  Urinalysis    Component Value Date/Time   COLORURINE YELLOW 01/14/2014 1206   APPEARANCEUR CLEAR 01/14/2014 1206   LABSPEC <=1.005* 01/14/2014 1206   PHURINE 5.5 01/14/2014 1206   GLUCOSEU NEGATIVE 01/14/2014 1206   HGBUR NEGATIVE 01/14/2014 1206   HGBUR negative 08/19/2010 1041   BILIRUBINUR NEGATIVE 01/14/2014 1206   KETONESUR NEGATIVE 01/14/2014 1206   UROBILINOGEN 0.2 01/14/2014 1206   NITRITE NEGATIVE 01/14/2014 1206   LEUKOCYTESUR NEGATIVE 01/14/2014 1206    STUDIES: No results found.  ASSESSMENT: 54 y.o. 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 The labs were reviewed in detail and her [place] is down to 0.1 today. We reviewed neutropenic precautions, including good hand hygiene, avoiding crowds and sick contacts, and alerting [place] of any temperatures above 100.5. I have sent a prescription for cipro $RemoveB'500mg'uOiwgbrO$  BID x 5 days as a prophylactic antibiotic regimen.   [name] [name] [name] return next week for her 4th and final cycle of cyclophosphamide and doxorubicin. She understands and agrees with this plan. She knows the goal of treatment in her case is cure. She has been encouraged to call with any issues that might arise before her next visit here.  Laurie Panda, NP   12/24/2014 12:14 PM

## 2014-12-25 ENCOUNTER — Other Ambulatory Visit: Payer: Self-pay | Admitting: Internal Medicine

## 2014-12-25 ENCOUNTER — Telehealth: Payer: Self-pay | Admitting: *Deleted

## 2014-12-25 ENCOUNTER — Telehealth: Payer: Self-pay | Admitting: Nurse Practitioner

## 2014-12-25 NOTE — Telephone Encounter (Signed)
per pof to sch pt appt-sent email to GM to sch pt trmt-pt has [facility] stated will see update

## 2014-12-25 NOTE — Telephone Encounter (Signed)
Per staff message and POF I have scheduled appts. Advised scheduler of appts. JMW  

## 2014-12-28 ENCOUNTER — Other Ambulatory Visit: Payer: Self-pay | Admitting: *Deleted

## 2014-12-28 MED ORDER — POTASSIUM CHLORIDE ER 10 MEQ PO CPCR: 20.0000 meq | ORAL_CAPSULE | Freq: Two times a day (BID) | ORAL | Status: DC

## 2014-12-29 ENCOUNTER — Other Ambulatory Visit: Payer: Self-pay | Admitting: *Deleted

## 2014-12-29 MED ORDER — POTASSIUM CHLORIDE ER 10 MEQ PO CPCR: 10.0000 meq | ORAL_CAPSULE | Freq: Two times a day (BID) | ORAL | Status: DC

## 2014-12-31 ENCOUNTER — Ambulatory Visit (HOSPITAL_COMMUNITY)
Admission: RE | Admit: 2014-12-31 | Discharge: 2014-12-31 | Disposition: A | Discharge status: Home / Self Care | Payer: BC Managed Care – PPO | Source: Ambulatory Visit | Attending: Nurse Practitioner | Admitting: Nurse Practitioner

## 2014-12-31 ENCOUNTER — Ambulatory Visit (HOSPITAL_BASED_OUTPATIENT_CLINIC_OR_DEPARTMENT_OTHER): Payer: BC Managed Care – PPO | Admitting: Nurse Practitioner

## 2014-12-31 ENCOUNTER — Telehealth: Payer: Self-pay | Admitting: *Deleted

## 2014-12-31 ENCOUNTER — Ambulatory Visit (HOSPITAL_BASED_OUTPATIENT_CLINIC_OR_DEPARTMENT_OTHER): Payer: BC Managed Care – PPO

## 2014-12-31 ENCOUNTER — Other Ambulatory Visit: Payer: BC Managed Care – PPO

## 2014-12-31 ENCOUNTER — Telehealth: Payer: Self-pay | Admitting: Nurse Practitioner

## 2014-12-31 ENCOUNTER — Other Ambulatory Visit (HOSPITAL_COMMUNITY)
Admission: AD | Admit: 2014-12-31 | Discharge: 2014-12-31 | Disposition: A | Discharge status: Home / Self Care | Payer: BC Managed Care – PPO | Source: Ambulatory Visit | Attending: Oncology | Admitting: Oncology

## 2014-12-31 ENCOUNTER — Other Ambulatory Visit (HOSPITAL_BASED_OUTPATIENT_CLINIC_OR_DEPARTMENT_OTHER): Payer: BC Managed Care – PPO

## 2014-12-31 ENCOUNTER — Encounter: Payer: Self-pay | Admitting: Lab

## 2014-12-31 ENCOUNTER — Other Ambulatory Visit: Payer: Self-pay

## 2014-12-31 ENCOUNTER — Encounter: Payer: Self-pay | Admitting: Nurse Practitioner

## 2014-12-31 VITALS — BP 125/70 | HR 123 | Temp 100.3°F | Resp 22 | Ht 66.0 in | Wt 212.9 lb

## 2014-12-31 DIAGNOSIS — G35 Multiple sclerosis: Secondary | ICD-10-CM

## 2014-12-31 DIAGNOSIS — C50811 Malignant neoplasm of overlapping sites of right female breast: Secondary | ICD-10-CM

## 2014-12-31 DIAGNOSIS — C50411 Malignant neoplasm of upper-outer quadrant of right female breast: Secondary | ICD-10-CM

## 2014-12-31 DIAGNOSIS — R509 Fever, unspecified: Secondary | ICD-10-CM | POA: Insufficient documentation

## 2014-12-31 DIAGNOSIS — I10 Essential (primary) hypertension: Secondary | ICD-10-CM

## 2014-12-31 DIAGNOSIS — E876 Hypokalemia: Secondary | ICD-10-CM | POA: Diagnosis not present

## 2014-12-31 DIAGNOSIS — C50919 Malignant neoplasm of unspecified site of unspecified female breast: Secondary | ICD-10-CM | POA: Insufficient documentation

## 2014-12-31 LAB — CBC WITH DIFFERENTIAL/PLATELET
BASO%: 0.7 % (ref 0.0–2.0)
Basophils Absolute: 0.1 10*3/uL (ref 0.0–0.1)
EOS%: 0.4 % (ref 0.0–7.0)
Eosinophils Absolute: 0 10*3/uL (ref 0.0–0.5)
HCT: 31.1 % — ABNORMAL LOW (ref 34.8–46.6)
HGB: 10.3 g/dL — ABNORMAL LOW (ref 11.6–15.9)
LYMPH%: 5.3 % — AB (ref 14.0–49.7)
MCH: 27.9 pg (ref 25.1–34.0)
MCHC: 33.2 g/dL (ref 31.5–36.0)
MCV: 84 fL (ref 79.5–101.0)
MONO#: 1.5 10*3/uL — ABNORMAL HIGH (ref 0.1–0.9)
MONO%: 12.5 % (ref 0.0–14.0)
NEUT#: 9.5 10*3/uL — ABNORMAL HIGH (ref 1.5–6.5)
NEUT%: 81.1 % — ABNORMAL HIGH (ref 38.4–76.8)
PLATELETS: 274 10*3/uL (ref 145–400)
RBC: 3.7 10*6/uL (ref 3.70–5.45)
RDW: 16.3 % — ABNORMAL HIGH (ref 11.2–14.5)
WBC: 11.7 10*3/uL — AB (ref 3.9–10.3)
lymph#: 0.6 10*3/uL — ABNORMAL LOW (ref 0.9–3.3)

## 2014-12-31 LAB — COMPREHENSIVE METABOLIC PANEL
ALBUMIN: 4 g/dL (ref 3.5–5.2)
ALT: 13 U/L (ref 0–35)
AST: 17 U/L (ref 0–37)
Alkaline Phosphatase: 101 U/L (ref 39–117)
Anion gap: 10 (ref 5–15)
BILIRUBIN TOTAL: 0.4 mg/dL (ref 0.3–1.2)
BUN: 9 mg/dL (ref 6–23)
CALCIUM: 9 mg/dL (ref 8.4–10.5)
CO2: 26 mmol/L (ref 19–32)
Chloride: 97 mmol/L (ref 96–112)
Creatinine, Ser: 0.82 mg/dL (ref 0.50–1.10)
GFR calc non Af Amer: 81 mL/min — ABNORMAL LOW (ref >90)
Glucose, Bld: 129 mg/dL — ABNORMAL HIGH (ref 70–99)
Potassium: 2.7 mmol/L — CL (ref 3.5–5.1)
SODIUM: 133 mmol/L — AB (ref 135–145)
Total Protein: 7.3 g/dL (ref 6.0–8.3)

## 2014-12-31 MED ORDER — SODIUM CHLORIDE 0.9 % IJ SOLN
10.0000 mL | INTRAMUSCULAR | Status: DC | PRN
  Filled 2014-12-31: qty 10

## 2014-12-31 MED ORDER — PROMETHAZINE HCL 25 MG/ML IJ SOLN
25.0000 mg | Freq: Once | INTRAMUSCULAR | Status: DC
  Filled 2014-12-31: qty 1

## 2014-12-31 MED ORDER — SODIUM CHLORIDE 0.9 % IV SOLN
Freq: Once | INTRAVENOUS | Status: DC
  Filled 2014-12-31: qty 4

## 2014-12-31 MED ORDER — POTASSIUM CHLORIDE CRYS ER 20 MEQ PO TBCR
40.0000 meq | EXTENDED_RELEASE_TABLET | Freq: Once | ORAL | Status: AC
  Administered 2014-12-31: 40 meq via ORAL
  Filled 2014-12-31: qty 2

## 2014-12-31 MED ORDER — SODIUM CHLORIDE 0.9 % IV SOLN
Freq: Once | INTRAVENOUS | Status: AC
  Administered 2014-12-31: 13:00:00 via INTRAVENOUS

## 2014-12-31 MED ORDER — HEPARIN SOD (PORK) LOCK FLUSH 100 UNIT/ML IV SOLN
500.0000 [IU] | Freq: Once | INTRAVENOUS | Status: DC | PRN
  Filled 2014-12-31: qty 5

## 2014-12-31 MED ORDER — AMOXICILLIN-POT CLAVULANATE 875-125 MG PO TABS: 1.0000 | ORAL_TABLET | Freq: Two times a day (BID) | ORAL | Status: DC

## 2014-12-31 NOTE — Telephone Encounter (Signed)
Per staff message and POF I have scheduled appts. Advised scheduler of appts. JMW  

## 2014-12-31 NOTE — Progress Notes (Unsigned)
Critical potassium result called to Heather B at 11:40 am. BFM

## 2014-12-31 NOTE — Patient Instructions (Signed)

## 2014-12-31 NOTE — Telephone Encounter (Signed)
per pof to sch pt appt-sent MW email to sch pt appt-pt has MY CHART and will review updates

## 2014-12-31 NOTE — Progress Notes (Signed)
[facility]  [phone] [phone]     ID: [name] [name] DOB: [birth year]  MR#: [id number]  EXB#:[id number]  Patient Care Team: [name], MD as PCP - General [name], MD as Consulting Physician (Obstetrics and Gynecology) [name], MD (Gastroenterology) [name]. [name], MD (Neurology) [name], MD as Consulting Physician (General Surgery) [name], MD as Consulting Physician (Oncology) [name], MD as Attending Physician (Radiation Oncology) [name], RN as Registered Nurse [name], RN as Registered Nurse OTHER MD:  CHIEF COMPLAINT: Triple negative breast cancer CURRENT TREATMENT: Neoadjuvant chemotherapy  BREAST CANCER HISTORY: From the original intake note:  [name] [name] had bilateral screening mammography at the [facility] 11/21/2013. Breast density was category C. Exam was unremarkable. In January 2016 however the patient palpated a change in her right breast laterally. She contacted her gynecologist, Dr. [name], and he set her up for a unilateral right diagnostic mammography with right breast ultrasonography at [facility] 10/15/2014. There was a focal irregular opacity in the lateral right breast associated with pleomorphic calcifications. This was palpable on exam as a firm non-mobile mass. Ultrasound confirmed a hypoechoic mass in the right breast at the 9:00 position 5 cm from the nipple, measuring 2.9 cm. Next to this mass was a normal-sized intramammary lymph node measuring 4 mm. In the right axilla there were 3 contiguous level I lymph nodes the largest measuring 11 mm.  On 10/20/2014 the patient underwent right core needle biopsy of the 9:00 breast mass, showing ([id number]) and invasive ductal carcinoma, grade 3, triple negative, with the HER-2/neu signals ratio of 1.26 and number per cell 2.15. The MIB-1 was 71%. Biopsy of the largest right axillary lymph node was negative. Dr.  Owens Shark the mammographer who performed a biopsy describes this is concordant.  Left breast mammography 10/26/2014 was negative, and bilateral breast MRI the same day confirmed, in the right breast, and upper outer quadrant mass measuring 2.5 cm, with a second mass measuring 1.4 cm slightly anterior and superior to it. In addition there was a total area of approximately 6.5 cm of non-masslike enhancement extending throughout the upper outer quadrant of the right breast. There was a cortically thickened right axillary lymph node which had been biopsied. There was also a mildly cortically thickened right subpectoral lymph node. There were no internal mammary or left axillary lymph nodes in the left breast was unremarkable.  The patient's subsequent history is as detailed below.  INTERVAL HISTORY: [name] [name] returns today for follow up of her breast cancer. Today is day 1, cycle 4 of cyclophosphamide and doxorubicin, with neulasta given on day 2 for granulocyte support. She was started on cipro prophylactically on her nadir visit last week, for an ANC of 0.1. She began running mild temperatures over the weekend, and was advised by triage to take tylenol q 4hrs since she was already on antibiotics. The fever would go down temporarily, then climb again. She is 100.3 in office today, with a HR of 127.   REVIEW OF SYSTEMS: [name] [name] feels fine physically. She is no overly exhausted and has had no chills. She maintains that she has not even left the house int he past 4 days, and has washed her hands religiously. Her appetite remains healthy and she is moving her bowels well with no nausea. A detailed review of systems is otherwise stable.  PAST MEDICAL HISTORY: Past Medical History  Diagnosis Date  . METABOLIC SYNDROME X   .  OBESITY, TRUNCAL   . CONSTIPATION, CHRONIC   . KELOID   . Multiple sclerosis   . Allergic rhinitis due to other allergen   . HYPERTENSION   . HYPERLIPIDEMIA   . GERD   . Angioedema     ACEI   . Colon polyps 05/2014    adenomatous, colo q 5y  . Migraine headache   . Allergy   . Anxiety   . Breast cancer of upper-outer quadrant of right female breast 10/23/2014  . Hot flashes     PAST SURGICAL HISTORY: Past Surgical History  Procedure Laterality Date  . Tubal ligation    . Colonoscopy    . Portacath placement Left 11/06/2014    Procedure: INSERTION PORT-A-CATH;  Surgeon: [name], MD;  Location: [facility];  Service: General;  Laterality: Left;    FAMILY HISTORY Family History  Problem Relation Age of Onset  . Coronary artery disease Mother   . Heart disease Mother   . Diabetes Mother   . Hypertension Other   . Hyperlipidemia Other   . Diabetes Other   . Colon cancer Neg Hx   . Alcohol abuse Father   . COPD Maternal Grandmother    the patient's father died at the age of 5 following his seizure. The patient's mother died from congestive 45 failure at the age of 78. The patient had 5 brothers and 2 sisters. One brother died from Escherichia coli infection, the other one had a history of seizure disorder. One sister died from causes unknown to the patient. There is no history of breast or ovarian cancer in the family.  GYNECOLOGIC HISTORY:  Patient's last menstrual period was 11/01/2012. Menarche age 65, first live birth age 88. The patient is GX P2. She stopped having periods in May 2014. She did not take hormone replacement. She did take her control pills remotely for approximately 4 years with no complications.  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 Her daughter [name] [name] lives in [place] and works in Ambulance person for [employer]. Son Deerfield "[name] Kindred" [name] is an Engineer, building services for [employer]: Not in place   HEALTH MAINTENANCE: History  Substance Use Topics  . Smoking status: Never  Smoker   . Smokeless tobacco: Never Used     Comment: Single- brother staying with pt since mom's death  . Alcohol Use: No     Colonoscopy: September 2015/[name]  PAP: February 2015  Bone density:  Lipid panel:  Allergies  Allergen Reactions  . Ace Inhibitors Anaphylaxis    REACTION: Angioedema (tongue swelling)  . Clarithromycin Anaphylaxis    Throat swells  . Levaquin [Levofloxacin] Other (See Comments)    Tendon pain - shoulder and calf    Current Outpatient Prescriptions  Medication Sig Dispense Refill  . dexamethasone (DECADRON) 4 MG tablet Take 2 tablets by mouth once a day on the day after chemotherapy and then take 2 tablets two times a day for 2 days. Take with food. 30 tablet 1  . Glatiramer Acetate (COPAXONE) 40 MG/ML SOSY Inject 40 mg into the skin 3 (three) times a week. 12 Syringe 0  . hydrochlorothiazide (HYDRODIURIL) 25 MG tablet TAKE 1 TABLET DAILY 90 tablet 3  . loratadine-pseudoephedrine (CLARITIN-D 12 HOUR) 5-120 MG per tablet Take 1 tablet by mouth as needed for allergies. 30 tablet 3  . losartan (COZAAR)  50 MG tablet Take 1 tablet (50 mg total) by mouth 2 (two) times daily. 180 tablet 3  . metoprolol (LOPRESSOR) 50 MG tablet TAKE 1 TABLET TWICE A DAY 180 tablet 3  . ondansetron (ZOFRAN) 8 MG tablet Take 1 tablet (8 mg total) by mouth 2 (two) times daily as needed. Start on the third day after chemotherapy. 30 tablet 1  . pantoprazole (PROTONIX) 40 MG tablet Take 1 tablet (40 mg total) by mouth daily. 90 tablet 1  . potassium chloride (MICRO-K) 10 MEQ CR capsule Take 1 capsule (10 mEq total) by mouth 2 (two) times daily. 180 capsule 0  . ALPRAZolam (XANAX) 0.5 MG tablet Take 1 tablet (0.5 mg total) by mouth 3 (three) times daily as needed for anxiety or sleep. (Patient not taking: Reported on 12/24/2014) 30 tablet 1  . amoxicillin-clavulanate (AUGMENTIN) 875-125 MG per tablet Take 1 tablet by mouth 2 (two) times daily. 10 tablet 0  . ciprofloxacin (CIPRO) 500  MG tablet Take 1 tablet (500 mg total) by mouth 2 (two) times daily. (Patient not taking: Reported on 12/31/2014) 10 tablet 0  . lidocaine-prilocaine (EMLA) cream Apply to affected area once (Patient not taking: Reported on 12/24/2014) 30 g 3  . prochlorperazine (COMPAZINE) 10 MG tablet Take 1 tablet (10 mg total) by mouth every 6 (six) hours as needed (Nausea or vomiting). (Patient not taking: Reported on 12/17/2014) 30 tablet 1  . [DISCONTINUED] mometasone (NASONEX) 50 MCG/ACT nasal spray Place 2 sprays into the nose daily. 17 g 2   No current facility-administered medications for this visit.    OBJECTIVE: Middle-aged African-American woman who appears well Filed Vitals:   12/31/14 1121  BP: 125/70  [name]: 123  Temp: 100.3 F (37.9 C)  Resp: 22     Body mass index is 34.38 kg/(m^2).    ECOG FS:0 - Asymptomatic  Skin: warm, dry  HEENT: sclerae anicteric, conjunctivae pink, oropharynx clear. No thrush or mucositis.  Lymph Nodes: No cervical or supraclavicular lymphadenopathy  Lungs: clear to auscultation bilaterally, no rales, wheezes, or rhonci  Heart: tachycardic rate, but regular rhythm. No murmur, gallops, or rubs Abdomen: round, soft, non tender, positive bowel sounds  Musculoskeletal: No focal spinal tenderness, no peripheral edema  Neuro: non focal, well oriented, positive affect  Breasts: deferred  LAB RESULTS:  CMP     Component Value Date/Time   NA 140 12/24/2014 1102   NA 137 01/14/2014 1206   K 3.4* 12/24/2014 1102   K 3.6 01/14/2014 1206   CL 102 01/14/2014 1206   CO2 27 12/24/2014 1102   CO2 27 01/14/2014 1206   GLUCOSE 117 12/24/2014 1102   GLUCOSE 90 01/14/2014 1206   BUN 8.9 12/24/2014 1102   BUN 16 01/14/2014 1206   CREATININE 0.7 12/24/2014 1102   CREATININE 0.9 01/14/2014 1206   CALCIUM 9.0 12/24/2014 1102   CALCIUM 9.5 01/14/2014 1206   PROT 6.3* 12/24/2014 1102   PROT 7.5 01/14/2014 1206   ALBUMIN 3.4* 12/24/2014 1102   ALBUMIN 4.2 01/14/2014 1206     AST 9 12/24/2014 1102   AST 22 01/14/2014 1206   ALT 13 12/24/2014 1102   ALT 20 01/14/2014 1206   ALKPHOS 97 12/24/2014 1102   ALKPHOS 77 01/14/2014 1206   BILITOT 0.55 12/24/2014 1102   BILITOT 0.6 01/14/2014 1206   GFRNONAA 98.72 09/30/2009 1023   GFRAA 87 12/30/2007 0842    INo results found for: SPEP, UPEP  Lab Results  Component Value Date  WBC 11.7* 12/31/2014   NEUTROABS 9.5* 12/31/2014   HGB 10.3* 12/31/2014   HCT 31.1* 12/31/2014   MCV 84.0 12/31/2014   PLT 274 12/31/2014      Chemistry      Component Value Date/Time   NA 140 12/24/2014 1102   NA 137 01/14/2014 1206   K 3.4* 12/24/2014 1102   K 3.6 01/14/2014 1206   CL 102 01/14/2014 1206   CO2 27 12/24/2014 1102   CO2 27 01/14/2014 1206   BUN 8.9 12/24/2014 1102   BUN 16 01/14/2014 1206   CREATININE 0.7 12/24/2014 1102   CREATININE 0.9 01/14/2014 1206      Component Value Date/Time   CALCIUM 9.0 12/24/2014 1102   CALCIUM 9.5 01/14/2014 1206   ALKPHOS 97 12/24/2014 1102   ALKPHOS 77 01/14/2014 1206   AST 9 12/24/2014 1102   AST 22 01/14/2014 1206   ALT 13 12/24/2014 1102   ALT 20 01/14/2014 1206   BILITOT 0.55 12/24/2014 1102   BILITOT 0.6 01/14/2014 1206       No results found for: LABCA2  No components found for: [id number]  No results for input(s): INR in the last 168 hours.  Urinalysis    Component Value Date/Time   COLORURINE YELLOW 01/14/2014 1206   APPEARANCEUR CLEAR 01/14/2014 1206   LABSPEC <=1.005* 01/14/2014 1206   PHURINE 5.5 01/14/2014 1206   GLUCOSEU NEGATIVE 01/14/2014 1206   HGBUR NEGATIVE 01/14/2014 1206   HGBUR negative 08/19/2010 1041   BILIRUBINUR NEGATIVE 01/14/2014 1206   KETONESUR NEGATIVE 01/14/2014 1206   UROBILINOGEN 0.2 01/14/2014 1206   NITRITE NEGATIVE 01/14/2014 1206   LEUKOCYTESUR NEGATIVE 01/14/2014 1206    STUDIES: No results found.  ASSESSMENT: 54 y.o. 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 The labs were reviewed in detail and fortunately, her [place] has rebounded to 9.5. I consulted with Dr. Jana Hakim and he suggested we start augmentin BID x 5 days, and run a culture panel. Accordingly I have written orders for urine and blood cultures, as well as a chest xray. We will hold chemotherapy and she will receive IV fluids instead. [name] [name] will continue tylenol PRN, and will alert of [place] of any worsening condition.   Her potassium is also down to 2.7 today. I am given her 65mq while she is in the treatment room, and she will resume her 212m supplements BID indefinitely.   She will return in 1 week for labs and a follow up visit, and if afebrile at the time she will proceed with her 4th and final cycle of cyclophosphamide and doxorubicin. She understands and agrees with this plan. She knows the goal of treatment in her case is cure. She has been encouraged to call with any issues that might arise before her next visit here.     HeLaurie PandaNP   12/31/2014 11:34 AM

## 2015-01-01 ENCOUNTER — Ambulatory Visit: Payer: BC Managed Care – PPO

## 2015-01-01 LAB — URINE CULTURE

## 2015-01-05 ENCOUNTER — Ambulatory Visit
Admission: RE | Admit: 2015-01-05 | Discharge: 2015-01-05 | Disposition: A | Discharge status: Home / Self Care | Payer: BC Managed Care – PPO | Source: Ambulatory Visit | Attending: Oncology | Admitting: Oncology

## 2015-01-05 DIAGNOSIS — C50411 Malignant neoplasm of upper-outer quadrant of right female breast: Secondary | ICD-10-CM

## 2015-01-05 DIAGNOSIS — I10 Essential (primary) hypertension: Secondary | ICD-10-CM

## 2015-01-05 DIAGNOSIS — G35 Multiple sclerosis: Secondary | ICD-10-CM

## 2015-01-05 MED ORDER — GADOBENATE DIMEGLUMINE 529 MG/ML IV SOLN: 20.0000 mL | Freq: Once | INTRAVENOUS | Status: AC | PRN

## 2015-01-06 ENCOUNTER — Ambulatory Visit (HOSPITAL_BASED_OUTPATIENT_CLINIC_OR_DEPARTMENT_OTHER): Payer: BC Managed Care – PPO

## 2015-01-06 ENCOUNTER — Other Ambulatory Visit (HOSPITAL_BASED_OUTPATIENT_CLINIC_OR_DEPARTMENT_OTHER): Payer: BC Managed Care – PPO

## 2015-01-06 ENCOUNTER — Ambulatory Visit (HOSPITAL_BASED_OUTPATIENT_CLINIC_OR_DEPARTMENT_OTHER): Payer: BC Managed Care – PPO | Admitting: Oncology

## 2015-01-06 VITALS — BP 118/78 | HR 105 | Temp 98.0°F | Resp 18 | Ht 66.0 in | Wt 212.2 lb

## 2015-01-06 DIAGNOSIS — C50411 Malignant neoplasm of upper-outer quadrant of right female breast: Secondary | ICD-10-CM

## 2015-01-06 DIAGNOSIS — Z171 Estrogen receptor negative status [ER-]: Secondary | ICD-10-CM

## 2015-01-06 DIAGNOSIS — C50811 Malignant neoplasm of overlapping sites of right female breast: Secondary | ICD-10-CM

## 2015-01-06 DIAGNOSIS — G35 Multiple sclerosis: Secondary | ICD-10-CM

## 2015-01-06 DIAGNOSIS — Z5111 Encounter for antineoplastic chemotherapy: Secondary | ICD-10-CM | POA: Diagnosis not present

## 2015-01-06 DIAGNOSIS — I10 Essential (primary) hypertension: Secondary | ICD-10-CM

## 2015-01-06 LAB — CBC WITH DIFFERENTIAL/PLATELET
BASO%: 0.9 % (ref 0.0–2.0)
BASOS ABS: 0.1 10*3/uL (ref 0.0–0.1)
EOS%: 4.3 % (ref 0.0–7.0)
Eosinophils Absolute: 0.5 10*3/uL (ref 0.0–0.5)
HCT: 31.7 % — ABNORMAL LOW (ref 34.8–46.6)
HEMOGLOBIN: 10.8 g/dL — AB (ref 11.6–15.9)
LYMPH#: 1.8 10*3/uL (ref 0.9–3.3)
LYMPH%: 14.2 % (ref 14.0–49.7)
MCH: 28.8 pg (ref 25.1–34.0)
MCHC: 34.1 g/dL (ref 31.5–36.0)
MCV: 84.5 fL (ref 79.5–101.0)
MONO#: 1.6 10*3/uL — AB (ref 0.1–0.9)
MONO%: 12.4 % (ref 0.0–14.0)
NEUT#: 8.5 10*3/uL — ABNORMAL HIGH (ref 1.5–6.5)
NEUT%: 68.2 % (ref 38.4–76.8)
NRBC: 2 % — AB (ref 0–0)
Platelets: 455 10*3/uL — ABNORMAL HIGH (ref 145–400)
RBC: 3.75 10*6/uL (ref 3.70–5.45)
RDW: 16.9 % — ABNORMAL HIGH (ref 11.2–14.5)
WBC: 12.5 10*3/uL — AB (ref 3.9–10.3)

## 2015-01-06 LAB — COMPREHENSIVE METABOLIC PANEL (CC13)
ALT: 15 U/L (ref 0–55)
ANION GAP: 16 meq/L — AB (ref 3–11)
AST: 16 U/L (ref 5–34)
Albumin: 3.5 g/dL (ref 3.5–5.0)
Alkaline Phosphatase: 93 U/L (ref 40–150)
BILIRUBIN TOTAL: 0.42 mg/dL (ref 0.20–1.20)
BUN: 8.5 mg/dL (ref 7.0–26.0)
CALCIUM: 9.8 mg/dL (ref 8.4–10.4)
CO2: 20 meq/L — AB (ref 22–29)
Chloride: 101 mEq/L (ref 98–109)
Creatinine: 0.7 mg/dL (ref 0.6–1.1)
GLUCOSE: 107 mg/dL (ref 70–140)
Potassium: 3.8 mEq/L (ref 3.5–5.1)
SODIUM: 137 meq/L (ref 136–145)
TOTAL PROTEIN: 7.3 g/dL (ref 6.4–8.3)

## 2015-01-06 LAB — CULTURE, BLOOD (SINGLE)

## 2015-01-06 MED ORDER — HEPARIN SOD (PORK) LOCK FLUSH 100 UNIT/ML IV SOLN
500.0000 [IU] | Freq: Once | INTRAVENOUS | Status: AC | PRN
  Administered 2015-01-06: 500 [IU]
  Filled 2015-01-06: qty 5

## 2015-01-06 MED ORDER — PALONOSETRON HCL INJECTION 0.25 MG/5ML
INTRAVENOUS | Status: AC
  Filled 2015-01-06: qty 5

## 2015-01-06 MED ORDER — LIDOCAINE-PRILOCAINE 2.5-2.5 % EX CREA
TOPICAL_CREAM | CUTANEOUS | Status: AC
  Filled 2015-01-06: qty 5

## 2015-01-06 MED ORDER — DOXORUBICIN HCL CHEMO IV INJECTION 2 MG/ML
60.0000 mg/m2 | Freq: Once | INTRAVENOUS | Status: AC
  Administered 2015-01-06: 130 mg via INTRAVENOUS
  Filled 2015-01-06: qty 65

## 2015-01-06 MED ORDER — SODIUM CHLORIDE 0.9 % IJ SOLN
10.0000 mL | INTRAMUSCULAR | Status: DC | PRN
  Administered 2015-01-06: 10 mL
  Filled 2015-01-06: qty 10

## 2015-01-06 MED ORDER — SODIUM CHLORIDE 0.9 % IV SOLN
600.0000 mg/m2 | Freq: Once | INTRAVENOUS | Status: AC
  Administered 2015-01-06: 1300 mg via INTRAVENOUS
  Filled 2015-01-06: qty 65

## 2015-01-06 MED ORDER — SODIUM CHLORIDE 0.9 % IV SOLN
Freq: Once | INTRAVENOUS | Status: AC
  Administered 2015-01-06: 14:00:00 via INTRAVENOUS

## 2015-01-06 MED ORDER — SODIUM CHLORIDE 0.9 % IV SOLN
Freq: Once | INTRAVENOUS | Status: AC
  Administered 2015-01-06: 14:00:00 via INTRAVENOUS
  Filled 2015-01-06: qty 5

## 2015-01-06 MED ORDER — PALONOSETRON HCL INJECTION 0.25 MG/5ML
0.2500 mg | Freq: Once | INTRAVENOUS | Status: AC
  Administered 2015-01-06: 0.25 mg via INTRAVENOUS

## 2015-01-06 NOTE — Patient Instructions (Signed)
[facility] Discharge Instructions for Patients Receiving Chemotherapy  Today you received the following chemotherapy agents adriamycin/cytoxan  To help prevent nausea and vomiting after your treatment, we encourage you to take your nausea medication as directed  If you develop nausea and vomiting that is not controlled by your nausea medication, call the clinic.   BELOW ARE SYMPTOMS THAT SHOULD BE REPORTED IMMEDIATELY:  *FEVER GREATER THAN 100.5 F  *CHILLS WITH OR WITHOUT FEVER  NAUSEA AND VOMITING THAT IS NOT CONTROLLED WITH YOUR NAUSEA MEDICATION  *UNUSUAL SHORTNESS OF BREATH  *UNUSUAL BRUISING OR BLEEDING  TENDERNESS IN MOUTH AND THROAT WITH OR WITHOUT PRESENCE OF ULCERS  *URINARY PROBLEMS  *BOWEL PROBLEMS  UNUSUAL RASH Items with * indicate a potential emergency and should be followed up as soon as possible.  Feel free to call the clinic you have any questions or concerns. The clinic phone number is [phone].  

## 2015-01-06 NOTE — Progress Notes (Signed)
[facility]  [phone]) [phone] [phone]) [phone]     ID: [name] [name] DOB: 1961/09/03  MR#: [id number]  QBH#:[id number]  Patient Care Team: [name], MD as PCP - General [name], MD as Consulting Physician (Obstetrics and Gynecology) [name], MD (Gastroenterology) [name]. [name], MD (Neurology) [name], MD as Consulting Physician (General Surgery) [name], MD as Consulting Physician (Oncology) [name], MD as Attending Physician (Radiation Oncology) [name], RN as Registered Nurse [name], RN as Registered Nurse OTHER MD:  CHIEF COMPLAINT: Triple negative breast cancer  CURRENT TREATMENT: Neoadjuvant chemotherapy  BREAST CANCER HISTORY: From the original intake note:  [name] [name] had bilateral screening mammography at the [facility] 11/21/2013. Breast density was category C. Exam was unremarkable. In January 2016 however the patient palpated a change in her right breast laterally. She contacted her gynecologist, Dr. [name], and he set her up for a unilateral right diagnostic mammography with right breast ultrasonography at [facility] 10/15/2014. There was a focal irregular opacity in the lateral right breast associated with pleomorphic calcifications. This was palpable on exam as a firm non-mobile mass. Ultrasound confirmed a hypoechoic mass in the right breast at the 9:00 position 5 cm from the nipple, measuring 2.9 cm. Next to this mass was a normal-sized intramammary lymph node measuring 4 mm. In the right axilla there were 3 contiguous level I lymph nodes the largest measuring 11 mm.  On 10/20/2014 the patient underwent right core needle biopsy of the 9:00 breast mass, showing ([id number]) and invasive ductal carcinoma, grade 3, triple negative, with the HER-2/neu signals ratio of 1.26 and number per cell 2.15. The MIB-1 was 71%. Biopsy of the largest right axillary lymph node was negative. Dr.  Owens Shark the mammographer who performed a biopsy describes this is concordant.  Left breast mammography 10/26/2014 was negative, and bilateral breast MRI the same day confirmed, in the right breast, and upper outer quadrant mass measuring 2.5 cm, with a second mass measuring 1.4 cm slightly anterior and superior to it. In addition there was a total area of approximately 6.5 cm of non-masslike enhancement extending throughout the upper outer quadrant of the right breast. There was a cortically thickened right axillary lymph node which had been biopsied. There was also a mildly cortically thickened right subpectoral lymph node. There were no internal mammary or left axillary lymph nodes in the left breast was unremarkable.  The patient's subsequent history is as detailed below.  INTERVAL HISTORY: [name] [name] returns today for follow up of her breast cancer. Today is day 1, cycle 4 of cyclophosphamide and doxorubicin, with neulasta given on day 2 for granulocyte support. Her treatment was delayed a week so she ended up having her repeat breast MRI on before her fourth cycle instead of after. Nevertheless it shows significant shrinkage, by about one third, which is very favorable.  REVIEW OF SYSTEMS: [name] [name] is doing "good". She is status around the house because she is afraid of crowds and catching something if she gets exposed to 2 many people. She is not exercising regularly. She has had no problems with nausea or vomiting, mouth sores, cough, phlegm production, pleurisy, or change in bowel or bladder habits. A detailed review of systems today was otherwise stable.  PAST MEDICAL HISTORY: Past Medical History  Diagnosis Date  . METABOLIC SYNDROME X   . OBESITY, TRUNCAL   . CONSTIPATION, CHRONIC   . KELOID   . Multiple sclerosis   .  Allergic rhinitis due to other allergen   . HYPERTENSION   . HYPERLIPIDEMIA   . GERD   . Angioedema     ACEI  . Colon polyps 05/2014    adenomatous, colo q 5y  . Migraine  headache   . Allergy   . Anxiety   . Breast cancer of upper-outer quadrant of right female breast 10/23/2014  . Hot flashes     PAST SURGICAL HISTORY: Past Surgical History  Procedure Laterality Date  . Tubal ligation    . Colonoscopy    . Portacath placement Left 11/06/2014    Procedure: INSERTION PORT-A-CATH;  Surgeon: [name], MD;  Location: [facility];  Service: General;  Laterality: Left;    FAMILY HISTORY Family History  Problem Relation Age of Onset  . Coronary artery disease Mother   . Heart disease Mother   . Diabetes Mother   . Hypertension Other   . Hyperlipidemia Other   . Diabetes Other   . Colon cancer Neg Hx   . Alcohol abuse Father   . COPD Maternal Grandmother    the patient's father died at the age of 23 following his seizure. The patient's mother died from congestive 50 failure at the age of 26. The patient had 5 brothers and 2 sisters. One brother died from Escherichia coli infection, the other one had a history of seizure disorder. One sister died from causes unknown to the patient. There is no history of breast or ovarian cancer in the family.  GYNECOLOGIC HISTORY:  Patient's last menstrual period was 11/01/2012. Menarche age 72, first live birth age 40. The patient is GX P2. She stopped having periods in May 2014. She did not take hormone replacement. She did take her control pills remotely for approximately 4 years with no complications.  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 Her daughter [name] [name] lives in [place] and works in Ambulance person for [employer]. Son [name] [name] is an Engineer, building services for [employer]: Not in place   HEALTH MAINTENANCE: History  Substance Use Topics  . Smoking status: Never Smoker   . Smokeless tobacco: Never Used     Comment: Single-  brother staying with pt since mom's death  . Alcohol Use: No     Colonoscopy: September 2015/[name] [name]  PAP: February 2015  Bone density:  Lipid panel:  Allergies  Allergen Reactions  . Ace Inhibitors Anaphylaxis    REACTION: Angioedema (tongue swelling)  . Clarithromycin Anaphylaxis    Throat swells  . Levaquin [Levofloxacin] Other (See Comments)    Tendon pain - shoulder and calf    Current Outpatient Prescriptions  Medication Sig Dispense Refill  . ALPRAZolam (XANAX) 0.5 MG tablet Take 1 tablet (0.5 mg total) by mouth 3 (three) times daily as needed for anxiety or sleep. (Patient not taking: Reported on 12/24/2014) 30 tablet 1  . amoxicillin-clavulanate (AUGMENTIN) 875-125 MG per tablet Take 1 tablet by mouth 2 (two) times daily. 10 tablet 0  . ciprofloxacin (CIPRO) 500 MG tablet Take 1 tablet (500 mg total) by mouth 2 (two) times daily. (Patient not taking: Reported on 12/31/2014) 10 tablet 0  . dexamethasone (DECADRON) 4 MG tablet Take 2 tablets by mouth once a day on the day after chemotherapy and then take 2 tablets two times a day for 2 days. Take with food. 30 tablet  1  . Glatiramer Acetate (COPAXONE) 40 MG/ML SOSY Inject 40 mg into the skin 3 (three) times a week. 12 Syringe 0  . hydrochlorothiazide (HYDRODIURIL) 25 MG tablet TAKE 1 TABLET DAILY 90 tablet 3  . lidocaine-prilocaine (EMLA) cream Apply to affected area once (Patient not taking: Reported on 12/24/2014) 30 g 3  . loratadine-pseudoephedrine (CLARITIN-D 12 HOUR) 5-120 MG per tablet Take 1 tablet by mouth as needed for allergies. 30 tablet 3  . losartan (COZAAR) 50 MG tablet Take 1 tablet (50 mg total) by mouth 2 (two) times daily. 180 tablet 3  . metoprolol (LOPRESSOR) 50 MG tablet TAKE 1 TABLET TWICE A DAY 180 tablet 3  . ondansetron (ZOFRAN) 8 MG tablet Take 1 tablet (8 mg total) by mouth 2 (two) times daily as needed. Start on the third day after chemotherapy. 30 tablet 1  . pantoprazole (PROTONIX) 40 MG tablet Take  1 tablet (40 mg total) by mouth daily. 90 tablet 1  . potassium chloride (MICRO-K) 10 MEQ CR capsule Take 1 capsule (10 mEq total) by mouth 2 (two) times daily. 180 capsule 0  . prochlorperazine (COMPAZINE) 10 MG tablet Take 1 tablet (10 mg total) by mouth every 6 (six) hours as needed (Nausea or vomiting). (Patient not taking: Reported on 12/17/2014) 30 tablet 1  . [DISCONTINUED] mometasone (NASONEX) 50 MCG/ACT nasal spray Place 2 sprays into the nose daily. 17 g 2   No current facility-administered medications for this visit.    OBJECTIVE: Middle-aged African-American woman in no acute distress Filed Vitals:   01/06/15 1229  BP: 118/78  [name]: 105  Temp: 98 F (36.7 C)  Resp: 18     Body mass index is 34.27 kg/(m^2).    ECOG FS:0 - Asymptomatic  Sclerae unicteric, pupils round and equal Oropharynx clear and moist-- no thrush or other lesions No cervical or supraclavicular adenopathy Lungs no rales or rhonchi Heart regular rate and rhythm Abd soft, nontender, positive bowel sounds MSK no focal spinal tenderness, no upper extremity lymphedema Neuro: nonfocal, well oriented, appropriate affect Breasts: The mass in the upper outer quadrant of the right breast around 9:00 measures approximately 1 cm. It is nontender and easily movable. There are no other findings in the right breast or right axilla. The left breast is unremarkable    LAB RESULTS:  CMP     Component Value Date/Time   NA 137 01/06/2015 1150   NA 133* 12/31/2014 1102   K 3.8 01/06/2015 1150   K 2.7* 12/31/2014 1102   CL 97 12/31/2014 1102   CO2 20* 01/06/2015 1150   CO2 26 12/31/2014 1102   GLUCOSE 107 01/06/2015 1150   GLUCOSE 129* 12/31/2014 1102   BUN 8.5 01/06/2015 1150   BUN 9 12/31/2014 1102   CREATININE 0.7 01/06/2015 1150   CREATININE 0.82 12/31/2014 1102   CALCIUM 9.8 01/06/2015 1150   CALCIUM 9.0 12/31/2014 1102   PROT 7.3 01/06/2015 1150   PROT 7.3 12/31/2014 1102   ALBUMIN 3.5 01/06/2015 1150     ALBUMIN 4.0 12/31/2014 1102   AST 16 01/06/2015 1150   AST 17 12/31/2014 1102   ALT 15 01/06/2015 1150   ALT 13 12/31/2014 1102   ALKPHOS 93 01/06/2015 1150   ALKPHOS 101 12/31/2014 1102   BILITOT 0.42 01/06/2015 1150   BILITOT 0.4 12/31/2014 1102   GFRNONAA 81* 12/31/2014 1102   GFRAA >90 12/31/2014 1102    INo results found for: SPEP, UPEP  Lab Results  Component Value Date  WBC 12.5* 01/06/2015   NEUTROABS 8.5* 01/06/2015   HGB 10.8* 01/06/2015   HCT 31.7* 01/06/2015   MCV 84.5 01/06/2015   PLT 455* 01/06/2015      Chemistry      Component Value Date/Time   NA 137 01/06/2015 1150   NA 133* 12/31/2014 1102   K 3.8 01/06/2015 1150   K 2.7* 12/31/2014 1102   CL 97 12/31/2014 1102   CO2 20* 01/06/2015 1150   CO2 26 12/31/2014 1102   BUN 8.5 01/06/2015 1150   BUN 9 12/31/2014 1102   CREATININE 0.7 01/06/2015 1150   CREATININE 0.82 12/31/2014 1102      Component Value Date/Time   CALCIUM 9.8 01/06/2015 1150   CALCIUM 9.0 12/31/2014 1102   ALKPHOS 93 01/06/2015 1150   ALKPHOS 101 12/31/2014 1102   AST 16 01/06/2015 1150   AST 17 12/31/2014 1102   ALT 15 01/06/2015 1150   ALT 13 12/31/2014 1102   BILITOT 0.42 01/06/2015 1150   BILITOT 0.4 12/31/2014 1102       No results found for: LABCA2  No components found for: TDDUK025  No results for input(s): INR in the last 168 hours.  Urinalysis    Component Value Date/Time   COLORURINE YELLOW 01/14/2014 1206   APPEARANCEUR CLEAR 01/14/2014 1206   LABSPEC <=1.005* 01/14/2014 1206   PHURINE 5.5 01/14/2014 1206   GLUCOSEU NEGATIVE 01/14/2014 1206   HGBUR NEGATIVE 01/14/2014 1206   HGBUR negative 08/19/2010 1041   BILIRUBINUR NEGATIVE 01/14/2014 1206   KETONESUR NEGATIVE 01/14/2014 1206   UROBILINOGEN 0.2 01/14/2014 1206   NITRITE NEGATIVE 01/14/2014 1206   LEUKOCYTESUR NEGATIVE 01/14/2014 1206    STUDIES: Dg Chest 2 View  12/31/2014   CLINICAL DATA:  Recent diagnosis of breast carcinoma. Fever of  unknown origin.  EXAM: CHEST  2 VIEW  COMPARISON:  November 06, 2014  FINDINGS: Port-A-Cath tip is in the superior vena cava. No pneumothorax. Lungs are clear. Heart size and pulmonary vascularity are normal. No adenopathy. No bone lesions.  IMPRESSION: No edema or consolidation.  No mass or adenopathy appreciable.   Electronically Signed   By: Lowella Grip III M.D.   On: 12/31/2014 17:00   Mr Breast Bilateral W Wo Contrast  01/05/2015   CLINICAL DATA:  54 year old female with grade 3, triple negative, right breast invasive ductal carcinoma diagnosed in January, 2016. Evaluate response to neoadjuvant therapy.  LABS:  Not applicable.  EXAM: BILATERAL BREAST MRI WITH AND WITHOUT CONTRAST  TECHNIQUE: Multiplanar, multisequence MR images of both breasts were obtained prior to and following the intravenous administration of 20 ml of MultiHance  THREE-DIMENSIONAL MR IMAGE RENDERING ON INDEPENDENT WORKSTATION:  Three-dimensional MR images were rendered by post-processing of the original MR data on an independent workstation. The three-dimensional MR images were interpreted, and findings are reported in the following complete MRI report for this study. Three dimensional images were evaluated at the independent DynaCad workstation  COMPARISON:  10/26/2014 breast MRI.  FINDINGS: Breast composition: c.  Heterogeneous fibroglandular tissue.  Background parenchymal enhancement: Moderate.  Right breast: The irregular enhancing mass with central clip artifact located within the upper outer quadrant of the right breast corresponding to the known invasive ductal carcinoma has mildly decreased in size and now measures 1.7 x 1.5 x 1.5 cm. This previously measured 2.5 x 2.2 x 2.1 cm in size. The smaller mass previously seen located 5 mm anterior superior the mass measuring 1.4 cm in size is not visible today. In addition,  the previously seen 6.5 cm area of non mass enhancement seen within the upper-outer quadrant of the right  breast is no longer present today. There are no additional findings.  Left breast: No mass or abnormal enhancement.  Lymph nodes: The previously seen mildly prominent, cortically thickened right axillary lymph nodes appear less prominent.  Ancillary findings:  None.  IMPRESSION: 1. Decrease in size of the previously biopsied mass located within the upper outer quadrant of the right breast. This now measures 1.7 cm in size and previously measured 2.5 cm in size. 2. Resolution of the smaller more anteriorly and superiorly located enhancing mass and resolution of the previously seen non mass enhancement within the upper-outer quadrant of the right breast. 3. The previously prominent right axillary lymph nodes appear less prominent on today's study.  RECOMMENDATION: Treatment plan.  BI-RADS CATEGORY  6: Known biopsy-proven malignancy.   Electronically Signed   By: Altamese Cabal M.D.   On: 01/05/2015 10:51    ASSESSMENT: 54 y.o. 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   2) neoadjuvant chemotherapy to consist of cyclophosphamide and doxorubicin in dose dense fashion 4 given with Neulasta support, completed 01/06/2015, followed by weekly carboplatin and paclitaxel 12  (3) definitive surgery to follow chemotherapy  (4) adjuvant radiation, if appropriate, to follow surgery  PLAN: [name] [name] is completing the first part of her chemotherapy today and she has already had the tumor shrinkage by about one  third. In 2 weeks she will start her weekly paclitaxel /carboplatin. Today we discussed the possible toxicities, side effects and complications of those agents. The good news for her is that she will not need Neulasta, which is what bothers her the most.   When she completes chemotherapy she will have one final MRI of the breast and then proceed directly to surgery.  She has a good understanding of this plan. She agrees with it. She knows to call for any problems that may develop before her next visit here.    [name], MD   01/06/2015 12:40 PM

## 2015-01-07 ENCOUNTER — Ambulatory Visit: Payer: BC Managed Care – PPO

## 2015-01-08 ENCOUNTER — Ambulatory Visit (HOSPITAL_BASED_OUTPATIENT_CLINIC_OR_DEPARTMENT_OTHER): Payer: BC Managed Care – PPO

## 2015-01-08 VITALS — BP 128/75 | HR 84 | Temp 98.1°F | Resp 18

## 2015-01-08 DIAGNOSIS — C50811 Malignant neoplasm of overlapping sites of right female breast: Secondary | ICD-10-CM | POA: Diagnosis not present

## 2015-01-08 DIAGNOSIS — C50411 Malignant neoplasm of upper-outer quadrant of right female breast: Secondary | ICD-10-CM

## 2015-01-08 DIAGNOSIS — Z5189 Encounter for other specified aftercare: Secondary | ICD-10-CM

## 2015-01-08 MED ORDER — PEGFILGRASTIM INJECTION 6 MG/0.6ML ~~LOC~~
6.0000 mg | PREFILLED_SYRINGE | Freq: Once | SUBCUTANEOUS | Status: AC
  Administered 2015-01-08: 6 mg via SUBCUTANEOUS
  Filled 2015-01-08: qty 0.6

## 2015-01-08 NOTE — Patient Instructions (Signed)
Pegfilgrastim injection What is this medicine? PEGFILGRASTIM (peg fil GRA stim) is a long-acting granulocyte colony-stimulating factor that stimulates the growth of neutrophils, a type of white blood cell important in the body's fight against infection. It is used to reduce the incidence of fever and infection in patients with certain types of cancer who are receiving chemotherapy that affects the bone marrow. This medicine may be used for other purposes; ask your health care provider or pharmacist if you have questions. COMMON BRAND NAME(S): Neulasta What should I tell my health care provider before I take this medicine? They need to know if you have any of these conditions: -latex allergy -ongoing radiation therapy -sickle cell disease -skin reactions to acrylic adhesives (On-Body Injector only) -an unusual or allergic reaction to pegfilgrastim, filgrastim, other medicines, foods, dyes, or preservatives -pregnant or trying to get pregnant -breast-feeding How should I use this medicine? This medicine is for injection under the skin. If you get this medicine at home, you will be taught how to prepare and give the pre-filled syringe or how to use the On-body Injector. Refer to the patient Instructions for Use for detailed instructions. Use exactly as directed. Take your medicine at regular intervals. Do not take your medicine more often than directed. It is important that you put your used needles and syringes in a special sharps container. Do not put them in a trash can. If you do not have a sharps container, call your pharmacist or healthcare provider to get one. Talk to your pediatrician regarding the use of this medicine in children. Special care may be needed. Overdosage: If you think you have taken too much of this medicine contact a poison control center or emergency room at once. NOTE: This medicine is only for you. Do not share this medicine with others. What if I miss a dose? It is  important not to miss your dose. Call your doctor or health care professional if you miss your dose. If you miss a dose due to an On-body Injector failure or leakage, a new dose should be administered as soon as possible using a single prefilled syringe for manual use. What may interact with this medicine? Interactions have not been studied. Give your health care provider a list of all the medicines, herbs, non-prescription drugs, or dietary supplements you use. Also tell them if you smoke, drink alcohol, or use illegal drugs. Some items may interact with your medicine. This list may not describe all possible interactions. Give your health care provider a list of all the medicines, herbs, non-prescription drugs, or dietary supplements you use. Also tell them if you smoke, drink alcohol, or use illegal drugs. Some items may interact with your medicine. What should I watch for while using this medicine? You may need blood work done while you are taking this medicine. If you are going to need a MRI, CT scan, or other procedure, tell your doctor that you are using this medicine (On-Body Injector only). What side effects may I notice from receiving this medicine? Side effects that you should report to your doctor or health care professional as soon as possible: -allergic reactions like skin rash, itching or hives, swelling of the face, lips, or tongue -dizziness -fever -pain, redness, or irritation at site where injected -pinpoint red spots on the skin -shortness of breath or breathing problems -stomach or side pain, or pain at the shoulder -swelling -tiredness -trouble passing urine Side effects that usually do not require medical attention (report to your doctor   or health care professional if they continue or are bothersome): -bone pain -muscle pain This list may not describe all possible side effects. Call your doctor for medical advice about side effects. You may report side effects to FDA at  1-800-FDA-1088. Where should I keep my medicine? Keep out of the reach of children. Store pre-filled syringes in a refrigerator between 2 and 8 degrees C (36 and 46 degrees F). Do not freeze. Keep in carton to protect from light. Throw away this medicine if it is left out of the refrigerator for more than 48 hours. Throw away any unused medicine after the expiration date. NOTE: This sheet is a summary. It may not cover all possible information. If you have questions about this medicine, talk to your doctor, pharmacist, or health care provider.  2015, [employer]/Gold Standard. (2013-12-04 16:14:05)  

## 2015-01-14 ENCOUNTER — Ambulatory Visit (HOSPITAL_BASED_OUTPATIENT_CLINIC_OR_DEPARTMENT_OTHER): Payer: BC Managed Care – PPO | Admitting: Nurse Practitioner

## 2015-01-14 ENCOUNTER — Ambulatory Visit: Payer: BC Managed Care – PPO

## 2015-01-14 ENCOUNTER — Ambulatory Visit (HOSPITAL_BASED_OUTPATIENT_CLINIC_OR_DEPARTMENT_OTHER): Payer: BC Managed Care – PPO

## 2015-01-14 ENCOUNTER — Encounter: Payer: Self-pay | Admitting: Nurse Practitioner

## 2015-01-14 VITALS — BP 126/58 | HR 101 | Temp 98.7°F | Resp 18 | Ht 66.0 in | Wt 212.7 lb

## 2015-01-14 DIAGNOSIS — C50811 Malignant neoplasm of overlapping sites of right female breast: Secondary | ICD-10-CM

## 2015-01-14 DIAGNOSIS — C50411 Malignant neoplasm of upper-outer quadrant of right female breast: Secondary | ICD-10-CM

## 2015-01-14 DIAGNOSIS — I10 Essential (primary) hypertension: Secondary | ICD-10-CM

## 2015-01-14 DIAGNOSIS — G35 Multiple sclerosis: Secondary | ICD-10-CM

## 2015-01-14 LAB — COMPREHENSIVE METABOLIC PANEL (CC13)
ALBUMIN: 3.4 g/dL — AB (ref 3.5–5.0)
ALT: 13 U/L (ref 0–55)
ANION GAP: 12 meq/L — AB (ref 3–11)
AST: 9 U/L (ref 5–34)
Alkaline Phosphatase: 74 U/L (ref 40–150)
BILIRUBIN TOTAL: 0.31 mg/dL (ref 0.20–1.20)
BUN: 10.8 mg/dL (ref 7.0–26.0)
CALCIUM: 9 mg/dL (ref 8.4–10.4)
CHLORIDE: 101 meq/L (ref 98–109)
CO2: 25 meq/L (ref 22–29)
Creatinine: 0.7 mg/dL (ref 0.6–1.1)
EGFR: >90 mL/min/{1.73_m2} (ref >90)
GLUCOSE: 135 mg/dL (ref 70–140)
POTASSIUM: 3.4 meq/L — AB (ref 3.5–5.1)
SODIUM: 138 meq/L (ref 136–145)
TOTAL PROTEIN: 6.1 g/dL — AB (ref 6.4–8.3)

## 2015-01-14 LAB — CBC WITH DIFFERENTIAL/PLATELET
BASO%: 0.8 % (ref 0.0–2.0)
Basophils Absolute: 0 10*3/uL (ref 0.0–0.1)
EOS%: 3.2 % (ref 0.0–7.0)
Eosinophils Absolute: 0 10*3/uL (ref 0.0–0.5)
HEMATOCRIT: 29.7 % — AB (ref 34.8–46.6)
HGB: 10 g/dL — ABNORMAL LOW (ref 11.6–15.9)
LYMPH%: 25 % (ref 14.0–49.7)
MCH: 28.2 pg (ref 25.1–34.0)
MCHC: 33.7 g/dL (ref 31.5–36.0)
MCV: 83.7 fL (ref 79.5–101.0)
MONO#: 0.2 10*3/uL (ref 0.1–0.9)
MONO%: 13.7 % (ref 0.0–14.0)
NEUT#: 0.7 10*3/uL — ABNORMAL LOW (ref 1.5–6.5)
NEUT%: 57.3 % (ref 38.4–76.8)
PLATELETS: 118 10*3/uL — AB (ref 145–400)
RBC: 3.55 10*6/uL — AB (ref 3.70–5.45)
RDW: 15.7 % — ABNORMAL HIGH (ref 11.2–14.5)
WBC: 1.2 10*3/uL — AB (ref 3.9–10.3)
lymph#: 0.3 10*3/uL — ABNORMAL LOW (ref 0.9–3.3)

## 2015-01-14 NOTE — Progress Notes (Signed)
[facility]  [phone] [phone]     ID: [name] [name] DOB: [birth year]  MR#: [id number]  GEX#:[id number]  Patient Care Team: [name], MD as PCP - General [name], MD as Consulting Physician (Obstetrics and Gynecology) [name], MD (Gastroenterology) [name]. [name], MD (Neurology) [name], MD as Consulting Physician (General Surgery) [name], MD as Consulting Physician (Oncology) [name], MD as Attending Physician (Radiation Oncology) [name], RN as Registered Nurse [name], RN as Registered Nurse OTHER MD:  CHIEF COMPLAINT: Triple negative breast cancer  CURRENT TREATMENT: Neoadjuvant chemotherapy  BREAST CANCER HISTORY: From the original intake note:  [name] [name] had bilateral screening mammography at the [facility] 11/21/2013. Breast density was category C. Exam was unremarkable. In January 2016 however the patient palpated a change in her right breast laterally. She contacted her gynecologist, Dr. [name], and he set her up for a unilateral right diagnostic mammography with right breast ultrasonography at [facility] 10/15/2014. There was a focal irregular opacity in the lateral right breast associated with pleomorphic calcifications. This was palpable on exam as a firm non-mobile mass. Ultrasound confirmed a hypoechoic mass in the right breast at the 9:00 position 5 cm from the nipple, measuring 2.9 cm. Next to this mass was a normal-sized intramammary lymph node measuring 4 mm. In the right axilla there were 3 contiguous level I lymph nodes the largest measuring 11 mm.  On 10/20/2014 the patient underwent right core needle biopsy of the 9:00 breast mass, showing ([id number]) and invasive ductal carcinoma, grade 3, triple negative, with the HER-2/neu signals ratio of 1.26 and number per cell 2.15. The MIB-1 was 71%. Biopsy of the largest right axillary lymph node was negative. Dr.  Owens Shark the mammographer who performed a biopsy describes this is concordant.  Left breast mammography 10/26/2014 was negative, and bilateral breast MRI the same day confirmed, in the right breast, and upper outer quadrant mass measuring 2.5 cm, with a second mass measuring 1.4 cm slightly anterior and superior to it. In addition there was a total area of approximately 6.5 cm of non-masslike enhancement extending throughout the upper outer quadrant of the right breast. There was a cortically thickened right axillary lymph node which had been biopsied. There was also a mildly cortically thickened right subpectoral lymph node. There were no internal mammary or left axillary lymph nodes in the left breast was unremarkable.  The patient's subsequent history is as detailed below.  INTERVAL HISTORY: [name] [name] returns today for follow up of her breast cancer. Today is day 8, cycle 4 of cyclophosphamide and doxorubicin, with neulasta given on day 2 for granulocyte support.   REVIEW OF SYSTEMS: [name] [name] denies fevers, chills, nausea, vomiting, or changes in bowel or bladder habits. She is eating and drinking well. She has no shortness of breath, chest pain, cough, or palpitations. She has good energy, and sleeps well at night. A detailed review of systems is otherwise stable.   PAST MEDICAL HISTORY: Past Medical History  Diagnosis Date  . METABOLIC SYNDROME X   . OBESITY, TRUNCAL   . CONSTIPATION, CHRONIC   . KELOID   . Multiple sclerosis   . Allergic rhinitis due to other allergen   . HYPERTENSION   . HYPERLIPIDEMIA   . GERD   . Angioedema     ACEI  . Colon polyps 05/2014    adenomatous, colo q 5y  . Migraine headache   . Allergy   .  Anxiety   . Breast cancer of upper-outer quadrant of right female breast 10/23/2014  . Hot flashes     PAST SURGICAL HISTORY: Past Surgical History  Procedure Laterality Date  . Tubal ligation    . Colonoscopy    . Portacath placement Left 11/06/2014    Procedure:  INSERTION PORT-A-CATH;  Surgeon: [name], MD;  Location: [facility];  Service: General;  Laterality: Left;    FAMILY HISTORY Family History  Problem Relation Age of Onset  . Coronary artery disease Mother   . Heart disease Mother   . Diabetes Mother   . Hypertension Other   . Hyperlipidemia Other   . Diabetes Other   . Colon cancer Neg Hx   . Alcohol abuse Father   . COPD Maternal Grandmother    the patient's father died at the age of 73 following his seizure. The patient's mother died from congestive 37 failure at the age of 71. The patient had 5 brothers and 2 sisters. One brother died from Escherichia coli infection, the other one had a history of seizure disorder. One sister died from causes unknown to the patient. There is no history of breast or ovarian cancer in the family.  GYNECOLOGIC HISTORY:  Patient's last menstrual period was 11/01/2012. Menarche age 44, first live birth age 52. The patient is GX P2. She stopped having periods in May 2014. She did not take hormone replacement. She did take her control pills remotely for approximately 4 years with no complications.  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 Her daughter [name] [name] lives in [place] and works in Ambulance person for [employer]. Son [name] [name] is an Engineer, building services for [employer]: Not in place   HEALTH MAINTENANCE: History  Substance Use Topics  . Smoking status: Never Smoker   . Smokeless tobacco: Never Used     Comment: Single- brother staying with pt since mom's death  . Alcohol Use: No     Colonoscopy: September 2015/[name]  PAP: February 2015  Bone density:  Lipid panel:  Allergies  Allergen Reactions  . Ace Inhibitors Anaphylaxis    REACTION: Angioedema (tongue swelling)  . Clarithromycin Anaphylaxis     Throat swells  . Levaquin [Levofloxacin] Other (See Comments)    Tendon pain - shoulder and calf    Current Outpatient Prescriptions  Medication Sig Dispense Refill  . Glatiramer Acetate (COPAXONE) 40 MG/ML SOSY Inject 40 mg into the skin 3 (three) times a week. 12 Syringe 0  . hydrochlorothiazide (HYDRODIURIL) 25 MG tablet TAKE 1 TABLET DAILY 90 tablet 3  . loratadine-pseudoephedrine (CLARITIN-D 12 HOUR) 5-120 MG per tablet Take 1 tablet by mouth as needed for allergies. 30 tablet 3  . losartan (COZAAR) 50 MG tablet Take 1 tablet (50 mg total) by mouth 2 (two) times daily. 180 tablet 3  . metoprolol (LOPRESSOR) 50 MG tablet TAKE 1 TABLET TWICE A DAY 180 tablet 3  . pantoprazole (PROTONIX) 40 MG tablet Take 1 tablet (40 mg total) by mouth daily. 90 tablet 1  . potassium chloride (MICRO-K) 10 MEQ CR capsule Take 1 capsule (10 mEq total) by mouth 2 (two) times daily. (Patient taking differently: Take 10 mEq by mouth 2 (two) times daily. Pt taking 2 - 10 mEq tablets 2 times a day. Total dose 40 mEq daily.) 180 capsule 0  .  ALPRAZolam (XANAX) 0.5 MG tablet Take 1 tablet (0.5 mg total) by mouth 3 (three) times daily as needed for anxiety or sleep. (Patient not taking: Reported on 12/24/2014) 30 tablet 1  . amoxicillin-clavulanate (AUGMENTIN) 875-125 MG per tablet Take 1 tablet by mouth 2 (two) times daily. (Patient not taking: Reported on 01/14/2015) 10 tablet 0  . ciprofloxacin (CIPRO) 500 MG tablet Take 1 tablet (500 mg total) by mouth 2 (two) times daily. (Patient not taking: Reported on 12/31/2014) 10 tablet 0  . dexamethasone (DECADRON) 4 MG tablet Take 2 tablets by mouth once a day on the day after chemotherapy and then take 2 tablets two times a day for 2 days. Take with food. (Patient not taking: Reported on 01/14/2015) 30 tablet 1  . lidocaine-prilocaine (EMLA) cream Apply to affected area once (Patient not taking: Reported on 01/14/2015) 30 g 3  . ondansetron (ZOFRAN) 8 MG tablet Take 1 tablet (8  mg total) by mouth 2 (two) times daily as needed. Start on the third day after chemotherapy. (Patient not taking: Reported on 01/14/2015) 30 tablet 1  . prochlorperazine (COMPAZINE) 10 MG tablet Take 1 tablet (10 mg total) by mouth every 6 (six) hours as needed (Nausea or vomiting). (Patient not taking: Reported on 12/17/2014) 30 tablet 1  . [DISCONTINUED] mometasone (NASONEX) 50 MCG/ACT nasal spray Place 2 sprays into the nose daily. 17 g 2   No current facility-administered medications for this visit.    OBJECTIVE: Middle-aged African-American woman in no acute distress Filed Vitals:   01/14/15 0837  BP: 126/58  [name]: 101  Temp: 98.7 F (37.1 C)  Resp: 18     Body mass index is 34.35 kg/(m^2).    ECOG FS:0 - Asymptomatic  Skin: warm, dry  HEENT: sclerae anicteric, conjunctivae pink, oropharynx clear. No thrush or mucositis.  Lymph Nodes: No cervical or supraclavicular lymphadenopathy  Lungs: clear to auscultation bilaterally, no rales, wheezes, or rhonci  Heart: regular rate and rhythm  Abdomen: round, soft, non tender, positive bowel sounds  Musculoskeletal: No focal spinal tenderness, no peripheral edema  Neuro: non focal, well oriented, positive affect  Breasts: deferred   LAB RESULTS:  CMP     Component Value Date/Time   NA 137 01/06/2015 1150   NA 133* 12/31/2014 1102   K 3.8 01/06/2015 1150   K 2.7* 12/31/2014 1102   CL 97 12/31/2014 1102   CO2 20* 01/06/2015 1150   CO2 26 12/31/2014 1102   GLUCOSE 107 01/06/2015 1150   GLUCOSE 129* 12/31/2014 1102   BUN 8.5 01/06/2015 1150   BUN 9 12/31/2014 1102   CREATININE 0.7 01/06/2015 1150   CREATININE 0.82 12/31/2014 1102   CALCIUM 9.8 01/06/2015 1150   CALCIUM 9.0 12/31/2014 1102   PROT 7.3 01/06/2015 1150   PROT 7.3 12/31/2014 1102   ALBUMIN 3.5 01/06/2015 1150   ALBUMIN 4.0 12/31/2014 1102   AST 16 01/06/2015 1150   AST 17 12/31/2014 1102   ALT 15 01/06/2015 1150   ALT 13 12/31/2014 1102   ALKPHOS 93 01/06/2015  1150   ALKPHOS 101 12/31/2014 1102   BILITOT 0.42 01/06/2015 1150   BILITOT 0.4 12/31/2014 1102   GFRNONAA 81* 12/31/2014 1102   GFRAA >90 12/31/2014 1102    INo results found for: SPEP, UPEP  Lab Results  Component Value Date   WBC 12.5* 01/06/2015   NEUTROABS 8.5* 01/06/2015   HGB 10.8* 01/06/2015   HCT 31.7* 01/06/2015   MCV 84.5 01/06/2015   PLT 455*  01/06/2015      Chemistry      Component Value Date/Time   NA 137 01/06/2015 1150   NA 133* 12/31/2014 1102   K 3.8 01/06/2015 1150   K 2.7* 12/31/2014 1102   CL 97 12/31/2014 1102   CO2 20* 01/06/2015 1150   CO2 26 12/31/2014 1102   BUN 8.5 01/06/2015 1150   BUN 9 12/31/2014 1102   CREATININE 0.7 01/06/2015 1150   CREATININE 0.82 12/31/2014 1102      Component Value Date/Time   CALCIUM 9.8 01/06/2015 1150   CALCIUM 9.0 12/31/2014 1102   ALKPHOS 93 01/06/2015 1150   ALKPHOS 101 12/31/2014 1102   AST 16 01/06/2015 1150   AST 17 12/31/2014 1102   ALT 15 01/06/2015 1150   ALT 13 12/31/2014 1102   BILITOT 0.42 01/06/2015 1150   BILITOT 0.4 12/31/2014 1102       No results found for: LABCA2  No components found for: LABCA125  No results for input(s): INR in the last 168 hours.  Urinalysis    Component Value Date/Time   COLORURINE YELLOW 01/14/2014 1206   APPEARANCEUR CLEAR 01/14/2014 1206   LABSPEC <=1.005* 01/14/2014 1206   PHURINE 5.5 01/14/2014 1206   GLUCOSEU NEGATIVE 01/14/2014 1206   HGBUR NEGATIVE 01/14/2014 1206   HGBUR negative 08/19/2010 1041   BILIRUBINUR NEGATIVE 01/14/2014 1206   KETONESUR NEGATIVE 01/14/2014 1206   UROBILINOGEN 0.2 01/14/2014 1206   NITRITE NEGATIVE 01/14/2014 1206   LEUKOCYTESUR NEGATIVE 01/14/2014 1206    STUDIES: Dg Chest 2 View  12/31/2014   CLINICAL DATA:  Recent diagnosis of breast carcinoma. Fever of unknown origin.  EXAM: CHEST  2 VIEW  COMPARISON:  November 06, 2014  FINDINGS: Port-A-Cath tip is in the superior vena cava. No pneumothorax. Lungs are clear.  Heart size and pulmonary vascularity are normal. No adenopathy. No bone lesions.  IMPRESSION: No edema or consolidation.  No mass or adenopathy appreciable.   Electronically Signed   By: Lowella Grip III M.D.   On: 12/31/2014 17:00   Mr Breast Bilateral W Wo Contrast  01/05/2015   CLINICAL DATA:  54 year old female with grade 3, triple negative, right breast invasive ductal carcinoma diagnosed in January, 2016. Evaluate response to neoadjuvant therapy.  LABS:  Not applicable.  EXAM: BILATERAL BREAST MRI WITH AND WITHOUT CONTRAST  TECHNIQUE: Multiplanar, multisequence MR images of both breasts were obtained prior to and following the intravenous administration of 20 ml of MultiHance  THREE-DIMENSIONAL MR IMAGE RENDERING ON INDEPENDENT WORKSTATION:  Three-dimensional MR images were rendered by post-processing of the original MR data on an independent workstation. The three-dimensional MR images were interpreted, and findings are reported in the following complete MRI report for this study. Three dimensional images were evaluated at the independent DynaCad workstation  COMPARISON:  10/26/2014 breast MRI.  FINDINGS: Breast composition: c.  Heterogeneous fibroglandular tissue.  Background parenchymal enhancement: Moderate.  Right breast: The irregular enhancing mass with central clip artifact located within the upper outer quadrant of the right breast corresponding to the known invasive ductal carcinoma has mildly decreased in size and now measures 1.7 x 1.5 x 1.5 cm. This previously measured 2.5 x 2.2 x 2.1 cm in size. The smaller mass previously seen located 5 mm anterior superior the mass measuring 1.4 cm in size is not visible today. In addition, the previously seen 6.5 cm area of non mass enhancement seen within the upper-outer quadrant of the right breast is no longer present today. There are no  additional findings.  Left breast: No mass or abnormal enhancement.  Lymph nodes: The previously seen mildly  prominent, cortically thickened right axillary lymph nodes appear less prominent.  Ancillary findings:  None.  IMPRESSION: 1. Decrease in size of the previously biopsied mass located within the upper outer quadrant of the right breast. This now measures 1.7 cm in size and previously measured 2.5 cm in size. 2. Resolution of the smaller more anteriorly and superiorly located enhancing mass and resolution of the previously seen non mass enhancement within the upper-outer quadrant of the right breast. 3. The previously prominent right axillary lymph nodes appear less prominent on today's study.  RECOMMENDATION: Treatment plan.  BI-RADS CATEGORY  6: Known biopsy-proven malignancy.   Electronically Signed   By: Altamese Cabal M.D.   On: 01/05/2015 10:51    ASSESSMENT: 54 y.o. 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   2) neoadjuvant chemotherapy to consist of cyclophosphamide and doxorubicin in dose dense fashion 4 given with Neulasta support, completed 01/06/2015, followed by weekly carboplatin and paclitaxel 12  (3) definitive surgery to follow chemotherapy  (4) adjuvant radiation, if appropriate, to follow surgery  PLAN: [name] [name] is doing well today. The labs were reviewed in detail and were entirely stable. Her ANC is 0.7 today. We reviewed her antiemetic schedule for the next chemotherapy regimen, and she knows to remove the dexamethasone from her home medicines. Of course the neulasta  injections will cease so she will stop the claratin.   [name] [name] will return next week for labs, a follow up visit, and cycle 1 of paclitaxel and carboplatin. She understands and agrees with this plan. She knows the goal of treatment in her case is cure. She has been encourage to call with any issues that might arise before her next visit here.    Laurie Panda, NP   01/14/2015 9:00 AM

## 2015-01-19 ENCOUNTER — Ambulatory Visit: Payer: Self-pay | Admitting: Internal Medicine

## 2015-01-21 ENCOUNTER — Encounter: Payer: Self-pay | Admitting: Nurse Practitioner

## 2015-01-21 ENCOUNTER — Other Ambulatory Visit: Payer: Self-pay | Admitting: Oncology

## 2015-01-21 ENCOUNTER — Ambulatory Visit (HOSPITAL_BASED_OUTPATIENT_CLINIC_OR_DEPARTMENT_OTHER): Payer: BC Managed Care – PPO

## 2015-01-21 ENCOUNTER — Other Ambulatory Visit (HOSPITAL_BASED_OUTPATIENT_CLINIC_OR_DEPARTMENT_OTHER): Payer: BC Managed Care – PPO

## 2015-01-21 ENCOUNTER — Ambulatory Visit (HOSPITAL_BASED_OUTPATIENT_CLINIC_OR_DEPARTMENT_OTHER): Payer: BC Managed Care – PPO | Admitting: Nurse Practitioner

## 2015-01-21 VITALS — BP 121/69 | HR 109 | Temp 98.7°F | Resp 20

## 2015-01-21 VITALS — BP 130/73 | HR 105 | Temp 98.8°F | Resp 18 | Ht 66.0 in | Wt 211.3 lb

## 2015-01-21 DIAGNOSIS — C50411 Malignant neoplasm of upper-outer quadrant of right female breast: Secondary | ICD-10-CM

## 2015-01-21 DIAGNOSIS — C50811 Malignant neoplasm of overlapping sites of right female breast: Secondary | ICD-10-CM | POA: Diagnosis not present

## 2015-01-21 DIAGNOSIS — Z171 Estrogen receptor negative status [ER-]: Secondary | ICD-10-CM | POA: Diagnosis not present

## 2015-01-21 DIAGNOSIS — Z5111 Encounter for antineoplastic chemotherapy: Secondary | ICD-10-CM | POA: Diagnosis not present

## 2015-01-21 DIAGNOSIS — I10 Essential (primary) hypertension: Secondary | ICD-10-CM

## 2015-01-21 DIAGNOSIS — G35 Multiple sclerosis: Secondary | ICD-10-CM

## 2015-01-21 LAB — COMPREHENSIVE METABOLIC PANEL (CC13)
ALBUMIN: 3.6 g/dL (ref 3.5–5.0)
ALK PHOS: 84 U/L (ref 40–150)
ALT: 16 U/L (ref 0–55)
AST: 13 U/L (ref 5–34)
Anion Gap: 15 mEq/L — ABNORMAL HIGH (ref 3–11)
BUN: 10.9 mg/dL (ref 7.0–26.0)
CALCIUM: 9.5 mg/dL (ref 8.4–10.4)
CHLORIDE: 103 meq/L (ref 98–109)
CO2: 23 mEq/L (ref 22–29)
Creatinine: 0.8 mg/dL (ref 0.6–1.1)
EGFR: >90 mL/min/{1.73_m2} (ref >90)
Glucose: 145 mg/dl — ABNORMAL HIGH (ref 70–140)
POTASSIUM: 3.2 meq/L — AB (ref 3.5–5.1)
SODIUM: 141 meq/L (ref 136–145)
TOTAL PROTEIN: 6.6 g/dL (ref 6.4–8.3)
Total Bilirubin: 0.4 mg/dL (ref 0.20–1.20)

## 2015-01-21 LAB — CBC WITH DIFFERENTIAL/PLATELET
BASO%: 0.4 % (ref 0.0–2.0)
BASOS ABS: 0 10*3/uL (ref 0.0–0.1)
EOS ABS: 0 10*3/uL (ref 0.0–0.5)
EOS%: 0.6 % (ref 0.0–7.0)
HCT: 30.7 % — ABNORMAL LOW (ref 34.8–46.6)
HEMOGLOBIN: 10.1 g/dL — AB (ref 11.6–15.9)
LYMPH%: 10.3 % — ABNORMAL LOW (ref 14.0–49.7)
MCH: 28.5 pg (ref 25.1–34.0)
MCHC: 32.9 g/dL (ref 31.5–36.0)
MCV: 86.5 fL (ref 79.5–101.0)
MONO#: 1.1 10*3/uL — ABNORMAL HIGH (ref 0.1–0.9)
MONO%: 17 % — ABNORMAL HIGH (ref 0.0–14.0)
NEUT#: 4.8 10*3/uL (ref 1.5–6.5)
NEUT%: 71.7 % (ref 38.4–76.8)
Platelets: 219 10*3/uL (ref 145–400)
RBC: 3.55 10*6/uL — ABNORMAL LOW (ref 3.70–5.45)
RDW: 18.2 % — ABNORMAL HIGH (ref 11.2–14.5)
WBC: 6.7 10*3/uL (ref 3.9–10.3)
lymph#: 0.7 10*3/uL — ABNORMAL LOW (ref 0.9–3.3)

## 2015-01-21 MED ORDER — FAMOTIDINE IN NACL 20-0.9 MG/50ML-% IV SOLN
20.0000 mg | Freq: Once | INTRAVENOUS | Status: AC
  Administered 2015-01-21: 20 mg via INTRAVENOUS

## 2015-01-21 MED ORDER — PACLITAXEL CHEMO INJECTION 300 MG/50ML
80.0000 mg/m2 | Freq: Once | INTRAVENOUS | Status: AC
  Administered 2015-01-21: 168 mg via INTRAVENOUS
  Filled 2015-01-21: qty 28

## 2015-01-21 MED ORDER — SODIUM CHLORIDE 0.9 % IV SOLN
Freq: Once | INTRAVENOUS | Status: AC
  Administered 2015-01-21: 11:00:00 via INTRAVENOUS

## 2015-01-21 MED ORDER — FAMOTIDINE IN NACL 20-0.9 MG/50ML-% IV SOLN
INTRAVENOUS | Status: AC
  Filled 2015-01-21: qty 50

## 2015-01-21 MED ORDER — SODIUM CHLORIDE 0.9 % IV SOLN
Freq: Once | INTRAVENOUS | Status: AC
  Administered 2015-01-21: 11:00:00 via INTRAVENOUS
  Filled 2015-01-21: qty 8

## 2015-01-21 MED ORDER — HEPARIN SOD (PORK) LOCK FLUSH 100 UNIT/ML IV SOLN
500.0000 [IU] | Freq: Once | INTRAVENOUS | Status: AC | PRN
  Administered 2015-01-21: 500 [IU]
  Filled 2015-01-21: qty 5

## 2015-01-21 MED ORDER — SODIUM CHLORIDE 0.9 % IJ SOLN
10.0000 mL | INTRAMUSCULAR | Status: DC | PRN
  Administered 2015-01-21: 10 mL
  Filled 2015-01-21: qty 10

## 2015-01-21 MED ORDER — DIPHENHYDRAMINE HCL 50 MG/ML IJ SOLN
INTRAMUSCULAR | Status: AC
  Filled 2015-01-21: qty 1

## 2015-01-21 MED ORDER — DIPHENHYDRAMINE HCL 50 MG/ML IJ SOLN
25.0000 mg | Freq: Once | INTRAMUSCULAR | Status: AC
  Administered 2015-01-21: 25 mg via INTRAVENOUS

## 2015-01-21 MED ORDER — CARBOPLATIN CHEMO INJECTION 450 MG/45ML
300.0000 mg | Freq: Once | INTRAVENOUS | Status: AC
  Administered 2015-01-21: 300 mg via INTRAVENOUS
  Filled 2015-01-21: qty 30

## 2015-01-21 NOTE — Progress Notes (Signed)
[facility]  [phone] [phone]     ID: [name] [name] DOB: 1961/03/16  MR#: [id number]  PNP#:[id number]  Patient Care Team: [name], MD as PCP - General [name], MD as Consulting Physician (Obstetrics and Gynecology) [name], MD (Gastroenterology) [name]. [name], MD (Neurology) [name], MD as Consulting Physician (General Surgery) [name], MD as Consulting Physician (Oncology) [name], MD as Attending Physician (Radiation Oncology) [name], RN as Registered Nurse [name], RN as Registered Nurse OTHER MD:  CHIEF COMPLAINT: Triple negative breast cancer  CURRENT TREATMENT: Neoadjuvant chemotherapy  BREAST CANCER HISTORY: From the original intake note:  [name] [name] had bilateral screening mammography at the [facility] 11/21/2013. Breast density was category C. Exam was unremarkable. In January 2016 however the patient palpated a change in her right breast laterally. She contacted her gynecologist, Dr. [name], and he set her up for a unilateral right diagnostic mammography with right breast ultrasonography at [facility] 10/15/2014. There was a focal irregular opacity in the lateral right breast associated with pleomorphic calcifications. This was palpable on exam as a firm non-mobile mass. Ultrasound confirmed a hypoechoic mass in the right breast at the 9:00 position 5 cm from the nipple, measuring 2.9 cm. Next to this mass was a normal-sized intramammary lymph node measuring 4 mm. In the right axilla there were 3 contiguous level I lymph nodes the largest measuring 11 mm.  On 10/20/2014 the patient underwent right core needle biopsy of the 9:00 breast mass, showing ([id number]) and invasive ductal carcinoma, grade 3, triple negative, with the HER-2/neu signals ratio of 1.26 and number per cell 2.15. The MIB-1 was 71%. Biopsy of the largest right axillary lymph node was negative. Dr.  Owens Shark the mammographer who performed a biopsy describes this is concordant.  Left breast mammography 10/26/2014 was negative, and bilateral breast MRI the same day confirmed, in the right breast, and upper outer quadrant mass measuring 2.5 cm, with a second mass measuring 1.4 cm slightly anterior and superior to it. In addition there was a total area of approximately 6.5 cm of non-masslike enhancement extending throughout the upper outer quadrant of the right breast. There was a cortically thickened right axillary lymph node which had been biopsied. There was also a mildly cortically thickened right subpectoral lymph node. There were no internal mammary or left axillary lymph nodes in the left breast was unremarkable.  The patient's subsequent history is as detailed below.  INTERVAL HISTORY: [name] [name] returns today for follow up of her breast cancer. Today is day 1, cycle 1 of 12 of paclitaxel and carboplatin. The interval history is completely unremarkable. [name] [name] has no complaints to offer today.  REVIEW OF SYSTEMS: [name] [name] denies fevers, chills, nausea, vomiting, or changes in bowel or bladder habits. She is eating and drinking well. She has no shortness of breath, chest pain, cough, or palpitations. She has good energy, and sleeps well at night. A detailed review of systems is otherwise stable.   PAST MEDICAL HISTORY: Past Medical History  Diagnosis Date  . METABOLIC SYNDROME X   . OBESITY, TRUNCAL   . CONSTIPATION, CHRONIC   . KELOID   . Multiple sclerosis   . Allergic rhinitis due to other allergen   . HYPERTENSION   . HYPERLIPIDEMIA   . GERD   . Angioedema     ACEI  . Colon polyps 05/2014    adenomatous, colo q 5y  . Migraine headache   .  Allergy   . Anxiety   . Breast cancer of upper-outer quadrant of right female breast 10/23/2014  . Hot flashes     PAST SURGICAL HISTORY: Past Surgical History  Procedure Laterality Date  . Tubal ligation    . Colonoscopy    . Portacath  placement Left 11/06/2014    Procedure: INSERTION PORT-A-CATH;  Surgeon: [name], MD;  Location: [facility];  Service: General;  Laterality: Left;    FAMILY HISTORY Family History  Problem Relation Age of Onset  . Coronary artery disease Mother   . Heart disease Mother   . Diabetes Mother   . Hypertension Other   . Hyperlipidemia Other   . Diabetes Other   . Colon cancer Neg Hx   . Alcohol abuse Father   . COPD Maternal Grandmother    the patient's father died at the age of 76 following his seizure. The patient's mother died from congestive 17 failure at the age of 66. The patient had 5 brothers and 2 sisters. One brother died from Escherichia coli infection, the other one had a history of seizure disorder. One sister died from causes unknown to the patient. There is no history of breast or ovarian cancer in the family.  GYNECOLOGIC HISTORY:  Patient's last menstrual period was 11/01/2012. Menarche age 44, first live birth age 32. The patient is GX P2. She stopped having periods in May 2014. She did not take hormone replacement. She did take her control pills remotely for approximately 4 years with no complications.  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 She describes herself is single, and lives by herself. Her daughter [name] [name] lives in [place] and works in Ambulance person for [employer]. Son [name] [name] "[name] [name]" Bergin is an Engineer, building services for [employer]: Not in place   HEALTH MAINTENANCE: History  Substance Use Topics  . Smoking status: Never Smoker   . Smokeless tobacco: Never Used     Comment: Single- brother staying with pt since mom's death  . Alcohol Use: No     Colonoscopy: September 2015/[name]  PAP: February 2015  Bone density:  Lipid panel:  Allergies  Allergen Reactions  . Ace Inhibitors Anaphylaxis    REACTION: Angioedema (tongue  swelling)  . Clarithromycin Anaphylaxis    Throat swells  . Levaquin [Levofloxacin] Other (See Comments)    Tendon pain - shoulder and calf    Current Outpatient Prescriptions  Medication Sig Dispense Refill  . ALPRAZolam (XANAX) 0.5 MG tablet Take 1 tablet (0.5 mg total) by mouth 3 (three) times daily as needed for anxiety or sleep. 30 tablet 1  . dexamethasone (DECADRON) 4 MG tablet Take 2 tablets by mouth once a day on the day after chemotherapy and then take 2 tablets two times a day for 2 days. Take with food. 30 tablet 1  . Glatiramer Acetate (COPAXONE) 40 MG/ML SOSY Inject 40 mg into the skin 3 (three) times a week. 12 Syringe 0  . hydrochlorothiazide (HYDRODIURIL) 25 MG tablet TAKE 1 TABLET DAILY 90 tablet 3  . lidocaine-prilocaine (EMLA) cream Apply to affected area once 30 g 3  . loratadine-pseudoephedrine (CLARITIN-D 12 HOUR) 5-120 MG per tablet Take 1 tablet by mouth as needed for allergies. 30 tablet 3  . losartan (COZAAR) 50 MG tablet Take 1 tablet (50 mg total) by mouth 2 (two) times daily. 180 tablet 3  . metoprolol (LOPRESSOR) 50 MG  tablet TAKE 1 TABLET TWICE A DAY 180 tablet 3  . pantoprazole (PROTONIX) 40 MG tablet Take 1 tablet (40 mg total) by mouth daily. 90 tablet 1  . potassium chloride (MICRO-K) 10 MEQ CR capsule Take 1 capsule (10 mEq total) by mouth 2 (two) times daily. (Patient taking differently: Take 10 mEq by mouth 2 (two) times daily. Pt taking 2 - 10 mEq tablets 2 times a day. Total dose 40 mEq daily.) 180 capsule 0  . ciprofloxacin (CIPRO) 500 MG tablet Take 1 tablet (500 mg total) by mouth 2 (two) times daily. (Patient not taking: Reported on 01/21/2015) 10 tablet 0  . [DISCONTINUED] mometasone (NASONEX) 50 MCG/ACT nasal spray Place 2 sprays into the nose daily. 17 g 2   No current facility-administered medications for this visit.    OBJECTIVE: Middle-aged African-American woman in no acute distress Filed Vitals:   01/21/15 0842  BP: 130/73  [name]: 105    Temp: 98.8 F (37.1 C)  Resp: 18     Body mass index is 34.12 kg/(m^2).    ECOG FS:0 - Asymptomatic  Sclerae unicteric, pupils equal and reactive Oropharynx clear and moist-- no thrush No cervical or supraclavicular adenopathy Lungs no rales or rhonchi Heart regular rate and rhythm Abd soft, nontender, positive bowel sounds MSK no focal spinal tenderness, no upper extremity lymphedema Neuro: nonfocal, well oriented, appropriate affect Breasts: deferred  LAB RESULTS:  CMP     Component Value Date/Time   NA 141 01/21/2015 0831   NA 133* 12/31/2014 1102   K 3.2* 01/21/2015 0831   K 2.7* 12/31/2014 1102   CL 97 12/31/2014 1102   CO2 23 01/21/2015 0831   CO2 26 12/31/2014 1102   GLUCOSE 145* 01/21/2015 0831   GLUCOSE 129* 12/31/2014 1102   BUN 10.9 01/21/2015 0831   BUN 9 12/31/2014 1102   CREATININE 0.8 01/21/2015 0831   CREATININE 0.82 12/31/2014 1102   CALCIUM 9.5 01/21/2015 0831   CALCIUM 9.0 12/31/2014 1102   PROT 6.6 01/21/2015 0831   PROT 7.3 12/31/2014 1102   ALBUMIN 3.6 01/21/2015 0831   ALBUMIN 4.0 12/31/2014 1102   AST 13 01/21/2015 0831   AST 17 12/31/2014 1102   ALT 16 01/21/2015 0831   ALT 13 12/31/2014 1102   ALKPHOS 84 01/21/2015 0831   ALKPHOS 101 12/31/2014 1102   BILITOT 0.40 01/21/2015 0831   BILITOT 0.4 12/31/2014 1102   GFRNONAA 81* 12/31/2014 1102   GFRAA >90 12/31/2014 1102    INo results found for: SPEP, UPEP  Lab Results  Component Value Date   WBC 6.7 01/21/2015   NEUTROABS 4.8 01/21/2015   HGB 10.1* 01/21/2015   HCT 30.7* 01/21/2015   MCV 86.5 01/21/2015   PLT 219 01/21/2015      Chemistry      Component Value Date/Time   NA 141 01/21/2015 0831   NA 133* 12/31/2014 1102   K 3.2* 01/21/2015 0831   K 2.7* 12/31/2014 1102   CL 97 12/31/2014 1102   CO2 23 01/21/2015 0831   CO2 26 12/31/2014 1102   BUN 10.9 01/21/2015 0831   BUN 9 12/31/2014 1102   CREATININE 0.8 01/21/2015 0831   CREATININE 0.82 12/31/2014 1102       Component Value Date/Time   CALCIUM 9.5 01/21/2015 0831   CALCIUM 9.0 12/31/2014 1102   ALKPHOS 84 01/21/2015 0831   ALKPHOS 101 12/31/2014 1102   AST 13 01/21/2015 0831   AST 17 12/31/2014 1102   ALT 16  01/21/2015 0831   ALT 13 12/31/2014 1102   BILITOT 0.40 01/21/2015 0831   BILITOT 0.4 12/31/2014 1102       No results found for: LABCA2  No components found for: LABCA125  No results for input(s): INR in the last 168 hours.  Urinalysis    Component Value Date/Time   COLORURINE YELLOW 01/14/2014 1206   APPEARANCEUR CLEAR 01/14/2014 1206   LABSPEC <=1.005* 01/14/2014 1206   PHURINE 5.5 01/14/2014 1206   GLUCOSEU NEGATIVE 01/14/2014 1206   HGBUR NEGATIVE 01/14/2014 1206   HGBUR negative 08/19/2010 1041   BILIRUBINUR NEGATIVE 01/14/2014 1206   KETONESUR NEGATIVE 01/14/2014 1206   UROBILINOGEN 0.2 01/14/2014 1206   NITRITE NEGATIVE 01/14/2014 1206   LEUKOCYTESUR NEGATIVE 01/14/2014 1206    STUDIES: Dg Chest 2 View  12/31/2014   CLINICAL DATA:  Recent diagnosis of breast carcinoma. Fever of unknown origin.  EXAM: CHEST  2 VIEW  COMPARISON:  November 06, 2014  FINDINGS: Port-A-Cath tip is in the superior vena cava. No pneumothorax. Lungs are clear. Heart size and pulmonary vascularity are normal. No adenopathy. No bone lesions.  IMPRESSION: No edema or consolidation.  No mass or adenopathy appreciable.   Electronically Signed   By: Lowella Grip III M.D.   On: 12/31/2014 17:00   Mr Breast Bilateral W Wo Contrast  01/05/2015   CLINICAL DATA:  54 year old female with grade 3, triple negative, right breast invasive ductal carcinoma diagnosed in January, 2016. Evaluate response to neoadjuvant therapy.  LABS:  Not applicable.  EXAM: BILATERAL BREAST MRI WITH AND WITHOUT CONTRAST  TECHNIQUE: Multiplanar, multisequence MR images of both breasts were obtained prior to and following the intravenous administration of 20 ml of MultiHance  THREE-DIMENSIONAL MR IMAGE RENDERING ON  INDEPENDENT WORKSTATION:  Three-dimensional MR images were rendered by post-processing of the original MR data on an independent workstation. The three-dimensional MR images were interpreted, and findings are reported in the following complete MRI report for this study. Three dimensional images were evaluated at the independent DynaCad workstation  COMPARISON:  10/26/2014 breast MRI.  FINDINGS: Breast composition: c.  Heterogeneous fibroglandular tissue.  Background parenchymal enhancement: Moderate.  Right breast: The irregular enhancing mass with central clip artifact located within the upper outer quadrant of the right breast corresponding to the known invasive ductal carcinoma has mildly decreased in size and now measures 1.7 x 1.5 x 1.5 cm. This previously measured 2.5 x 2.2 x 2.1 cm in size. The smaller mass previously seen located 5 mm anterior superior the mass measuring 1.4 cm in size is not visible today. In addition, the previously seen 6.5 cm area of non mass enhancement seen within the upper-outer quadrant of the right breast is no longer present today. There are no additional findings.  Left breast: No mass or abnormal enhancement.  Lymph nodes: The previously seen mildly prominent, cortically thickened right axillary lymph nodes appear less prominent.  Ancillary findings:  None.  IMPRESSION: 1. Decrease in size of the previously biopsied mass located within the upper outer quadrant of the right breast. This now measures 1.7 cm in size and previously measured 2.5 cm in size. 2. Resolution of the smaller more anteriorly and superiorly located enhancing mass and resolution of the previously seen non mass enhancement within the upper-outer quadrant of the right breast. 3. The previously prominent right axillary lymph nodes appear less prominent on today's study.  RECOMMENDATION: Treatment plan.  BI-RADS CATEGORY  6: Known biopsy-proven malignancy.   Electronically Signed   By:  Altamese Cabal M.D.   On:  01/05/2015 10:51    ASSESSMENT: 54 y.o. 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   2) neoadjuvant chemotherapy to consist of cyclophosphamide and doxorubicin in dose dense fashion 4 given with Neulasta support, completed 01/06/2015, followed by weekly carboplatin and paclitaxel 12  (3) definitive surgery to follow chemotherapy  (4) adjuvant radiation, if appropriate, to follow surgery  PLAN: [name] [name] is ready to begin her next phase of chemotherapy. The labs were reviewed in detail and were entirely stable. She will proceed with day 1, cycle 1 of paclitaxel and carboplatin as planned today.   [name] [name] will return in 1 week for labs, a follow up visit, and cycle 2 of treatment. She understands and agrees with this plan. She knows the goal of treatment in her case is cure. She has been encouraged to call with any issues that might arise before her next visit here.    Laurie Panda, NP   01/21/2015 9:04 AM

## 2015-01-21 NOTE — Patient Instructions (Signed)
[facility] Discharge Instructions for Patients Receiving Chemotherapy  Today you received the following chemotherapy agents taxol and carboplatin.  To help prevent nausea and vomiting after your treatment, we encourage you to take your nausea medication zofran.   If you develop nausea and vomiting that is not controlled by your nausea medication, call the clinic.   BELOW ARE SYMPTOMS THAT SHOULD BE REPORTED IMMEDIATELY:  *FEVER GREATER THAN 100.5 F  *CHILLS WITH OR WITHOUT FEVER  NAUSEA AND VOMITING THAT IS NOT CONTROLLED WITH YOUR NAUSEA MEDICATION  *UNUSUAL SHORTNESS OF BREATH  *UNUSUAL BRUISING OR BLEEDING  TENDERNESS IN MOUTH AND THROAT WITH OR WITHOUT PRESENCE OF ULCERS  *URINARY PROBLEMS  *BOWEL PROBLEMS  UNUSUAL RASH Items with * indicate a potential emergency and should be followed up as soon as possible.  Feel free to call the clinic you have any questions or concerns. The clinic phone number is [phone].  Please show the [facility] at check-in to the Emergency Department and triage nurse.

## 2015-01-22 ENCOUNTER — Telehealth: Payer: Self-pay | Admitting: *Deleted

## 2015-01-22 NOTE — Telephone Encounter (Signed)
This RN attempted to call pt at mobile and home numbers with obtaining unidentified VM for home number.  General message left to return call to this RN.  Call is follow up chemo call.  Will await return call.

## 2015-01-28 ENCOUNTER — Ambulatory Visit (HOSPITAL_BASED_OUTPATIENT_CLINIC_OR_DEPARTMENT_OTHER): Payer: BC Managed Care – PPO

## 2015-01-28 ENCOUNTER — Encounter: Payer: Self-pay | Admitting: Nurse Practitioner

## 2015-01-28 ENCOUNTER — Ambulatory Visit: Payer: BC Managed Care – PPO | Admitting: Nutrition

## 2015-01-28 ENCOUNTER — Ambulatory Visit (HOSPITAL_BASED_OUTPATIENT_CLINIC_OR_DEPARTMENT_OTHER): Payer: BC Managed Care – PPO | Admitting: Nurse Practitioner

## 2015-01-28 ENCOUNTER — Other Ambulatory Visit (HOSPITAL_BASED_OUTPATIENT_CLINIC_OR_DEPARTMENT_OTHER): Payer: BC Managed Care – PPO

## 2015-01-28 VITALS — BP 126/69 | HR 98 | Temp 98.2°F | Resp 18 | Ht 66.0 in | Wt 213.6 lb

## 2015-01-28 DIAGNOSIS — L910 Hypertrophic scar: Secondary | ICD-10-CM

## 2015-01-28 DIAGNOSIS — Z1379 Encounter for other screening for genetic and chromosomal anomalies: Secondary | ICD-10-CM

## 2015-01-28 DIAGNOSIS — E876 Hypokalemia: Secondary | ICD-10-CM

## 2015-01-28 DIAGNOSIS — F419 Anxiety disorder, unspecified: Secondary | ICD-10-CM

## 2015-01-28 DIAGNOSIS — T451X5A Adverse effect of antineoplastic and immunosuppressive drugs, initial encounter: Secondary | ICD-10-CM

## 2015-01-28 DIAGNOSIS — C50811 Malignant neoplasm of overlapping sites of right female breast: Secondary | ICD-10-CM | POA: Diagnosis not present

## 2015-01-28 DIAGNOSIS — Z5111 Encounter for antineoplastic chemotherapy: Secondary | ICD-10-CM | POA: Diagnosis not present

## 2015-01-28 DIAGNOSIS — C50411 Malignant neoplasm of upper-outer quadrant of right female breast: Secondary | ICD-10-CM

## 2015-01-28 DIAGNOSIS — G35 Multiple sclerosis: Secondary | ICD-10-CM

## 2015-01-28 DIAGNOSIS — Z171 Estrogen receptor negative status [ER-]: Secondary | ICD-10-CM | POA: Diagnosis not present

## 2015-01-28 DIAGNOSIS — K5909 Other constipation: Secondary | ICD-10-CM

## 2015-01-28 DIAGNOSIS — E8881 Metabolic syndrome: Secondary | ICD-10-CM

## 2015-01-28 DIAGNOSIS — I10 Essential (primary) hypertension: Secondary | ICD-10-CM

## 2015-01-28 DIAGNOSIS — E65 Localized adiposity: Secondary | ICD-10-CM

## 2015-01-28 DIAGNOSIS — D701 Agranulocytosis secondary to cancer chemotherapy: Secondary | ICD-10-CM

## 2015-01-28 LAB — COMPREHENSIVE METABOLIC PANEL (CC13)
ALK PHOS: 76 U/L (ref 40–150)
ALT: 22 U/L (ref 0–55)
AST: 19 U/L (ref 5–34)
Albumin: 3.6 g/dL (ref 3.5–5.0)
Anion Gap: 11 mEq/L (ref 3–11)
BILIRUBIN TOTAL: 0.34 mg/dL (ref 0.20–1.20)
BUN: 8.3 mg/dL (ref 7.0–26.0)
CO2: 24 mEq/L (ref 22–29)
Calcium: 9.4 mg/dL (ref 8.4–10.4)
Chloride: 106 mEq/L (ref 98–109)
Creatinine: 0.7 mg/dL (ref 0.6–1.1)
Glucose: 140 mg/dl (ref 70–140)
Potassium: 3.9 mEq/L (ref 3.5–5.1)
SODIUM: 140 meq/L (ref 136–145)
TOTAL PROTEIN: 6.4 g/dL (ref 6.4–8.3)

## 2015-01-28 LAB — CBC WITH DIFFERENTIAL/PLATELET
BASO%: 0.9 % (ref 0.0–2.0)
BASOS ABS: 0.1 10*3/uL (ref 0.0–0.1)
EOS%: 3.4 % (ref 0.0–7.0)
Eosinophils Absolute: 0.2 10*3/uL (ref 0.0–0.5)
HEMATOCRIT: 30.5 % — AB (ref 34.8–46.6)
HEMOGLOBIN: 10.2 g/dL — AB (ref 11.6–15.9)
LYMPH%: 14.6 % (ref 14.0–49.7)
MCH: 29.1 pg (ref 25.1–34.0)
MCHC: 33.4 g/dL (ref 31.5–36.0)
MCV: 86.9 fL (ref 79.5–101.0)
MONO#: 0.6 10*3/uL (ref 0.1–0.9)
MONO%: 9.1 % (ref 0.0–14.0)
NEUT#: 4.9 10*3/uL (ref 1.5–6.5)
NEUT%: 72 % (ref 38.4–76.8)
PLATELETS: 360 10*3/uL (ref 145–400)
RBC: 3.51 10*6/uL — ABNORMAL LOW (ref 3.70–5.45)
RDW: 19.2 % — ABNORMAL HIGH (ref 11.2–14.5)
WBC: 6.8 10*3/uL (ref 3.9–10.3)
lymph#: 1 10*3/uL (ref 0.9–3.3)
nRBC: 5 % — ABNORMAL HIGH (ref 0–0)

## 2015-01-28 MED ORDER — SODIUM CHLORIDE 0.9 % IV SOLN
300.0000 mg | Freq: Once | INTRAVENOUS | Status: AC
  Administered 2015-01-28: 300 mg via INTRAVENOUS
  Filled 2015-01-28: qty 30

## 2015-01-28 MED ORDER — SODIUM CHLORIDE 0.9 % IV SOLN
Freq: Once | INTRAVENOUS | Status: AC
  Administered 2015-01-28: 11:00:00 via INTRAVENOUS

## 2015-01-28 MED ORDER — HEPARIN SOD (PORK) LOCK FLUSH 100 UNIT/ML IV SOLN
500.0000 [IU] | Freq: Once | INTRAVENOUS | Status: AC | PRN
  Administered 2015-01-28: 500 [IU]
  Filled 2015-01-28: qty 5

## 2015-01-28 MED ORDER — SODIUM CHLORIDE 0.9 % IJ SOLN
10.0000 mL | INTRAMUSCULAR | Status: DC | PRN
  Administered 2015-01-28: 10 mL
  Filled 2015-01-28: qty 10

## 2015-01-28 MED ORDER — PACLITAXEL CHEMO INJECTION 300 MG/50ML
80.0000 mg/m2 | Freq: Once | INTRAVENOUS | Status: AC
  Administered 2015-01-28: 168 mg via INTRAVENOUS
  Filled 2015-01-28: qty 28

## 2015-01-28 MED ORDER — DIPHENHYDRAMINE HCL 50 MG/ML IJ SOLN
INTRAMUSCULAR | Status: AC
  Filled 2015-01-28: qty 1

## 2015-01-28 MED ORDER — DIPHENHYDRAMINE HCL 50 MG/ML IJ SOLN
25.0000 mg | Freq: Once | INTRAMUSCULAR | Status: AC
  Administered 2015-01-28: 25 mg via INTRAVENOUS

## 2015-01-28 MED ORDER — FAMOTIDINE IN NACL 20-0.9 MG/50ML-% IV SOLN
INTRAVENOUS | Status: AC
  Filled 2015-01-28: qty 50

## 2015-01-28 MED ORDER — SODIUM CHLORIDE 0.9 % IV SOLN
Freq: Once | INTRAVENOUS | Status: AC
  Administered 2015-01-28: 11:00:00 via INTRAVENOUS
  Filled 2015-01-28: qty 8

## 2015-01-28 MED ORDER — FAMOTIDINE IN NACL 20-0.9 MG/50ML-% IV SOLN
20.0000 mg | Freq: Once | INTRAVENOUS | Status: AC
  Administered 2015-01-28: 20 mg via INTRAVENOUS

## 2015-01-28 NOTE — Patient Instructions (Signed)
[facility] Discharge Instructions for Patients Receiving Chemotherapy  Today you received the following chemotherapy agents:  Taxol and Carboplatin  To help prevent nausea and vomiting after your treatment, we encourage you to take your nausea medication as ordered per MD.   If you develop nausea and vomiting that is not controlled by your nausea medication, call the clinic.   BELOW ARE SYMPTOMS THAT SHOULD BE REPORTED IMMEDIATELY:  *FEVER GREATER THAN 100.5 F  *CHILLS WITH OR WITHOUT FEVER  NAUSEA AND VOMITING THAT IS NOT CONTROLLED WITH YOUR NAUSEA MEDICATION  *UNUSUAL SHORTNESS OF BREATH  *UNUSUAL BRUISING OR BLEEDING  TENDERNESS IN MOUTH AND THROAT WITH OR WITHOUT PRESENCE OF ULCERS  *URINARY PROBLEMS  *BOWEL PROBLEMS  UNUSUAL RASH Items with * indicate a potential emergency and should be followed up as soon as possible.  Feel free to call the clinic you have any questions or concerns. The clinic phone number is [phone].  Please show the CHEMO ALERT CARD at check-in to the Emergency Department and triage nurse.   

## 2015-01-28 NOTE — Progress Notes (Signed)
[place]  [phone] [phone]     ID: [name] [name] DOB: 1961/06/29  MR#: [id number]  CLE#:[id number]  Patient Care Team: [name], MD as PCP - General [name], MD as Consulting Physician (Obstetrics and Gynecology) [name], MD (Gastroenterology) [name]. [name], MD (Neurology) [name], MD as Consulting Physician (General Surgery) [name], MD as Consulting Physician (Oncology) [name], MD as Attending Physician (Radiation Oncology) [name], RN as Registered Nurse [name], RN as Registered Nurse OTHER MD:  CHIEF COMPLAINT: Triple negative breast cancer  CURRENT TREATMENT: Neoadjuvant chemotherapy  BREAST CANCER HISTORY: From the original intake note:  [name] had bilateral screening mammography at the [facility] 11/21/2013. Breast density was category C. Exam was unremarkable. In January 2016 however the patient palpated a change in her right breast laterally. She contacted her gynecologist, Dr. [name], and he set her up for a unilateral right diagnostic mammography with right breast ultrasonography at [facility] 10/15/2014. There was a focal irregular opacity in the lateral right breast associated with pleomorphic calcifications. This was palpable on exam as a firm non-mobile mass. Ultrasound confirmed a hypoechoic mass in the right breast at the 9:00 position 5 cm from the nipple, measuring 2.9 cm. Next to this mass was a normal-sized intramammary lymph node measuring 4 mm. In the right axilla there were 3 contiguous level I lymph nodes the largest measuring 11 mm.  On 10/20/2014 the patient underwent right core needle biopsy of the 9:00 breast mass, showing ([id number]) and invasive ductal carcinoma, grade 3, triple negative, with the HER-2/neu signals ratio of 1.26 and number per cell 2.15. The MIB-1 was 71%. Biopsy of the largest right axillary lymph node was negative. Dr.  Owens Shark the mammographer who performed a biopsy describes this is concordant.  Left breast mammography 10/26/2014 was negative, and bilateral breast MRI the same day confirmed, in the right breast, and upper outer quadrant mass measuring 2.5 cm, with a second mass measuring 1.4 cm slightly anterior and superior to it. In addition there was a total area of approximately 6.5 cm of non-masslike enhancement extending throughout the upper outer quadrant of the right breast. There was a cortically thickened right axillary lymph node which had been biopsied. There was also a mildly cortically thickened right subpectoral lymph node. There were no internal mammary or left axillary lymph nodes in the left breast was unremarkable.  The patient's subsequent history is as detailed below.  INTERVAL HISTORY: [name] returns today for follow up of her breast cancer. Today is day 1, cycle 2 of 12 of paclitaxel and carboplatin. She tolerated her first cycle well with no complaints or reactions.   REVIEW OF SYSTEMS: [name] denies fevers, chills, nausea, vomiting, or changes in bowel or bladder habits. She is eating and drinking well. She has no mouth sores, rashes, or neuropathy symptoms. She has no shortness of breath, chest pain, cough, or palpitations. She has good energy, and sleeps well at night. A detailed review of systems is otherwise stable.   PAST MEDICAL HISTORY: Past Medical History  Diagnosis Date  . METABOLIC SYNDROME X   . OBESITY, TRUNCAL   . CONSTIPATION, CHRONIC   . KELOID   . Multiple sclerosis   . Allergic rhinitis due to other allergen   . HYPERTENSION   . HYPERLIPIDEMIA   . GERD   . Angioedema     ACEI  . Colon polyps 05/2014    adenomatous, colo  q 5y  . Migraine headache   . Allergy   . Anxiety   . Breast cancer of upper-outer quadrant of right female breast 10/23/2014  . Hot flashes     PAST SURGICAL HISTORY: Past Surgical History  Procedure Laterality Date  . Tubal ligation    .  Colonoscopy    . Portacath placement Left 11/06/2014    Procedure: INSERTION PORT-A-CATH;  Surgeon: [name], MD;  Location: [facility];  Service: General;  Laterality: Left;    FAMILY HISTORY Family History  Problem Relation Age of Onset  . Coronary artery disease Mother   . Heart disease Mother   . Diabetes Mother   . Hypertension Other   . Hyperlipidemia Other   . Diabetes Other   . Colon cancer Neg Hx   . Alcohol abuse Father   . COPD Maternal Grandmother    the patient's father died at the age of 39 following his seizure. The patient's mother died from congestive 71 failure at the age of 13. The patient had 5 brothers and 2 sisters. One brother died from Escherichia coli infection, the other one had a history of seizure disorder. One sister died from causes unknown to the patient. There is no history of breast or ovarian cancer in the family.  GYNECOLOGIC HISTORY:  Patient's last menstrual period was 11/01/2012. Menarche age 55, first live birth age 36. The patient is GX P2. She stopped having periods in May 2014. She did not take hormone replacement. She did take her control pills remotely for approximately 4 years with no complications.  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 She describes herself is single, and lives by herself. Her daughter [name] [name] lives in [place] and works in Ambulance person for [employer]. Son [name] [name] is an Engineer, building services for [employer]: Not in place   HEALTH MAINTENANCE: History  Substance Use Topics  . Smoking status: Never Smoker   . Smokeless tobacco: Never Used     Comment: Single- brother staying with pt since mom's death  . Alcohol Use: No     Colonoscopy: September 2015/[name]  PAP: February 2015  Bone density:  Lipid panel:  Allergies  Allergen Reactions  . Ace Inhibitors Anaphylaxis     REACTION: Angioedema (tongue swelling)  . Clarithromycin Anaphylaxis    Throat swells  . Levaquin [Levofloxacin] Other (See Comments)    Tendon pain - shoulder and calf    Current Outpatient Prescriptions  Medication Sig Dispense Refill  . ALPRAZolam (XANAX) 0.5 MG tablet Take 1 tablet (0.5 mg total) by mouth 3 (three) times daily as needed for anxiety or sleep. 30 tablet 1  . Glatiramer Acetate (COPAXONE) 40 MG/ML SOSY Inject 40 mg into the skin 3 (three) times a week. 12 Syringe 0  . hydrochlorothiazide (HYDRODIURIL) 25 MG tablet TAKE 1 TABLET DAILY 90 tablet 3  . lidocaine-prilocaine (EMLA) cream Apply to affected area once 30 g 3  . losartan (COZAAR) 50 MG tablet Take 1 tablet (50 mg total) by mouth 2 (two) times daily. 180 tablet 3  . metoprolol (LOPRESSOR) 50 MG tablet TAKE 1 TABLET TWICE A DAY 180 tablet 3  . pantoprazole (PROTONIX) 40 MG tablet Take 1 tablet (40 mg total) by mouth daily. 90 tablet 1  . potassium chloride (MICRO-K) 10 MEQ CR capsule Take 1 capsule (10 mEq total) by mouth 2 (two) times daily. (Patient  taking differently: Take 10 mEq by mouth 2 (two) times daily. Pt taking 2 - 10 mEq tablets 2 times a day. Total dose 40 mEq daily.) 180 capsule 0  . ciprofloxacin (CIPRO) 500 MG tablet Take 1 tablet (500 mg total) by mouth 2 (two) times daily. (Patient not taking: Reported on 01/21/2015) 10 tablet 0  . loratadine-pseudoephedrine (CLARITIN-D 12 HOUR) 5-120 MG per tablet Take 1 tablet by mouth as needed for allergies. (Patient not taking: Reported on 01/28/2015) 30 tablet 3  . [DISCONTINUED] mometasone (NASONEX) 50 MCG/ACT nasal spray Place 2 sprays into the nose daily. 17 g 2   No current facility-administered medications for this visit.    OBJECTIVE: Middle-aged African-American woman in no acute distress Filed Vitals:   01/28/15 1026  BP: 126/69  Pulse: 98  Temp: 98.2 F (36.8 C)  Resp: 18     Body mass index is 34.49 kg/(m^2).    ECOG FS:0 - Asymptomatic   Skin:  warm, dry  HEENT: sclerae anicteric, conjunctivae pink, oropharynx clear. No thrush or mucositis.  Lymph Nodes: No cervical or supraclavicular lymphadenopathy  Lungs: clear to auscultation bilaterally, no rales, wheezes, or rhonci  Heart: regular rate and rhythm  Abdomen: round, soft, non tender, positive bowel sounds  Musculoskeletal: No focal spinal tenderness, no peripheral edema  Neuro: non focal, well oriented, positive affect  Breasts: deferred  LAB RESULTS:  CMP     Component Value Date/Time   NA 140 01/28/2015 1002   NA 133* 12/31/2014 1102   K 3.9 01/28/2015 1002   K 2.7* 12/31/2014 1102   CL 97 12/31/2014 1102   CO2 24 01/28/2015 1002   CO2 26 12/31/2014 1102   GLUCOSE 140 01/28/2015 1002   GLUCOSE 129* 12/31/2014 1102   BUN 8.3 01/28/2015 1002   BUN 9 12/31/2014 1102   CREATININE 0.7 01/28/2015 1002   CREATININE 0.82 12/31/2014 1102   CALCIUM 9.4 01/28/2015 1002   CALCIUM 9.0 12/31/2014 1102   PROT 6.4 01/28/2015 1002   PROT 7.3 12/31/2014 1102   ALBUMIN 3.6 01/28/2015 1002   ALBUMIN 4.0 12/31/2014 1102   AST 19 01/28/2015 1002   AST 17 12/31/2014 1102   ALT 22 01/28/2015 1002   ALT 13 12/31/2014 1102   ALKPHOS 76 01/28/2015 1002   ALKPHOS 101 12/31/2014 1102   BILITOT 0.34 01/28/2015 1002   BILITOT 0.4 12/31/2014 1102   GFRNONAA 81* 12/31/2014 1102   GFRAA >90 12/31/2014 1102    INo results found for: SPEP, UPEP  Lab Results  Component Value Date   WBC 6.8 01/28/2015   NEUTROABS 4.9 01/28/2015   HGB 10.2* 01/28/2015   HCT 30.5* 01/28/2015   MCV 86.9 01/28/2015   PLT 360 01/28/2015      Chemistry      Component Value Date/Time   NA 140 01/28/2015 1002   NA 133* 12/31/2014 1102   K 3.9 01/28/2015 1002   K 2.7* 12/31/2014 1102   CL 97 12/31/2014 1102   CO2 24 01/28/2015 1002   CO2 26 12/31/2014 1102   BUN 8.3 01/28/2015 1002   BUN 9 12/31/2014 1102   CREATININE 0.7 01/28/2015 1002   CREATININE 0.82 12/31/2014 1102      Component Value  Date/Time   CALCIUM 9.4 01/28/2015 1002   CALCIUM 9.0 12/31/2014 1102   ALKPHOS 76 01/28/2015 1002   ALKPHOS 101 12/31/2014 1102   AST 19 01/28/2015 1002   AST 17 12/31/2014 1102   ALT 22 01/28/2015 1002  ALT 13 12/31/2014 1102   BILITOT 0.34 01/28/2015 1002   BILITOT 0.4 12/31/2014 1102       No results found for: LABCA2  No components found for: LABCA125  No results for input(s): INR in the last 168 hours.  Urinalysis    Component Value Date/Time   COLORURINE YELLOW 01/14/2014 1206   APPEARANCEUR CLEAR 01/14/2014 1206   LABSPEC <=1.005* 01/14/2014 1206   PHURINE 5.5 01/14/2014 1206   GLUCOSEU NEGATIVE 01/14/2014 1206   HGBUR NEGATIVE 01/14/2014 1206   HGBUR negative 08/19/2010 1041   BILIRUBINUR NEGATIVE 01/14/2014 1206   KETONESUR NEGATIVE 01/14/2014 1206   UROBILINOGEN 0.2 01/14/2014 1206   NITRITE NEGATIVE 01/14/2014 1206   LEUKOCYTESUR NEGATIVE 01/14/2014 1206    STUDIES: Dg Chest 2 View  12/31/2014   CLINICAL DATA:  Recent diagnosis of breast carcinoma. Fever of unknown origin.  EXAM: CHEST  2 VIEW  COMPARISON:  November 06, 2014  FINDINGS: Port-A-Cath tip is in the superior vena cava. No pneumothorax. Lungs are clear. Heart size and pulmonary vascularity are normal. No adenopathy. No bone lesions.  IMPRESSION: No edema or consolidation.  No mass or adenopathy appreciable.   Electronically Signed   By: Lowella Grip III M.D.   On: 12/31/2014 17:00   Mr Breast Bilateral W Wo Contrast  01/05/2015   CLINICAL DATA:  54 year old female with grade 3, triple negative, right breast invasive ductal carcinoma diagnosed in January, 2016. Evaluate response to neoadjuvant therapy.  LABS:  Not applicable.  EXAM: BILATERAL BREAST MRI WITH AND WITHOUT CONTRAST  TECHNIQUE: Multiplanar, multisequence MR images of both breasts were obtained prior to and following the intravenous administration of 20 ml of MultiHance  THREE-DIMENSIONAL MR IMAGE RENDERING ON INDEPENDENT WORKSTATION:   Three-dimensional MR images were rendered by post-processing of the original MR data on an independent workstation. The three-dimensional MR images were interpreted, and findings are reported in the following complete MRI report for this study. Three dimensional images were evaluated at the independent DynaCad workstation  COMPARISON:  10/26/2014 breast MRI.  FINDINGS: Breast composition: c.  Heterogeneous fibroglandular tissue.  Background parenchymal enhancement: Moderate.  Right breast: The irregular enhancing mass with central clip artifact located within the upper outer quadrant of the right breast corresponding to the known invasive ductal carcinoma has mildly decreased in size and now measures 1.7 x 1.5 x 1.5 cm. This previously measured 2.5 x 2.2 x 2.1 cm in size. The smaller mass previously seen located 5 mm anterior superior the mass measuring 1.4 cm in size is not visible today. In addition, the previously seen 6.5 cm area of non mass enhancement seen within the upper-outer quadrant of the right breast is no longer present today. There are no additional findings.  Left breast: No mass or abnormal enhancement.  Lymph nodes: The previously seen mildly prominent, cortically thickened right axillary lymph nodes appear less prominent.  Ancillary findings:  None.  IMPRESSION: 1. Decrease in size of the previously biopsied mass located within the upper outer quadrant of the right breast. This now measures 1.7 cm in size and previously measured 2.5 cm in size. 2. Resolution of the smaller more anteriorly and superiorly located enhancing mass and resolution of the previously seen non mass enhancement within the upper-outer quadrant of the right breast. 3. The previously prominent right axillary lymph nodes appear less prominent on today's study.  RECOMMENDATION: Treatment plan.  BI-RADS CATEGORY  6: Known biopsy-proven malignancy.   Electronically Signed   By: Altamese Cabal M.D.  On: 01/05/2015 10:51     ASSESSMENT: 54 y.o. 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   2) neoadjuvant chemotherapy to consist of cyclophosphamide and doxorubicin in dose dense fashion 4 given with Neulasta support, completed 01/06/2015, followed by weekly carboplatin and paclitaxel 12  (3) definitive surgery to follow chemotherapy  (4) adjuvant radiation, if appropriate, to follow surgery  PLAN: [name] tolerated her first cycle of chemotherapy remarkably well. The labs were reviewed in detail and were entirely stable. She will proceed with day 1, cycle 2 of paclitaxel and carboplatin as planned today.   [name] will return in 1 week for labs, a follow up visit, and cycle 3 of treatment. She understands and agrees with this plan. She knows the goal of treatment in her case is cure. She has been encouraged to call with any issues that might arise before her next visit here.    Laurie Panda, NP   01/28/2015 10:52 AM

## 2015-01-28 NOTE — Progress Notes (Signed)
Patient requested to see me for questions.  Patient is trying to make healthier diet choices. She had questions about processed meats and "meat" at breakfast. Stressed importance of making gradual changes over time to healthier, plant based diet. Patient is appreciative of information. She has my contact information for further questions.

## 2015-02-01 ENCOUNTER — Telehealth: Payer: Self-pay | Admitting: *Deleted

## 2015-02-01 NOTE — Telephone Encounter (Signed)
Notified [t with md response...Johny Chess

## 2015-02-01 NOTE — Telephone Encounter (Signed)
Pt call and stated after she ate an salad on Friday her throat begin to feel itchy so she took a benadryl. On Saturday voice started leaving. Denies fever, pain, or cough. Wanting to know what she do for this laryngitis.Marland KitchenJohny [name]

## 2015-02-01 NOTE — Telephone Encounter (Signed)
Continue antihistamine as she is doing for itch and drainage. Increase fluids, voice rest and Tylenol as needed  No antibiotics or other medical intervention needed at this time

## 2015-02-02 ENCOUNTER — Telehealth: Payer: Self-pay | Admitting: Oncology

## 2015-02-02 NOTE — Telephone Encounter (Signed)
Spoke with patient and she is aware of her adrena 6/30 appointment

## 2015-02-04 ENCOUNTER — Other Ambulatory Visit: Payer: Self-pay | Admitting: Oncology

## 2015-02-04 ENCOUNTER — Ambulatory Visit (HOSPITAL_BASED_OUTPATIENT_CLINIC_OR_DEPARTMENT_OTHER): Payer: BC Managed Care – PPO | Admitting: Nurse Practitioner

## 2015-02-04 ENCOUNTER — Other Ambulatory Visit (HOSPITAL_BASED_OUTPATIENT_CLINIC_OR_DEPARTMENT_OTHER): Payer: BC Managed Care – PPO

## 2015-02-04 ENCOUNTER — Ambulatory Visit (HOSPITAL_BASED_OUTPATIENT_CLINIC_OR_DEPARTMENT_OTHER): Payer: BC Managed Care – PPO

## 2015-02-04 ENCOUNTER — Encounter: Payer: Self-pay | Admitting: Nurse Practitioner

## 2015-02-04 ENCOUNTER — Telehealth: Payer: Self-pay | Admitting: *Deleted

## 2015-02-04 VITALS — BP 123/71 | HR 92 | Temp 98.3°F | Resp 20 | Wt 214.6 lb

## 2015-02-04 DIAGNOSIS — I10 Essential (primary) hypertension: Secondary | ICD-10-CM

## 2015-02-04 DIAGNOSIS — G35 Multiple sclerosis: Secondary | ICD-10-CM

## 2015-02-04 DIAGNOSIS — C50411 Malignant neoplasm of upper-outer quadrant of right female breast: Secondary | ICD-10-CM

## 2015-02-04 DIAGNOSIS — C50811 Malignant neoplasm of overlapping sites of right female breast: Secondary | ICD-10-CM

## 2015-02-04 DIAGNOSIS — Z5111 Encounter for antineoplastic chemotherapy: Secondary | ICD-10-CM | POA: Diagnosis not present

## 2015-02-04 LAB — COMPREHENSIVE METABOLIC PANEL (CC13)
ALBUMIN: 3.6 g/dL (ref 3.5–5.0)
ALT: 21 U/L (ref 0–55)
AST: 20 U/L (ref 5–34)
Alkaline Phosphatase: 73 U/L (ref 40–150)
Anion Gap: 12 mEq/L — ABNORMAL HIGH (ref 3–11)
BUN: 10.1 mg/dL (ref 7.0–26.0)
CALCIUM: 9.1 mg/dL (ref 8.4–10.4)
CO2: 25 mEq/L (ref 22–29)
Chloride: 104 mEq/L (ref 98–109)
Creatinine: 0.7 mg/dL (ref 0.6–1.1)
EGFR: >90 mL/min/{1.73_m2} (ref >90)
Glucose: 110 mg/dl (ref 70–140)
Potassium: 3.3 mEq/L — ABNORMAL LOW (ref 3.5–5.1)
Sodium: 140 mEq/L (ref 136–145)
TOTAL PROTEIN: 6.3 g/dL — AB (ref 6.4–8.3)
Total Bilirubin: 0.47 mg/dL (ref 0.20–1.20)

## 2015-02-04 LAB — CBC WITH DIFFERENTIAL/PLATELET
BASO%: 1.1 % (ref 0.0–2.0)
Basophils Absolute: 0 10*3/uL (ref 0.0–0.1)
EOS ABS: 0.1 10*3/uL (ref 0.0–0.5)
EOS%: 2.8 % (ref 0.0–7.0)
HCT: 31 % — ABNORMAL LOW (ref 34.8–46.6)
HEMOGLOBIN: 10.3 g/dL — AB (ref 11.6–15.9)
LYMPH%: 21.3 % (ref 14.0–49.7)
MCH: 29.5 pg (ref 25.1–34.0)
MCHC: 33.3 g/dL (ref 31.5–36.0)
MCV: 88.7 fL (ref 79.5–101.0)
MONO#: 0.5 10*3/uL (ref 0.1–0.9)
MONO%: 12.4 % (ref 0.0–14.0)
NEUT%: 62.4 % (ref 38.4–76.8)
NEUTROS ABS: 2.8 10*3/uL (ref 1.5–6.5)
PLATELETS: 276 10*3/uL (ref 145–400)
RBC: 3.49 10*6/uL — AB (ref 3.70–5.45)
RDW: 20.7 % — ABNORMAL HIGH (ref 11.2–14.5)
WBC: 4.4 10*3/uL (ref 3.9–10.3)
lymph#: 0.9 10*3/uL (ref 0.9–3.3)

## 2015-02-04 MED ORDER — SODIUM CHLORIDE 0.9 % IV SOLN
300.0000 mg | Freq: Once | INTRAVENOUS | Status: AC
  Administered 2015-02-04: 300 mg via INTRAVENOUS
  Filled 2015-02-04: qty 30

## 2015-02-04 MED ORDER — DIPHENHYDRAMINE HCL 50 MG/ML IJ SOLN
25.0000 mg | Freq: Once | INTRAMUSCULAR | Status: AC
  Administered 2015-02-04: 25 mg via INTRAVENOUS

## 2015-02-04 MED ORDER — HEPARIN SOD (PORK) LOCK FLUSH 100 UNIT/ML IV SOLN
500.0000 [IU] | Freq: Once | INTRAVENOUS | Status: AC | PRN
  Administered 2015-02-04: 500 [IU]
  Filled 2015-02-04: qty 5

## 2015-02-04 MED ORDER — SODIUM CHLORIDE 0.9 % IV SOLN
Freq: Once | INTRAVENOUS | Status: AC
  Administered 2015-02-04: 10:00:00 via INTRAVENOUS

## 2015-02-04 MED ORDER — SODIUM CHLORIDE 0.9 % IV SOLN
Freq: Once | INTRAVENOUS | Status: AC
  Administered 2015-02-04: 10:00:00 via INTRAVENOUS
  Filled 2015-02-04: qty 8

## 2015-02-04 MED ORDER — FAMOTIDINE IN NACL 20-0.9 MG/50ML-% IV SOLN
INTRAVENOUS | Status: AC
  Filled 2015-02-04: qty 50

## 2015-02-04 MED ORDER — FAMOTIDINE IN NACL 20-0.9 MG/50ML-% IV SOLN
20.0000 mg | Freq: Once | INTRAVENOUS | Status: AC
  Administered 2015-02-04: 20 mg via INTRAVENOUS

## 2015-02-04 MED ORDER — DIPHENHYDRAMINE HCL 50 MG/ML IJ SOLN
INTRAMUSCULAR | Status: AC
  Filled 2015-02-04: qty 1

## 2015-02-04 MED ORDER — PACLITAXEL CHEMO INJECTION 300 MG/50ML
80.0000 mg/m2 | Freq: Once | INTRAVENOUS | Status: AC
  Administered 2015-02-04: 168 mg via INTRAVENOUS
  Filled 2015-02-04: qty 28

## 2015-02-04 MED ORDER — SODIUM CHLORIDE 0.9 % IJ SOLN
10.0000 mL | INTRAMUSCULAR | Status: DC | PRN
  Administered 2015-02-04: 10 mL
  Filled 2015-02-04: qty 10

## 2015-02-04 NOTE — Telephone Encounter (Signed)
Per staff message and POF I have scheduled appts. Advised scheduler of appts. JMW  

## 2015-02-04 NOTE — Patient Instructions (Signed)
Carboplatin injection  What is this medicine?  CARBOPLATIN (KAR boe pla tin) is a chemotherapy drug. It targets fast dividing cells, like cancer cells, and causes these cells to die. This medicine is used to treat ovarian cancer and many other cancers.  This medicine may be used for other purposes; ask your health care provider or pharmacist if you have questions.  COMMON BRAND NAME(S): Paraplatin  What should I tell my health care provider before I take this medicine?  They need to know if you have any of these conditions:  -blood disorders  -hearing problems  -kidney disease  -recent or ongoing radiation therapy  -an unusual or allergic reaction to carboplatin, cisplatin, other chemotherapy, other medicines, foods, dyes, or preservatives  -pregnant or trying to get pregnant  -breast-feeding  How should I use this medicine?  This drug is usually given as an infusion into a vein. It is administered in a hospital or clinic by a specially trained health care professional.  Talk to your pediatrician regarding the use of this medicine in children. Special care may be needed.  Overdosage: If you think you have taken too much of this medicine contact a poison control center or emergency room at once.  NOTE: This medicine is only for you. Do not share this medicine with others.  What if I miss a dose?  It is important not to miss a dose. Call your doctor or health care professional if you are unable to keep an appointment.  What may interact with this medicine?  -medicines for seizures  -medicines to increase blood counts like filgrastim, pegfilgrastim, sargramostim  -some antibiotics like amikacin, gentamicin, neomycin, streptomycin, tobramycin  -vaccines  Talk to your doctor or health care professional before taking any of these medicines:  -acetaminophen  -aspirin  -ibuprofen  -ketoprofen  -naproxen  This list may not describe all possible interactions. Give your health care provider a list of all the medicines, herbs,  non-prescription drugs, or dietary supplements you use. Also tell them if you smoke, drink alcohol, or use illegal drugs. Some items may interact with your medicine.  What should I watch for while using this medicine?  Your condition will be monitored carefully while you are receiving this medicine. You will need important blood work done while you are taking this medicine.  This drug may make you feel generally unwell. This is not uncommon, as chemotherapy can affect healthy cells as well as cancer cells. Report any side effects. Continue your course of treatment even though you feel ill unless your doctor tells you to stop.  In some cases, you may be given additional medicines to help with side effects. Follow all directions for their use.  Call your doctor or health care professional for advice if you get a fever, chills or sore throat, or other symptoms of a cold or flu. Do not treat yourself. This drug decreases your body's ability to fight infections. Try to avoid being around people who are sick.  This medicine may increase your risk to bruise or bleed. Call your doctor or health care professional if you notice any unusual bleeding.  Be careful brushing and flossing your teeth or using a toothpick because you may get an infection or bleed more easily. If you have any dental work done, tell your dentist you are receiving this medicine.  Avoid taking products that contain aspirin, acetaminophen, ibuprofen, naproxen, or ketoprofen unless instructed by your doctor. These medicines may hide a fever.  Do not become   pregnant while taking this medicine. Women should inform their doctor if they wish to become pregnant or think they might be pregnant. There is a potential for serious side effects to an unborn child. Talk to your health care professional or pharmacist for more information. Do not breast-feed an infant while taking this medicine.  What side effects may I notice from receiving this medicine?  Side effects  that you should report to your doctor or health care professional as soon as possible:  -allergic reactions like skin rash, itching or hives, swelling of the face, lips, or tongue  -signs of infection - fever or chills, cough, sore throat, pain or difficulty passing urine  -signs of decreased platelets or bleeding - bruising, pinpoint red spots on the skin, black, tarry stools, nosebleeds  -signs of decreased red blood cells - unusually weak or tired, fainting spells, lightheadedness  -breathing problems  -changes in hearing  -changes in vision  -chest pain  -high blood pressure  -low blood counts - This drug may decrease the number of white blood cells, red blood cells and platelets. You may be at increased risk for infections and bleeding.  -nausea and vomiting  -pain, swelling, redness or irritation at the injection site  -pain, tingling, numbness in the hands or feet  -problems with balance, talking, walking  -trouble passing urine or change in the amount of urine  Side effects that usually do not require medical attention (report to your doctor or health care professional if they continue or are bothersome):  -hair loss  -loss of appetite  -metallic taste in the mouth or changes in taste  This list may not describe all possible side effects. Call your doctor for medical advice about side effects. You may report side effects to FDA at 1-800-FDA-1088.  Where should I keep my medicine?  This drug is given in a hospital or clinic and will not be stored at home.  NOTE: This sheet is a summary. It may not cover all possible information. If you have questions about this medicine, talk to your doctor, pharmacist, or health care provider.   2015, [employer]. (2007-12-10 14:38:05)  Paclitaxel injection  What is this medicine?  PACLITAXEL (PAK li TAX el) is a chemotherapy drug. It targets fast dividing cells, like cancer cells, and causes these cells to die. This medicine is used to treat ovarian cancer,  breast cancer, and other cancers.  This medicine may be used for other purposes; ask your health care provider or pharmacist if you have questions.  COMMON BRAND NAME(S): Onxol, Taxol  What should I tell my health care provider before I take this medicine?  They need to know if you have any of these conditions:  -blood disorders  -irregular heartbeat  -infection (especially a virus infection such as chickenpox, cold sores, or herpes)  -liver disease  -previous or ongoing radiation therapy  -an unusual or allergic reaction to paclitaxel, alcohol, polyoxyethylated castor oil, other chemotherapy agents, other medicines, foods, dyes, or preservatives  -pregnant or trying to get pregnant  -breast-feeding  How should I use this medicine?  This drug is given as an infusion into a vein. It is administered in a hospital or clinic by a specially trained health care professional.  Talk to your pediatrician regarding the use of this medicine in children. Special care may be needed.  Overdosage: If you think you have taken too much of this medicine contact a poison control center or emergency room at once.  NOTE:   This medicine is only for you. Do not share this medicine with others.  What if I miss a dose?  It is important not to miss your dose. Call your doctor or health care professional if you are unable to keep an appointment.  What may interact with this medicine?  Do not take this medicine with any of the following medications:  -disulfiram  -metronidazole  This medicine may also interact with the following medications:  -cyclosporine  -diazepam  -ketoconazole  -medicines to increase blood counts like filgrastim, pegfilgrastim, sargramostim  -other chemotherapy drugs like cisplatin, doxorubicin, epirubicin, etoposide, teniposide, vincristine  -quinidine  -testosterone  -vaccines  -verapamil  Talk to your doctor or health care professional before taking any of these  medicines:  -acetaminophen  -aspirin  -ibuprofen  -ketoprofen  -naproxen  This list may not describe all possible interactions. Give your health care provider a list of all the medicines, herbs, non-prescription drugs, or dietary supplements you use. Also tell them if you smoke, drink alcohol, or use illegal drugs. Some items may interact with your medicine.  What should I watch for while using this medicine?  Your condition will be monitored carefully while you are receiving this medicine. You will need important blood work done while you are taking this medicine.  This drug may make you feel generally unwell. This is not uncommon, as chemotherapy can affect healthy cells as well as cancer cells. Report any side effects. Continue your course of treatment even though you feel ill unless your doctor tells you to stop.  In some cases, you may be given additional medicines to help with side effects. Follow all directions for their use.  Call your doctor or health care professional for advice if you get a fever, chills or sore throat, or other symptoms of a cold or flu. Do not treat yourself. This drug decreases your body's ability to fight infections. Try to avoid being around people who are sick.  This medicine may increase your risk to bruise or bleed. Call your doctor or health care professional if you notice any unusual bleeding.  Be careful brushing and flossing your teeth or using a toothpick because you may get an infection or bleed more easily. If you have any dental work done, tell your dentist you are receiving this medicine.  Avoid taking products that contain aspirin, acetaminophen, ibuprofen, naproxen, or ketoprofen unless instructed by your doctor. These medicines may hide a fever.  Do not become pregnant while taking this medicine. Women should inform their doctor if they wish to become pregnant or think they might be pregnant. There is a potential for serious side effects to an unborn child. Talk to  your health care professional or pharmacist for more information. Do not breast-feed an infant while taking this medicine.  Men are advised not to father a child while receiving this medicine.  What side effects may I notice from receiving this medicine?  Side effects that you should report to your doctor or health care professional as soon as possible:  -allergic reactions like skin rash, itching or hives, swelling of the face, lips, or tongue  -low blood counts - This drug may decrease the number of white blood cells, red blood cells and platelets. You may be at increased risk for infections and bleeding.  -signs of infection - fever or chills, cough, sore throat, pain or difficulty passing urine  -signs of decreased platelets or bleeding - bruising, pinpoint red spots on the skin, black, tarry   stools, nosebleeds  -signs of decreased red blood cells - unusually weak or tired, fainting spells, lightheadedness  -breathing problems  -chest pain  -high or low blood pressure  -mouth sores  -nausea and vomiting  -pain, swelling, redness or irritation at the injection site  -pain, tingling, numbness in the hands or feet  -slow or irregular heartbeat  -swelling of the ankle, feet, hands  Side effects that usually do not require medical attention (report to your doctor or health care professional if they continue or are bothersome):  -bone pain  -complete hair loss including hair on your head, underarms, pubic hair, eyebrows, and eyelashes  -changes in the color of fingernails  -diarrhea  -loosening of the fingernails  -loss of appetite  -muscle or joint pain  -red flush to skin  -sweating  This list may not describe all possible side effects. Call your doctor for medical advice about side effects. You may report side effects to FDA at 1-800-FDA-1088.  Where should I keep my medicine?  This drug is given in a hospital or clinic and will not be stored at home.  NOTE: This sheet is a summary. It may not cover all possible  information. If you have questions about this medicine, talk to your doctor, pharmacist, or health care provider.   2015, [employer]. (2012-10-28 16:41:21)

## 2015-02-04 NOTE — Progress Notes (Signed)
[facility]  [phone] Fax:([phone]) [phone]     ID: [name] DOB: [birth year]  MR#: [id number]  JYN#:[id number]  Patient Care Team: [name], MD as PCP - General [name], MD as Consulting Physician (Obstetrics and Gynecology) [name], MD (Gastroenterology) [name]. [name], MD (Neurology) [name], MD as Consulting Physician (General Surgery) [name], MD as Consulting Physician (Oncology) [name], MD as Attending Physician (Radiation Oncology) [name], RN as Registered Nurse [name], RN as Registered Nurse OTHER MD:  CHIEF COMPLAINT: Triple negative breast cancer  CURRENT TREATMENT: Neoadjuvant chemotherapy  BREAST CANCER HISTORY: From the original intake note:  [name] [name] had bilateral screening mammography at the [facility] 11/21/2013. Breast density was category C. Exam was unremarkable. In January 2016 however the patient palpated a change in her right breast laterally. She contacted her gynecologist, Dr. [name], and he set her up for a unilateral right diagnostic mammography with right breast ultrasonography at [facility] 10/15/2014. There was a focal irregular opacity in the lateral right breast associated with pleomorphic calcifications. This was palpable on exam as a firm non-mobile mass. Ultrasound confirmed a hypoechoic mass in the right breast at the 9:00 position 5 cm from the nipple, measuring 2.9 cm. Next to this mass was a normal-sized intramammary lymph node measuring 4 mm. In the right axilla there were 3 contiguous level I lymph nodes the largest measuring 11 mm.  On 10/20/2014 the patient underwent right core needle biopsy of the 9:00 breast mass, showing ([id number]) and invasive ductal carcinoma, grade 3, triple negative, with the HER-2/neu signals ratio of 1.26 and number per cell 2.15. The MIB-1 was 71%. Biopsy of the largest right axillary lymph node was negative. Dr.  Owens Shark the mammographer who performed a biopsy describes this is concordant.  Left breast mammography 10/26/2014 was negative, and bilateral breast MRI the same day confirmed, in the right breast, and upper outer quadrant mass measuring 2.5 cm, with a second mass measuring 1.4 cm slightly anterior and superior to it. In addition there was a total area of approximately 6.5 cm of non-masslike enhancement extending throughout the upper outer quadrant of the right breast. There was a cortically thickened right axillary lymph node which had been biopsied. There was also a mildly cortically thickened right subpectoral lymph node. There were no internal mammary or left axillary lymph nodes in the left breast was unremarkable.  The patient's subsequent history is as detailed below.  INTERVAL HISTORY: [name] [name] returns today for follow up of her breast cancer. Today is day 1, cycle 3 of 12 of paclitaxel and carboplatin. Over the weekend she suggest she had "a bout of laryngitis," but has recovered fully and is speaking clearly today. She has gained 2lbs and wants to go on a diet. The interval history is otherwise unremarkable.  REVIEW OF SYSTEMS: [name] [name] denies fevers, chills, nausea, vomiting, or changes in bowel or bladder habits. She is eating and drinking well. She has no mouth sores, rashes, or neuropathy symptoms. She has no shortness of breath, chest pain, cough, or palpitations. She has good energy, and sleeps well at night. A detailed review of systems is otherwise stable.   PAST MEDICAL HISTORY: Past Medical History  Diagnosis Date  . METABOLIC SYNDROME X   . OBESITY, TRUNCAL   . CONSTIPATION, CHRONIC   . KELOID   . Multiple sclerosis   . Allergic rhinitis due to other allergen   . HYPERTENSION   .  HYPERLIPIDEMIA   . GERD   . Angioedema     ACEI  . Colon polyps 05/2014    adenomatous, colo q 5y  . Migraine headache   . Allergy   . Anxiety   . Breast cancer of upper-outer quadrant of right  female breast 10/23/2014  . Hot flashes     PAST SURGICAL HISTORY: Past Surgical History  Procedure Laterality Date  . Tubal ligation    . Colonoscopy    . Portacath placement Left 11/06/2014    Procedure: INSERTION PORT-A-CATH;  Surgeon: [name], MD;  Location: [facility];  Service: General;  Laterality: Left;    FAMILY HISTORY Family History  Problem Relation Age of Onset  . Coronary artery disease Mother   . Heart disease Mother   . Diabetes Mother   . Hypertension Other   . Hyperlipidemia Other   . Diabetes Other   . Colon cancer Neg Hx   . Alcohol abuse Father   . COPD Maternal Grandmother    the patient's father died at the age of 85 following his seizure. The patient's mother died from congestive 53 failure at the age of 67. The patient had 5 brothers and 2 sisters. One brother died from Escherichia coli infection, the other one had a history of seizure disorder. One sister died from causes unknown to the patient. There is no history of breast or ovarian cancer in the family.  GYNECOLOGIC HISTORY:  Patient's last menstrual period was 11/01/2012. Menarche age 8, first live birth age 63. The patient is GX P2. She stopped having periods in May 2014. She did not take hormone replacement. She did take her control pills remotely for approximately 4 years with no complications.  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 Her daughter [name] [name] lives in [place] and works in Ambulance person for [employer]. Son [name] [name] is an Engineer, building services for [place]: Not in place   HEALTH MAINTENANCE: History  Substance Use Topics  . Smoking status: Never Smoker   . Smokeless tobacco: Never Used     Comment: Single- brother staying with pt since mom's death  . Alcohol Use: No     Colonoscopy: September  2015/[name]  PAP: February 2015  Bone density:  Lipid panel:  Allergies  Allergen Reactions  . Ace Inhibitors Anaphylaxis    REACTION: Angioedema (tongue swelling)  . Clarithromycin Anaphylaxis    Throat swells  . Levaquin [Levofloxacin] Other (See Comments)    Tendon pain - shoulder and calf    Current Outpatient Prescriptions  Medication Sig Dispense Refill  . Glatiramer Acetate (COPAXONE) 40 MG/ML SOSY Inject 40 mg into the skin 3 (three) times a week. 12 Syringe 0  . hydrochlorothiazide (HYDRODIURIL) 25 MG tablet TAKE 1 TABLET DAILY 90 tablet 3  . lidocaine-prilocaine (EMLA) cream Apply to affected area once 30 g 3  . losartan (COZAAR) 50 MG tablet Take 1 tablet (50 mg total) by mouth 2 (two) times daily. 180 tablet 3  . metoprolol (LOPRESSOR) 50 MG tablet TAKE 1 TABLET TWICE A DAY 180 tablet 3  . pantoprazole (PROTONIX) 40 MG tablet Take 1 tablet (40 mg total) by mouth daily. 90 tablet 1  . potassium chloride (MICRO-K) 10 MEQ CR capsule Take 1 capsule (10 mEq total) by mouth 2 (two) times daily. (Patient taking differently: Take 10  mEq by mouth 2 (two) times daily. Pt taking 2 - 10 mEq tablets 2 times a day. Total dose 40 mEq daily.) 180 capsule 0  . ALPRAZolam (XANAX) 0.5 MG tablet Take 1 tablet (0.5 mg total) by mouth 3 (three) times daily as needed for anxiety or sleep. (Patient not taking: Reported on 02/04/2015) 30 tablet 1  . loratadine-pseudoephedrine (CLARITIN-D 12 HOUR) 5-120 MG per tablet Take 1 tablet by mouth as needed for allergies. (Patient not taking: Reported on 01/28/2015) 30 tablet 3  . [DISCONTINUED] mometasone (NASONEX) 50 MCG/ACT nasal spray Place 2 sprays into the nose daily. 17 g 2   No current facility-administered medications for this visit.    OBJECTIVE: Middle-aged African-American woman in no acute distress Filed Vitals:   02/04/15 0834  BP: 123/71  [name]: 92  Temp: 98.3 F (36.8 C)  Resp: 20     Body mass index is 34.65 kg/(m^2).    ECOG FS:0  - Asymptomatic   Sclerae unicteric, pupils equal and reactive Oropharynx clear and moist-- no thrush No cervical or supraclavicular adenopathy Lungs no rales or rhonchi Heart regular rate and rhythm Abd soft, nontender, positive bowel sounds MSK no focal spinal tenderness, no upper extremity lymphedema Neuro: nonfocal, well oriented, appropriate affect Breasts:deferred  LAB RESULTS:  CMP     Component Value Date/Time   NA 140 01/28/2015 1002   NA 133* 12/31/2014 1102   K 3.9 01/28/2015 1002   K 2.7* 12/31/2014 1102   CL 97 12/31/2014 1102   CO2 24 01/28/2015 1002   CO2 26 12/31/2014 1102   GLUCOSE 140 01/28/2015 1002   GLUCOSE 129* 12/31/2014 1102   BUN 8.3 01/28/2015 1002   BUN 9 12/31/2014 1102   CREATININE 0.7 01/28/2015 1002   CREATININE 0.82 12/31/2014 1102   CALCIUM 9.4 01/28/2015 1002   CALCIUM 9.0 12/31/2014 1102   PROT 6.4 01/28/2015 1002   PROT 7.3 12/31/2014 1102   ALBUMIN 3.6 01/28/2015 1002   ALBUMIN 4.0 12/31/2014 1102   AST 19 01/28/2015 1002   AST 17 12/31/2014 1102   ALT 22 01/28/2015 1002   ALT 13 12/31/2014 1102   ALKPHOS 76 01/28/2015 1002   ALKPHOS 101 12/31/2014 1102   BILITOT 0.34 01/28/2015 1002   BILITOT 0.4 12/31/2014 1102   GFRNONAA 81* 12/31/2014 1102   GFRAA >90 12/31/2014 1102    INo results found for: SPEP, UPEP  Lab Results  Component Value Date   WBC 4.4 02/04/2015   NEUTROABS 2.8 02/04/2015   HGB 10.3* 02/04/2015   HCT 31.0* 02/04/2015   MCV 88.7 02/04/2015   PLT 276 02/04/2015      Chemistry      Component Value Date/Time   NA 140 01/28/2015 1002   NA 133* 12/31/2014 1102   K 3.9 01/28/2015 1002   K 2.7* 12/31/2014 1102   CL 97 12/31/2014 1102   CO2 24 01/28/2015 1002   CO2 26 12/31/2014 1102   BUN 8.3 01/28/2015 1002   BUN 9 12/31/2014 1102   CREATININE 0.7 01/28/2015 1002   CREATININE 0.82 12/31/2014 1102      Component Value Date/Time   CALCIUM 9.4 01/28/2015 1002   CALCIUM 9.0 12/31/2014 1102    ALKPHOS 76 01/28/2015 1002   ALKPHOS 101 12/31/2014 1102   AST 19 01/28/2015 1002   AST 17 12/31/2014 1102   ALT 22 01/28/2015 1002   ALT 13 12/31/2014 1102   BILITOT 0.34 01/28/2015 1002   BILITOT 0.4 12/31/2014 1102  No results found for: LABCA2  No components found for: [id number]  No results for input(s): INR in the last 168 hours.  Urinalysis    Component Value Date/Time   COLORURINE YELLOW 01/14/2014 1206   APPEARANCEUR CLEAR 01/14/2014 1206   LABSPEC <=1.005* 01/14/2014 1206   PHURINE 5.5 01/14/2014 1206   GLUCOSEU NEGATIVE 01/14/2014 1206   HGBUR NEGATIVE 01/14/2014 1206   HGBUR negative 08/19/2010 1041   BILIRUBINUR NEGATIVE 01/14/2014 1206   KETONESUR NEGATIVE 01/14/2014 1206   UROBILINOGEN 0.2 01/14/2014 1206   NITRITE NEGATIVE 01/14/2014 1206   LEUKOCYTESUR NEGATIVE 01/14/2014 1206    STUDIES: Mr Breast Bilateral W Wo Contrast  01/05/2015   CLINICAL DATA:  54 year old female with grade 3, triple negative, right breast invasive ductal carcinoma diagnosed in January, 2016. Evaluate response to neoadjuvant therapy.  LABS:  Not applicable.  EXAM: BILATERAL BREAST MRI WITH AND WITHOUT CONTRAST  TECHNIQUE: Multiplanar, multisequence MR images of both breasts were obtained prior to and following the intravenous administration of 20 ml of MultiHance  THREE-DIMENSIONAL MR IMAGE RENDERING ON INDEPENDENT WORKSTATION:  Three-dimensional MR images were rendered by post-processing of the original MR data on an independent workstation. The three-dimensional MR images were interpreted, and findings are reported in the following complete MRI report for this study. Three dimensional images were evaluated at the independent DynaCad workstation  COMPARISON:  10/26/2014 breast MRI.  FINDINGS: Breast composition: c.  Heterogeneous fibroglandular tissue.  Background parenchymal enhancement: Moderate.  Right breast: The irregular enhancing mass with central clip artifact located within  the upper outer quadrant of the right breast corresponding to the known invasive ductal carcinoma has mildly decreased in size and now measures 1.7 x 1.5 x 1.5 cm. This previously measured 2.5 x 2.2 x 2.1 cm in size. The smaller mass previously seen located 5 mm anterior superior the mass measuring 1.4 cm in size is not visible today. In addition, the previously seen 6.5 cm area of non mass enhancement seen within the upper-outer quadrant of the right breast is no longer present today. There are no additional findings.  Left breast: No mass or abnormal enhancement.  Lymph nodes: The previously seen mildly prominent, cortically thickened right axillary lymph nodes appear less prominent.  Ancillary findings:  None.  IMPRESSION: 1. Decrease in size of the previously biopsied mass located within the upper outer quadrant of the right breast. This now measures 1.7 cm in size and previously measured 2.5 cm in size. 2. Resolution of the smaller more anteriorly and superiorly located enhancing mass and resolution of the previously seen non mass enhancement within the upper-outer quadrant of the right breast. 3. The previously prominent right axillary lymph nodes appear less prominent on today's study.  RECOMMENDATION: Treatment plan.  BI-RADS CATEGORY  6: Known biopsy-proven malignancy.   Electronically Signed   By: Altamese Cabal M.D.   On: 01/05/2015 10:51    ASSESSMENT: 54 y.o. [place] woman status post left breast upper outer quadrant biopsy 10/20/2014 for a clinical T2 N0, stage IIA invasive ductal carcinoma, grade 3, triple negative, with an MIB-1 of 71%  (a) right axillary lymph node biopsy negative 10/30/2014  (b) biopsy of posterior extent of right breast calcifications show ductal carcinoma is situ, high grade, with microinvasion  (1) genetics testing through the Breast High/Moderate Risk gene panel offered by [facility] found no deleterious mutations in ATM, BRCA1, BRCA2, CDH1, CHEK2, PALB2, PTEN, STK11,  or TP53.   2) neoadjuvant chemotherapy to consist of cyclophosphamide and doxorubicin in dose  dense fashion 4 given with Neulasta support, completed 01/06/2015, followed by weekly carboplatin and paclitaxel 12  (3) definitive surgery to follow chemotherapy  (4) adjuvant radiation, if appropriate, to follow surgery  PLAN: [name] [name] feels better each week. The labs were reviewed in detail and were entirely stable. She will proceed with day 1, cycle 3 of paclitaxel and carboplatin as planned today.   [name] [name] will return in 1 week for labs, a follow up visit, and cycle 4 of treatment. She understands and agrees with this plan. She knows the goal of treatment in her case is cure. She has been encouraged to call with any issues that might arise before her next visit here.    Laurie Panda, NP   02/04/2015 8:54 AM

## 2015-02-11 ENCOUNTER — Encounter: Payer: Self-pay | Admitting: Nurse Practitioner

## 2015-02-11 ENCOUNTER — Ambulatory Visit (HOSPITAL_BASED_OUTPATIENT_CLINIC_OR_DEPARTMENT_OTHER): Payer: BC Managed Care – PPO

## 2015-02-11 ENCOUNTER — Ambulatory Visit (HOSPITAL_BASED_OUTPATIENT_CLINIC_OR_DEPARTMENT_OTHER): Payer: BC Managed Care – PPO | Admitting: Nurse Practitioner

## 2015-02-11 ENCOUNTER — Other Ambulatory Visit (HOSPITAL_BASED_OUTPATIENT_CLINIC_OR_DEPARTMENT_OTHER): Payer: BC Managed Care – PPO

## 2015-02-11 VITALS — HR 97

## 2015-02-11 VITALS — BP 130/79 | HR 103 | Temp 98.6°F | Resp 18 | Ht 66.0 in | Wt 217.0 lb

## 2015-02-11 DIAGNOSIS — C50411 Malignant neoplasm of upper-outer quadrant of right female breast: Secondary | ICD-10-CM

## 2015-02-11 DIAGNOSIS — G35 Multiple sclerosis: Secondary | ICD-10-CM

## 2015-02-11 DIAGNOSIS — Z5111 Encounter for antineoplastic chemotherapy: Secondary | ICD-10-CM

## 2015-02-11 DIAGNOSIS — C50811 Malignant neoplasm of overlapping sites of right female breast: Secondary | ICD-10-CM

## 2015-02-11 DIAGNOSIS — Z171 Estrogen receptor negative status [ER-]: Secondary | ICD-10-CM | POA: Diagnosis not present

## 2015-02-11 DIAGNOSIS — I10 Essential (primary) hypertension: Secondary | ICD-10-CM

## 2015-02-11 LAB — CBC WITH DIFFERENTIAL/PLATELET
BASO%: 0.3 % (ref 0.0–2.0)
Basophils Absolute: 0 10*3/uL (ref 0.0–0.1)
EOS%: 1.6 % (ref 0.0–7.0)
Eosinophils Absolute: 0.1 10*3/uL (ref 0.0–0.5)
HCT: 30 % — ABNORMAL LOW (ref 34.8–46.6)
HGB: 10 g/dL — ABNORMAL LOW (ref 11.6–15.9)
LYMPH%: 23.3 % (ref 14.0–49.7)
MCH: 29.8 pg (ref 25.1–34.0)
MCHC: 33.3 g/dL (ref 31.5–36.0)
MCV: 89.3 fL (ref 79.5–101.0)
MONO#: 0.4 10*3/uL (ref 0.1–0.9)
MONO%: 9.7 % (ref 0.0–14.0)
NEUT%: 65.1 % (ref 38.4–76.8)
NEUTROS ABS: 2.5 10*3/uL (ref 1.5–6.5)
PLATELETS: 195 10*3/uL (ref 145–400)
RBC: 3.36 10*6/uL — ABNORMAL LOW (ref 3.70–5.45)
RDW: 20.4 % — AB (ref 11.2–14.5)
WBC: 3.8 10*3/uL — ABNORMAL LOW (ref 3.9–10.3)
lymph#: 0.9 10*3/uL (ref 0.9–3.3)
nRBC: 2 % — ABNORMAL HIGH (ref 0–0)

## 2015-02-11 LAB — COMPREHENSIVE METABOLIC PANEL (CC13)
ALT: 21 U/L (ref 0–55)
ANION GAP: 13 meq/L — AB (ref 3–11)
AST: 22 U/L (ref 5–34)
Albumin: 3.6 g/dL (ref 3.5–5.0)
Alkaline Phosphatase: 70 U/L (ref 40–150)
BUN: 7 mg/dL (ref 7.0–26.0)
CHLORIDE: 105 meq/L (ref 98–109)
CO2: 23 mEq/L (ref 22–29)
Calcium: 8.8 mg/dL (ref 8.4–10.4)
Creatinine: 0.7 mg/dL (ref 0.6–1.1)
EGFR: >90 mL/min/{1.73_m2} (ref >90)
GLUCOSE: 117 mg/dL (ref 70–140)
Potassium: 3.7 mEq/L (ref 3.5–5.1)
Sodium: 141 mEq/L (ref 136–145)
Total Bilirubin: 0.48 mg/dL (ref 0.20–1.20)
Total Protein: 6.3 g/dL — ABNORMAL LOW (ref 6.4–8.3)

## 2015-02-11 MED ORDER — SODIUM CHLORIDE 0.9 % IJ SOLN
10.0000 mL | INTRAMUSCULAR | Status: DC | PRN
  Administered 2015-02-11: 10 mL
  Filled 2015-02-11: qty 10

## 2015-02-11 MED ORDER — DEXTROSE 5 % IV SOLN
80.0000 mg/m2 | Freq: Once | INTRAVENOUS | Status: AC
  Administered 2015-02-11: 168 mg via INTRAVENOUS
  Filled 2015-02-11: qty 28

## 2015-02-11 MED ORDER — DIPHENHYDRAMINE HCL 50 MG/ML IJ SOLN
25.0000 mg | Freq: Once | INTRAMUSCULAR | Status: AC
  Administered 2015-02-11: 25 mg via INTRAVENOUS

## 2015-02-11 MED ORDER — SODIUM CHLORIDE 0.9 % IV SOLN
Freq: Once | INTRAVENOUS | Status: AC
  Administered 2015-02-11: 11:00:00 via INTRAVENOUS

## 2015-02-11 MED ORDER — HEPARIN SOD (PORK) LOCK FLUSH 100 UNIT/ML IV SOLN
500.0000 [IU] | Freq: Once | INTRAVENOUS | Status: AC | PRN
  Administered 2015-02-11: 500 [IU]
  Filled 2015-02-11: qty 5

## 2015-02-11 MED ORDER — SODIUM CHLORIDE 0.9 % IV SOLN
Freq: Once | INTRAVENOUS | Status: AC
  Administered 2015-02-11: 11:00:00 via INTRAVENOUS
  Filled 2015-02-11: qty 8

## 2015-02-11 MED ORDER — FAMOTIDINE IN NACL 20-0.9 MG/50ML-% IV SOLN
20.0000 mg | Freq: Once | INTRAVENOUS | Status: AC
  Administered 2015-02-11: 20 mg via INTRAVENOUS

## 2015-02-11 MED ORDER — DIPHENHYDRAMINE HCL 50 MG/ML IJ SOLN
INTRAMUSCULAR | Status: AC
  Filled 2015-02-11: qty 1

## 2015-02-11 MED ORDER — FAMOTIDINE IN NACL 20-0.9 MG/50ML-% IV SOLN
INTRAVENOUS | Status: AC
  Filled 2015-02-11: qty 50

## 2015-02-11 MED ORDER — SODIUM CHLORIDE 0.9 % IV SOLN
300.0000 mg | Freq: Once | INTRAVENOUS | Status: AC
  Administered 2015-02-11: 300 mg via INTRAVENOUS
  Filled 2015-02-11: qty 30

## 2015-02-11 NOTE — Patient Instructions (Signed)
[facility] Discharge Instructions for Patients Receiving Chemotherapy  Today you received the following chemotherapy agents Taxol and Carboplatin. To help prevent nausea and vomiting after your treatment, we encourage you to take your nausea medication as directed.  If you develop nausea and vomiting that is not controlled by your nausea medication, call the clinic.   BELOW ARE SYMPTOMS THAT SHOULD BE REPORTED IMMEDIATELY:  *FEVER GREATER THAN 100.5 F  *CHILLS WITH OR WITHOUT FEVER  NAUSEA AND VOMITING THAT IS NOT CONTROLLED WITH YOUR NAUSEA MEDICATION  *UNUSUAL SHORTNESS OF BREATH  *UNUSUAL BRUISING OR BLEEDING  TENDERNESS IN MOUTH AND THROAT WITH OR WITHOUT PRESENCE OF ULCERS  *URINARY PROBLEMS  *BOWEL PROBLEMS  UNUSUAL RASH Items with * indicate a potential emergency and should be followed up as soon as possible.  Feel free to call the clinic you have any questions or concerns. The clinic phone number is [phone].  Please show the CHEMO ALERT CARD at check-in to the Emergency Department and triage nurse.    

## 2015-02-11 NOTE — Progress Notes (Signed)
[facility]  [phone] Fax:([phone]) [phone]     ID: [name] [name] DOB: [birth year]  MR#: [id number]  OTR#:[id number]  Patient Care Team: [name], MD as PCP - General [name], MD as Consulting Physician (Obstetrics and Gynecology) [name], MD (Gastroenterology) [name]. [name], MD (Neurology) [name], MD as Consulting Physician (General Surgery) [name], MD as Consulting Physician (Oncology) [name], MD as Attending Physician (Radiation Oncology) [name], RN as Registered Nurse [name], RN as Registered Nurse OTHER MD:  CHIEF COMPLAINT: Triple negative breast cancer  CURRENT TREATMENT: Neoadjuvant chemotherapy  BREAST CANCER HISTORY: From the original intake note:  [name] [name] had bilateral screening mammography at the [facility] 11/21/2013. Breast density was category C. Exam was unremarkable. In January 2016 however the patient palpated a change in her right breast laterally. She contacted her gynecologist, Dr. [name], and he set her up for a unilateral right diagnostic mammography with right breast ultrasonography at [facility] 10/15/2014. There was a focal irregular opacity in the lateral right breast associated with pleomorphic calcifications. This was palpable on exam as a firm non-mobile mass. Ultrasound confirmed a hypoechoic mass in the right breast at the 9:00 position 5 cm from the nipple, measuring 2.9 cm. Next to this mass was a normal-sized intramammary lymph node measuring 4 mm. In the right axilla there were 3 contiguous level I lymph nodes the largest measuring 11 mm.  On 10/20/2014 the patient underwent right core needle biopsy of the 9:00 breast mass, showing ([id number]) and invasive ductal carcinoma, grade 3, triple negative, with the HER-2/neu signals ratio of 1.26 and number per cell 2.15. The MIB-1 was 71%. Biopsy of the largest right axillary lymph node was negative. Dr.  Owens Shark the mammographer who performed a biopsy describes this is concordant.  Left breast mammography 10/26/2014 was negative, and bilateral breast MRI the same day confirmed, in the right breast, and upper outer quadrant mass measuring 2.5 cm, with a second mass measuring 1.4 cm slightly anterior and superior to it. In addition there was a total area of approximately 6.5 cm of non-masslike enhancement extending throughout the upper outer quadrant of the right breast. There was a cortically thickened right axillary lymph node which had been biopsied. There was also a mildly cortically thickened right subpectoral lymph node. There were no internal mammary or left axillary lymph nodes in the left breast was unremarkable.  The patient's subsequent history is as detailed below.  INTERVAL HISTORY: [name] [name] returns today for follow up of her breast cancer. Today is day 1, cycle 4 of 12 of paclitaxel and carboplatin. She tolerates this chemotherapy well with no complaints or reactions. She has gained another 3lb since her visit last week, and this bothers her. She is not physically active, but knows she need to incorporate this regularly during the week.   REVIEW OF SYSTEMS: [name] [name] denies fevers, chills, nausea, vomiting, or changes in bowel or bladder habits. She is eating and drinking well. She has no mouth sores, rashes, or neuropathy symptoms. She has no shortness of breath, chest pain, cough, or palpitations. She has good energy, and sleeps well at night. A detailed review of systems is otherwise stable.   PAST MEDICAL HISTORY: Past Medical History  Diagnosis Date  . METABOLIC SYNDROME X   . OBESITY, TRUNCAL   . CONSTIPATION, CHRONIC   . KELOID   . Multiple sclerosis   . Allergic rhinitis due to other allergen   .  HYPERTENSION   . HYPERLIPIDEMIA   . GERD   . Angioedema     ACEI  . Colon polyps 05/2014    adenomatous, colo q 5y  . Migraine headache   . Allergy   . Anxiety   . Breast cancer of  upper-outer quadrant of right female breast 10/23/2014  . Hot flashes     PAST SURGICAL HISTORY: Past Surgical History  Procedure Laterality Date  . Tubal ligation    . Colonoscopy    . Portacath placement Left 11/06/2014    Procedure: INSERTION PORT-A-CATH;  Surgeon: [name], MD;  Location: [facility];  Service: General;  Laterality: Left;    FAMILY HISTORY Family History  Problem Relation Age of Onset  . Coronary artery disease Mother   . Heart disease Mother   . Diabetes Mother   . Hypertension Other   . Hyperlipidemia Other   . Diabetes Other   . Colon cancer Neg Hx   . Alcohol abuse Father   . COPD Maternal Grandmother    the patient's father died at the age of 54 following his seizure. The patient's mother died from congestive 18 failure at the age of 62. The patient had 5 brothers and 2 sisters. One brother died from Escherichia coli infection, the other one had a history of seizure disorder. One sister died from causes unknown to the patient. There is no history of breast or ovarian cancer in the family.  GYNECOLOGIC HISTORY:  Patient's last menstrual period was 11/01/2012. Menarche age 80, first live birth age 21. The patient is GX P2. She stopped having periods in May 2014. She did not take hormone replacement. She did take her control pills remotely for approximately 4 years with no complications.  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 Her daughter [name] [name] lives in [place] and works in Ambulance person for [employer]. Son [name] [name] is an Engineer, building services for [employer]: Not in place   HEALTH MAINTENANCE: History  Substance Use Topics  . Smoking status: Never Smoker   . Smokeless tobacco: Never Used     Comment: Single- brother staying with pt since mom's death  . Alcohol Use: No      Colonoscopy: September 2015/[name]  PAP: February 2015  Bone density:  Lipid panel:  Allergies  Allergen Reactions  . Ace Inhibitors Anaphylaxis    REACTION: Angioedema (tongue swelling)  . Clarithromycin Anaphylaxis    Throat swells  . Levaquin [Levofloxacin] Other (See Comments)    Tendon pain - shoulder and calf    Current Outpatient Prescriptions  Medication Sig Dispense Refill  . ALPRAZolam (XANAX) 0.5 MG tablet Take 1 tablet (0.5 mg total) by mouth 3 (three) times daily as needed for anxiety or sleep. (Patient not taking: Reported on 02/04/2015) 30 tablet 1  . Glatiramer Acetate (COPAXONE) 40 MG/ML SOSY Inject 40 mg into the skin 3 (three) times a week. 12 Syringe 0  . hydrochlorothiazide (HYDRODIURIL) 25 MG tablet TAKE 1 TABLET DAILY 90 tablet 3  . lidocaine-prilocaine (EMLA) cream Apply to affected area once 30 g 3  . loratadine-pseudoephedrine (CLARITIN-D 12 HOUR) 5-120 MG per tablet Take 1 tablet by mouth as needed for allergies. (Patient not taking: Reported on 01/28/2015) 30 tablet 3  . losartan (COZAAR) 50 MG tablet Take 1 tablet (50 mg total) by mouth 2 (two)  times daily. 180 tablet 3  . metoprolol (LOPRESSOR) 50 MG tablet TAKE 1 TABLET TWICE A DAY 180 tablet 3  . pantoprazole (PROTONIX) 40 MG tablet Take 1 tablet (40 mg total) by mouth daily. 90 tablet 1  . potassium chloride (MICRO-K) 10 MEQ CR capsule Take 1 capsule (10 mEq total) by mouth 2 (two) times daily. (Patient taking differently: Take 10 mEq by mouth 2 (two) times daily. Pt taking 2 - 10 mEq tablets 2 times a day. Total dose 40 mEq daily.) 180 capsule 0  . [DISCONTINUED] mometasone (NASONEX) 50 MCG/ACT nasal spray Place 2 sprays into the nose daily. 17 g 2   No current facility-administered medications for this visit.    OBJECTIVE: Middle-aged African-American woman in no acute distress Filed Vitals:   02/11/15 0857  BP: 130/79  [name]: 103  Temp: 98.6 F (37 C)  Resp: 18     Body mass index is  35.04 kg/(m^2).    ECOG FS:0 - Asymptomatic   Skin: warm, dry  HEENT: sclerae anicteric, conjunctivae pink, oropharynx clear. No thrush or mucositis.  Lymph Nodes: No cervical or supraclavicular lymphadenopathy  Lungs: clear to auscultation bilaterally, no rales, wheezes, or rhonci  Heart: regular rate and rhythm  Abdomen: round, soft, non tender, positive bowel sounds  Musculoskeletal: No focal spinal tenderness, no peripheral edema  Neuro: non focal, well oriented, positive affect  Breasts: deferred  LAB RESULTS:  CMP     Component Value Date/Time   NA 141 02/11/2015 0833   NA 133* 12/31/2014 1102   K 3.7 02/11/2015 0833   K 2.7* 12/31/2014 1102   CL 97 12/31/2014 1102   CO2 23 02/11/2015 0833   CO2 26 12/31/2014 1102   GLUCOSE 117 02/11/2015 0833   GLUCOSE 129* 12/31/2014 1102   BUN 7.0 02/11/2015 0833   BUN 9 12/31/2014 1102   CREATININE 0.7 02/11/2015 0833   CREATININE 0.82 12/31/2014 1102   CALCIUM 8.8 02/11/2015 0833   CALCIUM 9.0 12/31/2014 1102   PROT 6.3* 02/11/2015 0833   PROT 7.3 12/31/2014 1102   ALBUMIN 3.6 02/11/2015 0833   ALBUMIN 4.0 12/31/2014 1102   AST 22 02/11/2015 0833   AST 17 12/31/2014 1102   ALT 21 02/11/2015 0833   ALT 13 12/31/2014 1102   ALKPHOS 70 02/11/2015 0833   ALKPHOS 101 12/31/2014 1102   BILITOT 0.48 02/11/2015 0833   BILITOT 0.4 12/31/2014 1102   GFRNONAA 81* 12/31/2014 1102   GFRAA >90 12/31/2014 1102    INo results found for: SPEP, UPEP  Lab Results  Component Value Date   WBC 3.8* 02/11/2015   NEUTROABS 2.5 02/11/2015   HGB 10.0* 02/11/2015   HCT 30.0* 02/11/2015   MCV 89.3 02/11/2015   PLT 195 02/11/2015      Chemistry      Component Value Date/Time   NA 141 02/11/2015 0833   NA 133* 12/31/2014 1102   K 3.7 02/11/2015 0833   K 2.7* 12/31/2014 1102   CL 97 12/31/2014 1102   CO2 23 02/11/2015 0833   CO2 26 12/31/2014 1102   BUN 7.0 02/11/2015 0833   BUN 9 12/31/2014 1102   CREATININE 0.7 02/11/2015 0833    CREATININE 0.82 12/31/2014 1102      Component Value Date/Time   CALCIUM 8.8 02/11/2015 0833   CALCIUM 9.0 12/31/2014 1102   ALKPHOS 70 02/11/2015 0833   ALKPHOS 101 12/31/2014 1102   AST 22 02/11/2015 0833   AST 17 12/31/2014 1102  ALT 21 02/11/2015 0833   ALT 13 12/31/2014 1102   BILITOT 0.48 02/11/2015 0833   BILITOT 0.4 12/31/2014 1102       No results found for: LABCA2  No components found for: LABCA125  No results for input(s): INR in the last 168 hours.  Urinalysis    Component Value Date/Time   COLORURINE YELLOW 01/14/2014 1206   APPEARANCEUR CLEAR 01/14/2014 1206   LABSPEC <=1.005* 01/14/2014 1206   PHURINE 5.5 01/14/2014 1206   GLUCOSEU NEGATIVE 01/14/2014 1206   HGBUR NEGATIVE 01/14/2014 1206   HGBUR negative 08/19/2010 1041   BILIRUBINUR NEGATIVE 01/14/2014 1206   KETONESUR NEGATIVE 01/14/2014 1206   UROBILINOGEN 0.2 01/14/2014 1206   NITRITE NEGATIVE 01/14/2014 1206   LEUKOCYTESUR NEGATIVE 01/14/2014 1206    STUDIES: No results found.  ASSESSMENT: 54 y.o. 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   2) neoadjuvant chemotherapy to consist of cyclophosphamide and doxorubicin in dose dense fashion 4 given with Neulasta support, completed 01/06/2015, followed by weekly carboplatin and paclitaxel 12  (3) definitive surgery to follow chemotherapy  (4) adjuvant radiation, if appropriate, to follow surgery  PLAN: [name] [name] continues to manage treatment  remarkably well. The labs were reviewed in detail and were entirely stable. She will proceed with day 1, cycle 4 of paclitaxel and carboplatin as planned today.   [name] [name] will return in 1 week for labs, a follow up visit, and cycle 5 of treatment. She understands and agrees with this plan. She knows the goal of treatment in her case is cure. She has been encouraged to call with any issues that might arise before her next visit here.    Laurie Panda, NP   02/11/2015 9:36 AM

## 2015-02-18 ENCOUNTER — Other Ambulatory Visit (HOSPITAL_BASED_OUTPATIENT_CLINIC_OR_DEPARTMENT_OTHER): Payer: BC Managed Care – PPO

## 2015-02-18 ENCOUNTER — Encounter: Payer: Self-pay | Admitting: Nurse Practitioner

## 2015-02-18 ENCOUNTER — Telehealth: Payer: Self-pay | Admitting: Nurse Practitioner

## 2015-02-18 ENCOUNTER — Ambulatory Visit (HOSPITAL_BASED_OUTPATIENT_CLINIC_OR_DEPARTMENT_OTHER): Payer: BC Managed Care – PPO | Admitting: Nurse Practitioner

## 2015-02-18 ENCOUNTER — Ambulatory Visit (HOSPITAL_BASED_OUTPATIENT_CLINIC_OR_DEPARTMENT_OTHER): Payer: BC Managed Care – PPO

## 2015-02-18 VITALS — BP 117/59 | HR 99 | Temp 99.6°F | Resp 18 | Ht 66.0 in | Wt 216.6 lb

## 2015-02-18 DIAGNOSIS — C50411 Malignant neoplasm of upper-outer quadrant of right female breast: Secondary | ICD-10-CM

## 2015-02-18 DIAGNOSIS — I10 Essential (primary) hypertension: Secondary | ICD-10-CM

## 2015-02-18 DIAGNOSIS — C50811 Malignant neoplasm of overlapping sites of right female breast: Secondary | ICD-10-CM

## 2015-02-18 DIAGNOSIS — G35 Multiple sclerosis: Secondary | ICD-10-CM

## 2015-02-18 DIAGNOSIS — Z5111 Encounter for antineoplastic chemotherapy: Secondary | ICD-10-CM

## 2015-02-18 LAB — COMPREHENSIVE METABOLIC PANEL (CC13)
ALBUMIN: 3.7 g/dL (ref 3.5–5.0)
ALT: 21 U/L (ref 0–55)
AST: 21 U/L (ref 5–34)
Alkaline Phosphatase: 76 U/L (ref 40–150)
Anion Gap: 10 mEq/L (ref 3–11)
BUN: 9.7 mg/dL (ref 7.0–26.0)
CO2: 25 meq/L (ref 22–29)
Calcium: 9.3 mg/dL (ref 8.4–10.4)
Chloride: 104 mEq/L (ref 98–109)
Creatinine: 0.8 mg/dL (ref 0.6–1.1)
EGFR: >90 mL/min/{1.73_m2} (ref >90)
Glucose: 134 mg/dl (ref 70–140)
Potassium: 3.4 mEq/L — ABNORMAL LOW (ref 3.5–5.1)
Sodium: 139 mEq/L (ref 136–145)
TOTAL PROTEIN: 6.6 g/dL (ref 6.4–8.3)
Total Bilirubin: 0.74 mg/dL (ref 0.20–1.20)

## 2015-02-18 LAB — CBC WITH DIFFERENTIAL/PLATELET
BASO%: 0.3 % (ref 0.0–2.0)
BASOS ABS: 0 10*3/uL (ref 0.0–0.1)
EOS%: 1.4 % (ref 0.0–7.0)
Eosinophils Absolute: 0.1 10*3/uL (ref 0.0–0.5)
HCT: 28.3 % — ABNORMAL LOW (ref 34.8–46.6)
HGB: 9.6 g/dL — ABNORMAL LOW (ref 11.6–15.9)
LYMPH%: 41 % (ref 14.0–49.7)
MCH: 29.8 pg (ref 25.1–34.0)
MCHC: 33.9 g/dL (ref 31.5–36.0)
MCV: 87.9 fL (ref 79.5–101.0)
MONO#: 0.2 10*3/uL (ref 0.1–0.9)
MONO%: 6.6 % (ref 0.0–14.0)
NEUT#: 1.8 10*3/uL (ref 1.5–6.5)
NEUT%: 50.7 % (ref 38.4–76.8)
Platelets: 110 10*3/uL — ABNORMAL LOW (ref 145–400)
RBC: 3.22 10*6/uL — AB (ref 3.70–5.45)
RDW: 19.6 % — ABNORMAL HIGH (ref 11.2–14.5)
WBC: 3.6 10*3/uL — AB (ref 3.9–10.3)
lymph#: 1.5 10*3/uL (ref 0.9–3.3)

## 2015-02-18 MED ORDER — SODIUM CHLORIDE 0.9 % IV SOLN
Freq: Once | INTRAVENOUS | Status: AC
  Administered 2015-02-18: 15:00:00 via INTRAVENOUS

## 2015-02-18 MED ORDER — HEPARIN SOD (PORK) LOCK FLUSH 100 UNIT/ML IV SOLN
500.0000 [IU] | Freq: Once | INTRAVENOUS | Status: AC | PRN
  Administered 2015-02-18: 500 [IU]
  Filled 2015-02-18: qty 5

## 2015-02-18 MED ORDER — SODIUM CHLORIDE 0.9 % IV SOLN
Freq: Once | INTRAVENOUS | Status: AC
  Administered 2015-02-18: 15:00:00 via INTRAVENOUS
  Filled 2015-02-18: qty 8

## 2015-02-18 MED ORDER — SODIUM CHLORIDE 0.9 % IJ SOLN
10.0000 mL | INTRAMUSCULAR | Status: DC | PRN
  Administered 2015-02-18: 10 mL
  Filled 2015-02-18: qty 10

## 2015-02-18 MED ORDER — DIPHENHYDRAMINE HCL 50 MG/ML IJ SOLN
INTRAMUSCULAR | Status: AC
  Filled 2015-02-18: qty 1

## 2015-02-18 MED ORDER — FAMOTIDINE IN NACL 20-0.9 MG/50ML-% IV SOLN
20.0000 mg | Freq: Once | INTRAVENOUS | Status: AC
  Administered 2015-02-18: 20 mg via INTRAVENOUS

## 2015-02-18 MED ORDER — DIPHENHYDRAMINE HCL 50 MG/ML IJ SOLN
25.0000 mg | Freq: Once | INTRAMUSCULAR | Status: AC
  Administered 2015-02-18: 25 mg via INTRAVENOUS

## 2015-02-18 MED ORDER — SODIUM CHLORIDE 0.9 % IV SOLN
300.0000 mg | Freq: Once | INTRAVENOUS | Status: AC
  Administered 2015-02-18: 300 mg via INTRAVENOUS
  Filled 2015-02-18: qty 30

## 2015-02-18 MED ORDER — FAMOTIDINE IN NACL 20-0.9 MG/50ML-% IV SOLN
INTRAVENOUS | Status: AC
  Filled 2015-02-18: qty 50

## 2015-02-18 MED ORDER — PACLITAXEL CHEMO INJECTION 300 MG/50ML
80.0000 mg/m2 | Freq: Once | INTRAVENOUS | Status: AC
  Administered 2015-02-18: 168 mg via INTRAVENOUS
  Filled 2015-02-18: qty 28

## 2015-02-18 NOTE — Patient Instructions (Signed)
[facility] Discharge Instructions for Patients Receiving Chemotherapy  Today you received the following chemotherapy agents Taxol and Carboplatin. To help prevent nausea and vomiting after your treatment, we encourage you to take your nausea medication as directed.  If you develop nausea and vomiting that is not controlled by your nausea medication, call the clinic.   BELOW ARE SYMPTOMS THAT SHOULD BE REPORTED IMMEDIATELY:  *FEVER GREATER THAN 100.5 F  *CHILLS WITH OR WITHOUT FEVER  NAUSEA AND VOMITING THAT IS NOT CONTROLLED WITH YOUR NAUSEA MEDICATION  *UNUSUAL SHORTNESS OF BREATH  *UNUSUAL BRUISING OR BLEEDING  TENDERNESS IN MOUTH AND THROAT WITH OR WITHOUT PRESENCE OF ULCERS  *URINARY PROBLEMS  *BOWEL PROBLEMS  UNUSUAL RASH Items with * indicate a potential emergency and should be followed up as soon as possible.  Feel free to call the clinic you have any questions or concerns. The clinic phone number is [phone].  Please show the CHEMO ALERT CARD at check-in to the Emergency Department and triage nurse.    

## 2015-02-18 NOTE — Telephone Encounter (Signed)
Appointments made and avs will be printed for patient in chemo °

## 2015-02-18 NOTE — Progress Notes (Signed)
[facility]  [phone] [phone]     ID: [name] [name] DOB: 09-13-1961  MR#: [id number]  JIR#:[id number]  Patient Care Team: [name] Clack, MD as PCP - General [name] Hipp, MD as Consulting Physician (Obstetrics and Gynecology) [name] Shipper, MD (Gastroenterology) [name] Caldron. [name], MD (Neurology) [name] Bookbinder, MD as Consulting Physician (General Surgery) [name], MD as Consulting Physician (Oncology) [name] Gibson, MD as Attending Physician (Radiation Oncology) [name] Germany, RN as Registered Nurse [name] Kaufmann, RN as Registered Nurse OTHER MD:  CHIEF COMPLAINT: Triple negative breast cancer  CURRENT TREATMENT: Neoadjuvant chemotherapy  BREAST CANCER HISTORY: From the original intake note:  [name] [name] had bilateral screening mammography at the [facility] Center 11/21/2013. Breast density was category C. Exam was unremarkable. In January 2016 however the patient palpated a change in her right breast laterally. She contacted her gynecologist, [name] [name], and he set her up for a unilateral right diagnostic mammography with right breast ultrasonography at [facility] 10/15/2014. There was a focal irregular opacity in the lateral right breast associated with pleomorphic calcifications. This was palpable on exam as a firm non-mobile mass. Ultrasound confirmed a hypoechoic mass in the right breast at the 9:00 position 5 cm from the nipple, measuring 2.9 cm. Next to this mass was a normal-sized intramammary lymph node measuring 4 mm. In the right axilla there were 3 contiguous level I lymph nodes the largest measuring 11 mm.  On 10/20/2014 the patient underwent right core needle biopsy of the 9:00 breast mass, showing ([id number]) and invasive ductal carcinoma, grade 3, triple negative, with the HER-2/neu signals ratio of 1.26 and number per cell 2.15. The MIB-1 was 71%. Biopsy of the largest right axillary lymph node was negative. Dr.  Owens [name] the mammographer who performed a biopsy describes this is concordant.  Left breast mammography 10/26/2014 was negative, and bilateral breast MRI the same day confirmed, in the right breast, and upper outer quadrant mass measuring 2.5 cm, with a second mass measuring 1.4 cm slightly anterior and superior to it. In addition there was a total area of approximately 6.5 cm of non-masslike enhancement extending throughout the upper outer quadrant of the right breast. There was a cortically thickened right axillary lymph node which had been biopsied. There was also a mildly cortically thickened right subpectoral lymph node. There were no internal mammary or left axillary lymph nodes in the left breast was unremarkable.  The patient's subsequent history is as detailed below.  INTERVAL HISTORY: [name] [name] returns today for follow up of her breast cancer. Today is day 1, cycle 5 of 12 of paclitaxel and carboplatin. She tolerates this chemotherapy well with no complaints or reactions. She has a mild temperature of 99.6 today, but is asymptomatic.   REVIEW OF SYSTEMS: [name] [name] denies fevers, chills, nausea, vomiting, or changes in bowel or bladder habits. She is eating and drinking well. She has no mouth sores, rashes, or neuropathy symptoms. She has no shortness of breath, chest pain, cough, or palpitations. She has good energy, and sleeps well at night. A detailed review of systems is otherwise stable.   PAST MEDICAL HISTORY: Past Medical History  Diagnosis Date  . METABOLIC SYNDROME X   . OBESITY, TRUNCAL   . CONSTIPATION, CHRONIC   . KELOID   . Multiple sclerosis   . Allergic rhinitis due to other allergen   . HYPERTENSION   . HYPERLIPIDEMIA   . GERD   . Angioedema     ACEI  .  Colon polyps 05/2014    adenomatous, colo q 5y  . Migraine headache   . Allergy   . Anxiety   . Breast cancer of upper-outer quadrant of right female breast 10/23/2014  . Hot flashes     PAST SURGICAL HISTORY: Past  Surgical History  Procedure Laterality Date  . Tubal ligation    . Colonoscopy    . Portacath placement Left 11/06/2014    Procedure: INSERTION PORT-A-CATH;  Surgeon: [name] Bookbinder, MD;  Location: [facility];  Service: General;  Laterality: Left;    FAMILY HISTORY Family History  Problem Relation Age of Onset  . Coronary artery disease Mother   . Heart disease Mother   . Diabetes Mother   . Hypertension Other   . Hyperlipidemia Other   . Diabetes Other   . Colon cancer Neg Hx   . Alcohol abuse Father   . COPD Maternal Grandmother    the patient's father died at the age of 63 following his seizure. The patient's mother died from congestive 21 failure at the age of 57. The patient had 5 brothers and 2 sisters. One brother died from Escherichia coli infection, the other one had a history of seizure disorder. One sister died from causes unknown to the patient. There is no history of breast or ovarian cancer in the family.  GYNECOLOGIC HISTORY:  Patient's last menstrual period was 11/01/2012. Menarche age 57, first live birth age 74. The patient is GX P2. She stopped having periods in May 2014. She did not take hormone replacement. She did take her control pills remotely for approximately 4 years with no complications.  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 Her daughter [name] [name] lives in [place] and works in Ambulance person for [employer]. Son [name] [name] is an Engineer, building services for [place]: Not in place   HEALTH MAINTENANCE: History  Substance Use Topics  . Smoking status: Never Smoker   . Smokeless tobacco: Never Used     Comment: Single- brother staying with pt since mom's death  . Alcohol Use: No     Colonoscopy: September 2015/[name] [name]  PAP: February 2015  Bone density:  Lipid  panel:  Allergies  Allergen Reactions  . Ace Inhibitors Anaphylaxis    REACTION: Angioedema (tongue swelling)  . Clarithromycin Anaphylaxis    Throat swells  . Levaquin [Levofloxacin] Other (See Comments)    Tendon pain - shoulder and calf    Current Outpatient Prescriptions  Medication Sig Dispense Refill  . Glatiramer Acetate (COPAXONE) 40 MG/ML SOSY Inject 40 mg into the skin 3 (three) times a week. 12 Syringe 0  . hydrochlorothiazide (HYDRODIURIL) 25 MG tablet TAKE 1 TABLET DAILY 90 tablet 3  . lidocaine-prilocaine (EMLA) cream Apply to affected area once 30 g 3  . losartan (COZAAR) 50 MG tablet Take 1 tablet (50 mg total) by mouth 2 (two) times daily. 180 tablet 3  . metoprolol (LOPRESSOR) 50 MG tablet TAKE 1 TABLET TWICE A DAY 180 tablet 3  . pantoprazole (PROTONIX) 40 MG tablet Take 1 tablet (40 mg total) by mouth daily. 90 tablet 1  . potassium chloride (MICRO-K) 10 MEQ CR capsule Take 1 capsule (10 mEq total) by mouth 2 (two) times daily. (Patient taking differently: Take 10 mEq by mouth 2 (two) times daily. Pt taking 2 - 10 mEq tablets 2 times  a day. Total dose 40 mEq daily.) 180 capsule 0  . ALPRAZolam (XANAX) 0.5 MG tablet Take 1 tablet (0.5 mg total) by mouth 3 (three) times daily as needed for anxiety or sleep. (Patient not taking: Reported on 02/04/2015) 30 tablet 1  . loratadine-pseudoephedrine (CLARITIN-D 12 HOUR) 5-120 MG per tablet Take 1 tablet by mouth as needed for allergies. (Patient not taking: Reported on 01/28/2015) 30 tablet 3  . [DISCONTINUED] mometasone (NASONEX) 50 MCG/ACT nasal spray Place 2 sprays into the nose daily. 17 g 2   No current facility-administered medications for this visit.    OBJECTIVE: Middle-aged African-American woman in no acute distress Filed Vitals:   02/18/15 1247  BP: 117/59  [name]: 99  Temp: 99.6 F (37.6 C)  Resp: 18     Body mass index is 34.98 kg/(m^2).    ECOG FS:0 - Asymptomatic   Sclerae unicteric, pupils round and  equal Oropharynx clear and moist-- no thrush or other lesions No cervical or supraclavicular adenopathy Lungs no rales or rhonchi Heart regular rate and rhythm Abd soft, nontender, positive bowel sounds MSK no focal spinal tenderness, no upper extremity lymphedema Neuro: nonfocal, well oriented, appropriate affect Breasts: deferred  LAB RESULTS:  CMP     Component Value Date/Time   NA 139 02/18/2015 1227   NA 133* 12/31/2014 1102   K 3.4* 02/18/2015 1227   K 2.7* 12/31/2014 1102   CL 97 12/31/2014 1102   CO2 25 02/18/2015 1227   CO2 26 12/31/2014 1102   GLUCOSE 134 02/18/2015 1227   GLUCOSE 129* 12/31/2014 1102   BUN 9.7 02/18/2015 1227   BUN 9 12/31/2014 1102   CREATININE 0.8 02/18/2015 1227   CREATININE 0.82 12/31/2014 1102   CALCIUM 9.3 02/18/2015 1227   CALCIUM 9.0 12/31/2014 1102   PROT 6.6 02/18/2015 1227   PROT 7.3 12/31/2014 1102   ALBUMIN 3.7 02/18/2015 1227   ALBUMIN 4.0 12/31/2014 1102   AST 21 02/18/2015 1227   AST 17 12/31/2014 1102   ALT 21 02/18/2015 1227   ALT 13 12/31/2014 1102   ALKPHOS 76 02/18/2015 1227   ALKPHOS 101 12/31/2014 1102   BILITOT 0.74 02/18/2015 1227   BILITOT 0.4 12/31/2014 1102   GFRNONAA 81* 12/31/2014 1102   GFRAA >90 12/31/2014 1102    INo results found for: SPEP, UPEP  Lab Results  Component Value Date   WBC 3.6* 02/18/2015   NEUTROABS 1.8 02/18/2015   HGB 9.6* 02/18/2015   HCT 28.3* 02/18/2015   MCV 87.9 02/18/2015   PLT 110* 02/18/2015      Chemistry      Component Value Date/Time   NA 139 02/18/2015 1227   NA 133* 12/31/2014 1102   K 3.4* 02/18/2015 1227   K 2.7* 12/31/2014 1102   CL 97 12/31/2014 1102   CO2 25 02/18/2015 1227   CO2 26 12/31/2014 1102   BUN 9.7 02/18/2015 1227   BUN 9 12/31/2014 1102   CREATININE 0.8 02/18/2015 1227   CREATININE 0.82 12/31/2014 1102      Component Value Date/Time   CALCIUM 9.3 02/18/2015 1227   CALCIUM 9.0 12/31/2014 1102   ALKPHOS 76 02/18/2015 1227   ALKPHOS 101  12/31/2014 1102   AST 21 02/18/2015 1227   AST 17 12/31/2014 1102   ALT 21 02/18/2015 1227   ALT 13 12/31/2014 1102   BILITOT 0.74 02/18/2015 1227   BILITOT 0.4 12/31/2014 1102       No results found for: LABCA2  No components  found for: [id number]  No results for input(s): INR in the last 168 hours.  Urinalysis    Component Value Date/Time   COLORURINE YELLOW 01/14/2014 1206   APPEARANCEUR CLEAR 01/14/2014 1206   LABSPEC <=1.005* 01/14/2014 1206   PHURINE 5.5 01/14/2014 1206   GLUCOSEU NEGATIVE 01/14/2014 1206   HGBUR NEGATIVE 01/14/2014 1206   HGBUR negative 08/19/2010 1041   BILIRUBINUR NEGATIVE 01/14/2014 1206   KETONESUR NEGATIVE 01/14/2014 1206   UROBILINOGEN 0.2 01/14/2014 1206   NITRITE NEGATIVE 01/14/2014 1206   LEUKOCYTESUR NEGATIVE 01/14/2014 1206    STUDIES: No results found.  ASSESSMENT: 54 y.o. 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   2) neoadjuvant chemotherapy to consist of cyclophosphamide and doxorubicin in dose dense fashion 4 given with Neulasta support, completed 01/06/2015, followed by weekly carboplatin and paclitaxel 12  (3) definitive surgery to follow chemotherapy  (4) adjuvant radiation, if appropriate, to follow surgery  PLAN: [name] [name] looks and feels well today. The labs were reviewed in detail and were stable. She is a bit more anemic than the previous weeks, but her energy is  She will proceed with day 1, cycle 5 of paclitaxel and  carboplatin as planned today.   She will give [place] a call if any true fevers develop, but I believe this will be unlikely.  [name] [name] will return in 1 week for labs, a follow up visit, and cycle 6 of treatment. She understands and agrees with this plan. She knows the goal of treatment in her case is cure. She has been encouraged to call with any issues that might arise before her next visit here.    Laurie Panda, NP   02/18/2015 1:42 PM

## 2015-02-19 ENCOUNTER — Other Ambulatory Visit: Payer: Self-pay | Admitting: Nurse Practitioner

## 2015-02-25 ENCOUNTER — Ambulatory Visit (HOSPITAL_BASED_OUTPATIENT_CLINIC_OR_DEPARTMENT_OTHER): Payer: BC Managed Care – PPO

## 2015-02-25 ENCOUNTER — Ambulatory Visit (HOSPITAL_BASED_OUTPATIENT_CLINIC_OR_DEPARTMENT_OTHER): Payer: BC Managed Care – PPO | Admitting: Nurse Practitioner

## 2015-02-25 ENCOUNTER — Encounter: Payer: Self-pay | Admitting: Nurse Practitioner

## 2015-02-25 ENCOUNTER — Other Ambulatory Visit (HOSPITAL_BASED_OUTPATIENT_CLINIC_OR_DEPARTMENT_OTHER): Payer: BC Managed Care – PPO

## 2015-02-25 VITALS — BP 133/82 | HR 104 | Temp 98.2°F | Resp 18 | Ht 66.0 in | Wt 218.8 lb

## 2015-02-25 DIAGNOSIS — D696 Thrombocytopenia, unspecified: Secondary | ICD-10-CM | POA: Diagnosis not present

## 2015-02-25 DIAGNOSIS — C50811 Malignant neoplasm of overlapping sites of right female breast: Secondary | ICD-10-CM

## 2015-02-25 DIAGNOSIS — Z5111 Encounter for antineoplastic chemotherapy: Secondary | ICD-10-CM

## 2015-02-25 DIAGNOSIS — D6959 Other secondary thrombocytopenia: Secondary | ICD-10-CM | POA: Insufficient documentation

## 2015-02-25 DIAGNOSIS — C50411 Malignant neoplasm of upper-outer quadrant of right female breast: Secondary | ICD-10-CM

## 2015-02-25 DIAGNOSIS — Z171 Estrogen receptor negative status [ER-]: Secondary | ICD-10-CM | POA: Diagnosis not present

## 2015-02-25 DIAGNOSIS — I10 Essential (primary) hypertension: Secondary | ICD-10-CM

## 2015-02-25 DIAGNOSIS — D6481 Anemia due to antineoplastic chemotherapy: Secondary | ICD-10-CM | POA: Insufficient documentation

## 2015-02-25 DIAGNOSIS — R5383 Other fatigue: Secondary | ICD-10-CM

## 2015-02-25 DIAGNOSIS — T451X5A Adverse effect of antineoplastic and immunosuppressive drugs, initial encounter: Secondary | ICD-10-CM

## 2015-02-25 DIAGNOSIS — G35 Multiple sclerosis: Secondary | ICD-10-CM

## 2015-02-25 HISTORY — DX: Other secondary thrombocytopenia: D69.59

## 2015-02-25 HISTORY — DX: Adverse effect of antineoplastic and immunosuppressive drugs, initial encounter: T45.1X5A

## 2015-02-25 HISTORY — DX: Adverse effect of antineoplastic and immunosuppressive drugs, initial encounter: D64.81

## 2015-02-25 LAB — CBC WITH DIFFERENTIAL/PLATELET
BASO%: 0.2 % (ref 0.0–2.0)
Basophils Absolute: 0 10*3/uL (ref 0.0–0.1)
EOS%: 1.4 % (ref 0.0–7.0)
Eosinophils Absolute: 0.1 10*3/uL (ref 0.0–0.5)
HCT: 26.6 % — ABNORMAL LOW (ref 34.8–46.6)
HEMOGLOBIN: 9 g/dL — AB (ref 11.6–15.9)
LYMPH%: 50.7 % — ABNORMAL HIGH (ref 14.0–49.7)
MCH: 29.9 pg (ref 25.1–34.0)
MCHC: 33.8 g/dL (ref 31.5–36.0)
MCV: 88.4 fL (ref 79.5–101.0)
MONO#: 0.2 10*3/uL (ref 0.1–0.9)
MONO%: 5.4 % (ref 0.0–14.0)
NEUT%: 42.3 % (ref 38.4–76.8)
NEUTROS ABS: 1.8 10*3/uL (ref 1.5–6.5)
NRBC: 1 % — AB (ref 0–0)
PLATELETS: 70 10*3/uL — AB (ref 145–400)
RBC: 3.01 10*6/uL — ABNORMAL LOW (ref 3.70–5.45)
RDW: 19.3 % — ABNORMAL HIGH (ref 11.2–14.5)
WBC: 4.3 10*3/uL (ref 3.9–10.3)
lymph#: 2.2 10*3/uL (ref 0.9–3.3)

## 2015-02-25 LAB — COMPREHENSIVE METABOLIC PANEL (CC13)
ALT: 19 U/L (ref 0–55)
ANION GAP: 12 meq/L — AB (ref 3–11)
AST: 19 U/L (ref 5–34)
Albumin: 3.9 g/dL (ref 3.5–5.0)
Alkaline Phosphatase: 77 U/L (ref 40–150)
BUN: 12.2 mg/dL (ref 7.0–26.0)
CALCIUM: 9.1 mg/dL (ref 8.4–10.4)
CO2: 22 mEq/L (ref 22–29)
Chloride: 105 mEq/L (ref 98–109)
Creatinine: 0.8 mg/dL (ref 0.6–1.1)
EGFR: >90 mL/min/{1.73_m2} (ref >90)
GLUCOSE: 118 mg/dL (ref 70–140)
POTASSIUM: 3.5 meq/L (ref 3.5–5.1)
Sodium: 139 mEq/L (ref 136–145)
Total Bilirubin: 0.68 mg/dL (ref 0.20–1.20)
Total Protein: 6.7 g/dL (ref 6.4–8.3)

## 2015-02-25 MED ORDER — DIPHENHYDRAMINE HCL 50 MG/ML IJ SOLN
INTRAMUSCULAR | Status: AC
  Filled 2015-02-25: qty 1

## 2015-02-25 MED ORDER — PACLITAXEL CHEMO INJECTION 300 MG/50ML
80.0000 mg/m2 | Freq: Once | INTRAVENOUS | Status: AC
  Administered 2015-02-25: 168 mg via INTRAVENOUS
  Filled 2015-02-25: qty 28

## 2015-02-25 MED ORDER — FAMOTIDINE IN NACL 20-0.9 MG/50ML-% IV SOLN
INTRAVENOUS | Status: AC
  Filled 2015-02-25: qty 50

## 2015-02-25 MED ORDER — SODIUM CHLORIDE 0.9 % IV SOLN
Freq: Once | INTRAVENOUS | Status: AC
  Administered 2015-02-25: 10:00:00 via INTRAVENOUS
  Filled 2015-02-25: qty 8

## 2015-02-25 MED ORDER — SODIUM CHLORIDE 0.9 % IJ SOLN
10.0000 mL | INTRAMUSCULAR | Status: DC | PRN
  Administered 2015-02-25: 10 mL
  Filled 2015-02-25: qty 10

## 2015-02-25 MED ORDER — DIPHENHYDRAMINE HCL 50 MG/ML IJ SOLN
25.0000 mg | Freq: Once | INTRAMUSCULAR | Status: AC
  Administered 2015-02-25: 25 mg via INTRAVENOUS

## 2015-02-25 MED ORDER — HEPARIN SOD (PORK) LOCK FLUSH 100 UNIT/ML IV SOLN
500.0000 [IU] | Freq: Once | INTRAVENOUS | Status: AC | PRN
  Administered 2015-02-25: 500 [IU]
  Filled 2015-02-25: qty 5

## 2015-02-25 MED ORDER — SODIUM CHLORIDE 0.9 % IV SOLN
Freq: Once | INTRAVENOUS | Status: AC
  Administered 2015-02-25: 10:00:00 via INTRAVENOUS

## 2015-02-25 MED ORDER — FAMOTIDINE IN NACL 20-0.9 MG/50ML-% IV SOLN
20.0000 mg | Freq: Once | INTRAVENOUS | Status: AC
  Administered 2015-02-25: 20 mg via INTRAVENOUS

## 2015-02-25 NOTE — Progress Notes (Signed)
[facility]  Telephone:(336) [phone] Fax:(336) [phone]     ID: [name] [name] DOB: 1961-05-10  MR#: [id number]  MNO#:[id number]  Patient Care Team: [name] Clack, MD as PCP - General [name] Hipp, MD as Consulting Physician (Obstetrics and Gynecology) [name] Shipper, MD (Gastroenterology) [name] Caldron. [name], MD (Neurology) [name] Bookbinder, MD as Consulting Physician (General Surgery) [name] Cruel, MD as Consulting Physician (Oncology) [name] Gibson, MD as Attending Physician (Radiation Oncology) [name] Germany, RN as Registered Nurse [name] Kaufmann, RN as Registered Nurse OTHER MD:  CHIEF COMPLAINT: Triple negative breast cancer  CURRENT TREATMENT: Neoadjuvant chemotherapy  BREAST CANCER HISTORY: From the original intake note:  [name] [name] had bilateral screening mammography at the breast Center 11/21/2013. Breast density was category C. Exam was unremarkable. In January 2016 however the patient palpated a change in her right breast laterally. She contacted her gynecologist, [name] [name], and he set her up for a unilateral right diagnostic mammography with right breast ultrasonography at [facility] 10/15/2014. There was a focal irregular opacity in the lateral right breast associated with pleomorphic calcifications. This was palpable on exam as a firm non-mobile mass. Ultrasound confirmed a hypoechoic mass in the right breast at the 9:00 position 5 cm from the nipple, measuring 2.9 cm. Next to this mass was a normal-sized intramammary lymph node measuring 4 mm. In the right axilla there were 3 contiguous level I lymph nodes the largest measuring 11 mm.  On 10/20/2014 the patient underwent right core needle biopsy of the 9:00 breast mass, showing ([id number]) and invasive ductal carcinoma, grade 3, triple negative, with the HER-2/neu signals ratio of 1.26 and number per cell 2.15. The MIB-1 was 71%. Biopsy of the largest right axillary lymph node was negative. Dr.  Owens [name] the mammographer who performed a biopsy describes this is concordant.  Left breast mammography 10/26/2014 was negative, and bilateral breast MRI the same day confirmed, in the right breast, and upper outer quadrant mass measuring 2.5 cm, with a second mass measuring 1.4 cm slightly anterior and superior to it. In addition there was a total area of approximately 6.5 cm of non-masslike enhancement extending throughout the upper outer quadrant of the right breast. There was a cortically thickened right axillary lymph node which had been biopsied. There was also a mildly cortically thickened right subpectoral lymph node. There were no internal mammary or left axillary lymph nodes in the left breast was unremarkable.  The patient's subsequent history is as detailed below.  INTERVAL HISTORY: [name] [name] returns today for follow up of her breast cancer. Today is day 1, cycle 6 of 12 of paclitaxel and carboplatin. She tolerates this chemotherapy well. She is a bit more fatigued this week. Also she has had some bloody nasal discharge and has been applying neosporin to her left nare.  REVIEW OF SYSTEMS: [name] [name] denies fevers, chills, nausea, vomiting, or changes in bowel or bladder habits. She is eating and drinking well. She has no mouth sores, rashes, or neuropathy symptoms. She has no shortness of breath, chest pain, cough, or palpitations. She denies headaches, dizziness, weakness, or vision changes. A detailed review of systems is otherwise stable.   PAST MEDICAL HISTORY: Past Medical History  Diagnosis Date  . METABOLIC SYNDROME X   . OBESITY, TRUNCAL   . CONSTIPATION, CHRONIC   . KELOID   . Multiple sclerosis   . Allergic rhinitis due to other allergen   . HYPERTENSION   . HYPERLIPIDEMIA   . GERD   .  Angioedema     ACEI  . Colon polyps 05/2014    adenomatous, colo q 5y  . Migraine headache   . Allergy   . Anxiety   . Breast cancer of upper-outer quadrant of right female breast 10/23/2014  .  Hot flashes     PAST SURGICAL HISTORY: Past Surgical History  Procedure Laterality Date  . Tubal ligation    . Colonoscopy    . Portacath placement Left 11/06/2014    Procedure: INSERTION PORT-A-CATH;  Surgeon: [name] Bookbinder, MD;  Location: [facility];  Service: General;  Laterality: Left;    FAMILY HISTORY Family History  Problem Relation Age of Onset  . Coronary artery disease Mother   . Heart disease Mother   . Diabetes Mother   . Hypertension Other   . Hyperlipidemia Other   . Diabetes Other   . Colon cancer Neg Hx   . Alcohol abuse Father   . COPD Maternal Grandmother    the patient's father died at the age of 72 following his seizure. The patient's mother died from congestive 53 failure at the age of 24. The patient had 5 brothers and 2 sisters. One brother died from Escherichia coli infection, the other one had a history of seizure disorder. One sister died from causes unknown to the patient. There is no history of breast or ovarian cancer in the family.  GYNECOLOGIC HISTORY:  Patient's last menstrual period was 11/01/2012. Menarche age 41, first live birth age 33. The patient is GX P2. She stopped having periods in May 2014. She did not take hormone replacement. She did take her control pills remotely for approximately 4 years with no complications.  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 Her daughter [name] [name] lives in [place] and works in Ambulance person for [employer]. Son [name] [name] is an Engineer, building services for [place]: Not in place   HEALTH MAINTENANCE: History  Substance Use Topics  . Smoking status: Never Smoker   . Smokeless tobacco: Never Used     Comment: Single- brother staying with pt since mom's death  . Alcohol Use: No     Colonoscopy: September 2015/[name] [name]  PAP:  February 2015  Bone density:  Lipid panel:  Allergies  Allergen Reactions  . Ace Inhibitors Anaphylaxis    REACTION: Angioedema (tongue swelling)  . Clarithromycin Anaphylaxis    Throat swells  . Levaquin [Levofloxacin] Other (See Comments)    Tendon pain - shoulder and calf    Current Outpatient Prescriptions  Medication Sig Dispense Refill  . Glatiramer Acetate (COPAXONE) 40 MG/ML SOSY Inject 40 mg into the skin 3 (three) times a week. 12 Syringe 0  . hydrochlorothiazide (HYDRODIURIL) 25 MG tablet TAKE 1 TABLET DAILY 90 tablet 3  . lidocaine-prilocaine (EMLA) cream Apply to affected area once 30 g 3  . loratadine-pseudoephedrine (CLARITIN-D 12 HOUR) 5-120 MG per tablet Take 1 tablet by mouth as needed for allergies. 30 tablet 3  . losartan (COZAAR) 50 MG tablet Take 1 tablet (50 mg total) by mouth 2 (two) times daily. 180 tablet 3  . metoprolol (LOPRESSOR) 50 MG tablet TAKE 1 TABLET TWICE A DAY 180 tablet 3  . pantoprazole (PROTONIX) 40 MG tablet Take 1 tablet (40 mg total) by mouth daily. 90 tablet 1  . potassium chloride (MICRO-K) 10 MEQ CR capsule Take 1 capsule (  10 mEq total) by mouth 2 (two) times daily. (Patient taking differently: Take 10 mEq by mouth 2 (two) times daily. Pt taking 2 - 10 mEq tablets 2 times a day. Total dose 40 mEq daily.) 180 capsule 0  . ALPRAZolam (XANAX) 0.5 MG tablet Take 1 tablet (0.5 mg total) by mouth 3 (three) times daily as needed for anxiety or sleep. (Patient not taking: Reported on 02/04/2015) 30 tablet 1  . [DISCONTINUED] mometasone (NASONEX) 50 MCG/ACT nasal spray Place 2 sprays into the nose daily. 17 g 2   No current facility-administered medications for this visit.    OBJECTIVE: Middle-aged African-American woman in no acute distress Filed Vitals:   02/25/15 0841  BP: 133/82  [name]: 104  Temp: 98.2 F (36.8 C)  Resp: 18     Body mass index is 35.33 kg/(m^2).    ECOG FS:0 - Asymptomatic   Skin: warm, dry  HEENT: sclerae anicteric,  conjunctivae pink, oropharynx clear. No thrush or mucositis.  Lymph Nodes: No cervical or supraclavicular lymphadenopathy  Lungs: clear to auscultation bilaterally, no rales, wheezes, or rhonci  Heart: regular rate and rhythm  Abdomen: round, soft, non tender, positive bowel sounds  Musculoskeletal: No focal spinal tenderness, no peripheral edema  Neuro: non focal, well oriented, positive affect  Breasts: deferred    LAB RESULTS:  CMP     Component Value Date/Time   NA 139 02/25/2015 0828   NA 133* 12/31/2014 1102   K 3.5 02/25/2015 0828   K 2.7* 12/31/2014 1102   CL 97 12/31/2014 1102   CO2 22 02/25/2015 0828   CO2 26 12/31/2014 1102   GLUCOSE 118 02/25/2015 0828   GLUCOSE 129* 12/31/2014 1102   BUN 12.2 02/25/2015 0828   BUN 9 12/31/2014 1102   CREATININE 0.8 02/25/2015 0828   CREATININE 0.82 12/31/2014 1102   CALCIUM 9.1 02/25/2015 0828   CALCIUM 9.0 12/31/2014 1102   PROT 6.7 02/25/2015 0828   PROT 7.3 12/31/2014 1102   ALBUMIN 3.9 02/25/2015 0828   ALBUMIN 4.0 12/31/2014 1102   AST 19 02/25/2015 0828   AST 17 12/31/2014 1102   ALT 19 02/25/2015 0828   ALT 13 12/31/2014 1102   ALKPHOS 77 02/25/2015 0828   ALKPHOS 101 12/31/2014 1102   BILITOT 0.68 02/25/2015 0828   BILITOT 0.4 12/31/2014 1102   GFRNONAA 81* 12/31/2014 1102   GFRAA >90 12/31/2014 1102    INo results found for: SPEP, UPEP  Lab Results  Component Value Date   WBC 4.3 02/25/2015   NEUTROABS 1.8 02/25/2015   HGB 9.0* 02/25/2015   HCT 26.6* 02/25/2015   MCV 88.4 02/25/2015   PLT 70* 02/25/2015      Chemistry      Component Value Date/Time   NA 139 02/25/2015 0828   NA 133* 12/31/2014 1102   K 3.5 02/25/2015 0828   K 2.7* 12/31/2014 1102   CL 97 12/31/2014 1102   CO2 22 02/25/2015 0828   CO2 26 12/31/2014 1102   BUN 12.2 02/25/2015 0828   BUN 9 12/31/2014 1102   CREATININE 0.8 02/25/2015 0828   CREATININE 0.82 12/31/2014 1102      Component Value Date/Time   CALCIUM 9.1  02/25/2015 0828   CALCIUM 9.0 12/31/2014 1102   ALKPHOS 77 02/25/2015 0828   ALKPHOS 101 12/31/2014 1102   AST 19 02/25/2015 0828   AST 17 12/31/2014 1102   ALT 19 02/25/2015 0828   ALT 13 12/31/2014 1102   BILITOT 0.68 02/25/2015 3300  BILITOT 0.4 12/31/2014 1102       No results found for: LABCA2  No components found for: [id number]  No results for input(s): INR in the last 168 hours.  Urinalysis    Component Value Date/Time   COLORURINE YELLOW 01/14/2014 1206   APPEARANCEUR CLEAR 01/14/2014 1206   LABSPEC <=1.005* 01/14/2014 1206   PHURINE 5.5 01/14/2014 1206   GLUCOSEU NEGATIVE 01/14/2014 1206   HGBUR NEGATIVE 01/14/2014 1206   HGBUR negative 08/19/2010 1041   BILIRUBINUR NEGATIVE 01/14/2014 1206   KETONESUR NEGATIVE 01/14/2014 1206   UROBILINOGEN 0.2 01/14/2014 1206   NITRITE NEGATIVE 01/14/2014 1206   LEUKOCYTESUR NEGATIVE 01/14/2014 1206    STUDIES: No results found.  ASSESSMENT: 54 y.o. 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   2) neoadjuvant chemotherapy to consist of cyclophosphamide and doxorubicin in dose dense fashion 4 given with Neulasta support, completed 01/06/2015, followed by weekly carboplatin and paclitaxel 12  (3) definitive surgery to follow chemotherapy  (4) adjuvant radiation, if appropriate, to follow surgery  PLAN: The labs were reviewed in detail and her platelet count is down to 70. I consulted with Dr. Jana Hakim, and he suggested  holding the carboplatin at this time, and possibly allowing her to resume it in the future when she has recovered. She will proceed with cycle 6 paclitaxel alone at this time. Her hgb is down to 9.0, but besides mild fatigue she is asymptomatic. We will continue to monitor these values.   Jaydi will return in 1 week for cycle 7 of treatment. She understands and agrees with this plan. She knows the goal of treatment in her case is cure. She has been encouraged to call with any issues that might arise before her next visit here.   Laurie Panda, NP   02/25/2015 9:25 AM

## 2015-02-25 NOTE — Patient Instructions (Signed)
[facility] Discharge Instructions for Patients Receiving Chemotherapy  Today you received the following chemotherapy agents:  Taxol  To help prevent nausea and vomiting after your treatment, we encourage you to take your nausea medication as ordered per MD.   If you develop nausea and vomiting that is not controlled by your nausea medication, call the clinic.   BELOW ARE SYMPTOMS THAT SHOULD BE REPORTED IMMEDIATELY:  *FEVER GREATER THAN 100.5 F  *CHILLS WITH OR WITHOUT FEVER  NAUSEA AND VOMITING THAT IS NOT CONTROLLED WITH YOUR NAUSEA MEDICATION  *UNUSUAL SHORTNESS OF BREATH  *UNUSUAL BRUISING OR BLEEDING  TENDERNESS IN MOUTH AND THROAT WITH OR WITHOUT PRESENCE OF ULCERS  *URINARY PROBLEMS  *BOWEL PROBLEMS  UNUSUAL RASH Items with * indicate a potential emergency and should be followed up as soon as possible.  Feel free to call the clinic you have any questions or concerns. The clinic phone number is [phone].  Please show the CHEMO ALERT CARD at check-in to the Emergency Department and triage nurse.   

## 2015-03-04 ENCOUNTER — Ambulatory Visit (HOSPITAL_COMMUNITY)
Admission: RE | Admit: 2015-03-04 | Discharge: 2015-03-04 | Disposition: A | Discharge status: Home / Self Care | Payer: BC Managed Care – PPO | Source: Ambulatory Visit | Attending: Oncology | Admitting: Oncology

## 2015-03-04 ENCOUNTER — Encounter: Payer: Self-pay | Admitting: Nurse Practitioner

## 2015-03-04 ENCOUNTER — Ambulatory Visit (HOSPITAL_BASED_OUTPATIENT_CLINIC_OR_DEPARTMENT_OTHER): Payer: BC Managed Care – PPO | Admitting: Nurse Practitioner

## 2015-03-04 ENCOUNTER — Other Ambulatory Visit (HOSPITAL_BASED_OUTPATIENT_CLINIC_OR_DEPARTMENT_OTHER): Payer: BC Managed Care – PPO

## 2015-03-04 ENCOUNTER — Inpatient Hospital Stay (HOSPITAL_COMMUNITY): Admit: 2015-03-04 | Payer: Self-pay

## 2015-03-04 ENCOUNTER — Ambulatory Visit (HOSPITAL_BASED_OUTPATIENT_CLINIC_OR_DEPARTMENT_OTHER): Payer: BC Managed Care – PPO

## 2015-03-04 ENCOUNTER — Telehealth: Payer: Self-pay | Admitting: Nurse Practitioner

## 2015-03-04 ENCOUNTER — Ambulatory Visit: Payer: BC Managed Care – PPO

## 2015-03-04 VITALS — BP 133/63 | HR 102 | Temp 98.0°F | Resp 20 | Wt 219.2 lb

## 2015-03-04 VITALS — BP 123/68 | HR 100 | Temp 97.7°F | Resp 18

## 2015-03-04 DIAGNOSIS — I10 Essential (primary) hypertension: Secondary | ICD-10-CM

## 2015-03-04 DIAGNOSIS — C50811 Malignant neoplasm of overlapping sites of right female breast: Secondary | ICD-10-CM

## 2015-03-04 DIAGNOSIS — D6481 Anemia due to antineoplastic chemotherapy: Secondary | ICD-10-CM | POA: Insufficient documentation

## 2015-03-04 DIAGNOSIS — T451X5A Adverse effect of antineoplastic and immunosuppressive drugs, initial encounter: Secondary | ICD-10-CM | POA: Insufficient documentation

## 2015-03-04 DIAGNOSIS — C50411 Malignant neoplasm of upper-outer quadrant of right female breast: Secondary | ICD-10-CM

## 2015-03-04 DIAGNOSIS — D701 Agranulocytosis secondary to cancer chemotherapy: Secondary | ICD-10-CM

## 2015-03-04 DIAGNOSIS — G35 Multiple sclerosis: Secondary | ICD-10-CM

## 2015-03-04 DIAGNOSIS — D6959 Other secondary thrombocytopenia: Secondary | ICD-10-CM | POA: Diagnosis not present

## 2015-03-04 LAB — CBC WITH DIFFERENTIAL/PLATELET
BASO%: 0 % (ref 0.0–2.0)
BASOS ABS: 0 10*3/uL (ref 0.0–0.1)
EOS%: 0.5 % (ref 0.0–7.0)
Eosinophils Absolute: 0 10*3/uL (ref 0.0–0.5)
HEMATOCRIT: 23.2 % — AB (ref 34.8–46.6)
HEMOGLOBIN: 7.9 g/dL — AB (ref 11.6–15.9)
LYMPH%: 61.6 % — ABNORMAL HIGH (ref 14.0–49.7)
MCH: 30.2 pg (ref 25.1–34.0)
MCHC: 34.1 g/dL (ref 31.5–36.0)
MCV: 88.5 fL (ref 79.5–101.0)
MONO#: 0.2 10*3/uL (ref 0.1–0.9)
MONO%: 10.4 % (ref 0.0–14.0)
NEUT#: 0.6 10*3/uL — ABNORMAL LOW (ref 1.5–6.5)
NEUT%: 27.5 % — ABNORMAL LOW (ref 38.4–76.8)
PLATELETS: 109 10*3/uL — AB (ref 145–400)
RBC: 2.62 10*6/uL — ABNORMAL LOW (ref 3.70–5.45)
RDW: 19.7 % — ABNORMAL HIGH (ref 11.2–14.5)
WBC: 2.1 10*3/uL — ABNORMAL LOW (ref 3.9–10.3)
lymph#: 1.3 10*3/uL (ref 0.9–3.3)
nRBC: 8 % — ABNORMAL HIGH (ref 0–0)

## 2015-03-04 LAB — COMPREHENSIVE METABOLIC PANEL (CC13)
ALK PHOS: 83 U/L (ref 40–150)
ALT: 17 U/L (ref 0–55)
AST: 16 U/L (ref 5–34)
Albumin: 3.8 g/dL (ref 3.5–5.0)
Anion Gap: 6 mEq/L (ref 3–11)
BILIRUBIN TOTAL: 0.83 mg/dL (ref 0.20–1.20)
BUN: 10.5 mg/dL (ref 7.0–26.0)
CO2: 28 mEq/L (ref 22–29)
Calcium: 9.3 mg/dL (ref 8.4–10.4)
Chloride: 105 mEq/L (ref 98–109)
Creatinine: 0.7 mg/dL (ref 0.6–1.1)
EGFR: >90 mL/min/{1.73_m2} (ref >90)
Glucose: 137 mg/dl (ref 70–140)
Potassium: 3.2 mEq/L — ABNORMAL LOW (ref 3.5–5.1)
SODIUM: 138 meq/L (ref 136–145)
TOTAL PROTEIN: 6.5 g/dL (ref 6.4–8.3)

## 2015-03-04 LAB — ABO/RH: ABO/RH(D): O POS

## 2015-03-04 LAB — PREPARE RBC (CROSSMATCH)

## 2015-03-04 MED ORDER — DIPHENHYDRAMINE HCL 25 MG PO CAPS
ORAL_CAPSULE | ORAL | Status: AC
  Filled 2015-03-04: qty 1

## 2015-03-04 MED ORDER — DIPHENHYDRAMINE HCL 25 MG PO CAPS
25.0000 mg | ORAL_CAPSULE | Freq: Once | ORAL | Status: AC
  Administered 2015-03-04: 25 mg via ORAL

## 2015-03-04 MED ORDER — HEPARIN SOD (PORK) LOCK FLUSH 100 UNIT/ML IV SOLN
500.0000 [IU] | Freq: Every day | INTRAVENOUS | Status: AC | PRN
  Administered 2015-03-04: 500 [IU]
  Filled 2015-03-04: qty 5

## 2015-03-04 MED ORDER — SODIUM CHLORIDE 0.9 % IV SOLN
250.0000 mL | Freq: Once | INTRAVENOUS | Status: AC
  Administered 2015-03-04: 250 mL via INTRAVENOUS

## 2015-03-04 MED ORDER — SODIUM CHLORIDE 0.9 % IJ SOLN
10.0000 mL | INTRAMUSCULAR | Status: AC | PRN
  Administered 2015-03-04: 10 mL
  Filled 2015-03-04: qty 10

## 2015-03-04 NOTE — Patient Instructions (Signed)

## 2015-03-04 NOTE — Telephone Encounter (Signed)
Gave avs & calendar for June/July.  °

## 2015-03-04 NOTE — Progress Notes (Signed)
[facility] [place]  [phone] [phone]) [phone]     ID: [name] [name] DOB: 1960-12-10  MR#: [id number]  QTM#:[id number]  Patient Care Team: [name], MD as PCP - General [name], MD as Consulting Physician (Obstetrics and Gynecology) [name], MD (Gastroenterology) [name]. [name], MD (Neurology) [name], MD as Consulting Physician (General Surgery) [name], MD as Consulting Physician (Oncology) [name], MD as Attending Physician (Radiation Oncology) [name], RN as Registered Nurse [name], RN as Registered Nurse OTHER MD:  CHIEF COMPLAINT: Triple negative breast cancer  CURRENT TREATMENT: Neoadjuvant chemotherapy  BREAST CANCER HISTORY: From the original intake note:  [name] [name] had bilateral screening mammography at the [facility] Center 11/21/2013. Breast density was category C. Exam was unremarkable. In January 2016 however the patient palpated a change in her right breast laterally. She contacted her gynecologist, Dr. [name], and he set her up for a unilateral right diagnostic mammography with right breast ultrasonography at the [facility] 10/15/2014. There was a focal irregular opacity in the lateral right breast associated with pleomorphic calcifications. This was palpable on exam as a firm non-mobile mass. Ultrasound confirmed a hypoechoic mass in the right breast at the 9:00 position 5 cm from the nipple, measuring 2.9 cm. Next to this mass was a normal-sized intramammary lymph node measuring 4 mm. In the right axilla there were 3 contiguous level I lymph nodes the largest measuring 11 mm.  On 10/20/2014 the patient underwent right core needle biopsy of the 9:00 breast mass, showing ([id number]) and invasive ductal carcinoma, grade 3, triple negative, with the HER-2/neu signals ratio of 1.26 and number per cell 2.15. The MIB-1 was 71%. Biopsy of the largest right axillary lymph node was negative. Dr.  Owens Shark the mammographer who performed a biopsy describes this is concordant.  Left breast mammography 10/26/2014 was negative, and bilateral breast MRI the same day confirmed, in the right breast, and upper outer quadrant mass measuring 2.5 cm, with a second mass measuring 1.4 cm slightly anterior and superior to it. In addition there was a total area of approximately 6.5 cm of non-masslike enhancement extending throughout the upper outer quadrant of the right breast. There was a cortically thickened right axillary lymph node which had been biopsied. There was also a mildly cortically thickened right subpectoral lymph node. There were no internal mammary or left axillary lymph nodes in the left breast was unremarkable.  The patient's subsequent history is as detailed below.  INTERVAL HISTORY: [name] [name] returns today for follow up of her breast cancer. Today is day 1, cycle 7 of 12 of paclitaxel and carboplatin. This past week she has been more fatigued than usual. She has not been sleeping well at night. Her right ear is stopped up as well. She has light tingling to her bilateral great toes that is new.   REVIEW OF SYSTEMS: [name] [name] denies fevers, chills, nausea, vomiting, or changes in bowel or bladder habits. She is eating and drinking well. She has no mouth sores or rashes. She has no shortness of breath, chest pain, cough, or palpitations. A detailed review of systems is otherwise stable.   PAST MEDICAL HISTORY: Past Medical History  Diagnosis Date  . METABOLIC SYNDROME X   . OBESITY, TRUNCAL   . CONSTIPATION, CHRONIC   . KELOID   . Multiple sclerosis   . Allergic rhinitis due to other allergen   . HYPERTENSION   . HYPERLIPIDEMIA   . GERD   .  Angioedema     ACEI  . Colon polyps 05/2014    adenomatous, colo q 5y  . Migraine headache   . Allergy   . Anxiety   . Breast cancer of upper-outer quadrant of right female breast 10/23/2014  . Hot flashes     PAST SURGICAL HISTORY: Past Surgical  History  Procedure Laterality Date  . Tubal ligation    . Colonoscopy    . Portacath placement Left 11/06/2014    Procedure: INSERTION PORT-A-CATH;  Surgeon: [name], MD;  Location: [facility];  Service: General;  Laterality: Left;    FAMILY HISTORY Family History  Problem Relation Age of Onset  . Coronary artery disease Mother   . Heart disease Mother   . Diabetes Mother   . Hypertension Other   . Hyperlipidemia Other   . Diabetes Other   . Colon cancer Neg Hx   . Alcohol abuse Father   . COPD Maternal Grandmother    the patient's father died at the age of 57 following his seizure. The patient's mother died from congestive 72 failure at the age of 34. The patient had 5 brothers and 2 sisters. One brother died from Escherichia coli infection, the other one had a history of seizure disorder. One sister died from causes unknown to the patient. There is no history of breast or ovarian cancer in the family.  GYNECOLOGIC HISTORY:  Patient's last menstrual period was 11/01/2012. Menarche age 45, first live birth age 32. The patient is GX P2. She stopped having periods in May 2014. She did not take hormone replacement. She did take her control pills remotely for approximately 4 years with no complications.  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 Her daughter [name] [name] lives in [place] and works in Ambulance person for [employer]. Son [name] [name] is an Engineer, building services for [employer]: Not in place   HEALTH MAINTENANCE: History  Substance Use Topics  . Smoking status: Never Smoker   . Smokeless tobacco: Never Used     Comment: Single- brother staying with pt since mom's death  . Alcohol Use: No     Colonoscopy: September 2015/[name]  PAP: February 2015  Bone density:  Lipid panel:  Allergies    Allergen Reactions  . Ace Inhibitors Anaphylaxis    REACTION: Angioedema (tongue swelling)  . Clarithromycin Anaphylaxis    Throat swells  . Levaquin [Levofloxacin] Other (See Comments)    Tendon pain - shoulder and calf    Current Outpatient Prescriptions  Medication Sig Dispense Refill  . Glatiramer Acetate (COPAXONE) 40 MG/ML SOSY Inject 40 mg into the skin 3 (three) times a week. 12 Syringe 0  . hydrochlorothiazide (HYDRODIURIL) 25 MG tablet TAKE 1 TABLET DAILY 90 tablet 3  . lidocaine-prilocaine (EMLA) cream Apply to affected area once 30 g 3  . loratadine-pseudoephedrine (CLARITIN-D 12 HOUR) 5-120 MG per tablet Take 1 tablet by mouth as needed for allergies. 30 tablet 3  . losartan (COZAAR) 50 MG tablet Take 1 tablet (50 mg total) by mouth 2 (two) times daily. 180 tablet 3  . metoprolol (LOPRESSOR) 50 MG tablet TAKE 1 TABLET TWICE A DAY 180 tablet 3  . pantoprazole (PROTONIX) 40 MG tablet Take 1 tablet (40 mg total) by mouth daily. 90 tablet 1  . potassium chloride (MICRO-K) 10 MEQ CR capsule Take 1  capsule (10 mEq total) by mouth 2 (two) times daily. (Patient taking differently: Take 10 mEq by mouth 2 (two) times daily. Pt taking 2 - 10 mEq tablets 2 times a day. Total dose 40 mEq daily.) 180 capsule 0  . ALPRAZolam (XANAX) 0.5 MG tablet Take 1 tablet (0.5 mg total) by mouth 3 (three) times daily as needed for anxiety or sleep. (Patient not taking: Reported on 02/04/2015) 30 tablet 1  . [DISCONTINUED] mometasone (NASONEX) 50 MCG/ACT nasal spray Place 2 sprays into the nose daily. 17 g 2   No current facility-administered medications for this visit.    OBJECTIVE: Middle-aged African-American woman in no acute distress Filed Vitals:   03/04/15 1041  BP: 133/63  [name]: 102  Temp: 98 F (36.7 C)  Resp: 20     Body mass index is 35.4 kg/(m^2).    ECOG FS:0 - Asymptomatic   Skin: warm, dry  HEENT: sclerae anicteric, conjunctivae pink, oropharynx clear. No thrush or mucositis.   Lymph Nodes: No cervical or supraclavicular lymphadenopathy  Lungs: clear to auscultation bilaterally, no rales, wheezes, or rhonci  Heart: regular rate and rhythm  Abdomen: round, soft, non tender, positive bowel sounds  Musculoskeletal: No focal spinal tenderness, no peripheral edema  Neuro: non focal, well oriented, positive affect  Breasts: deferred  LAB RESULTS:  CMP     Component Value Date/Time   NA 138 03/04/2015 1026   NA 133* 12/31/2014 1102   K 3.2* 03/04/2015 1026   K 2.7* 12/31/2014 1102   CL 97 12/31/2014 1102   CO2 28 03/04/2015 1026   CO2 26 12/31/2014 1102   GLUCOSE 137 03/04/2015 1026   GLUCOSE 129* 12/31/2014 1102   BUN 10.5 03/04/2015 1026   BUN 9 12/31/2014 1102   CREATININE 0.7 03/04/2015 1026   CREATININE 0.82 12/31/2014 1102   CALCIUM 9.3 03/04/2015 1026   CALCIUM 9.0 12/31/2014 1102   PROT 6.5 03/04/2015 1026   PROT 7.3 12/31/2014 1102   ALBUMIN 3.8 03/04/2015 1026   ALBUMIN 4.0 12/31/2014 1102   AST 16 03/04/2015 1026   AST 17 12/31/2014 1102   ALT 17 03/04/2015 1026   ALT 13 12/31/2014 1102   ALKPHOS 83 03/04/2015 1026   ALKPHOS 101 12/31/2014 1102   BILITOT 0.83 03/04/2015 1026   BILITOT 0.4 12/31/2014 1102   GFRNONAA 81* 12/31/2014 1102   GFRAA >90 12/31/2014 1102    INo results found for: SPEP, UPEP  Lab Results  Component Value Date   WBC 2.1* 03/04/2015   NEUTROABS 0.6* 03/04/2015   HGB 7.9* 03/04/2015   HCT 23.2* 03/04/2015   MCV 88.5 03/04/2015   PLT 109* 03/04/2015      Chemistry      Component Value Date/Time   NA 138 03/04/2015 1026   NA 133* 12/31/2014 1102   K 3.2* 03/04/2015 1026   K 2.7* 12/31/2014 1102   CL 97 12/31/2014 1102   CO2 28 03/04/2015 1026   CO2 26 12/31/2014 1102   BUN 10.5 03/04/2015 1026   BUN 9 12/31/2014 1102   CREATININE 0.7 03/04/2015 1026   CREATININE 0.82 12/31/2014 1102      Component Value Date/Time   CALCIUM 9.3 03/04/2015 1026   CALCIUM 9.0 12/31/2014 1102   ALKPHOS 83  03/04/2015 1026   ALKPHOS 101 12/31/2014 1102   AST 16 03/04/2015 1026   AST 17 12/31/2014 1102   ALT 17 03/04/2015 1026   ALT 13 12/31/2014 1102   BILITOT 0.83 03/04/2015 1026  BILITOT 0.4 12/31/2014 1102       No results found for: LABCA2  No components found for: QMGQQ761  No results for input(s): INR in the last 168 hours.  Urinalysis    Component Value Date/Time   COLORURINE YELLOW 01/14/2014 1206   APPEARANCEUR CLEAR 01/14/2014 1206   LABSPEC <=1.005* 01/14/2014 1206   PHURINE 5.5 01/14/2014 1206   GLUCOSEU NEGATIVE 01/14/2014 1206   HGBUR NEGATIVE 01/14/2014 1206   HGBUR negative 08/19/2010 1041   BILIRUBINUR NEGATIVE 01/14/2014 1206   KETONESUR NEGATIVE 01/14/2014 1206   UROBILINOGEN 0.2 01/14/2014 1206   NITRITE NEGATIVE 01/14/2014 1206   LEUKOCYTESUR NEGATIVE 01/14/2014 1206    STUDIES: No results found.  ASSESSMENT: 54 y.o. 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   2) neoadjuvant chemotherapy to consist of cyclophosphamide and doxorubicin in dose dense fashion 4 given with Neulasta support, completed 01/06/2015, followed by weekly carboplatin and paclitaxel 12  (3) definitive surgery to follow chemotherapy  (4) adjuvant radiation, if appropriate, to follow surgery  PLAN: The labs were reviewed in detail and Rechy is pancytopenic. I consulted with Dr. Jana Hakim, and the plan is to hold treatment this week, and to reduce the paclitaxel by 15% next  week. She will resume carboplatin AUC 2 next week pending her platelet count, but it is rising as of this week. We will review her light neuropathy symptoms when she returns.  Because she is symptomatically anemic, she will receive 2 units of blood today instead of chemo. For sleep I suggested she look into melatonin. Her ear exam was normal. I suggested she try clartitin or nasocort in case this is sinus related.  Ashni will return next week for cycle 7 of treatment. She understands and agrees with this plan. She knows the goal of treatment in her case is cure. She has been encouraged to call with any issues that might arise before here next visit here.    [name] Panda, NP   03/04/2015 11:40 AM

## 2015-03-05 ENCOUNTER — Encounter: Payer: Self-pay | Admitting: *Deleted

## 2015-03-05 LAB — TYPE AND SCREEN
ABO/RH(D): O POS
ANTIBODY SCREEN: NEGATIVE
UNIT DIVISION: 0
Unit division: 0

## 2015-03-05 NOTE — Progress Notes (Signed)
Oncology Nurse Navigator Documentation  Oncology Nurse Navigator Flowsheets 03/05/2015  Navigator Encounter Type Treatment  Patient Visit Type Medonc  Treatment Phase Treatment  Barriers/Navigation Needs No barriers at this time  Time Spent with Patient 15   Spoke with patient yesterday.  She was a little down due to having to get a blood transfusion instead of getting her chemo.  Gave encouragement and informed her that this is just a little bump in the road and she can hopefully get back on track next week.  She remains positive though.  Encouraged her to call with any needs or concerns.

## 2015-03-11 ENCOUNTER — Encounter: Payer: Self-pay | Admitting: Nurse Practitioner

## 2015-03-11 ENCOUNTER — Ambulatory Visit (HOSPITAL_BASED_OUTPATIENT_CLINIC_OR_DEPARTMENT_OTHER): Payer: BC Managed Care – PPO | Admitting: Nurse Practitioner

## 2015-03-11 ENCOUNTER — Ambulatory Visit: Payer: BC Managed Care – PPO

## 2015-03-11 ENCOUNTER — Other Ambulatory Visit (HOSPITAL_BASED_OUTPATIENT_CLINIC_OR_DEPARTMENT_OTHER): Payer: BC Managed Care – PPO

## 2015-03-11 VITALS — BP 135/67 | HR 100 | Temp 98.9°F | Resp 20 | Wt 220.5 lb

## 2015-03-11 DIAGNOSIS — T451X5A Adverse effect of antineoplastic and immunosuppressive drugs, initial encounter: Secondary | ICD-10-CM

## 2015-03-11 DIAGNOSIS — C50811 Malignant neoplasm of overlapping sites of right female breast: Secondary | ICD-10-CM | POA: Diagnosis not present

## 2015-03-11 DIAGNOSIS — Z171 Estrogen receptor negative status [ER-]: Secondary | ICD-10-CM

## 2015-03-11 DIAGNOSIS — C50411 Malignant neoplasm of upper-outer quadrant of right female breast: Secondary | ICD-10-CM

## 2015-03-11 DIAGNOSIS — I10 Essential (primary) hypertension: Secondary | ICD-10-CM

## 2015-03-11 DIAGNOSIS — D701 Agranulocytosis secondary to cancer chemotherapy: Secondary | ICD-10-CM

## 2015-03-11 DIAGNOSIS — G35 Multiple sclerosis: Secondary | ICD-10-CM

## 2015-03-11 LAB — CBC WITH DIFFERENTIAL/PLATELET
BASO%: 0 % (ref 0.0–2.0)
Basophils Absolute: 0 10*3/uL (ref 0.0–0.1)
EOS%: 0.7 % (ref 0.0–7.0)
Eosinophils Absolute: 0 10*3/uL (ref 0.0–0.5)
HCT: 32.9 % — ABNORMAL LOW (ref 34.8–46.6)
HGB: 11.3 g/dL — ABNORMAL LOW (ref 11.6–15.9)
LYMPH%: 49.8 % — AB (ref 14.0–49.7)
MCH: 30.4 pg (ref 25.1–34.0)
MCHC: 34.3 g/dL (ref 31.5–36.0)
MCV: 88.4 fL (ref 79.5–101.0)
MONO#: 0.6 10*3/uL (ref 0.1–0.9)
MONO%: 20.8 % — ABNORMAL HIGH (ref 0.0–14.0)
NEUT#: 0.8 10*3/uL — ABNORMAL LOW (ref 1.5–6.5)
NEUT%: 28.7 % — ABNORMAL LOW (ref 38.4–76.8)
PLATELETS: 203 10*3/uL (ref 145–400)
RBC: 3.72 10*6/uL (ref 3.70–5.45)
RDW: 20 % — ABNORMAL HIGH (ref 11.2–14.5)
WBC: 2.7 10*3/uL — ABNORMAL LOW (ref 3.9–10.3)
lymph#: 1.3 10*3/uL (ref 0.9–3.3)

## 2015-03-11 LAB — COMPREHENSIVE METABOLIC PANEL (CC13)
ALT: 27 U/L (ref 0–55)
AST: 27 U/L (ref 5–34)
Albumin: 3.8 g/dL (ref 3.5–5.0)
Alkaline Phosphatase: 87 U/L (ref 40–150)
Anion Gap: 9 mEq/L (ref 3–11)
BUN: 8.7 mg/dL (ref 7.0–26.0)
CALCIUM: 9.2 mg/dL (ref 8.4–10.4)
CHLORIDE: 107 meq/L (ref 98–109)
CO2: 26 meq/L (ref 22–29)
CREATININE: 0.7 mg/dL (ref 0.6–1.1)
EGFR: >90 mL/min/{1.73_m2} (ref >90)
Glucose: 133 mg/dl (ref 70–140)
Potassium: 3.4 mEq/L — ABNORMAL LOW (ref 3.5–5.1)
SODIUM: 141 meq/L (ref 136–145)
TOTAL PROTEIN: 6.6 g/dL (ref 6.4–8.3)
Total Bilirubin: 0.59 mg/dL (ref 0.20–1.20)

## 2015-03-11 MED ORDER — FILGRASTIM 300 MCG/0.5ML IJ SOSY: 300.0000 ug | PREFILLED_SYRINGE | Freq: Once | INTRAMUSCULAR | Status: DC

## 2015-03-11 MED ORDER — PROMETHAZINE HCL 25 MG/ML IJ SOLN
25.0000 mg | Freq: Once | INTRAMUSCULAR | Status: DC
  Filled 2015-03-11: qty 1

## 2015-03-11 MED ORDER — TBO-FILGRASTIM 300 MCG/0.5ML ~~LOC~~ SOSY
300.0000 ug | PREFILLED_SYRINGE | Freq: Once | SUBCUTANEOUS | Status: AC
  Administered 2015-03-11: 300 ug via SUBCUTANEOUS
  Filled 2015-03-11: qty 0.5

## 2015-03-11 MED ORDER — ALTEPLASE 2 MG IJ SOLR
2.0000 mg | Freq: Once | INTRAMUSCULAR | Status: DC | PRN
  Filled 2015-03-11: qty 2

## 2015-03-11 MED ORDER — HEPARIN SOD (PORK) LOCK FLUSH 100 UNIT/ML IV SOLN
500.0000 [IU] | Freq: Once | INTRAVENOUS | Status: DC | PRN
Start: 2015-03-11 — End: 2015-03-11
  Filled 2015-03-11: qty 5

## 2015-03-11 MED ORDER — SODIUM CHLORIDE 0.9 % IJ SOLN
10.0000 mL | INTRAMUSCULAR | Status: DC | PRN
  Filled 2015-03-11: qty 10

## 2015-03-11 MED ORDER — HEPARIN SOD (PORK) LOCK FLUSH 100 UNIT/ML IV SOLN
250.0000 [IU] | Freq: Once | INTRAVENOUS | Status: DC | PRN
  Filled 2015-03-11: qty 5

## 2015-03-11 MED ORDER — SODIUM CHLORIDE 0.9 % IV SOLN
Freq: Once | INTRAVENOUS | Status: DC
  Filled 2015-03-11: qty 4

## 2015-03-11 MED ORDER — SODIUM CHLORIDE 0.9 % IV SOLN: Freq: Once | INTRAVENOUS | Status: DC

## 2015-03-11 NOTE — Progress Notes (Signed)
[facility]  [phone] [phone]     ID: [name] [name] DOB: 06/11/1961  MR#: [id number]  WHQ#:[id number]  Patient Care Team: [name], MD as PCP - General [name], MD as Consulting Physician (Obstetrics and Gynecology) [name], MD (Gastroenterology) [name]. [name], MD (Neurology) [name], MD as Consulting Physician (General Surgery) [name], MD as Consulting Physician (Oncology) [name], MD as Attending Physician (Radiation Oncology) [name], RN as Registered Nurse [name], RN as Registered Nurse OTHER MD:  CHIEF COMPLAINT: Triple negative breast cancer  CURRENT TREATMENT: Neoadjuvant chemotherapy  BREAST CANCER HISTORY: From the original intake note:  [name] [name] had bilateral screening mammography at the [facility] 11/21/2013. Breast density was category C. Exam was unremarkable. In January 2016 however the patient palpated a change in her right breast laterally. She contacted her gynecologist, Dr. [name], and he set her up for a unilateral right diagnostic mammography with right breast ultrasonography at [facility] 10/15/2014. There was a focal irregular opacity in the lateral right breast associated with pleomorphic calcifications. This was palpable on exam as a firm non-mobile mass. Ultrasound confirmed a hypoechoic mass in the right breast at the 9:00 position 5 cm from the nipple, measuring 2.9 cm. Next to this mass was a normal-sized intramammary lymph node measuring 4 mm. In the right axilla there were 3 contiguous level I lymph nodes the largest measuring 11 mm.  On 10/20/2014 the patient underwent right core needle biopsy of the 9:00 breast mass, showing ([id number]) and invasive ductal carcinoma, grade 3, triple negative, with the HER-2/neu signals ratio of 1.26 and number per cell 2.15. The MIB-1 was 71%. Biopsy of the largest right axillary lymph node was negative. Dr.  Owens Shark the mammographer who performed a biopsy describes this is concordant.  Left breast mammography 10/26/2014 was negative, and bilateral breast MRI the same day confirmed, in the right breast, and upper outer quadrant mass measuring 2.5 cm, with a second mass measuring 1.4 cm slightly anterior and superior to it. In addition there was a total area of approximately 6.5 cm of non-masslike enhancement extending throughout the upper outer quadrant of the right breast. There was a cortically thickened right axillary lymph node which had been biopsied. There was also a mildly cortically thickened right subpectoral lymph node. There were no internal mammary or left axillary lymph nodes in the left breast was unremarkable.  The patient's subsequent history is as detailed below.  INTERVAL HISTORY: [name] [name] returns today for follow up of her breast cancer. Today she is due for day 1, cycle 7 of 12 of paclitaxel and carboplatin. Last week this dose was held because of pancytopenia. She was given 2 units of blood for symptomatic anemia and feels better today. Unfortunately her [place] is still low.  REVIEW OF SYSTEMS: [name] [name] denies fevers, chills, nausea, vomiting, or changes in bowel or bladder habits. She is eating and drinking well. She has no mouth sores or rashes. Her neuropathy symptoms were intermittent, but not present at all today. Her energy level is good today. She has no shortness of breath, chest pain, cough, or palpitations. She denies headaches, dizziness, or vision changes. A detailed review of systems is otherwise stable.   PAST MEDICAL HISTORY: Past Medical History  Diagnosis Date  . METABOLIC SYNDROME X   . OBESITY, TRUNCAL   . CONSTIPATION, CHRONIC   . KELOID   . Multiple sclerosis   . Allergic rhinitis due to  other allergen   . HYPERTENSION   . HYPERLIPIDEMIA   . GERD   . Angioedema     ACEI  . Colon polyps 05/2014    adenomatous, colo q 5y  . Migraine headache   . Allergy   .  Anxiety   . Breast cancer of upper-outer quadrant of right female breast 10/23/2014  . Hot flashes     PAST SURGICAL HISTORY: Past Surgical History  Procedure Laterality Date  . Tubal ligation    . Colonoscopy    . Portacath placement Left 11/06/2014    Procedure: INSERTION PORT-A-CATH;  Surgeon: [name], MD;  Location: [facility];  Service: General;  Laterality: Left;    FAMILY HISTORY Family History  Problem Relation Age of Onset  . Coronary artery disease Mother   . Heart disease Mother   . Diabetes Mother   . Hypertension Other   . Hyperlipidemia Other   . Diabetes Other   . Colon cancer Neg Hx   . Alcohol abuse Father   . COPD Maternal Grandmother    the patient's father died at the age of 14 following his seizure. The patient's mother died from congestive 70 failure at the age of 87. The patient had 5 brothers and 2 sisters. One brother died from Escherichia coli infection, the other one had a history of seizure disorder. One sister died from causes unknown to the patient. There is no history of breast or ovarian cancer in the family.  GYNECOLOGIC HISTORY:  Patient's last menstrual period was 11/01/2012. Menarche age 76, first live birth age 54. The patient is GX P2. She stopped having periods in May 2014. She did not take hormone replacement. She did take her control pills remotely for approximately 4 years with no complications.  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 She describes herself is single, and lives by herself. Her daughter [name] lives in [place] and works in Ambulance person for [employer]. Son [name] is an Engineer, building services for [employer]: Not in place   HEALTH MAINTENANCE: History  Substance Use Topics  . Smoking status: Never Smoker   . Smokeless tobacco: Never Used     Comment: Single- brother staying with pt since  mom's death  . Alcohol Use: No     Colonoscopy: September 2015/[name]  PAP: February 2015  Bone density:  Lipid panel:  Allergies  Allergen Reactions  . Ace Inhibitors Anaphylaxis    REACTION: Angioedema (tongue swelling)  . Clarithromycin Anaphylaxis    Throat swells  . Levaquin [Levofloxacin] Other (See Comments)    Tendon pain - shoulder and calf    Current Outpatient Prescriptions  Medication Sig Dispense Refill  . Glatiramer Acetate (COPAXONE) 40 MG/ML SOSY Inject 40 mg into the skin 3 (three) times a week. 12 Syringe 0  . hydrochlorothiazide (HYDRODIURIL) 25 MG tablet TAKE 1 TABLET DAILY 90 tablet 3  . lidocaine-prilocaine (EMLA) cream Apply to affected area once 30 g 3  . loratadine-pseudoephedrine (CLARITIN-D 12 HOUR) 5-120 MG per tablet Take 1 tablet by mouth as needed for allergies. 30 tablet 3  . losartan (COZAAR) 50 MG tablet Take 1 tablet (50 mg total) by mouth 2 (two) times daily. 180 tablet 3  . metoprolol (LOPRESSOR) 50 MG tablet TAKE 1 TABLET TWICE A DAY 180 tablet 3  . pantoprazole (PROTONIX) 40 MG tablet Take 1 tablet (40 mg total) by  mouth daily. 90 tablet 1  . potassium chloride (MICRO-K) 10 MEQ CR capsule Take 1 capsule (10 mEq total) by mouth 2 (two) times daily. (Patient taking differently: Take 10 mEq by mouth 2 (two) times daily. Pt taking 2 - 10 mEq tablets 2 times a day. Total dose 40 mEq daily.) 180 capsule 0  . ALPRAZolam (XANAX) 0.5 MG tablet Take 1 tablet (0.5 mg total) by mouth 3 (three) times daily as needed for anxiety or sleep. (Patient not taking: Reported on 02/04/2015) 30 tablet 1  . [DISCONTINUED] mometasone (NASONEX) 50 MCG/ACT nasal spray Place 2 sprays into the nose daily. 17 g 2   Current Facility-Administered Medications  Medication Dose Route Frequency Provider Last Rate Last Dose  . 0.9 %  sodium chloride infusion   Intravenous Once [employer], NP      . alteplase (CATHFLO ACTIVASE) injection 2 mg  2 mg Intracatheter Once PRN  Laurie Panda, NP      . heparin lock flush 100 unit/mL  500 Units Intracatheter Once PRN Laurie Panda, NP      . heparin lock flush 100 unit/mL  250 Units Intracatheter Once PRN Laurie Panda, NP      . ondansetron (ZOFRAN) 8 mg in sodium chloride 0.9 % 50 mL IVPB   Intravenous Once Laurie Panda, NP      . promethazine (PHENERGAN) injection 25 mg  25 mg Intravenous Once [employer]ill [employer], NP      . sodium chloride 0.9 % injection 10 mL  10 mL Intracatheter PRN Laurie Panda, NP        OBJECTIVE: Middle-aged African-American woman in no acute distress Filed Vitals:   03/11/15 1107  BP: 135/67  [name]: 100  Temp: 98.9 F (37.2 C)  Resp: 20     Body mass index is 35.61 kg/(m^2).    ECOG FS:0 - Asymptomatic   Skin: warm, dry  HEENT: sclerae anicteric, conjunctivae pink, oropharynx clear. No thrush or mucositis.  Lymph Nodes: No cervical or supraclavicular lymphadenopathy  Lungs: clear to auscultation bilaterally, no rales, wheezes, or rhonci  Heart: regular rate and rhythm  Abdomen: round, soft, non tender, positive bowel sounds  Musculoskeletal: No focal spinal tenderness, no peripheral edema  Neuro: non focal, well oriented, positive affect  Breasts: deferred  LAB RESULTS:  CMP     Component Value Date/Time   NA 141 03/11/2015 1052   NA 133* 12/31/2014 1102   K 3.4* 03/11/2015 1052   K 2.7* 12/31/2014 1102   CL 97 12/31/2014 1102   CO2 26 03/11/2015 1052   CO2 26 12/31/2014 1102   GLUCOSE 133 03/11/2015 1052   GLUCOSE 129* 12/31/2014 1102   BUN 8.7 03/11/2015 1052   BUN 9 12/31/2014 1102   CREATININE 0.7 03/11/2015 1052   CREATININE 0.82 12/31/2014 1102   CALCIUM 9.2 03/11/2015 1052   CALCIUM 9.0 12/31/2014 1102   PROT 6.6 03/11/2015 1052   PROT 7.3 12/31/2014 1102   ALBUMIN 3.8 03/11/2015 1052   ALBUMIN 4.0 12/31/2014 1102   AST 27 03/11/2015 1052   AST 17 12/31/2014 1102   ALT 27 03/11/2015 1052   ALT 13 12/31/2014 1102   ALKPHOS 87  03/11/2015 1052   ALKPHOS 101 12/31/2014 1102   BILITOT 0.59 03/11/2015 1052   BILITOT 0.4 12/31/2014 1102   GFRNONAA 81* 12/31/2014 1102   GFRAA >90 12/31/2014 1102    INo results found for: SPEP, UPEP  Lab Results  Component Value  Date   WBC 2.7* 03/11/2015   NEUTROABS 0.8* 03/11/2015   HGB 11.3* 03/11/2015   HCT 32.9* 03/11/2015   MCV 88.4 03/11/2015   PLT 203 03/11/2015      Chemistry      Component Value Date/Time   NA 141 03/11/2015 1052   NA 133* 12/31/2014 1102   K 3.4* 03/11/2015 1052   K 2.7* 12/31/2014 1102   CL 97 12/31/2014 1102   CO2 26 03/11/2015 1052   CO2 26 12/31/2014 1102   BUN 8.7 03/11/2015 1052   BUN 9 12/31/2014 1102   CREATININE 0.7 03/11/2015 1052   CREATININE 0.82 12/31/2014 1102      Component Value Date/Time   CALCIUM 9.2 03/11/2015 1052   CALCIUM 9.0 12/31/2014 1102   ALKPHOS 87 03/11/2015 1052   ALKPHOS 101 12/31/2014 1102   AST 27 03/11/2015 1052   AST 17 12/31/2014 1102   ALT 27 03/11/2015 1052   ALT 13 12/31/2014 1102   BILITOT 0.59 03/11/2015 1052   BILITOT 0.4 12/31/2014 1102       No results found for: LABCA2  No components found for: QASTM196  No results for input(s): INR in the last 168 hours.  Urinalysis    Component Value Date/Time   COLORURINE YELLOW 01/14/2014 1206   APPEARANCEUR CLEAR 01/14/2014 1206   LABSPEC <=1.005* 01/14/2014 1206   PHURINE 5.5 01/14/2014 1206   GLUCOSEU NEGATIVE 01/14/2014 1206   HGBUR NEGATIVE 01/14/2014 1206   HGBUR negative 08/19/2010 1041   BILIRUBINUR NEGATIVE 01/14/2014 1206   KETONESUR NEGATIVE 01/14/2014 1206   UROBILINOGEN 0.2 01/14/2014 1206   NITRITE NEGATIVE 01/14/2014 1206   LEUKOCYTESUR NEGATIVE 01/14/2014 1206    STUDIES: No results found.  ASSESSMENT: 54 y.o. 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   2) neoadjuvant chemotherapy to consist of cyclophosphamide and doxorubicin in dose dense fashion 4 given with Neulasta support, completed 01/06/2015, followed by weekly carboplatin and paclitaxel 12  (3) definitive surgery to follow chemotherapy  (4) adjuvant radiation, if appropriate, to follow surgery  PLAN: The labs were reviewed in detail, and while her platelet count and hgb have improved, her [place] creeped up to just 0.8. I consulted with Dr. Jana Hakim, and again he suggests holding this cycle of treatment. To ensure that her counts improve by next week she will receive neupogen today, tomorrow, and Saturday.   Cortne will return in 1 week for a follow up visit with Dr. Jana Hakim, and hopefully the start of cycle 7 of paclitaxel and carboplatin. She understands and agrees with this plan. She knows the goal of treatment in her case is cure. She has been encouraged to call with any issues that might arise before her next visit here.   Laurie Panda, NP   03/11/2015 11:58 AM

## 2015-03-11 NOTE — Progress Notes (Signed)
Granix injection given by desk nurse

## 2015-03-12 ENCOUNTER — Ambulatory Visit (HOSPITAL_BASED_OUTPATIENT_CLINIC_OR_DEPARTMENT_OTHER): Payer: BC Managed Care – PPO

## 2015-03-12 VITALS — BP 138/77 | Temp 99.0°F | Resp 118

## 2015-03-12 DIAGNOSIS — C50811 Malignant neoplasm of overlapping sites of right female breast: Secondary | ICD-10-CM

## 2015-03-12 DIAGNOSIS — D701 Agranulocytosis secondary to cancer chemotherapy: Secondary | ICD-10-CM

## 2015-03-12 DIAGNOSIS — C50411 Malignant neoplasm of upper-outer quadrant of right female breast: Secondary | ICD-10-CM

## 2015-03-12 MED ORDER — TBO-FILGRASTIM 300 MCG/0.5ML ~~LOC~~ SOSY
300.0000 ug | PREFILLED_SYRINGE | Freq: Once | SUBCUTANEOUS | Status: AC
  Administered 2015-03-12: 300 ug via SUBCUTANEOUS
  Filled 2015-03-12: qty 0.5

## 2015-03-12 MED ORDER — SODIUM CHLORIDE 0.9 % IV SOLN: Freq: Once | INTRAVENOUS | Status: DC

## 2015-03-12 MED ORDER — HEPARIN SOD (PORK) LOCK FLUSH 100 UNIT/ML IV SOLN
250.0000 [IU] | Freq: Once | INTRAVENOUS | Status: AC | PRN
  Filled 2015-03-12: qty 5

## 2015-03-12 MED ORDER — SODIUM CHLORIDE 0.9 % IJ SOLN
10.0000 mL | INTRAMUSCULAR | Status: DC | PRN
  Filled 2015-03-12: qty 10

## 2015-03-12 MED ORDER — HEPARIN SOD (PORK) LOCK FLUSH 100 UNIT/ML IV SOLN
500.0000 [IU] | Freq: Once | INTRAVENOUS | Status: AC | PRN
  Filled 2015-03-12: qty 5

## 2015-03-12 MED ORDER — TBO-FILGRASTIM 300 MCG/0.5ML ~~LOC~~ SOSY
300.0000 ug | PREFILLED_SYRINGE | Freq: Once | SUBCUTANEOUS | Status: DC
  Filled 2015-03-12: qty 0.5

## 2015-03-12 MED ORDER — FILGRASTIM 300 MCG/0.5ML IJ SOSY: 300.0000 ug | PREFILLED_SYRINGE | Freq: Once | INTRAMUSCULAR | Status: DC

## 2015-03-12 MED ORDER — PROMETHAZINE HCL 25 MG/ML IJ SOLN: 25.0000 mg | Freq: Once | INTRAMUSCULAR | Status: DC

## 2015-03-12 MED ORDER — ALTEPLASE 2 MG IJ SOLR
2.0000 mg | Freq: Once | INTRAMUSCULAR | Status: AC | PRN
  Filled 2015-03-12: qty 2

## 2015-03-12 NOTE — Progress Notes (Signed)
Day 2 of Granix given at 0856 on 03/12/15.

## 2015-03-13 ENCOUNTER — Ambulatory Visit (HOSPITAL_BASED_OUTPATIENT_CLINIC_OR_DEPARTMENT_OTHER): Payer: BC Managed Care – PPO

## 2015-03-13 DIAGNOSIS — C50411 Malignant neoplasm of upper-outer quadrant of right female breast: Secondary | ICD-10-CM

## 2015-03-13 DIAGNOSIS — C50811 Malignant neoplasm of overlapping sites of right female breast: Secondary | ICD-10-CM

## 2015-03-13 DIAGNOSIS — Z5189 Encounter for other specified aftercare: Secondary | ICD-10-CM | POA: Diagnosis not present

## 2015-03-13 MED ORDER — FILGRASTIM 300 MCG/0.5ML IJ SOSY
300.0000 ug | PREFILLED_SYRINGE | Freq: Once | INTRAMUSCULAR | Status: AC
  Administered 2015-03-13: 300 ug via SUBCUTANEOUS

## 2015-03-17 ENCOUNTER — Ambulatory Visit (HOSPITAL_BASED_OUTPATIENT_CLINIC_OR_DEPARTMENT_OTHER): Payer: BC Managed Care – PPO | Admitting: Oncology

## 2015-03-17 ENCOUNTER — Other Ambulatory Visit (HOSPITAL_BASED_OUTPATIENT_CLINIC_OR_DEPARTMENT_OTHER): Payer: BC Managed Care – PPO

## 2015-03-17 ENCOUNTER — Ambulatory Visit (HOSPITAL_BASED_OUTPATIENT_CLINIC_OR_DEPARTMENT_OTHER): Payer: BC Managed Care – PPO

## 2015-03-17 ENCOUNTER — Telehealth: Payer: Self-pay | Admitting: Oncology

## 2015-03-17 VITALS — BP 125/70 | HR 87 | Temp 98.0°F | Resp 18 | Ht 66.0 in | Wt 217.2 lb

## 2015-03-17 DIAGNOSIS — Z171 Estrogen receptor negative status [ER-]: Secondary | ICD-10-CM

## 2015-03-17 DIAGNOSIS — C50811 Malignant neoplasm of overlapping sites of right female breast: Secondary | ICD-10-CM | POA: Diagnosis not present

## 2015-03-17 DIAGNOSIS — C50411 Malignant neoplasm of upper-outer quadrant of right female breast: Secondary | ICD-10-CM

## 2015-03-17 DIAGNOSIS — I10 Essential (primary) hypertension: Secondary | ICD-10-CM

## 2015-03-17 DIAGNOSIS — G609 Hereditary and idiopathic neuropathy, unspecified: Secondary | ICD-10-CM | POA: Diagnosis not present

## 2015-03-17 DIAGNOSIS — G35 Multiple sclerosis: Secondary | ICD-10-CM

## 2015-03-17 DIAGNOSIS — Z5111 Encounter for antineoplastic chemotherapy: Secondary | ICD-10-CM

## 2015-03-17 LAB — COMPREHENSIVE METABOLIC PANEL (CC13)
ALBUMIN: 3.9 g/dL (ref 3.5–5.0)
ALK PHOS: 93 U/L (ref 40–150)
ALT: 27 U/L (ref 0–55)
AST: 25 U/L (ref 5–34)
Anion Gap: 10 mEq/L (ref 3–11)
BUN: 11.8 mg/dL (ref 7.0–26.0)
CO2: 24 mEq/L (ref 22–29)
CREATININE: 0.7 mg/dL (ref 0.6–1.1)
Calcium: 9.3 mg/dL (ref 8.4–10.4)
Chloride: 105 mEq/L (ref 98–109)
GLUCOSE: 108 mg/dL (ref 70–140)
Potassium: 3.9 mEq/L (ref 3.5–5.1)
Sodium: 139 mEq/L (ref 136–145)
Total Bilirubin: 0.5 mg/dL (ref 0.20–1.20)
Total Protein: 6.6 g/dL (ref 6.4–8.3)

## 2015-03-17 LAB — CBC WITH DIFFERENTIAL/PLATELET
BASO%: 0.1 % (ref 0.0–2.0)
Basophils Absolute: 0 10*3/uL (ref 0.0–0.1)
EOS%: 0.3 % (ref 0.0–7.0)
Eosinophils Absolute: 0 10*3/uL (ref 0.0–0.5)
HCT: 35.1 % (ref 34.8–46.6)
HGB: 11.9 g/dL (ref 11.6–15.9)
LYMPH%: 35.3 % (ref 14.0–49.7)
MCH: 30.2 pg (ref 25.1–34.0)
MCHC: 33.9 g/dL (ref 31.5–36.0)
MCV: 89.1 fL (ref 79.5–101.0)
MONO#: 1.6 10*3/uL — ABNORMAL HIGH (ref 0.1–0.9)
MONO%: 19.9 % — AB (ref 0.0–14.0)
NEUT#: 3.5 10*3/uL (ref 1.5–6.5)
NEUT%: 44.4 % (ref 38.4–76.8)
PLATELETS: 288 10*3/uL (ref 145–400)
RBC: 3.94 10*6/uL (ref 3.70–5.45)
RDW: 19.7 % — ABNORMAL HIGH (ref 11.2–14.5)
WBC: 7.8 10*3/uL (ref 3.9–10.3)
lymph#: 2.8 10*3/uL (ref 0.9–3.3)

## 2015-03-17 MED ORDER — SODIUM CHLORIDE 0.9 % IJ SOLN
10.0000 mL | INTRAMUSCULAR | Status: DC | PRN
  Administered 2015-03-17: 10 mL
  Filled 2015-03-17: qty 10

## 2015-03-17 MED ORDER — DIPHENHYDRAMINE HCL 50 MG/ML IJ SOLN
25.0000 mg | Freq: Once | INTRAMUSCULAR | Status: AC
  Administered 2015-03-17: 25 mg via INTRAVENOUS

## 2015-03-17 MED ORDER — HEPARIN SOD (PORK) LOCK FLUSH 100 UNIT/ML IV SOLN
500.0000 [IU] | Freq: Once | INTRAVENOUS | Status: AC | PRN
  Administered 2015-03-17: 500 [IU]
  Filled 2015-03-17: qty 5

## 2015-03-17 MED ORDER — FAMOTIDINE IN NACL 20-0.9 MG/50ML-% IV SOLN
INTRAVENOUS | Status: AC
  Filled 2015-03-17: qty 50

## 2015-03-17 MED ORDER — FAMOTIDINE IN NACL 20-0.9 MG/50ML-% IV SOLN
20.0000 mg | Freq: Once | INTRAVENOUS | Status: AC
  Administered 2015-03-17: 20 mg via INTRAVENOUS

## 2015-03-17 MED ORDER — DEXTROSE 5 % IV SOLN
68.0000 mg/m2 | Freq: Once | INTRAVENOUS | Status: AC
  Administered 2015-03-17: 144 mg via INTRAVENOUS
  Filled 2015-03-17: qty 24

## 2015-03-17 MED ORDER — SODIUM CHLORIDE 0.9 % IV SOLN
300.0000 mg | Freq: Once | INTRAVENOUS | Status: AC
  Administered 2015-03-17: 300 mg via INTRAVENOUS
  Filled 2015-03-17: qty 30

## 2015-03-17 MED ORDER — SODIUM CHLORIDE 0.9 % IV SOLN
Freq: Once | INTRAVENOUS | Status: AC
  Administered 2015-03-17: 12:00:00 via INTRAVENOUS
  Filled 2015-03-17: qty 8

## 2015-03-17 MED ORDER — DIPHENHYDRAMINE HCL 50 MG/ML IJ SOLN
INTRAMUSCULAR | Status: AC
  Filled 2015-03-17: qty 1

## 2015-03-17 MED ORDER — SODIUM CHLORIDE 0.9 % IV SOLN
Freq: Once | INTRAVENOUS | Status: AC
  Administered 2015-03-17: 12:00:00 via INTRAVENOUS

## 2015-03-17 NOTE — Patient Instructions (Signed)
[facility] Discharge Instructions for Patients Receiving Chemotherapy  Today you received the following chemotherapy agents: Taxol, Carboplatin  To help prevent nausea and vomiting after your treatment, we encourage you to take your nausea medication as prescribed by your physician.   If you develop nausea and vomiting that is not controlled by your nausea medication, call the clinic.   BELOW ARE SYMPTOMS THAT SHOULD BE REPORTED IMMEDIATELY:  *FEVER GREATER THAN 100.5 F  *CHILLS WITH OR WITHOUT FEVER  NAUSEA AND VOMITING THAT IS NOT CONTROLLED WITH YOUR NAUSEA MEDICATION  *UNUSUAL SHORTNESS OF BREATH  *UNUSUAL BRUISING OR BLEEDING  TENDERNESS IN MOUTH AND THROAT WITH OR WITHOUT PRESENCE OF ULCERS  *URINARY PROBLEMS  *BOWEL PROBLEMS  UNUSUAL RASH Items with * indicate a potential emergency and should be followed up as soon as possible.  Feel free to call the clinic you have any questions or concerns. The clinic phone number is [phone].  Please show the CHEMO ALERT CARD at check-in to the Emergency Department and triage nurse.   

## 2015-03-17 NOTE — Telephone Encounter (Signed)
Appointments made and avs printed for patient °

## 2015-03-17 NOTE — Progress Notes (Signed)
[name] [name]  [phone] [phone]     ID: [name] DOB: 11-13-1960  MR#: [id number]  LMB#:[id number]  Patient Care Team: [name], MD as PCP - General [name], MD as Consulting Physician (Obstetrics and Gynecology) [name], MD (Gastroenterology) [name]. [name], MD (Neurology) [name], MD as Consulting Physician (General Surgery) [name], MD as Consulting Physician (Oncology) [name], MD as Attending Physician (Radiation Oncology) [name], RN as Registered Nurse [name], RN as Registered Nurse OTHER MD:  CHIEF COMPLAINT: Triple negative breast cancer  CURRENT TREATMENT: Neoadjuvant chemotherapy  BREAST CANCER HISTORY: From the original intake note:  [name] [name] had bilateral screening mammography at the [facility] 11/21/2013. Breast density was category C. Exam was unremarkable. In January 2016 however the patient palpated a change in her right breast laterally. She contacted her gynecologist, Dr. [name], and he set her up for a unilateral right diagnostic mammography with right breast ultrasonography at [facility] 10/15/2014. There was a focal irregular opacity in the lateral right breast associated with pleomorphic calcifications. This was palpable on exam as a firm non-mobile mass. Ultrasound confirmed a hypoechoic mass in the right breast at the 9:00 position 5 cm from the nipple, measuring 2.9 cm. Next to this mass was a normal-sized intramammary lymph node measuring 4 mm. In the right axilla there were 3 contiguous level I lymph nodes the largest measuring 11 mm.  On 10/20/2014 the patient underwent right core needle biopsy of the 9:00 breast mass, showing ([id number]) and invasive ductal carcinoma, grade 3, triple negative, with the HER-2/neu signals ratio of 1.26 and number per cell 2.15. The MIB-1 was 71%. Biopsy of the largest right axillary lymph node was negative. Dr.  Owens Shark the mammographer who performed a biopsy describes this is concordant.  Left breast mammography 10/26/2014 was negative, and bilateral breast MRI the same day confirmed, in the right breast, and upper outer quadrant mass measuring 2.5 cm, with a second mass measuring 1.4 cm slightly anterior and superior to it. In addition there was a total area of approximately 6.5 cm of non-masslike enhancement extending throughout the upper outer quadrant of the right breast. There was a cortically thickened right axillary lymph node which had been biopsied. There was also a mildly cortically thickened right subpectoral lymph node. There were no internal mammary or left axillary lymph nodes in the left breast was unremarkable.  The patient's subsequent history is as detailed below.  INTERVAL HISTORY: [name] [name] returns today for follow up of her breast cancer. Today day 1, cycle 7 of 12 planned cycles of paclitaxel and carboplatin, given weekly. She actually has not been treated since June 9 because of persistent neutropenia. Last week she received 3 doses of Neulasta and that has thankfully corrected her counts. She had no side effects from those treatments  REVIEW OF SYSTEMS: [name] [name] is doing "great". When asked specifically about neuropathy she tells me both big toes are little bit numb. She has a little bit of numbness in the fingertips of the first 2 digits of her right hand. She tells me she always had problems with the third and fourth digits of her right hand. That is not changed from baseline. There is no neuropathy at all in the left hand. Needless to say this is a very unusual pattern of neuropathy if it is due to neuropathy. Aside from these issues a detailed review of systems today was benign  PAST MEDICAL HISTORY:  Past Medical History  Diagnosis Date  . METABOLIC SYNDROME X   . OBESITY, TRUNCAL   . CONSTIPATION, CHRONIC   . KELOID   . Multiple sclerosis   . Allergic rhinitis due to other allergen     . HYPERTENSION   . HYPERLIPIDEMIA   . GERD   . Angioedema     ACEI  . Colon polyps 05/2014    adenomatous, colo q 5y  . Migraine headache   . Allergy   . Anxiety   . Breast cancer of upper-outer quadrant of right female breast 10/23/2014  . Hot flashes     PAST SURGICAL HISTORY: Past Surgical History  Procedure Laterality Date  . Tubal ligation    . Colonoscopy    . Portacath placement Left 11/06/2014    Procedure: INSERTION PORT-A-CATH;  Surgeon: [name], MD;  Location: [facility];  Service: General;  Laterality: Left;    FAMILY HISTORY Family History  Problem Relation Age of Onset  . Coronary artery disease Mother   . Heart disease Mother   . Diabetes Mother   . Hypertension Other   . Hyperlipidemia Other   . Diabetes Other   . Colon cancer Neg Hx   . Alcohol abuse Father   . COPD Maternal Grandmother    the patient's father died at the age of 34 following his seizure. The patient's mother died from congestive 10 failure at the age of 28. The patient had 5 brothers and 2 sisters. One brother died from Escherichia coli infection, the other one had a history of seizure disorder. One sister died from causes unknown to the patient. There is no history of breast or ovarian cancer in the family.  GYNECOLOGIC HISTORY:  Patient's last menstrual period was 11/01/2012. Menarche age 54, first live birth age 19. The patient is GX P2. She stopped having periods in May 2014. She did not take hormone replacement. She did take her control pills remotely for approximately 4 years with no complications.  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 She describes herself is single, and lives by herself. Her daughter [name] lives in [place] and works in Ambulance person for [employer]. Son Katonah "[name]" Deist is an Engineer, building services for [employer]: Not in place   HEALTH  MAINTENANCE: History  Substance Use Topics  . Smoking status: Never Smoker   . Smokeless tobacco: Never Used     Comment: Single- brother staying with pt since mom's death  . Alcohol Use: No     Colonoscopy: September 2015/[name]  PAP: February 2015  Bone density:  Lipid panel:  Allergies  Allergen Reactions  . Ace Inhibitors Anaphylaxis    REACTION: Angioedema (tongue swelling)  . Clarithromycin Anaphylaxis    Throat swells  . Levaquin [Levofloxacin] Other (See Comments)    Tendon pain - shoulder and calf    Current Outpatient Prescriptions  Medication Sig Dispense Refill  . ALPRAZolam (XANAX) 0.5 MG tablet Take 1 tablet (0.5 mg total) by mouth 3 (three) times daily as needed for anxiety or sleep. (Patient not taking: Reported on 02/04/2015) 30 tablet 1  . Glatiramer Acetate (COPAXONE) 40 MG/ML SOSY Inject 40 mg into the skin 3 (three) times a week. 12 Syringe 0  . hydrochlorothiazide (HYDRODIURIL) 25 MG tablet TAKE 1 TABLET DAILY 90 tablet 3  . lidocaine-prilocaine (EMLA) cream Apply to affected area once 30 g 3  .  loratadine-pseudoephedrine (CLARITIN-D 12 HOUR) 5-120 MG per tablet Take 1 tablet by mouth as needed for allergies. 30 tablet 3  . losartan (COZAAR) 50 MG tablet Take 1 tablet (50 mg total) by mouth 2 (two) times daily. 180 tablet 3  . metoprolol (LOPRESSOR) 50 MG tablet TAKE 1 TABLET TWICE A DAY 180 tablet 3  . pantoprazole (PROTONIX) 40 MG tablet Take 1 tablet (40 mg total) by mouth daily. 90 tablet 1  . potassium chloride (MICRO-K) 10 MEQ CR capsule Take 1 capsule (10 mEq total) by mouth 2 (two) times daily. (Patient taking differently: Take 10 mEq by mouth 2 (two) times daily. Pt taking 2 - 10 mEq tablets 2 times a day. Total dose 40 mEq daily.) 180 capsule 0  . [DISCONTINUED] mometasone (NASONEX) 50 MCG/ACT nasal spray Place 2 sprays into the nose daily. 17 g 2   No current facility-administered medications for this visit.   Facility-Administered  Medications Ordered in Other Visits  Medication Dose Route Frequency Provider Last Rate Last Dose  . sodium chloride 0.9 % injection 10 mL  10 mL Intracatheter PRN Sheffield Slider, NP        OBJECTIVE: Middle-aged African-American woman who appears stated age 54 Vitals:   03/17/15 1006  BP: 125/70  [name]: 87  Temp: 98 F (36.7 C)  Resp: 18     Body mass index is 35.07 kg/(m^2).    ECOG FS:0 - Asymptomatic   Sclerae unicteric, pupils round and equal Oropharynx clear and moist-- no thrush or other lesions No cervical or supraclavicular adenopathy Lungs no rales or rhonchi Heart regular rate and rhythm Abd soft, obese, nontender, positive bowel sounds MSK no focal spinal tenderness, no upper extremity lymphedema Neuro: nonfocal, well oriented, appropriate affect Breasts: Deferred   LAB RESULTS:  CMP     Component Value Date/Time   NA 139 03/17/2015 0957   NA 133* 12/31/2014 1102   K 3.9 03/17/2015 0957   K 2.7* 12/31/2014 1102   CL 97 12/31/2014 1102   CO2 24 03/17/2015 0957   CO2 26 12/31/2014 1102   GLUCOSE 108 03/17/2015 0957   GLUCOSE 129* 12/31/2014 1102   BUN 11.8 03/17/2015 0957   BUN 9 12/31/2014 1102   CREATININE 0.7 03/17/2015 0957   CREATININE 0.82 12/31/2014 1102   CALCIUM 9.3 03/17/2015 0957   CALCIUM 9.0 12/31/2014 1102   PROT 6.6 03/17/2015 0957   PROT 7.3 12/31/2014 1102   ALBUMIN 3.9 03/17/2015 0957   ALBUMIN 4.0 12/31/2014 1102   AST 25 03/17/2015 0957   AST 17 12/31/2014 1102   ALT 27 03/17/2015 0957   ALT 13 12/31/2014 1102   ALKPHOS 93 03/17/2015 0957   ALKPHOS 101 12/31/2014 1102   BILITOT 0.50 03/17/2015 0957   BILITOT 0.4 12/31/2014 1102   GFRNONAA 81* 12/31/2014 1102   GFRAA >90 12/31/2014 1102    INo results found for: SPEP, UPEP  Lab Results  Component Value Date   WBC 7.8 03/17/2015   NEUTROABS 3.5 03/17/2015   HGB 11.9 03/17/2015   HCT 35.1 03/17/2015   MCV 89.1 03/17/2015   PLT 288 03/17/2015      Chemistry       Component Value Date/Time   NA 139 03/17/2015 0957   NA 133* 12/31/2014 1102   K 3.9 03/17/2015 0957   K 2.7* 12/31/2014 1102   CL 97 12/31/2014 1102   CO2 24 03/17/2015 0957   CO2 26 12/31/2014 1102   BUN 11.8 03/17/2015 0957  BUN 9 12/31/2014 1102   CREATININE 0.7 03/17/2015 0957   CREATININE 0.82 12/31/2014 1102      Component Value Date/Time   CALCIUM 9.3 03/17/2015 0957   CALCIUM 9.0 12/31/2014 1102   ALKPHOS 93 03/17/2015 0957   ALKPHOS 101 12/31/2014 1102   AST 25 03/17/2015 0957   AST 17 12/31/2014 1102   ALT 27 03/17/2015 0957   ALT 13 12/31/2014 1102   BILITOT 0.50 03/17/2015 0957   BILITOT 0.4 12/31/2014 1102       No results found for: LABCA2  No components found for: LABCA125  No results for input(s): INR in the last 168 hours.  Urinalysis    Component Value Date/Time   COLORURINE YELLOW 01/14/2014 1206   APPEARANCEUR CLEAR 01/14/2014 1206   LABSPEC <=1.005* 01/14/2014 1206   PHURINE 5.5 01/14/2014 1206   GLUCOSEU NEGATIVE 01/14/2014 1206   HGBUR NEGATIVE 01/14/2014 1206   HGBUR negative 08/19/2010 1041   BILIRUBINUR NEGATIVE 01/14/2014 1206   KETONESUR NEGATIVE 01/14/2014 1206   UROBILINOGEN 0.2 01/14/2014 1206   NITRITE NEGATIVE 01/14/2014 1206   LEUKOCYTESUR NEGATIVE 01/14/2014 1206    STUDIES: No results found.  ASSESSMENT: 54 y.o. 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   2) neoadjuvant chemotherapy to consist of cyclophosphamide  and doxorubicin in dose dense fashion 4 given with Neulasta support, completed 01/06/2015, followed by weekly carboplatin and paclitaxel 12  (3) definitive surgery to follow chemotherapy  (4) adjuvant radiation, if appropriate, to follow surgery  PLAN: Yariah tolerated the Neupogen well and it has worked fine for her. We are going to have to do this after each chemotherapy dose if we will keep her treatments on schedule. This is a nuisance for her in terms of coming here, but she does not mind doing it so long as it continues to work.  She does have some neuropathy but it is very irregular. It involves the big toes, and the first 2 digits of the right hand. It does not involve the left hand at all. She says she had previous neuropathy in the third and fourth digits of the right hand and that has not changed. Overall I think we will have to take one treatment at a time. She will see [place] again in a week and we will see at that time whether we can continue to treat or not. Certainly what we do not want to do is cause or permanent peripheral neuropathy problems that may affect her quality of life long term  She has a good understanding of this plan. She knows to call for any other problems that may develop before her next visit here.   [name], MD   03/17/2015 11:01 AM

## 2015-03-18 ENCOUNTER — Other Ambulatory Visit: Payer: BC Managed Care – PPO

## 2015-03-18 ENCOUNTER — Ambulatory Visit (HOSPITAL_BASED_OUTPATIENT_CLINIC_OR_DEPARTMENT_OTHER): Payer: BC Managed Care – PPO

## 2015-03-18 ENCOUNTER — Ambulatory Visit: Payer: BC Managed Care – PPO

## 2015-03-18 ENCOUNTER — Ambulatory Visit: Payer: BC Managed Care – PPO | Admitting: Physician Assistant

## 2015-03-18 VITALS — BP 135/81 | HR 97 | Temp 98.3°F

## 2015-03-18 DIAGNOSIS — C50811 Malignant neoplasm of overlapping sites of right female breast: Secondary | ICD-10-CM

## 2015-03-18 DIAGNOSIS — D709 Neutropenia, unspecified: Secondary | ICD-10-CM | POA: Diagnosis not present

## 2015-03-18 DIAGNOSIS — C50411 Malignant neoplasm of upper-outer quadrant of right female breast: Secondary | ICD-10-CM

## 2015-03-18 MED ORDER — TBO-FILGRASTIM 300 MCG/0.5ML ~~LOC~~ SOSY
300.0000 ug | PREFILLED_SYRINGE | Freq: Once | SUBCUTANEOUS | Status: AC
  Administered 2015-03-18: 300 ug via SUBCUTANEOUS
  Filled 2015-03-18: qty 0.5

## 2015-03-19 ENCOUNTER — Encounter: Payer: Self-pay | Admitting: *Deleted

## 2015-03-19 ENCOUNTER — Ambulatory Visit (HOSPITAL_BASED_OUTPATIENT_CLINIC_OR_DEPARTMENT_OTHER): Payer: BC Managed Care – PPO

## 2015-03-19 VITALS — BP 124/67 | HR 87 | Temp 98.5°F

## 2015-03-19 DIAGNOSIS — C50811 Malignant neoplasm of overlapping sites of right female breast: Secondary | ICD-10-CM

## 2015-03-19 DIAGNOSIS — D709 Neutropenia, unspecified: Secondary | ICD-10-CM

## 2015-03-19 DIAGNOSIS — C50411 Malignant neoplasm of upper-outer quadrant of right female breast: Secondary | ICD-10-CM

## 2015-03-19 MED ORDER — TBO-FILGRASTIM 300 MCG/0.5ML ~~LOC~~ SOSY
300.0000 ug | PREFILLED_SYRINGE | Freq: Once | SUBCUTANEOUS | Status: AC
  Administered 2015-03-19: 300 ug via SUBCUTANEOUS
  Filled 2015-03-19: qty 0.5

## 2015-03-20 ENCOUNTER — Ambulatory Visit (HOSPITAL_BASED_OUTPATIENT_CLINIC_OR_DEPARTMENT_OTHER): Payer: BC Managed Care – PPO

## 2015-03-20 VITALS — BP 135/77 | HR 91 | Temp 91.0°F

## 2015-03-20 DIAGNOSIS — Z5189 Encounter for other specified aftercare: Secondary | ICD-10-CM

## 2015-03-20 DIAGNOSIS — C50411 Malignant neoplasm of upper-outer quadrant of right female breast: Secondary | ICD-10-CM

## 2015-03-20 DIAGNOSIS — C50811 Malignant neoplasm of overlapping sites of right female breast: Secondary | ICD-10-CM

## 2015-03-20 MED ORDER — TBO-FILGRASTIM 300 MCG/0.5ML ~~LOC~~ SOSY
300.0000 ug | PREFILLED_SYRINGE | Freq: Once | SUBCUTANEOUS | Status: AC
  Administered 2015-03-20: 300 ug via SUBCUTANEOUS

## 2015-03-23 ENCOUNTER — Other Ambulatory Visit: Payer: Self-pay | Admitting: Nurse Practitioner

## 2015-03-25 ENCOUNTER — Encounter: Payer: Self-pay | Admitting: Nurse Practitioner

## 2015-03-25 ENCOUNTER — Ambulatory Visit (HOSPITAL_BASED_OUTPATIENT_CLINIC_OR_DEPARTMENT_OTHER): Payer: BC Managed Care – PPO

## 2015-03-25 ENCOUNTER — Ambulatory Visit (HOSPITAL_BASED_OUTPATIENT_CLINIC_OR_DEPARTMENT_OTHER): Payer: BC Managed Care – PPO | Admitting: Nurse Practitioner

## 2015-03-25 ENCOUNTER — Telehealth: Payer: Self-pay | Admitting: Nurse Practitioner

## 2015-03-25 ENCOUNTER — Encounter: Payer: Self-pay | Admitting: *Deleted

## 2015-03-25 ENCOUNTER — Other Ambulatory Visit: Payer: Self-pay | Admitting: *Deleted

## 2015-03-25 ENCOUNTER — Other Ambulatory Visit (HOSPITAL_BASED_OUTPATIENT_CLINIC_OR_DEPARTMENT_OTHER): Payer: BC Managed Care – PPO

## 2015-03-25 VITALS — BP 111/72 | HR 95 | Temp 98.1°F | Resp 18 | Ht 66.0 in | Wt 218.4 lb

## 2015-03-25 DIAGNOSIS — C50811 Malignant neoplasm of overlapping sites of right female breast: Secondary | ICD-10-CM

## 2015-03-25 DIAGNOSIS — I10 Essential (primary) hypertension: Secondary | ICD-10-CM

## 2015-03-25 DIAGNOSIS — C50411 Malignant neoplasm of upper-outer quadrant of right female breast: Secondary | ICD-10-CM

## 2015-03-25 DIAGNOSIS — G35 Multiple sclerosis: Secondary | ICD-10-CM

## 2015-03-25 DIAGNOSIS — Z5111 Encounter for antineoplastic chemotherapy: Secondary | ICD-10-CM | POA: Diagnosis not present

## 2015-03-25 LAB — CBC WITH DIFFERENTIAL/PLATELET
BASO%: 0.5 % (ref 0.0–2.0)
Basophils Absolute: 0 10*3/uL (ref 0.0–0.1)
EOS%: 0.9 % (ref 0.0–7.0)
Eosinophils Absolute: 0 10*3/uL (ref 0.0–0.5)
HCT: 34.5 % — ABNORMAL LOW (ref 34.8–46.6)
HGB: 11.5 g/dL — ABNORMAL LOW (ref 11.6–15.9)
LYMPH%: 29 % (ref 14.0–49.7)
MCH: 30.2 pg (ref 25.1–34.0)
MCHC: 33.3 g/dL (ref 31.5–36.0)
MCV: 90.7 fL (ref 79.5–101.0)
MONO#: 0.7 10*3/uL (ref 0.1–0.9)
MONO%: 12.7 % (ref 0.0–14.0)
NEUT#: 3 10*3/uL (ref 1.5–6.5)
NEUT%: 56.9 % (ref 38.4–76.8)
Platelets: 293 10*3/uL (ref 145–400)
RBC: 3.8 10*6/uL (ref 3.70–5.45)
RDW: 21.6 % — ABNORMAL HIGH (ref 11.2–14.5)
WBC: 5.3 10*3/uL (ref 3.9–10.3)
lymph#: 1.5 10*3/uL (ref 0.9–3.3)

## 2015-03-25 LAB — COMPREHENSIVE METABOLIC PANEL (CC13)
ALK PHOS: 96 U/L (ref 40–150)
ALT: 20 U/L (ref 0–55)
AST: 18 U/L (ref 5–34)
Albumin: 3.8 g/dL (ref 3.5–5.0)
Anion Gap: 11 mEq/L (ref 3–11)
BILIRUBIN TOTAL: 0.48 mg/dL (ref 0.20–1.20)
BUN: 9.2 mg/dL (ref 7.0–26.0)
CO2: 24 mEq/L (ref 22–29)
Calcium: 9.8 mg/dL (ref 8.4–10.4)
Chloride: 106 mEq/L (ref 98–109)
Creatinine: 0.8 mg/dL (ref 0.6–1.1)
EGFR: >90 mL/min/{1.73_m2} (ref >90)
Glucose: 131 mg/dl (ref 70–140)
Potassium: 3.6 mEq/L (ref 3.5–5.1)
SODIUM: 141 meq/L (ref 136–145)
Total Protein: 6.5 g/dL (ref 6.4–8.3)

## 2015-03-25 MED ORDER — FAMOTIDINE IN NACL 20-0.9 MG/50ML-% IV SOLN
20.0000 mg | Freq: Once | INTRAVENOUS | Status: AC
  Administered 2015-03-25: 20 mg via INTRAVENOUS

## 2015-03-25 MED ORDER — DEXTROSE 5 % IV SOLN
68.0000 mg/m2 | Freq: Once | INTRAVENOUS | Status: AC
  Administered 2015-03-25: 144 mg via INTRAVENOUS
  Filled 2015-03-25: qty 24

## 2015-03-25 MED ORDER — DIPHENHYDRAMINE HCL 50 MG/ML IJ SOLN
25.0000 mg | Freq: Once | INTRAMUSCULAR | Status: AC
  Administered 2015-03-25: 25 mg via INTRAVENOUS

## 2015-03-25 MED ORDER — DIPHENHYDRAMINE HCL 50 MG/ML IJ SOLN
INTRAMUSCULAR | Status: AC
Start: 2015-03-25 — End: 2015-03-25
  Filled 2015-03-25: qty 1

## 2015-03-25 MED ORDER — HEPARIN SOD (PORK) LOCK FLUSH 100 UNIT/ML IV SOLN
500.0000 [IU] | Freq: Once | INTRAVENOUS | Status: AC | PRN
  Administered 2015-03-25: 500 [IU]
  Filled 2015-03-25: qty 5

## 2015-03-25 MED ORDER — SODIUM CHLORIDE 0.9 % IJ SOLN
10.0000 mL | INTRAMUSCULAR | Status: DC | PRN
  Administered 2015-03-25: 10 mL
  Filled 2015-03-25: qty 10

## 2015-03-25 MED ORDER — FAMOTIDINE IN NACL 20-0.9 MG/50ML-% IV SOLN
INTRAVENOUS | Status: AC
  Filled 2015-03-25: qty 50

## 2015-03-25 MED ORDER — SODIUM CHLORIDE 0.9 % IV SOLN
Freq: Once | INTRAVENOUS | Status: AC
  Administered 2015-03-25: 10:00:00 via INTRAVENOUS

## 2015-03-25 MED ORDER — SODIUM CHLORIDE 0.9 % IV SOLN
300.0000 mg | Freq: Once | INTRAVENOUS | Status: AC
  Administered 2015-03-25: 300 mg via INTRAVENOUS
  Filled 2015-03-25: qty 30

## 2015-03-25 MED ORDER — SODIUM CHLORIDE 0.9 % IV SOLN
Freq: Once | INTRAVENOUS | Status: AC
  Administered 2015-03-25: 10:00:00 via INTRAVENOUS
  Filled 2015-03-25: qty 8

## 2015-03-25 NOTE — Patient Instructions (Signed)
[facility] Discharge Instructions for Patients Receiving Chemotherapy  Today you received the following chemotherapy agents Taxol/Carboplatin To help prevent nausea and vomiting after your treatment, we encourage you to take your nausea medication as prescribed.   If you develop nausea and vomiting that is not controlled by your nausea medication, call the clinic.   BELOW ARE SYMPTOMS THAT SHOULD BE REPORTED IMMEDIATELY:  *FEVER GREATER THAN 100.5 F  *CHILLS WITH OR WITHOUT FEVER  NAUSEA AND VOMITING THAT IS NOT CONTROLLED WITH YOUR NAUSEA MEDICATION  *UNUSUAL SHORTNESS OF BREATH  *UNUSUAL BRUISING OR BLEEDING  TENDERNESS IN MOUTH AND THROAT WITH OR WITHOUT PRESENCE OF ULCERS  *URINARY PROBLEMS  *BOWEL PROBLEMS  UNUSUAL RASH Items with * indicate a potential emergency and should be followed up as soon as possible.  Feel free to call the clinic you have any questions or concerns. The clinic phone number is [phone].  Please show the CHEMO ALERT CARD at check-in to the Emergency Department and triage nurse.   

## 2015-03-25 NOTE — Progress Notes (Signed)
[place]  [phone] [phone]) [phone]     ID: [name] [name] DOB: [birth year]  MR#: [id number]  WNI#:[id number]  Patient Care Team: [name], MD as PCP - General [name], MD as Consulting Physician (Obstetrics and Gynecology) [name], MD (Gastroenterology) [name]. [name], MD (Neurology) [name], MD as Consulting Physician (General Surgery) [name], MD as Consulting Physician (Oncology) [name], MD as Attending Physician (Radiation Oncology) [name], RN as Registered Nurse [name], RN as Registered Nurse OTHER MD:  CHIEF COMPLAINT: Triple negative breast cancer  CURRENT TREATMENT: Neoadjuvant chemotherapy  BREAST CANCER HISTORY: From the original intake note:  [name] [name] had bilateral screening mammography at the [facility] 11/21/2013. Breast density was category C. Exam was unremarkable. In January 2016 however the patient palpated a change in her right breast laterally. She contacted her gynecologist, Dr. [name], and he set her up for a unilateral right diagnostic mammography with right breast ultrasonography at [facility] 10/15/2014. There was a focal irregular opacity in the lateral right breast associated with pleomorphic calcifications. This was palpable on exam as a firm non-mobile mass. Ultrasound confirmed a hypoechoic mass in the right breast at the 9:00 position 5 cm from the nipple, measuring 2.9 cm. Next to this mass was a normal-sized intramammary lymph node measuring 4 mm. In the right axilla there were 3 contiguous level I lymph nodes the largest measuring 11 mm.  On 10/20/2014 the patient underwent right core needle biopsy of the 9:00 breast mass, showing ([id number]) and invasive ductal carcinoma, grade 3, triple negative, with the HER-2/neu signals ratio of 1.26 and number per cell 2.15. The MIB-1 was 71%. Biopsy of the largest right axillary lymph node was negative. Dr.  Owens Shark the mammographer who performed a biopsy describes this is concordant.  Left breast mammography 10/26/2014 was negative, and bilateral breast MRI the same day confirmed, in the right breast, and upper outer quadrant mass measuring 2.5 cm, with a second mass measuring 1.4 cm slightly anterior and superior to it. In addition there was a total area of approximately 6.5 cm of non-masslike enhancement extending throughout the upper outer quadrant of the right breast. There was a cortically thickened right axillary lymph node which had been biopsied. There was also a mildly cortically thickened right subpectoral lymph node. There were no internal mammary or left axillary lymph nodes in the left breast was unremarkable.  The patient's subsequent history is as detailed below.  INTERVAL HISTORY: [name] [name] returns today for follow up of her breast cancer. Today day 1, cycle 8 of 12 planned cycles of paclitaxel and carboplatin, given weekly. We have recently implemented neupogen injections for 3 days after treatment because of persistent neutropenias.  REVIEW OF SYSTEMS: [name] [name] is doing well today. She has no complaints to offer. Her neuropathy is stable, still present in her great toes and to a few fingers in the right hand. A detailed review of systems is otherwise entirely negative.   PAST MEDICAL HISTORY: Past Medical History  Diagnosis Date  . METABOLIC SYNDROME X   . OBESITY, TRUNCAL   . CONSTIPATION, CHRONIC   . KELOID   . Multiple sclerosis   . Allergic rhinitis due to other allergen   . HYPERTENSION   . HYPERLIPIDEMIA   . GERD   . Angioedema     ACEI  . Colon polyps 05/2014    adenomatous, colo q 5y  . Migraine headache   . Allergy   .  Anxiety   . Breast cancer of upper-outer quadrant of right female breast 10/23/2014  . Hot flashes     PAST SURGICAL HISTORY: Past Surgical History  Procedure Laterality Date  . Tubal ligation    . Colonoscopy    . Portacath placement Left 11/06/2014      Procedure: INSERTION PORT-A-CATH;  Surgeon: [name], MD;  Location: [facility];  Service: General;  Laterality: Left;    FAMILY HISTORY Family History  Problem Relation Age of Onset  . Coronary artery disease Mother   . Heart disease Mother   . Diabetes Mother   . Hypertension Other   . Hyperlipidemia Other   . Diabetes Other   . Colon cancer Neg Hx   . Alcohol abuse Father   . COPD Maternal Grandmother    the patient's father died at the age of 12 following his seizure. The patient's mother died from congestive 64 failure at the age of 39. The patient had 5 brothers and 2 sisters. One brother died from Escherichia coli infection, the other one had a history of seizure disorder. One sister died from causes unknown to the patient. There is no history of breast or ovarian cancer in the family.  GYNECOLOGIC HISTORY:  Patient's last menstrual period was 11/01/2012. Menarche age 39, first live birth age 68. The patient is GX P2. She stopped having periods in May 2014. She did not take hormone replacement. She did take her control pills remotely for approximately 4 years with no complications.  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 Her daughter [name] [name] lives in [place] and works in Ambulance person for [employer]. Son [name] [name] is an Engineer, building services for [employer]: Not in place   HEALTH MAINTENANCE: History  Substance Use Topics  . Smoking status: Never Smoker   . Smokeless tobacco: Never Used     Comment: Single- brother staying with pt since mom's death  . Alcohol Use: No     Colonoscopy: September 2015/[name]  PAP: February 2015  Bone density:  Lipid panel:  Allergies  Allergen Reactions  . Ace Inhibitors Anaphylaxis    REACTION: Angioedema (tongue swelling)  . Clarithromycin  Anaphylaxis    Throat swells  . Levaquin [Levofloxacin] Other (See Comments)    Tendon pain - shoulder and calf    Current Outpatient Prescriptions  Medication Sig Dispense Refill  . ALPRAZolam (XANAX) 0.5 MG tablet Take 1 tablet (0.5 mg total) by mouth 3 (three) times daily as needed for anxiety or sleep. 30 tablet 1  . Glatiramer Acetate (COPAXONE) 40 MG/ML SOSY Inject 40 mg into the skin 3 (three) times a week. 12 Syringe 0  . hydrochlorothiazide (HYDRODIURIL) 25 MG tablet TAKE 1 TABLET DAILY 90 tablet 3  . lidocaine-prilocaine (EMLA) cream Apply to affected area once 30 g 3  . loratadine-pseudoephedrine (CLARITIN-D 12 HOUR) 5-120 MG per tablet Take 1 tablet by mouth as needed for allergies. 30 tablet 3  . losartan (COZAAR) 50 MG tablet Take 1 tablet (50 mg total) by mouth 2 (two) times daily. 180 tablet 3  . metoprolol (LOPRESSOR) 50 MG tablet TAKE 1 TABLET TWICE A DAY 180 tablet 3  . pantoprazole (PROTONIX) 40 MG tablet Take 1 tablet (40 mg total) by mouth daily. 90 tablet 1  . potassium chloride (MICRO-K) 10 MEQ CR capsule Take 1 capsule (  10 mEq total) by mouth 2 (two) times daily. (Patient taking differently: Take 10 mEq by mouth 2 (two) times daily. Pt taking 2 - 10 mEq tablets 2 times a day. Total dose 40 mEq daily.) 180 capsule 0  . [DISCONTINUED] mometasone (NASONEX) 50 MCG/ACT nasal spray Place 2 sprays into the nose daily. 17 g 2   No current facility-administered medications for this visit.   Facility-Administered Medications Ordered in Other Visits  Medication Dose Route Frequency Provider Last Rate Last Dose  . sodium chloride 0.9 % injection 10 mL  10 mL Intracatheter PRN Sheffield Slider, NP        OBJECTIVE: Middle-aged African-American woman who appears stated age 54 Vitals:   03/25/15 0844  BP: 111/72  [name]: 95  Temp: 98.1 F (36.7 C)  Resp: 18     Body mass index is 35.27 kg/(m^2).    ECOG FS:0 - Asymptomatic   Skin: warm, dry  HEENT: sclerae anicteric,  conjunctivae pink, oropharynx clear. No thrush or mucositis.  Lymph Nodes: No cervical or supraclavicular lymphadenopathy  Lungs: clear to auscultation bilaterally, no rales, wheezes, or rhonci  Heart: regular rate and rhythm  Abdomen: round, soft, non tender, positive bowel sounds  Musculoskeletal: No focal spinal tenderness, no peripheral edema  Neuro: non focal, well oriented, positive affect  Breasts: deferred  LAB RESULTS:  CMP     Component Value Date/Time   NA 141 03/25/2015 0817   NA 133* 12/31/2014 1102   K 3.6 03/25/2015 0817   K 2.7* 12/31/2014 1102   CL 97 12/31/2014 1102   CO2 24 03/25/2015 0817   CO2 26 12/31/2014 1102   GLUCOSE 131 03/25/2015 0817   GLUCOSE 129* 12/31/2014 1102   BUN 9.2 03/25/2015 0817   BUN 9 12/31/2014 1102   CREATININE 0.8 03/25/2015 0817   CREATININE 0.82 12/31/2014 1102   CALCIUM 9.8 03/25/2015 0817   CALCIUM 9.0 12/31/2014 1102   PROT 6.5 03/25/2015 0817   PROT 7.3 12/31/2014 1102   ALBUMIN 3.8 03/25/2015 0817   ALBUMIN 4.0 12/31/2014 1102   AST 18 03/25/2015 0817   AST 17 12/31/2014 1102   ALT 20 03/25/2015 0817   ALT 13 12/31/2014 1102   ALKPHOS 96 03/25/2015 0817   ALKPHOS 101 12/31/2014 1102   BILITOT 0.48 03/25/2015 0817   BILITOT 0.4 12/31/2014 1102   GFRNONAA 81* 12/31/2014 1102   GFRAA >90 12/31/2014 1102    INo results found for: SPEP, UPEP  Lab Results  Component Value Date   WBC 5.3 03/25/2015   NEUTROABS 3.0 03/25/2015   HGB 11.5* 03/25/2015   HCT 34.5* 03/25/2015   MCV 90.7 03/25/2015   PLT 293 03/25/2015      Chemistry      Component Value Date/Time   NA 141 03/25/2015 0817   NA 133* 12/31/2014 1102   K 3.6 03/25/2015 0817   K 2.7* 12/31/2014 1102   CL 97 12/31/2014 1102   CO2 24 03/25/2015 0817   CO2 26 12/31/2014 1102   BUN 9.2 03/25/2015 0817   BUN 9 12/31/2014 1102   CREATININE 0.8 03/25/2015 0817   CREATININE 0.82 12/31/2014 1102      Component Value Date/Time   CALCIUM 9.8 03/25/2015  0817   CALCIUM 9.0 12/31/2014 1102   ALKPHOS 96 03/25/2015 0817   ALKPHOS 101 12/31/2014 1102   AST 18 03/25/2015 0817   AST 17 12/31/2014 1102   ALT 20 03/25/2015 0817   ALT 13 12/31/2014 1102  BILITOT 0.48 03/25/2015 0817   BILITOT 0.4 12/31/2014 1102       No results found for: LABCA2  No components found for: SJGGE366  No results for input(s): INR in the last 168 hours.  Urinalysis    Component Value Date/Time   COLORURINE YELLOW 01/14/2014 1206   APPEARANCEUR CLEAR 01/14/2014 1206   LABSPEC <=1.005* 01/14/2014 1206   PHURINE 5.5 01/14/2014 1206   GLUCOSEU NEGATIVE 01/14/2014 1206   HGBUR NEGATIVE 01/14/2014 1206   HGBUR negative 08/19/2010 1041   BILIRUBINUR NEGATIVE 01/14/2014 1206   KETONESUR NEGATIVE 01/14/2014 1206   UROBILINOGEN 0.2 01/14/2014 1206   NITRITE NEGATIVE 01/14/2014 1206   LEUKOCYTESUR NEGATIVE 01/14/2014 1206    STUDIES: No results found.  ASSESSMENT: 54 y.o. 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   2) neoadjuvant chemotherapy to consist of cyclophosphamide and doxorubicin in dose dense fashion 4 given with Neulasta support, completed 01/06/2015, followed by weekly carboplatin and paclitaxel 12  (3) definitive surgery to follow chemotherapy  (4) adjuvant radiation, if appropriate, to follow surgery  PLAN: [name] [name] is doing well today. The labs were reviewed in detail and her [place] is stable at 3.0. She will proceed with cycle 8 of  paclitaxel and carboplatin as planned today. With this cycle she will only have 2 neupogen injections because today is already Thursday, and she did not want to return on Monday for the 3rd round. We will see how her counts perform with this adjustment. We will move back up to 3 injections if necessary.   [name] [name] will return in 1 week for cycle 9 of treatment. She understands and agrees with this plan. She knows the goal of treatment in her case is cure. She has been encouraged to call with any issues that might arise before her next visit here.     Laurie Panda, NP   03/25/2015 9:46 AM

## 2015-03-25 NOTE — Telephone Encounter (Signed)
Appointments made and avs will be printed in chemo  °

## 2015-03-26 ENCOUNTER — Other Ambulatory Visit: Payer: Self-pay | Admitting: Oncology

## 2015-03-26 ENCOUNTER — Ambulatory Visit: Payer: BC Managed Care – PPO

## 2015-03-26 ENCOUNTER — Other Ambulatory Visit (HOSPITAL_BASED_OUTPATIENT_CLINIC_OR_DEPARTMENT_OTHER): Payer: BC Managed Care – PPO | Admitting: *Deleted

## 2015-03-26 DIAGNOSIS — C50811 Malignant neoplasm of overlapping sites of right female breast: Secondary | ICD-10-CM

## 2015-03-26 DIAGNOSIS — Z5189 Encounter for other specified aftercare: Secondary | ICD-10-CM | POA: Diagnosis not present

## 2015-03-26 DIAGNOSIS — C50411 Malignant neoplasm of upper-outer quadrant of right female breast: Secondary | ICD-10-CM

## 2015-03-26 MED ORDER — ALTEPLASE 2 MG IJ SOLR
2.0000 mg | Freq: Once | INTRAMUSCULAR | Status: AC | PRN
  Filled 2015-03-26: qty 2

## 2015-03-26 MED ORDER — PROMETHAZINE HCL 25 MG/ML IJ SOLN: 25.0000 mg | Freq: Once | INTRAMUSCULAR | Status: DC

## 2015-03-26 MED ORDER — SODIUM CHLORIDE 0.9 % IV SOLN: Freq: Once | INTRAVENOUS | Status: DC

## 2015-03-26 MED ORDER — TBO-FILGRASTIM 300 MCG/0.5ML ~~LOC~~ SOSY
300.0000 ug | PREFILLED_SYRINGE | Freq: Once | SUBCUTANEOUS | Status: AC
  Administered 2015-03-26: 300 ug via SUBCUTANEOUS
  Filled 2015-03-26: qty 0.5

## 2015-03-26 MED ORDER — HEPARIN SOD (PORK) LOCK FLUSH 100 UNIT/ML IV SOLN
250.0000 [IU] | Freq: Once | INTRAVENOUS | Status: AC | PRN
  Filled 2015-03-26: qty 5

## 2015-03-26 MED ORDER — FILGRASTIM 300 MCG/0.5ML IJ SOSY
300.0000 ug | PREFILLED_SYRINGE | Freq: Once | INTRAMUSCULAR | Status: DC
Start: 2015-03-26 — End: 2015-03-26

## 2015-03-26 MED ORDER — SODIUM CHLORIDE 0.9 % IJ SOLN
10.0000 mL | INTRAMUSCULAR | Status: DC | PRN
  Filled 2015-03-26: qty 10

## 2015-03-26 MED ORDER — FILGRASTIM 300 MCG/0.5ML IJ SOSY: 300.0000 ug | PREFILLED_SYRINGE | Freq: Once | INTRAMUSCULAR | Status: DC

## 2015-03-26 MED ORDER — HEPARIN SOD (PORK) LOCK FLUSH 100 UNIT/ML IV SOLN
500.0000 [IU] | Freq: Once | INTRAVENOUS | Status: AC | PRN
  Filled 2015-03-26: qty 5

## 2015-03-26 NOTE — Patient Instructions (Addendum)
Day 1 of Granix given.Tbo-Filgrastim injection What is this medicine? TBO-FILGRASTIM (T B O fil GRA stim) is a granulocyte colony-stimulating factor that stimulates the growth of neutrophils, a type of white blood cell important in the body's fight against infection. It is used to reduce the incidence of fever and infection in patients with certain types of cancer who are receiving chemotherapy that affects the bone marrow. This medicine may be used for other purposes; ask your health care provider or pharmacist if you have questions. COMMON BRAND NAME(S): Granix What should I tell my health care provider before I take this medicine? They need to know if you have any of these conditions: -ongoing radiation therapy -sickle cell anemia -an unusual or allergic reaction to tbo-filgrastim, filgrastim, pegfilgrastim, other medicines, foods, dyes, or preservatives -pregnant or trying to get pregnant -breast-feeding How should I use this medicine? This medicine is for injection under the skin. If you get this medicine at home, you will be taught how to prepare and give this medicine. Refer to the Instructions for Use that come with your medication packaging. Use exactly as directed. Take your medicine at regular intervals. Do not take your medicine more often than directed. It is important that you put your used needles and syringes in a special sharps container. Do not put them in a trash can. If you do not have a sharps container, call your pharmacist or healthcare provider to get one. Talk to your pediatrician regarding the use of this medicine in children. Special care may be needed. Overdosage: If you think you've taken too much of this medicine contact a poison control center or emergency room at once. Overdosage: If you think you have taken too much of this medicine contact a poison control center or emergency room at once. NOTE: This medicine is only for you. Do not share this medicine with  others. What if I miss a dose? It is important not to miss your dose. Call your doctor or health care professional if you miss a dose. What may interact with this medicine? This medicine may interact with the following medications: -medicines that may cause a release of neutrophils, such as lithium This list may not describe all possible interactions. Give your health care provider a list of all the medicines, herbs, non-prescription drugs, or dietary supplements you use. Also tell them if you smoke, drink alcohol, or use illegal drugs. Some items may interact with your medicine. What should I watch for while using this medicine? You may need blood work done while you are taking this medicine. What side effects may I notice from receiving this medicine? Side effects that you should report to your doctor or health care professional as soon as possible: -allergic reactions like skin rash, itching or hives, swelling of the face, lips, or tongue -shortness of breath or breathing problems -fever -pain, redness, or irritation at site where injected -pinpoint red spots on the skin -stomach or side pain, or pain at the shoulder -swelling -tiredness -trouble passing urine Side effects that usually do not require medical attention (Report these to your doctor or health care professional if they continue or are bothersome.): -bone pain -muscle pain This list may not describe all possible side effects. Call your doctor for medical advice about side effects. You may report side effects to FDA at 1-800-FDA-1088. Where should I keep my medicine? Keep out of the reach of children. Store in a refrigerator between 2 and 8 degrees C (36 and 46 degrees F).  Keep in carton to protect from light. Throw away this medicine if it is left out of the refrigerator for more than 5 consecutive days. Throw away any unused medicine after the expiration date. NOTE: This sheet is a summary. It may not cover all possible  information. If you have questions about this medicine, talk to your doctor, pharmacist, or health care provider.  2015, [employer] Standard. (2013-12-25 11:52:29)

## 2015-03-27 ENCOUNTER — Ambulatory Visit (HOSPITAL_BASED_OUTPATIENT_CLINIC_OR_DEPARTMENT_OTHER): Payer: BC Managed Care – PPO

## 2015-03-27 VITALS — BP 125/75 | HR 84 | Temp 98.2°F | Resp 20

## 2015-03-27 DIAGNOSIS — Z5189 Encounter for other specified aftercare: Secondary | ICD-10-CM | POA: Diagnosis not present

## 2015-03-27 DIAGNOSIS — C50811 Malignant neoplasm of overlapping sites of right female breast: Secondary | ICD-10-CM | POA: Diagnosis not present

## 2015-03-27 DIAGNOSIS — C50411 Malignant neoplasm of upper-outer quadrant of right female breast: Secondary | ICD-10-CM

## 2015-03-27 MED ORDER — TBO-FILGRASTIM 300 MCG/0.5ML ~~LOC~~ SOSY
300.0000 ug | PREFILLED_SYRINGE | Freq: Once | SUBCUTANEOUS | Status: AC
  Administered 2015-03-27: 300 ug via SUBCUTANEOUS

## 2015-03-27 NOTE — Patient Instructions (Signed)

## 2015-03-29 ENCOUNTER — Ambulatory Visit: Payer: BC Managed Care – PPO

## 2015-04-01 ENCOUNTER — Other Ambulatory Visit: Payer: Self-pay | Admitting: Oncology

## 2015-04-01 ENCOUNTER — Other Ambulatory Visit (HOSPITAL_BASED_OUTPATIENT_CLINIC_OR_DEPARTMENT_OTHER): Payer: BC Managed Care – PPO

## 2015-04-01 ENCOUNTER — Ambulatory Visit (HOSPITAL_BASED_OUTPATIENT_CLINIC_OR_DEPARTMENT_OTHER): Payer: BC Managed Care – PPO | Admitting: Nurse Practitioner

## 2015-04-01 ENCOUNTER — Encounter: Payer: Self-pay | Admitting: *Deleted

## 2015-04-01 ENCOUNTER — Ambulatory Visit (HOSPITAL_BASED_OUTPATIENT_CLINIC_OR_DEPARTMENT_OTHER): Payer: BC Managed Care – PPO

## 2015-04-01 ENCOUNTER — Encounter: Payer: Self-pay | Admitting: Nurse Practitioner

## 2015-04-01 VITALS — BP 120/72 | HR 93 | Temp 98.2°F | Resp 18 | Ht 66.0 in | Wt 217.5 lb

## 2015-04-01 DIAGNOSIS — C50811 Malignant neoplasm of overlapping sites of right female breast: Secondary | ICD-10-CM

## 2015-04-01 DIAGNOSIS — C50411 Malignant neoplasm of upper-outer quadrant of right female breast: Secondary | ICD-10-CM

## 2015-04-01 DIAGNOSIS — I10 Essential (primary) hypertension: Secondary | ICD-10-CM

## 2015-04-01 DIAGNOSIS — Z5111 Encounter for antineoplastic chemotherapy: Secondary | ICD-10-CM | POA: Diagnosis not present

## 2015-04-01 DIAGNOSIS — G35 Multiple sclerosis: Secondary | ICD-10-CM

## 2015-04-01 LAB — CBC WITH DIFFERENTIAL/PLATELET
BASO%: 0.2 % (ref 0.0–2.0)
Basophils Absolute: 0 10*3/uL (ref 0.0–0.1)
EOS%: 1.3 % (ref 0.0–7.0)
Eosinophils Absolute: 0.1 10*3/uL (ref 0.0–0.5)
HEMATOCRIT: 33.1 % — AB (ref 34.8–46.6)
HGB: 11.1 g/dL — ABNORMAL LOW (ref 11.6–15.9)
LYMPH%: 30.7 % (ref 14.0–49.7)
MCH: 30.1 pg (ref 25.1–34.0)
MCHC: 33.5 g/dL (ref 31.5–36.0)
MCV: 89.7 fL (ref 79.5–101.0)
MONO#: 0.8 10*3/uL (ref 0.1–0.9)
MONO%: 14.5 % — ABNORMAL HIGH (ref 0.0–14.0)
NEUT#: 2.9 10*3/uL (ref 1.5–6.5)
NEUT%: 53.3 % (ref 38.4–76.8)
PLATELETS: 320 10*3/uL (ref 145–400)
RBC: 3.69 10*6/uL — AB (ref 3.70–5.45)
RDW: 19.7 % — ABNORMAL HIGH (ref 11.2–14.5)
WBC: 5.4 10*3/uL (ref 3.9–10.3)
lymph#: 1.7 10*3/uL (ref 0.9–3.3)

## 2015-04-01 LAB — COMPREHENSIVE METABOLIC PANEL (CC13)
ALBUMIN: 3.8 g/dL (ref 3.5–5.0)
ALT: 19 U/L (ref 0–55)
AST: 18 U/L (ref 5–34)
Alkaline Phosphatase: 88 U/L (ref 40–150)
Anion Gap: 9 mEq/L (ref 3–11)
BUN: 12.3 mg/dL (ref 7.0–26.0)
CO2: 24 meq/L (ref 22–29)
CREATININE: 0.7 mg/dL (ref 0.6–1.1)
Calcium: 9.6 mg/dL (ref 8.4–10.4)
Chloride: 106 mEq/L (ref 98–109)
EGFR: >90 mL/min/{1.73_m2} (ref >90)
Glucose: 122 mg/dl (ref 70–140)
POTASSIUM: 3.6 meq/L (ref 3.5–5.1)
SODIUM: 138 meq/L (ref 136–145)
Total Bilirubin: 0.39 mg/dL (ref 0.20–1.20)
Total Protein: 6.5 g/dL (ref 6.4–8.3)

## 2015-04-01 MED ORDER — PACLITAXEL CHEMO INJECTION 300 MG/50ML
68.0000 mg/m2 | Freq: Once | INTRAVENOUS | Status: AC
  Administered 2015-04-01: 144 mg via INTRAVENOUS
  Filled 2015-04-01: qty 24

## 2015-04-01 MED ORDER — DIPHENHYDRAMINE HCL 50 MG/ML IJ SOLN
25.0000 mg | Freq: Once | INTRAMUSCULAR | Status: AC
  Administered 2015-04-01: 25 mg via INTRAVENOUS

## 2015-04-01 MED ORDER — FAMOTIDINE IN NACL 20-0.9 MG/50ML-% IV SOLN
20.0000 mg | Freq: Once | INTRAVENOUS | Status: AC
  Administered 2015-04-01: 20 mg via INTRAVENOUS

## 2015-04-01 MED ORDER — SODIUM CHLORIDE 0.9 % IJ SOLN
10.0000 mL | INTRAMUSCULAR | Status: DC | PRN
  Administered 2015-04-01: 10 mL
  Filled 2015-04-01: qty 10

## 2015-04-01 MED ORDER — SODIUM CHLORIDE 0.9 % IV SOLN
Freq: Once | INTRAVENOUS | Status: AC
  Administered 2015-04-01: 10:00:00 via INTRAVENOUS

## 2015-04-01 MED ORDER — SODIUM CHLORIDE 0.9 % IV SOLN
Freq: Once | INTRAVENOUS | Status: AC
  Administered 2015-04-01: 11:00:00 via INTRAVENOUS
  Filled 2015-04-01: qty 8

## 2015-04-01 MED ORDER — FAMOTIDINE IN NACL 20-0.9 MG/50ML-% IV SOLN
INTRAVENOUS | Status: AC
  Filled 2015-04-01: qty 50

## 2015-04-01 MED ORDER — SODIUM CHLORIDE 0.9 % IV SOLN
300.0000 mg | Freq: Once | INTRAVENOUS | Status: AC
  Administered 2015-04-01: 300 mg via INTRAVENOUS
  Filled 2015-04-01: qty 30

## 2015-04-01 MED ORDER — DIPHENHYDRAMINE HCL 50 MG/ML IJ SOLN
INTRAMUSCULAR | Status: AC
  Filled 2015-04-01: qty 1

## 2015-04-01 MED ORDER — HEPARIN SOD (PORK) LOCK FLUSH 100 UNIT/ML IV SOLN
500.0000 [IU] | Freq: Once | INTRAVENOUS | Status: AC | PRN
  Administered 2015-04-01: 500 [IU]
  Filled 2015-04-01: qty 5

## 2015-04-01 NOTE — Progress Notes (Signed)
[name] [name]  [phone] [phone]     ID: [name] [name] DOB: 10/20/1960  MR#: [id number]  YSA#:[id number]  Patient Care Team: [name], MD as PCP - General [name], MD as Consulting Physician (Obstetrics and Gynecology) [name], MD (Gastroenterology) [name]. [name], MD (Neurology) [name], MD as Consulting Physician (General Surgery) [name], MD as Consulting Physician (Oncology) [name], MD as Attending Physician (Radiation Oncology) [name], RN as Registered Nurse [name], RN as Registered Nurse OTHER MD:  CHIEF COMPLAINT: Triple negative breast cancer  CURRENT TREATMENT: Neoadjuvant chemotherapy  BREAST CANCER HISTORY: From the original intake note:  [name] had bilateral screening mammography at the [facility] 11/21/2013. Breast density was category C. Exam was unremarkable. In January 2016 however the patient palpated a change in her right breast laterally. She contacted her gynecologist, Dr. [name], and he set her up for a unilateral right diagnostic mammography with right breast ultrasonography at [facility] 10/15/2014. There was a focal irregular opacity in the lateral right breast associated with pleomorphic calcifications. This was palpable on exam as a firm non-mobile mass. Ultrasound confirmed a hypoechoic mass in the right breast at the 9:00 position 5 cm from the nipple, measuring 2.9 cm. Next to this mass was a normal-sized intramammary lymph node measuring 4 mm. In the right axilla there were 3 contiguous level I lymph nodes the largest measuring 11 mm.  On 10/20/2014 the patient underwent right core needle biopsy of the 9:00 breast mass, showing ([id number]) and invasive ductal carcinoma, grade 3, triple negative, with the HER-2/neu signals ratio of 1.26 and number per cell 2.15. The MIB-1 was 71%. Biopsy of the largest right axillary lymph node was negative. Dr.  Owens Shark the mammographer who performed a biopsy describes this is concordant.  Left breast mammography 10/26/2014 was negative, and bilateral breast MRI the same day confirmed, in the right breast, and upper outer quadrant mass measuring 2.5 cm, with a second mass measuring 1.4 cm slightly anterior and superior to it. In addition there was a total area of approximately 6.5 cm of non-masslike enhancement extending throughout the upper outer quadrant of the right breast. There was a cortically thickened right axillary lymph node which had been biopsied. There was also a mildly cortically thickened right subpectoral lymph node. There were no internal mammary or left axillary lymph nodes in the left breast was unremarkable.  The patient's subsequent history is as detailed below.  INTERVAL HISTORY: [name] returns today for follow up of her breast cancer. Today day 1, cycle 9 of 12 planned cycles of paclitaxel and carboplatin, given weekly. She received 2 neupogen injections last week to prevent recurrent neutropenias.  REVIEW OF SYSTEMS: [name] has no new complaints today. She denies fevers, chills, nausea, vomiting, or change sin bowel or bladder habits. Her neuropathy actually comes and goes in her right hand now and in her left it is very faint. Her bilateral great toes are effected, but they are no worse, and no other toes feel different. She is eating and drinking well. Her energy level is fair.  A detailed review of systems is otherwise entirely negative.   PAST MEDICAL HISTORY: Past Medical History  Diagnosis Date  . METABOLIC SYNDROME X   . OBESITY, TRUNCAL   . CONSTIPATION, CHRONIC   . KELOID   . Multiple sclerosis   . Allergic rhinitis due to other allergen   . HYPERTENSION   . HYPERLIPIDEMIA   .  GERD   . Angioedema     ACEI  . Colon polyps 05/2014    adenomatous, colo q 5y  . Migraine headache   . Allergy   . Anxiety   . Breast cancer of upper-outer quadrant of right female breast  10/23/2014  . Hot flashes     PAST SURGICAL HISTORY: Past Surgical History  Procedure Laterality Date  . Tubal ligation    . Colonoscopy    . Portacath placement Left 11/06/2014    Procedure: INSERTION PORT-A-CATH;  Surgeon: [name], MD;  Location: [facility];  Service: General;  Laterality: Left;    FAMILY HISTORY Family History  Problem Relation Age of Onset  . Coronary artery disease Mother   . Heart disease Mother   . Diabetes Mother   . Hypertension Other   . Hyperlipidemia Other   . Diabetes Other   . Colon cancer Neg Hx   . Alcohol abuse Father   . COPD Maternal Grandmother    the patient's father died at the age of 63 following his seizure. The patient's mother died from congestive 2 failure at the age of 7. The patient had 5 brothers and 2 sisters. One brother died from Escherichia coli infection, the other one had a history of seizure disorder. One sister died from causes unknown to the patient. There is no history of breast or ovarian cancer in the family.  GYNECOLOGIC HISTORY:  Patient's last menstrual period was 11/01/2012. Menarche age 74, first live birth age 65. The patient is GX P2. She stopped having periods in May 2014. She did not take hormone replacement. She did take her control pills remotely for approximately 4 years with no complications.  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 Her daughter [name] lives in [place] and works in Ambulance person for [employer]. Son [place] "[name]" Boutin is an Engineer, building services for [employer]: Not in place   HEALTH MAINTENANCE: History  Substance Use Topics  . Smoking status: Never Smoker   . Smokeless tobacco: Never Used     Comment: Single- brother staying with pt since mom's death  . Alcohol Use: No     Colonoscopy: September 2015/John  [facility]  PAP: February 2015  Bone density:  Lipid panel:  Allergies  Allergen Reactions  . Ace Inhibitors Anaphylaxis    REACTION: Angioedema (tongue swelling)  . Clarithromycin Anaphylaxis    Throat swells  . Levaquin [Levofloxacin] Other (See Comments)    Tendon pain - shoulder and calf    Current Outpatient Prescriptions  Medication Sig Dispense Refill  . ALPRAZolam (XANAX) 0.5 MG tablet Take 1 tablet (0.5 mg total) by mouth 3 (three) times daily as needed for anxiety or sleep. 30 tablet 1  . Glatiramer Acetate (COPAXONE) 40 MG/ML SOSY Inject 40 mg into the skin 3 (three) times a week. 12 Syringe 0  . hydrochlorothiazide (HYDRODIURIL) 25 MG tablet TAKE 1 TABLET DAILY 90 tablet 3  . lidocaine-prilocaine (EMLA) cream Apply to affected area once 30 g 3  . loratadine-pseudoephedrine (CLARITIN-D 12 HOUR) 5-120 MG per tablet Take 1 tablet by mouth as needed for allergies. 30 tablet 3  . losartan (COZAAR) 50 MG tablet Take 1 tablet (50 mg total) by mouth 2 (two) times daily. 180 tablet 3  . metoprolol (LOPRESSOR) 50 MG tablet TAKE 1 TABLET TWICE A DAY 180 tablet  3  . pantoprazole (PROTONIX) 40 MG tablet Take 1 tablet (40 mg total) by mouth daily. 90 tablet 1  . potassium chloride (MICRO-K) 10 MEQ CR capsule Take 1 capsule (10 mEq total) by mouth 2 (two) times daily. (Patient taking differently: Take 10 mEq by mouth 2 (two) times daily. Pt taking 2 - 10 mEq tablets 2 times a day. Total dose 40 mEq daily.) 180 capsule 0  . [DISCONTINUED] mometasone (NASONEX) 50 MCG/ACT nasal spray Place 2 sprays into the nose daily. 17 g 2   No current facility-administered medications for this visit.   Facility-Administered Medications Ordered in Other Visits  Medication Dose Route Frequency Provider Last Rate Last Dose  . sodium chloride 0.9 % injection 10 mL  10 mL Intracatheter PRN Genelle Gather Margit Batte, NP      . sodium chloride 0.9 % injection 10 mL  10 mL Intracatheter PRN Laurie Panda, NP         OBJECTIVE: Middle-aged African-American woman who appears stated age 54 Vitals:   04/01/15 0906  BP: 120/72  Pulse: 93  Temp: 98.2 F (36.8 C)  Resp: 18     Body mass index is 35.12 kg/(m^2).    ECOG FS:0 - Asymptomatic   Sclerae unicteric, pupils round and equal Oropharynx clear and moist-- no thrush or other lesions No cervical or supraclavicular adenopathy Lungs no rales or rhonchi Heart regular rate and rhythm Abd soft, nontender, positive bowel sounds MSK no focal spinal tenderness, no upper extremity lymphedema Neuro: nonfocal, well oriented, appropriate affect Breasts: deferred  LAB RESULTS:  CMP     Component Value Date/Time   NA 141 03/25/2015 0817   NA 133* 12/31/2014 1102   K 3.6 03/25/2015 0817   K 2.7* 12/31/2014 1102   CL 97 12/31/2014 1102   CO2 24 03/25/2015 0817   CO2 26 12/31/2014 1102   GLUCOSE 131 03/25/2015 0817   GLUCOSE 129* 12/31/2014 1102   BUN 9.2 03/25/2015 0817   BUN 9 12/31/2014 1102   CREATININE 0.8 03/25/2015 0817   CREATININE 0.82 12/31/2014 1102   CALCIUM 9.8 03/25/2015 0817   CALCIUM 9.0 12/31/2014 1102   PROT 6.5 03/25/2015 0817   PROT 7.3 12/31/2014 1102   ALBUMIN 3.8 03/25/2015 0817   ALBUMIN 4.0 12/31/2014 1102   AST 18 03/25/2015 0817   AST 17 12/31/2014 1102   ALT 20 03/25/2015 0817   ALT 13 12/31/2014 1102   ALKPHOS 96 03/25/2015 0817   ALKPHOS 101 12/31/2014 1102   BILITOT 0.48 03/25/2015 0817   BILITOT 0.4 12/31/2014 1102   GFRNONAA 81* 12/31/2014 1102   GFRAA >90 12/31/2014 1102    INo results found for: SPEP, UPEP  Lab Results  Component Value Date   WBC 5.4 04/01/2015   NEUTROABS 2.9 04/01/2015   HGB 11.1* 04/01/2015   HCT 33.1* 04/01/2015   MCV 89.7 04/01/2015   PLT 320 04/01/2015      Chemistry      Component Value Date/Time   NA 141 03/25/2015 0817   NA 133* 12/31/2014 1102   K 3.6 03/25/2015 0817   K 2.7* 12/31/2014 1102   CL 97 12/31/2014 1102   CO2 24 03/25/2015 0817   CO2 26  12/31/2014 1102   BUN 9.2 03/25/2015 0817   BUN 9 12/31/2014 1102   CREATININE 0.8 03/25/2015 0817   CREATININE 0.82 12/31/2014 1102      Component Value Date/Time   CALCIUM 9.8 03/25/2015 0817   CALCIUM 9.0 12/31/2014 1102  ALKPHOS 96 03/25/2015 0817   ALKPHOS 101 12/31/2014 1102   AST 18 03/25/2015 0817   AST 17 12/31/2014 1102   ALT 20 03/25/2015 0817   ALT 13 12/31/2014 1102   BILITOT 0.48 03/25/2015 0817   BILITOT 0.4 12/31/2014 1102       No results found for: LABCA2  No components found for: LABCA125  No results for input(s): INR in the last 168 hours.  Urinalysis    Component Value Date/Time   COLORURINE YELLOW 01/14/2014 1206   APPEARANCEUR CLEAR 01/14/2014 1206   LABSPEC <=1.005* 01/14/2014 1206   PHURINE 5.5 01/14/2014 1206   GLUCOSEU NEGATIVE 01/14/2014 1206   HGBUR NEGATIVE 01/14/2014 1206   HGBUR negative 08/19/2010 1041   BILIRUBINUR NEGATIVE 01/14/2014 1206   KETONESUR NEGATIVE 01/14/2014 1206   UROBILINOGEN 0.2 01/14/2014 1206   NITRITE NEGATIVE 01/14/2014 1206   LEUKOCYTESUR NEGATIVE 01/14/2014 1206    STUDIES: No results found.  ASSESSMENT: 54 y.o. 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   2) neoadjuvant chemotherapy to consist of cyclophosphamide and doxorubicin in dose dense fashion 4 given with Neulasta support, completed 01/06/2015, followed by weekly carboplatin and paclitaxel 12  (3) definitive surgery to follow  chemotherapy  (4) adjuvant radiation, if appropriate, to follow surgery  PLAN:  [name] continues to tolerate treatment well. She only received 2 neupogen injections last week and her ANC is stable at 2.9. She will proceed with cycle 9 of paclitaxel and carboplatin as planned today, and we will continue with just 2 neupogen injection this week as well.  [name] will return in 1 week for cycle 10 of treatment. She understands and agrees with this plan. She knows the goal of treatment in her case is cure. She has been encouraged to call with any issues that might arise before her next visit here.     Laurie Panda, NP   04/01/2015 9:16 AM

## 2015-04-01 NOTE — Patient Instructions (Signed)
[facility] Discharge Instructions for Patients Receiving Chemotherapy  Today you received the following chemotherapy agents Taxol/Carboplatin To help prevent nausea and vomiting after your treatment, we encourage you to take your nausea medication as prescribed.   If you develop nausea and vomiting that is not controlled by your nausea medication, call the clinic.   BELOW ARE SYMPTOMS THAT SHOULD BE REPORTED IMMEDIATELY:  *FEVER GREATER THAN 100.5 F  *CHILLS WITH OR WITHOUT FEVER  NAUSEA AND VOMITING THAT IS NOT CONTROLLED WITH YOUR NAUSEA MEDICATION  *UNUSUAL SHORTNESS OF BREATH  *UNUSUAL BRUISING OR BLEEDING  TENDERNESS IN MOUTH AND THROAT WITH OR WITHOUT PRESENCE OF ULCERS  *URINARY PROBLEMS  *BOWEL PROBLEMS  UNUSUAL RASH Items with * indicate a potential emergency and should be followed up as soon as possible.  Feel free to call the clinic you have any questions or concerns. The clinic phone number is [phone].  Please show the CHEMO ALERT CARD at check-in to the Emergency Department and triage nurse.   

## 2015-04-02 ENCOUNTER — Ambulatory Visit (HOSPITAL_BASED_OUTPATIENT_CLINIC_OR_DEPARTMENT_OTHER): Payer: BC Managed Care – PPO

## 2015-04-02 ENCOUNTER — Telehealth: Payer: Self-pay | Admitting: Oncology

## 2015-04-02 ENCOUNTER — Other Ambulatory Visit: Payer: Self-pay | Admitting: Nurse Practitioner

## 2015-04-02 VITALS — BP 129/71 | HR 83 | Temp 98.1°F | Resp 18

## 2015-04-02 DIAGNOSIS — C50811 Malignant neoplasm of overlapping sites of right female breast: Secondary | ICD-10-CM

## 2015-04-02 DIAGNOSIS — Z5189 Encounter for other specified aftercare: Secondary | ICD-10-CM

## 2015-04-02 DIAGNOSIS — C50411 Malignant neoplasm of upper-outer quadrant of right female breast: Secondary | ICD-10-CM

## 2015-04-02 MED ORDER — FILGRASTIM 300 MCG/0.5ML IJ SOSY: 300.0000 ug | PREFILLED_SYRINGE | Freq: Once | INTRAMUSCULAR | Status: DC

## 2015-04-02 MED ORDER — TBO-FILGRASTIM 300 MCG/0.5ML ~~LOC~~ SOSY
300.0000 ug | PREFILLED_SYRINGE | Freq: Once | SUBCUTANEOUS | Status: AC
  Administered 2015-04-02: 300 ug via SUBCUTANEOUS
  Filled 2015-04-02: qty 0.5

## 2015-04-02 NOTE — Patient Instructions (Signed)

## 2015-04-02 NOTE — Telephone Encounter (Signed)
lvm for pt advised that 7.18 appt cx

## 2015-04-03 ENCOUNTER — Ambulatory Visit (HOSPITAL_BASED_OUTPATIENT_CLINIC_OR_DEPARTMENT_OTHER): Payer: BC Managed Care – PPO

## 2015-04-03 VITALS — BP 129/72 | HR 77 | Temp 98.3°F | Resp 16

## 2015-04-03 DIAGNOSIS — C50811 Malignant neoplasm of overlapping sites of right female breast: Secondary | ICD-10-CM | POA: Diagnosis not present

## 2015-04-03 DIAGNOSIS — Z5189 Encounter for other specified aftercare: Secondary | ICD-10-CM

## 2015-04-03 MED ORDER — FILGRASTIM 300 MCG/0.5ML IJ SOSY: 300.0000 ug | PREFILLED_SYRINGE | Freq: Once | INTRAMUSCULAR | Status: DC

## 2015-04-03 MED ORDER — TBO-FILGRASTIM 300 MCG/0.5ML ~~LOC~~ SOSY
300.0000 ug | PREFILLED_SYRINGE | Freq: Once | SUBCUTANEOUS | Status: AC
  Administered 2015-04-03: 300 ug via SUBCUTANEOUS

## 2015-04-03 NOTE — Progress Notes (Signed)
Neupogen in supportive therapy. Patient is receiving granix 300 mcg with no pending orders. RN injected Granix 300 mcg given in left arm with not difficulty. Pharmacy notified with note dated on 04/03/15.

## 2015-04-05 ENCOUNTER — Ambulatory Visit: Payer: BC Managed Care – PPO

## 2015-04-08 ENCOUNTER — Encounter: Payer: Self-pay | Admitting: Nurse Practitioner

## 2015-04-08 ENCOUNTER — Ambulatory Visit (HOSPITAL_BASED_OUTPATIENT_CLINIC_OR_DEPARTMENT_OTHER): Payer: BC Managed Care – PPO | Admitting: Nurse Practitioner

## 2015-04-08 ENCOUNTER — Ambulatory Visit (HOSPITAL_BASED_OUTPATIENT_CLINIC_OR_DEPARTMENT_OTHER): Payer: BC Managed Care – PPO

## 2015-04-08 ENCOUNTER — Telehealth: Payer: Self-pay | Admitting: Nurse Practitioner

## 2015-04-08 ENCOUNTER — Encounter: Payer: Self-pay | Admitting: *Deleted

## 2015-04-08 ENCOUNTER — Other Ambulatory Visit (HOSPITAL_BASED_OUTPATIENT_CLINIC_OR_DEPARTMENT_OTHER): Payer: BC Managed Care – PPO

## 2015-04-08 VITALS — BP 113/81 | HR 92 | Temp 99.2°F | Resp 18 | Ht 66.0 in | Wt 219.3 lb

## 2015-04-08 DIAGNOSIS — C50411 Malignant neoplasm of upper-outer quadrant of right female breast: Secondary | ICD-10-CM

## 2015-04-08 DIAGNOSIS — I10 Essential (primary) hypertension: Secondary | ICD-10-CM

## 2015-04-08 DIAGNOSIS — G35 Multiple sclerosis: Secondary | ICD-10-CM

## 2015-04-08 DIAGNOSIS — Z5111 Encounter for antineoplastic chemotherapy: Secondary | ICD-10-CM

## 2015-04-08 DIAGNOSIS — C50811 Malignant neoplasm of overlapping sites of right female breast: Secondary | ICD-10-CM | POA: Diagnosis not present

## 2015-04-08 LAB — COMPREHENSIVE METABOLIC PANEL (CC13)
ALBUMIN: 3.7 g/dL (ref 3.5–5.0)
ALT: 20 U/L (ref 0–55)
AST: 19 U/L (ref 5–34)
Alkaline Phosphatase: 86 U/L (ref 40–150)
Anion Gap: 9 mEq/L (ref 3–11)
BILIRUBIN TOTAL: 0.46 mg/dL (ref 0.20–1.20)
BUN: 10.3 mg/dL (ref 7.0–26.0)
CO2: 25 mEq/L (ref 22–29)
Calcium: 9.3 mg/dL (ref 8.4–10.4)
Chloride: 107 mEq/L (ref 98–109)
Creatinine: 0.7 mg/dL (ref 0.6–1.1)
EGFR: >90 mL/min/{1.73_m2} (ref >90)
Glucose: 115 mg/dl (ref 70–140)
POTASSIUM: 3.6 meq/L (ref 3.5–5.1)
Sodium: 141 mEq/L (ref 136–145)
Total Protein: 6.4 g/dL (ref 6.4–8.3)

## 2015-04-08 LAB — CBC WITH DIFFERENTIAL/PLATELET
BASO%: 0.2 % (ref 0.0–2.0)
BASOS ABS: 0 10*3/uL (ref 0.0–0.1)
EOS%: 1.2 % (ref 0.0–7.0)
Eosinophils Absolute: 0.1 10*3/uL (ref 0.0–0.5)
HCT: 33 % — ABNORMAL LOW (ref 34.8–46.6)
HEMOGLOBIN: 11.2 g/dL — AB (ref 11.6–15.9)
LYMPH%: 31.2 % (ref 14.0–49.7)
MCH: 31 pg (ref 25.1–34.0)
MCHC: 33.9 g/dL (ref 31.5–36.0)
MCV: 91.4 fL (ref 79.5–101.0)
MONO#: 0.7 10*3/uL (ref 0.1–0.9)
MONO%: 14.4 % — ABNORMAL HIGH (ref 0.0–14.0)
NEUT%: 53 % (ref 38.4–76.8)
NEUTROS ABS: 2.7 10*3/uL (ref 1.5–6.5)
Platelets: 247 10*3/uL (ref 145–400)
RBC: 3.61 10*6/uL — ABNORMAL LOW (ref 3.70–5.45)
RDW: 19.7 % — ABNORMAL HIGH (ref 11.2–14.5)
WBC: 5.1 10*3/uL (ref 3.9–10.3)
lymph#: 1.6 10*3/uL (ref 0.9–3.3)

## 2015-04-08 MED ORDER — SODIUM CHLORIDE 0.9 % IJ SOLN
10.0000 mL | INTRAMUSCULAR | Status: DC | PRN
  Administered 2015-04-08: 10 mL
  Filled 2015-04-08: qty 10

## 2015-04-08 MED ORDER — PACLITAXEL CHEMO INJECTION 300 MG/50ML
68.0000 mg/m2 | Freq: Once | INTRAVENOUS | Status: AC
  Administered 2015-04-08: 144 mg via INTRAVENOUS
  Filled 2015-04-08: qty 24

## 2015-04-08 MED ORDER — POTASSIUM CHLORIDE ER 10 MEQ PO CPCR: 10.0000 meq | ORAL_CAPSULE | Freq: Two times a day (BID) | ORAL | Status: DC

## 2015-04-08 MED ORDER — FAMOTIDINE IN NACL 20-0.9 MG/50ML-% IV SOLN
INTRAVENOUS | Status: AC
Start: 2015-04-08 — End: 2015-04-08
  Filled 2015-04-08: qty 50

## 2015-04-08 MED ORDER — FAMOTIDINE IN NACL 20-0.9 MG/50ML-% IV SOLN
20.0000 mg | Freq: Once | INTRAVENOUS | Status: AC
  Administered 2015-04-08: 20 mg via INTRAVENOUS

## 2015-04-08 MED ORDER — SODIUM CHLORIDE 0.9 % IV SOLN
Freq: Once | INTRAVENOUS | Status: AC
  Administered 2015-04-08: 10:00:00 via INTRAVENOUS
  Filled 2015-04-08: qty 8

## 2015-04-08 MED ORDER — DIPHENHYDRAMINE HCL 50 MG/ML IJ SOLN
25.0000 mg | Freq: Once | INTRAMUSCULAR | Status: AC
  Administered 2015-04-08: 25 mg via INTRAVENOUS

## 2015-04-08 MED ORDER — SODIUM CHLORIDE 0.9 % IV SOLN
Freq: Once | INTRAVENOUS | Status: AC
  Administered 2015-04-08: 10:00:00 via INTRAVENOUS

## 2015-04-08 MED ORDER — LIDOCAINE-PRILOCAINE 2.5-2.5 % EX CREA
TOPICAL_CREAM | CUTANEOUS | Status: AC
  Filled 2015-04-08: qty 5

## 2015-04-08 MED ORDER — HEPARIN SOD (PORK) LOCK FLUSH 100 UNIT/ML IV SOLN
500.0000 [IU] | Freq: Once | INTRAVENOUS | Status: AC | PRN
  Administered 2015-04-08: 500 [IU]
  Filled 2015-04-08: qty 5

## 2015-04-08 MED ORDER — SODIUM CHLORIDE 0.9 % IV SOLN
300.0000 mg | Freq: Once | INTRAVENOUS | Status: AC
  Administered 2015-04-08: 300 mg via INTRAVENOUS
  Filled 2015-04-08: qty 30

## 2015-04-08 MED ORDER — DIPHENHYDRAMINE HCL 50 MG/ML IJ SOLN
INTRAMUSCULAR | Status: AC
  Filled 2015-04-08: qty 1

## 2015-04-08 NOTE — Patient Instructions (Signed)
[facility] Discharge Instructions for Patients Receiving Chemotherapy  Today you received the following chemotherapy agents Taxol and Carboplatin.  To help prevent nausea and vomiting after your treatment, we encourage you to take your nausea medication as prescribed.   If you develop nausea and vomiting that is not controlled by your nausea medication, call the clinic.   BELOW ARE SYMPTOMS THAT SHOULD BE REPORTED IMMEDIATELY:  *FEVER GREATER THAN 100.5 F  *CHILLS WITH OR WITHOUT FEVER  NAUSEA AND VOMITING THAT IS NOT CONTROLLED WITH YOUR NAUSEA MEDICATION  *UNUSUAL SHORTNESS OF BREATH  *UNUSUAL BRUISING OR BLEEDING  TENDERNESS IN MOUTH AND THROAT WITH OR WITHOUT PRESENCE OF ULCERS  *URINARY PROBLEMS  *BOWEL PROBLEMS  UNUSUAL RASH Items with * indicate a potential emergency and should be followed up as soon as possible.  Feel free to call the clinic you have any questions or concerns. The clinic phone number is [phone].  Please show the CHEMO ALERT CARD at check-in to the Emergency Department and triage nurse.   

## 2015-04-08 NOTE — Telephone Encounter (Signed)
Gave avs & calendar for July,August,September. Faxed MRI to GI to get schedule. Dr.Wakefield appointment schedule 05/10/15 @ 9:00am

## 2015-04-08 NOTE — Progress Notes (Signed)
[name] [name]  [phone] [phone]     ID: [name] [name] DOB: 06-01-1961  MR#: [id number]  FIE#:[id number]  Patient Care Team: [name], MD as PCP - General [name], MD as Consulting Physician (Obstetrics and Gynecology) [name], MD (Gastroenterology) [name]. [name], MD (Neurology) [name], MD as Consulting Physician (General Surgery) [name], MD as Consulting Physician (Oncology) [name], MD as Attending Physician (Radiation Oncology) [name], RN as Registered Nurse [name], RN as Registered Nurse OTHER MD:  CHIEF COMPLAINT: Triple negative breast cancer  CURRENT TREATMENT: Neoadjuvant chemotherapy  BREAST CANCER HISTORY: From the original intake note:  [name] [name] had bilateral screening mammography at the [facility] 11/21/2013. Breast density was category C. Exam was unremarkable. In January 2016 however the patient palpated a change in her right breast laterally. She contacted her gynecologist, Dr. [name], and he set her up for a unilateral right diagnostic mammography with right breast ultrasonography at [facility] 10/15/2014. There was a focal irregular opacity in the lateral right breast associated with pleomorphic calcifications. This was palpable on exam as a firm non-mobile mass. Ultrasound confirmed a hypoechoic mass in the right breast at the 9:00 position 5 cm from the nipple, measuring 2.9 cm. Next to this mass was a normal-sized intramammary lymph node measuring 4 mm. In the right axilla there were 3 contiguous level I lymph nodes the largest measuring 11 mm.  On 10/20/2014 the patient underwent right core needle biopsy of the 9:00 breast mass, showing ([id number]) and invasive ductal carcinoma, grade 3, triple negative, with the HER-2/neu signals ratio of 1.26 and number per cell 2.15. The MIB-1 was 71%. Biopsy of the largest right axillary lymph node was negative. Dr.  Owens Shark the mammographer who performed a biopsy describes this is concordant.  Left breast mammography 10/26/2014 was negative, and bilateral breast MRI the same day confirmed, in the right breast, and upper outer quadrant mass measuring 2.5 cm, with a second mass measuring 1.4 cm slightly anterior and superior to it. In addition there was a total area of approximately 6.5 cm of non-masslike enhancement extending throughout the upper outer quadrant of the right breast. There was a cortically thickened right axillary lymph node which had been biopsied. There was also a mildly cortically thickened right subpectoral lymph node. There were no internal mammary or left axillary lymph nodes in the left breast was unremarkable.  The patient's subsequent history is as detailed below.  INTERVAL HISTORY: [name] [name] returns today for follow up of her breast cancer. Today day 1, cycle 10 of 12 planned cycles of paclitaxel and carboplatin, given weekly. She received 2 neupogen injections last week to prevent recurrent neutropenias and will continue this pattern through the end of this regimen.   REVIEW OF SYSTEMS: [name] [name] looks and feels well today. She denies fevers, chills, nausea, vomiting, or changes in bowel or bladder habits. 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 The same is true for her bilateral great toes. Her appetite is healthy and she is drinking well. She has good energy. She denies headaches, dizziness, vision changes, or weakness. A detailed review of systems is otherwise stable.  PAST MEDICAL HISTORY: Past Medical History  Diagnosis Date  . METABOLIC SYNDROME X   . OBESITY, TRUNCAL   . CONSTIPATION, CHRONIC   . KELOID   . Multiple sclerosis   . Allergic rhinitis due to other allergen   . HYPERTENSION   .  HYPERLIPIDEMIA   . GERD   . Angioedema     ACEI  . Colon polyps 05/2014    adenomatous, colo q 5y  . Migraine headache   . Allergy   . Anxiety   . Breast  cancer of upper-outer quadrant of right female breast 10/23/2014  . Hot flashes     PAST SURGICAL HISTORY: Past Surgical History  Procedure Laterality Date  . Tubal ligation    . Colonoscopy    . Portacath placement Left 11/06/2014    Procedure: INSERTION PORT-A-CATH;  Surgeon: [name], MD;  Location: [facility];  Service: General;  Laterality: Left;    FAMILY HISTORY Family History  Problem Relation Age of Onset  . Coronary artery disease Mother   . Heart disease Mother   . Diabetes Mother   . Hypertension Other   . Hyperlipidemia Other   . Diabetes Other   . Colon cancer Neg Hx   . Alcohol abuse Father   . COPD Maternal Grandmother    the patient's father died at the age of 37 following his seizure. The patient's mother died from congestive 2 failure at the age of 19. The patient had 5 brothers and 2 sisters. One brother died from Escherichia coli infection, the other one had a history of seizure disorder. One sister died from causes unknown to the patient. There is no history of breast or ovarian cancer in the family.  GYNECOLOGIC HISTORY:  Patient's last menstrual period was 11/01/2012. Menarche age 21, first live birth age 63. The patient is GX P2. She stopped having periods in May 2014. She did not take hormone replacement. She did take her control pills remotely for approximately 4 years with no complications.  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 Her daughter [name] [name] lives in [place] and works in Ambulance person for [employer]. Son [name] [name] is an Engineer, building services for [employer]: Not in place   HEALTH MAINTENANCE: History  Substance Use Topics  . Smoking status: Never Smoker   . Smokeless tobacco: Never Used     Comment: Single- brother staying with pt since mom's death  .  Alcohol Use: No     Colonoscopy: September 2015/[name]  PAP: February 2015  Bone density:  Lipid panel:  Allergies  Allergen Reactions  . Ace Inhibitors Anaphylaxis    REACTION: Angioedema (tongue swelling)  . Clarithromycin Anaphylaxis    Throat swells  . Levaquin [Levofloxacin] Other (See Comments)    Tendon pain - shoulder and calf    Current Outpatient Prescriptions  Medication Sig Dispense Refill  . ALPRAZolam (XANAX) 0.5 MG tablet Take 1 tablet (0.5 mg total) by mouth 3 (three) times daily as needed for anxiety or sleep. 30 tablet 1  . Glatiramer Acetate (COPAXONE) 40 MG/ML SOSY Inject 40 mg into the skin 3 (three) times a week. 12 Syringe 0  . hydrochlorothiazide (HYDRODIURIL) 25 MG tablet TAKE 1 TABLET DAILY 90 tablet 3  . lidocaine-prilocaine (EMLA) cream Apply to affected area once 30 g 3  . loratadine-pseudoephedrine (CLARITIN-D 12 HOUR) 5-120 MG per tablet Take 1 tablet by mouth as needed for allergies. 30 tablet 3  . losartan (COZAAR) 50 MG tablet Take 1 tablet (50 mg total) by mouth 2 (two) times daily. 180 tablet 3  . metoprolol (LOPRESSOR) 50 MG tablet TAKE 1 TABLET TWICE  A DAY 180 tablet 3  . pantoprazole (PROTONIX) 40 MG tablet Take 1 tablet (40 mg total) by mouth daily. 90 tablet 1  . potassium chloride (MICRO-K) 10 MEQ CR capsule Take 1 capsule (10 mEq total) by mouth 2 (two) times daily. Pt taking 2 - 10 mEq tablets 2 times a day. Total dose 40 mEq daily. 180 capsule 0  . [DISCONTINUED] mometasone (NASONEX) 50 MCG/ACT nasal spray Place 2 sprays into the nose daily. 17 g 2   No current facility-administered medications for this visit.   Facility-Administered Medications Ordered in Other Visits  Medication Dose Route Frequency Provider Last Rate Last Dose  . sodium chloride 0.9 % injection 10 mL  10 mL Intracatheter PRN Gwendolyn Fill Kristopher Attwood, NP      . sodium chloride 0.9 % injection 10 mL  10 mL Intracatheter PRN Sheffield Slider, NP        OBJECTIVE:  Middle-aged African-American woman who appears stated age 54 Vitals:   04/08/15 0836  BP: 113/81  [name]: 92  Temp: 99.2 F (37.3 C)  Resp: 18     Body mass index is 35.41 kg/(m^2).    ECOG FS:0 - Asymptomatic   Skin: warm, dry, nails hyperpigmented HEENT: sclerae anicteric, conjunctivae pink, oropharynx clear. No thrush or mucositis.  Lymph Nodes: No cervical or supraclavicular lymphadenopathy  Lungs: clear to auscultation bilaterally, no rales, wheezes, or rhonci  Heart: regular rate and rhythm  Abdomen: round, soft, non tender, positive bowel sounds  Musculoskeletal: No focal spinal tenderness, no peripheral edema  Neuro: non focal, well oriented, positive affect  Breasts: deferred   LAB RESULTS:  CMP     Component Value Date/Time   NA 138 04/01/2015 0838   NA 133* 12/31/2014 1102   K 3.6 04/01/2015 0838   K 2.7* 12/31/2014 1102   CL 97 12/31/2014 1102   CO2 24 04/01/2015 0838   CO2 26 12/31/2014 1102   GLUCOSE 122 04/01/2015 0838   GLUCOSE 129* 12/31/2014 1102   BUN 12.3 04/01/2015 0838   BUN 9 12/31/2014 1102   CREATININE 0.7 04/01/2015 0838   CREATININE 0.82 12/31/2014 1102   CALCIUM 9.6 04/01/2015 0838   CALCIUM 9.0 12/31/2014 1102   PROT 6.5 04/01/2015 0838   PROT 7.3 12/31/2014 1102   ALBUMIN 3.8 04/01/2015 0838   ALBUMIN 4.0 12/31/2014 1102   AST 18 04/01/2015 0838   AST 17 12/31/2014 1102   ALT 19 04/01/2015 0838   ALT 13 12/31/2014 1102   ALKPHOS 88 04/01/2015 0838   ALKPHOS 101 12/31/2014 1102   BILITOT 0.39 04/01/2015 0838   BILITOT 0.4 12/31/2014 1102   GFRNONAA 81* 12/31/2014 1102   GFRAA >90 12/31/2014 1102    INo results found for: SPEP, UPEP  Lab Results  Component Value Date   WBC 5.1 04/08/2015   NEUTROABS 2.7 04/08/2015   HGB 11.2* 04/08/2015   HCT 33.0* 04/08/2015   MCV 91.4 04/08/2015   PLT 247 04/08/2015      Chemistry      Component Value Date/Time   NA 138 04/01/2015 0838   NA 133* 12/31/2014 1102   K 3.6  04/01/2015 0838   K 2.7* 12/31/2014 1102   CL 97 12/31/2014 1102   CO2 24 04/01/2015 0838   CO2 26 12/31/2014 1102   BUN 12.3 04/01/2015 0838   BUN 9 12/31/2014 1102   CREATININE 0.7 04/01/2015 0838   CREATININE 0.82 12/31/2014 1102      Component Value Date/Time  CALCIUM 9.6 04/01/2015 0838   CALCIUM 9.0 12/31/2014 1102   ALKPHOS 88 04/01/2015 0838   ALKPHOS 101 12/31/2014 1102   AST 18 04/01/2015 0838   AST 17 12/31/2014 1102   ALT 19 04/01/2015 0838   ALT 13 12/31/2014 1102   BILITOT 0.39 04/01/2015 0838   BILITOT 0.4 12/31/2014 1102       No results found for: LABCA2  No components found for: LABCA125  No results for input(s): INR in the last 168 hours.  Urinalysis    Component Value Date/Time   COLORURINE YELLOW 01/14/2014 1206   APPEARANCEUR CLEAR 01/14/2014 1206   LABSPEC <=1.005* 01/14/2014 1206   PHURINE 5.5 01/14/2014 1206   GLUCOSEU NEGATIVE 01/14/2014 1206   HGBUR NEGATIVE 01/14/2014 1206   HGBUR negative 08/19/2010 1041   BILIRUBINUR NEGATIVE 01/14/2014 1206   KETONESUR NEGATIVE 01/14/2014 1206   UROBILINOGEN 0.2 01/14/2014 1206   NITRITE NEGATIVE 01/14/2014 1206   LEUKOCYTESUR NEGATIVE 01/14/2014 1206    STUDIES: No results found.  ASSESSMENT: 54 y.o. 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   2) neoadjuvant chemotherapy to consist of cyclophosphamide and doxorubicin in dose dense fashion 4 given with Neulasta support, completed 01/06/2015,  followed by weekly carboplatin and paclitaxel 12  (3) definitive surgery to follow chemotherapy  (4) adjuvant radiation, if appropriate, to follow surgery  PLAN: [name] [name] continues to tolerate treatment well. Her ANC is holding steady at 2.7 with 2 neuopgen injections given last week. She will proceed with cycle 10 of paclitaxel and carboplatin as planned today, and we will continue with just 2 neupogen injection this week as well.  As she is nearing the end of this regimen. I have placed a referral order to Dr. Cristal Generous office, to take place after her final breast MRI in early August   Kathy will return in 1 week for cycle 11 of treatment. She understands and agrees with this plan. She knows the goal of treatment in her case is cure. She has been encouraged to call with any issues that might arise before her next visit here.     Laurie Panda, NP   04/08/2015 8:52 AM

## 2015-04-09 ENCOUNTER — Ambulatory Visit (HOSPITAL_BASED_OUTPATIENT_CLINIC_OR_DEPARTMENT_OTHER): Payer: BC Managed Care – PPO

## 2015-04-09 VITALS — BP 130/69 | HR 87 | Temp 98.5°F | Resp 18

## 2015-04-09 DIAGNOSIS — C50411 Malignant neoplasm of upper-outer quadrant of right female breast: Secondary | ICD-10-CM

## 2015-04-09 DIAGNOSIS — Z5189 Encounter for other specified aftercare: Secondary | ICD-10-CM

## 2015-04-09 DIAGNOSIS — C50811 Malignant neoplasm of overlapping sites of right female breast: Secondary | ICD-10-CM | POA: Diagnosis not present

## 2015-04-09 MED ORDER — TBO-FILGRASTIM 300 MCG/0.5ML ~~LOC~~ SOSY
300.0000 ug | PREFILLED_SYRINGE | Freq: Once | SUBCUTANEOUS | Status: AC
  Administered 2015-04-09: 300 ug via SUBCUTANEOUS
  Filled 2015-04-09: qty 0.5

## 2015-04-09 MED ORDER — FILGRASTIM 300 MCG/0.5ML IJ SOSY: 300.0000 ug | PREFILLED_SYRINGE | Freq: Once | INTRAMUSCULAR | Status: DC

## 2015-04-09 NOTE — Patient Instructions (Signed)

## 2015-04-10 ENCOUNTER — Ambulatory Visit (HOSPITAL_BASED_OUTPATIENT_CLINIC_OR_DEPARTMENT_OTHER): Payer: BC Managed Care – PPO

## 2015-04-10 VITALS — BP 122/70 | HR 81 | Temp 98.4°F | Resp 20

## 2015-04-10 DIAGNOSIS — C50411 Malignant neoplasm of upper-outer quadrant of right female breast: Secondary | ICD-10-CM

## 2015-04-10 DIAGNOSIS — Z5189 Encounter for other specified aftercare: Secondary | ICD-10-CM

## 2015-04-10 DIAGNOSIS — C50811 Malignant neoplasm of overlapping sites of right female breast: Secondary | ICD-10-CM

## 2015-04-10 MED ORDER — TBO-FILGRASTIM 300 MCG/0.5ML ~~LOC~~ SOSY
300.0000 ug | PREFILLED_SYRINGE | Freq: Once | SUBCUTANEOUS | Status: AC
  Administered 2015-04-10: 300 ug via SUBCUTANEOUS

## 2015-04-15 ENCOUNTER — Ambulatory Visit (HOSPITAL_BASED_OUTPATIENT_CLINIC_OR_DEPARTMENT_OTHER): Payer: BC Managed Care – PPO | Admitting: Nurse Practitioner

## 2015-04-15 ENCOUNTER — Encounter: Payer: Self-pay | Admitting: Nurse Practitioner

## 2015-04-15 ENCOUNTER — Telehealth: Payer: Self-pay | Admitting: Nurse Practitioner

## 2015-04-15 ENCOUNTER — Ambulatory Visit (HOSPITAL_BASED_OUTPATIENT_CLINIC_OR_DEPARTMENT_OTHER): Payer: BC Managed Care – PPO

## 2015-04-15 ENCOUNTER — Encounter: Payer: Self-pay | Admitting: *Deleted

## 2015-04-15 ENCOUNTER — Other Ambulatory Visit: Payer: Self-pay | Admitting: Nurse Practitioner

## 2015-04-15 ENCOUNTER — Other Ambulatory Visit (HOSPITAL_BASED_OUTPATIENT_CLINIC_OR_DEPARTMENT_OTHER): Payer: BC Managed Care – PPO

## 2015-04-15 VITALS — BP 122/67 | HR 89 | Temp 98.6°F | Resp 18 | Ht 66.0 in | Wt 216.8 lb

## 2015-04-15 DIAGNOSIS — Z5111 Encounter for antineoplastic chemotherapy: Secondary | ICD-10-CM

## 2015-04-15 DIAGNOSIS — C50411 Malignant neoplasm of upper-outer quadrant of right female breast: Secondary | ICD-10-CM

## 2015-04-15 DIAGNOSIS — C50811 Malignant neoplasm of overlapping sites of right female breast: Secondary | ICD-10-CM

## 2015-04-15 DIAGNOSIS — G35 Multiple sclerosis: Secondary | ICD-10-CM

## 2015-04-15 DIAGNOSIS — I10 Essential (primary) hypertension: Secondary | ICD-10-CM

## 2015-04-15 LAB — CBC WITH DIFFERENTIAL/PLATELET
BASO%: 0.2 % (ref 0.0–2.0)
BASOS ABS: 0 10*3/uL (ref 0.0–0.1)
EOS%: 0.9 % (ref 0.0–7.0)
Eosinophils Absolute: 0 10*3/uL (ref 0.0–0.5)
HCT: 33.6 % — ABNORMAL LOW (ref 34.8–46.6)
HEMOGLOBIN: 11.5 g/dL — AB (ref 11.6–15.9)
LYMPH%: 38.9 % (ref 14.0–49.7)
MCH: 31.2 pg (ref 25.1–34.0)
MCHC: 34.2 g/dL (ref 31.5–36.0)
MCV: 91.1 fL (ref 79.5–101.0)
MONO#: 0.7 10*3/uL (ref 0.1–0.9)
MONO%: 15.6 % — ABNORMAL HIGH (ref 0.0–14.0)
NEUT%: 44.4 % (ref 38.4–76.8)
NEUTROS ABS: 1.9 10*3/uL (ref 1.5–6.5)
PLATELETS: 156 10*3/uL (ref 145–400)
RBC: 3.69 10*6/uL — AB (ref 3.70–5.45)
RDW: 19.4 % — ABNORMAL HIGH (ref 11.2–14.5)
WBC: 4.4 10*3/uL (ref 3.9–10.3)
lymph#: 1.7 10*3/uL (ref 0.9–3.3)

## 2015-04-15 LAB — COMPREHENSIVE METABOLIC PANEL (CC13)
ALK PHOS: 98 U/L (ref 40–150)
ALT: 19 U/L (ref 0–55)
AST: 21 U/L (ref 5–34)
Albumin: 3.8 g/dL (ref 3.5–5.0)
Anion Gap: 8 mEq/L (ref 3–11)
BUN: 12 mg/dL (ref 7.0–26.0)
CO2: 25 meq/L (ref 22–29)
CREATININE: 0.7 mg/dL (ref 0.6–1.1)
Calcium: 9.6 mg/dL (ref 8.4–10.4)
Chloride: 106 mEq/L (ref 98–109)
Glucose: 106 mg/dl (ref 70–140)
Potassium: 3.9 mEq/L (ref 3.5–5.1)
SODIUM: 139 meq/L (ref 136–145)
TOTAL PROTEIN: 6.6 g/dL (ref 6.4–8.3)
Total Bilirubin: 0.51 mg/dL (ref 0.20–1.20)

## 2015-04-15 MED ORDER — FAMOTIDINE IN NACL 20-0.9 MG/50ML-% IV SOLN
INTRAVENOUS | Status: AC
  Filled 2015-04-15: qty 50

## 2015-04-15 MED ORDER — FAMOTIDINE IN NACL 20-0.9 MG/50ML-% IV SOLN
20.0000 mg | Freq: Once | INTRAVENOUS | Status: AC
  Administered 2015-04-15: 20 mg via INTRAVENOUS

## 2015-04-15 MED ORDER — DIPHENHYDRAMINE HCL 50 MG/ML IJ SOLN
25.0000 mg | Freq: Once | INTRAMUSCULAR | Status: AC
  Administered 2015-04-15: 25 mg via INTRAVENOUS

## 2015-04-15 MED ORDER — DIPHENHYDRAMINE HCL 50 MG/ML IJ SOLN
INTRAMUSCULAR | Status: AC
  Filled 2015-04-15: qty 1

## 2015-04-15 MED ORDER — CARBOPLATIN CHEMO INJECTION 450 MG/45ML
300.0000 mg | Freq: Once | INTRAVENOUS | Status: AC
  Administered 2015-04-15: 300 mg via INTRAVENOUS
  Filled 2015-04-15: qty 30

## 2015-04-15 MED ORDER — HEPARIN SOD (PORK) LOCK FLUSH 100 UNIT/ML IV SOLN
500.0000 [IU] | Freq: Once | INTRAVENOUS | Status: AC | PRN
  Administered 2015-04-15: 500 [IU]
  Filled 2015-04-15: qty 5

## 2015-04-15 MED ORDER — SODIUM CHLORIDE 0.9 % IV SOLN
Freq: Once | INTRAVENOUS | Status: AC
  Administered 2015-04-15: 11:00:00 via INTRAVENOUS
  Filled 2015-04-15: qty 8

## 2015-04-15 MED ORDER — SODIUM CHLORIDE 0.9 % IJ SOLN
10.0000 mL | INTRAMUSCULAR | Status: DC | PRN
  Administered 2015-04-15: 10 mL
  Filled 2015-04-15: qty 10

## 2015-04-15 MED ORDER — SODIUM CHLORIDE 0.9 % IV SOLN
Freq: Once | INTRAVENOUS | Status: AC
  Administered 2015-04-15: 11:00:00 via INTRAVENOUS

## 2015-04-15 MED ORDER — PACLITAXEL CHEMO INJECTION 300 MG/50ML
68.0000 mg/m2 | Freq: Once | INTRAVENOUS | Status: AC
  Administered 2015-04-15: 144 mg via INTRAVENOUS
  Filled 2015-04-15: qty 24

## 2015-04-15 NOTE — Telephone Encounter (Signed)
Appointments made and avs printed for patient °

## 2015-04-15 NOTE — Patient Instructions (Signed)
[facility] Discharge Instructions for Patients Receiving Chemotherapy  Today you received the following chemotherapy agents Taxol/Carboplatin To help prevent nausea and vomiting after your treatment, we encourage you to take your nausea medication as prescribed.   If you develop nausea and vomiting that is not controlled by your nausea medication, call the clinic.   BELOW ARE SYMPTOMS THAT SHOULD BE REPORTED IMMEDIATELY:  *FEVER GREATER THAN 100.5 F  *CHILLS WITH OR WITHOUT FEVER  NAUSEA AND VOMITING THAT IS NOT CONTROLLED WITH YOUR NAUSEA MEDICATION  *UNUSUAL SHORTNESS OF BREATH  *UNUSUAL BRUISING OR BLEEDING  TENDERNESS IN MOUTH AND THROAT WITH OR WITHOUT PRESENCE OF ULCERS  *URINARY PROBLEMS  *BOWEL PROBLEMS  UNUSUAL RASH Items with * indicate a potential emergency and should be followed up as soon as possible.  Feel free to call the clinic you have any questions or concerns. The clinic phone number is [phone].  Please show the CHEMO ALERT CARD at check-in to the Emergency Department and triage nurse.   

## 2015-04-15 NOTE — Progress Notes (Signed)
[facility]  [phone] [phone]     ID: Daksha Koone DOB: October 06, 1960  MR#: [id number]  OFH#:[id number]  Patient Care Team: [name], MD as PCP - General [name], MD as Consulting Physician (Obstetrics and Gynecology) [name], MD (Gastroenterology) [name]. [name], MD (Neurology) [name], MD as Consulting Physician (General Surgery) [name], MD as Consulting Physician (Oncology) [name], MD as Attending Physician (Radiation Oncology) [name], RN as Registered Nurse [name], RN as Registered Nurse OTHER MD:  CHIEF COMPLAINT: Triple negative breast cancer  CURRENT TREATMENT: Neoadjuvant chemotherapy  BREAST CANCER HISTORY: From the original intake note:  [name] [name] had bilateral screening mammography at the [facility] 11/21/2013. Breast density was category C. Exam was unremarkable. In January 2016 however the patient palpated a change in her right breast laterally. She contacted her gynecologist, Dr. [name], and he set her up for a unilateral right diagnostic mammography with right breast ultrasonography at [facility] 10/15/2014. There was a focal irregular opacity in the lateral right breast associated with pleomorphic calcifications. This was palpable on exam as a firm non-mobile mass. Ultrasound confirmed a hypoechoic mass in the right breast at the 9:00 position 5 cm from the nipple, measuring 2.9 cm. Next to this mass was a normal-sized intramammary lymph node measuring 4 mm. In the right axilla there were 3 contiguous level I lymph nodes the largest measuring 11 mm.  On 10/20/2014 the patient underwent right core needle biopsy of the 9:00 breast mass, showing ([id number]) and invasive ductal carcinoma, grade 3, triple negative, with the HER-2/neu signals ratio of 1.26 and number per cell 2.15. The MIB-1 was 71%. Biopsy of the largest right axillary lymph node was negative. Dr.  Owens Shark the mammographer who performed a biopsy describes this is concordant.  Left breast mammography 10/26/2014 was negative, and bilateral breast MRI the same day confirmed, in the right breast, and upper outer quadrant mass measuring 2.5 cm, with a second mass measuring 1.4 cm slightly anterior and superior to it. In addition there was a total area of approximately 6.5 cm of non-masslike enhancement extending throughout the upper outer quadrant of the right breast. There was a cortically thickened right axillary lymph node which had been biopsied. There was also a mildly cortically thickened right subpectoral lymph node. There were no internal mammary or left axillary lymph nodes in the left breast was unremarkable.  The patient's subsequent history is as detailed below.  INTERVAL HISTORY: [name] [name] returns today for follow up of her breast cancer. Today day 1, cycle 11 of 12 planned cycles of paclitaxel and carboplatin, given weekly. She received 2 neupogen injections last week to prevent recurrent neutropenias and will continue this intervention through the end of this regimen.   REVIEW OF SYSTEMS: [name] [name] denies changes this week. 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 She denies fevers, chills, nausea, vomiting, or changes in bowel or bladder habits. Her appetite is healthy and she is drinking well. She has good energy. She denies headaches, dizziness, vision changes, or weakness. A detailed review of systems is otherwise stable.  PAST MEDICAL HISTORY: Past Medical History  Diagnosis Date  . METABOLIC SYNDROME X   . OBESITY, TRUNCAL   . CONSTIPATION, CHRONIC   . KELOID   . Multiple sclerosis   . Allergic rhinitis due to other allergen   .  HYPERTENSION   . HYPERLIPIDEMIA   . GERD   . Angioedema     ACEI  . Colon polyps 05/2014    adenomatous, colo q 5y  . Migraine headache   .  Allergy   . Anxiety   . Breast cancer of upper-outer quadrant of right female breast 10/23/2014  . Hot flashes     PAST SURGICAL HISTORY: Past Surgical History  Procedure Laterality Date  . Tubal ligation    . Colonoscopy    . Portacath placement Left 11/06/2014    Procedure: INSERTION PORT-A-CATH;  Surgeon: [name], MD;  Location: [facility];  Service: General;  Laterality: Left;    FAMILY HISTORY Family History  Problem Relation Age of Onset  . Coronary artery disease Mother   . Heart disease Mother   . Diabetes Mother   . Hypertension Other   . Hyperlipidemia Other   . Diabetes Other   . Colon cancer Neg Hx   . Alcohol abuse Father   . COPD Maternal Grandmother    the patient's father died at the age of 98 following his seizure. The patient's mother died from congestive 7 failure at the age of 36. The patient had 5 brothers and 2 sisters. One brother died from Escherichia coli infection, the other one had a history of seizure disorder. One sister died from causes unknown to the patient. There is no history of breast or ovarian cancer in the family.  GYNECOLOGIC HISTORY:  Patient's last menstrual period was 11/01/2012. Menarche age 48, first live birth age 22. The patient is GX P2. She stopped having periods in May 2014. She did not take hormone replacement. She did take her control pills remotely for approximately 4 years with no complications.  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 She describes herself is single, and lives by herself. Her daughter [name] lives in [place] and works in Ambulance person for [employer]. Son Stuart "[name]" Hasler is an Engineer, building services for [employer]: Not in place   HEALTH MAINTENANCE: History  Substance Use Topics  . Smoking status: Never Smoker   . Smokeless tobacco: Never Used     Comment: Single- brother staying  with pt since mom's death  . Alcohol Use: No     Colonoscopy: September 2015/[name]  PAP: February 2015  Bone density:  Lipid panel:  Allergies  Allergen Reactions  . Ace Inhibitors Anaphylaxis    REACTION: Angioedema (tongue swelling)  . Clarithromycin Anaphylaxis    Throat swells  . Levaquin [Levofloxacin] Other (See Comments)    Tendon pain - shoulder and calf    Current Outpatient Prescriptions  Medication Sig Dispense Refill  . ALPRAZolam (XANAX) 0.5 MG tablet Take 1 tablet (0.5 mg total) by mouth 3 (three) times daily as needed for anxiety or sleep. 30 tablet 1  . Glatiramer Acetate (COPAXONE) 40 MG/ML SOSY Inject 40 mg into the skin 3 (three) times a week. 12 Syringe 0  . hydrochlorothiazide (HYDRODIURIL) 25 MG tablet TAKE 1 TABLET DAILY 90 tablet 3  . lidocaine-prilocaine (EMLA) cream Apply to affected area once 30 g 3  . loratadine-pseudoephedrine (CLARITIN-D 12 HOUR) 5-120 MG per tablet Take 1 tablet by mouth as needed for allergies. 30 tablet 3  . losartan (COZAAR) 50 MG tablet Take 1 tablet (50 mg total) by mouth 2 (two) times daily. 180 tablet 3  . metoprolol (LOPRESSOR) 50 MG tablet  TAKE 1 TABLET TWICE A DAY 180 tablet 3  . pantoprazole (PROTONIX) 40 MG tablet Take 1 tablet (40 mg total) by mouth daily. 90 tablet 1  . potassium chloride (MICRO-K) 10 MEQ CR capsule Take 1 capsule (10 mEq total) by mouth 2 (two) times daily. Pt taking 2 - 10 mEq tablets 2 times a day. Total dose 40 mEq daily. 180 capsule 0  . [DISCONTINUED] mometasone (NASONEX) 50 MCG/ACT nasal spray Place 2 sprays into the nose daily. 17 g 2   No current facility-administered medications for this visit.   Facility-Administered Medications Ordered in Other Visits  Medication Dose Route Frequency Provider Last Rate Last Dose  . sodium chloride 0.9 % injection 10 mL  10 mL Intracatheter PRN Genelle Gather Triniti Gruetzmacher, NP      . sodium chloride 0.9 % injection 10 mL  10 mL Intracatheter PRN Laurie Panda,  NP        OBJECTIVE: Middle-aged African-American woman who appears stated age 54 Vitals:   04/15/15 0929  BP: 122/67  [name]: 89  Temp: 98.6 F (37 C)  Resp: 18     Body mass index is 35.01 kg/(m^2).    ECOG FS:0 - Asymptomatic   Sclerae unicteric, pupils round and equal Oropharynx clear and moist-- no thrush or other lesions No cervical or supraclavicular adenopathy Lungs no rales or rhonchi Heart regular rate and rhythm Abd soft, nontender, positive bowel sounds MSK no focal spinal tenderness, no upper extremity lymphedema Neuro: nonfocal, well oriented, appropriate affect Breasts: deferred   LAB RESULTS:  CMP     Component Value Date/Time   NA 139 04/15/2015 0859   NA 133* 12/31/2014 1102   K 3.9 04/15/2015 0859   K 2.7* 12/31/2014 1102   CL 97 12/31/2014 1102   CO2 25 04/15/2015 0859   CO2 26 12/31/2014 1102   GLUCOSE 106 04/15/2015 0859   GLUCOSE 129* 12/31/2014 1102   BUN 12.0 04/15/2015 0859   BUN 9 12/31/2014 1102   CREATININE 0.7 04/15/2015 0859   CREATININE 0.82 12/31/2014 1102   CALCIUM 9.6 04/15/2015 0859   CALCIUM 9.0 12/31/2014 1102   PROT 6.6 04/15/2015 0859   PROT 7.3 12/31/2014 1102   ALBUMIN 3.8 04/15/2015 0859   ALBUMIN 4.0 12/31/2014 1102   AST 21 04/15/2015 0859   AST 17 12/31/2014 1102   ALT 19 04/15/2015 0859   ALT 13 12/31/2014 1102   ALKPHOS 98 04/15/2015 0859   ALKPHOS 101 12/31/2014 1102   BILITOT 0.51 04/15/2015 0859   BILITOT 0.4 12/31/2014 1102   GFRNONAA 81* 12/31/2014 1102   GFRAA >90 12/31/2014 1102    INo results found for: SPEP, UPEP  Lab Results  Component Value Date   WBC 4.4 04/15/2015   NEUTROABS 1.9 04/15/2015   HGB 11.5* 04/15/2015   HCT 33.6* 04/15/2015   MCV 91.1 04/15/2015   PLT 156 04/15/2015      Chemistry      Component Value Date/Time   NA 139 04/15/2015 0859   NA 133* 12/31/2014 1102   K 3.9 04/15/2015 0859   K 2.7* 12/31/2014 1102   CL 97 12/31/2014 1102   CO2 25 04/15/2015 0859   CO2  26 12/31/2014 1102   BUN 12.0 04/15/2015 0859   BUN 9 12/31/2014 1102   CREATININE 0.7 04/15/2015 0859   CREATININE 0.82 12/31/2014 1102      Component Value Date/Time   CALCIUM 9.6 04/15/2015 0859   CALCIUM 9.0 12/31/2014 1102   ALKPHOS  98 04/15/2015 0859   ALKPHOS 101 12/31/2014 1102   AST 21 04/15/2015 0859   AST 17 12/31/2014 1102   ALT 19 04/15/2015 0859   ALT 13 12/31/2014 1102   BILITOT 0.51 04/15/2015 0859   BILITOT 0.4 12/31/2014 1102       No results found for: LABCA2  No components found for: LABCA125  No results for input(s): INR in the last 168 hours.  Urinalysis    Component Value Date/Time   COLORURINE YELLOW 01/14/2014 1206   APPEARANCEUR CLEAR 01/14/2014 1206   LABSPEC <=1.005* 01/14/2014 1206   PHURINE 5.5 01/14/2014 1206   GLUCOSEU NEGATIVE 01/14/2014 1206   HGBUR NEGATIVE 01/14/2014 1206   HGBUR negative 08/19/2010 1041   BILIRUBINUR NEGATIVE 01/14/2014 1206   KETONESUR NEGATIVE 01/14/2014 1206   UROBILINOGEN 0.2 01/14/2014 1206   NITRITE NEGATIVE 01/14/2014 1206   LEUKOCYTESUR NEGATIVE 01/14/2014 1206    STUDIES: No results found.  ASSESSMENT: 54 y.o. 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   2) neoadjuvant chemotherapy to consist of cyclophosphamide and doxorubicin in dose dense fashion 4 given with Neulasta support, completed 01/06/2015, followed by weekly carboplatin and paclitaxel 12  (3) definitive surgery to follow  chemotherapy  (4) adjuvant radiation, if appropriate, to follow surgery  PLAN: [name] [name] is in excellent spirits today. The labs were reviewed in detail and her ANC is 1.9 today with just 2 neupogen injections. This value has trended downwards consistently over the past 3 weeks. This week she will proceed with cycle 11 of paclitaxel and carboplatin as planned, but we will increase her neuopgen injection to 3 to ensure that she can complete chemo on time.    [name] [name] will return in 1 week for her 12th and final treatment. She understands and agrees with this plan. She knows the goal of treatment in her case is cure. She has been encouraged to call with any issues that might arise before her next visit here.     Laurie Panda, NP   04/15/2015 10:27 AM

## 2015-04-16 ENCOUNTER — Ambulatory Visit (HOSPITAL_BASED_OUTPATIENT_CLINIC_OR_DEPARTMENT_OTHER): Payer: BC Managed Care – PPO

## 2015-04-16 VITALS — BP 119/79 | HR 87 | Temp 98.0°F | Resp 18

## 2015-04-16 DIAGNOSIS — C50811 Malignant neoplasm of overlapping sites of right female breast: Secondary | ICD-10-CM | POA: Diagnosis not present

## 2015-04-16 DIAGNOSIS — Z5189 Encounter for other specified aftercare: Secondary | ICD-10-CM | POA: Diagnosis not present

## 2015-04-16 DIAGNOSIS — C50411 Malignant neoplasm of upper-outer quadrant of right female breast: Secondary | ICD-10-CM

## 2015-04-16 MED ORDER — TBO-FILGRASTIM 300 MCG/0.5ML ~~LOC~~ SOSY
300.0000 ug | PREFILLED_SYRINGE | Freq: Once | SUBCUTANEOUS | Status: AC
  Administered 2015-04-16: 300 ug via SUBCUTANEOUS
  Filled 2015-04-16: qty 0.5

## 2015-04-16 NOTE — Patient Instructions (Signed)

## 2015-04-17 ENCOUNTER — Ambulatory Visit (HOSPITAL_BASED_OUTPATIENT_CLINIC_OR_DEPARTMENT_OTHER): Payer: BC Managed Care – PPO

## 2015-04-17 VITALS — BP 118/66 | HR 94 | Temp 98.2°F | Resp 18

## 2015-04-17 DIAGNOSIS — C50811 Malignant neoplasm of overlapping sites of right female breast: Secondary | ICD-10-CM | POA: Diagnosis not present

## 2015-04-17 DIAGNOSIS — C50411 Malignant neoplasm of upper-outer quadrant of right female breast: Secondary | ICD-10-CM

## 2015-04-17 DIAGNOSIS — Z5189 Encounter for other specified aftercare: Secondary | ICD-10-CM | POA: Diagnosis not present

## 2015-04-17 MED ORDER — TBO-FILGRASTIM 300 MCG/0.5ML ~~LOC~~ SOSY
300.0000 ug | PREFILLED_SYRINGE | Freq: Once | SUBCUTANEOUS | Status: AC
  Administered 2015-04-17: 300 ug via SUBCUTANEOUS

## 2015-04-18 ENCOUNTER — Other Ambulatory Visit: Payer: BC Managed Care – PPO

## 2015-04-19 ENCOUNTER — Ambulatory Visit (HOSPITAL_BASED_OUTPATIENT_CLINIC_OR_DEPARTMENT_OTHER): Payer: BC Managed Care – PPO

## 2015-04-19 VITALS — BP 108/85 | HR 110 | Temp 98.9°F | Resp 20

## 2015-04-19 DIAGNOSIS — C50811 Malignant neoplasm of overlapping sites of right female breast: Secondary | ICD-10-CM | POA: Diagnosis not present

## 2015-04-19 DIAGNOSIS — C50411 Malignant neoplasm of upper-outer quadrant of right female breast: Secondary | ICD-10-CM

## 2015-04-19 DIAGNOSIS — Z5189 Encounter for other specified aftercare: Secondary | ICD-10-CM

## 2015-04-19 MED ORDER — TBO-FILGRASTIM 300 MCG/0.5ML ~~LOC~~ SOSY
300.0000 ug | PREFILLED_SYRINGE | Freq: Once | SUBCUTANEOUS | Status: AC
  Administered 2015-04-19: 300 ug via SUBCUTANEOUS
  Filled 2015-04-19: qty 0.5

## 2015-04-19 NOTE — Progress Notes (Signed)
Day 3 of Granix given.

## 2015-04-19 NOTE — Patient Instructions (Signed)

## 2015-04-21 ENCOUNTER — Other Ambulatory Visit (HOSPITAL_BASED_OUTPATIENT_CLINIC_OR_DEPARTMENT_OTHER): Payer: BC Managed Care – PPO

## 2015-04-21 ENCOUNTER — Encounter: Payer: Self-pay | Admitting: Nurse Practitioner

## 2015-04-21 ENCOUNTER — Other Ambulatory Visit: Payer: Self-pay | Admitting: Oncology

## 2015-04-21 ENCOUNTER — Encounter: Payer: Self-pay | Admitting: *Deleted

## 2015-04-21 ENCOUNTER — Ambulatory Visit (HOSPITAL_BASED_OUTPATIENT_CLINIC_OR_DEPARTMENT_OTHER): Payer: BC Managed Care – PPO

## 2015-04-21 ENCOUNTER — Ambulatory Visit (HOSPITAL_BASED_OUTPATIENT_CLINIC_OR_DEPARTMENT_OTHER): Payer: BC Managed Care – PPO | Admitting: Nurse Practitioner

## 2015-04-21 VITALS — BP 129/78 | HR 108 | Temp 98.2°F | Resp 18 | Ht 66.0 in | Wt 214.6 lb

## 2015-04-21 DIAGNOSIS — Z5111 Encounter for antineoplastic chemotherapy: Secondary | ICD-10-CM

## 2015-04-21 DIAGNOSIS — D6959 Other secondary thrombocytopenia: Secondary | ICD-10-CM | POA: Diagnosis not present

## 2015-04-21 DIAGNOSIS — G620 Drug-induced polyneuropathy: Secondary | ICD-10-CM | POA: Diagnosis not present

## 2015-04-21 DIAGNOSIS — G35 Multiple sclerosis: Secondary | ICD-10-CM

## 2015-04-21 DIAGNOSIS — C50811 Malignant neoplasm of overlapping sites of right female breast: Secondary | ICD-10-CM

## 2015-04-21 DIAGNOSIS — C50411 Malignant neoplasm of upper-outer quadrant of right female breast: Secondary | ICD-10-CM

## 2015-04-21 DIAGNOSIS — T451X5A Adverse effect of antineoplastic and immunosuppressive drugs, initial encounter: Secondary | ICD-10-CM

## 2015-04-21 DIAGNOSIS — I10 Essential (primary) hypertension: Secondary | ICD-10-CM

## 2015-04-21 LAB — CBC WITH DIFFERENTIAL/PLATELET
BASO%: 0.7 % (ref 0.0–2.0)
Basophils Absolute: 0 10*3/uL (ref 0.0–0.1)
EOS%: 1.1 % (ref 0.0–7.0)
Eosinophils Absolute: 0.1 10*3/uL (ref 0.0–0.5)
HCT: 31.8 % — ABNORMAL LOW (ref 34.8–46.6)
HGB: 10.8 g/dL — ABNORMAL LOW (ref 11.6–15.9)
LYMPH#: 1.5 10*3/uL (ref 0.9–3.3)
LYMPH%: 32.6 % (ref 14.0–49.7)
MCH: 31.5 pg (ref 25.1–34.0)
MCHC: 33.9 g/dL (ref 31.5–36.0)
MCV: 92.9 fL (ref 79.5–101.0)
MONO#: 0.6 10*3/uL (ref 0.1–0.9)
MONO%: 12.1 % (ref 0.0–14.0)
NEUT#: 2.5 10*3/uL (ref 1.5–6.5)
NEUT%: 53.5 % (ref 38.4–76.8)
PLATELETS: 72 10*3/uL — AB (ref 145–400)
RBC: 3.42 10*6/uL — AB (ref 3.70–5.45)
RDW: 21.5 % — ABNORMAL HIGH (ref 11.2–14.5)
WBC: 4.7 10*3/uL (ref 3.9–10.3)

## 2015-04-21 LAB — COMPREHENSIVE METABOLIC PANEL (CC13)
ALBUMIN: 4.1 g/dL (ref 3.5–5.0)
ALK PHOS: 95 U/L (ref 40–150)
ALT: 22 U/L (ref 0–55)
ANION GAP: 9 meq/L (ref 3–11)
AST: 18 U/L (ref 5–34)
BILIRUBIN TOTAL: 0.88 mg/dL (ref 0.20–1.20)
BUN: 11 mg/dL (ref 7.0–26.0)
CHLORIDE: 104 meq/L (ref 98–109)
CO2: 27 meq/L (ref 22–29)
CREATININE: 0.7 mg/dL (ref 0.6–1.1)
Calcium: 9.3 mg/dL (ref 8.4–10.4)
EGFR: >90 mL/min/{1.73_m2} (ref >90)
Glucose: 114 mg/dl (ref 70–140)
Potassium: 3.3 mEq/L — ABNORMAL LOW (ref 3.5–5.1)
SODIUM: 140 meq/L (ref 136–145)
Total Protein: 6.9 g/dL (ref 6.4–8.3)

## 2015-04-21 MED ORDER — DIPHENHYDRAMINE HCL 50 MG/ML IJ SOLN
INTRAMUSCULAR | Status: AC
  Filled 2015-04-21: qty 1

## 2015-04-21 MED ORDER — FAMOTIDINE IN NACL 20-0.9 MG/50ML-% IV SOLN
INTRAVENOUS | Status: AC
  Filled 2015-04-21: qty 50

## 2015-04-21 MED ORDER — SODIUM CHLORIDE 0.9 % IV SOLN
Freq: Once | INTRAVENOUS | Status: AC
  Administered 2015-04-21: 13:00:00 via INTRAVENOUS
  Filled 2015-04-21: qty 8

## 2015-04-21 MED ORDER — SODIUM CHLORIDE 0.9 % IV SOLN
Freq: Once | INTRAVENOUS | Status: AC
  Administered 2015-04-21: 13:00:00 via INTRAVENOUS

## 2015-04-21 MED ORDER — PACLITAXEL CHEMO INJECTION 300 MG/50ML
68.0000 mg/m2 | Freq: Once | INTRAVENOUS | Status: AC
  Administered 2015-04-21: 144 mg via INTRAVENOUS
  Filled 2015-04-21: qty 24

## 2015-04-21 MED ORDER — SODIUM CHLORIDE 0.9 % IJ SOLN
10.0000 mL | INTRAMUSCULAR | Status: DC | PRN
  Administered 2015-04-21: 10 mL
  Filled 2015-04-21: qty 10

## 2015-04-21 MED ORDER — FAMOTIDINE IN NACL 20-0.9 MG/50ML-% IV SOLN
20.0000 mg | Freq: Once | INTRAVENOUS | Status: AC
  Administered 2015-04-21: 20 mg via INTRAVENOUS

## 2015-04-21 MED ORDER — SODIUM CHLORIDE 0.9 % IV SOLN
300.0000 mg | Freq: Once | INTRAVENOUS | Status: AC
  Administered 2015-04-21: 300 mg via INTRAVENOUS
  Filled 2015-04-21: qty 30

## 2015-04-21 MED ORDER — HEPARIN SOD (PORK) LOCK FLUSH 100 UNIT/ML IV SOLN
500.0000 [IU] | Freq: Once | INTRAVENOUS | Status: AC | PRN
  Administered 2015-04-21: 500 [IU]
  Filled 2015-04-21: qty 5

## 2015-04-21 MED ORDER — DIPHENHYDRAMINE HCL 50 MG/ML IJ SOLN
25.0000 mg | Freq: Once | INTRAMUSCULAR | Status: AC
  Administered 2015-04-21: 25 mg via INTRAVENOUS

## 2015-04-21 NOTE — Progress Notes (Signed)
Labs reviewed, Okay to proceed with treatment per Gentry Fitz, NP.

## 2015-04-21 NOTE — Patient Instructions (Signed)
[facility] Discharge Instructions for Patients Receiving Chemotherapy  Today you received the following chemotherapy agents Taxol/Carboplatin To help prevent nausea and vomiting after your treatment, we encourage you to take your nausea medication as prescribed.   If you develop nausea and vomiting that is not controlled by your nausea medication, call the clinic.   BELOW ARE SYMPTOMS THAT SHOULD BE REPORTED IMMEDIATELY:  *FEVER GREATER THAN 100.5 F  *CHILLS WITH OR WITHOUT FEVER  NAUSEA AND VOMITING THAT IS NOT CONTROLLED WITH YOUR NAUSEA MEDICATION  *UNUSUAL SHORTNESS OF BREATH  *UNUSUAL BRUISING OR BLEEDING  TENDERNESS IN MOUTH AND THROAT WITH OR WITHOUT PRESENCE OF ULCERS  *URINARY PROBLEMS  *BOWEL PROBLEMS  UNUSUAL RASH Items with * indicate a potential emergency and should be followed up as soon as possible.  Feel free to call the clinic you have any questions or concerns. The clinic phone number is [phone].  Please show the CHEMO ALERT CARD at check-in to the Emergency Department and triage nurse.   

## 2015-04-21 NOTE — Progress Notes (Signed)
[place]  [phone] Fax:([phone]) [phone]     ID: Dea Bitting DOB: 11-Jul-1961  MR#: [id number]  ONG#:[id number]  Patient Care Team: [name], MD as PCP - General [name], MD as Consulting Physician (Obstetrics and Gynecology) [name], MD (Gastroenterology) [name]. [name], MD (Neurology) [name], MD as Consulting Physician (General Surgery) [name], MD as Consulting Physician (Oncology) [name], MD as Attending Physician (Radiation Oncology) [name], RN as Registered Nurse [name], RN as Registered Nurse OTHER MD:  CHIEF COMPLAINT: Triple negative breast cancer  CURRENT TREATMENT: Neoadjuvant chemotherapy  BREAST CANCER HISTORY: From the original intake note:  [name] had bilateral screening mammography at the [facility] Center 11/21/2013. Breast density was category C. Exam was unremarkable. In January 2016 however the patient palpated a change in her right breast laterally. She contacted her gynecologist, Dr. [name], and he set her up for a unilateral right diagnostic mammography with right breast ultrasonography at [facility] 10/15/2014. There was a focal irregular opacity in the lateral right breast associated with pleomorphic calcifications. This was palpable on exam as a firm non-mobile mass. Ultrasound confirmed a hypoechoic mass in the right breast at the 9:00 position 5 cm from the nipple, measuring 2.9 cm. Next to this mass was a normal-sized intramammary lymph node measuring 4 mm. In the right axilla there were 3 contiguous level I lymph nodes the largest measuring 11 mm.  On 10/20/2014 the patient underwent right core needle biopsy of the 9:00 breast mass, showing ([id number]) and invasive ductal carcinoma, grade 3, triple negative, with the HER-2/neu signals ratio of 1.26 and number per cell 2.15. The MIB-1 was 71%. Biopsy of the largest right axillary lymph node was negative. Dr.  Owens Shark the mammographer who performed a biopsy describes this is concordant.  Left breast mammography 10/26/2014 was negative, and bilateral breast MRI the same day confirmed, in the right breast, and upper outer quadrant mass measuring 2.5 cm, with a second mass measuring 1.4 cm slightly anterior and superior to it. In addition there was a total area of approximately 6.5 cm of non-masslike enhancement extending throughout the upper outer quadrant of the right breast. There was a cortically thickened right axillary lymph node which had been biopsied. There was also a mildly cortically thickened right subpectoral lymph node. There were no internal mammary or left axillary lymph nodes in the left breast was unremarkable.  The patient's subsequent history is as detailed below.  INTERVAL HISTORY: [name] returns today for follow up of her breast cancer. Today day 1, cycle 12 of 12 planned cycles of paclitaxel and carboplatin, given weekly. She received 3 neupogen injections this past week because of recurrent neutropenias.  REVIEW OF SYSTEMS: [name] denies fevers, chills, nausea, or vomiting. She had diarrhea for the past few days, but has not taken any imodium for this. She had some mild nosebleeds but these have resolved. 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 It does not interfere with her fine motor skills. She is eating and drinking well. Her energy level is good. She denies headaches, dizziness, vision changes, or weakness. A detailed review of systems is otherwise stable.  PAST MEDICAL HISTORY: Past Medical History  Diagnosis Date  . METABOLIC SYNDROME X   . OBESITY, TRUNCAL   . CONSTIPATION, CHRONIC   . KELOID   . Multiple sclerosis   . Allergic rhinitis due to other allergen   . HYPERTENSION   .  HYPERLIPIDEMIA   . GERD   . Angioedema     ACEI  . Colon polyps 05/2014    adenomatous, colo q 5y  . Migraine headache   . Allergy   . Anxiety   . Breast cancer of  upper-outer quadrant of right female breast 10/23/2014  . Hot flashes     PAST SURGICAL HISTORY: Past Surgical History  Procedure Laterality Date  . Tubal ligation    . Colonoscopy    . Portacath placement Left 11/06/2014    Procedure: INSERTION PORT-A-CATH;  Surgeon: [name], MD;  Location: [facility];  Service: General;  Laterality: Left;    FAMILY HISTORY Family History  Problem Relation Age of Onset  . Coronary artery disease Mother   . Heart disease Mother   . Diabetes Mother   . Hypertension Other   . Hyperlipidemia Other   . Diabetes Other   . Colon cancer Neg Hx   . Alcohol abuse Father   . COPD Maternal Grandmother    the patient's father died at the age of 57 following his seizure. The patient's mother died from congestive 56 failure at the age of 70. The patient had 5 brothers and 2 sisters. One brother died from Escherichia coli infection, the other one had a history of seizure disorder. One sister died from causes unknown to the patient. There is no history of breast or ovarian cancer in the family.  GYNECOLOGIC HISTORY:  Patient's last menstrual period was 11/01/2012. Menarche age 22, first live birth age 53. The patient is GX P2. She stopped having periods in May 2014. She did not take hormone replacement. She did take her control pills remotely for approximately 4 years with no complications.  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 She describes herself is single, and lives by herself. Her daughter [name] lives in [place] and works in Ambulance person for [employer]. Son [place] "[name]" Mossbarger is an Engineer, building services for [employer]: Not in place   HEALTH MAINTENANCE: History  Substance Use Topics  . Smoking status: Never Smoker   . Smokeless tobacco: Never Used     Comment: Single- brother staying with pt since mom's death  . Alcohol Use: No      Colonoscopy: September 2015/[name]  PAP: February 2015  Bone density:  Lipid panel:  Allergies  Allergen Reactions  . Ace Inhibitors Anaphylaxis    REACTION: Angioedema (tongue swelling)  . Clarithromycin Anaphylaxis    Throat swells  . Levaquin [Levofloxacin] Other (See Comments)    Tendon pain - shoulder and calf    Current Outpatient Prescriptions  Medication Sig Dispense Refill  . ALPRAZolam (XANAX) 0.5 MG tablet Take 1 tablet (0.5 mg total) by mouth 3 (three) times daily as needed for anxiety or sleep. 30 tablet 1  . Glatiramer Acetate (COPAXONE) 40 MG/ML SOSY Inject 40 mg into the skin 3 (three) times a week. 12 Syringe 0  . hydrochlorothiazide (HYDRODIURIL) 25 MG tablet TAKE 1 TABLET DAILY 90 tablet 3  . lidocaine-prilocaine (EMLA) cream Apply to affected area once 30 g 3  . losartan (COZAAR) 50 MG tablet Take 1 tablet (50 mg total) by mouth 2 (two) times daily. 180 tablet 3  . metoprolol (LOPRESSOR) 50 MG tablet TAKE 1 TABLET TWICE A DAY 180 tablet 3  . pantoprazole (PROTONIX) 40 MG tablet Take 1 tablet (40 mg total) by mouth daily. [place]  tablet 1  . potassium chloride (MICRO-K) 10 MEQ CR capsule Take 1 capsule (10 mEq total) by mouth 2 (two) times daily. Pt taking 2 - 10 mEq tablets 2 times a day. Total dose 40 mEq daily. 180 capsule 0  . loratadine-pseudoephedrine (CLARITIN-D 12 HOUR) 5-120 MG per tablet Take 1 tablet by mouth as needed for allergies. (Patient not taking: Reported on 04/21/2015) 30 tablet 3  . [DISCONTINUED] mometasone (NASONEX) 50 MCG/ACT nasal spray Place 2 sprays into the nose daily. 17 g 2   No current facility-administered medications for this visit.   Facility-Administered Medications Ordered in Other Visits  Medication Dose Route Frequency Provider Last Rate Last Dose  . sodium chloride 0.9 % injection 10 mL  10 mL Intracatheter PRN Genelle Gather Boelter, NP      . sodium chloride 0.9 % injection 10 mL  10 mL Intracatheter PRN Laurie Panda, NP         OBJECTIVE: Middle-aged African-American woman who appears stated age 54 Vitals:   04/21/15 1118  BP: 129/78  Pulse: 108  Temp: 98.2 F (36.8 C)  Resp: 18     Body mass index is 34.65 kg/(m^2).    ECOG FS:0 - Asymptomatic   Skin: warm, dry  HEENT: sclerae anicteric, conjunctivae pink, oropharynx clear. No thrush or mucositis.  Lymph Nodes: No cervical or supraclavicular lymphadenopathy  Lungs: clear to auscultation bilaterally, no rales, wheezes, or rhonci  Heart: regular rate and rhythm  Abdomen: round, soft, non tender, positive bowel sounds  Musculoskeletal: No focal spinal tenderness, no peripheral edema  Neuro: non focal, well oriented, positive affect  Breasts: deferred   LAB RESULTS:  CMP     Component Value Date/Time   NA 140 04/21/2015 1055   NA 133* 12/31/2014 1102   K 3.3* 04/21/2015 1055   K 2.7* 12/31/2014 1102   CL 97 12/31/2014 1102   CO2 27 04/21/2015 1055   CO2 26 12/31/2014 1102   GLUCOSE 114 04/21/2015 1055   GLUCOSE 129* 12/31/2014 1102   BUN 11.0 04/21/2015 1055   BUN 9 12/31/2014 1102   CREATININE 0.7 04/21/2015 1055   CREATININE 0.82 12/31/2014 1102   CALCIUM 9.3 04/21/2015 1055   CALCIUM 9.0 12/31/2014 1102   PROT 6.9 04/21/2015 1055   PROT 7.3 12/31/2014 1102   ALBUMIN 4.1 04/21/2015 1055   ALBUMIN 4.0 12/31/2014 1102   AST 18 04/21/2015 1055   AST 17 12/31/2014 1102   ALT 22 04/21/2015 1055   ALT 13 12/31/2014 1102   ALKPHOS 95 04/21/2015 1055   ALKPHOS 101 12/31/2014 1102   BILITOT 0.88 04/21/2015 1055   BILITOT 0.4 12/31/2014 1102   GFRNONAA 81* 12/31/2014 1102   GFRAA >90 12/31/2014 1102    INo results found for: SPEP, UPEP  Lab Results  Component Value Date   WBC 4.7 04/21/2015   NEUTROABS 2.5 04/21/2015   HGB 10.8* 04/21/2015   HCT 31.8* 04/21/2015   MCV 92.9 04/21/2015   PLT 72* 04/21/2015      Chemistry      Component Value Date/Time   NA 140 04/21/2015 1055   NA 133* 12/31/2014 1102   K 3.3*  04/21/2015 1055   K 2.7* 12/31/2014 1102   CL 97 12/31/2014 1102   CO2 27 04/21/2015 1055   CO2 26 12/31/2014 1102   BUN 11.0 04/21/2015 1055   BUN 9 12/31/2014 1102   CREATININE 0.7 04/21/2015 1055   CREATININE 0.82 12/31/2014 1102  Component Value Date/Time   CALCIUM 9.3 04/21/2015 1055   CALCIUM 9.0 12/31/2014 1102   ALKPHOS 95 04/21/2015 1055   ALKPHOS 101 12/31/2014 1102   AST 18 04/21/2015 1055   AST 17 12/31/2014 1102   ALT 22 04/21/2015 1055   ALT 13 12/31/2014 1102   BILITOT 0.88 04/21/2015 1055   BILITOT 0.4 12/31/2014 1102       No results found for: LABCA2  No components found for: LABCA125  No results for input(s): INR in the last 168 hours.  Urinalysis    Component Value Date/Time   COLORURINE YELLOW 01/14/2014 1206   APPEARANCEUR CLEAR 01/14/2014 1206   LABSPEC <=1.005* 01/14/2014 1206   PHURINE 5.5 01/14/2014 1206   GLUCOSEU NEGATIVE 01/14/2014 1206   HGBUR NEGATIVE 01/14/2014 1206   HGBUR negative 08/19/2010 1041   BILIRUBINUR NEGATIVE 01/14/2014 1206   KETONESUR NEGATIVE 01/14/2014 1206   UROBILINOGEN 0.2 01/14/2014 1206   NITRITE NEGATIVE 01/14/2014 1206   LEUKOCYTESUR NEGATIVE 01/14/2014 1206    STUDIES: No results found.  ASSESSMENT: 54 y.o. 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   2) neoadjuvant chemotherapy to consist of cyclophosphamide and doxorubicin in dose dense fashion 4 given with Neulasta support, completed 01/06/2015,  followed by weekly carboplatin and paclitaxel 12  (3) definitive surgery to follow chemotherapy  (4) adjuvant radiation, if appropriate, to follow surgery  PLAN: [name] is excited to be completing chemotherapy today. The labs were reviewed in detail, and new this week is thrombocytopenia with a platelet count of 72. As it is at least above 50, she will proceed with cycle 12 of paclitaxel and carboplatin as planned. We will not utilize the neupogen injections this week as she will not have surgery for several weeks.  Her neuropathy symptoms have been light, but she was able to complete chemotherapy. Hopefully the bulk of these symptoms will fade with time. I suggested she try a B-complex vitamin to aid in this process.  [name] will have a repeat breast MRI on 8/8, then meet with Dr. Donne Hazel to discuss surgery shortly after.   She will return at the end of September for a follow up visit with Dr. Jana Hakim. She understands and agrees with this plan. She knows the goal of treatment in her case is cure. She has been encouraged to call with any issues that might arise before her next visit here.     Laurie Panda, NP   04/21/2015 12:02 PM

## 2015-04-22 ENCOUNTER — Other Ambulatory Visit: Payer: Self-pay | Admitting: *Deleted

## 2015-04-22 DIAGNOSIS — C50411 Malignant neoplasm of upper-outer quadrant of right female breast: Secondary | ICD-10-CM

## 2015-04-22 MED ORDER — ALPRAZOLAM 0.5 MG PO TABS: 0.5000 mg | ORAL_TABLET | Freq: Three times a day (TID) | ORAL | Status: DC | PRN

## 2015-04-23 ENCOUNTER — Encounter: Payer: Self-pay | Admitting: Oncology

## 2015-04-23 NOTE — Progress Notes (Signed)
I placed fmla forms for her daughter on desk of nurse for dr.magrinat

## 2015-04-26 ENCOUNTER — Ambulatory Visit
Admission: RE | Admit: 2015-04-26 | Discharge: 2015-04-26 | Disposition: A | Discharge status: Home / Self Care | Payer: BC Managed Care – PPO | Source: Ambulatory Visit | Attending: Nurse Practitioner | Admitting: Nurse Practitioner

## 2015-04-26 DIAGNOSIS — C50411 Malignant neoplasm of upper-outer quadrant of right female breast: Secondary | ICD-10-CM

## 2015-04-26 MED ORDER — GADOBENATE DIMEGLUMINE 529 MG/ML IV SOLN
20.0000 mL | Freq: Once | INTRAVENOUS | Status: AC | PRN
  Administered 2015-04-26: 20 mL via INTRAVENOUS

## 2015-05-06 ENCOUNTER — Other Ambulatory Visit: Payer: Self-pay | Admitting: General Surgery

## 2015-05-06 ENCOUNTER — Other Ambulatory Visit: Payer: Self-pay | Admitting: Nurse Practitioner

## 2015-05-06 DIAGNOSIS — C50411 Malignant neoplasm of upper-outer quadrant of right female breast: Secondary | ICD-10-CM

## 2015-05-10 ENCOUNTER — Other Ambulatory Visit: Payer: Self-pay | Admitting: Nurse Practitioner

## 2015-05-10 ENCOUNTER — Encounter: Payer: Self-pay | Admitting: Nurse Practitioner

## 2015-05-10 NOTE — Pre-Procedure Instructions (Addendum)
    [name] [name]  05/10/2015      Your procedure is scheduled on Thursday, May 13, 2015 at 12:05 PM.   Report to [facility] Entrance "A" Admitting Office at 10:00 AM.   Call this number if you have problems the morning of surgery [phone]              Any questions prior to day of surgery, please call [phone] between 8 & 4 PM.    Remember:  Do not eat food or drink liquids after midnight Wednesday, 05/12/15.   Take these medicines the morning of surgery with A SIP OF WATER: Metoprolol (Lopressor), Pantoprazole (Protonix), Tylenol - if needed, Alprazolam (Xanax) - if needed.  Do not use Claritin D prior to surgery.   Do not wear jewelry, make-up or nail polish.  Do not wear lotions, powders, or perfumes.    Do not shave 48 hours prior to surgery.    Do not bring valuables to the hospital.  [facility] is not responsible for any belongings or valuables.  Contacts, dentures or bridgework may not be worn into surgery.  Leave your suitcase in the car.  After surgery it may be brought to your room.  For patients admitted to the hospital, discharge time will be determined by your treatment team.  Special instructions:  See "Preparing for Surgery" Instruction sheet.   Please read over the following fact sheets that you were given. Pain Booklet, Coughing and Deep Breathing and Surgical Site Infection Prevention

## 2015-05-11 ENCOUNTER — Encounter (HOSPITAL_COMMUNITY)
Admission: RE | Admit: 2015-05-11 | Discharge: 2015-05-11 | Disposition: A | Discharge status: Home / Self Care | Payer: BC Managed Care – PPO | Source: Ambulatory Visit | Attending: General Surgery | Admitting: General Surgery

## 2015-05-11 ENCOUNTER — Other Ambulatory Visit: Payer: Self-pay

## 2015-05-11 ENCOUNTER — Encounter (HOSPITAL_COMMUNITY): Payer: Self-pay

## 2015-05-11 DIAGNOSIS — C50911 Malignant neoplasm of unspecified site of right female breast: Secondary | ICD-10-CM | POA: Diagnosis present

## 2015-05-11 DIAGNOSIS — D649 Anemia, unspecified: Secondary | ICD-10-CM | POA: Diagnosis not present

## 2015-05-11 DIAGNOSIS — I10 Essential (primary) hypertension: Secondary | ICD-10-CM | POA: Diagnosis not present

## 2015-05-11 LAB — BASIC METABOLIC PANEL
ANION GAP: 11 (ref 5–15)
BUN: 6 mg/dL (ref 6–20)
CHLORIDE: 104 mmol/L (ref 101–111)
CO2: 25 mmol/L (ref 22–32)
CREATININE: 0.72 mg/dL (ref 0.44–1.00)
Calcium: 9.6 mg/dL (ref 8.9–10.3)
GFR calc non Af Amer: >60 mL/min (ref >60)
Glucose, Bld: 111 mg/dL — ABNORMAL HIGH (ref 65–99)
POTASSIUM: 3.5 mmol/L (ref 3.5–5.1)
SODIUM: 140 mmol/L (ref 135–145)

## 2015-05-11 LAB — CBC WITH DIFFERENTIAL/PLATELET
Basophils Absolute: 0 10*3/uL (ref 0.0–0.1)
Basophils Relative: 0 % (ref 0–1)
EOS PCT: 0 % (ref 0–5)
Eosinophils Absolute: 0 10*3/uL (ref 0.0–0.7)
HEMATOCRIT: 28.8 % — AB (ref 36.0–46.0)
Hemoglobin: 9.8 g/dL — ABNORMAL LOW (ref 12.0–15.0)
LYMPHS PCT: 40 % (ref 12–46)
Lymphs Abs: 1.6 10*3/uL (ref 0.7–4.0)
MCH: 32.9 pg (ref 26.0–34.0)
MCHC: 34 g/dL (ref 30.0–36.0)
MCV: 96.6 fL (ref 78.0–100.0)
MONO ABS: 0.6 10*3/uL (ref 0.1–1.0)
MONOS PCT: 16 % — AB (ref 3–12)
NEUTROS PCT: 44 % (ref 43–77)
Neutro Abs: 1.8 10*3/uL (ref 1.7–7.7)
Platelets: 184 10*3/uL (ref 150–400)
RBC: 2.98 MIL/uL — AB (ref 3.87–5.11)
RDW: 22.8 % — AB (ref 11.5–15.5)
WBC: 4 10*3/uL (ref 4.0–10.5)

## 2015-05-11 NOTE — Progress Notes (Signed)
Anesthesia Chart Review:  Pt is 54 year old female scheduled for R total mastectomy with R axillary sentinel node biopsy, removal of port-a-cath on 05/13/2015 with Dr. Donne Hazel.   PMH includes: HTN, hyperlipidemia, multiple sclerosis, breast cancer. Never smoker. BMI 35.   Medications include: copaxone, hctz, losartan, metoprolol, protonix, potassium.   Preoperative labs reviewed.  H/H 9.8/28.8. Hgb has ranged from 7.9-12.5 since first chemo treatment 11/19/14, most commonly in 10-11 range. Labs marked as reviewed by Dr. Donne Hazel.   Chest x-ray 12/31/2014 reviewed. No edema or consolidation. No mass or adenopathy appreciable.  EKG 05/11/2015: NSR.   Echo 11/05/2014:  - Left ventricle: The cavity size was normal. Wall thickness was normal. Systolic function was normal. The estimated ejection fraction was in the range of 60% to 65%. Wall motion was normal; there were no regional wall motion abnormalities. Doppler parameters are consistent with abnormal left ventricular relaxation (grade 1 diastolic dysfunction). - Atrial septum: There was an atrial septal aneurysm. -Impressions: Normal LV function; grade 1 diastolic dysfunction; global longitudinal strain--24%.  If no changes, I anticipate pt can proceed with surgery as scheduled.   Willeen Cass, FNP-BC [facility]/Anesthesiology Phone: [phone] 05/11/2015 4:45 PM

## 2015-05-12 ENCOUNTER — Other Ambulatory Visit: Payer: Self-pay | Admitting: *Deleted

## 2015-05-12 DIAGNOSIS — C50411 Malignant neoplasm of upper-outer quadrant of right female breast: Secondary | ICD-10-CM

## 2015-05-12 MED ORDER — CEFAZOLIN SODIUM-DEXTROSE 2-3 GM-% IV SOLR
2.0000 g | INTRAVENOUS | Status: AC
  Administered 2015-05-13: 2 g via INTRAVENOUS
  Filled 2015-05-12: qty 50

## 2015-05-13 ENCOUNTER — Ambulatory Visit (HOSPITAL_COMMUNITY): Payer: BC Managed Care – PPO | Admitting: Anesthesiology

## 2015-05-13 ENCOUNTER — Encounter (HOSPITAL_COMMUNITY): Payer: Self-pay | Admitting: *Deleted

## 2015-05-13 ENCOUNTER — Encounter (HOSPITAL_COMMUNITY)
Admission: RE | Admit: 2015-05-13 | Discharge: 2015-05-13 | Disposition: A | Discharge status: Home / Self Care | Payer: BC Managed Care – PPO | Source: Ambulatory Visit | Attending: General Surgery | Admitting: General Surgery

## 2015-05-13 ENCOUNTER — Observation Stay (HOSPITAL_COMMUNITY)
Admission: RE | Admit: 2015-05-13 | Discharge: 2015-05-14 | Disposition: A | Discharge status: Home / Self Care | Payer: BC Managed Care – PPO | Source: Ambulatory Visit | Attending: General Surgery | Admitting: General Surgery

## 2015-05-13 ENCOUNTER — Ambulatory Visit (HOSPITAL_COMMUNITY): Payer: BC Managed Care – PPO | Admitting: Emergency Medicine

## 2015-05-13 ENCOUNTER — Encounter (HOSPITAL_COMMUNITY)
Admission: RE | Disposition: A | Discharge status: Home / Self Care | Payer: Self-pay | Source: Ambulatory Visit | Attending: General Surgery

## 2015-05-13 DIAGNOSIS — D649 Anemia, unspecified: Secondary | ICD-10-CM | POA: Insufficient documentation

## 2015-05-13 DIAGNOSIS — I10 Essential (primary) hypertension: Secondary | ICD-10-CM | POA: Insufficient documentation

## 2015-05-13 DIAGNOSIS — C50911 Malignant neoplasm of unspecified site of right female breast: Secondary | ICD-10-CM | POA: Diagnosis not present

## 2015-05-13 DIAGNOSIS — Z9010 Acquired absence of unspecified breast and nipple: Secondary | ICD-10-CM

## 2015-05-13 DIAGNOSIS — C50411 Malignant neoplasm of upper-outer quadrant of right female breast: Secondary | ICD-10-CM

## 2015-05-13 HISTORY — PX: PORT-A-CATH REMOVAL: SHX5289

## 2015-05-13 HISTORY — PX: SIMPLE MASTECTOMY WITH AXILLARY SENTINEL NODE BIOPSY: SHX6098

## 2015-05-13 SURGERY — SIMPLE MASTECTOMY WITH AXILLARY SENTINEL NODE BIOPSY
Anesthesia: Regional | Site: Chest | Laterality: Right

## 2015-05-13 MED ORDER — LACTATED RINGERS IV SOLN
INTRAVENOUS | Status: DC
  Administered 2015-05-13: 11:00:00 via INTRAVENOUS

## 2015-05-13 MED ORDER — PHENYLEPHRINE HCL 10 MG/ML IJ SOLN
10.0000 mg | INTRAVENOUS | Status: DC | PRN
  Administered 2015-05-13: 10 ug/min via INTRAVENOUS

## 2015-05-13 MED ORDER — LIDOCAINE HCL (CARDIAC) 20 MG/ML IV SOLN
INTRAVENOUS | Status: AC
  Filled 2015-05-13: qty 5

## 2015-05-13 MED ORDER — SODIUM CHLORIDE 0.9 % IV SOLN
INTRAVENOUS | Status: DC
  Administered 2015-05-13: 17:00:00 via INTRAVENOUS

## 2015-05-13 MED ORDER — ACETAMINOPHEN 325 MG PO TABS: 650.0000 mg | ORAL_TABLET | Freq: Four times a day (QID) | ORAL | Status: DC | PRN

## 2015-05-13 MED ORDER — BUPIVACAINE-EPINEPHRINE 0.25% -1:200000 IJ SOLN
INTRAMUSCULAR | Status: DC | PRN
  Administered 2015-05-13: 30 mL

## 2015-05-13 MED ORDER — BUPIVACAINE-EPINEPHRINE (PF) 0.5% -1:200000 IJ SOLN
INTRAMUSCULAR | Status: DC | PRN
  Administered 2015-05-13: 25 mL via PERINEURAL

## 2015-05-13 MED ORDER — ROCURONIUM BROMIDE 50 MG/5ML IV SOLN
INTRAVENOUS | Status: AC
  Filled 2015-05-13: qty 1

## 2015-05-13 MED ORDER — LACTATED RINGERS IV SOLN
INTRAVENOUS | Status: DC | PRN
  Administered 2015-05-13 (×2): via INTRAVENOUS

## 2015-05-13 MED ORDER — FENTANYL CITRATE (PF) 100 MCG/2ML IJ SOLN
INTRAMUSCULAR | Status: DC | PRN
  Administered 2015-05-13 (×2): 50 ug via INTRAVENOUS
  Administered 2015-05-13: 100 ug via INTRAVENOUS
  Administered 2015-05-13: 50 ug via INTRAVENOUS

## 2015-05-13 MED ORDER — ONDANSETRON HCL 4 MG/2ML IJ SOLN: 4.0000 mg | Freq: Four times a day (QID) | INTRAMUSCULAR | Status: DC | PRN

## 2015-05-13 MED ORDER — OXYCODONE HCL 5 MG PO TABS
ORAL_TABLET | ORAL | Status: AC
  Administered 2015-05-13: 5 mg via ORAL
  Filled 2015-05-13: qty 1

## 2015-05-13 MED ORDER — PANTOPRAZOLE SODIUM 40 MG PO TBEC: 40.0000 mg | DELAYED_RELEASE_TABLET | Freq: Every day | ORAL | Status: DC

## 2015-05-13 MED ORDER — MIDAZOLAM HCL 5 MG/5ML IJ SOLN
INTRAMUSCULAR | Status: DC | PRN
  Administered 2015-05-13 (×2): 0.5 mg via INTRAVENOUS

## 2015-05-13 MED ORDER — HYDROCHLOROTHIAZIDE 25 MG PO TABS: 25.0000 mg | ORAL_TABLET | Freq: Every day | ORAL | Status: DC

## 2015-05-13 MED ORDER — OXYCODONE HCL 5 MG PO TABS
5.0000 mg | ORAL_TABLET | ORAL | Status: DC | PRN
  Administered 2015-05-14: 5 mg via ORAL
  Filled 2015-05-13: qty 1

## 2015-05-13 MED ORDER — LOSARTAN POTASSIUM 50 MG PO TABS: 50.0000 mg | ORAL_TABLET | Freq: Two times a day (BID) | ORAL | Status: DC

## 2015-05-13 MED ORDER — METOPROLOL TARTRATE 50 MG PO TABS: 50.0000 mg | ORAL_TABLET | Freq: Two times a day (BID) | ORAL | Status: DC

## 2015-05-13 MED ORDER — MORPHINE SULFATE (PF) 2 MG/ML IV SOLN: 2.0000 mg | INTRAVENOUS | Status: DC | PRN

## 2015-05-13 MED ORDER — ONDANSETRON HCL 4 MG/2ML IJ SOLN
INTRAMUSCULAR | Status: DC | PRN
  Administered 2015-05-13: 4 mg via INTRAVENOUS

## 2015-05-13 MED ORDER — FENTANYL CITRATE (PF) 250 MCG/5ML IJ SOLN
INTRAMUSCULAR | Status: AC
  Filled 2015-05-13: qty 5

## 2015-05-13 MED ORDER — TECHNETIUM TC 99M SULFUR COLLOID FILTERED
1.0000 | Freq: Once | INTRAVENOUS | Status: AC | PRN
  Administered 2015-05-13: 1 via INTRADERMAL

## 2015-05-13 MED ORDER — ALPRAZOLAM 0.5 MG PO TABS: 0.5000 mg | ORAL_TABLET | Freq: Two times a day (BID) | ORAL | Status: DC | PRN

## 2015-05-13 MED ORDER — METHYLENE BLUE 1 % INJ SOLN
INTRAMUSCULAR | Status: AC
  Filled 2015-05-13: qty 10

## 2015-05-13 MED ORDER — HYDROMORPHONE HCL 1 MG/ML IJ SOLN: 0.2500 mg | INTRAMUSCULAR | Status: DC | PRN

## 2015-05-13 MED ORDER — PROPOFOL 10 MG/ML IV BOLUS
INTRAVENOUS | Status: AC
  Filled 2015-05-13: qty 20

## 2015-05-13 MED ORDER — ONDANSETRON HCL 4 MG/2ML IJ SOLN
INTRAMUSCULAR | Status: AC
  Filled 2015-05-13: qty 2

## 2015-05-13 MED ORDER — PROPOFOL 10 MG/ML IV BOLUS
INTRAVENOUS | Status: DC | PRN
  Administered 2015-05-13: 180 mg via INTRAVENOUS

## 2015-05-13 MED ORDER — MIDAZOLAM HCL 2 MG/2ML IJ SOLN
INTRAMUSCULAR | Status: AC
  Administered 2015-05-13: 1 mg via INTRAVENOUS
  Filled 2015-05-13: qty 2

## 2015-05-13 MED ORDER — BUPIVACAINE-EPINEPHRINE (PF) 0.25% -1:200000 IJ SOLN
INTRAMUSCULAR | Status: AC
  Filled 2015-05-13: qty 30

## 2015-05-13 MED ORDER — ACETAMINOPHEN 650 MG RE SUPP: 650.0000 mg | Freq: Four times a day (QID) | RECTAL | Status: DC | PRN

## 2015-05-13 MED ORDER — OXYCODONE HCL 5 MG PO TABS
5.0000 mg | ORAL_TABLET | Freq: Once | ORAL | Status: AC | PRN
  Administered 2015-05-13: 5 mg via ORAL

## 2015-05-13 MED ORDER — FENTANYL CITRATE (PF) 100 MCG/2ML IJ SOLN
INTRAMUSCULAR | Status: AC
  Administered 2015-05-13: 50 ug via INTRAVENOUS
  Filled 2015-05-13: qty 2

## 2015-05-13 MED ORDER — MIDAZOLAM HCL 2 MG/2ML IJ SOLN
INTRAMUSCULAR | Status: AC
  Filled 2015-05-13: qty 4

## 2015-05-13 MED ORDER — POTASSIUM CHLORIDE CRYS ER 10 MEQ PO TBCR
10.0000 meq | EXTENDED_RELEASE_TABLET | Freq: Two times a day (BID) | ORAL | Status: DC
  Administered 2015-05-13: 10 meq via ORAL
  Filled 2015-05-13: qty 1

## 2015-05-13 MED ORDER — LIDOCAINE HCL (CARDIAC) 20 MG/ML IV SOLN
INTRAVENOUS | Status: DC | PRN
  Administered 2015-05-13: 60 mg via INTRAVENOUS

## 2015-05-13 MED ORDER — ONDANSETRON 4 MG PO TBDP: 4.0000 mg | ORAL_TABLET | Freq: Four times a day (QID) | ORAL | Status: DC | PRN

## 2015-05-13 MED ORDER — METHOCARBAMOL 500 MG PO TABS
500.0000 mg | ORAL_TABLET | Freq: Three times a day (TID) | ORAL | Status: DC | PRN
  Filled 2015-05-13: qty 1

## 2015-05-13 MED ORDER — SODIUM CHLORIDE 0.9 % IJ SOLN
INTRAMUSCULAR | Status: DC | PRN
  Administered 2015-05-13: 5 mL via INTRAMUSCULAR

## 2015-05-13 MED ORDER — MIDAZOLAM HCL 2 MG/2ML IJ SOLN
1.0000 mg | INTRAMUSCULAR | Status: DC | PRN
  Administered 2015-05-13: 1 mg via INTRAVENOUS

## 2015-05-13 MED ORDER — BUPIVACAINE HCL (PF) 0.25 % IJ SOLN
INTRAMUSCULAR | Status: AC
  Filled 2015-05-13: qty 10

## 2015-05-13 MED ORDER — FENTANYL CITRATE (PF) 100 MCG/2ML IJ SOLN
50.0000 ug | INTRAMUSCULAR | Status: DC | PRN
  Administered 2015-05-13: 50 ug via INTRAVENOUS

## 2015-05-13 MED ORDER — OXYCODONE HCL 5 MG/5ML PO SOLN: 5.0000 mg | Freq: Once | ORAL | Status: AC | PRN

## 2015-05-13 SURGICAL SUPPLY — 63 items
APPLIER CLIP 9.375 MED OPEN (MISCELLANEOUS) ×4
APR CLP MED 9.3 20 MLT OPN (MISCELLANEOUS) ×2
BINDER BREAST LRG (GAUZE/BANDAGES/DRESSINGS) IMPLANT
BINDER BREAST XLRG (GAUZE/BANDAGES/DRESSINGS) ×2 IMPLANT
BIOPATCH RED 1 DISK 7.0 (GAUZE/BANDAGES/DRESSINGS) ×3 IMPLANT
BIOPATCH RED 1IN DISK 7.0MM (GAUZE/BANDAGES/DRESSINGS) ×1
CANISTER SUCTION 2500CC (MISCELLANEOUS) ×4 IMPLANT
CHLORAPREP W/TINT 10.5 ML (MISCELLANEOUS) ×4 IMPLANT
CHLORAPREP W/TINT 26ML (MISCELLANEOUS) ×4 IMPLANT
CLIP APPLIE 9.375 MED OPEN (MISCELLANEOUS) ×2 IMPLANT
CLOSURE WOUND 1/2 X4 (GAUZE/BANDAGES/DRESSINGS) ×2
CONT SPEC 4OZ CLIKSEAL STRL BL (MISCELLANEOUS) ×4 IMPLANT
COVER PROBE W GEL 5X96 (DRAPES) ×4 IMPLANT
COVER SURGICAL LIGHT HANDLE (MISCELLANEOUS) ×4 IMPLANT
DECANTER SPIKE VIAL GLASS SM (MISCELLANEOUS) ×1 IMPLANT
DRAIN CHANNEL 19F RND (DRAIN) ×6 IMPLANT
DRAPE CHEST BREAST 15X10 FENES (DRAPES) ×4 IMPLANT
DRAPE PED LAPAROTOMY (DRAPES) ×4 IMPLANT
DRSG TEGADERM 2-3/8X2-3/4 SM (GAUZE/BANDAGES/DRESSINGS) ×4 IMPLANT
ELECT BLADE 4.0 EZ CLEAN MEGAD (MISCELLANEOUS) ×4
ELECT CAUTERY BLADE 6.4 (BLADE) ×4 IMPLANT
ELECT REM PT RETURN 9FT ADLT (ELECTROSURGICAL) ×4
ELECTRODE BLDE 4.0 EZ CLN MEGD (MISCELLANEOUS) ×2 IMPLANT
ELECTRODE REM PT RTRN 9FT ADLT (ELECTROSURGICAL) ×2 IMPLANT
EVACUATOR SILICONE 100CC (DRAIN) ×6 IMPLANT
GAUZE SPONGE 4X4 16PLY XRAY LF (GAUZE/BANDAGES/DRESSINGS) ×4 IMPLANT
GLOVE BIO SURGEON STRL SZ7 (GLOVE) ×6 IMPLANT
GLOVE BIOGEL PI IND STRL 7.0 (GLOVE) IMPLANT
GLOVE BIOGEL PI IND STRL 7.5 (GLOVE) ×2 IMPLANT
GLOVE BIOGEL PI IND STRL 8 (GLOVE) IMPLANT
GLOVE BIOGEL PI INDICATOR 7.0 (GLOVE) ×2
GLOVE BIOGEL PI INDICATOR 7.5 (GLOVE) ×2
GLOVE BIOGEL PI INDICATOR 8 (GLOVE) ×2
GOWN STRL REUS W/ TWL LRG LVL3 (GOWN DISPOSABLE) ×4 IMPLANT
GOWN STRL REUS W/TWL LRG LVL3 (GOWN DISPOSABLE) ×8
KIT BASIN OR (CUSTOM PROCEDURE TRAY) ×4 IMPLANT
KIT ROOM TURNOVER OR (KITS) ×4 IMPLANT
LIQUID BAND (GAUZE/BANDAGES/DRESSINGS) ×4 IMPLANT
MARKER SKIN DUAL TIP RULER LAB (MISCELLANEOUS) ×2 IMPLANT
NDL HYPO 25GX1X1/2 BEV (NEEDLE) ×2 IMPLANT
NEEDLE 18GX1X1/2 (RX/OR ONLY) (NEEDLE) ×4 IMPLANT
NEEDLE HYPO 25GX1X1/2 BEV (NEEDLE) ×4 IMPLANT
NS IRRIG 1000ML POUR BTL (IV SOLUTION) ×4 IMPLANT
PACK GENERAL/GYN (CUSTOM PROCEDURE TRAY) ×4 IMPLANT
PACK SURGICAL SETUP 50X90 (CUSTOM PROCEDURE TRAY) ×4 IMPLANT
PAD ARMBOARD 7.5X6 YLW CONV (MISCELLANEOUS) ×6 IMPLANT
PENCIL BUTTON HOLSTER BLD 10FT (ELECTRODE) ×4 IMPLANT
PIN SAFETY STERILE (MISCELLANEOUS) ×1 IMPLANT
SPECIMEN JAR X LARGE (MISCELLANEOUS) ×4 IMPLANT
SPONGE LAP 18X18 X RAY DECT (DISPOSABLE) ×2 IMPLANT
STAPLER VISISTAT 35W (STAPLE) ×4 IMPLANT
STRIP CLOSURE SKIN 1/2X4 (GAUZE/BANDAGES/DRESSINGS) ×2 IMPLANT
SUT ETHILON 2 0 FS 18 (SUTURE) ×6 IMPLANT
SUT MNCRL AB 3-0 PS2 18 (SUTURE) ×4 IMPLANT
SUT MNCRL AB 4-0 PS2 18 (SUTURE) ×4 IMPLANT
SUT SILK 2 0 FS (SUTURE) ×4 IMPLANT
SUT VIC AB 3-0 SH 27 (SUTURE) ×4
SUT VIC AB 3-0 SH 27X BRD (SUTURE) ×2 IMPLANT
SUT VIC AB 3-0 SH 8-18 (SUTURE) ×6 IMPLANT
SYR CONTROL 10ML LL (SYRINGE) ×6 IMPLANT
TOWEL OR 17X24 6PK STRL BLUE (TOWEL DISPOSABLE) ×4 IMPLANT
TOWEL OR 17X26 10 PK STRL BLUE (TOWEL DISPOSABLE) ×4 IMPLANT
WATER STERILE IRR 1000ML POUR (IV SOLUTION) IMPLANT

## 2015-05-13 NOTE — Anesthesia Procedure Notes (Addendum)
Anesthesia Regional Block:  Pectoralis block  Pre-Anesthetic Checklist: ,, timeout performed, Correct Patient, Correct Site, Correct Laterality, Correct Procedure, Correct Position, site marked, Risks and benefits discussed,  Surgical consent,  Pre-op evaluation,  At surgeon's request and post-op pain management  Laterality: Right  Prep: chloraprep       Needles:  Injection technique: Single-shot  Needle Type: Echogenic Needle     Needle Length: 9cm 9 cm Needle Gauge: 21 and 21 G    Additional Needles:  Procedures: ultrasound guided (picture in chart) Pectoralis block Narrative:  Start time: 05/13/2015 11:17 AM End time: 05/13/2015 11:27 AM Injection made incrementally with aspirations every 5 mL.  Performed by: Personally  Anesthesiologist: HODIERNE, ADAM  Additional Notes: Pt tolerated the procedure well.   Procedure Name: LMA Insertion Date/Time: 05/13/2015 1:11 PM Performed by: Izora Gala Pre-anesthesia Checklist: Patient identified, Emergency Drugs available, Suction available, Patient being monitored and Timeout performed Patient Re-evaluated:Patient Re-evaluated prior to inductionOxygen Delivery Method: Circle system utilized Preoxygenation: Pre-oxygenation with 100% oxygen Intubation Type: IV induction Ventilation: Mask ventilation without difficulty LMA: LMA inserted LMA Size: 4.0 Number of attempts: 1 Placement Confirmation: positive ETCO2 Tube secured with: Tape Dental Injury: Teeth and Oropharynx as per pre-operative assessment

## 2015-05-13 NOTE — Anesthesia Preprocedure Evaluation (Signed)
Anesthesia Evaluation  Patient identified by MRN, date of birth, ID band Patient awake    Reviewed: Allergy & Precautions, NPO status , Patient's Chart, lab work & pertinent test results  Airway Mallampati: II   Neck ROM: full    Dental   Pulmonary neg pulmonary ROS,  breath sounds clear to auscultation        Cardiovascular hypertension, Rhythm:regular Rate:Normal     Neuro/Psych  Headaches, Anxiety    GI/Hepatic GERD-  ,  Endo/Other  obese  Renal/GU      Musculoskeletal   Abdominal   Peds  Hematology   Anesthesia Other Findings   Reproductive/Obstetrics Breast CA.                             Anesthesia Physical Anesthesia Plan  ASA: II  Anesthesia Plan: General and Regional   Post-op Pain Management: MAC Combined w/ Regional for Post-op pain   Induction: Intravenous  Airway Management Planned: LMA  Additional Equipment:   Intra-op Plan:   Post-operative Plan:   Informed Consent: I have reviewed the patients History and Physical, chart, labs and discussed the procedure including the risks, benefits and alternatives for the proposed anesthesia with the patient or authorized representative who has indicated his/her understanding and acceptance.     Plan Discussed with: CRNA, Anesthesiologist and Surgeon  Anesthesia Plan Comments:         Anesthesia Quick Evaluation

## 2015-05-13 NOTE — Interval H&P Note (Signed)
History and Physical Interval Note:  05/13/2015 12:14 PM  [name] [name]  has presented today for surgery, with the diagnosis of right breast cancer  The various methods of treatment have been discussed with the patient and family. After consideration of risks, benefits and other options for treatment, the patient has consented to  Procedure(s): RIGHT TOTAL  MASTECTOMY WITH RIGHT  AXILLARY SENTINEL NODE BIOPSY (Right) REMOVAL PORT-A-CATH (N/A) as a surgical intervention .  The patient's history has been reviewed, patient examined, no change in status, stable for surgery.  I have reviewed the patient's chart and labs.  Questions were answered to the patient's satisfaction.     Narmeen Kerper

## 2015-05-13 NOTE — Anesthesia Postprocedure Evaluation (Signed)
  Anesthesia Post-op Note  Patient: [name] [name]  Procedure(s) Performed: Procedure(s): RIGHT TOTAL  MASTECTOMY WITH RIGHT  AXILLARY SENTINEL NODE BIOPSY (Right) REMOVAL PORT-A-CATH (Left)  Patient Location: PACU  Anesthesia Type:General and block  Level of Consciousness: awake and alert   Airway and Oxygen Therapy: Patient Spontanous Breathing  Post-op Pain: Controlled  Post-op Assessment: Post-op Vital signs reviewed, Patient's Cardiovascular Status Stable and Respiratory Function Stable  Post-op Vital Signs: Reviewed  Filed Vitals:   05/13/15 1715  BP: 133/73  Pulse: 99  Temp: 36.8 C  Resp: 18    Complications: No apparent anesthesia complications

## 2015-05-13 NOTE — H&P (Signed)
48 yof who presented in 2/16 with a palpable right breast mass. density is C. US showed a 2.9x1.5x1.6 cm mass with 11 mm node. MRI showed a nl left breast, nl left axillary nodes, nl im nodes and a 2.5x2.2x2.1 cm right breast mass. there was another mass near this and an additional 6.5x4.8 cm of NME. the extent of this was biopsied and was dcis. nodes have been biopsied and are negative. biopsy of initial mass showed a grade III idc/dcis with lvi that is TN and Ki is 71%. she has completed primary chemotherapy and has tolerated that pretty well. repeat mri showes no abnl nodes, nl left breast and no residual enhancement in the right breast. She is here today to discuss options.   Other Problems [name], MD; 05/06/2015 1:37 PM) Breast Cancer Gastroesophageal Reflux Disease High blood pressure Migraine Headache Other disease, cancer, significant illness  Past Surgical History [name], MD; 05/06/2015 1:37 PM) Breast Biopsy Right. Colon Polyp Removal - Colonoscopy Oral Surgery  Diagnostic Studies History [name], MD; 05/06/2015 1:37 PM) Colonoscopy 1-5 years ago Mammogram within last year Pap Smear 1-5 years ago  Allergies Elbert Ewings, CMA; 05/04/2015 2:19 PM) Clarithromycin *CHEMICALS* Levaquin *Fluoroquinolones**  Medication History Elbert Ewings, CMA; 05/04/2015 2:20 PM) ALPRAZolam (0.5MG  Tablet, Oral) Active. Losartan Potassium (50MG  Tablet, Oral) Active. Potassium Chloride ER (10MEQ Capsule ER, Oral) Active. Pantoprazole Sodium (40MG  Tablet DR, Oral) Active. Hydrochlorothiazide (25MG  Tablet, Oral) Active. Copaxone (40MG /ML Soln Pref Syr, Subcutaneous) Active. Metoprolol Tartrate (50MG  Tablet, Oral) Active. Lidocaine-Prilocaine (2.5-2.5% Cream, External) Active. Medications Reconciled  Social History [name], MD; 05/06/2015 1:37 PM) Caffeine use Carbonated beverages. No alcohol use No drug use Tobacco  use Never smoker.  Family History [name], MD; 05/06/2015 1:37 PM) Alcohol Abuse Brother. Arthritis Mother. Cerebrovascular Accident Mother. Depression Brother. Diabetes Mellitus Mother. Heart Disease Mother. Hypertension Brother, Mother. Seizure disorder Brother.  Vitals Elbert Ewings CMA; 05/04/2015 2:20 PM) 05/04/2015 2:20 PM Weight: 213 lb Height: 65in Body Surface Area: 2.1 m Body Mass Index: 35.44 kg/m Temp.: 97.57F(Oral)  Pulse: 100 (Regular)  Resp.: 17 (Unlabored)  BP: 142/78 (Sitting, Left Arm, Standard)    Physical Exam [name] MD; 05/06/2015 1:38 PM) General Mental Status-Alert. Orientation-Oriented X3.  Chest and Lung Exam Chest and lung exam reveals -on auscultation, normal breath sounds, no adventitious sounds and normal vocal resonance.  Breast Nipples-No Discharge. Breast Lump-No Palpable Breast Mass.  Cardiovascular Cardiovascular examination reveals -normal heart sounds, regular rate and rhythm with no murmurs.  Lymphatic Head & Neck  General Head & Neck Lymphatics: Bilateral - Description - Normal. Axillary  General Axillary Region: Bilateral - Description - Normal.    Assessment & Plan [name] MD; 05/06/2015 1:41 PM) BREAST CANCER, RIGHT (174.9  C50.911) Story: right total mastectomy with right axillary sn biopsy, port removal we discussed great radiologic response to chemotherapy. we discussed need for surgery as well. will plan on right axillary sn biopsy with dual tracer and await permanent results to discuss options. she understands both radiation and further surgery are possibilities. we discussed a bracketed lumpectomy combined with radiation would be possible as well. she is adamant about undergoing mastectomy and I think this is reasonable. she has declined an appt with plastic surgery as she does not want to consider reconstruction at this time. I encouraged her to at least  speak with them but she does not want to. we discussed risks, recovery and outcome of mastectomy. will plan on scheduling at end of month to  put her a few weeks from completion of chemotherapy

## 2015-05-13 NOTE — Transfer of Care (Signed)
Immediate Anesthesia Transfer of Care Note  Patient: [name] [name]  Procedure(s) Performed: Procedure(s): RIGHT TOTAL  MASTECTOMY WITH RIGHT  AXILLARY SENTINEL NODE BIOPSY (Right) REMOVAL PORT-A-CATH (Left)  Patient Location: PACU  Anesthesia Type:General  Level of Consciousness: awake, alert , oriented and patient cooperative  Airway & Oxygen Therapy: Patient Spontanous Breathing  Post-op Assessment: Report given to RN, Post -op Vital signs reviewed and stable, Patient moving all extremities and Patient moving all extremities X 4  Post vital signs: Reviewed and stable  Last Vitals:  Filed Vitals:   05/13/15 1145  BP: 160/77  Pulse: 116  Temp:   Resp: 16    Complications: No apparent anesthesia complications

## 2015-05-13 NOTE — Op Note (Signed)
Preoperative diagnosis: Right breast cancer status post primary chemotherapy Postoperative diagnosis: Same as above Procedure: #1Right total mastectomy #2 right axillary sentinel lymph node biopsy #3 port removal #4 injection of blue dye for sentinel node identification Surgeon: Dr Serita Grammes Anesthesia: Gen. With a pectoral block Estimated blood loss: 100 mL Specimens: #1 right mastectomy short stitch superior, long stitch lateral #2 right axillary sentinel node times three Complications none Drains 2 19 Fr blake drains to mastectomy space Sponge and needle count correct Dispo to pacu stable  Indications: This is a 49 yof who has undergone primary chemotherapy with great MRI response.  We have discussed all options and she would like to proceed with mastectomy, sn biopsy.  She has declined any discussion about reconstruction. Will also remove her port.  Procedure: After informed consent was obtained the patient was taken to the operating room. She had first been injected with technetium in the standard periareolar fashion. She underwent a pectoral block. She then was placed under general anesthesia without complication.  She was prepped and draped in the standard sterile surgical fashion. A surgical timeout was then performed.  I first removed her port. I infiltrated quarter percent Marcaine. I then reentered her old incision. The port and the line were removed in their entirety. Hemostasis was observed. I closed this with 3-0 Vicryl, 4-0 Monocryl, and glue.  I then performed the right mastectomy. I made a lengthy incision which encompassed a lot of skin due to the size of her breast and no reconstruction. I then created flaps of the parasternal region, clavicle, inframammary crease, and the latissimus laterally. I then removed the breast including the pectoralis fashion from the muscle. This was then passed off the table as a specimen. I then was able to locate 3 sentinel nodes. 2 of  these were blue. The remaining counts in the axilla were negligible. There were no other blue nodes. I then obtained hemostasis. I placed 2 19 [place] Blake drains and secured these with 2-0 nylon suture. I then closed the incision with 3-0 Vicryl. She had a lot of excess skin laterally so I did remove some more and closed this in a Y fashion. I then used a 4-0 Monocryl to close the skin. Glue and Steri-Strips were applied. She tolerated this well was extubated and transferred to the recovery room in stable condition.

## 2015-05-14 ENCOUNTER — Encounter (HOSPITAL_COMMUNITY): Payer: Self-pay | Admitting: General Surgery

## 2015-05-14 DIAGNOSIS — C50911 Malignant neoplasm of unspecified site of right female breast: Secondary | ICD-10-CM | POA: Diagnosis not present

## 2015-05-14 LAB — CBC
HCT: 23.2 % — ABNORMAL LOW (ref 36.0–46.0)
Hemoglobin: 7.8 g/dL — ABNORMAL LOW (ref 12.0–15.0)
MCH: 32.6 pg (ref 26.0–34.0)
MCHC: 33.6 g/dL (ref 30.0–36.0)
MCV: 97.1 fL (ref 78.0–100.0)
PLATELETS: 180 10*3/uL (ref 150–400)
RBC: 2.39 MIL/uL — AB (ref 3.87–5.11)
RDW: 23.2 % — ABNORMAL HIGH (ref 11.5–15.5)
WBC: 4.1 10*3/uL (ref 4.0–10.5)

## 2015-05-14 LAB — BASIC METABOLIC PANEL
ANION GAP: 8 (ref 5–15)
BUN: 6 mg/dL (ref 6–20)
CO2: 26 mmol/L (ref 22–32)
Calcium: 8.7 mg/dL — ABNORMAL LOW (ref 8.9–10.3)
Chloride: 107 mmol/L (ref 101–111)
Creatinine, Ser: 0.62 mg/dL (ref 0.44–1.00)
GFR calc Af Amer: >60 mL/min (ref >60)
Glucose, Bld: 100 mg/dL — ABNORMAL HIGH (ref 65–99)
POTASSIUM: 3.4 mmol/L — AB (ref 3.5–5.1)
SODIUM: 141 mmol/L (ref 135–145)

## 2015-05-14 MED ORDER — MENTHOL 3 MG MT LOZG
1.0000 | LOZENGE | OROMUCOSAL | Status: DC | PRN
  Filled 2015-05-14: qty 9

## 2015-05-14 MED ORDER — OXYCODONE HCL 5 MG PO TABS: 5.0000 mg | ORAL_TABLET | Freq: Four times a day (QID) | ORAL | Status: DC | PRN

## 2015-05-14 MED ORDER — FERROUS GLUCONATE 324 (38 FE) MG PO TABS: 324.0000 mg | ORAL_TABLET | Freq: Two times a day (BID) | ORAL | Status: DC

## 2015-05-14 NOTE — Discharge Summary (Signed)
Physician Discharge Summary  Patient ID: [name] [name] MRN: [id number] DOB/AGE: 1961-03-25 54 y.o.  Admit date: 05/13/2015 Discharge date: 05/14/2015  Admission Diagnoses: Right breast cancer s/p primary chemotherapy Anemia  Discharge Diagnoses:  Active Problems:   S/P mastectomy   Discharged Condition: good  Hospital Course: 37 yof admitted after undergoing primary chemotherapy for right total mastectomy, sn biopsy and port removal. She did well.  Her anemia is worsened with surgery and volume but she is asymptomatic and has no hematoma with thin drain output.  She is tolerating diet, ambulating with good pain control.    Consults: None  Significant Diagnostic Studies: none  Treatments: surgery: see above  Discharge Exam: Blood pressure 112/71, pulse 112, temperature 98.6 F (37 C), temperature source Oral, resp. rate 18, height 5\' 5"  (1.651 m), weight 95.8 kg (211 lb 3.2 oz), last menstrual period 11/01/2012, SpO2 100 %. Incision/Wound:wound clean without infection/drainage, drains mostly serous, no hematoma   Disposition: 01-Home or Self Care     Medication List    TAKE these medications        acetaminophen 500 MG tablet  Commonly known as:  TYLENOL  Take 1,000 mg by mouth 2 (two) times daily as needed (pain).     ALPRAZolam 0.5 MG tablet  Commonly known as:  XANAX  Take 1 tablet (0.5 mg total) by mouth 3 (three) times daily as needed for anxiety or sleep.     b complex vitamins tablet  Take 1 tablet by mouth daily.     ferrous gluconate 324 MG tablet  Commonly known as:  FERGON  Take 1 tablet (324 mg total) by mouth 2 (two) times daily with a meal.     Glatiramer Acetate 40 MG/ML Sosy  Commonly known as:  COPAXONE  Inject 40 mg into the skin 3 (three) times a week.     hydrochlorothiazide 25 MG tablet  Commonly known as:  HYDRODIURIL  TAKE 1 TABLET DAILY     loratadine-pseudoephedrine 5-120 MG per tablet  Commonly known as:  CLARITIN-D 12 HOUR   Take 1 tablet by mouth as needed for allergies.     losartan 50 MG tablet  Commonly known as:  COZAAR  Take 1 tablet (50 mg total) by mouth 2 (two) times daily.     metoprolol 50 MG tablet  Commonly known as:  LOPRESSOR  TAKE 1 TABLET TWICE A DAY     oxyCODONE 5 MG immediate release tablet  Commonly known as:  Oxy IR/ROXICODONE  Take 1-2 tablets (5-10 mg total) by mouth every 6 (six) hours as needed for moderate pain, severe pain or breakthrough pain.     pantoprazole 40 MG tablet  Commonly known as:  PROTONIX  Take 1 tablet (40 mg total) by mouth daily.     potassium chloride 10 MEQ CR capsule  Commonly known as:  MICRO-K  Take 1 capsule (10 mEq total) by mouth 2 (two) times daily. Pt taking 2 - 10 mEq tablets 2 times a day. Total dose 40 mEq daily.           Follow-up Information    Follow up with [facility], [place] In 1 week.   Specialty:  General Surgery   Contact information:   [address] [place] [address] [phone]       Signed: [name] 05/14/2015, 8:30 AM

## 2015-05-14 NOTE — Progress Notes (Signed)
DC home with daughter, understands on how to care forJPs, supplies and teaching sheet sent home with pt., verbally understands DC instructions, no questions ask.

## 2015-05-14 NOTE — Discharge Instructions (Signed)
[facility] °[phone] ° °MASTECTOMY: POST OP INSTRUCTIONS ° °Always review your discharge instruction sheet given to you by the facility where your surgery was performed. °IF YOU HAVE DISABILITY OR FAMILY LEAVE FORMS, YOU MUST BRING THEM TO THE OFFICE FOR PROCESSING.   °DO NOT GIVE THEM TO YOUR DOCTOR. °A prescription for pain medication may be given to you upon discharge.  Take your pain medication as prescribed, if needed.  If narcotic pain medicine is not needed, then you may take acetaminophen (Tylenol), naprosyn (Alleve) or ibuprofen (Advil) as needed. °1. Take your usually prescribed medications unless otherwise directed. °2. If you need a refill on your pain medication, please contact your pharmacy.  They will contact our office to request authorization.  Prescriptions will not be filled after 5pm or on week-ends. °3. You should follow a light diet the first few days after arrival home, such as soup and crackers, etc.  Resume your normal diet the day after surgery. °4. Most patients will experience some swelling and bruising on the chest and underarm.  Ice packs will help.  Swelling and bruising can take several days to resolve. Wear the binder day and night until you return to the office.  °5. It is common to experience some constipation if taking pain medication after surgery.  Increasing fluid intake and taking a stool softener (such as Colace) will usually help or prevent this problem from occurring.  A mild laxative (Milk of Magnesia or Miralax) should be taken according to package instructions if there are no bowel movements after 48 hours. °6. Unless discharge instructions indicate otherwise, leave your bandage dry and in place until your next appointment in 3-5 days.  You may take a limited sponge bath.  No tube baths or showers until the drains are removed.  You may have steri-strips (small skin tapes) in place directly over the incision.  These strips should be left on the  skin for 7-10 days. If you have glue it will come off in next couple week.  Any sutures will be removed at an office visit °7. DRAINS:  If you have drains in place, it is important to keep a list of the amount of drainage produced each day in your drains.  Before leaving the hospital, you should be instructed on drain care.  Call our office if you have any questions about your drains. I will remove your drains when they put out less than 30 cc or ml for 2 consecutive days. °8. ACTIVITIES:  You may resume regular (light) daily activities beginning the next day--such as daily self-care, walking, climbing stairs--gradually increasing activities as tolerated.  You may have sexual intercourse when it is comfortable.  Refrain from any heavy lifting or straining until approved by your doctor. °a. You may drive when you are no longer taking prescription pain medication, you can comfortably wear a seatbelt, and you can safely maneuver your car and apply brakes. °b. RETURN TO WORK:  __________________________________________________________ °9. You should see your doctor in the office for a follow-up appointment approximately 3-5 days after your surgery.  Your doctor’s nurse will typically make your follow-up appointment when she calls you with your pathology report.  Expect your pathology report 3-4business days after surgery. °10. OTHER INSTRUCTIONS: ______________________________________________________________________________________________ ____________________________________________________________________________________________ °WHEN TO CALL YOUR DR Yasha Tibbett: °1. Fever over 101.0 °2. Nausea and/or vomiting °3. Extreme swelling or bruising °4. Continued bleeding from incision. °5. Increased pain, redness, or drainage from the incision. °The clinic staff is available   to answer your questions during regular business hours.  Please don’t hesitate to call and ask to speak to one of the nurses for clinical concerns.  If  you have a medical emergency, go to the nearest emergency room or call 911.  A surgeon from [facility] is always on call at the hospital. °[address], [address], [place], [place]  [address] ? [address] [address], [place], [place]   [address] °[phone] ? [phone] ? FAX [phone] °Web site: [email or web address] ° °

## 2015-05-19 ENCOUNTER — Other Ambulatory Visit: Payer: Self-pay | Admitting: Nurse Practitioner

## 2015-05-20 ENCOUNTER — Encounter: Payer: Self-pay | Admitting: Oncology

## 2015-05-20 ENCOUNTER — Other Ambulatory Visit: Payer: Self-pay | Admitting: *Deleted

## 2015-05-20 MED ORDER — POTASSIUM CHLORIDE ER 10 MEQ PO CPCR: 10.0000 meq | ORAL_CAPSULE | Freq: Two times a day (BID) | ORAL | Status: DC

## 2015-05-20 NOTE — Progress Notes (Signed)
I faxed notes/labs to disabilty rms [phone]

## 2015-05-25 ENCOUNTER — Other Ambulatory Visit: Payer: Self-pay

## 2015-05-25 DIAGNOSIS — I82409 Acute embolism and thrombosis of unspecified deep veins of unspecified lower extremity: Secondary | ICD-10-CM

## 2015-05-26 ENCOUNTER — Other Ambulatory Visit: Payer: Self-pay | Admitting: *Deleted

## 2015-05-26 ENCOUNTER — Encounter: Payer: Self-pay | Admitting: Internal Medicine

## 2015-05-26 ENCOUNTER — Other Ambulatory Visit: Payer: Self-pay | Admitting: Internal Medicine

## 2015-05-26 MED ORDER — POTASSIUM CHLORIDE ER 10 MEQ PO CPCR: 10.0000 meq | ORAL_CAPSULE | Freq: Two times a day (BID) | ORAL | Status: DC

## 2015-05-27 ENCOUNTER — Other Ambulatory Visit: Payer: Self-pay

## 2015-05-27 DIAGNOSIS — M79606 Pain in leg, unspecified: Secondary | ICD-10-CM

## 2015-05-27 DIAGNOSIS — M25562 Pain in left knee: Secondary | ICD-10-CM

## 2015-05-31 ENCOUNTER — Ambulatory Visit
Admission: RE | Admit: 2015-05-31 | Discharge: 2015-05-31 | Disposition: A | Discharge status: Home / Self Care | Payer: BC Managed Care – PPO | Source: Ambulatory Visit | Attending: General Surgery | Admitting: General Surgery

## 2015-06-08 ENCOUNTER — Encounter: Payer: Self-pay | Admitting: Radiation Oncology

## 2015-06-08 NOTE — Progress Notes (Signed)
Per tumor board discussion, the described subpectoral LN on the initial MRI is likely benign in light of all the other findings (some SP nodes are present on the other side, (-) ax node biopsy). Radiology has a very low suspicion for this node.  -----------------------------------  [name], MD

## 2015-06-09 ENCOUNTER — Ambulatory Visit: Payer: BC Managed Care – PPO | Attending: General Surgery | Admitting: Physical Therapy

## 2015-06-09 DIAGNOSIS — R293 Abnormal posture: Secondary | ICD-10-CM | POA: Insufficient documentation

## 2015-06-09 NOTE — Therapy (Signed)
[place], [place], [address] Phone: [phone]   Fax:  [phone]  Physical Therapy Evaluation  Patient Details  Name: [name] [name] MRN: [id number] Date of Birth: [birth year] Referring Provider:  [name], MD  Encounter Date: 06/09/2015    Past Medical History  Diagnosis Date  . OBESITY, TRUNCAL   . CONSTIPATION, CHRONIC   . Multiple sclerosis   . Allergic rhinitis due to other allergen   . HYPERTENSION   . HYPERLIPIDEMIA   . GERD   . Angioedema     ACEI  . Colon polyps 05/2014    adenomatous, colo q 5y  . Migraine headache   . Allergy   . Anxiety   . Breast cancer of upper-outer quadrant of right female breast 10/23/2014  . Hot flashes     Past Surgical History  Procedure Laterality Date  . Tubal ligation    . Colonoscopy    . Portacath placement Left 11/06/2014    Procedure: INSERTION PORT-A-CATH;  Surgeon: [name], MD;  Location: [facility];  Service: General;  Laterality: Left;  . Simple mastectomy with axillary sentinel node biopsy Right 05/13/2015    Procedure: RIGHT TOTAL  MASTECTOMY WITH RIGHT  AXILLARY SENTINEL NODE BIOPSY;  Surgeon: [name], MD;  Location: [facility];  Service: General;  Laterality: Right;  . Port-a-cath removal Left 05/13/2015    Procedure: REMOVAL PORT-A-CATH;  Surgeon: [name], MD;  Location: [facility];  Service: General;  Laterality: Left;    There were no vitals filed for this visit.  Visit Diagnosis:  Abnormal posture - Plan: PT PLAN OF CARE CERT/RE-CERT      Subjective Assessment - 06/09/15 1613    Subjective "I  feel fine"   Pertinent History Patient was diagnosed on 10/21/14 with right Triple negative breast cancer with a Ki67 of 71%.  She has 2 masses found on MRI, one measuring 2.5 cm and the other measuring 1.4 cm. Chemotherapy completed 04/21/2015, right mastectomy with 3 nodes removed on 05/13/2015, does  not need radiation, no more surgery planned    Patient Stated Goals normal range movement of my arm    Currently in Pain? No/denies            [facility] PT Assessment - 06/09/15 0001    Assessment   Medical Diagnosis Right breast cancer   Onset Date/Surgical Date 10/21/14   Precautions   Precautions Other (comment)  Active breast cancer   Restrictions   Weight Bearing Restrictions No   Balance Screen   Has the patient fallen in the past 6 months No   Has the patient had a decrease in activity level because of a fear of falling?  No   Is the patient reluctant to leave their home because of a fear of falling?  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 she has telfa and tape overy wound at axilla which is shallow beefy red base  about 1x1 cm with no drainage and another ciruclar wound at lateral chest about .5 cm diameter round wound with base not visible that has minimal drainage   Skin Integrity intact other than open area described above   Observation/Other Assessments-Edema    Edema --  puffiness noted proximal to axillary incision    Sensation   Light Touch Appears Intact   Coordination   Gross Motor Movements are Fluid and Coordinated Yes   Posture/Postural Control   Posture/Postural Control Postural limitations   Postural Limitations Rounded Shoulders;Forward head   AROM   Right Shoulder Extension --   Right Shoulder Flexion 155 Degrees   Right  Shoulder ABduction 160 Degrees   Right Shoulder Internal Rotation --   Right Shoulder External Rotation --   Left Shoulder Extension --   Left Shoulder Flexion --   Left Shoulder ABduction --   Left Shoulder Internal Rotation --   Left Shoulder External Rotation --   Strength   Overall Strength Deficits;Other (comment)  left arm with some general weakness due to surgery           LYMPHEDEMA/ONCOLOGY QUESTIONNAIRE - 06/09/15 1628    Right Upper Extremity Lymphedema   10 cm Proximal to Olecranon Process 35.7 cm   Olecranon Process 29.6 cm   10 cm Proximal to Ulnar Styloid Process 26.4 cm   Just Proximal to Ulnar Styloid Process 19.3 cm   Across Hand at [employer] 20.6 cm   At Emma of 2nd Digit 7.5 cm           Quick Dash - 06/09/15 0001    Open a tight or new jar Mild difficulty   Do heavy household chores (wash walls, wash floors) Mild difficulty   Carry a shopping bag or briefcase Mild difficulty   Wash your back Mild difficulty   Use a knife to cut food No difficulty   Recreational activities in which you take some force or impact through your arm, shoulder, or hand (golf, hammering, tennis) Mild difficulty   During the past week, to what extent has your arm, shoulder or hand problem interfered with your normal social activities with family, friends, neighbors, or groups? Slightly   During the past week, to what extent has your arm, shoulder or hand problem limited your work or other regular daily activities Not at all   Arm, shoulder, or hand pain. Mild   Tingling (pins and needles) in your arm, shoulder, or hand None   Difficulty Sleeping No difficulty   DASH Score 15.91 %             [facility] Adult PT Treatment/Exercise - 06/09/15 0001    Self-Care   Other Self-Care Comments  instucted in lymphedema risk reduction practices, compression garment, and shoulder ROM exercise, encouraged walking                PT Education - 06/09/15 1712     Education provided Yes   Education Details Pt issued ABC class handout as she chose not to attend the class. suggested class 1 compression sleeve and gauntlet if she chooses to get one   Person(s) Educated Patient   Methods Explanation;Handout   Comprehension Verbalized understanding               [facility] Goals - 06/09/15 1716    CC Long Term Goal  #1   Title Patient with verbalize an understanding of lymphedema risk reduction precautions   Time 1  Period Days   Status Achieved   CC Long Term Goal  #2   Title Patient will be independent in a basic  home exercise program   Time 1   Period Days   Status Achieved            Plan - 06/09/15 1713    Clinical Impression Statement Pt comes in after her chemo and mastectomy surgery and continues to have some delayed healing with edema in axilla.  She has been doing her shoulder  exercises and her range of motion is actually better than it was pre-op. She plans to exercise at [facility]  program at [facility] .  Do not feel she needs skilled PT follow up at this time, but encourged her to came back if she does not have the reduction of axillary edema as her incision heals   Rehab Potential Good   Clinical Impairments Affecting Rehab Potential none   PT Frequency One time visit   Consulted and Agree with Plan of Care Patient         Problem List Patient Active Problem List   Diagnosis Date Noted  . S/P mastectomy 05/13/2015  . 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  . 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 Genetic testing 12/11/2014  . Hypokalemia 12/10/2014  . Chemotherapy induced neutropenia 11/26/2014  . Breast cancer of upper-outer quadrant of right female breast 10/23/2014  . Anxiety 04/29/2012  . ALLERGIC RHINITIS 11/29/2009  . 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 CONSTIPATION, CHRONIC 08/17/2008  . HYPERLIPIDEMIA 04/24/2008  . METABOLIC SYNDROME X  57/50/5183  . GERD 01/07/2008  . OBESITY, TRUNCAL 09/20/2007  . Essential hypertension 09/20/2007  . Multiple sclerosis 09/18/2007   Donato Heinz. Owens Shark, PT    06/09/2015, 5:21 PM  [place], [place], [address] Phone: [phone]   Fax:  [phone]

## 2015-06-15 ENCOUNTER — Telehealth: Payer: Self-pay | Admitting: Oncology

## 2015-06-15 ENCOUNTER — Other Ambulatory Visit (HOSPITAL_BASED_OUTPATIENT_CLINIC_OR_DEPARTMENT_OTHER): Payer: BC Managed Care – PPO

## 2015-06-15 ENCOUNTER — Ambulatory Visit (HOSPITAL_BASED_OUTPATIENT_CLINIC_OR_DEPARTMENT_OTHER): Payer: BC Managed Care – PPO | Admitting: Oncology

## 2015-06-15 VITALS — BP 123/71 | HR 84 | Temp 98.4°F | Resp 20 | Ht 65.0 in | Wt 209.4 lb

## 2015-06-15 DIAGNOSIS — I10 Essential (primary) hypertension: Secondary | ICD-10-CM | POA: Diagnosis not present

## 2015-06-15 DIAGNOSIS — G35 Multiple sclerosis: Secondary | ICD-10-CM

## 2015-06-15 DIAGNOSIS — C50411 Malignant neoplasm of upper-outer quadrant of right female breast: Secondary | ICD-10-CM | POA: Diagnosis not present

## 2015-06-15 LAB — CBC WITH DIFFERENTIAL/PLATELET
BASO%: 0.5 % (ref 0.0–2.0)
Basophils Absolute: 0 10*3/uL (ref 0.0–0.1)
EOS%: 7.1 % — AB (ref 0.0–7.0)
Eosinophils Absolute: 0.3 10*3/uL (ref 0.0–0.5)
HCT: 37 % (ref 34.8–46.6)
HGB: 12.4 g/dL (ref 11.6–15.9)
LYMPH%: 31.2 % (ref 14.0–49.7)
MCH: 31.7 pg (ref 25.1–34.0)
MCHC: 33.4 g/dL (ref 31.5–36.0)
MCV: 94.7 fL (ref 79.5–101.0)
MONO#: 0.6 10*3/uL (ref 0.1–0.9)
MONO%: 12.2 % (ref 0.0–14.0)
NEUT%: 49 % (ref 38.4–76.8)
NEUTROS ABS: 2.2 10*3/uL (ref 1.5–6.5)
PLATELETS: 265 10*3/uL (ref 145–400)
RBC: 3.91 10*6/uL (ref 3.70–5.45)
RDW: 17.2 % — ABNORMAL HIGH (ref 11.2–14.5)
WBC: 4.5 10*3/uL (ref 3.9–10.3)
lymph#: 1.4 10*3/uL (ref 0.9–3.3)

## 2015-06-15 LAB — COMPREHENSIVE METABOLIC PANEL (CC13)
ALT: 29 U/L (ref 0–55)
AST: 23 U/L (ref 5–34)
Albumin: 4 g/dL (ref 3.5–5.0)
Alkaline Phosphatase: 82 U/L (ref 40–150)
Anion Gap: 10 mEq/L (ref 3–11)
BILIRUBIN TOTAL: 0.41 mg/dL (ref 0.20–1.20)
BUN: 11.1 mg/dL (ref 7.0–26.0)
CO2: 26 meq/L (ref 22–29)
CREATININE: 0.8 mg/dL (ref 0.6–1.1)
Calcium: 9.8 mg/dL (ref 8.4–10.4)
Chloride: 107 mEq/L (ref 98–109)
EGFR: >90 mL/min/{1.73_m2} (ref >90)
GLUCOSE: 115 mg/dL (ref 70–140)
Potassium: 3.8 mEq/L (ref 3.5–5.1)
SODIUM: 143 meq/L (ref 136–145)
TOTAL PROTEIN: 7 g/dL (ref 6.4–8.3)

## 2015-06-15 MED ORDER — GABAPENTIN 100 MG PO CAPS: 100.0000 mg | ORAL_CAPSULE | Freq: Three times a day (TID) | ORAL | Status: DC

## 2015-06-15 MED ORDER — FERROUS GLUCONATE 324 (38 FE) MG PO TABS: 324.0000 mg | ORAL_TABLET | Freq: Two times a day (BID) | ORAL | Status: AC

## 2015-06-15 NOTE — Telephone Encounter (Signed)
Gave avs & calendar for January °

## 2015-06-15 NOTE — Progress Notes (Signed)
[place]  [phone] [phone]     ID: [name] [name] DOB: 18-Aug-1961  MR#: [id number]  TMH#:[id number]  Patient Care Team: [name], MD as PCP - General [name], MD as Consulting Physician (Obstetrics and Gynecology) [name], MD (Gastroenterology) [name]. [name], MD (Neurology) [name], MD as Consulting Physician (General Surgery) [name], MD as Consulting Physician (Oncology) [name], MD as Attending Physician (Radiation Oncology) [name], RN as Registered Nurse [name], RN as Registered Nurse OTHER MD:  CHIEF COMPLAINT: Triple negative breast cancer  CURRENT TREATMENT: Neoadjuvant chemotherapy  BREAST CANCER HISTORY: From the original intake note:  [name] [name] had bilateral screening mammography at the [facility] 11/21/2013. Breast density was category C. Exam was unremarkable. In January 2016 however the patient palpated a change in her right breast laterally. She contacted her gynecologist, Dr. [name], and he set her up for a unilateral right diagnostic mammography with right breast ultrasonography at [facility] 10/15/2014. There was a focal irregular opacity in the lateral right breast associated with pleomorphic calcifications. This was palpable on exam as a firm non-mobile mass. Ultrasound confirmed a hypoechoic mass in the right breast at the 9:00 position 5 cm from the nipple, measuring 2.9 cm. Next to this mass was a normal-sized intramammary lymph node measuring 4 mm. In the right axilla there were 3 contiguous level I lymph nodes the largest measuring 11 mm.  On 10/20/2014 the patient underwent right core needle biopsy of the 9:00 breast mass, showing ([id number]) and invasive ductal carcinoma, grade 3, triple negative, with the HER-2/neu signals ratio of 1.26 and number per cell 2.15. The MIB-1 was 71%. Biopsy of the largest right axillary lymph node was negative. Dr.  Owens Shark the mammographer who performed a biopsy describes this is concordant.  Left breast mammography 10/26/2014 was negative, and bilateral breast MRI the same day confirmed, in the right breast, and upper outer quadrant mass measuring 2.5 cm, with a second mass measuring 1.4 cm slightly anterior and superior to it. In addition there was a total area of approximately 6.5 cm of non-masslike enhancement extending throughout the upper outer quadrant of the right breast. There was a cortically thickened right axillary lymph node which had been biopsied. There was also a mildly cortically thickened right subpectoral lymph node. There were no internal mammary or left axillary lymph nodes in the left breast was unremarkable.  The patient's subsequent history is as detailed below.  INTERVAL HISTORY: [name] [name] returns today for follow up of her breast cancer. Since her last visit here she underwent right mastectomy with sentinel lymph node sampling, on 05/13/2015. The final pathology ([id number]) showed an area of 1.7 cm with significant fibrosis, but with scattered microscopic foci of residual invasive ductal carcinoma with high-grade features. HER-2 was attempted but is not reported. All 3 sentinel lymph nodes were benign. Margins were ample.  REVIEW OF SYSTEMS: [name] [name] did well with her surgery. The drains, which she For 2 weeks, were aggravating, but not so painful. She didn't have fever or bleeding problems. She does have a couple of areas in the right armpit which are "not quite healed" and she wanted me to look at. There have been no unusual headaches, visual changes, nausea, or vomiting. She is wondering what she is back to work. She did go to physical therapy and she was released: She was told "you're doing better now than before your surgery". 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 She takes gabapentin for that and it seems to be controlling the symptoms well. A detailed  review of systems today was otherwise stable.  PAST MEDICAL HISTORY: Past Medical History  Diagnosis Date  . OBESITY, TRUNCAL   . CONSTIPATION, CHRONIC   . Multiple sclerosis   . Allergic rhinitis due to other allergen   . HYPERTENSION   . HYPERLIPIDEMIA   . GERD   . Angioedema     ACEI  . Colon polyps 05/2014    adenomatous, colo q 5y  . Migraine headache   . Allergy   . Anxiety   . Breast cancer of upper-outer quadrant of right female breast 10/23/2014  . Hot flashes     PAST SURGICAL HISTORY: Past Surgical History  Procedure Laterality Date  . Tubal ligation    . Colonoscopy    . Portacath placement Left 11/06/2014    Procedure: INSERTION PORT-A-CATH;  Surgeon: [name], MD;  Location: [facility];  Service: General;  Laterality: Left;  . Simple mastectomy with axillary sentinel node biopsy Right 05/13/2015    Procedure: RIGHT TOTAL  MASTECTOMY WITH RIGHT  AXILLARY SENTINEL NODE BIOPSY;  Surgeon: [name], MD;  Location: [place] [facility];  Service: General;  Laterality: Right;  . Port-a-cath removal Left 05/13/2015    Procedure: REMOVAL PORT-A-CATH;  Surgeon: [name], MD;  Location: [facility];  Service: General;  Laterality: Left;    FAMILY HISTORY Family History  Problem Relation Age of Onset  . Coronary artery disease Mother   . Heart disease Mother   . Diabetes Mother   . Hypertension Other   . Hyperlipidemia Other   . Diabetes Other   . Colon cancer Neg Hx   . Alcohol abuse Father   . COPD Maternal Grandmother    the patient's father died at the age of 7 following his seizure. The patient's mother died from congestive 23 failure at the age of 36. The patient had 5 brothers and 2 sisters. One brother died from Escherichia coli infection, the other one had a history of seizure disorder. One sister died from causes unknown to the patient. There is no history of breast or ovarian cancer in the family.  GYNECOLOGIC HISTORY:  Patient's last  menstrual period was 11/01/2012. Menarche age 54, first live birth age 7. The patient is GX P2. She stopped having periods in May 2014. She did not take hormone replacement. She did take her control pills remotely for approximately 4 years with no complications.  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 She describes herself is single, and lives by herself. Her daughter [name] lives in [place] and works in Ambulance person for [employer]. Son Gambier "[name]" Ferrington is an Engineer, building services for [employer]: Not in place   HEALTH MAINTENANCE: Social History  Substance Use Topics  . Smoking status: Never Smoker   . Smokeless tobacco: Never Used     Comment: Single- brother staying with pt since mom's death  . Alcohol Use: No     Colonoscopy: September 2015/[name]  PAP: February 2015  Bone density:  Lipid panel:  Allergies  Allergen Reactions  . Ace Inhibitors Anaphylaxis     Angioedema (tongue swelling)  . Clarithromycin Anaphylaxis    Throat swells  . Levaquin [Levofloxacin] Other (See Comments)    Tendon pain - shoulder and calf    Current Outpatient  Prescriptions  Medication Sig Dispense Refill  . acetaminophen (TYLENOL) 500 MG tablet Take 1,000 mg by mouth 2 (two) times daily as needed (pain).    [name] ALPRAZolam (XANAX) 0.5 MG tablet Take 1 tablet (0.5 mg total) by mouth 3 (three) times daily as needed for anxiety or sleep. (Patient taking differently: Take 0.5 mg by mouth 2 (two) times daily as needed for anxiety or sleep. ) 30 tablet 1  . b complex vitamins tablet Take 1 tablet by mouth daily.    . ferrous gluconate (FERGON) 324 MG tablet Take 1 tablet (324 mg total) by mouth 2 (two) times daily with a meal. 60 tablet 3  . Glatiramer Acetate (COPAXONE) 40 MG/ML SOSY Inject 40 mg into the skin 3 (three) times a week. (Patient taking differently: Inject 40 mg into the skin every  Monday, Wednesday, and Friday. ) 12 Syringe 0  . hydrochlorothiazide (HYDRODIURIL) 25 MG tablet TAKE 1 TABLET DAILY 90 tablet 3  . loratadine-pseudoephedrine (CLARITIN-D 12 HOUR) 5-120 MG per tablet Take 1 tablet by mouth as needed for allergies. (Patient taking differently: Take 1 tablet by mouth daily as needed (seasonal allergies). ) 30 tablet 3  . losartan (COZAAR) 50 MG tablet Take 1 tablet (50 mg total) by mouth 2 (two) times daily. 180 tablet 3  . metoprolol (LOPRESSOR) 50 MG tablet TAKE 1 TABLET TWICE A DAY 180 tablet 3  . oxyCODONE (OXY IR/ROXICODONE) 5 MG immediate release tablet Take 1-2 tablets (5-10 mg total) by mouth every 6 (six) hours as needed for moderate pain, severe pain or breakthrough pain. (Patient not taking: Reported on 06/09/2015) 30 tablet 0  . pantoprazole (PROTONIX) 40 MG tablet TAKE 1 TABLET DAILY 90 tablet 3  . potassium chloride (MICRO-K) 10 MEQ CR capsule Take 1 capsule (10 mEq total) by mouth 2 (two) times daily. Pt taking 2 - 10 mEq tablets 2 times a day. Total dose 40 mEq daily. 180 capsule 0  . [DISCONTINUED] mometasone (NASONEX) 50 MCG/ACT nasal spray Place 2 sprays into the nose daily. 17 g 2   No current facility-administered medications for this visit.   Facility-Administered Medications Ordered in Other Visits  Medication Dose Route Frequency Provider Last Rate Last Dose  . sodium chloride 0.9 % injection 10 mL  10 mL Intracatheter PRN Genelle Gather Boelter, NP      . sodium chloride 0.9 % injection 10 mL  10 mL Intracatheter PRN Laurie Panda, NP        OBJECTIVE: Middle-aged African-American woman in no acute distress Filed Vitals:   06/15/15 0831  BP: 123/71  [name]: 84  Temp: 98.4 F (36.9 C)  Resp: 20     Body mass index is 34.85 kg/(m^2).    ECOG FS:1 - Symptomatic but completely ambulatory   Hair is just beginning to come in Sclerae unicteric, pupils round and equal Oropharynx clear and moist-- no thrush or other lesions No cervical or  supraclavicular adenopathy Lungs no rales or rhonchi Heart regular rate and rhythm Abd soft, nontender, positive bowel sounds MSK no focal spinal tenderness, no upper extremity lymphedema Neuro: nonfocal, well oriented, appropriate affect Breasts: The right breast is status post mastectomy. The incision is flat and well-healed. There are 2 Steri-Strips in the medial aspect still present. In the axilla there are 2 small areas of dehiscence, both very shallow, one at the incision and the other one at the site of drain. These are being bandaged today. Skin: Hyperpigmented nails as previously noted  LAB RESULTS:  CMP     Component Value Date/Time   NA 141 05/14/2015 0327   NA 140 04/21/2015 1055   K 3.4* 05/14/2015 0327   K 3.3* 04/21/2015 1055   CL 107 05/14/2015 0327   CO2 26 05/14/2015 0327   CO2 27 04/21/2015 1055   GLUCOSE 100* 05/14/2015 0327   GLUCOSE 114 04/21/2015 1055   BUN 6 05/14/2015 0327   BUN 11.0 04/21/2015 1055   CREATININE 0.62 05/14/2015 0327   CREATININE 0.7 04/21/2015 1055   CALCIUM 8.7* 05/14/2015 0327   CALCIUM 9.3 04/21/2015 1055   PROT 6.9 04/21/2015 1055   PROT 7.3 12/31/2014 1102   ALBUMIN 4.1 04/21/2015 1055   ALBUMIN 4.0 12/31/2014 1102   AST 18 04/21/2015 1055   AST 17 12/31/2014 1102   ALT 22 04/21/2015 1055   ALT 13 12/31/2014 1102   ALKPHOS 95 04/21/2015 1055   ALKPHOS 101 12/31/2014 1102   BILITOT 0.88 04/21/2015 1055   BILITOT 0.4 12/31/2014 1102   GFRNONAA >60 05/14/2015 0327   GFRAA >60 05/14/2015 0327    INo results found for: SPEP, UPEP  Lab Results  Component Value Date   WBC 4.5 06/15/2015   NEUTROABS 2.2 06/15/2015   HGB 12.4 06/15/2015   HCT 37.0 06/15/2015   MCV 94.7 06/15/2015   PLT 265 06/15/2015      Chemistry      Component Value Date/Time   NA 141 05/14/2015 0327   NA 140 04/21/2015 1055   K 3.4* 05/14/2015 0327   K 3.3* 04/21/2015 1055   CL 107 05/14/2015 0327   CO2 26 05/14/2015 0327   CO2 27  04/21/2015 1055   BUN 6 05/14/2015 0327   BUN 11.0 04/21/2015 1055   CREATININE 0.62 05/14/2015 0327   CREATININE 0.7 04/21/2015 1055      Component Value Date/Time   CALCIUM 8.7* 05/14/2015 0327   CALCIUM 9.3 04/21/2015 1055   ALKPHOS 95 04/21/2015 1055   ALKPHOS 101 12/31/2014 1102   AST 18 04/21/2015 1055   AST 17 12/31/2014 1102   ALT 22 04/21/2015 1055   ALT 13 12/31/2014 1102   BILITOT 0.88 04/21/2015 1055   BILITOT 0.4 12/31/2014 1102       No results found for: LABCA2  No components found for: LABCA125  No results for input(s): INR in the last 168 hours.  Urinalysis    Component Value Date/Time   COLORURINE YELLOW 01/14/2014 1206   APPEARANCEUR CLEAR 01/14/2014 1206   LABSPEC <=1.005* 01/14/2014 1206   PHURINE 5.5 01/14/2014 1206   GLUCOSEU NEGATIVE 01/14/2014 1206   HGBUR NEGATIVE 01/14/2014 1206   HGBUR negative 08/19/2010 1041   BILIRUBINUR NEGATIVE 01/14/2014 1206   KETONESUR NEGATIVE 01/14/2014 1206   UROBILINOGEN 0.2 01/14/2014 1206   NITRITE NEGATIVE 01/14/2014 1206   LEUKOCYTESUR NEGATIVE 01/14/2014 1206    STUDIES: US Venous Img Lower Unilateral Right  05/31/2015   CLINICAL DATA:  Right leg pain and swelling  EXAM: RIGHT LOWER EXTREMITY VENOUS DOPPLER ULTRASOUND  TECHNIQUE: Gray-scale sonography with graded compression, as well as color Doppler and duplex ultrasound were performed to evaluate the lower extremity deep venous systems from the level of the common femoral vein and including the common femoral, femoral, profunda femoral, popliteal and calf veins including the posterior tibial, peroneal and gastrocnemius veins when visible. The superficial great saphenous vein was also interrogated. Spectral Doppler was utilized to evaluate flow at rest and with distal augmentation maneuvers in the common femoral, femoral  and popliteal veins.  COMPARISON:  None.  FINDINGS: Contralateral Common Femoral Vein: Respiratory phasicity is normal and symmetric with  the symptomatic side. No evidence of thrombus. Normal compressibility.  Common Femoral Vein: No evidence of thrombus. Normal compressibility, respiratory phasicity and response to augmentation.  Saphenofemoral Junction: No evidence of thrombus. Normal compressibility and flow on color Doppler imaging.  Profunda Femoral Vein: No evidence of thrombus. Normal compressibility and flow on color Doppler imaging.  Femoral Vein: No evidence of thrombus. Normal compressibility, respiratory phasicity and response to augmentation.  Popliteal Vein: No evidence of thrombus. Normal compressibility, respiratory phasicity and response to augmentation.  Calf Veins: No evidence of thrombus. Normal compressibility and flow on color Doppler imaging.  Superficial Great Saphenous Vein: No evidence of thrombus. Normal compressibility and flow on color Doppler imaging.  Venous Reflux:  None.  Other Findings:  None.  IMPRESSION: No evidence of deep venous thrombosis.   Electronically Signed   By: Jerilynn Mages.  Shick M.D.   On: 05/31/2015 13:42    ASSESSMENT: 54 y.o. 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   2) neoadjuvant chemotherapy consisting of cyclophosphamide and doxorubicin in dose dense fashion 4 given with Neulasta support, completed 01/06/2015, followed by weekly carboplatin and paclitaxel 12 completed 04/21/2015  (3) status post right mastectomy and sentinel lymph node sampling 05/13/2015 for a ypT1c ypN) invasive ductal  carcinoma, high-grade, with ample margins.  (4) adjuvant radiation not necessary  PLAN: [name] [name] did terrific with surgery and I went over her surgical results with her today. She understands that the 1.7 cm area of tumor was mostly scar tissue with scattered microscopic foci of residual cancer. The margins were ample. All 3 lymph nodes were clear. Accordingly she will not need adjuvant radiation and she is essentially done with her active treatment.  We then discussed follow-up. Generally we see patients like her every 3 months for 2 years, alternating with the surgeon, and then every 6 months for 3 years, after which she can "graduate" from follow-up  We discussed the finding your new normal group. She will be added terrific candidate for this. She is signing up for the February group.  She will be due for repeat mammography in March. We will see her again in January. If Dr. Donne Hazel sees her in April he can see her shortly after her March mammography.  I refilled her gabapentin and iron tablets.  She has a good understanding of the overall plan. She agrees with it. She knows the goal of treatment in her case is cure. She will call with any problems that may develop before her next visit here.     [name], MD   06/15/2015 8:37 AM

## 2015-06-16 ENCOUNTER — Other Ambulatory Visit: Payer: Self-pay | Admitting: *Deleted

## 2015-06-16 ENCOUNTER — Telehealth: Payer: Self-pay | Admitting: Oncology

## 2015-06-16 DIAGNOSIS — C50411 Malignant neoplasm of upper-outer quadrant of right female breast: Secondary | ICD-10-CM

## 2015-06-16 NOTE — Telephone Encounter (Signed)
Called and left a message with survivorship

## 2015-06-22 ENCOUNTER — Ambulatory Visit: Payer: BC Managed Care – PPO | Admitting: Physical Therapy

## 2015-06-29 ENCOUNTER — Encounter: Payer: BC Managed Care – PPO | Admitting: Nurse Practitioner

## 2015-06-29 ENCOUNTER — Ambulatory Visit (HOSPITAL_BASED_OUTPATIENT_CLINIC_OR_DEPARTMENT_OTHER): Payer: BC Managed Care – PPO | Admitting: Nurse Practitioner

## 2015-06-29 ENCOUNTER — Encounter: Payer: Self-pay | Admitting: Nurse Practitioner

## 2015-06-29 VITALS — BP 123/70 | HR 96 | Temp 98.2°F | Resp 18 | Ht 65.0 in | Wt 213.0 lb

## 2015-06-29 DIAGNOSIS — G4700 Insomnia, unspecified: Secondary | ICD-10-CM | POA: Diagnosis not present

## 2015-06-29 DIAGNOSIS — C50811 Malignant neoplasm of overlapping sites of right female breast: Secondary | ICD-10-CM | POA: Diagnosis not present

## 2015-06-29 DIAGNOSIS — C50411 Malignant neoplasm of upper-outer quadrant of right female breast: Secondary | ICD-10-CM

## 2015-06-29 NOTE — Progress Notes (Signed)
CLINIC:  Cancer Survivorship   REASON FOR VISIT:  Routine follow-up post-treatment for a recent history of breast cancer.  BRIEF ONCOLOGIC HISTORY:    Breast cancer of upper-outer quadrant of right female breast ([place])   10/15/2014 Mammogram Right breast: new 2.9 cm mass with pleomorphic calcifications highly suspicious for a breast malignancy. Abnormal lymph nodes, the largest measuring 11 mm, are also suspicious for metastatic adenopathy.   10/20/2014 Initial Biopsy Right breast needle core biopsy: invasive ductal carcinoma, ER- (0%), PR- (0%), HER2/neu negative, Ki67 71%. DCIS. Bx of right axillary LN: negative for malignancy   10/26/2014 Breast MRI Right breast: Within the UOQ,  there is a 2.2 x 2.5 x 2.1 cm irregular enhancing mass with central susceptibility artifact.~5 mm anterior and superior to this is an additional 1.4 x 0.9 x 1.2 cm irregular enhancing mass, suspicious.   10/26/2014 Breast MRI Right breast: Superior to the biopsied mass within the UOQ is approximately 6.5 x 4.8 cm of clumped non mass enhancement which extends from the level of the mass throughout the upper-outer quadrant of the right breast   10/30/2014 Procedure Right axillary LN bx: negative for malignancy   11/02/2014 Procedure Breast high / moderate risk ([facility]) reveals no clinically significant variant at ATM, BRCA1, BRCA2, CDH1, CHEK2, PALB2, PTEN, STK11, and TP53.   11/05/2014 Procedure Right breast core needle bx: microinvasive ductal carcinoma (<0.1 cm) with associated high grade DCIS. ER- (0%), PR- (0%), HER2/neu negative (ratio 1.26), Ki67 63%   11/05/2014 Clinical Stage Stage IIA: T2 N0   11/19/2014 - 04/21/2015 Neo-Adjuvant Chemotherapy doxorubicin and cyclophosphamide x 4 cycles; carboplatin x 11 and paclitaxel x 12 cycles   05/13/2015 Definitive Surgery Right mastectomy/SLNB Donne Hazel): IDC with extensive fibrosis, DCIS with calcs, focal usual ductal hyperplasia. HER2/neu repeated and negative (ratio 1.26). 3 LN  removed and negative   05/13/2015 Pathologic Stage Stage IA: ypT1c ypN0    INTERVAL HISTORY:  [name] [name] presents to the [facility] today for our initial meeting to review her survivorship care plan detailing her treatment course for breast cancer, as well as monitoring long-term side effects of that treatment, education regarding health maintenance, screening, and overall wellness and health promotion.     Overall, [name] [name] reports feeling pretty well since completing her definitive surgery last month. She continues with fatigue following the chemotherapy, but feels that this is improving, as is the peripheral neuropathy she developed following her last cycle of paclitaxel.  She denies any pain or change along her mastectomy incision or in her left breast. She has had no headache, cough, shortness of breath, or weight change.  She is very interested in learning more about healthy food choices as a cancer survivor.  She does have hot flashes, which are ongoing, and has had difficulty sleeping. She is planning on obtaining a lymphedema compression stocking to use when flying as prophylaxis.  REVIEW OF SYSTEMS:  General: Hot flashes and fatigue as above.Denies fever or chills.  Cardiac: Denies palpitations, chest pain, and lower extremity edema.  Respiratory: Denies dyspnea on exertion.  GI: Denies abdominal pain, constipation, diarrhea, nausea, or vomiting.  GU: Denies dysuria, hematuria, vaginal bleeding, vaginal discharge, or vaginal dryness.  Musculoskeletal: Denies joint or bone pain.  Neuro: Peripheral neuropathy as above. Denies headache or recent falls. Skin: Denies rash, pruritis, or open wounds.  Breast: Denies any new nodularity, masses, tenderness, nipple changes, or nipple discharge in her left breast.  Psych: Denies depression, anxiety, or memory loss.   A  14-point review of systems was completed and was negative, except as noted above.   ONCOLOGY TREATMENT TEAM:    1. Surgeon:  Dr. Donne [facility]el at [facility]  2. Medical Oncologist: Dr. Jana Hakim     PAST MEDICAL/SURGICAL HISTORY:  Past Medical History  Diagnosis Date  . OBESITY, TRUNCAL   . CONSTIPATION, CHRONIC   . Multiple sclerosis ([place])   . Allergic rhinitis due to other allergen   . HYPERTENSION   . HYPERLIPIDEMIA   . GERD   . Angioedema     ACEI  . Colon polyps 05/2014    adenomatous, colo q 5y  . Migraine headache   . Allergy   . Anxiety   . Breast cancer of upper-outer quadrant of right female breast ([place]) 10/23/2014  . Hot flashes    Past Surgical History  Procedure Laterality Date  . Tubal ligation    . Colonoscopy    . Portacath placement Left 11/06/2014    Procedure: INSERTION PORT-A-CATH;  Surgeon: [name], MD;  Location: [place];  Service: General;  Laterality: Left;  . Simple mastectomy with axillary sentinel node biopsy Right 05/13/2015    Procedure: RIGHT TOTAL  MASTECTOMY WITH RIGHT  AXILLARY SENTINEL NODE BIOPSY;  Surgeon: [name], MD;  Location: [place];  Service: General;  Laterality: Right;  . Port-a-cath removal Left 05/13/2015    Procedure: REMOVAL PORT-A-CATH;  Surgeon: [name], MD;  Location: [place];  Service: General;  Laterality: Left;     ALLERGIES:  Allergies  Allergen Reactions  . Ace Inhibitors Anaphylaxis     Angioedema (tongue swelling)  . Clarithromycin Anaphylaxis    Throat swells  . Levaquin [Levofloxacin] Other (See Comments)    Tendon pain - shoulder and calf     CURRENT MEDICATIONS:  Current Outpatient Prescriptions on File Prior to Visit  Medication Sig Dispense Refill  . acetaminophen (TYLENOL) 500 MG tablet Take 1,000 mg by mouth 2 (two) times daily as needed (pain).    [name] ALPRAZolam (XANAX) 0.5 MG tablet Take 1 tablet (0.5 mg total) by mouth 3 (three) times daily as needed for anxiety or sleep. (Patient taking differently: Take 0.5 mg by mouth 2 (two) times daily as needed for  anxiety or sleep. ) 30 tablet 1  . b complex vitamins tablet Take 1 tablet by mouth daily.    . ferrous gluconate (FERGON) 324 MG tablet Take 1 tablet (324 mg total) by mouth 2 (two) times daily with a meal. 180 tablet 3  . gabapentin (NEURONTIN) 100 MG capsule Take 1 capsule (100 mg total) by mouth 3 (three) times daily. 270 capsule 12  . Glatiramer Acetate (COPAXONE) 40 MG/ML SOSY Inject 40 mg into the skin 3 (three) times a week. (Patient taking differently: Inject 40 mg into the skin every Monday, Wednesday, and Friday. ) 12 Syringe 0  . hydrochlorothiazide (HYDRODIURIL) 25 MG tablet TAKE 1 TABLET DAILY 90 tablet 3  . loratadine-pseudoephedrine (CLARITIN-D 12 HOUR) 5-120 MG per tablet Take 1 tablet by mouth as needed for allergies. (Patient taking differently: Take 1 tablet by mouth daily as needed (seasonal allergies). ) 30 tablet 3  . losartan (COZAAR) 50 MG tablet Take 1 tablet (50 mg total) by mouth 2 (two) times daily. 180 tablet 3  . metoprolol (LOPRESSOR) 50 MG tablet TAKE 1 TABLET TWICE A DAY 180 tablet 3  . pantoprazole (PROTONIX) 40 MG tablet TAKE 1 TABLET DAILY 90 tablet 3  . potassium chloride (MICRO-K) 10 MEQ CR  capsule Take 1 capsule (10 mEq total) by mouth 2 (two) times daily. Pt taking 2 - 10 mEq tablets 2 times a day. Total dose 40 mEq daily. 180 capsule 0  . [DISCONTINUED] mometasone (NASONEX) 50 MCG/ACT nasal spray Place 2 sprays into the nose daily. 17 g 2   No current facility-administered medications on file prior to visit.     ONCOLOGIC FAMILY HISTORY:  Family History  Problem Relation Age of Onset  . Coronary artery disease Mother   . Heart disease Mother   . Diabetes Mother   . Hypertension Other   . Hyperlipidemia Other   . Diabetes Other   . Colon cancer Neg Hx   . Alcohol abuse Father   . COPD Maternal Grandmother      GENETIC COUNSELING/TESTING: Performed 11/02/2014: Breast high / moderate risk ([facility]) panel reveals no clinically significant variant  at ATM, BRCA1, BRCA2, CDH1, CHEK2, PALB2, PTEN, STK11, and TP53.  SOCIAL HISTORY:  [name] [name] is single and lives alone in [place], [place].  She has 2 children.  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 She denies any current or history of tobacco, alcohol, or illicit drug use.     PHYSICAL EXAMINATION:  Vital Signs:   Filed Vitals:   06/29/15 1520  BP: 123/70  Pulse: 96  Temp: 98.2 F (36.8 C)  Resp: 18   General: Well-nourished, well-appearing female in no acute distress.  She is unaccompanied in clinic today.   HEENT: Head is atraumatic and normocephalic.  Hair is beginning to regrow. Pupils equal and reactive to light and accomodation. Conjunctivae clear without exudate.  Sclerae anicteric. Oral mucosa is pink, moist, and intact without lesions.  Oropharynx is pink without lesions or erythema.  Lymph: No cervical, supraclavicular, infraclavicular, or axillary lymphadenopathy noted on palpation.  Cardiovascular: Regular rate and rhythm without murmurs, rubs, or gallops. Respiratory: Clear to auscultation bilaterally. Chest expansion symmetric without accessory muscle use on inspiration or expiration.  Breast: Mastectomy scar at right breast without nodularity or mass.  Well healed.  GI: Abdomen soft and round. No tenderness to palpation. Bowel sounds normoactive in 4 quadrants.  GU: Deferred.  Musculoskeletal: Muscle strength 5/5 in all extremities.  Full ROM noted in all extremities.  Neuro: No focal deficits. Steady gait.  Psych: Mood and affect normal and appropriate for situation.  Extremities: No edema, cyanosis, or clubbing.  Skin: Warm and dry. No open lesions noted.   LABORATORY DATA:  None for this visit.  DIAGNOSTIC IMAGING:  None for this visit.     ASSESSMENT AND PLAN:   1. 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  [name] [name] is doing well with no clinical symptoms worrisome for disease progression at this time.  She will follow-up with Gentry Fitz NP in January 2017 with history and physical exam per surveillance protocol.  A comprehensive survivorship care plan and treatment summary was reviewed with the patient today detailing her breast cancer diagnosis, treatment course, potential late/long-term effects of treatment, appropriate follow-up care with recommendations for the future, and patient education resources.  A copy of this summary, along with a letter will be sent to the patient's primary care provider via in basket message after today's  visit.  [name] [name] is welcome to return to the Survivorship Clinic in the future, as needed; no follow-up will be scheduled at this time.    2. Insomnia: We have discussed [name] [name]'s difficulty sleeping and interventions to address it including implementation of a regular schedule to go to bed and wake up, avioding watching of tv for an hour prior to bed, and good sleep hygiene strategies.  She will implement these and see if it improves her sleep.     3. Cancer screening:  Due to [name] [name]'s history and her age, she should receive screening for skin cancers, colon cancer, and gynecologic cancers.  The information and recommendations are listed on the patient's comprehensive care plan/treatment summary and were reviewed in detail with the patient.    4. Health maintenance and wellness promotion: [name] [name] was encouraged to consume 5-7 servings of fruits and vegetables per day. We reviewed the "Nutrition Rainbow" handout, as well as the handout about "Nutrition for Breast Cancer Survivors."  [name] [name] has requested additional information on healthy meal  options to help reduce her risk of recurrence, which I will compile and mail to her following today's visit. She was also encouraged to engage in moderate to vigorous exercise for 30 minutes per day most days of the week. We discussed the [employer] fitness program, which is designed for cancer survivors to help them become more physically fit after cancer treatments.  She was instructed to continue to limit her alcohol consumption and continue to abstain from tobacco use.   5. Support services/counseling: It is not uncommon for this period of the patient's cancer care trajectory to be one of many emotions and stressors.  We discussed an opportunity for her to participate in the next session of [facility] ("Finding Your New Normal") support group series designed for patients after they have completed treatment.   [name] [name] was encouraged to take advantage of our many other support services programs, support groups, and/or counseling in coping with her new life as a cancer survivor after completing anti-cancer treatment.  She was offered support today through active listening and expressive supportive counseling.  She was given information regarding our available services and encouraged to contact me with any questions or for help enrolling in any of our support group/programs.    A total of 50 minutes of face-to-face time was spent with this patient with greater than 50% of that time in counseling and care-coordination.   Sylvan Cheese, NP  Survivorship Program [facility] [phone]   Note: PRIMARY CARE PROVIDER Gwendolyn Grant, [place] [phone]

## 2015-07-05 ENCOUNTER — Encounter: Payer: Self-pay | Admitting: Internal Medicine

## 2015-07-05 ENCOUNTER — Ambulatory Visit (INDEPENDENT_AMBULATORY_CARE_PROVIDER_SITE_OTHER): Payer: BC Managed Care – PPO | Admitting: Internal Medicine

## 2015-07-05 VITALS — BP 124/70 | HR 100 | Temp 98.6°F | Ht 65.0 in | Wt 214.2 lb

## 2015-07-05 DIAGNOSIS — J069 Acute upper respiratory infection, unspecified: Secondary | ICD-10-CM | POA: Diagnosis not present

## 2015-07-05 DIAGNOSIS — J209 Acute bronchitis, unspecified: Secondary | ICD-10-CM

## 2015-07-05 MED ORDER — AMOXICILLIN 500 MG PO CAPS
500.0000 mg | ORAL_CAPSULE | Freq: Three times a day (TID) | ORAL | Status: DC
Start: 2015-07-05 — End: 2015-08-30

## 2015-07-05 NOTE — Progress Notes (Signed)
   Subjective:    Patient ID: [name] [name], female    DOB: 10/29/1960, 54 y.o.   MRN: [id number]  HPI Her symptoms began 06/30/15 as scratchy throat and rhinitis.This became yellow/orange nasal discharge. Subsequently she's developed some similar color to her sputum. The greater volume comes from the nose than from the chest.  She's had some wheezing. She's had some watery eyes as well as some sneezing.  She's been using Tylenol and Claritin-D.  She had returned to work 06/28/15 after being out 7 months following treatment of her breast cancer.  Review of Systems She denies frontal headache, facial pain, otic pain, otic discharge. She has no associated shortness of breath.    Objective:   Physical Exam  There is some thinning of her hair. There is some tenderness in the right axillary area; a thorough exam could not be completed because of this.  General appearance:Adequately nourished; no acute distress or increased work of breathing is present.    Lymphatic: No  lymphadenopathy about the head, neck, or axilla .  Eyes: No conjunctival inflammation or lid edema is present. There is no scleral icterus.  Ears:  External ear exam shows no significant lesions or deformities.  Otoscopic examination reveals clear canals, tympanic membranes are intact bilaterally without bulging, retraction, inflammation or discharge.  Nose:  External nasal examination shows no deformity or inflammation. Nasal mucosa are erythematous but moist without lesions or exudates No septal dislocation or deviation.No obstruction to airflow.   Oral exam: Dental hygiene is good; lips and gums are healthy appearing.There is no oropharyngeal erythema or exudate .  Neck:  No deformities, thyromegaly, masses, or tenderness noted.   Supple with full range of motion without pain.   Heart:  Normal rate and regular rhythm. S1 and S2 normal without gallop, murmur, click, rub or other extra sounds.   Lungs:Chest clear  to auscultation; no wheezes, rhonchi,rales ,or rubs present.  Extremities:  No cyanosis, edema, or clubbing  noted    Skin: Warm & dry w/o tenting . No significant lesions or rash.     Assessment & Plan:  #1 acute bronchitis w/o bronchospasm #2 URI, acute Plan: See orders and recommendations

## 2015-07-05 NOTE — Progress Notes (Signed)
Pre visit review using our clinic review tool, if applicable. No additional management support is needed unless otherwise documented below in the visit note. 

## 2015-07-05 NOTE — Patient Instructions (Signed)

## 2015-07-19 ENCOUNTER — Telehealth: Payer: Self-pay | Admitting: *Deleted

## 2015-07-19 NOTE — Telephone Encounter (Signed)
Received call from patient stating her neuropathy seems to be worse now than when she finished chemo.  She is taking Neurontin 100mg  TID.  Per Nira Conn patient can increase Neurontin to 200mg  TID and make sure she is taking a B-complex vitamin.  Patient verbalized understanding.

## 2015-07-21 ENCOUNTER — Telehealth: Payer: Self-pay | Admitting: *Deleted

## 2015-07-21 MED ORDER — PREDNISONE 10 MG (21) PO TBPK: 10.0000 mg | ORAL_TABLET | ORAL | Status: DC

## 2015-07-21 NOTE — Telephone Encounter (Signed)
Received call pt states she is having a MS flare-up needing to get a refill on steroid dose pack. Verified pharmacy inform sending to [facility]...Johny Chess

## 2015-08-15 ENCOUNTER — Other Ambulatory Visit: Payer: Self-pay | Admitting: Internal Medicine

## 2015-08-18 ENCOUNTER — Other Ambulatory Visit: Payer: Self-pay | Admitting: *Deleted

## 2015-08-18 ENCOUNTER — Other Ambulatory Visit: Payer: Self-pay | Admitting: Nurse Practitioner

## 2015-08-18 DIAGNOSIS — C50411 Malignant neoplasm of upper-outer quadrant of right female breast: Secondary | ICD-10-CM

## 2015-08-18 MED ORDER — METHYLPREDNISOLONE 4 MG PO TBPK: ORAL_TABLET | ORAL | Status: DC

## 2015-08-19 ENCOUNTER — Other Ambulatory Visit: Payer: Self-pay | Admitting: Oncology

## 2015-08-30 ENCOUNTER — Ambulatory Visit (INDEPENDENT_AMBULATORY_CARE_PROVIDER_SITE_OTHER): Payer: BC Managed Care – PPO | Admitting: Internal Medicine

## 2015-08-30 ENCOUNTER — Other Ambulatory Visit (INDEPENDENT_AMBULATORY_CARE_PROVIDER_SITE_OTHER): Payer: BC Managed Care – PPO

## 2015-08-30 ENCOUNTER — Encounter: Payer: Self-pay | Admitting: Internal Medicine

## 2015-08-30 VITALS — BP 120/70 | HR 74 | Temp 98.2°F | Ht 65.0 in | Wt 217.5 lb

## 2015-08-30 DIAGNOSIS — E785 Hyperlipidemia, unspecified: Secondary | ICD-10-CM | POA: Diagnosis not present

## 2015-08-30 DIAGNOSIS — Z0000 Encounter for general adult medical examination without abnormal findings: Secondary | ICD-10-CM

## 2015-08-30 DIAGNOSIS — Z114 Encounter for screening for human immunodeficiency virus [HIV]: Secondary | ICD-10-CM | POA: Diagnosis not present

## 2015-08-30 DIAGNOSIS — C50411 Malignant neoplasm of upper-outer quadrant of right female breast: Secondary | ICD-10-CM

## 2015-08-30 DIAGNOSIS — Z1159 Encounter for screening for other viral diseases: Secondary | ICD-10-CM | POA: Diagnosis not present

## 2015-08-30 DIAGNOSIS — E65 Localized adiposity: Secondary | ICD-10-CM | POA: Diagnosis not present

## 2015-08-30 DIAGNOSIS — R7989 Other specified abnormal findings of blood chemistry: Secondary | ICD-10-CM

## 2015-08-30 DIAGNOSIS — G35 Multiple sclerosis: Secondary | ICD-10-CM

## 2015-08-30 LAB — URINALYSIS, ROUTINE W REFLEX MICROSCOPIC
BILIRUBIN URINE: NEGATIVE
Hgb urine dipstick: NEGATIVE
KETONES UR: NEGATIVE
LEUKOCYTES UA: NEGATIVE
Nitrite: NEGATIVE
PH: 5.5 (ref 5.0–8.0)
RBC / HPF: NONE SEEN (ref 0–?)
Specific Gravity, Urine: 1.015 (ref 1.000–1.030)
TOTAL PROTEIN, URINE-UPE24: NEGATIVE
UROBILINOGEN UA: 0.2 (ref 0.0–1.0)
Urine Glucose: NEGATIVE

## 2015-08-30 LAB — HEPATIC FUNCTION PANEL
ALK PHOS: 84 U/L (ref 39–117)
ALT: 18 U/L (ref 0–35)
AST: 17 U/L (ref 0–37)
Albumin: 4.2 g/dL (ref 3.5–5.2)
BILIRUBIN DIRECT: 0.1 mg/dL (ref 0.0–0.3)
TOTAL PROTEIN: 7 g/dL (ref 6.0–8.3)
Total Bilirubin: 0.5 mg/dL (ref 0.2–1.2)

## 2015-08-30 LAB — BASIC METABOLIC PANEL
BUN: 18 mg/dL (ref 6–23)
CHLORIDE: 105 meq/L (ref 96–112)
CO2: 29 mEq/L (ref 19–32)
CREATININE: 0.93 mg/dL (ref 0.40–1.20)
Calcium: 9.7 mg/dL (ref 8.4–10.5)
GFR: 81.02 mL/min (ref >60.00)
Glucose, Bld: 94 mg/dL (ref 70–99)
Potassium: 4 mEq/L (ref 3.5–5.1)
Sodium: 143 mEq/L (ref 135–145)

## 2015-08-30 LAB — LIPID PANEL
Cholesterol: 218 mg/dL — ABNORMAL HIGH (ref 0–200)
HDL: 53.6 mg/dL (ref >39.00)
NONHDL: 164.85
Total CHOL/HDL Ratio: 4
Triglycerides: 208 mg/dL — ABNORMAL HIGH (ref 0.0–149.0)
VLDL: 41.6 mg/dL — ABNORMAL HIGH (ref 0.0–40.0)

## 2015-08-30 LAB — CBC WITH DIFFERENTIAL/PLATELET
BASOS ABS: 0 10*3/uL (ref 0.0–0.1)
BASOS PCT: 0.4 % (ref 0.0–3.0)
EOS ABS: 0.2 10*3/uL (ref 0.0–0.7)
Eosinophils Relative: 2.8 % (ref 0.0–5.0)
HEMATOCRIT: 39.3 % (ref 36.0–46.0)
HEMOGLOBIN: 13.2 g/dL (ref 12.0–15.0)
LYMPHS PCT: 37.9 % (ref 12.0–46.0)
Lymphs Abs: 2.1 10*3/uL (ref 0.7–4.0)
MCHC: 33.5 g/dL (ref 30.0–36.0)
MCV: 87.1 fl (ref 78.0–100.0)
Monocytes Absolute: 0.5 10*3/uL (ref 0.1–1.0)
Monocytes Relative: 9.2 % (ref 3.0–12.0)
Neutro Abs: 2.8 10*3/uL (ref 1.4–7.7)
Neutrophils Relative %: 49.7 % (ref 43.0–77.0)
Platelets: 242 10*3/uL (ref 150.0–400.0)
RBC: 4.51 Mil/uL (ref 3.87–5.11)
RDW: 16.3 % — ABNORMAL HIGH (ref 11.5–15.5)
WBC: 5.6 10*3/uL (ref 4.0–10.5)

## 2015-08-30 LAB — LDL CHOLESTEROL, DIRECT: Direct LDL: 139 mg/dL

## 2015-08-30 LAB — TSH: TSH: 1.31 u[IU]/mL (ref 0.35–4.50)

## 2015-08-30 MED ORDER — PREDNISONE 10 MG (21) PO TBPK: ORAL_TABLET | ORAL | Status: DC

## 2015-08-30 MED ORDER — PANTOPRAZOLE SODIUM 40 MG PO TBEC: 40.0000 mg | DELAYED_RELEASE_TABLET | Freq: Every day | ORAL | Status: DC

## 2015-08-30 MED ORDER — GLATIRAMER ACETATE 40 MG/ML ~~LOC~~ SOSY: 40.0000 mg | PREFILLED_SYRINGE | SUBCUTANEOUS | Status: DC

## 2015-08-30 MED ORDER — METOPROLOL TARTRATE 50 MG PO TABS: 50.0000 mg | ORAL_TABLET | Freq: Two times a day (BID) | ORAL | Status: DC

## 2015-08-30 MED ORDER — LORATADINE-PSEUDOEPHEDRINE ER 5-120 MG PO TB12: 1.0000 | ORAL_TABLET | Freq: Every day | ORAL | Status: DC | PRN

## 2015-08-30 MED ORDER — HYDROCHLOROTHIAZIDE 25 MG PO TABS: 25.0000 mg | ORAL_TABLET | Freq: Every day | ORAL | Status: DC

## 2015-08-30 MED ORDER — POTASSIUM CHLORIDE ER 10 MEQ PO CPCR: 20.0000 meq | ORAL_CAPSULE | Freq: Two times a day (BID) | ORAL | Status: DC

## 2015-08-30 MED ORDER — LOSARTAN POTASSIUM 50 MG PO TABS: 50.0000 mg | ORAL_TABLET | Freq: Two times a day (BID) | ORAL | Status: DC

## 2015-08-30 NOTE — Assessment & Plan Note (Signed)
Dx 09/2003 - initially on capaxone Stable without flares for many years until fall 2013, then resumed medical maintenance treatment: copaxone  Mild RLE flare early 06/2013: numbness numbness consistent with prior flares per patient -s/p pred taper x 6 days and symptoms resolved; uses pred pak prn "mild flares" - refill same today  further eval ongoing by neuro as needed if recurring/severe symptoms  - Dr Hyman Bower @ [facility] in [facility]

## 2015-08-30 NOTE — Assessment & Plan Note (Signed)
  Wt Readings from Last 3 Encounters:  08/30/15 217 lb 8 oz (98.657 kg)  07/05/15 214 lb 4 oz (97.183 kg)  06/29/15 213 lb (96.616 kg)  The patient is asked to make an attempt to improve diet and exercise patterns to aid in medical management of this problem.  Weight trend reviewed  started Phentermine 01/2014 -taken x 3 mo with 5 lbs down Will continue ongoing "break" from med and continue lifestyle changes to further weight

## 2015-08-30 NOTE — Patient Instructions (Addendum)
It was good to see you today.  We have reviewed your prior records including labs and tests today  Declined the annual flu shot; please consider tetanus (or let us know when this is been done by your employer); other Health Maintenance reviewed - all recommended immunizations and age-appropriate screenings are up-to-date.  Test(s) ordered today. Your results will be released to [facility] (or called to you) after review, usually within 72hours after test completion. If any changes need to be made, you will be notified at that same time.  Medications reviewed and updated, no changes recommended at this time. Refill on medication(s) as discussed today.  Work on lifestyle changes as discussed (low fat, low carb, increased protein diet; improved exercise efforts; weight loss) to control sugar, blood pressure and cholesterol levels and/or reduce risk of developing other medical problems. Look into [email or web address].[email or web address] or other type of food journal to assist you in this process.  Please schedule followup in 6 months for semiannual exam and labs, call sooner if problems.  Health Maintenance, Female Adopting a healthy lifestyle and getting preventive care can go a long way to promote health and wellness. Talk with your health care provider about what schedule of regular examinations is right for you. This is a good chance for you to check in with your provider about disease prevention and staying healthy. In between checkups, there are plenty of things you can do on your own. Experts have done a lot of research about which lifestyle changes and preventive measures are most likely to keep you healthy. Ask your health care provider for more information. WEIGHT AND DIET  Eat a healthy diet  Be sure to include plenty of vegetables, fruits, low-fat dairy products, and lean protein.  Do not eat a lot of foods high in solid fats, added sugars, or salt.  Get regular exercise. This is one of the most important  things you can do for your health.  Most adults should exercise for at least 150 minutes each week. The exercise should increase your heart rate and make you sweat (moderate-intensity exercise).  Most adults should also do strengthening exercises at least twice a week. This is in addition to the moderate-intensity exercise.  Maintain a healthy weight  Body mass index (BMI) is a measurement that can be used to identify possible weight problems. It estimates body fat based on height and weight. Your health care provider can help determine your BMI and help you achieve or maintain a healthy weight.  For females 29 years of age and older:   A BMI below 18.5 is considered underweight.  A BMI of 18.5 to 24.9 is normal.  A BMI of 25 to 29.9 is considered overweight.  A BMI of 30 and above is considered obese.  Watch levels of cholesterol and blood lipids  You should start having your blood tested for lipids and cholesterol at 53 years of age, then have this test every 5 years.  You may need to have your cholesterol levels checked more often if:  Your lipid or cholesterol levels are high.  You are older than 53 years of age.  You are at high risk for heart disease.  CANCER SCREENING   Lung Cancer  Lung cancer screening is recommended for adults 50-62 years old who are at high risk for lung cancer because of a history of smoking.  A yearly low-dose CT scan of the lungs is recommended for people who:  Currently smoke.  Have quit within the past  15 years.  Have at least a 30-pack-year history of smoking. A pack year is smoking an average of one pack of cigarettes a day for 1 year.  Yearly screening should continue until it has been 15 years since you quit.  Yearly screening should stop if you develop a health problem that would prevent you from having lung cancer treatment.  Breast Cancer  Practice breast self-awareness. This means understanding how your breasts normally  appear and feel.  It also means doing regular breast self-exams. Let your health care provider know about any changes, no matter how small.  If you are in your 20s or 30s, you should have a clinical breast exam (CBE) by a health care provider every 1-3 years as part of a regular health exam.  If you are 38 or older, have a CBE every year. Also consider having a breast X-ray (mammogram) every year.  If you have a family history of breast cancer, talk to your health care provider about genetic screening.  If you are at high risk for breast cancer, talk to your health care provider about having an MRI and a mammogram every year.  Breast cancer gene (BRCA) assessment is recommended for women who have family members with BRCA-related cancers. BRCA-related cancers include:  Breast.  Ovarian.  Tubal.  Peritoneal cancers.  Results of the assessment will determine the need for genetic counseling and BRCA1 and BRCA2 testing. Cervical Cancer Your health care provider may recommend that you be screened regularly for cancer of the pelvic organs (ovaries, uterus, and vagina). This screening involves a pelvic examination, including checking for microscopic changes to the surface of your cervix (Pap test). You may be encouraged to have this screening done every 3 years, beginning at age 52.  For women ages 33-65, health care providers may recommend pelvic exams and Pap testing every 3 years, or they may recommend the Pap and pelvic exam, combined with testing for human papilloma virus (HPV), every 5 years. Some types of HPV increase your risk of cervical cancer. Testing for HPV may also be done on women of any age with unclear Pap test results.  Other health care providers may not recommend any screening for nonpregnant women who are considered low risk for pelvic cancer and who do not have symptoms. Ask your health care provider if a screening pelvic exam is right for you.  If you have had past  treatment for cervical cancer or a condition that could lead to cancer, you need Pap tests and screening for cancer for at least 20 years after your treatment. If Pap tests have been discontinued, your risk factors (such as having a new sexual partner) need to be reassessed to determine if screening should resume. Some women have medical problems that increase the chance of getting cervical cancer. In these cases, your health care provider may recommend more frequent screening and Pap tests. Colorectal Cancer  This type of cancer can be detected and often prevented.  Routine colorectal cancer screening usually begins at 53 years of age and continues through 53 years of age.  Your health care provider may recommend screening at an earlier age if you have risk factors for colon cancer.  Your health care provider may also recommend using home test kits to check for hidden blood in the stool.  A small camera at the end of a tube can be used to examine your colon directly (sigmoidoscopy or colonoscopy). This is done to check for the earliest forms of  colorectal cancer.  Routine screening usually begins at age 66.  Direct examination of the colon should be repeated every 5-10 years through 53 years of age. However, you may need to be screened more often if early forms of precancerous polyps or small growths are found. Skin Cancer  Check your skin from head to toe regularly.  Tell your health care provider about any new moles or changes in moles, especially if there is a change in a mole's shape or color.  Also tell your health care provider if you have a mole that is larger than the size of a pencil eraser.  Always use sunscreen. Apply sunscreen liberally and repeatedly throughout the day.  Protect yourself by wearing long sleeves, pants, a wide-brimmed hat, and sunglasses whenever you are outside. HEART DISEASE, DIABETES, AND HIGH BLOOD PRESSURE   High blood pressure causes heart disease and  increases the risk of stroke. High blood pressure is more likely to develop in:  People who have blood pressure in the high end of the normal range (130-139/85-89 mm Hg).  People who are overweight or obese.  People who are African American.  If you are 72-58 years of age, have your blood pressure checked every 3-5 years. If you are 83 years of age or older, have your blood pressure checked every year. You should have your blood pressure measured twice--once when you are at a hospital or clinic, and once when you are not at a hospital or clinic. Record the average of the two measurements. To check your blood pressure when you are not at a hospital or clinic, you can use:  An automated blood pressure machine at a pharmacy.  A home blood pressure monitor.  If you are between 91 years and 39 years old, ask your health care provider if you should take aspirin to prevent strokes.  Have regular diabetes screenings. This involves taking a blood sample to check your fasting blood sugar level.  If you are at a normal weight and have a low risk for diabetes, have this test once every three years after 53 years of age.  If you are overweight and have a high risk for diabetes, consider being tested at a younger age or more often. PREVENTING INFECTION  Hepatitis B  If you have a higher risk for hepatitis B, you should be screened for this virus. You are considered at high risk for hepatitis B if:  You were born in a country where hepatitis B is common. Ask your health care provider which countries are considered high risk.  Your parents were born in a high-risk country, and you have not been immunized against hepatitis B (hepatitis B vaccine).  You have HIV or AIDS.  You use needles to inject street drugs.  You live with someone who has hepatitis B.  You have had sex with someone who has hepatitis B.  You get hemodialysis treatment.  You take certain medicines for conditions, including  cancer, organ transplantation, and autoimmune conditions. Hepatitis C  Blood testing is recommended for:  Everyone born from [birth year] through 1964.  Anyone with known risk factors for hepatitis C. Sexually transmitted infections (STIs)  You should be screened for sexually transmitted infections (STIs) including gonorrhea and chlamydia if:  You are sexually active and are younger than 53 years of age.  You are older than 53 years of age and your health care provider tells you that you are at risk for this type of infection.  Your sexual  activity has changed since you were last screened and you are at an increased risk for chlamydia or gonorrhea. Ask your health care provider if you are at risk.  If you do not have HIV, but are at risk, it may be recommended that you take a prescription medicine daily to prevent HIV infection. This is called pre-exposure prophylaxis (PrEP). You are considered at risk if:  You are sexually active and do not regularly use condoms or know the HIV status of your partner(s).  You take drugs by injection.  You are sexually active with a partner who has HIV. Talk with your health care provider about whether you are at high risk of being infected with HIV. If you choose to begin PrEP, you should first be tested for HIV. You should then be tested every 3 months for as long as you are taking PrEP.  PREGNANCY   If you are premenopausal and you may become pregnant, ask your health care provider about preconception counseling.  If you may become pregnant, take 400 to 800 micrograms (mcg) of folic acid every day.  If you want to prevent pregnancy, talk to your health care provider about birth control (contraception). OSTEOPOROSIS AND MENOPAUSE   Osteoporosis is a disease in which the bones lose minerals and strength with aging. This can result in serious bone fractures. Your risk for osteoporosis can be identified using a bone density scan.  If you are 57 years of  age or older, or if you are at risk for osteoporosis and fractures, ask your health care provider if you should be screened.  Ask your health care provider whether you should take a calcium or vitamin D supplement to lower your risk for osteoporosis.  Menopause may have certain physical symptoms and risks.  Hormone replacement therapy may reduce some of these symptoms and risks. Talk to your health care provider about whether hormone replacement therapy is right for you.  HOME CARE INSTRUCTIONS   Schedule regular health, dental, and eye exams.  Stay current with your immunizations.   Do not use any tobacco products including cigarettes, chewing tobacco, or electronic cigarettes.  If you are pregnant, do not drink alcohol.  If you are breastfeeding, limit how much and how often you drink alcohol.  Limit alcohol intake to no more than 1 drink per day for nonpregnant women. One drink equals 12 ounces of beer, 5 ounces of wine, or 1 ounces of hard liquor.  Do not use street drugs.  Do not share needles.  Ask your health care provider for help if you need support or information about quitting drugs.  Tell your health care provider if you often feel depressed.  Tell your health care provider if you have ever been abused or do not feel safe at home.   This information is not intended to replace advice given to you by your health care provider. Make sure you discuss any questions you have with your health care provider.   Document Released: 03/20/2011 Document Revised: 09/25/2014 Document Reviewed: 08/06/2013 Elsevier Interactive Patient Education [employer].

## 2015-08-30 NOTE — Progress Notes (Signed)
Pre visit review using our clinic review tool, if applicable. No additional management support is needed unless otherwise documented below in the visit note. 

## 2015-08-30 NOTE — Addendum Note (Signed)
Addended by: Aviva Signs M on: 08/30/2015 11:16 AM   Modules accepted: Miquel Dunn

## 2015-08-30 NOTE — Assessment & Plan Note (Signed)
Never on rx meds for same - Taking flax seed and working on diet -  Check annually

## 2015-08-30 NOTE — Progress Notes (Signed)
Subjective:    Patient ID: [name] [name], female    DOB: 1961-05-28, 54 y.o.   MRN: [id number]  HPI  patient is here today for annual physical. Patient feels well in general. Also reviewed chronic medical conditions, interval events and current concerns   Past Medical History  Diagnosis Date  . OBESITY, TRUNCAL   . CONSTIPATION, CHRONIC   . Multiple sclerosis ([place])   . Allergic rhinitis due to other allergen   . HYPERTENSION   . HYPERLIPIDEMIA   . GERD   . Angioedema     ACEI  . Colon polyps 05/2014    adenomatous, colo q 5y  . Migraine headache   . Allergy   . Anxiety   . Breast cancer of upper-outer quadrant of right female breast ([place]) 10/23/2014    s/p r mastectomy, neoadj chemo completed 04/21/15; neg genetic testing  . Hot flashes    Family History  Problem Relation Age of Onset  . Coronary artery disease Mother   . Heart disease Mother   . Diabetes Mother   . Hypertension Other   . Hyperlipidemia Other   . Diabetes Other   . Colon cancer Neg Hx   . Alcohol abuse Father   . COPD Maternal Grandmother    Social History  Substance Use Topics  . Smoking status: Never Smoker   . Smokeless tobacco: Never Used  . Alcohol Use: No    Review of Systems  Constitutional: Negative for fatigue and unexpected weight change.  Respiratory: Negative for cough, shortness of breath and wheezing.   Cardiovascular: Negative for chest pain, palpitations and leg swelling.  Gastrointestinal: Negative for nausea, abdominal pain and diarrhea.  Neurological: Negative for dizziness, weakness, light-headedness and headaches.  Psychiatric/Behavioral: Negative for dysphoric mood. The patient is not nervous/anxious.   All other systems reviewed and are negative.      Objective:    Physical Exam  Constitutional: She is oriented to person, place, and time. She appears well-developed and well-nourished. No distress.  obese  HENT:  Head: Normocephalic and atraumatic.  Right Ear:  External ear normal.  Left Ear: External ear normal.  Nose: Nose normal.  Mouth/Throat: Oropharynx is clear and moist. No oropharyngeal exudate.  Eyes: EOM are normal. Pupils are equal, round, and reactive to light. Right eye exhibits no discharge. Left eye exhibits no discharge. No scleral icterus.  Neck: Normal range of motion. Neck supple. No JVD present. No tracheal deviation present. No thyromegaly present.  Cardiovascular: Normal rate, regular rhythm, normal heart sounds and intact distal pulses.  Exam reveals no friction rub.   No murmur heard. Pulmonary/Chest: Effort normal and breath sounds normal. No respiratory distress. She has no wheezes. She has no rales. She exhibits no tenderness.  Abdominal: Soft. Bowel sounds are normal. She exhibits no distension and no mass. There is no tenderness. There is no rebound and no guarding.  Musculoskeletal: Normal range of motion.  Lymphadenopathy:    She has no cervical adenopathy.  Neurological: She is alert and oriented to person, place, and time. She has normal reflexes. No cranial nerve deficit.  Skin: Skin is warm and dry. No rash noted. She is not diaphoretic. No erythema.  Psychiatric: She has a normal mood and affect. Her behavior is normal. Judgment and thought content normal.  Nursing note and vitals reviewed.   BP 120/70 mmHg  Pulse 74  Temp(Src) 98.2 F (36.8 C) (Oral)  Ht 5\' 5"  (1.651 m)  Wt 217 lb 8 oz (98.657  kg)  BMI 36.19 kg/m2  SpO2 99%  LMP 11/01/2012 Wt Readings from Last 3 Encounters:  08/30/15 217 lb 8 oz (98.657 kg)  07/05/15 214 lb 4 oz (97.183 kg)  06/29/15 213 lb (96.616 kg)     Lab Results  Component Value Date   WBC 4.5 06/15/2015   HGB 12.4 06/15/2015   HCT 37.0 06/15/2015   PLT 265 06/15/2015   GLUCOSE 115 06/15/2015   CHOL 196 01/14/2014   TRIG 206.0* 01/14/2014   HDL 45.50 01/14/2014   LDLDIRECT 122.7 11/06/2011   LDLCALC 109* 01/14/2014   ALT 29 06/15/2015   AST 23 06/15/2015   NA 143  06/15/2015   K 3.8 06/15/2015   CL 107 05/14/2015   CREATININE 0.8 06/15/2015   BUN 11.1 06/15/2015   CO2 26 06/15/2015   TSH 1.86 01/14/2014   HGBA1C 5.9 12/30/2007    US Venous Img Lower Unilateral Right  05/31/2015  CLINICAL DATA:  Right leg pain and swelling EXAM: RIGHT LOWER EXTREMITY VENOUS DOPPLER ULTRASOUND TECHNIQUE: Gray-scale sonography with graded compression, as well as color Doppler and duplex ultrasound were performed to evaluate the lower extremity deep venous systems from the level of the common femoral vein and including the common femoral, femoral, profunda femoral, popliteal and calf veins including the posterior tibial, peroneal and gastrocnemius veins when visible. The superficial great saphenous vein was also interrogated. Spectral Doppler was utilized to evaluate flow at rest and with distal augmentation maneuvers in the common femoral, femoral and popliteal veins. COMPARISON:  None. FINDINGS: Contralateral Common Femoral Vein: Respiratory phasicity is normal and symmetric with the symptomatic side. No evidence of thrombus. Normal compressibility. Common Femoral Vein: No evidence of thrombus. Normal compressibility, respiratory phasicity and response to augmentation. Saphenofemoral Junction: No evidence of thrombus. Normal compressibility and flow on color Doppler imaging. Profunda Femoral Vein: No evidence of thrombus. Normal compressibility and flow on color Doppler imaging. Femoral Vein: No evidence of thrombus. Normal compressibility, respiratory phasicity and response to augmentation. Popliteal Vein: No evidence of thrombus. Normal compressibility, respiratory phasicity and response to augmentation. Calf Veins: No evidence of thrombus. Normal compressibility and flow on color Doppler imaging. Superficial Great Saphenous Vein: No evidence of thrombus. Normal compressibility and flow on color Doppler imaging. Venous Reflux:  None. Other Findings:  None. IMPRESSION: No evidence  of deep venous thrombosis. Electronically Signed   By: Jerilynn Mages.  Shick M.D.   On: 05/31/2015 13:42       Assessment & Plan:   CPX/z00.00 - Patient has been counseled on age-appropriate routine health concerns for screening and prevention. These are reviewed and up-to-date. Immunizations are up-to-date or declined. Labs ordered and reviewed.  Problem List Items Addressed This Visit    Breast cancer of upper-outer quadrant of right female breast ([place])   Relevant Medications   ALPRAZolam (XANAX) 0.5 MG tablet   predniSONE (STERAPRED UNI-PAK 21 TAB) 10 MG (21) TBPK tablet   Dyslipidemia    Never on rx meds for same - Taking flax seed and working on diet -  Check annually      Multiple sclerosis ([place])    Dx 09/2003 - initially on capaxone Stable without flares for many years until fall 2013, then resumed medical maintenance treatment: copaxone  Mild RLE flare early 06/2013: numbness numbness consistent with prior flares per patient -s/p pred taper x 6 days and symptoms resolved; uses pred pak prn "mild flares" - refill same today  further eval ongoing by neuro as needed if recurring/severe symptoms  -  Dr Hyman Bower @ [facility] in [place]      Relevant Medications   Glatiramer Acetate (COPAXONE) 40 MG/ML SOSY   OBESITY, TRUNCAL     Wt Readings from Last 3 Encounters:  08/30/15 217 lb 8 oz (98.657 kg)  07/05/15 214 lb 4 oz (97.183 kg)  06/29/15 213 lb (96.616 kg)  The patient is asked to make an attempt to improve diet and exercise patterns to aid in medical management of this problem.  Weight trend reviewed  started Phentermine 01/2014 -taken x 3 mo with 5 lbs down Will continue ongoing "break" from med and continue lifestyle changes to further weight       Other Visit Diagnoses    Routine general medical examination at a health care facility    -  Primary    Relevant Orders    Basic metabolic panel    CBC with Differential/Platelet    Hepatic function panel    Lipid panel    TSH      Urinalysis, Routine w reflex microscopic (not at [facility])    Hepatitis C antibody    HIV antibody    Need for hepatitis C screening test        Relevant Orders    Hepatitis C antibody    Screening for HIV without presence of risk factors        Relevant Orders    HIV antibody        Gwendolyn Grant, MD

## 2015-08-31 LAB — HEPATITIS C ANTIBODY: HCV Ab: NEGATIVE

## 2015-08-31 LAB — HIV ANTIBODY (ROUTINE TESTING W REFLEX): HIV 1&2 Ab, 4th Generation: NONREACTIVE

## 2015-09-01 ENCOUNTER — Telehealth: Payer: Self-pay | Admitting: *Deleted

## 2015-09-01 MED ORDER — PREDNISONE 10 MG (21) PO TBPK: ORAL_TABLET | ORAL | Status: DC

## 2015-09-01 NOTE — Telephone Encounter (Signed)
[facility] needing to clarify prednisone dose pak that was sent on 12/12. Inform pharmacist rx actually should have went to [facility] to cancel rx and I will [facility] to [facility]...[name] [name]

## 2015-09-08 ENCOUNTER — Other Ambulatory Visit: Payer: Self-pay | Admitting: Nurse Practitioner

## 2015-09-09 ENCOUNTER — Other Ambulatory Visit: Payer: Self-pay | Admitting: Nurse Practitioner

## 2015-09-23 ENCOUNTER — Encounter: Payer: Self-pay | Admitting: Nurse Practitioner

## 2015-09-23 ENCOUNTER — Other Ambulatory Visit (HOSPITAL_BASED_OUTPATIENT_CLINIC_OR_DEPARTMENT_OTHER): Payer: BC Managed Care – PPO

## 2015-09-23 ENCOUNTER — Ambulatory Visit (HOSPITAL_BASED_OUTPATIENT_CLINIC_OR_DEPARTMENT_OTHER): Payer: BC Managed Care – PPO | Admitting: Nurse Practitioner

## 2015-09-23 ENCOUNTER — Telehealth: Payer: Self-pay | Admitting: Nurse Practitioner

## 2015-09-23 ENCOUNTER — Other Ambulatory Visit: Payer: Self-pay | Admitting: *Deleted

## 2015-09-23 VITALS — BP 136/73 | HR 86 | Temp 98.3°F | Resp 20 | Ht 65.0 in | Wt 219.3 lb

## 2015-09-23 DIAGNOSIS — C50411 Malignant neoplasm of upper-outer quadrant of right female breast: Secondary | ICD-10-CM | POA: Diagnosis not present

## 2015-09-23 DIAGNOSIS — G35 Multiple sclerosis: Secondary | ICD-10-CM

## 2015-09-23 DIAGNOSIS — T451X5A Adverse effect of antineoplastic and immunosuppressive drugs, initial encounter: Secondary | ICD-10-CM

## 2015-09-23 DIAGNOSIS — I10 Essential (primary) hypertension: Secondary | ICD-10-CM

## 2015-09-23 DIAGNOSIS — G620 Drug-induced polyneuropathy: Secondary | ICD-10-CM

## 2015-09-23 LAB — CBC WITH DIFFERENTIAL/PLATELET
BASO%: 0.2 % (ref 0.0–2.0)
BASOS ABS: 0 10*3/uL (ref 0.0–0.1)
EOS ABS: 0.2 10*3/uL (ref 0.0–0.5)
EOS%: 4.3 % (ref 0.0–7.0)
HCT: 37.4 % (ref 34.8–46.6)
HEMOGLOBIN: 12.8 g/dL (ref 11.6–15.9)
LYMPH%: 33.4 % (ref 14.0–49.7)
MCH: 29.4 pg (ref 25.1–34.0)
MCHC: 34.2 g/dL (ref 31.5–36.0)
MCV: 86 fL (ref 79.5–101.0)
MONO#: 0.4 10*3/uL (ref 0.1–0.9)
MONO%: 8.4 % (ref 0.0–14.0)
NEUT#: 2.2 10*3/uL (ref 1.5–6.5)
NEUT%: 53.7 % (ref 38.4–76.8)
Platelets: 219 10*3/uL (ref 145–400)
RBC: 4.35 10*6/uL (ref 3.70–5.45)
RDW: 15.9 % — AB (ref 11.2–14.5)
WBC: 4.2 10*3/uL (ref 3.9–10.3)
lymph#: 1.4 10*3/uL (ref 0.9–3.3)

## 2015-09-23 LAB — COMPREHENSIVE METABOLIC PANEL
ALBUMIN: 4 g/dL (ref 3.5–5.0)
ALK PHOS: 93 U/L (ref 40–150)
ALT: 23 U/L (ref 0–55)
AST: 22 U/L (ref 5–34)
Anion Gap: 9 mEq/L (ref 3–11)
BUN: 13.4 mg/dL (ref 7.0–26.0)
CHLORIDE: 107 meq/L (ref 98–109)
CO2: 25 mEq/L (ref 22–29)
Calcium: 9.6 mg/dL (ref 8.4–10.4)
Creatinine: 0.8 mg/dL (ref 0.6–1.1)
GLUCOSE: 99 mg/dL (ref 70–140)
POTASSIUM: 4 meq/L (ref 3.5–5.1)
SODIUM: 141 meq/L (ref 136–145)
Total Bilirubin: 0.84 mg/dL (ref 0.20–1.20)
Total Protein: 7.3 g/dL (ref 6.4–8.3)

## 2015-09-23 MED ORDER — GABAPENTIN 300 MG PO CAPS: 300.0000 mg | ORAL_CAPSULE | Freq: Every day | ORAL | Status: DC

## 2015-09-23 MED ORDER — GABAPENTIN 100 MG PO CAPS: 100.0000 mg | ORAL_CAPSULE | Freq: Three times a day (TID) | ORAL | Status: DC

## 2015-09-23 MED ORDER — METHYLPREDNISOLONE 4 MG PO TBPK: ORAL_TABLET | ORAL | Status: DC

## 2015-09-23 NOTE — Progress Notes (Signed)
[place]  [phone] [phone]     ID: [name] [name] DOB: [birth year]  MR#: [id number]  BSW#:[id number]  Patient Care Team: [name], MD as PCP - General [name], MD as Consulting Physician (Obstetrics and Gynecology) [name], MD (Gastroenterology) [name]. [name], MD (Neurology) [name], MD as Consulting Physician (General Surgery) [name], MD as Consulting Physician (Oncology) [name], MD as Attending Physician (Radiation Oncology) [name], RN as Registered Nurse [name], RN as Registered Nurse Sylvan Cheese, NP as Nurse Practitioner (Nurse Practitioner) OTHER MD:  CHIEF COMPLAINT: Triple negative breast cancer  CURRENT TREATMENT: Neoadjuvant chemotherapy  BREAST CANCER HISTORY: From the original intake note:  [name] [name] had bilateral screening mammography at the [facility] Center 11/21/2013. Breast density was category C. Exam was unremarkable. In January 2016 however the patient palpated a change in her right breast laterally. She contacted her gynecologist, Dr. [name], and he set her up for a unilateral right diagnostic mammography with right breast ultrasonography at [facility] 10/15/2014. There was a focal irregular opacity in the lateral right breast associated with pleomorphic calcifications. This was palpable on exam as a firm non-mobile mass. Ultrasound confirmed a hypoechoic mass in the right breast at the 9:00 position 5 cm from the nipple, measuring 2.9 cm. Next to this mass was a normal-sized intramammary lymph node measuring 4 mm. In the right axilla there were 3 contiguous level I lymph nodes the largest measuring 11 mm.  On 10/20/2014 the patient underwent right core needle biopsy of the 9:00 breast mass, showing ([id number]) and invasive ductal carcinoma, grade 3, triple negative, with the HER-2/neu signals ratio of 1.26 and number per cell 2.15. The MIB-1 was  71%. Biopsy of the largest right axillary lymph node was negative. Dr. Owens Shark the mammographer who performed a biopsy describes this is concordant.  Left breast mammography 10/26/2014 was negative, and bilateral breast MRI the same day confirmed, in the right breast, and upper outer quadrant mass measuring 2.5 cm, with a second mass measuring 1.4 cm slightly anterior and superior to it. In addition there was a total area of approximately 6.5 cm of non-masslike enhancement extending throughout the upper outer quadrant of the right breast. There was a cortically thickened right axillary lymph node which had been biopsied. There was also a mildly cortically thickened right subpectoral lymph node. There were no internal mammary or left axillary lymph nodes in the left breast was unremarkable.  The patient's subsequent history is as detailed below.  INTERVAL HISTORY: [name] [name] returns today for follow up of her breast cancer. The interval history since her last visit has been generally unremarkable. She is back at work and has good energy to perform throughout the day. Her hair is growing back.   REVIEW OF SYSTEMS: [name] [name] overall is doing well. 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 During the day she does not experience any. The tips of her fingers are very sensitive and even painful. She takes 27m gabapentin TID with no drowsiness. She was on a steroid taper that was helpful, even if it was for a short time. Otherwise a detailed review of systems is entirely negative.   PAST MEDICAL HISTORY: Past Medical History  Diagnosis Date  . OBESITY, TRUNCAL   . CONSTIPATION, CHRONIC   . Multiple sclerosis ([place]   . Allergic rhinitis due to other allergen   . HYPERTENSION   . HYPERLIPIDEMIA   .  GERD   . Angioedema     ACEI  . Colon polyps 05/2014    adenomatous, colo q 5y  . Migraine headache   . Allergy   . Anxiety   . Breast cancer of upper-outer quadrant of  right female breast ([name] [name]) 10/23/2014    s/p r mastectomy, neoadj chemo completed 04/21/15; neg genetic testing  . Hot flashes     PAST SURGICAL HISTORY: Past Surgical History  Procedure Laterality Date  . Tubal ligation    . Colonoscopy    . Portacath placement Left 11/06/2014    Procedure: INSERTION PORT-A-CATH;  Surgeon: [name], MD;  Location: [place];  Service: General;  Laterality: Left;  . Simple mastectomy with axillary sentinel node biopsy Right 05/13/2015    Procedure: RIGHT TOTAL  MASTECTOMY WITH RIGHT  AXILLARY SENTINEL NODE BIOPSY;  Surgeon: [name], MD;  Location: [facility];  Service: General;  Laterality: Right;  . Port-a-cath removal Left 05/13/2015    Procedure: REMOVAL PORT-A-CATH;  Surgeon: [name], MD;  Location: [facility];  Service: General;  Laterality: Left;    FAMILY HISTORY Family History  Problem Relation Age of Onset  . Coronary artery disease Mother   . Heart disease Mother   . Diabetes Mother   . Hypertension Other   . Hyperlipidemia Other   . Diabetes Other   . Colon cancer Neg Hx   . Alcohol abuse Father   . COPD Maternal Grandmother    the patient's father died at the age of 79 following his seizure. The patient's mother died from congestive 48 failure at the age of 28. The patient had 5 brothers and 2 sisters. One brother died from Escherichia coli infection, the other one had a history of seizure disorder. One sister died from causes unknown to the patient. There is no history of breast or ovarian cancer in the family.  GYNECOLOGIC HISTORY:  Patient's last menstrual period was 11/01/2012. Menarche age 55, first live birth age 32. The patient is GX P2. She stopped having periods in May 2014. She did not take hormone replacement. She did take her control pills remotely for approximately 4 years with no complications.  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 She  describes herself is single, and lives by herself. Her daughter [name] lives in [place] and works in Ambulance person for [employer]. Son Marne "[name]" Lemus is an Engineer, building services for [employer]: Not in place   HEALTH MAINTENANCE: Social History  Substance Use Topics  . Smoking status: Never Smoker   . Smokeless tobacco: Never Used  . Alcohol Use: No     Colonoscopy: September 2015/[name]  PAP: February 2015  Bone density:  Lipid panel:  Allergies  Allergen Reactions  . Ace Inhibitors Anaphylaxis     Angioedema (tongue swelling)  . Clarithromycin Anaphylaxis    Throat swells  . Levaquin [Levofloxacin] Other (See Comments)    Tendon pain - shoulder and calf    Current Outpatient Prescriptions  Medication Sig Dispense Refill  . ALPRAZolam (XANAX) 0.5 MG tablet Take 1 tablet (0.5 mg total) by mouth 2 (two) times daily as needed for anxiety or sleep. 30 tablet 1  . b complex vitamins tablet Take 1 tablet by mouth daily.    . ferrous gluconate (FERGON) 324 MG tablet Take 1 tablet (324 mg total) by mouth 2 (two) times daily with a meal. 180  tablet 3  . Glatiramer Acetate (COPAXONE) 40 MG/ML SOSY Inject 40 mg into the skin 3 (three) times a week. 12 Syringe 0  . hydrochlorothiazide (HYDRODIURIL) 25 MG tablet Take 1 tablet (25 mg total) by mouth daily. 90 tablet 3  . losartan (COZAAR) 50 MG tablet Take 1 tablet (50 mg total) by mouth 2 (two) times daily. 180 tablet 3  . metoprolol (LOPRESSOR) 50 MG tablet Take 1 tablet (50 mg total) by mouth 2 (two) times daily. 180 tablet 3  . pantoprazole (PROTONIX) 40 MG tablet Take 1 tablet (40 mg total) by mouth daily. 90 tablet 3  . potassium chloride (MICRO-K) 10 MEQ CR capsule Take 2 capsules (20 mEq total) by mouth 2 (two) times daily. 180 capsule 3  . predniSONE (STERAPRED UNI-PAK 21 TAB) 10 MG (21) TBPK tablet follow package directions 21 tablet 0  . acetaminophen (TYLENOL) 500 MG tablet  Take 1,000 mg by mouth 2 (two) times daily as needed (pain). Reported on 09/23/2015    . gabapentin (NEURONTIN) 100 MG capsule Take 1 capsule (100 mg total) by mouth 3 (three) times daily. Pt takes 2 capsules  three times daily 360 capsule 6  . loratadine-pseudoephedrine (CLARITIN-D 12 HOUR) 5-120 MG tablet Take 1 tablet by mouth daily as needed (seasonal allergies). (Patient not taking: Reported on 09/23/2015) 30 tablet 3  . [DISCONTINUED] mometasone (NASONEX) 50 MCG/ACT nasal spray Place 2 sprays into the nose daily. 17 g 2   No current facility-administered medications for this visit.    OBJECTIVE: Middle-aged African-American woman in no acute distress Filed Vitals:   09/23/15 0831  BP: 136/73  [name]: 86  Temp: 98.3 F (36.8 C)  Resp: 20     Body mass index is 36.49 kg/(m^2).    ECOG FS:1 - Symptomatic but completely ambulatory   Skin: warm, dry  HEENT: sclerae anicteric, conjunctivae pink, oropharynx clear. No thrush or mucositis.  Lymph Nodes: No cervical or supraclavicular lymphadenopathy  Lungs: clear to auscultation bilaterally, no rales, wheezes, or rhonci  Heart: regular rate and rhythm  Abdomen: round, soft, non tender, positive bowel sounds  Musculoskeletal: No focal spinal tenderness, no peripheral edema  Neuro: non focal, well oriented, positive affect  Breasts: right breast status post mastectomy. Incision line extends into right axilla. Tenderness with even light palpation. No evidence of chest wall recurrence. Right axilla benign. Left breast unremarkable.  LAB RESULTS:  CMP     Component Value Date/Time   NA 141 09/23/2015 0756   NA 143 08/30/2015 1034   K 4.0 09/23/2015 0756   K 4.0 08/30/2015 1034   CL 105 08/30/2015 1034   CO2 25 09/23/2015 0756   CO2 29 08/30/2015 1034   GLUCOSE 99 09/23/2015 0756   GLUCOSE 94 08/30/2015 1034   BUN 13.4 09/23/2015 0756   BUN 18 08/30/2015 1034   CREATININE 0.8 09/23/2015 0756   CREATININE 0.93 08/30/2015 1034   CALCIUM  9.6 09/23/2015 0756   CALCIUM 9.7 08/30/2015 1034   PROT 7.3 09/23/2015 0756   PROT 7.0 08/30/2015 1034   ALBUMIN 4.0 09/23/2015 0756   ALBUMIN 4.2 08/30/2015 1034   AST 22 09/23/2015 0756   AST 17 08/30/2015 1034   ALT 23 09/23/2015 0756   ALT 18 08/30/2015 1034   ALKPHOS 93 09/23/2015 0756   ALKPHOS 84 08/30/2015 1034   BILITOT 0.84 09/23/2015 0756   BILITOT 0.5 08/30/2015 1034   GFRNONAA >60 05/14/2015 0327   GFRAA >60 05/14/2015 0327    INo  results found for: SPEP, UPEP  Lab Results  Component Value Date   WBC 4.2 09/23/2015   NEUTROABS 2.2 09/23/2015   HGB 12.8 09/23/2015   HCT 37.4 09/23/2015   MCV 86.0 09/23/2015   PLT 219 09/23/2015      Chemistry      Component Value Date/Time   NA 141 09/23/2015 0756   NA 143 08/30/2015 1034   K 4.0 09/23/2015 0756   K 4.0 08/30/2015 1034   CL 105 08/30/2015 1034   CO2 25 09/23/2015 0756   CO2 29 08/30/2015 1034   BUN 13.4 09/23/2015 0756   BUN 18 08/30/2015 1034   CREATININE 0.8 09/23/2015 0756   CREATININE 0.93 08/30/2015 1034      Component Value Date/Time   CALCIUM 9.6 09/23/2015 0756   CALCIUM 9.7 08/30/2015 1034   ALKPHOS 93 09/23/2015 0756   ALKPHOS 84 08/30/2015 1034   AST 22 09/23/2015 0756   AST 17 08/30/2015 1034   ALT 23 09/23/2015 0756   ALT 18 08/30/2015 1034   BILITOT 0.84 09/23/2015 0756   BILITOT 0.5 08/30/2015 1034       No results found for: LABCA2  No components found for: HRCBU384  No results for input(s): INR in the last 168 hours.  Urinalysis    Component Value Date/Time   COLORURINE YELLOW 08/30/2015 [place] 08/30/2015 1034   LABSPEC 1.015 08/30/2015 1034   PHURINE 5.5 08/30/2015 1034   GLUCOSEU NEGATIVE 08/30/2015 1034   HGBUR NEGATIVE 08/30/2015 1034   HGBUR negative 08/19/2010 1041   BILIRUBINUR NEGATIVE 08/30/2015 1034   KETONESUR NEGATIVE 08/30/2015 1034   UROBILINOGEN 0.2 08/30/2015 1034   NITRITE NEGATIVE 08/30/2015 1034   LEUKOCYTESUR NEGATIVE  08/30/2015 1034    STUDIES: No results found.  ASSESSMENT: 55 y.o. [place] woman status post left breast upper outer quadrant biopsy 10/20/2014 for a clinical T2 N0, stage IIA invasive ductal carcinoma, grade 3, triple negative, with an MIB-1 of 71%  (a) right axillary lymph node biopsy negative 10/30/2014  (b) biopsy of posterior extent of right breast calcifications show ductal carcinoma is situ, high grade, with microinvasion  (1) genetics testing through the Breast High/Moderate Risk gene panel offered by [facility] found no deleterious mutations in ATM, BRCA1, BRCA2, CDH1, CHEK2, PALB2, PTEN, STK11, or TP53.   2) neoadjuvant chemotherapy consisting of cyclophosphamide and doxorubicin in dose dense fashion 4 given with Neulasta support, completed 01/06/2015, followed by weekly carboplatin and paclitaxel 12 completed 04/21/2015  (3) status post right mastectomy and sentinel lymph node sampling 05/13/2015 for a ypT1c ypN) invasive ductal carcinoma, high-grade, with ample margins.  (4) adjuvant radiation not necessary  PLAN: [name] [name] is in excellent spirits today. She is doing well since the completion of all of her treatments. The labs were reviewed in detail and were entirely stable.   Her main complaint today is residual neuropathy symptoms. She is on a very small dosage of gabapentin TID, so I suggested she increase to 394m QHS and 2033mtwice during the day. Steroid dose packs are temporarily relieving, so I have written for this as well, but she understands we have to use this particular intervention sparingly.   KaJuliann Pulseill have a repeat left mammogram in February. She will visit with Dr. WaDonne Hazeln April, and will return to see usKorean July. She understands and agrees with this plan. She has been encouraged to call with any issues that might arise before her next visit here.    HeNira Conn  Michel Bickers, NP   09/23/2015 9:20 AM

## 2015-09-23 NOTE — Telephone Encounter (Signed)
Appointments made and avs printed for pateint °

## 2015-09-28 ENCOUNTER — Other Ambulatory Visit: Payer: Self-pay | Admitting: *Deleted

## 2015-09-28 DIAGNOSIS — C50411 Malignant neoplasm of upper-outer quadrant of right female breast: Secondary | ICD-10-CM

## 2015-09-28 MED ORDER — GABAPENTIN 100 MG PO CAPS: ORAL_CAPSULE | ORAL | Status: DC

## 2015-10-28 ENCOUNTER — Other Ambulatory Visit: Payer: Self-pay | Admitting: Nurse Practitioner

## 2015-10-28 ENCOUNTER — Ambulatory Visit
Admission: RE | Admit: 2015-10-28 | Discharge: 2015-10-28 | Disposition: A | Discharge status: Home / Self Care | Payer: BC Managed Care – PPO | Source: Ambulatory Visit | Attending: Nurse Practitioner | Admitting: Nurse Practitioner

## 2015-10-28 DIAGNOSIS — C50411 Malignant neoplasm of upper-outer quadrant of right female breast: Secondary | ICD-10-CM

## 2015-10-29 ENCOUNTER — Encounter: Payer: Self-pay | Admitting: Oncology

## 2015-10-29 NOTE — Progress Notes (Signed)
form recd via fax. [name] wants call once done

## 2015-11-01 ENCOUNTER — Encounter: Payer: Self-pay | Admitting: Oncology

## 2015-11-01 NOTE — Progress Notes (Signed)
I placed forms for dr. Jana Hakim to sign fmla for tonya and wants call when done

## 2015-11-03 ENCOUNTER — Ambulatory Visit (INDEPENDENT_AMBULATORY_CARE_PROVIDER_SITE_OTHER): Payer: BC Managed Care – PPO | Admitting: Neurology

## 2015-11-03 ENCOUNTER — Encounter: Payer: Self-pay | Admitting: Neurology

## 2015-11-03 VITALS — BP 140/80 | HR 82 | Resp 20 | Ht 66.5 in | Wt 219.0 lb

## 2015-11-03 DIAGNOSIS — Z170 Estrogen receptor positive status [ER+]: Secondary | ICD-10-CM

## 2015-11-03 DIAGNOSIS — G35 Multiple sclerosis: Secondary | ICD-10-CM | POA: Diagnosis not present

## 2015-11-03 DIAGNOSIS — C50411 Malignant neoplasm of upper-outer quadrant of right female breast: Secondary | ICD-10-CM | POA: Diagnosis not present

## 2015-11-03 DIAGNOSIS — G35D Multiple sclerosis, unspecified: Secondary | ICD-10-CM

## 2015-11-03 HISTORY — DX: Multiple sclerosis: G35

## 2015-11-03 MED ORDER — GLATIRAMER ACETATE 40 MG/ML ~~LOC~~ SOSY: 40.0000 mg | PREFILLED_SYRINGE | SUBCUTANEOUS | Status: DC

## 2015-11-03 NOTE — Progress Notes (Signed)
Provider:  Larey Seat, M D  Referring Provider: [name], MD Primary Care Physician:  Gwendolyn Grant, MD  Chief Complaint  Patient presents with  . New Patient (Initial Visit)    has seen Hoyle Sauer, NP for the past visits, time for MD visit, MS, vision 20/20    HPI:  [name] [name] is a 55 y.o. female  Is seen here as a referral  from Dr. Asa Lente for MS care, headaches and re-establish care at [facility].  The patient has made this appointment by phone. Over the last 3 years , the patient had been followed by Dr. Doyle Askew , neurologist at [facility].  She has MRIs at [facility], and was in the meantime diagnosed with breast cancer.  She received the diagnosed on February 9th 2016 .  The patient had not been seen in our practice since Epic has been implemented. Her last visit was with Cecille Rubin on 10/30/2012, the last visit prior to that January  24th 2011.  She was on Copaxone and use it without injection site problems at the time she gets patient assistance through shared solutions prior to Copaxone she was on very brisk but could not tolerate the medication she had also been on Avonex and developed side effects she remains on Copaxone now. She has switched to Copaxone 3 times weekly injection over 3 years ago and she had no exacerbation or relapse in several years. Since she was diagnosed with breast cancer a year ago she has undergone chemotherapy and a right breast mastectomy.  She has no acute problems and wants to be followed again at [facility] for MS.  Review of Systems: Out of a complete 14 system review, the patient complains of only the following symptoms, and all other reviewed systems are negative.   Social History   Social History  . Marital Status: Single    Spouse Name: N/A  . Number of Children: 2  . Years of Education: 12   Occupational History  . [place] History Main Topics  . Smoking status: Never Smoker     . Smokeless tobacco: Never Used  . Alcohol Use: No  . Drug Use: No  . Sexual Activity: Not on file   Other Topics Concern  . 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    Patient is single and lives alone.    Patient is right handed.     Family History  Problem Relation Age of Onset  . Coronary artery disease Mother   . Heart disease Mother   . Diabetes Mother   . Hypertension Other   . Hyperlipidemia Other   . Diabetes Other   . Colon cancer Neg Hx   . Alcohol abuse Father   . COPD Maternal Grandmother     Past Medical History  Diagnosis Date  . OBESITY, TRUNCAL   . CONSTIPATION, CHRONIC   . Multiple sclerosis ([place])   . Allergic rhinitis due to other allergen   . HYPERTENSION   . HYPERLIPIDEMIA   . GERD   . Angioedema     ACEI  . Colon polyps 05/2014    adenomatous, colo q 5y  . Migraine headache   . Allergy   . Anxiety   . Breast cancer of upper-outer quadrant of right female breast ([place]) 10/23/2014    s/p r mastectomy, neoadj chemo  completed 04/21/15; neg genetic testing  . Hot flashes     Past Surgical History  Procedure Laterality Date  . Tubal ligation    . Colonoscopy    . Portacath placement Left 11/06/2014    Procedure: INSERTION PORT-A-CATH;  Surgeon: [name], MD;  Location: [place];  Service: General;  Laterality: Left;  . Simple mastectomy with axillary sentinel node biopsy Right 05/13/2015    Procedure: RIGHT TOTAL  MASTECTOMY WITH RIGHT  AXILLARY SENTINEL NODE BIOPSY;  Surgeon: [name], MD;  Location: [place];  Service: General;  Laterality: Right;  . Port-a-cath removal Left 05/13/2015    Procedure: REMOVAL PORT-A-CATH;  Surgeon: [name], MD;  Location: [place];  Service: General;  Laterality: Left;    Current Outpatient Prescriptions  Medication Sig Dispense Refill  . acetaminophen (TYLENOL) 500 MG tablet  Take 1,000 mg by mouth 2 (two) times daily as needed (pain). Reported on 09/23/2015    . ALPRAZolam (XANAX) 0.5 MG tablet Take 1 tablet (0.5 mg total) by mouth 2 (two) times daily as needed for anxiety or sleep. 30 tablet 1  . b complex vitamins tablet Take 1 tablet by mouth daily.    . ferrous gluconate (FERGON) 324 MG tablet Take 1 tablet (324 mg total) by mouth 2 (two) times daily with a meal. 180 tablet 3  . gabapentin (NEURONTIN) 100 MG capsule Patient to take 2 capsules twice a day 360 capsule 3  . gabapentin (NEURONTIN) 300 MG capsule Take 1 capsule (300 mg total) by mouth at bedtime. 90 capsule 3  . Glatiramer Acetate (COPAXONE) 40 MG/ML SOSY Inject 40 mg into the skin 3 (three) times a week. 12 Syringe 0  . hydrochlorothiazide (HYDRODIURIL) 25 MG tablet Take 1 tablet (25 mg total) by mouth daily. 90 tablet 3  . loratadine-pseudoephedrine (CLARITIN-D 12 HOUR) 5-120 MG tablet Take 1 tablet by mouth daily as needed (seasonal allergies). 30 tablet 3  . losartan (COZAAR) 50 MG tablet Take 1 tablet (50 mg total) by mouth 2 (two) times daily. 180 tablet 3  . metoprolol (LOPRESSOR) 50 MG tablet Take 1 tablet (50 mg total) by mouth 2 (two) times daily. 180 tablet 3  . pantoprazole (PROTONIX) 40 MG tablet Take 1 tablet (40 mg total) by mouth daily. 90 tablet 3  . potassium chloride (MICRO-K) 10 MEQ CR capsule Take 2 capsules (20 mEq total) by mouth 2 (two) times daily. 180 capsule 3  . [DISCONTINUED] mometasone (NASONEX) 50 MCG/ACT nasal spray Place 2 sprays into the nose daily. 17 g 2   No current facility-administered medications for this visit.    Allergies as of 11/03/2015 - Review Complete 11/03/2015  Allergen Reaction Noted  . Ace inhibitors Anaphylaxis 10/19/2008  . Clarithromycin Anaphylaxis   . Levaquin [levofloxacin] Other (See Comments) 08/27/2013    Vitals: BP 140/80 mmHg  Pulse 82  Resp 20  Ht 5' 6.5" (1.689 m)  Wt 219 lb (99.338 kg)  BMI 34.82 kg/m2  LMP 11/01/2012 Last  Weight:  Wt Readings from Last 1 Encounters:  11/03/15 219 lb (99.338 kg)   Last Height:   Ht Readings from Last 1 Encounters:  11/03/15 5' 6.5" (1.689 m)    Physical exam:  General: The patient is awake, alert and appears not in acute distress. The patient is well groomed. She is having hot flashes. Head: Normocephalic, atraumatic. Neck is supple.  Mallampati 3, neck circumference: 16.5  Cardiovascular:  Regular rate and rhythm , without  murmurs or  carotid bruit, and without distended neck veins. Respiratory: Lungs are clear to auscultation. Skin:  Without evidence of edema, or rash Trunk: BMI is elevated and patient  has normal posture.  Neurologic exam : The patient is awake and alert, oriented to place and time.  Memory subjective described as intact. There is a normal attention span & concentration ability. Speech is fluent without  dysarthria, dysphonia or aphasia. Mood and affect are appropriate.  Cranial nerves: Pupils are equal and briskly reactive to light. Funduscopic exam without  evidence of pallor or edema. Extraocular movements  in vertical and horizontal planes intact and without nystagmus.  Visual fields by finger perimetry are intact. Hearing to finger rub intact.  Facial sensation intact to fine touch. Facial motor strength is symmetric and tongue and uvula move midline. Tongue protrusion into either cheek is normal. Shoulder shrug is normal.   Motor exam:  Normal tone ,muscle bulk and symmetric  strength in all extremities.  Sensory:  Toes feel numb, and fingers present with pin and needle dysesthesia.  Fine touch, pinprick and vibration were tested in all extremities.  Proprioception was normal.  Coordination: Rapid alternating movements ,Finger-to-nose maneuver normal without evidence of ataxia, dysmetria or tremor.  Gait and station: Patient walks without assistive device and is able unassisted to climb up to the exam table.  Strength within normal limits.  Stance is stable and normal. Tandem gait is unfragmented. Romberg testing is negative   Deep tendon reflexes: in the  upper and lower extremities are symmetric and intact. Babinski maneuver response is  downgoing.   Assessment:  After physical and neurologic examination, review of laboratory studies, imaging, neurophysiology testing and pre-existing records, assessment is that of :  I have not had access to [facility] records, images and the the records reviewed for provided through her primary care physician Dr. Alba Cory. Her last white blood cell count was 4.5 on 06/15/2015 she had elevated triglycerides, elevated LDL, normal creatinine, normal electrolytes. BMI remains at 36.   MS , no relapses in over 6 years.   Plan:  Treatment plan and additional workup :  Patient had transferred care to [facility] and now needs pre authorization to continue Copaxone treatments. This medication running out is her reason to return to our practice , not a medical need.  I  will refill her medication.  If the patient doesn't follow up yearly with NP,  she will not be scheduled for more revisits, and I can not provide MS medications. Asencion Partridge Pinchas Reither MD 11/03/2015

## 2015-11-03 NOTE — Patient Instructions (Signed)
Glatiramer injection What is this medicine? GLATIRAMER (gla TIR a mer) helps to decrease the number of multiple sclerosis relapses in people with relapsing-remitting forms of the disease. The medicine does not cure multiple sclerosis. This medicine may be used for other purposes; ask your health care provider or pharmacist if you have questions. What should I tell my health care provider before I take this medicine? They need to know if you have any of these conditions: -immune system problems -infection -an unusual or allergic reaction to glatiramer, mannitol, other medicines, foods, dyes, or preservatives -pregnant or trying to get pregnant -breast-feeding How should I use this medicine? This medicine is for injection under the skin. You will be taught how to prepare and give this medicine. Use exactly as directed. Take your medicine at regular intervals. Do not take your medicine more often than directed. Do not stop taking except on your doctor's advice. It is important that you put your used needles and syringes in a special sharps container. Do not put them in a trash can. If you do not have a sharps container, call your pharmacist or healthcare provider to get one. Talk to your pediatrician regarding the use of this medicine in children. Special care may be needed. Overdosage: If you think you have taken too much of this medicine contact a poison control center or emergency room at once. NOTE: This medicine is only for you. Do not share this medicine with others. What if I miss a dose? If you miss a dose, take it as soon as you can. If it is almost time for your next dose, take only that dose. Do not take double or extra doses. What may interact with this medicine? Interactions are not expected. This list may not describe all possible interactions. Give your health care provider a list of all the medicines, herbs, non-prescription drugs, or dietary supplements you use. Also tell them if  you smoke, drink alcohol, or use illegal drugs. Some items may interact with your medicine. What should I watch for while using this medicine? Visit your doctor or health care professional for regular checks on your progress. What side effects may I notice from receiving this medicine? Side effects that you should report to your doctor or health care professional as soon as possible: -allergic reactions like skin rash, itching or hives, swelling of the face, lips, or tongue -chest pain or tightness -difficulty breathing -fever, chills, or any other sign of infection -rapid heartbeat or palpitations -severe pain at the injection site -swelling of the ankles Side effects that usually do not require medical attention (report to your doctor or health care professional if they continue or are bothersome): -anxiety -dizziness -drowsiness -flushing -joint aches -nausea, vomiting -pain, redness, itching, or irritation at the injection site -tremor -weakness This list may not describe all possible side effects. Call your doctor for medical advice about side effects. You may report side effects to FDA at 1-800-FDA-1088. Where should I keep my medicine? Keep out of the reach of children. Store in a refrigerator between 2 and 8 degrees C (36 and 46 degrees F). An unused prefilled syringe may be stored for up to 7 days between 15 and 30 degrees C (59 and 86 degrees F). Do not freeze. Protect from light. Throw away any unused diluted injection. Throw away any unused medicine after the expiration date. NOTE: This sheet is a summary. It may not cover all possible information. If you have questions about this medicine, talk to   your doctor, pharmacist, or health care provider.    2016, Elsevier/Gold Standard. (2008-08-18 10:21:43)  

## 2015-11-05 ENCOUNTER — Encounter: Payer: Self-pay | Admitting: Oncology

## 2015-11-05 NOTE — Progress Notes (Signed)
Per tonya she will have mom come by and pick up frm front desk. Do not fax

## 2015-11-16 IMAGING — CR DG CHEST 2V
2 series · 2 of 2 positions shown · non-contrast
Comparison: November 06, 2014

CLINICAL DATA: Recent diagnosis of breast carcinoma. Fever of
unknown origin.

EXAM:
CHEST  2 VIEW

[w chest pa]
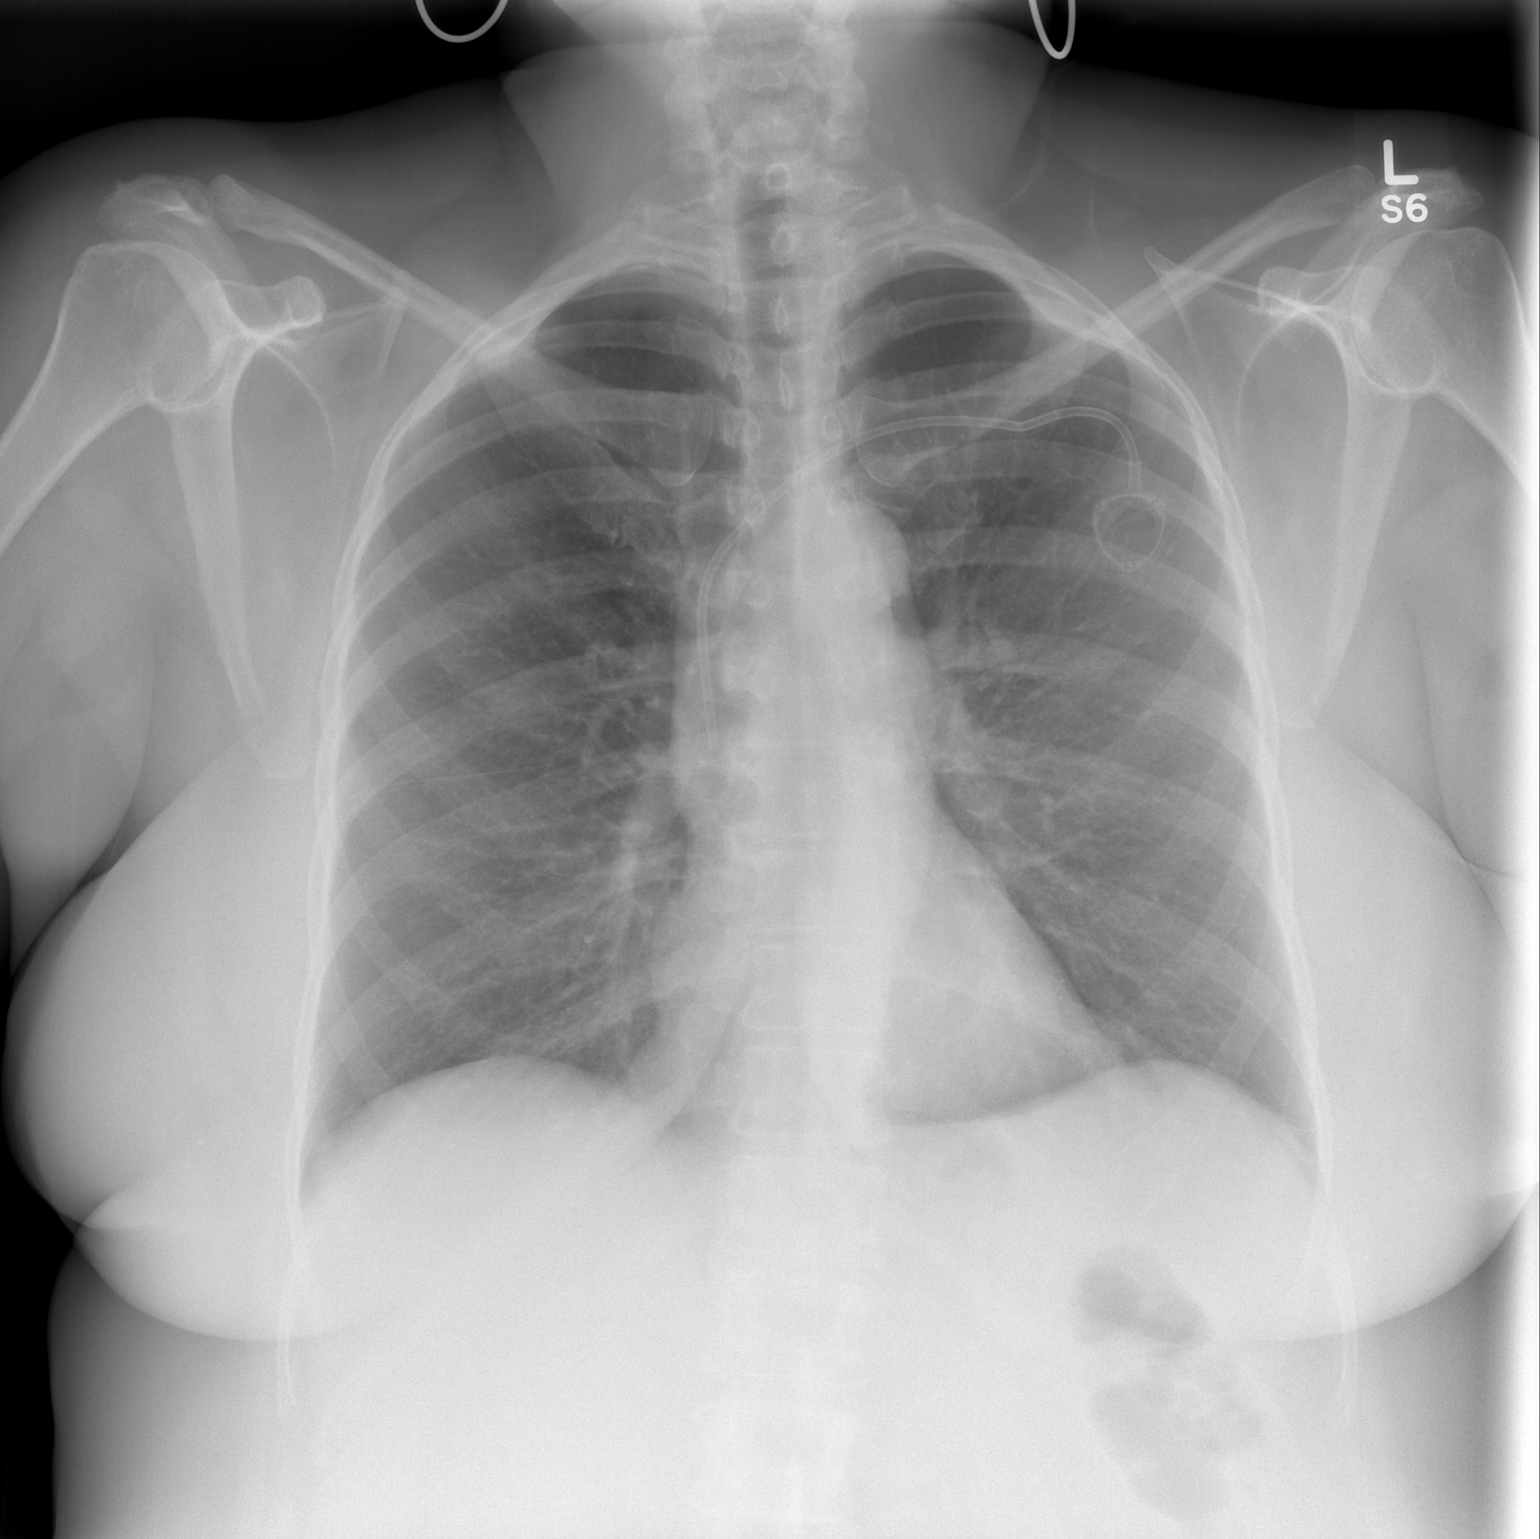

[w chest lat]
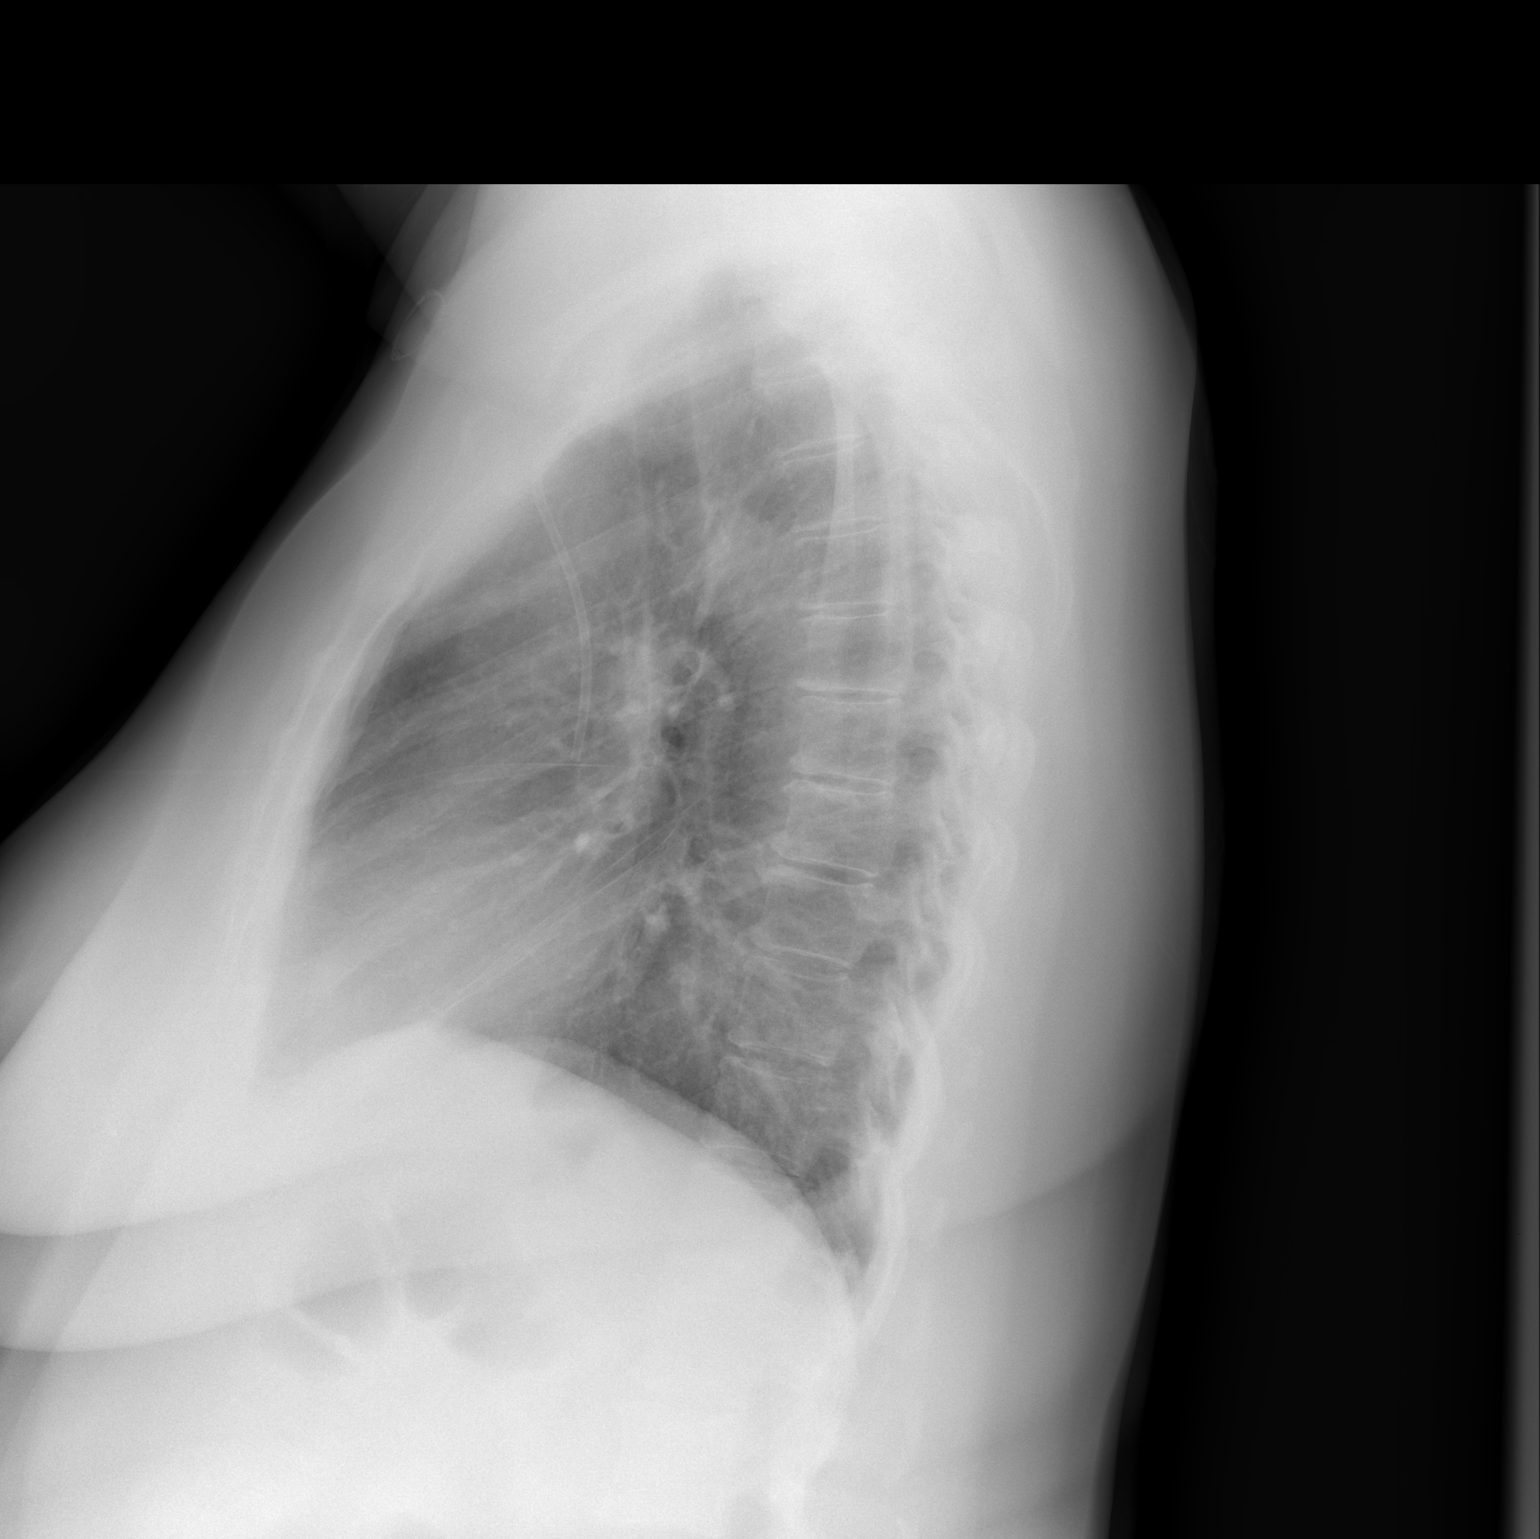

[2 of 2 positions shown; findings below may reference images not displayed]

FINDINGS: Port-A-Cath tip is in the superior vena cava. No pneumothorax. Lungs
are clear. Heart size and pulmonary vascularity are normal. No
adenopathy. No bone lesions.
IMPRESSION: No edema or consolidation.  No mass or adenopathy appreciable.

## 2015-12-08 ENCOUNTER — Telehealth: Payer: Self-pay | Admitting: Neurology

## 2015-12-08 DIAGNOSIS — C50411 Malignant neoplasm of upper-outer quadrant of right female breast: Secondary | ICD-10-CM

## 2015-12-08 MED ORDER — GLATIRAMER ACETATE 40 MG/ML ~~LOC~~ SOSY: 40.0000 mg | PREFILLED_SYRINGE | SUBCUTANEOUS | Status: DC

## 2015-12-08 NOTE — Telephone Encounter (Signed)
I called [facility] and spoke to a pharmacy representative. They claim that they do not have a copaxone refill on file for the pt. They advised me to fax a new RX to them. Will send the RX.  I called pt to advise her, no answer, left a message asking her to call me back. If pt calls back, please advise her that her RX for copaxone was faxed to [place].

## 2015-12-08 NOTE — Telephone Encounter (Signed)
Pt needs a rx Glatiramer Acetate (COPAXONE) 40 MG/ML SOSY sent to [facility]. She is no long using [facility]. May call [phone]

## 2015-12-15 ENCOUNTER — Ambulatory Visit (INDEPENDENT_AMBULATORY_CARE_PROVIDER_SITE_OTHER): Payer: BC Managed Care – PPO | Admitting: Nurse Practitioner

## 2015-12-15 ENCOUNTER — Encounter: Payer: Self-pay | Admitting: Nurse Practitioner

## 2015-12-15 VITALS — BP 122/68 | HR 97 | Temp 99.9°F | Wt 218.0 lb

## 2015-12-15 DIAGNOSIS — C50411 Malignant neoplasm of upper-outer quadrant of right female breast: Secondary | ICD-10-CM

## 2015-12-15 DIAGNOSIS — J309 Allergic rhinitis, unspecified: Secondary | ICD-10-CM

## 2015-12-15 MED ORDER — METHYLPREDNISOLONE 4 MG PO TBPK: ORAL_TABLET | ORAL | Status: DC

## 2015-12-15 MED ORDER — HYDROCOD POLST-CPM POLST ER 10-8 MG/5ML PO SUER: 5.0000 mL | Freq: Every evening | ORAL | Status: DC | PRN

## 2015-12-15 NOTE — Assessment & Plan Note (Signed)
Likely beginning to URI, but allergic rhinitis symptoms today with elevated temp.  Continue Claritin-D (didn't run up BP) Flonase at home  Cough syrup with instructions printed and sent with pt- cautioned about drowsy effects.  Prednisone taper sent to pharmacy- pt used before with success.  FU prn worsening/failure to improve.

## 2015-12-15 NOTE — Patient Instructions (Signed)
Upper Respiratory Infection, Adult Most upper respiratory infections (URIs) are a viral infection of the air passages leading to the lungs. A URI affects the nose, throat, and upper air passages. The most common type of URI is nasopharyngitis and is typically referred to as "the common cold." URIs run their course and usually go away on their own. Most of the time, a URI does not require medical attention, but sometimes a bacterial infection in the upper airways can follow a viral infection. This is called a secondary infection. Sinus and middle ear infections are common types of secondary upper respiratory infections. Bacterial pneumonia can also complicate a URI. A URI can worsen asthma and chronic obstructive pulmonary disease (COPD). Sometimes, these complications can require emergency medical care and may be life threatening.  CAUSES Almost all URIs are caused by viruses. A virus is a type of germ and can spread from one person to another.  RISKS FACTORS You may be at risk for a URI if:   You smoke.   You have chronic heart or lung disease.  You have a weakened defense (immune) system.   You are very young or very old.   You have nasal allergies or asthma.  You work in crowded or poorly ventilated areas.  You work in health care facilities or schools. SIGNS AND SYMPTOMS  Symptoms typically develop 2-3 days after you come in contact with a cold virus. Most viral URIs last 7-10 days. However, viral URIs from the influenza virus (flu virus) can last 14-18 days and are typically more severe. Symptoms may include:   Runny or stuffy (congested) nose.   Sneezing.   Cough.   Sore throat.   Headache.   Fatigue.   Fever.   Loss of appetite.   Pain in your forehead, behind your eyes, and over your cheekbones (sinus pain).  Muscle aches.  DIAGNOSIS  Your health care provider may diagnose a URI by:  Physical exam.  Tests to check that your symptoms are not due to  another condition such as:  Strep throat.  Sinusitis.  Pneumonia.  Asthma. TREATMENT  A URI goes away on its own with time. It cannot be cured with medicines, but medicines may be prescribed or recommended to relieve symptoms. Medicines may help:  Reduce your fever.  Reduce your cough.  Relieve nasal congestion. HOME CARE INSTRUCTIONS   Take medicines only as directed by your health care provider.   Gargle warm saltwater or take cough drops to comfort your throat as directed by your health care provider.  Use a warm mist humidifier or inhale steam from a shower to increase air moisture. This may make it easier to breathe.  Drink enough fluid to keep your urine clear or pale yellow.   Eat soups and other clear broths and maintain good nutrition.   Rest as needed.   Return to work when your temperature has returned to normal or as your health care provider advises. You may need to stay home longer to avoid infecting others. You can also use a face mask and careful hand washing to prevent spread of the virus.  Increase the usage of your inhaler if you have asthma.   Do not use any tobacco products, including cigarettes, chewing tobacco, or electronic cigarettes. If you need help quitting, ask your health care provider. PREVENTION  The best way to protect yourself from getting a cold is to practice good hygiene.   Avoid oral or hand contact with people with cold   symptoms.   Wash your hands often if contact occurs.  There is no clear evidence that vitamin C, vitamin E, echinacea, or exercise reduces the chance of developing a cold. However, it is always recommended to get plenty of rest, exercise, and practice good nutrition.  SEEK MEDICAL CARE IF:   You are getting worse rather than better.   Your symptoms are not controlled by medicine.   You have chills.  You have worsening shortness of breath.  You have brown or red mucus.  You have yellow or brown nasal  discharge.  You have pain in your face, especially when you bend forward.  You have a fever.  You have swollen neck glands.  You have pain while swallowing.  You have white areas in the back of your throat. SEEK IMMEDIATE MEDICAL CARE IF:   You have severe or persistent:  Headache.  Ear pain.  Sinus pain.  Chest pain.  You have chronic lung disease and any of the following:  Wheezing.  Prolonged cough.  Coughing up blood.  A change in your usual mucus.  You have a stiff neck.  You have changes in your:  Vision.  Hearing.  Thinking.  Mood. MAKE SURE YOU:   Understand these instructions.  Will watch your condition.  Will get help right away if you are not doing well or get worse.   This information is not intended to replace advice given to you by your health care provider. Make sure you discuss any questions you have with your health care provider.   Document Released: 02/28/2001 Document Revised: 01/19/2015 Document Reviewed: 12/10/2013 Elsevier Interactive Patient Education 2016 Elsevier Inc.  

## 2015-12-15 NOTE — Progress Notes (Signed)
Patient ID: [name] [name], female    DOB: 1961-03-17  Age: 55 y.o. MRN: [id number]  CC: No chief complaint on file.   HPI Andrew Cislo presents for CC of congestion and cough x 1 day.   1) Pt reports non-productive cough that started yesterday Pt reports nasal congestion with clear drainage TMax 100.7 Treatment to date: Tylenol and Cough drops  Sick contacts- Mendon has a past medical history of OBESITY, TRUNCAL; CONSTIPATION, CHRONIC; Multiple sclerosis ([employer]); Allergic rhinitis due to other allergen; HYPERTENSION; HYPERLIPIDEMIA; GERD; Angioedema; Colon polyps (05/2014); Migraine headache; Allergy; Anxiety; Breast cancer of upper-outer quadrant of right female breast ([place]) (10/23/2014); and Hot flashes.   She has past surgical history that includes Tubal ligation; Colonoscopy; Portacath placement (Left, 11/06/2014); Simple mastectomy with axillary sentinel node biopsy (Right, 05/13/2015); and Port-a-cath removal (Left, 05/13/2015).   Her family history includes Alcohol abuse in her father; COPD in her maternal grandmother; Coronary artery disease in her mother; Diabetes in her mother and other; Heart disease in her mother; Hyperlipidemia in her other; Hypertension in her other. There is no history of Colon cancer.She reports that she has never smoked. She has never used smokeless tobacco. She reports that she does not drink alcohol or use illicit drugs.  Outpatient Prescriptions Prior to Visit  Medication Sig Dispense Refill  . acetaminophen (TYLENOL) 500 MG tablet Take 1,000 mg by mouth 2 (two) times daily as needed (pain). Reported on 09/23/2015    . ALPRAZolam (XANAX) 0.5 MG tablet Take 1 tablet (0.5 mg total) by mouth 2 (two) times daily as needed for anxiety or sleep. 30 tablet 1  . b complex vitamins tablet Take 1 tablet by mouth daily.    . ferrous gluconate (FERGON) 324 MG tablet Take 1 tablet (324 mg total) by mouth 2 (two) times daily with a meal. 180  tablet 3  . gabapentin (NEURONTIN) 100 MG capsule Patient to take 2 capsules twice a day 360 capsule 3  . gabapentin (NEURONTIN) 300 MG capsule Take 1 capsule (300 mg total) by mouth at bedtime. 90 capsule 3  . Glatiramer Acetate (COPAXONE) 40 MG/ML SOSY Inject 40 mg into the skin 3 (three) times a week. 12 Syringe 5  . hydrochlorothiazide (HYDRODIURIL) 25 MG tablet Take 1 tablet (25 mg total) by mouth daily. 90 tablet 3  . loratadine-pseudoephedrine (CLARITIN-D 12 HOUR) 5-120 MG tablet Take 1 tablet by mouth daily as needed (seasonal allergies). 30 tablet 3  . losartan (COZAAR) 50 MG tablet Take 1 tablet (50 mg total) by mouth 2 (two) times daily. 180 tablet 3  . metoprolol (LOPRESSOR) 50 MG tablet Take 1 tablet (50 mg total) by mouth 2 (two) times daily. 180 tablet 3  . pantoprazole (PROTONIX) 40 MG tablet Take 1 tablet (40 mg total) by mouth daily. 90 tablet 3  . potassium chloride (MICRO-K) 10 MEQ CR capsule Take 2 capsules (20 mEq total) by mouth 2 (two) times daily. 180 capsule 3  . methylPREDNISolone (MEDROL DOSEPAK) 4 MG TBPK tablet Reported on 12/15/2015    . predniSONE (STERAPRED UNI-PAK 21 TAB) 10 MG (21) TBPK tablet Reported on 12/15/2015  0   No facility-administered medications prior to visit.    ROS Review of Systems  Constitutional: Positive for fatigue. Negative for fever, chills and diaphoresis.  HENT: Positive for congestion. Negative for ear pain, postnasal drip, rhinorrhea, sinus pressure, sneezing, sore throat and trouble swallowing.   Eyes: Negative for visual disturbance.  Respiratory: Positive for cough. Negative  for chest tightness, shortness of breath and wheezing.   Cardiovascular: Negative for chest pain, palpitations and leg swelling.  Gastrointestinal: Negative for nausea, vomiting and diarrhea.  Skin: Negative for rash.  Neurological: Negative for dizziness and headaches.    Objective:  BP 122/68 mmHg  Pulse 97  Temp(Src) 99.9 F (37.7 C)  Wt 218 lb  (98.884 kg)  SpO2 98%  LMP 11/01/2012  Physical Exam  Constitutional: She is oriented to person, place, and time. She appears well-developed and well-nourished. No distress.  HENT:  Head: Normocephalic and atraumatic.  Right Ear: External ear normal.  Left Ear: External ear normal.  Mouth/Throat: Oropharynx is clear and moist. No oropharyngeal exudate.  TMs clear bilaterally  Eyes: EOM are normal. Pupils are equal, round, and reactive to light. Right eye exhibits no discharge. Left eye exhibits no discharge. No scleral icterus.  Neck: Normal range of motion. Neck supple.  Cardiovascular: Normal rate and regular rhythm.   Pulmonary/Chest: Effort normal and breath sounds normal. No respiratory distress. She has no wheezes. She has no rales. She exhibits no tenderness.  Lymphadenopathy:    She has no cervical adenopathy.  Neurological: She is alert and oriented to person, place, and time.  Skin: Skin is warm and dry. No rash noted. She is not diaphoretic.  Psychiatric: She has a normal mood and affect. Her behavior is normal. Judgment and thought content normal.   Assessment & Plan:   Diagnoses and all orders for this visit:  Malignant neoplasm of upper-outer quadrant of right female breast (HCC) -     methylPREDNISolone (MEDROL DOSEPAK) 4 MG TBPK tablet; Take as directed  Allergic rhinitis, unspecified allergic rhinitis type  Other orders -     chlorpheniramine-HYDROcodone (TUSSIONEX PENNKINETIC ER) 10-8 MG/5ML SUER; Take 5 mLs by mouth at bedtime as needed for cough.   I have discontinued Ms. Bence's predniSONE. I have also changed her methylPREDNISolone. Additionally, I am having her start on chlorpheniramine-HYDROcodone. Lastly, I am having her maintain her b complex vitamins, acetaminophen, ferrous gluconate, ALPRAZolam, potassium chloride, pantoprazole, metoprolol, losartan, loratadine-pseudoephedrine, hydrochlorothiazide, gabapentin, gabapentin, and Glatiramer  Acetate.  Meds ordered this encounter  Medications  . methylPREDNISolone (MEDROL DOSEPAK) 4 MG TBPK tablet    Sig: Take as directed    Dispense:  21 tablet    Refill:  0    Order Specific Question:  Supervising Provider    Answer:  Deborra Medina L [[id number]]  . chlorpheniramine-HYDROcodone (TUSSIONEX PENNKINETIC ER) 10-8 MG/5ML SUER    Sig: Take 5 mLs by mouth at bedtime as needed for cough.    Dispense:  115 mL    Refill:  0    Order Specific Question:  Supervising Provider    Answer:  Crecencio Mc [[id number]]     Follow-up: Return if symptoms worsen or fail to improve.

## 2015-12-15 NOTE — Progress Notes (Signed)
Pre visit review using our clinic review tool, if applicable. No additional management support is needed unless otherwise documented below in the visit note. 

## 2015-12-21 ENCOUNTER — Telehealth: Payer: Self-pay | Admitting: *Deleted

## 2015-12-21 NOTE — Telephone Encounter (Signed)
Receive call pt states she is not any better. Ongoing cough congestion is settle in her chest not able to break up. What she is getting up is yellowish/greenish color. Completed med that was rx by [facility] pt is wondering if she can get antibiotic sent to her pharmacy and cough syrup...Johny Chess

## 2015-12-21 NOTE — Telephone Encounter (Signed)
Pls advise on msg below pt calling back for advisement...[name] [name]

## 2015-12-22 ENCOUNTER — Other Ambulatory Visit: Payer: Self-pay | Admitting: Nurse Practitioner

## 2015-12-22 MED ORDER — DOXYCYCLINE HYCLATE 100 MG PO TABS: 100.0000 mg | ORAL_TABLET | Freq: Two times a day (BID) | ORAL | Status: DC

## 2015-12-22 NOTE — Telephone Encounter (Signed)
[name] [name] this pt saw you last week. She called back yesterday requesting an antibiotic. See msg below...[name] [name]

## 2015-12-22 NOTE — Telephone Encounter (Signed)
I see I didn't do an antibiotic with her symptoms at last visit- I will call in a Z-pack to her pharmacy for care. If this is not helpful she will need to be re-evaluated.

## 2015-12-22 NOTE — Telephone Encounter (Signed)
Called pt no answer LMOM w/[name] response...[name] [name]

## 2015-12-22 NOTE — Telephone Encounter (Signed)
Sorry- Not Z-pack- has an allergy to that class- I will call in Doxycyline instead

## 2015-12-29 ENCOUNTER — Other Ambulatory Visit: Payer: Self-pay

## 2015-12-29 ENCOUNTER — Telehealth: Payer: Self-pay | Admitting: Internal Medicine

## 2015-12-29 MED ORDER — HYDROCHLOROTHIAZIDE 25 MG PO TABS: 25.0000 mg | ORAL_TABLET | Freq: Every day | ORAL | Status: DC

## 2015-12-29 MED ORDER — METOPROLOL TARTRATE 50 MG PO TABS: 50.0000 mg | ORAL_TABLET | Freq: Two times a day (BID) | ORAL | Status: DC

## 2015-12-29 NOTE — Telephone Encounter (Signed)
Patient called to request a refill of metoprolol (LOPRESSOR) 50 MG tablet [id number] . [facility]. Patient has an appt on 01/21/2016, but usually gets a 90 day supply

## 2015-12-29 NOTE — Telephone Encounter (Signed)
90 tabs sent to mail [facility]

## 2016-01-20 ENCOUNTER — Other Ambulatory Visit: Payer: Self-pay | Admitting: Obstetrics & Gynecology

## 2016-01-21 ENCOUNTER — Ambulatory Visit (INDEPENDENT_AMBULATORY_CARE_PROVIDER_SITE_OTHER): Payer: BC Managed Care – PPO | Admitting: Internal Medicine

## 2016-01-21 ENCOUNTER — Encounter: Payer: Self-pay | Admitting: Internal Medicine

## 2016-01-21 VITALS — BP 110/80 | HR 72 | Temp 98.7°F | Resp 16 | Wt 219.0 lb

## 2016-01-21 DIAGNOSIS — G35 Multiple sclerosis: Secondary | ICD-10-CM

## 2016-01-21 DIAGNOSIS — K219 Gastro-esophageal reflux disease without esophagitis: Secondary | ICD-10-CM

## 2016-01-21 DIAGNOSIS — E65 Localized adiposity: Secondary | ICD-10-CM

## 2016-01-21 DIAGNOSIS — I10 Essential (primary) hypertension: Secondary | ICD-10-CM

## 2016-01-21 DIAGNOSIS — K5909 Other constipation: Secondary | ICD-10-CM

## 2016-01-21 DIAGNOSIS — F419 Anxiety disorder, unspecified: Secondary | ICD-10-CM | POA: Diagnosis not present

## 2016-01-21 DIAGNOSIS — G620 Drug-induced polyneuropathy: Secondary | ICD-10-CM

## 2016-01-21 DIAGNOSIS — T451X5A Adverse effect of antineoplastic and immunosuppressive drugs, initial encounter: Secondary | ICD-10-CM

## 2016-01-21 MED ORDER — HYDROCHLOROTHIAZIDE 25 MG PO TABS: 25.0000 mg | ORAL_TABLET | Freq: Every day | ORAL | Status: DC

## 2016-01-21 MED ORDER — PANTOPRAZOLE SODIUM 40 MG PO TBEC: 40.0000 mg | DELAYED_RELEASE_TABLET | Freq: Every day | ORAL | Status: DC

## 2016-01-21 MED ORDER — METOPROLOL TARTRATE 50 MG PO TABS
50.0000 mg | ORAL_TABLET | Freq: Two times a day (BID) | ORAL | Status: DC
Start: 2016-01-21 — End: 2016-06-29

## 2016-01-21 MED ORDER — POTASSIUM CHLORIDE ER 10 MEQ PO CPCR: 20.0000 meq | ORAL_CAPSULE | Freq: Two times a day (BID) | ORAL | Status: DC

## 2016-01-21 MED ORDER — LOSARTAN POTASSIUM 50 MG PO TABS: 50.0000 mg | ORAL_TABLET | Freq: Two times a day (BID) | ORAL | Status: DC

## 2016-01-21 NOTE — Assessment & Plan Note (Signed)
Start metamucil or other supplemental fiber Trial of probiotics Start regular exercise

## 2016-01-21 NOTE — Assessment & Plan Note (Signed)
Wt Readings from Last 3 Encounters:  01/21/16 219 lb (99.338 kg)  12/15/15 218 lb (98.884 kg)  11/03/15 219 lb (99.338 kg)   She knows she needs to lose weight Is currently not exercising - [facility] Has worked on improving her diet Has been on phentermine in the past for 3 months (lost 5 lbs)

## 2016-01-21 NOTE — Patient Instructions (Addendum)
Try Align probiotics.  Make sure you are taking 1000 units of vitamin D.      No immunizations administered today.   Medications reviewed and updated.  No changes recommended at this time.  Your prescription(s) have been submitted to your pharmacy. Please take as directed and contact our office if you believe you are having problem(s) with the medication(s).   Please followup in 6 months for your physical.

## 2016-01-21 NOTE — Assessment & Plan Note (Addendum)
Occasional anxiety Xanax prn only - last took in 6 months ago

## 2016-01-21 NOTE — Assessment & Plan Note (Signed)
Taking gabapentin 200 mg twice a day -working well Will continue current dose

## 2016-01-21 NOTE — Progress Notes (Signed)
Subjective:    Patient ID: [name] [name], female    DOB: July 27, 1961, 55 y.o.   MRN: [id number]  HPI She is here to establish with a new pcp.  She is here for follow up.  Hypertension: She is taking her medication daily. She is compliant with a low sodium diet.  She denies chest pain, edema, shortness of breath and regular headaches. She is not exercising regularly now[facility] She knows she needs to lose weight.  She does monitor her blood pressure at home and it is well controlled.    GERD:  She is taking her medication daily as prescribed.  She denies any GERD symptoms and feels her GERD is well controlled.   Multiple sclerosis: It is currently in remission.  Her last flare was sept 2016.  She does her injections three times a week.  She is following with neurology.    Breast cancer: She was diagnosed February 2016.  She had chemo and then a mastectomy.  She has not needed any other treatment.  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  She thinks the gabapentin is working well and the neuropathy is getting better.   Anxiety:  She takes zanax as needed.  She last took it about 6 months. She only has anxiety on occasion.    Medications and allergies reviewed with patient and updated if appropriate.  Patient Active Problem List   Diagnosis Date Noted  . MS (multiple sclerosis) ([place]) 11/03/2015  . Malignant neoplasm of upper-outer quadrant of right female breast ([place]) 11/03/2015  . S/P mastectomy 05/13/2015  . 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  . 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 Genetic testing 12/11/2014  . Hypokalemia 12/10/2014  . Chemotherapy induced neutropenia ([place]) 11/26/2014  . Breast cancer of upper-outer quadrant of right female breast ([place]) 10/23/2014  . Anxiety 04/29/2012  . Allergic rhinitis 11/29/2009  . KELOID  11/05/2009  . CONSTIPATION, CHRONIC 08/17/2008  . Dyslipidemia 04/24/2008  . METABOLIC SYNDROME X [id number]  . GERD 01/07/2008  . OBESITY, TRUNCAL 09/20/2007  . Essential hypertension 09/20/2007  . Multiple sclerosis ([place]) 09/18/2007    Current Outpatient Prescriptions on File Prior to Visit  Medication Sig Dispense Refill  . acetaminophen (TYLENOL) 500 MG tablet Take 1,000 mg by mouth 2 (two) times daily as needed (pain). Reported on 09/23/2015    . ALPRAZolam (XANAX) 0.5 MG tablet Take 1 tablet (0.5 mg total) by mouth 2 (two) times daily as needed for anxiety or sleep. 30 tablet 1  . b complex vitamins tablet Take 1 tablet by mouth daily.    . ferrous gluconate (FERGON) 324 MG tablet Take 1 tablet (324 mg total) by mouth 2 (two) times daily with a meal. 180 tablet 3  . gabapentin (NEURONTIN) 100 MG capsule Patient to take 2 capsules twice a day 360 capsule 3  . Glatiramer Acetate (COPAXONE) 40 MG/ML SOSY Inject 40 mg into the skin 3 (three) times a week. 12 Syringe 5  . hydrochlorothiazide (HYDRODIURIL) 25 MG tablet Take 1 tablet (25 mg total) by mouth daily. 90 tablet 0  . loratadine-pseudoephedrine (CLARITIN-D 12 HOUR) 5-120 MG tablet Take 1 tablet by mouth daily as needed (seasonal allergies). 30 tablet 3  . losartan (COZAAR) 50 MG tablet Take 1 tablet (50 mg total) by mouth 2 (two) times daily. 180 tablet 3  . metoprolol (LOPRESSOR) 50 MG tablet Take 1  tablet (50 mg total) by mouth 2 (two) times daily. 90 tablet 0  . pantoprazole (PROTONIX) 40 MG tablet Take 1 tablet (40 mg total) by mouth daily. 90 tablet 3  . potassium chloride (MICRO-K) 10 MEQ CR capsule Take 2 capsules (20 mEq total) by mouth 2 (two) times daily. 180 capsule 3  . [DISCONTINUED] mometasone (NASONEX) 50 MCG/ACT nasal spray Place 2 sprays into the nose daily. 17 g 2   No current facility-administered medications on file prior to visit.    Past Medical History  Diagnosis Date  . OBESITY, TRUNCAL   . CONSTIPATION,  CHRONIC   . Multiple sclerosis ([place])   . Allergic rhinitis due to other allergen   . HYPERTENSION   . HYPERLIPIDEMIA   . GERD   . Angioedema     ACEI  . Colon polyps 05/2014    adenomatous, colo q 5y  . Migraine headache   . Allergy   . Anxiety   . Breast cancer of upper-outer quadrant of right female breast ([place]) 10/23/2014    s/p r mastectomy, neoadj chemo completed 04/21/15; neg genetic testing  . Hot flashes     Past Surgical History  Procedure Laterality Date  . Tubal ligation    . Colonoscopy    . Portacath placement Left 11/06/2014    Procedure: INSERTION PORT-A-CATH;  Surgeon: [name], MD;  Location: [facility];  Service: General;  Laterality: Left;  . Simple mastectomy with axillary sentinel node biopsy Right 05/13/2015    Procedure: RIGHT TOTAL  MASTECTOMY WITH RIGHT  AXILLARY SENTINEL NODE BIOPSY;  Surgeon: [name], MD;  Location: [facility];  Service: General;  Laterality: Right;  . Port-a-cath removal Left 05/13/2015    Procedure: REMOVAL PORT-A-CATH;  Surgeon: [name], MD;  Location: [facility];  Service: General;  Laterality: Left;    Social History   Social History  . Marital Status: Single    Spouse Name: N/A  . Number of Children: 2  . Years of Education: 12   Occupational History  . [place] History Main Topics  . Smoking status: Never Smoker   . Smokeless tobacco: Never Used  . Alcohol Use: No  . Drug Use: No  . Sexual Activity: Not Asked   Other Topics Concern  . 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    Patient is single and lives alone.    Patient is right handed.     Family History  Problem Relation Age of Onset  . Coronary artery disease Mother   . Heart disease Mother   . Diabetes Mother   . Hypertension Other   . Hyperlipidemia Other   . Diabetes Other   . Colon cancer  Neg Hx   . Alcohol abuse Father   . COPD Maternal Grandmother     Review of Systems  Constitutional: Negative for fever, chills and fatigue.       Hot flashes  Respiratory: Negative for cough, shortness of breath and wheezing.   Cardiovascular: Positive for palpitations. Negative for chest pain and leg swelling.  Gastrointestinal: Positive for constipation. Negative for abdominal pain and blood in stool.       No gerd  Neurological: Positive for headaches (occasional). Negative for dizziness and light-headedness.  Hematological: Bruises/bleeds easily.  Psychiatric/Behavioral: Negative for dysphoric mood. The patient is nervous/anxious (occasional).  Objective:   Filed Vitals:   01/21/16 0807  BP: 110/80  Pulse: 72  Temp: 98.7 F (37.1 C)  Resp: 16   Filed Weights   01/21/16 0807  Weight: 219 lb (99.338 kg)   Body mass index is 34.82 kg/(m^2).   Physical Exam Constitutional: Appears well-developed and well-nourished. No distress.  Neck: Neck supple. No tracheal deviation present. No thyromegaly present.  No carotid bruit. No cervical adenopathy.   Cardiovascular: Normal rate, regular rhythm and normal heart sounds.   No murmur heard.  No edema Pulmonary/Chest: Effort normal and breath sounds normal. No respiratory distress. No wheezes.  Abdomen: soft, obese, non-tender Psych: normal mood and affect    Assessment & Plan:   See Problem List for Assessment and Plan of chronic medical problems.  F/u 6 months PE

## 2016-01-21 NOTE — Assessment & Plan Note (Signed)
Last flare sept 2016, currently in remission Following with neurology

## 2016-01-21 NOTE — Progress Notes (Signed)
Pre visit review using our clinic review tool, if applicable. No additional management support is needed unless otherwise documented below in the visit note. 

## 2016-01-31 ENCOUNTER — Other Ambulatory Visit: Payer: Self-pay | Admitting: Emergency Medicine

## 2016-01-31 MED ORDER — FLUTICASONE PROPIONATE 50 MCG/ACT NA SUSP: 2.0000 | Freq: Every day | NASAL | Status: DC

## 2016-02-02 ENCOUNTER — Other Ambulatory Visit: Payer: Self-pay | Admitting: Neurology

## 2016-02-02 DIAGNOSIS — C50411 Malignant neoplasm of upper-outer quadrant of right female breast: Secondary | ICD-10-CM

## 2016-02-02 NOTE — Telephone Encounter (Signed)
PA completed and faxed to [facility] for copaxone. Should hear a determination in 3-5 business days.

## 2016-02-02 NOTE — Telephone Encounter (Signed)
[facility] [phone] called sts there will be a delay Glatiramer Acetate (COPAXONE) 40 MG/ML SOSY because PA is needed. Operator confirmed fax # of [phone].

## 2016-02-03 MED ORDER — GLATIRAMER ACETATE 40 MG/ML ~~LOC~~ SOSY: 40.0000 mg | PREFILLED_SYRINGE | SUBCUTANEOUS | Status: DC

## 2016-02-03 NOTE — Addendum Note (Signed)
Addended by: Lester Goodman A on: 02/03/2016 10:20 AM   Modules accepted: Orders

## 2016-02-03 NOTE — Telephone Encounter (Signed)
PA for copaxone approved by [facility] from 02/02/2016 to 02/02/19-2019. PA# [id number].  I spoke to pt and advised her of this. Pt is asking for a 3 month supply of copaxone.

## 2016-02-03 NOTE — Telephone Encounter (Signed)
Copaxone RX faxed to [facility]. Received a receipt of confirmation.

## 2016-02-04 ENCOUNTER — Encounter: Payer: Self-pay | Admitting: Oncology

## 2016-02-04 NOTE — Progress Notes (Signed)
fmla form from [place], I sent to medical records

## 2016-02-21 ENCOUNTER — Other Ambulatory Visit: Payer: Self-pay | Admitting: Internal Medicine

## 2016-03-10 ENCOUNTER — Encounter (HOSPITAL_COMMUNITY): Payer: Self-pay | Admitting: Emergency Medicine

## 2016-03-10 ENCOUNTER — Ambulatory Visit (HOSPITAL_COMMUNITY)
Admission: EM | Admit: 2016-03-10 | Discharge: 2016-03-10 | Disposition: A | Discharge status: Home / Self Care | Payer: No Typology Code available for payment source | Attending: Family Medicine | Admitting: Family Medicine

## 2016-03-10 DIAGNOSIS — Z041 Encounter for examination and observation following transport accident: Secondary | ICD-10-CM
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
## 2016-03-10 NOTE — Discharge Instructions (Signed)
Ice and advil for soreness as needed, return as needed

## 2016-03-17 ENCOUNTER — Other Ambulatory Visit: Payer: Self-pay | Admitting: Internal Medicine

## 2016-03-24 ENCOUNTER — Other Ambulatory Visit: Payer: Self-pay | Admitting: *Deleted

## 2016-03-24 DIAGNOSIS — C50411 Malignant neoplasm of upper-outer quadrant of right female breast: Secondary | ICD-10-CM

## 2016-03-26 NOTE — Progress Notes (Signed)
[name] [name]  [phone] Fax:(336) [phone]     ID: [name] [name] DOB: 12-31-1960  MR#: [id number]  KYH#:[id number]  Patient Care Team: Binnie Rail, MD as PCP - General (Internal Medicine) [name], MD as Consulting Physician (Obstetrics and Gynecology) [name], MD (Gastroenterology) [name]. [name], MD (Neurology) [name], MD as Consulting Physician (General Surgery) [name], MD as Consulting Physician (Oncology) [name], MD as Attending Physician (Radiation Oncology) [name], RN as Registered Nurse [name], RN as Registered Nurse Sylvan Cheese, NP as Nurse Practitioner (Nurse Practitioner) OTHER MD:  CHIEF COMPLAINT: Triple negative breast cancer  CURRENT TREATMENT: Observation  BREAST CANCER HISTORY: From the original intake note:  [name] [name] had bilateral screening mammography at the [facility] Center 11/21/2013. Breast density was category C. Exam was unremarkable. In January 2016 however the patient palpated a change in her right breast laterally. She contacted her gynecologist, Dr. [name], and he set her up for a unilateral right diagnostic mammography with right breast ultrasonography at [facility] 10/15/2014. There was a focal irregular opacity in the lateral right breast associated with pleomorphic calcifications. This was palpable on exam as a firm non-mobile mass. Ultrasound confirmed a hypoechoic mass in the right breast at the 9:00 position 5 cm from the nipple, measuring 2.9 cm. Next to this mass was a normal-sized intramammary lymph node measuring 4 mm. In the right axilla there were 3 contiguous level I lymph nodes the largest measuring 11 mm.  On 10/20/2014 the patient underwent right core needle biopsy of the 9:00 breast mass, showing ([id number]) and invasive ductal carcinoma, grade 3, triple negative, with the HER-2/neu signals ratio of 1.26 and number per cell 2.15. The MIB-1  was 71%. Biopsy of the largest right axillary lymph node was negative. Dr. Owens Shark the mammographer who performed a biopsy describes this is concordant.  Left breast mammography 10/26/2014 was negative, and bilateral breast MRI the same day confirmed, in the right breast, and upper outer quadrant mass measuring 2.5 cm, with a second mass measuring 1.4 cm slightly anterior and superior to it. In addition there was a total area of approximately 6.5 cm of non-masslike enhancement extending throughout the upper outer quadrant of the right breast. There was a cortically thickened right axillary lymph node which had been biopsied. There was also a mildly cortically thickened right subpectoral lymph node. There were no internal mammary or left axillary lymph nodes in the left breast was unremarkable.  The patient's subsequent history is as detailed below.  INTERVAL HISTORY: [name] [name] returns today for follow up of her triple negative breast cancer. She is not aware of any symptoms that would make her think her cancer might be back. She continues to work full-time. When asked what her worst problem is she said "I'm hungry".  REVIEW OF SYSTEMS: [name] [name] she has mild sinus problems. She does have some hot flashes. She is doing a boot camp twice a week at her church. She was constipated from her iron supplementation but now is taking a stool softeners as well as taking care of that problem. A detailed review of systems today was otherwise noncontributory  PAST MEDICAL HISTORY: Past Medical History  Diagnosis Date  . OBESITY, TRUNCAL   . CONSTIPATION, CHRONIC   . Multiple sclerosis ([place])   . Allergic rhinitis due to other allergen   . HYPERTENSION   . HYPERLIPIDEMIA   . GERD   . Angioedema     ACEI  .  Colon polyps 05/2014    adenomatous, colo q 5y  . Migraine headache   . Allergy   . Anxiety   . Breast cancer of upper-outer quadrant of right female breast ([place]) 10/23/2014    s/p r mastectomy, neoadj chemo  completed 04/21/15; neg genetic testing  . Hot flashes     PAST SURGICAL HISTORY: Past Surgical History  Procedure Laterality Date  . Tubal ligation    . Colonoscopy    . Portacath placement Left 11/06/2014    Procedure: INSERTION PORT-A-CATH;  Surgeon: [name], MD;  Location: [place];  Service: General;  Laterality: Left;  . Simple mastectomy with axillary sentinel node biopsy Right 05/13/2015    Procedure: RIGHT TOTAL  MASTECTOMY WITH RIGHT  AXILLARY SENTINEL NODE BIOPSY;  Surgeon: [name], MD;  Location: [place];  Service: General;  Laterality: Right;  . Port-a-cath removal Left 05/13/2015    Procedure: REMOVAL PORT-A-CATH;  Surgeon: [name], MD;  Location: [facility];  Service: General;  Laterality: Left;    FAMILY HISTORY Family History  Problem Relation Age of Onset  . Coronary artery disease Mother   . Heart disease Mother   . Diabetes Mother   . Hypertension Other   . Hyperlipidemia Other   . Diabetes Other   . Colon cancer Neg Hx   . Alcohol abuse Father   . COPD Maternal Grandmother    the patient's father died at the age of 7 following his seizure. The patient's mother died from congestive 68 failure at the age of 66. The patient had 5 brothers and 2 sisters. One brother died from Escherichia coli infection, the other one had a history of seizure disorder. One sister died from causes unknown to the patient. There is no history of breast or ovarian cancer in the family.  GYNECOLOGIC HISTORY:  Patient's last menstrual period was 11/01/2012. Menarche age 77, first live birth age 6. The patient is GX P2. She stopped having periods in May 2014. She did not take hormone replacement. She did take her control pills remotely for approximately 4 years with no complications.  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 Her daughter [name] [name] lives in [place] and works in Ambulance person for [employer]. Son [name] [name] is an Engineer, building services for [employer]. The patient has 1 grand son who graduated from high school June 2017    ADVANCED DIRECTIVES: Not in place   HEALTH MAINTENANCE: Social History  Substance Use Topics  . Smoking status: Never Smoker   . Smokeless tobacco: Never Used  . Alcohol Use: No     Colonoscopy: September 2015/[name]  PAP: February 2015  Bone density:  Lipid panel:  Allergies  Allergen Reactions  . Ace Inhibitors Anaphylaxis     Angioedema (tongue swelling)  . Clarithromycin Anaphylaxis    Throat swells  . Levaquin [Levofloxacin] Other (See Comments)    Tendon pain - shoulder and calf    Current Outpatient Prescriptions  Medication Sig Dispense Refill  . acetaminophen (TYLENOL) 500 MG tablet Take 1,000 mg by mouth 2 (two) times daily as needed (pain). Reported on 09/23/2015    . ALPRAZolam (XANAX) 0.5 MG tablet Take 1 tablet (0.5 mg total) by mouth 2 (two) times daily as needed for anxiety or sleep. 30 tablet 1  . b complex vitamins tablet Take 1 tablet by mouth daily.    [name]  ferrous gluconate (FERGON) 324 MG tablet Take 1 tablet (324 mg total) by mouth 2 (two) times daily with a meal. 180 tablet 3  . fluticasone (FLONASE) 50 MCG/ACT nasal spray Place 2 sprays into both nostrils daily. 16 g 6  . gabapentin (NEURONTIN) 100 MG capsule Patient to take 2 capsules twice a day 360 capsule 3  . Glatiramer Acetate (COPAXONE) 40 MG/ML SOSY Inject 40 mg into the skin 3 (three) times a week. 36 Syringe 2  . hydrochlorothiazide (HYDRODIURIL) 25 MG tablet TAKE 1 TABLET DAILY 90 tablet 2  . loratadine-pseudoephedrine (CLARITIN-D 12 HOUR) 5-120 MG tablet Take 1 tablet by mouth daily as needed (seasonal allergies). 30 tablet 3  . losartan (COZAAR) 50 MG tablet Take 1 tablet (50 mg total) by mouth 2 (two) times daily. 180 tablet 3  . metoprolol (LOPRESSOR) 50 MG tablet Take  1 tablet (50 mg total) by mouth 2 (two) times daily. 180 tablet 3  . pantoprazole (PROTONIX) 40 MG tablet Take 1 tablet (40 mg total) by mouth daily. 90 tablet 3  . potassium chloride (MICRO-K) 10 MEQ CR capsule Take 2 capsules (20 mEq total) by mouth 2 (two) times daily. 180 capsule 3  . [DISCONTINUED] mometasone (NASONEX) 50 MCG/ACT nasal spray Place 2 sprays into the nose daily. 17 g 2   No current facility-administered medications for this visit.    OBJECTIVE: Middle-aged African-American woman who appears well Filed Vitals:   03/27/16 1405  BP: 132/82  [name]: 78  Temp: 98.6 F (37 C)  Resp: 18     Body mass index is 34.93 kg/(m^2).    ECOG FS:0 - Asymptomatic   Sclerae unicteric, pupils round and equal Oropharynx clear and moist-- no thrush or other lesions No cervical or supraclavicular adenopathy Lungs no rales or rhonchi Heart regular rate and rhythm Abd soft, obese, nontender, positive bowel sounds MSK no focal spinal tenderness, no upper extremity lymphedema Neuro: nonfocal, well oriented, appropriate affect Breasts: The right breast is status post mastectomy. There is no evidence of local recurrence. The right axilla is benign. The left breast is unremarkable   LAB RESULTS:  CMP     Component Value Date/Time   NA 142 03/27/2016 1314   NA 143 08/30/2015 1034   K 4.2 03/27/2016 1314   K 4.0 08/30/2015 1034   CL 105 08/30/2015 1034   CO2 27 03/27/2016 1314   CO2 29 08/30/2015 1034   GLUCOSE 91 03/27/2016 1314   GLUCOSE 94 08/30/2015 1034   BUN 11.6 03/27/2016 1314   BUN 18 08/30/2015 1034   CREATININE 1.0 03/27/2016 1314   CREATININE 0.93 08/30/2015 1034   CALCIUM 10.1 03/27/2016 1314   CALCIUM 9.7 08/30/2015 1034   PROT 7.9 03/27/2016 1314   PROT 7.0 08/30/2015 1034   ALBUMIN 4.2 03/27/2016 1314   ALBUMIN 4.2 08/30/2015 1034   AST 22 03/27/2016 1314   AST 17 08/30/2015 1034   ALT 30 03/27/2016 1314   ALT 18 08/30/2015 1034   ALKPHOS 108 03/27/2016  1314   ALKPHOS 84 08/30/2015 1034   BILITOT 0.44 03/27/2016 1314   BILITOT 0.5 08/30/2015 1034   GFRNONAA >60 05/14/2015 0327   GFRAA >60 05/14/2015 0327    INo results found for: SPEP, UPEP  Lab Results  Component Value Date   WBC 6.0 03/27/2016   NEUTROABS 2.7 03/27/2016   HGB 12.8 03/27/2016   HCT 38.5 03/27/2016   MCV 89.1 03/27/2016   PLT 247 03/27/2016  Chemistry      Component Value Date/Time   NA 142 03/27/2016 1314   NA 143 08/30/2015 1034   K 4.2 03/27/2016 1314   K 4.0 08/30/2015 1034   CL 105 08/30/2015 1034   CO2 27 03/27/2016 1314   CO2 29 08/30/2015 1034   BUN 11.6 03/27/2016 1314   BUN 18 08/30/2015 1034   CREATININE 1.0 03/27/2016 1314   CREATININE 0.93 08/30/2015 1034      Component Value Date/Time   CALCIUM 10.1 03/27/2016 1314   CALCIUM 9.7 08/30/2015 1034   ALKPHOS 108 03/27/2016 1314   ALKPHOS 84 08/30/2015 1034   AST 22 03/27/2016 1314   AST 17 08/30/2015 1034   ALT 30 03/27/2016 1314   ALT 18 08/30/2015 1034   BILITOT 0.44 03/27/2016 1314   BILITOT 0.5 08/30/2015 1034       No results found for: LABCA2  No components found for: LABCA125  No results for input(s): INR in the last 168 hours.  Urinalysis    Component Value Date/Time   COLORURINE YELLOW 08/30/2015 [place] 08/30/2015 1034   LABSPEC 1.015 08/30/2015 1034   PHURINE 5.5 08/30/2015 1034   GLUCOSEU NEGATIVE 08/30/2015 1034   HGBUR NEGATIVE 08/30/2015 1034   HGBUR negative 08/19/2010 1041   BILIRUBINUR NEGATIVE 08/30/2015 1034   KETONESUR NEGATIVE 08/30/2015 1034   UROBILINOGEN 0.2 08/30/2015 1034   NITRITE NEGATIVE 08/30/2015 1034   LEUKOCYTESUR NEGATIVE 08/30/2015 1034    STUDIES: CLINICAL DATA: Screening.  EXAM: DIGITAL SCREENING UNILATERAL LEFT MAMMOGRAM WITH CAD  COMPARISON: Previous exam(s).  ACR Breast Density Category d: The breast tissue is extremely dense, which lowers the sensitivity of mammography.  FINDINGS: The  patient has had a right mastectomy. There are no findings suspicious for malignancy.  Images were processed with CAD.  IMPRESSION: No mammographic evidence of malignancy. A result letter of this screening mammogram will be mailed directly to the patient.  RECOMMENDATION: Screening mammogram in one year. (Code:[id number]-[id number])  BI-RADS CATEGORY 1: Negative.   Electronically Signed  By: Claudie Revering M.D.  On: 10/28/2015 17:09  Her  ASSESSMENT: 55 y.o. [place] woman status post left breast upper outer quadrant biopsy 10/20/2014 for a clinical T2 N0, stage IIA invasive ductal carcinoma, grade 3, triple negative, with an MIB-1 of 71%  (a) right axillary lymph node biopsy negative 10/30/2014  (b) biopsy of posterior extent of right breast calcifications show ductal carcinoma is situ, high grade, with microinvasion  (1) genetics testing through the Breast High/Moderate Risk gene panel offered by [facility] found no deleterious mutations in ATM, BRCA1, BRCA2, CDH1, CHEK2, PALB2, PTEN, STK11, or TP53.   2) neoadjuvant chemotherapy consisting of cyclophosphamide and doxorubicin in dose dense fashion 4 given with Neulasta support, completed 01/06/2015, followed by weekly carboplatin and paclitaxel 12 completed 04/21/2015  (3) status post right mastectomy and sentinel lymph node sampling 05/13/2015 for a ypT1c ypN0 invasive ductal carcinoma, high-grade, with ample margins.  (4) adjuvant radiation not necessary  PLAN: [name] [name] is  now close to one year out from definitive surgery for her breast cancer with no evidence of disease recurrence. This is very favorable.  She will see her surgeon Dr. Donne Hazel October or November. She will return to see me in February after her next mammogram.  We discussed the fact that she has very dense breasts. She really does need to have the "3-D" tomography and that is the order I have entered for her February 2018 mammogram.  Otherwise she knows to  call for any problems that may develop before her next visit here.    [name], MD   03/27/2016 2:46 PM

## 2016-03-27 ENCOUNTER — Telehealth: Payer: Self-pay | Admitting: Oncology

## 2016-03-27 ENCOUNTER — Other Ambulatory Visit: Payer: Self-pay | Admitting: Oncology

## 2016-03-27 ENCOUNTER — Other Ambulatory Visit (HOSPITAL_BASED_OUTPATIENT_CLINIC_OR_DEPARTMENT_OTHER): Payer: BC Managed Care – PPO

## 2016-03-27 ENCOUNTER — Ambulatory Visit (HOSPITAL_BASED_OUTPATIENT_CLINIC_OR_DEPARTMENT_OTHER): Payer: BC Managed Care – PPO | Admitting: Oncology

## 2016-03-27 VITALS — BP 132/82 | HR 78 | Temp 98.6°F | Resp 18 | Ht 66.5 in | Wt 219.7 lb

## 2016-03-27 DIAGNOSIS — Z171 Estrogen receptor negative status [ER-]: Secondary | ICD-10-CM

## 2016-03-27 DIAGNOSIS — C50411 Malignant neoplasm of upper-outer quadrant of right female breast: Secondary | ICD-10-CM | POA: Diagnosis not present

## 2016-03-27 DIAGNOSIS — Z1231 Encounter for screening mammogram for malignant neoplasm of breast: Secondary | ICD-10-CM

## 2016-03-27 LAB — COMPREHENSIVE METABOLIC PANEL
ALBUMIN: 4.2 g/dL (ref 3.5–5.0)
ALK PHOS: 108 U/L (ref 40–150)
ALT: 30 U/L (ref 0–55)
ANION GAP: 10 meq/L (ref 3–11)
AST: 22 U/L (ref 5–34)
BILIRUBIN TOTAL: 0.44 mg/dL (ref 0.20–1.20)
BUN: 11.6 mg/dL (ref 7.0–26.0)
CO2: 27 meq/L (ref 22–29)
CREATININE: 1 mg/dL (ref 0.6–1.1)
Calcium: 10.1 mg/dL (ref 8.4–10.4)
Chloride: 104 mEq/L (ref 98–109)
EGFR: 79 mL/min/{1.73_m2} — AB (ref >90)
GLUCOSE: 91 mg/dL (ref 70–140)
Potassium: 4.2 mEq/L (ref 3.5–5.1)
Sodium: 142 mEq/L (ref 136–145)
TOTAL PROTEIN: 7.9 g/dL (ref 6.4–8.3)

## 2016-03-27 LAB — CBC WITH DIFFERENTIAL/PLATELET
BASO%: 0.1 % (ref 0.0–2.0)
Basophils Absolute: 0 10*3/uL (ref 0.0–0.1)
EOS ABS: 0.3 10*3/uL (ref 0.0–0.5)
EOS%: 4.8 % (ref 0.0–7.0)
HCT: 38.5 % (ref 34.8–46.6)
HEMOGLOBIN: 12.8 g/dL (ref 11.6–15.9)
LYMPH#: 2.4 10*3/uL (ref 0.9–3.3)
LYMPH%: 40.6 % (ref 14.0–49.7)
MCH: 29.6 pg (ref 25.1–34.0)
MCHC: 33.2 g/dL (ref 31.5–36.0)
MCV: 89.1 fL (ref 79.5–101.0)
MONO#: 0.6 10*3/uL (ref 0.1–0.9)
MONO%: 9.4 % (ref 0.0–14.0)
NEUT%: 45.1 % (ref 38.4–76.8)
NEUTROS ABS: 2.7 10*3/uL (ref 1.5–6.5)
PLATELETS: 247 10*3/uL (ref 145–400)
RBC: 4.32 10*6/uL (ref 3.70–5.45)
RDW: 15.8 % — AB (ref 11.2–14.5)
WBC: 6 10*3/uL (ref 3.9–10.3)

## 2016-03-27 NOTE — Telephone Encounter (Signed)
appt made and calendar sent to patient by mail for next year appts

## 2016-04-04 ENCOUNTER — Ambulatory Visit (INDEPENDENT_AMBULATORY_CARE_PROVIDER_SITE_OTHER): Payer: BC Managed Care – PPO | Admitting: Internal Medicine

## 2016-04-04 ENCOUNTER — Encounter: Payer: Self-pay | Admitting: Internal Medicine

## 2016-04-04 VITALS — BP 116/72 | HR 88 | Temp 98.3°F | Ht 64.0 in | Wt 221.0 lb

## 2016-04-04 DIAGNOSIS — I10 Essential (primary) hypertension: Secondary | ICD-10-CM

## 2016-04-04 DIAGNOSIS — M129 Arthropathy, unspecified: Secondary | ICD-10-CM

## 2016-04-04 DIAGNOSIS — K5909 Other constipation: Secondary | ICD-10-CM | POA: Diagnosis not present

## 2016-04-04 DIAGNOSIS — M19049 Primary osteoarthritis, unspecified hand: Secondary | ICD-10-CM

## 2016-04-04 DIAGNOSIS — R002 Palpitations: Secondary | ICD-10-CM

## 2016-04-04 MED ORDER — LINACLOTIDE 145 MCG PO CAPS: 145.0000 ug | ORAL_CAPSULE | Freq: Every day | ORAL | Status: DC

## 2016-04-04 MED ORDER — DICLOFENAC SODIUM 2 % TD SOLN: TRANSDERMAL | Status: DC

## 2016-04-04 NOTE — Progress Notes (Signed)
Subjective:    Patient ID: [name] [name], female    DOB: 26-Oct-1960, 55 y.o.   MRN: [id number]  HPI She is here for an acute visit.   In the morning she has trigger fingers in her 4th fingers in both hands - this can cause some discomfort.  The PIP joints on both of those fingers have been hurt.  She just started tylenol and it has helped.  She is wondering what the cause of her symptoms is and what she can do about it.     Palpitations:  Recently she has started to have palpitations.  She denies sob and chest pain.  She has been taking more goody powders recently for her finger pain and wonders if that is the cause.    Constipation:  She continues to suffer with constipation.  She has tried everything from changing her diet, she drinks lots of water, she has been taking stool softeners daily and has tried miralax. Nothing seems to helps.  Her colonoscopy was done 2 years ago.   Medications and allergies reviewed with patient and updated if appropriate.  Patient Active Problem List   Diagnosis Date Noted  . Malignant neoplasm of upper-outer quadrant of right female breast ([place]) 11/03/2015  . S/P mastectomy 05/13/2015  . Chemotherapy-induced neuropathy ([place]) 04/21/2015  . 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  . 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 Genetic testing 12/11/2014  . Hypokalemia 12/10/2014  . Chemotherapy induced neutropenia ([place]) 11/26/2014  . Breast cancer of upper-outer quadrant of right female breast ([place]) 10/23/2014  . Anxiety 04/29/2012  . Allergic rhinitis 11/29/2009  . 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 CONSTIPATION, CHRONIC 08/17/2008  . Dyslipidemia 04/24/2008  . METABOLIC SYNDROME X [id number]  . GERD 01/07/2008  . OBESITY, TRUNCAL 09/20/2007  . Essential hypertension 09/20/2007  . Multiple sclerosis ([place]) 09/18/2007    Current Outpatient Prescriptions on File Prior to Visit  Medication Sig Dispense Refill  . acetaminophen  (TYLENOL) 500 MG tablet Take 1,000 mg by mouth 2 (two) times daily as needed (pain). Reported on 09/23/2015    . ALPRAZolam (XANAX) 0.5 MG tablet Take 1 tablet (0.5 mg total) by mouth 2 (two) times daily as needed for anxiety or sleep. 30 tablet 1  . b complex vitamins tablet Take 1 tablet by mouth daily.    . ferrous gluconate (FERGON) 324 MG tablet Take 1 tablet (324 mg total) by mouth 2 (two) times daily with a meal. 180 tablet 3  . fluticasone (FLONASE) 50 MCG/ACT nasal spray Place 2 sprays into both nostrils daily. 16 g 6  . gabapentin (NEURONTIN) 100 MG capsule Patient to take 2 capsules twice a day 360 capsule 3  . Glatiramer Acetate (COPAXONE) 40 MG/ML SOSY Inject 40 mg into the skin 3 (three) times a week. 36 Syringe 2  . hydrochlorothiazide (HYDRODIURIL) 25 MG tablet TAKE 1 TABLET DAILY 90 tablet 2  . loratadine-pseudoephedrine (CLARITIN-D 12 HOUR) 5-120 MG tablet Take 1 tablet by mouth daily as needed (seasonal allergies). 30 tablet 3  . losartan (COZAAR) 50 MG tablet Take 1 tablet (50 mg total) by mouth 2 (two) times daily. 180 tablet 3  . metoprolol (LOPRESSOR) 50 MG tablet Take 1 tablet (50 mg total) by mouth 2 (two) times daily. 180 tablet 3  . pantoprazole (PROTONIX) 40 MG tablet Take 1 tablet (40 mg total) by mouth daily. 90 tablet 3  . potassium chloride (MICRO-K) 10 MEQ CR capsule Take 2 capsules (20 mEq total) by mouth 2 (  two) times daily. 180 capsule 3  . [DISCONTINUED] mometasone (NASONEX) 50 MCG/ACT nasal spray Place 2 sprays into the nose daily. 17 g 2   No current facility-administered medications on file prior to visit.    Past Medical History  Diagnosis Date  . OBESITY, TRUNCAL   . CONSTIPATION, CHRONIC   . Multiple sclerosis ([place])   . Allergic rhinitis due to other allergen   . HYPERTENSION   . HYPERLIPIDEMIA   . GERD   . Angioedema     ACEI  . Colon polyps 05/2014    adenomatous, colo q 5y  . Migraine headache   . Allergy   . Anxiety   . Breast cancer of  upper-outer quadrant of right female breast ([place]) 10/23/2014    s/p r mastectomy, neoadj chemo completed 04/21/15; neg genetic testing  . Hot flashes     Past Surgical History  Procedure Laterality Date  . Tubal ligation    . Colonoscopy    . Portacath placement Left 11/06/2014    Procedure: INSERTION PORT-A-CATH;  Surgeon: [name], MD;  Location: [place];  Service: General;  Laterality: Left;  . Simple mastectomy with axillary sentinel node biopsy Right 05/13/2015    Procedure: RIGHT TOTAL  MASTECTOMY WITH RIGHT  AXILLARY SENTINEL NODE BIOPSY;  Surgeon: [name], MD;  Location: [place];  Service: General;  Laterality: Right;  . Port-a-cath removal Left 05/13/2015    Procedure: REMOVAL PORT-A-CATH;  Surgeon: [name], MD;  Location: [facility];  Service: General;  Laterality: Left;    Social History   Social History  . Marital Status: Single    Spouse Name: N/A  . Number of Children: 2  . Years of Education: 12   Occupational History  . [place] History Main Topics  . Smoking status: Never Smoker   . Smokeless tobacco: Never Used  . Alcohol Use: No  . Drug Use: No  . Sexual Activity: Not Asked   Other Topics Concern  . 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    Patient is single and lives alone.    Patient is right handed.     Family History  Problem Relation Age of Onset  . Coronary artery disease Mother   . Heart disease Mother   . Diabetes Mother   . Hypertension Other   . Hyperlipidemia Other   . Diabetes Other   . Colon cancer Neg Hx   . Alcohol abuse Father   . COPD Maternal Grandmother     Review of Systems  Constitutional: Negative for fever.  Respiratory: Negative for shortness of breath.   Cardiovascular: Positive for palpitations. Negative for chest pain and leg swelling.    Gastrointestinal: Positive for abdominal pain (related to constipation only - discomfort) and constipation. Negative for blood in stool.  Musculoskeletal: Positive for arthralgias (hand pain).  Neurological: Negative for light-headedness and headaches.       Objective:   Filed Vitals:   04/04/16 1604  BP: 116/72  Pulse: 88  Temp: 98.3 F (36.8 C)   Filed Weights   04/04/16 1604  Weight: 221 lb (100.245 kg)   Body mass index is 37.92 kg/(m^2).   Physical Exam Constitutional: Appears well-developed and well-nourished. No distress.  Neck: Neck supple. No tracheal deviation present. No thyromegaly present.  No carotid bruit. No cervical adenopathy.   Cardiovascular:  Normal rate, regular rhythm and normal heart sounds.   No murmur heard.  No edema Pulmonary/Chest: Effort normal and breath sounds normal. No respiratory distress. No wheezes.  Msk: no joint deformities, no joint swelling, mild tenderness with palpation 4th PIP b/l, no contractures of hand       Assessment & Plan:   See Problem List for Assessment and Plan of chronic medical problems.

## 2016-04-04 NOTE — Assessment & Plan Note (Signed)
4th PIP b/l pain, without swelling and trigger finger in those fingers Continue tylenol Can try topical pennsaid

## 2016-04-04 NOTE — Progress Notes (Signed)
Pre visit review using our clinic review tool, if applicable. No additional management support is needed unless otherwise documented below in the visit note. 

## 2016-04-04 NOTE — Patient Instructions (Addendum)
Start linzess - take >11min before 1st meal, but do not crush/chew contents.  Try the topical medication (voltaren)  for your joints.  Continue to take tylenol.

## 2016-04-04 NOTE — Assessment & Plan Note (Signed)
Likely from consuming goody powder - will stop using it Exam normal, no other concerning symptoms If palpitations do not resolve she will follow up

## 2016-04-04 NOTE — Assessment & Plan Note (Signed)
BP well controlled Current regimen effective and well tolerated Continue current medications at current doses  

## 2016-04-04 NOTE — Assessment & Plan Note (Signed)
Iron may be contributing, but feels she needs to continue this Continue increased water intake Trial of linzess at 145 mcg daily - sample given and rx sent

## 2016-04-15 IMAGING — US US EXTREM LOW VENOUS*R*
1 series · 13 of 24 positions shown · non-contrast
Comparison: None.

CLINICAL DATA: Right leg pain and swelling



[Series 1: us extrem low venous*right* · 13 of 35 slices shown]
[im 1/35]
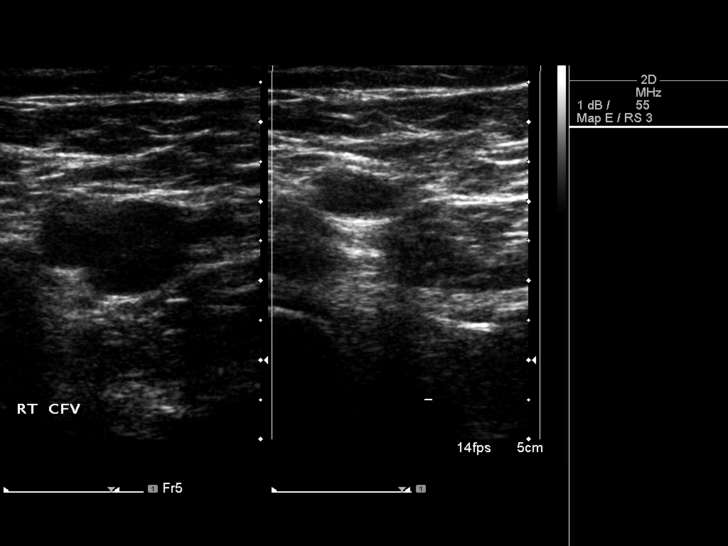
[im 3/35]
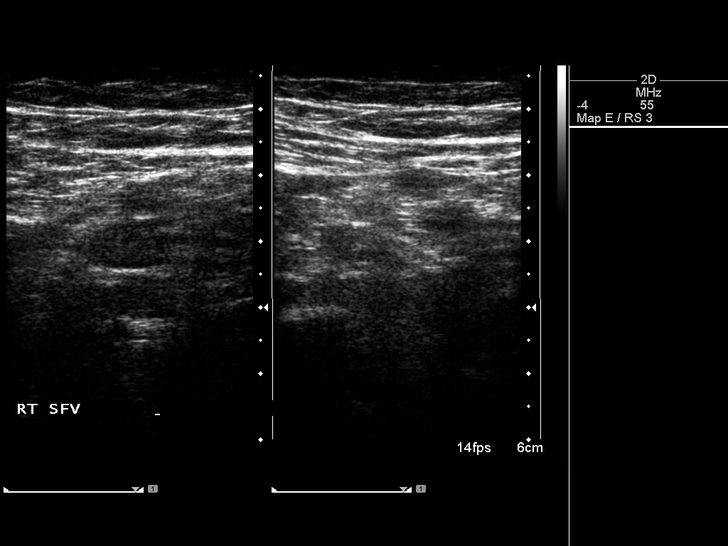
[im 6/35]
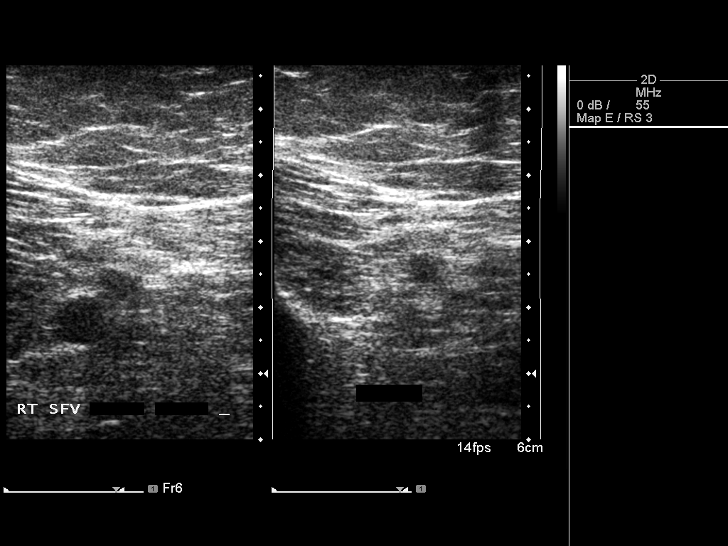
[im 9/35]
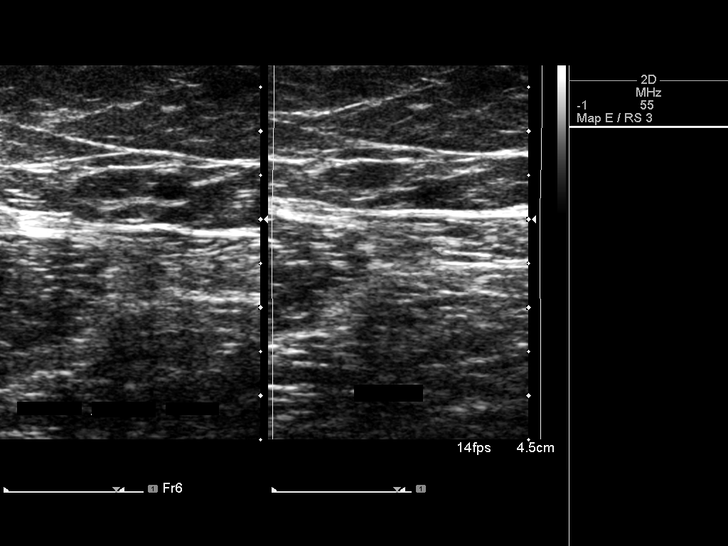
[im 12/35]
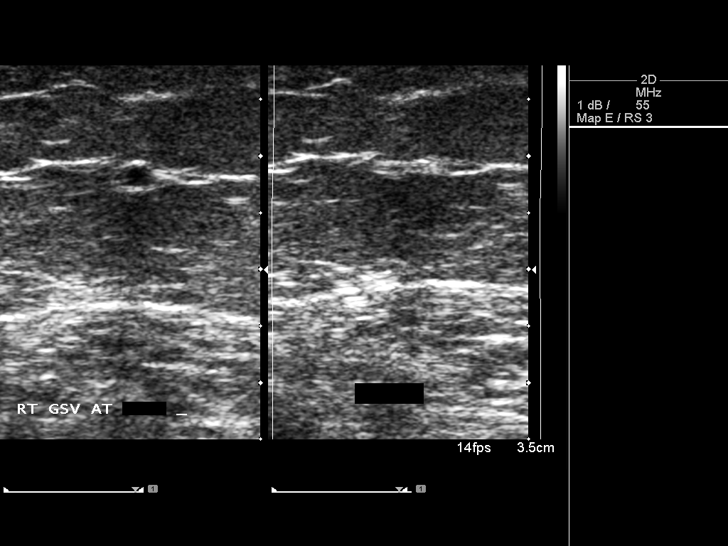
[im 15/35]
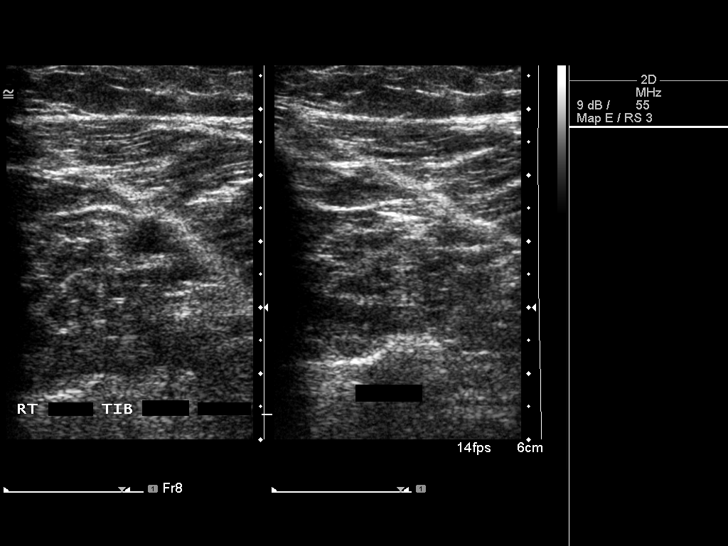
[im 18/35]
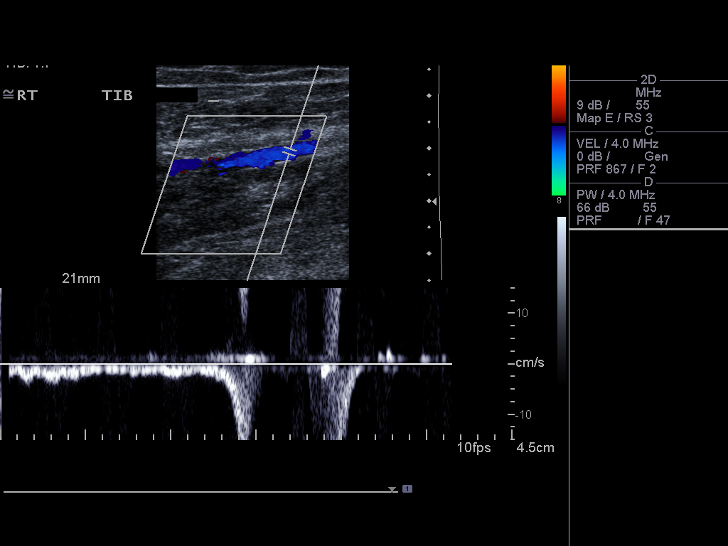
[im 20/35]
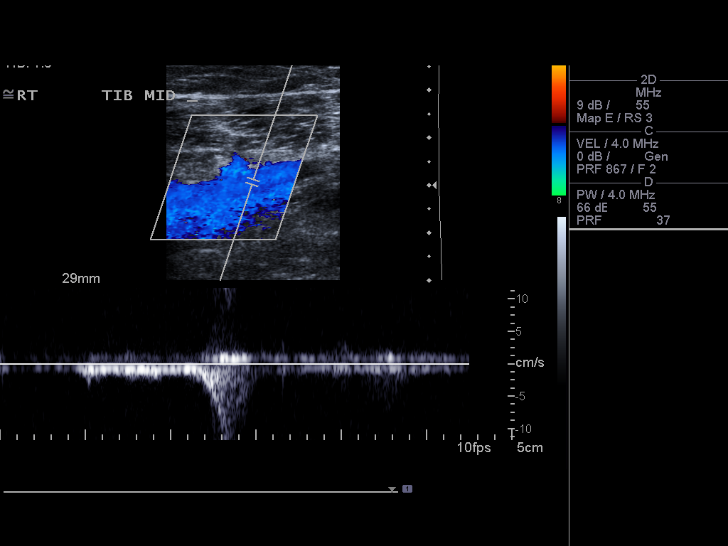
[im 23/35]
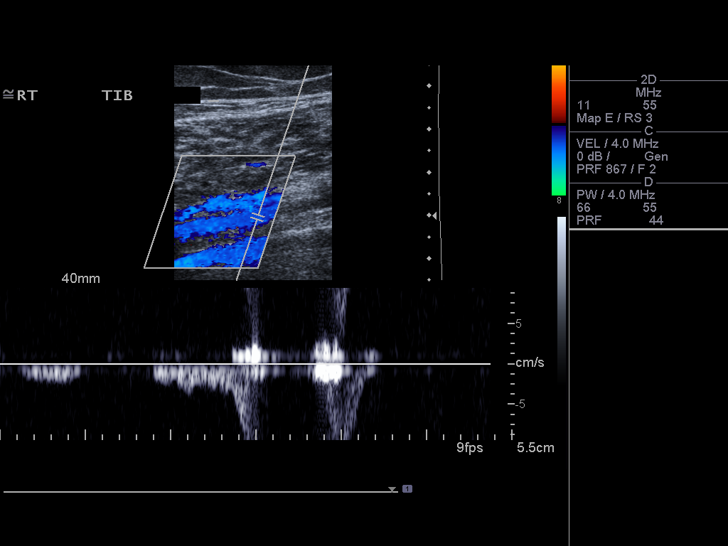
[im 26/35]
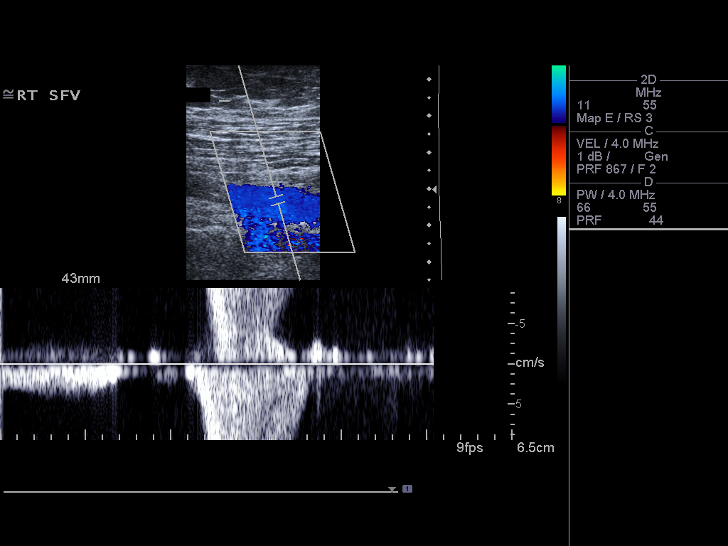
[im 29/35]
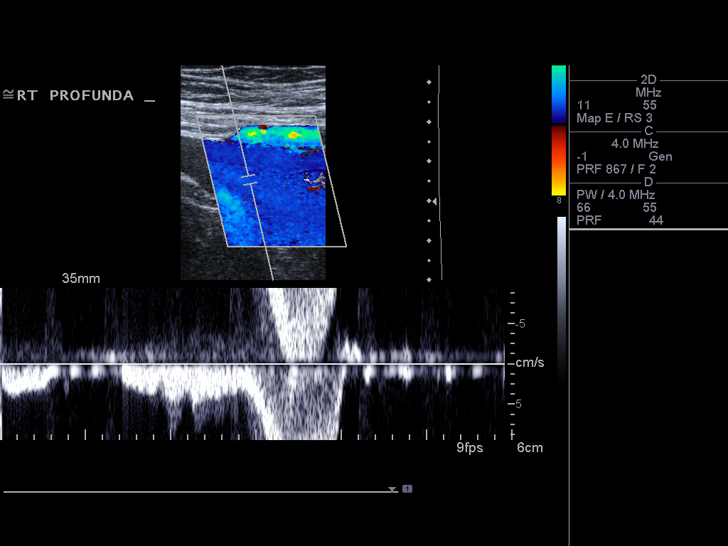
[im 32/35]
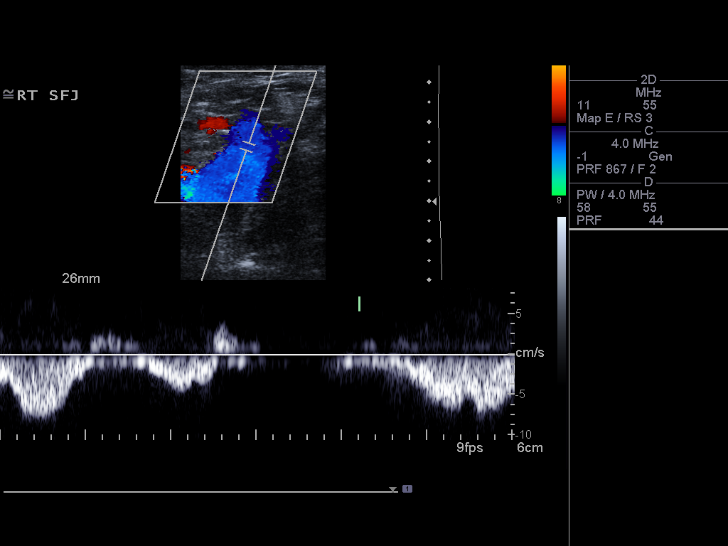
[im 35/35]
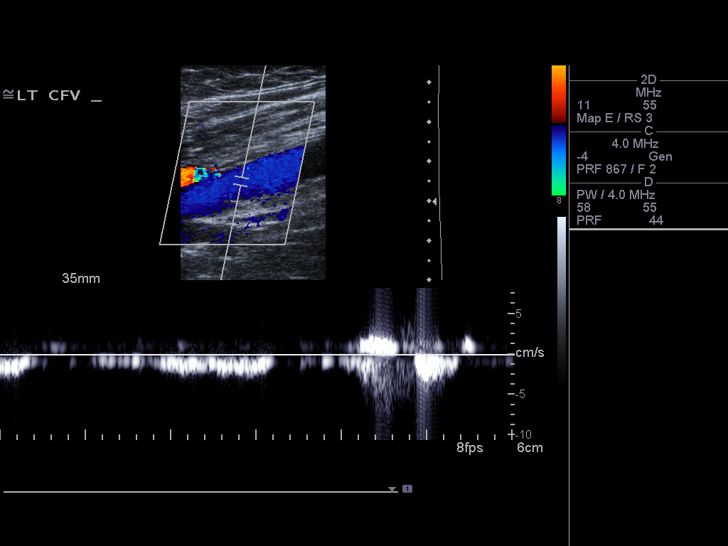

[13 of 24 positions shown; findings below may reference images not displayed]

FINDINGS: Contralateral Common Femoral Vein: Respiratory phasicity is normal
and symmetric with the symptomatic side. No evidence of thrombus.
Normal compressibility.

Common Femoral Vein: No evidence of thrombus. Normal
compressibility, respiratory phasicity and response to augmentation.

Saphenofemoral Junction: No evidence of thrombus. Normal
compressibility and flow on color Doppler imaging.

Profunda Femoral Vein: No evidence of thrombus. Normal
compressibility and flow on color Doppler imaging.

Femoral Vein: No evidence of thrombus. Normal compressibility,
respiratory phasicity and response to augmentation.

Popliteal Vein: No evidence of thrombus. Normal compressibility,
respiratory phasicity and response to augmentation.

Calf Veins: No evidence of thrombus. Normal compressibility and flow
on color Doppler imaging.

Superficial Great Saphenous Vein: No evidence of thrombus. Normal
compressibility and flow on color Doppler imaging.

Venous Reflux:  None.

Other Findings:  None.
IMPRESSION: No evidence of deep venous thrombosis.

## 2016-04-24 ENCOUNTER — Encounter: Payer: Self-pay | Admitting: Internal Medicine

## 2016-04-25 ENCOUNTER — Ambulatory Visit (INDEPENDENT_AMBULATORY_CARE_PROVIDER_SITE_OTHER): Payer: BC Managed Care – PPO | Admitting: Internal Medicine

## 2016-04-25 ENCOUNTER — Encounter: Payer: Self-pay | Admitting: Internal Medicine

## 2016-04-25 DIAGNOSIS — R059 Cough, unspecified: Secondary | ICD-10-CM

## 2016-04-25 DIAGNOSIS — J309 Allergic rhinitis, unspecified: Secondary | ICD-10-CM

## 2016-04-25 DIAGNOSIS — I10 Essential (primary) hypertension: Secondary | ICD-10-CM | POA: Diagnosis not present

## 2016-04-25 DIAGNOSIS — R05 Cough: Secondary | ICD-10-CM | POA: Insufficient documentation

## 2016-04-25 HISTORY — DX: Cough, unspecified: R05.9

## 2016-04-25 MED ORDER — CEPHALEXIN 500 MG PO CAPS: 500.0000 mg | ORAL_CAPSULE | Freq: Four times a day (QID) | ORAL | 0 refills | Status: AC

## 2016-04-25 NOTE — Assessment & Plan Note (Signed)
stable overall by history and exam, recent data reviewed with pt, and pt to continue medical treatment as before,  to f/u any worsening symptoms or concerns BP Readings from Last 3 Encounters:  04/25/16 132/78  04/04/16 116/72  03/27/16 132/82

## 2016-04-25 NOTE — Progress Notes (Addendum)
Subjective:    Patient ID: [name] [name], female    DOB: Sep 21, 1960, 55 y.o.   MRN: [id number]  HPI   Here with 2-3 days acute onset fever, severe ST, pressure, headache, general weakness and malaise, without cough, but pt denies chest pain, wheezing, increased sob or doe, orthopnea, PND, increased LE swelling, palpitations, dizziness or syncope. Pt denies new neurological symptoms such as new headache, or facial or extremity weakness or numbness   Pt denies polydipsia, polyuria, Does have several wks ongoing but mild nasal allergy symptoms with clearish congestion. Past Medical History:  Diagnosis Date  . Allergic rhinitis due to other allergen   . Allergy   . Angioedema    ACEI  . Anxiety   . Breast cancer of upper-outer quadrant of right female breast ([place]) 10/23/2014   s/p r mastectomy, neoadj chemo completed 04/21/15; neg genetic testing  . Colon polyps 05/2014   adenomatous, colo q 5y  . CONSTIPATION, CHRONIC   . GERD   . Hot flashes   . HYPERLIPIDEMIA   . HYPERTENSION   . Migraine headache   . Multiple sclerosis ([place])   . OBESITY, TRUNCAL    Past Surgical History:  Procedure Laterality Date  . COLONOSCOPY    . PORT-A-CATH REMOVAL Left 05/13/2015   Procedure: REMOVAL PORT-A-CATH;  Surgeon: [name], MD;  Location: [place];  Service: General;  Laterality: Left;  . PORTACATH PLACEMENT Left 11/06/2014   Procedure: INSERTION PORT-A-CATH;  Surgeon: [name], MD;  Location: [place];  Service: General;  Laterality: Left;  . SIMPLE MASTECTOMY WITH AXILLARY SENTINEL NODE BIOPSY Right 05/13/2015   Procedure: RIGHT TOTAL  MASTECTOMY WITH RIGHT  AXILLARY SENTINEL NODE BIOPSY;  Surgeon: [name], MD;  Location: [facility];  Service: General;  Laterality: Right;  . TUBAL LIGATION      reports that she has never smoked. She has never used smokeless tobacco. She reports that she does not drink alcohol or use drugs. family history includes Alcohol abuse  in her father; COPD in her maternal grandmother; Coronary artery disease in her mother; Diabetes in her mother and other; Heart disease in her mother; Hyperlipidemia in her other; Hypertension in her other. Allergies  Allergen Reactions  . Ace Inhibitors Anaphylaxis     Angioedema (tongue swelling)  . Clarithromycin Anaphylaxis    Throat swells  . Levaquin [Levofloxacin] Other (See Comments)    Tendon pain - shoulder and calf   Current Outpatient Prescriptions on File Prior to Visit  Medication Sig Dispense Refill  . acetaminophen (TYLENOL) 500 MG tablet Take 1,000 mg by mouth 2 (two) times daily as needed (pain). Reported on 09/23/2015    . ALPRAZolam (XANAX) 0.5 MG tablet Take 1 tablet (0.5 mg total) by mouth 2 (two) times daily as needed for anxiety or sleep. 30 tablet 1  . b complex vitamins tablet Take 1 tablet by mouth daily.    . ferrous gluconate (FERGON) 324 MG tablet Take 1 tablet (324 mg total) by mouth 2 (two) times daily with a meal. 180 tablet 3  . fluticasone (FLONASE) 50 MCG/ACT nasal spray Place 2 sprays into both nostrils daily. 16 g 6  . gabapentin (NEURONTIN) 100 MG capsule Patient to take 2 capsules twice a day 360 capsule 3  . Glatiramer Acetate (COPAXONE) 40 MG/ML SOSY Inject 40 mg into the skin 3 (three) times a week. 36 Syringe 2  . hydrochlorothiazide (HYDRODIURIL) 25 MG tablet TAKE 1 TABLET DAILY 90 tablet 2  .  loratadine-pseudoephedrine (CLARITIN-D 12 HOUR) 5-120 MG tablet Take 1 tablet by mouth daily as needed (seasonal allergies). 30 tablet 3  . losartan (COZAAR) 50 MG tablet Take 1 tablet (50 mg total) by mouth 2 (two) times daily. 180 tablet 3  . metoprolol (LOPRESSOR) 50 MG tablet Take 1 tablet (50 mg total) by mouth 2 (two) times daily. 180 tablet 3  . pantoprazole (PROTONIX) 40 MG tablet Take 1 tablet (40 mg total) by mouth daily. 90 tablet 3  . potassium chloride (MICRO-K) 10 MEQ CR capsule Take 2 capsules (20 mEq total) by mouth 2 (two) times daily. 180  capsule 3  . [DISCONTINUED] mometasone (NASONEX) 50 MCG/ACT nasal spray Place 2 sprays into the nose daily. 17 g 2   No current facility-administered medications on file prior to visit.    Review of Systems  Constitutional: Negative for unusual diaphoresis or night sweats HENT: Negative for ear swelling or discharge Eyes: Negative for worsening visual haziness  Respiratory: Negative for choking and stridor.   Gastrointestinal: Negative for distension or worsening eructation Genitourinary: Negative for retention or change in urine volume.  Musculoskeletal: Negative for other MSK pain or swelling Skin: Negative for color change and worsening wound Neurological: Negative for tremors and numbness other than noted  Psychiatric/Behavioral: Negative for decreased concentration or agitation other than above       Objective:   Physical Exam BP 132/78   Pulse 89   Temp 98.2 F (36.8 C) (Oral)   Resp 20   Wt 219 lb (99.3 kg)   LMP 11/01/2012   SpO2 96%   BMI 37.59 kg/m  VS noted, mild ill Constitutional: Pt appears in no apparent distress HENT: Head: NCAT.  Right Ear: External ear normal.  Left Ear: External ear normal.  Bilat tm's with mild erythema.  Max sinus areas non tender.  Pharynx with severe erythema, no exudate Eyes: . Pupils are equal, round, and reactive to light. Conjunctivae and EOM are normal Neck: Normal range of motion. Neck supple. with bilat lymph node increased submandibular Cardiovascular: Normal rate and regular rhythm.   Pulmonary/Chest: Effort normal and breath sounds decreaseed without rales or wheezing.  Neurological: Pt is alert. Not confused , motor grossly intact Skin: Skin is warm. No rash, no LE edema Psychiatric: Pt behavior is normal. No agitation.     Assessment & Plan:

## 2016-04-25 NOTE — Assessment & Plan Note (Signed)
Mild to mod, c/w pharyngitis/bronchitis vs pna, declines cxr, for antibx course,  to f/u any worsening symptoms or concerns

## 2016-04-25 NOTE — Assessment & Plan Note (Signed)
For otc zyrtec and nasacort asd,  to f/u any worsening symptoms or concerns

## 2016-04-25 NOTE — Patient Instructions (Addendum)
Please take all new medication as prescribed - the antibiotic  You can also take Delsym OTC for cough, and/or Mucinex (or it's generic off brand) for congestion, and tylenol as needed for pain.  Please continue all other medications as before, and refills have been done if requested.  Please have the pharmacy call with any other refills you may need.  Please keep your appointments with your specialists as you may have planned    

## 2016-04-25 NOTE — Progress Notes (Signed)
Pre visit review using our clinic review tool, if applicable. No additional management support is needed unless otherwise documented below in the visit note. 

## 2016-04-28 ENCOUNTER — Telehealth: Payer: Self-pay | Admitting: *Deleted

## 2016-04-28 MED ORDER — FLUCONAZOLE 150 MG PO TABS: 150.0000 mg | ORAL_TABLET | Freq: Every day | ORAL | 0 refills | Status: DC

## 2016-04-28 NOTE — Telephone Encounter (Signed)
Rec'd call pt states she saw Dr. Jenny Reichmann on Tues he gave her antibiotic and now she is starting to get a yeast infection. Requesting rx for diflucan. Verified pharmacy inform will send to [facility]...Johny Chess

## 2016-05-02 ENCOUNTER — Ambulatory Visit: Payer: BC Managed Care – PPO | Admitting: Nurse Practitioner

## 2016-05-03 ENCOUNTER — Encounter: Payer: Self-pay | Admitting: Nurse Practitioner

## 2016-05-11 ENCOUNTER — Encounter: Payer: Self-pay | Admitting: Internal Medicine

## 2016-05-12 NOTE — Telephone Encounter (Signed)
FMLA forms completed, signed and placed in cabinet for pt pick up, pt advised of same

## 2016-05-24 ENCOUNTER — Encounter: Payer: Self-pay | Admitting: Internal Medicine

## 2016-05-24 ENCOUNTER — Telehealth: Payer: Self-pay | Admitting: Nutrition

## 2016-05-24 ENCOUNTER — Ambulatory Visit (INDEPENDENT_AMBULATORY_CARE_PROVIDER_SITE_OTHER): Payer: BC Managed Care – PPO | Admitting: Internal Medicine

## 2016-05-24 VITALS — BP 124/76 | HR 96 | Temp 98.1°F | Resp 16 | Wt 218.0 lb

## 2016-05-24 DIAGNOSIS — H60391 Other infective otitis externa, right ear: Secondary | ICD-10-CM | POA: Diagnosis not present

## 2016-05-24 DIAGNOSIS — R002 Palpitations: Secondary | ICD-10-CM

## 2016-05-24 DIAGNOSIS — H60399 Other infective otitis externa, unspecified ear: Secondary | ICD-10-CM | POA: Insufficient documentation

## 2016-05-24 HISTORY — DX: Other infective otitis externa, unspecified ear: H60.399

## 2016-05-24 MED ORDER — ALPRAZOLAM 0.5 MG PO TABS: 0.5000 mg | ORAL_TABLET | Freq: Two times a day (BID) | ORAL | 1 refills | Status: DC | PRN

## 2016-05-24 MED ORDER — ALPRAZOLAM 0.5 MG PO TABS: 0.5000 mg | ORAL_TABLET | Freq: Two times a day (BID) | ORAL | 0 refills | Status: DC | PRN

## 2016-05-24 MED ORDER — NEOMYCIN-POLYMYXIN-HC 3.5-10000-1 OT SOLN: 4.0000 [drp] | Freq: Four times a day (QID) | OTIC | 0 refills | Status: DC

## 2016-05-24 NOTE — Progress Notes (Signed)
Subjective:    Patient ID: [name] [name], female    DOB: 1960/12/27, 55 y.o.   MRN: [id number]  HPI She is here for an acute visit.   She was seen 04/25/16 for a URI and was prescribed keflex.   She completed it and her symptoms have improved. She still has some symptoms including sore throat, coughing up some phlegm that is lighter in color and slightly less thick than it had been and her right ear is itchy. She denies any other concerning symptoms. She started having palpitations last week and thought it may have been related to the Keflex. She was not sure, but wanted to blame something.  She did take Xanax and it helped. She feels better and has not noticed any palpitations in the past several days. The palpitations were intermittent and she has had them in the past, but these seem to be lasting longer. She denies any chest pain, lightheadedness along with palpitations.  She does not drink any caffeine and denies any other new medications or supplements.   Medications and allergies reviewed with patient and updated if appropriate.  Patient Active Problem List   Diagnosis Date Noted  . Cough 04/25/2016  . Palpitations 04/04/2016  . 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 Malignant neoplasm of upper-outer quadrant of right female breast ([place]) 11/03/2015  . S/P mastectomy 05/13/2015  . 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  . 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 Genetic testing 12/11/2014  . Hypokalemia 12/10/2014  . Chemotherapy induced neutropenia ([place]) 11/26/2014  . Breast cancer of upper-outer quadrant of right female breast ([place]) 10/23/2014  . Anxiety 04/29/2012  . Allergic rhinitis 11/29/2009  . 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 CONSTIPATION, CHRONIC 08/17/2008  . Dyslipidemia 04/24/2008  . METABOLIC SYNDROME X [id number]  . GERD 01/07/2008  . OBESITY, TRUNCAL 09/20/2007  . Essential hypertension 09/20/2007  .  Multiple sclerosis ([place]) 09/18/2007    Current Outpatient Prescriptions on File Prior to Visit  Medication Sig Dispense Refill  . acetaminophen (TYLENOL) 500 MG tablet Take 1,000 mg by mouth 2 (two) times daily as needed (pain). Reported on 09/23/2015    . ALPRAZolam (XANAX) 0.5 MG tablet Take 1 tablet (0.5 mg total) by mouth 2 (two) times daily as needed for anxiety or sleep. 30 tablet 1  . b complex vitamins tablet Take 1 tablet by mouth daily.    . ferrous gluconate (FERGON) 324 MG tablet Take 1 tablet (324 mg total) by mouth 2 (two) times daily with a meal. 180 tablet 3  . fluticasone (FLONASE) 50 MCG/ACT nasal spray Place 2 sprays into both nostrils daily. 16 g 6  . gabapentin (NEURONTIN) 100 MG capsule Patient to take 2 capsules twice a day 360 capsule 3  . Glatiramer Acetate (COPAXONE) 40 MG/ML SOSY Inject 40 mg into the skin 3 (three) times a week. 36 Syringe 2  . hydrochlorothiazide (HYDRODIURIL) 25 MG tablet TAKE 1 TABLET DAILY 90 tablet 2  . loratadine-pseudoephedrine (CLARITIN-D 12 HOUR) 5-120 MG tablet Take 1 tablet by mouth daily as needed (seasonal allergies). 30 tablet 3  . losartan (COZAAR) 50 MG tablet Take 1 tablet (50 mg total) by mouth 2 (two) times daily. 180 tablet 3  . metoprolol (LOPRESSOR) 50 MG tablet Take 1 tablet (50 mg total) by mouth 2 (two) times daily. 180 tablet 3  . pantoprazole (PROTONIX) 40 MG tablet Take 1 tablet (40 mg total) by mouth daily. 90 tablet 3  . potassium chloride (  MICRO-K) 10 MEQ CR capsule Take 2 capsules (20 mEq total) by mouth 2 (two) times daily. 180 capsule 3  . [DISCONTINUED] mometasone (NASONEX) 50 MCG/ACT nasal spray Place 2 sprays into the nose daily. 17 g 2   No current facility-administered medications on file prior to visit.     Past Medical History:  Diagnosis Date  . Allergic rhinitis due to other allergen   . Allergy   . Angioedema    ACEI  . Anxiety   . Breast cancer of upper-outer quadrant of right female breast ([place])  10/23/2014   s/p r mastectomy, neoadj chemo completed 04/21/15; neg genetic testing  . Colon polyps 05/2014   adenomatous, colo q 5y  . CONSTIPATION, CHRONIC   . GERD   . Hot flashes   . HYPERLIPIDEMIA   . HYPERTENSION   . Migraine headache   . Multiple sclerosis ([place])   . OBESITY, TRUNCAL     Past Surgical History:  Procedure Laterality Date  . COLONOSCOPY    . PORT-A-CATH REMOVAL Left 05/13/2015   Procedure: REMOVAL PORT-A-CATH;  Surgeon: [name], MD;  Location: [facility];  Service: General;  Laterality: Left;  . PORTACATH PLACEMENT Left 11/06/2014   Procedure: INSERTION PORT-A-CATH;  Surgeon: [name], MD;  Location: [facility];  Service: General;  Laterality: Left;  . SIMPLE MASTECTOMY WITH AXILLARY SENTINEL NODE BIOPSY Right 05/13/2015   Procedure: RIGHT TOTAL  MASTECTOMY WITH RIGHT  AXILLARY SENTINEL NODE BIOPSY;  Surgeon: [name], MD;  Location: [facility];  Service: General;  Laterality: Right;  . TUBAL LIGATION      Social History   Social History  . Marital status: Single    Spouse name: N/A  . Number of children: 2  . Years of education: 12   Occupational History  . [place] History Main Topics  . Smoking status: Never Smoker  . Smokeless tobacco: Never Used  . Alcohol use No  . Drug use: No  . Sexual activity: Not on file   Other Topics Concern  . 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    Patient is single and lives alone.    Patient is right handed.     Family History  Problem Relation Age of Onset  . Coronary artery disease Mother   . Heart disease Mother   . Diabetes Mother   . Hypertension Other   . Hyperlipidemia Other   . Diabetes Other   . Colon cancer Neg Hx   . Alcohol abuse Father   . COPD Maternal Grandmother     Review of Systems  Constitutional: Negative for  fever.  HENT: Positive for sore throat. Negative for ear discharge, ear pain (Right ear itching in canal) and sinus pressure.   Respiratory: Positive for cough (Productive, improved). Negative for shortness of breath and wheezing.   Cardiovascular: Positive for palpitations (None in last few days). Negative for chest pain.  Neurological: Negative for light-headedness and headaches.       Objective:   Vitals:   05/24/16 1616  BP: 124/76  Pulse: 96  Resp: 16  Temp: 98.1 F (36.7 C)   Filed Weights   05/24/16 1616  Weight: 218 lb (98.9 kg)   Body mass index is 37.42 kg/m.   Physical Exam  Constitutional: She appears well-developed and well-nourished. No distress.  HENT:  Head: Normocephalic and atraumatic.  Right Ear: External ear normal.  Left Ear: External ear normal.  Mouth/Throat: Oropharynx is clear and moist. No oropharyngeal exudate.  Right ear canal erythematous, no cerumen, tympanic membrane normal. Left ear canal and tympanic membrane normal  Eyes: Conjunctivae are normal.  Neck: Neck supple. No tracheal deviation present. No thyromegaly present.  Cardiovascular: Normal rate, regular rhythm and normal heart sounds.   Pulmonary/Chest: Effort normal and breath sounds normal. No respiratory distress. She has no wheezes. She has no rales.  Lymphadenopathy:    She has no cervical adenopathy.  Skin: She is not diaphoretic.          Assessment & Plan:   See Problem List for Assessment and Plan of chronic medical problems.

## 2016-05-24 NOTE — Telephone Encounter (Signed)
Patient contacted me by phone with further questions about healthy diet and weight loss. Patient reports she continues to try to modify her intake so that she can achieve weight loss. Provided brief education. Patient was appreciative.

## 2016-05-24 NOTE — Assessment & Plan Note (Signed)
Right ear canal erythematous consistent with otitis externa Start antibiotic eardrops Follow-up if symptoms do not improve

## 2016-05-24 NOTE — Assessment & Plan Note (Signed)
Resolved after taking Xanax No obvious cause-may have been related to the infection or underlying stress/anxiety EKG today shows PACs, but otherwise normal Given that her symptoms have resolved no further evaluation needed at this time Follow-up if symptoms recur

## 2016-05-24 NOTE — Progress Notes (Signed)
Pre visit review using our clinic review tool, if applicable. No additional management support is needed unless otherwise documented below in the visit note. 

## 2016-05-24 NOTE — Patient Instructions (Signed)
Your EKG is normal.   Medications reviewed and updated.  Changes include an ear drop for your outer ear infection.   Your prescription(s) have been submitted to your pharmacy. Please take as directed and contact our office if you believe you are having problem(s) with the medication(s).

## 2016-05-30 ENCOUNTER — Telehealth: Payer: Self-pay | Admitting: *Deleted

## 2016-05-30 ENCOUNTER — Ambulatory Visit (INDEPENDENT_AMBULATORY_CARE_PROVIDER_SITE_OTHER)
Admission: RE | Admit: 2016-05-30 | Discharge: 2016-05-30 | Disposition: A | Discharge status: Home / Self Care | Payer: BC Managed Care – PPO | Source: Ambulatory Visit | Attending: Internal Medicine | Admitting: Internal Medicine

## 2016-05-30 ENCOUNTER — Other Ambulatory Visit (INDEPENDENT_AMBULATORY_CARE_PROVIDER_SITE_OTHER): Payer: BC Managed Care – PPO

## 2016-05-30 DIAGNOSIS — R6883 Chills (without fever): Secondary | ICD-10-CM

## 2016-05-30 LAB — COMPREHENSIVE METABOLIC PANEL
ALT: 18 U/L (ref 0–35)
AST: 15 U/L (ref 0–37)
Albumin: 4.1 g/dL (ref 3.5–5.2)
Alkaline Phosphatase: 86 U/L (ref 39–117)
BUN: 13 mg/dL (ref 6–23)
CHLORIDE: 103 meq/L (ref 96–112)
CO2: 32 mEq/L (ref 19–32)
CREATININE: 0.94 mg/dL (ref 0.40–1.20)
Calcium: 9 mg/dL (ref 8.4–10.5)
GFR: 79.8 mL/min (ref >60.00)
GLUCOSE: 139 mg/dL — AB (ref 70–99)
Potassium: 3.9 mEq/L (ref 3.5–5.1)
SODIUM: 139 meq/L (ref 135–145)
Total Bilirubin: 0.3 mg/dL (ref 0.2–1.2)
Total Protein: 7.1 g/dL (ref 6.0–8.3)

## 2016-05-30 LAB — URINALYSIS, ROUTINE W REFLEX MICROSCOPIC
Bilirubin Urine: NEGATIVE
Hgb urine dipstick: NEGATIVE
Ketones, ur: NEGATIVE
Leukocytes, UA: NEGATIVE
Nitrite: NEGATIVE
RBC / HPF: NONE SEEN (ref 0–?)
SPECIFIC GRAVITY, URINE: 1.01 (ref 1.000–1.030)
Total Protein, Urine: NEGATIVE
Urine Glucose: NEGATIVE
Urobilinogen, UA: 0.2 (ref 0.0–1.0)
WBC UA: NONE SEEN (ref 0–?)
pH: 6 (ref 5.0–8.0)

## 2016-05-30 LAB — CBC WITH DIFFERENTIAL/PLATELET
BASOS PCT: 0.3 % (ref 0.0–3.0)
Basophils Absolute: 0 10*3/uL (ref 0.0–0.1)
EOS ABS: 0.3 10*3/uL (ref 0.0–0.7)
Eosinophils Relative: 5.2 % — ABNORMAL HIGH (ref 0.0–5.0)
HCT: 36.7 % (ref 36.0–46.0)
Hemoglobin: 12.6 g/dL (ref 12.0–15.0)
Lymphocytes Relative: 30.4 % (ref 12.0–46.0)
Lymphs Abs: 1.8 10*3/uL (ref 0.7–4.0)
MCHC: 34.3 g/dL (ref 30.0–36.0)
MCV: 87.9 fl (ref 78.0–100.0)
MONO ABS: 0.4 10*3/uL (ref 0.1–1.0)
Monocytes Relative: 6.6 % (ref 3.0–12.0)
Neutro Abs: 3.5 10*3/uL (ref 1.4–7.7)
Neutrophils Relative %: 57.5 % (ref 43.0–77.0)
Platelets: 238 10*3/uL (ref 150.0–400.0)
RBC: 4.18 Mil/uL (ref 3.87–5.11)
RDW: 14.9 % (ref 11.5–15.5)
WBC: 6.1 10*3/uL (ref 4.0–10.5)

## 2016-05-30 NOTE — Telephone Encounter (Signed)
Rec'd call pt states she keep having episodes where she is shivering. Last night she had another episode and it wouldn't stop. She states that her teeth was shattering and everything. She denies any cold sxs, no fever, or anything. She state she took a nerve pill to try to calm herself down. She is wanting to know what would make her keep having these episodes...[name] [name]

## 2016-05-30 NOTE — Telephone Encounter (Signed)
Notified pt w/MD response.../lmb 

## 2016-05-30 NOTE — Telephone Encounter (Signed)
Have her come in to the lab and give blood work, urine and have  A chest xray to rule out any infection.    All tests ordered.  Does not need to fast.

## 2016-05-31 ENCOUNTER — Encounter: Payer: Self-pay | Admitting: Internal Medicine

## 2016-06-01 LAB — URINE CULTURE

## 2016-06-05 ENCOUNTER — Telehealth: Payer: Self-pay | Admitting: *Deleted

## 2016-06-05 NOTE — Telephone Encounter (Signed)
Rec'd call pt states MD [facility], and [facility]. She has to use [facility] now. Inform pr will update her pharmacy & call into [facility].../lmb   Called [facility] spoke w/[place] pharmacist gave Md approval for alprazolam from 9/6...Johny Chess

## 2016-06-07 ENCOUNTER — Telehealth: Payer: Self-pay | Admitting: *Deleted

## 2016-06-07 DIAGNOSIS — C50411 Malignant neoplasm of upper-outer quadrant of right female breast: Secondary | ICD-10-CM

## 2016-06-07 MED ORDER — GABAPENTIN 100 MG PO CAPS: 200.0000 mg | ORAL_CAPSULE | Freq: Three times a day (TID) | ORAL | 1 refills | Status: DC

## 2016-06-07 NOTE — Telephone Encounter (Signed)
Updated med list resent rx to [facility]...[name] [name]

## 2016-06-07 NOTE — Telephone Encounter (Signed)
Ok to change and send in.

## 2016-06-07 NOTE — Telephone Encounter (Signed)
Rec'd call pt states she inform MD that she had to increase her Gabapentin 2 pills three times a day, but hasn't been change on med list and needing refill. Was originally rx by oncologist but was told to have PCP fill. Is this ok to change & send refills...Johny Chess

## 2016-06-08 ENCOUNTER — Encounter: Payer: Self-pay | Admitting: Internal Medicine

## 2016-06-08 ENCOUNTER — Ambulatory Visit (INDEPENDENT_AMBULATORY_CARE_PROVIDER_SITE_OTHER): Payer: BC Managed Care – PPO | Admitting: Internal Medicine

## 2016-06-08 VITALS — BP 104/74 | HR 85 | Temp 99.1°F | Resp 16 | Wt 216.0 lb

## 2016-06-08 DIAGNOSIS — R5383 Other fatigue: Secondary | ICD-10-CM | POA: Insufficient documentation

## 2016-06-08 DIAGNOSIS — I10 Essential (primary) hypertension: Secondary | ICD-10-CM

## 2016-06-08 DIAGNOSIS — R5382 Chronic fatigue, unspecified: Secondary | ICD-10-CM | POA: Diagnosis not present

## 2016-06-08 HISTORY — DX: Chronic fatigue, unspecified: R53.82

## 2016-06-08 NOTE — Progress Notes (Signed)
Subjective:    Patient ID: [name] [name], female    DOB: 12/16/1960, 55 y.o.   MRN: [id number]  HPI The patient is here to review her blood work and person. She continues to feel fatigued.  Hypertension: She is taking her medication daily as prescribed. At her last visit we had discussed possibly trying to decrease her blood pressure medications. She has not been monitoring her blood pressure at home consistently because it has been well controlled. She denies any chest pain, edema, headaches and lightheadedness.  She does feel occasional palpitations.  Fatigue: She does not understand why she is still tired. She is taking her B complex daily. She did have some questions regarding her recent blood work.  Medications and allergies reviewed with patient and updated if appropriate.  Patient Active Problem List   Diagnosis Date Noted  . Cough 04/25/2016  . Palpitations 04/04/2016  . 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 Malignant neoplasm of upper-outer quadrant of right female breast ([place]) 11/03/2015  . S/P mastectomy 05/13/2015  . 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  . 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 Genetic testing 12/11/2014  . Hypokalemia 12/10/2014  . Chemotherapy induced neutropenia ([place]) 11/26/2014  . Breast cancer of upper-outer quadrant of right female breast ([place]) 10/23/2014  . Anxiety 04/29/2012  . Allergic rhinitis 11/29/2009  . 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 CONSTIPATION, CHRONIC 08/17/2008  . Dyslipidemia 04/24/2008  . METABOLIC SYNDROME X 99991111  . GERD 01/07/2008  . OBESITY, TRUNCAL 09/20/2007  . Essential hypertension 09/20/2007  . Multiple sclerosis ([place]) 09/18/2007    Current Outpatient Prescriptions on File Prior to Visit  Medication Sig Dispense Refill  . acetaminophen (TYLENOL) 500 MG tablet Take 1,000 mg by mouth 2 (two) times daily as needed (pain). Reported on 09/23/2015      . ALPRAZolam (XANAX) 0.5 MG tablet Take 1 tablet (0.5 mg total) by mouth 2 (two) times daily as needed for anxiety or sleep. 90 tablet 0  . b complex vitamins tablet Take 1 tablet by mouth daily.    . ferrous gluconate (FERGON) 324 MG tablet Take 1 tablet (324 mg total) by mouth 2 (two) times daily with a meal. 180 tablet 3  . fluticasone (FLONASE) 50 MCG/ACT nasal spray Place 2 sprays into both nostrils daily. 16 g 6  . gabapentin (NEURONTIN) 100 MG capsule Take 2 capsules (200 mg total) by mouth 3 (three) times daily. Patient to take 2 capsules twice a day 540 capsule 1  . Glatiramer Acetate (COPAXONE) 40 MG/ML SOSY Inject 40 mg into the skin 3 (three) times a week. 36 Syringe 2  . hydrochlorothiazide (HYDRODIURIL) 25 MG tablet TAKE 1 TABLET DAILY 90 tablet 2  . loratadine-pseudoephedrine (CLARITIN-D 12 HOUR) 5-120 MG tablet Take 1 tablet by mouth daily as needed (seasonal allergies). 30 tablet 3  . losartan (COZAAR) 50 MG tablet Take 1 tablet (50 mg total) by mouth 2 (two) times daily. 180 tablet 3  . metoprolol (LOPRESSOR) 50 MG tablet Take 1 tablet (50 mg total) by mouth 2 (two) times daily. 180 tablet 3  . neomycin-polymyxin-hydrocortisone (CORTISPORIN) otic solution Place 4 drops into the right ear 4 (four) times daily. For 7 days 10 mL 0  . pantoprazole (PROTONIX) 40 MG tablet Take 1 tablet (40 mg total) by mouth daily. 90 tablet 3  . potassium chloride (MICRO-K) 10 MEQ CR capsule Take 2 capsules (20 mEq total) by mouth 2 (two) times daily. 180 capsule 3  . [  DISCONTINUED] mometasone (NASONEX) 50 MCG/ACT nasal spray Place 2 sprays into the nose daily. 17 g 2   No current facility-administered medications on file prior to visit.     Past Medical History:  Diagnosis Date  . Allergic rhinitis due to other allergen   . Allergy   . Angioedema    ACEI  . Anxiety   . Breast cancer of upper-outer quadrant of right female breast ([place]) 10/23/2014   s/p r mastectomy, neoadj chemo completed  04/21/15; neg genetic testing  . Colon polyps 05/2014   adenomatous, colo q 5y  . CONSTIPATION, CHRONIC   . GERD   . Hot flashes   . HYPERLIPIDEMIA   . HYPERTENSION   . Migraine headache   . Multiple sclerosis ([place])   . OBESITY, TRUNCAL     Past Surgical History:  Procedure Laterality Date  . COLONOSCOPY    . PORT-A-CATH REMOVAL Left 05/13/2015   Procedure: REMOVAL PORT-A-CATH;  Surgeon: [name], MD;  Location: [place];  Service: General;  Laterality: Left;  . PORTACATH PLACEMENT Left 11/06/2014   Procedure: INSERTION PORT-A-CATH;  Surgeon: [name], MD;  Location: [place];  Service: General;  Laterality: Left;  . SIMPLE MASTECTOMY WITH AXILLARY SENTINEL NODE BIOPSY Right 05/13/2015   Procedure: RIGHT TOTAL  MASTECTOMY WITH RIGHT  AXILLARY SENTINEL NODE BIOPSY;  Surgeon: [name], MD;  Location: [place];  Service: General;  Laterality: Right;  . TUBAL LIGATION      Social History   Social History  . Marital status: Single    Spouse name: N/A  . Number of children: 2  . Years of education: 12   Occupational History  . [place] History Main Topics  . Smoking status: Never Smoker  . Smokeless tobacco: Never Used  . Alcohol use No  . Drug use: No  . Sexual activity: Not on file   Other Topics Concern  . 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    Patient is single and lives alone.    Patient is right handed.     Family History  Problem Relation Age of Onset  . Coronary artery disease Mother   . Heart disease Mother   . Diabetes Mother   . Hypertension Other   . Hyperlipidemia Other   . Diabetes Other   . Colon cancer Neg Hx   . Alcohol abuse Father   . COPD Maternal Grandmother     Review of Systems  Constitutional: Positive for fever (low grade today, but denies other fever). Negative  for appetite change.  HENT: Negative for ear pain and sore throat.   Respiratory: Negative for cough, shortness of breath and wheezing.   Cardiovascular: Negative for chest pain, palpitations and leg swelling.  Neurological: Negative for light-headedness and headaches.       Objective:   Vitals:   06/08/16 0953  BP: 104/74  Pulse: 85  Resp: 16  Temp: 99.1 F (37.3 C)   Filed Weights   06/08/16 0953  Weight: 216 lb (98 kg)   Body mass index is 37.08 kg/m.   Physical Exam    Constitutional: Appears well-developed and well-nourished. No distress.  Skin: Skin is warm and dry. Not diaphoretic.  Psychiatric: Normal mood and affect. Behavior is normal.     Assessment & Plan:    See Problem List for  Assessment and Plan of chronic medical problems.    15 minutes were spent face-to-face with the patient, over 50% of which was spent counseling regarding blood work results and adjusting blood pressure medication

## 2016-06-08 NOTE — Progress Notes (Signed)
Pre visit review using our clinic review tool, if applicable. No additional management support is needed unless otherwise documented below in the visit note. 

## 2016-06-08 NOTE — Assessment & Plan Note (Signed)
BP well controlled - agree with trying to decrease medication Metoprolol may be contributing to fatigue  Will try decreasing metoprolol to 25 mg twice daily - advised this may cause some increased palpitation or elevated HR which should improve after a couple of days - if it does not may need to increase back up to 50 mg BID and try decreasing losartan monitor BP at home

## 2016-06-08 NOTE — Assessment & Plan Note (Addendum)
Last blood work reviewed with her in detail Start vitamin D3 1000 units daily Decrease metoprolol Has been tested for sleep apnea in the past - it was neg Start regular exercise Sleep is not good and may be contributing to fatigue - restart melatonin

## 2016-06-08 NOTE — Patient Instructions (Addendum)
Start taking vitamin d 3 1000 units daily  Try melatonin again for sleep.   Try to start exercising  Decrease metoprolol to 25 mg twice daily.  You may have increased palpitations for a few days.  Monitor your BP - goal is less than 140/90 on average.

## 2016-06-12 ENCOUNTER — Telehealth: Payer: Self-pay | Admitting: *Deleted

## 2016-06-12 DIAGNOSIS — C50411 Malignant neoplasm of upper-outer quadrant of right female breast: Secondary | ICD-10-CM

## 2016-06-12 MED ORDER — GABAPENTIN 100 MG PO CAPS: 200.0000 mg | ORAL_CAPSULE | Freq: Three times a day (TID) | ORAL | 1 refills | Status: DC

## 2016-06-12 NOTE — Telephone Encounter (Signed)
Rec'd call pt states [facility] stated they never received rx for the Neurontin. She would like rx sent to [facility] because she is needing med today. Inform pt not sure why [facility] have not receive, but it was actually called into them. Will resend rx to local pharmacy...Johny Chess

## 2016-06-23 ENCOUNTER — Encounter: Payer: Self-pay | Admitting: Internal Medicine

## 2016-06-23 NOTE — Telephone Encounter (Signed)
Please advise 

## 2016-06-27 ENCOUNTER — Encounter: Payer: Self-pay | Admitting: Internal Medicine

## 2016-06-29 MED ORDER — METOPROLOL TARTRATE 50 MG PO TABS: 25.0000 mg | ORAL_TABLET | Freq: Two times a day (BID) | ORAL | 3 refills | Status: DC

## 2016-07-06 ENCOUNTER — Other Ambulatory Visit: Payer: Self-pay | Admitting: Neurology

## 2016-07-06 DIAGNOSIS — C50411 Malignant neoplasm of upper-outer quadrant of right female breast: Secondary | ICD-10-CM

## 2016-07-14 ENCOUNTER — Other Ambulatory Visit: Payer: Self-pay | Admitting: Internal Medicine

## 2016-08-09 ENCOUNTER — Other Ambulatory Visit: Payer: Self-pay | Admitting: Emergency Medicine

## 2016-08-09 MED ORDER — PANTOPRAZOLE SODIUM 40 MG PO TBEC: 40.0000 mg | DELAYED_RELEASE_TABLET | Freq: Every day | ORAL | 1 refills | Status: DC

## 2016-08-17 ENCOUNTER — Other Ambulatory Visit: Payer: Self-pay | Admitting: Internal Medicine

## 2016-08-18 ENCOUNTER — Ambulatory Visit: Payer: BC Managed Care – PPO | Admitting: Internal Medicine

## 2016-08-24 ENCOUNTER — Encounter: Payer: Self-pay | Admitting: Internal Medicine

## 2016-08-24 ENCOUNTER — Ambulatory Visit (INDEPENDENT_AMBULATORY_CARE_PROVIDER_SITE_OTHER): Payer: BC Managed Care – PPO | Admitting: Internal Medicine

## 2016-08-24 VITALS — BP 130/82 | HR 74 | Temp 98.0°F | Resp 16 | Wt 222.0 lb

## 2016-08-24 DIAGNOSIS — N644 Mastodynia: Secondary | ICD-10-CM | POA: Insufficient documentation

## 2016-08-24 DIAGNOSIS — C50411 Malignant neoplasm of upper-outer quadrant of right female breast: Secondary | ICD-10-CM

## 2016-08-24 MED ORDER — GABAPENTIN 100 MG PO CAPS: 200.0000 mg | ORAL_CAPSULE | Freq: Three times a day (TID) | ORAL | 1 refills | Status: DC

## 2016-08-24 NOTE — Patient Instructions (Addendum)
I will contact Dr Jana Hakim.

## 2016-08-24 NOTE — Progress Notes (Signed)
Subjective:    Patient ID: [name] [name], female    DOB: 1961/02/12, 55 y.o.   MRN: [id number]  HPI She is here for an acute visit.   She has a history of right sided breast cancer, dx 2016:  She is s/p neoadjuvant chemo and mastectomy.  The area where the cancer was (medial aspect) has been slightly tender since surgery.   She wears a prosthesis daily, except on the weekends. Since starting to wear the prosthesis she has had increased pain on the medical aspect and lateral aspect of her breast.  The pain has gotten worse recently and is causing her to feel depressed.  She takes tylenol at times, but does not want to take it regularly.  She has tried her other prostheses and bras and they all cause pain.   She denies changes in her skin.  Taking off her bra/prosthesis relives most of the pain.  She still has some tenderness in these areas with palpation.  She does not want to go without wearing a prosthesis.   She is unsure what to do.  She has follow up appt's with Dr Jana Hakim and Dr Donne Hazel next month - she did not want to see them sooner because she can not pay the copay.    Medications and allergies reviewed with patient and updated if appropriate.  Patient Active Problem List   Diagnosis Date Noted  . Chronic fatigue 06/08/2016  . Cough 04/25/2016  . Palpitations 04/04/2016  . 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 Malignant neoplasm of upper-outer quadrant of right female breast ([place]) 11/03/2015  . S/P mastectomy 05/13/2015  . 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  . 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 Genetic testing 12/11/2014  . Hypokalemia 12/10/2014  . Chemotherapy induced neutropenia ([place]) 11/26/2014  . Breast cancer of upper-outer quadrant of right female breast ([place]) 10/23/2014  . Anxiety 04/29/2012  . Allergic rhinitis 11/29/2009  . 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 CONSTIPATION, CHRONIC 08/17/2008    . Dyslipidemia 04/24/2008  . METABOLIC SYNDROME X 99991111  . GERD 01/07/2008  . OBESITY, TRUNCAL 09/20/2007  . Essential hypertension 09/20/2007  . Multiple sclerosis ([place]) 09/18/2007    Current Outpatient Prescriptions on File Prior to Visit  Medication Sig Dispense Refill  . acetaminophen (TYLENOL) 500 MG tablet Take 1,000 mg by mouth 2 (two) times daily as needed (pain). Reported on 09/23/2015    . ALPRAZolam (XANAX) 0.5 MG tablet Take 1 tablet (0.5 mg total) by mouth 2 (two) times daily as needed for anxiety or sleep. 90 tablet 0  . b complex vitamins tablet Take 1 tablet by mouth daily.    [name] COPAXONE 40 MG/ML SOSY INJECT 40MG  SUBCUTANEOUSLY THREE TIMES A WEEK 36 Syringe 0  . ferrous gluconate (FERGON) 324 MG tablet Take 1 tablet (324 mg total) by mouth 2 (two) times daily with a meal. 180 tablet 3  . fluticasone (FLONASE) 50 MCG/ACT nasal spray USE 2 SPRAYS IN EACH       NOSTRIL DAILY 16 g 6  . gabapentin (NEURONTIN) 100 MG capsule Take 2 capsules (200 mg total) by mouth 3 (three) times daily. 540 capsule 1  . hydrochlorothiazide (HYDRODIURIL) 25 MG tablet TAKE 1 TABLET DAILY 90 tablet 2  . KLOR-CON SPRINKLE 10 MEQ CR capsule TAKE 2 CAPSULES (20MEQ     TOTAL) TWICE DAILY 180 capsule 1  . loratadine-pseudoephedrine (CLARITIN-D 12 HOUR) 5-120 MG tablet Take 1 tablet by mouth daily as needed (seasonal  allergies). 30 tablet 3  . losartan (COZAAR) 50 MG tablet Take 1 tablet (50 mg total) by mouth 2 (two) times daily. 180 tablet 3  . metoprolol (LOPRESSOR) 50 MG tablet Take 0.5 tablets (25 mg total) by mouth 2 (two) times daily. 180 tablet 3  . pantoprazole (PROTONIX) 40 MG tablet Take 1 tablet (40 mg total) by mouth daily. 90 tablet 1  . [DISCONTINUED] mometasone (NASONEX) 50 MCG/ACT nasal spray Place 2 sprays into the nose daily. 17 g 2   No current facility-administered medications on file prior to visit.     Past Medical History:  Diagnosis Date  . Allergic rhinitis due to other  allergen   . Allergy   . Angioedema    ACEI  . Anxiety   . Breast cancer of upper-outer quadrant of right female breast ([place]) 10/23/2014   s/p r mastectomy, neoadj chemo completed 04/21/15; neg genetic testing  . Colon polyps 05/2014   adenomatous, colo q 5y  . CONSTIPATION, CHRONIC   . GERD   . Hot flashes   . HYPERLIPIDEMIA   . HYPERTENSION   . Migraine headache   . Multiple sclerosis ([place])   . OBESITY, TRUNCAL     Past Surgical History:  Procedure Laterality Date  . COLONOSCOPY    . PORT-A-CATH REMOVAL Left 05/13/2015   Procedure: REMOVAL PORT-A-CATH;  Surgeon: [name], MD;  Location: [facility];  Service: General;  Laterality: Left;  . PORTACATH PLACEMENT Left 11/06/2014   Procedure: INSERTION PORT-A-CATH;  Surgeon: [name], MD;  Location: [facility];  Service: General;  Laterality: Left;  . SIMPLE MASTECTOMY WITH AXILLARY SENTINEL NODE BIOPSY Right 05/13/2015   Procedure: RIGHT TOTAL  MASTECTOMY WITH RIGHT  AXILLARY SENTINEL NODE BIOPSY;  Surgeon: [name], MD;  Location: [facility];  Service: General;  Laterality: Right;  . TUBAL LIGATION      Social History   Social History  . Marital status: Single    Spouse name: N/A  . Number of children: 2  . Years of education: 12   Occupational History  . [place] History Main Topics  . Smoking status: Never Smoker  . Smokeless tobacco: Never Used  . Alcohol use No  . Drug use: No  . Sexual activity: Not on file   Other Topics Concern  . 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    Patient is single and lives alone.    Patient is right handed.     Family History  Problem Relation Age of Onset  . Coronary artery disease Mother   . Heart disease Mother   . Diabetes Mother   . Hypertension Other   . Hyperlipidemia Other   . Diabetes Other   . Colon  cancer Neg Hx   . Alcohol abuse Father   . COPD Maternal Grandmother     Review of Systems  Constitutional: Negative for chills and fever.  Skin: Negative for color change, rash and wound.  Neurological: Positive for numbness (mild in right breast from surgery - unchanged since surgery).       Objective:   Vitals:   08/24/16 0905  BP: 130/82  Pulse: 74  Resp: 16  Temp: 98 F (36.7 C)   Filed Weights   08/24/16 0905  Weight: 222 lb (100.7 kg)   Body mass index is 38.11 kg/m.  Physical Exam  Constitutional: She appears well-developed and well-nourished. No distress.  Pulmonary/Chest:  S/p right mastectomy.  Tenderness in medial and lateral aspects of breast w/o lumps.  skin is normal - no erythema or abnormal swelling.    Skin: Skin is warm and dry. No rash noted. She is not diaphoretic. No erythema. No pallor.          Assessment & Plan:   See Problem List for Assessment and Plan of chronic medical problems.

## 2016-08-24 NOTE — Assessment & Plan Note (Signed)
Has mild chronic pain in medial aspect since mastectomy - worse with wearing prosthesis - prostheses is causing significant discomfort in medial and lateral aspects - different prosthesis cause same pain ? Related to pressure on those areas - ? Skin tissue or nerve damage Does not want to go w/o prosthesis Does not want medication to cover up pain -- wants to get rid of pain,  Not just a Band-Aid Will reach out to Dr Jana Hakim  - I am not sure how I can help

## 2016-08-24 NOTE — Progress Notes (Signed)
Pre visit review using our clinic review tool, if applicable. No additional management support is needed unless otherwise documented below in the visit note. 

## 2016-08-25 ENCOUNTER — Other Ambulatory Visit: Payer: Self-pay | Admitting: Oncology

## 2016-08-26 ENCOUNTER — Encounter: Payer: Self-pay | Admitting: Internal Medicine

## 2016-08-29 ENCOUNTER — Other Ambulatory Visit: Payer: Self-pay | Admitting: *Deleted

## 2016-08-29 MED ORDER — POTASSIUM CHLORIDE ER 10 MEQ PO CPCR: ORAL_CAPSULE | ORAL | 1 refills | Status: DC

## 2016-08-29 NOTE — Telephone Encounter (Signed)
Rec'd call pt is needing her [facility]...[name] [name]

## 2016-09-01 ENCOUNTER — Encounter: Payer: Self-pay | Admitting: Internal Medicine

## 2016-09-01 ENCOUNTER — Ambulatory Visit (INDEPENDENT_AMBULATORY_CARE_PROVIDER_SITE_OTHER): Payer: BC Managed Care – PPO | Admitting: Internal Medicine

## 2016-09-01 VITALS — BP 134/86 | HR 105 | Temp 98.4°F | Resp 16

## 2016-09-01 DIAGNOSIS — F32A Depression, unspecified: Secondary | ICD-10-CM

## 2016-09-01 DIAGNOSIS — F329 Major depressive disorder, single episode, unspecified: Secondary | ICD-10-CM

## 2016-09-01 DIAGNOSIS — F419 Anxiety disorder, unspecified: Secondary | ICD-10-CM | POA: Diagnosis not present

## 2016-09-01 DIAGNOSIS — C50411 Malignant neoplasm of upper-outer quadrant of right female breast: Secondary | ICD-10-CM

## 2016-09-01 DIAGNOSIS — R413 Other amnesia: Secondary | ICD-10-CM

## 2016-09-01 DIAGNOSIS — N644 Mastodynia: Secondary | ICD-10-CM | POA: Diagnosis not present

## 2016-09-01 NOTE — Progress Notes (Signed)
Pre visit review using our clinic review tool, if applicable. No additional management support is needed unless otherwise documented below in the visit note. 

## 2016-09-01 NOTE — Patient Instructions (Signed)
   Medications reviewed and updated.  Changes include increasing gabapentin to 300 mg at night.

## 2016-09-01 NOTE — Progress Notes (Signed)
Subjective:    Patient ID: [name] [name], female    DOB: [birth year], 55 y.o.   MRN: [id number]  HPI  She is here for an acute visit.   She is experiencing some depression and memory issues.   Depression/anxiety:  She has a lot of stress at work.  This is causing some of her depression and anxiety.  Most of it is related to the people she works with.  She does not feel depressed all the time.  She has xanax and rarely takes it -- usually 2/year.  She does not want to take any other medication.  Her family does not understand everything she has been through with the cancer.  She feels church is her rock and she is active with church.  She is currently not exercising.  Memory concerns:  She has had issues with her memory, especially since being treated for her cancer.  She can forget things that people tell her.  She is concerned about developing dementia.  She is taking her vitamins daily.  She does not exercise.  She has no difficulty doing her work, managing her money.    Breast pain:  She is still having the breast pain.  She is taking her gabapentin.  She did not get my mychart message about possibly increasing the dose, but is willing to try it.   Medications and allergies reviewed with patient and updated if appropriate.  Patient Active Problem List   Diagnosis Date Noted  . Breast pain, right 08/24/2016  . Chronic fatigue 06/08/2016  . Cough 04/25/2016  . Palpitations 04/04/2016  . 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 Malignant neoplasm of upper-outer quadrant of right female breast ([place]) 11/03/2015  . S/P mastectomy 05/13/2015  . 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  . 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 Genetic testing 12/11/2014  . Hypokalemia 12/10/2014  . Chemotherapy induced neutropenia ([place]) 11/26/2014  . Breast cancer of upper-outer quadrant of right female breast ([place]) 10/23/2014  .  Anxiety 04/29/2012  . Allergic rhinitis 11/29/2009  . 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 CONSTIPATION, CHRONIC 08/17/2008  . Dyslipidemia 04/24/2008  . METABOLIC SYNDROME X 99991111  . GERD 01/07/2008  . OBESITY, TRUNCAL 09/20/2007  . Essential hypertension 09/20/2007  . Multiple sclerosis ([place]) 09/18/2007    Current Outpatient Prescriptions on File Prior to Visit  Medication Sig Dispense Refill  . acetaminophen (TYLENOL) 500 MG tablet Take 1,000 mg by mouth 2 (two) times daily as needed (pain). Reported on 09/23/2015    . ALPRAZolam (XANAX) 0.5 MG tablet Take 1 tablet (0.5 mg total) by mouth 2 (two) times daily as needed for anxiety or sleep. 90 tablet 0  . b complex vitamins tablet Take 1 tablet by mouth daily.    [name] COPAXONE 40 MG/ML SOSY INJECT 40MG  SUBCUTANEOUSLY THREE TIMES A WEEK 36 Syringe 0  . ferrous gluconate (FERGON) 324 MG tablet Take 1 tablet (324 mg total) by mouth 2 (two) times daily with a meal. 180 tablet 3  . fluticasone (FLONASE) 50 MCG/ACT nasal spray USE 2 SPRAYS IN EACH       NOSTRIL DAILY 16 g 6  . gabapentin (NEURONTIN) 100 MG capsule Take 2 capsules (200 mg total) by mouth 3 (three) times daily. 540 capsule 1  . hydrochlorothiazide (HYDRODIURIL) 25 MG tablet TAKE 1 TABLET DAILY 90 tablet 2  . loratadine-pseudoephedrine (CLARITIN-D 12 HOUR) 5-120 MG tablet Take 1 tablet by mouth daily as needed (seasonal allergies). 30 tablet  3  . losartan (COZAAR) 50 MG tablet Take 1 tablet (50 mg total) by mouth 2 (two) times daily. 180 tablet 3  . metoprolol (LOPRESSOR) 50 MG tablet Take 0.5 tablets (25 mg total) by mouth 2 (two) times daily. 180 tablet 3  . pantoprazole (PROTONIX) 40 MG tablet Take 1 tablet (40 mg total) by mouth daily. 90 tablet 1  . potassium chloride (KLOR-CON SPRINKLE) 10 MEQ CR capsule TAKE 2 CAPSULES (20MEQ     TOTAL) TWICE DAILY 180 capsule 1  . [DISCONTINUED] mometasone (NASONEX) 50 MCG/ACT nasal spray Place 2 sprays into the nose daily. 17 g 2   No current  facility-administered medications on file prior to visit.     Past Medical History:  Diagnosis Date  . Allergic rhinitis due to other allergen   . Allergy   . Angioedema    ACEI  . Anxiety   . Breast cancer of upper-outer quadrant of right female breast ([place]) 10/23/2014   s/p r mastectomy, neoadj chemo completed 04/21/15; neg genetic testing  . Colon polyps 05/2014   adenomatous, colo q 5y  . CONSTIPATION, CHRONIC   . GERD   . Hot flashes   . HYPERLIPIDEMIA   . HYPERTENSION   . Migraine headache   . Multiple sclerosis ([place])   . OBESITY, TRUNCAL     Past Surgical History:  Procedure Laterality Date  . COLONOSCOPY    . PORT-A-CATH REMOVAL Left 05/13/2015   Procedure: REMOVAL PORT-A-CATH;  Surgeon: [name], MD;  Location: [facility];  Service: General;  Laterality: Left;  . PORTACATH PLACEMENT Left 11/06/2014   Procedure: INSERTION PORT-A-CATH;  Surgeon: [name], MD;  Location: [facility];  Service: General;  Laterality: Left;  . SIMPLE MASTECTOMY WITH AXILLARY SENTINEL NODE BIOPSY Right 05/13/2015   Procedure: RIGHT TOTAL  MASTECTOMY WITH RIGHT  AXILLARY SENTINEL NODE BIOPSY;  Surgeon: [name], MD;  Location: [facility];  Service: General;  Laterality: Right;  . TUBAL LIGATION      Social History   Social History  . Marital status: Single    Spouse name: N/A  . Number of children: 2  . Years of education: 12   Occupational History  . [place] History Main Topics  . Smoking status: Never Smoker  . Smokeless tobacco: Never Used  . Alcohol use No  . Drug use: No  . Sexual activity: Not Asked   Other Topics Concern  . 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    Patient is single and lives alone.    Patient is right handed.     Family History  Problem Relation Age of Onset  . Coronary artery disease Mother    . Heart disease Mother   . Diabetes Mother   . Hypertension Other   . Hyperlipidemia Other   . Diabetes Other   . Colon cancer Neg Hx   . Alcohol abuse Father   . COPD Maternal Grandmother     Review of Systems  Constitutional: Negative for chills and fever.  Respiratory: Negative for shortness of breath.   Cardiovascular: Negative for chest pain, palpitations and leg swelling.  Neurological: Positive for headaches (occ). Negative for light-headedness.  Psychiatric/Behavioral: Positive for dysphoric mood. Negative for confusion, sleep disturbance and suicidal ideas. The patient is nervous/anxious.        Objective:   Vitals:  09/01/16 1432  BP: 134/86  Pulse: (!) 105  Resp: 16  Temp: 98.4 F (36.9 C)   There were no vitals filed for this visit. There is no height or weight on file to calculate BMI.   Physical Exam  Constitutional: She appears well-developed and well-nourished. No distress.  Skin: She is not diaphoretic.  Psychiatric: Her behavior is normal. Judgment and thought content normal.  Depressed affect at times          Assessment & Plan:   See Problem List for Assessment and Plan of chronic medical problems.

## 2016-09-03 DIAGNOSIS — F32A Depression, unspecified: Secondary | ICD-10-CM | POA: Insufficient documentation

## 2016-09-03 DIAGNOSIS — R413 Other amnesia: Secondary | ICD-10-CM | POA: Insufficient documentation

## 2016-09-03 DIAGNOSIS — F329 Major depressive disorder, single episode, unspecified: Secondary | ICD-10-CM | POA: Insufficient documentation

## 2016-09-03 HISTORY — DX: Depression, unspecified: F32.A

## 2016-09-03 MED ORDER — GABAPENTIN 100 MG PO CAPS: ORAL_CAPSULE | ORAL | 1 refills | Status: DC

## 2016-09-03 NOTE — Assessment & Plan Note (Signed)
Increase gabapentin to 300 mg at night Continue 200 mg twice during the day Can increase further if tolerated Will f/u with oncology

## 2016-09-03 NOTE — Assessment & Plan Note (Signed)
Blood work in recent past show no cause May be multifactorial, her cancer treatment and multiple sclerosis may be contributing, but her anxiety and depression are very likely contributing Can discuss with neurology Reassured her she is not developing dementia Work on depression/anxiety

## 2016-09-03 NOTE — Assessment & Plan Note (Signed)
Intermittent Related to increased stress at work No suicidal thoughts Does not want medication Discussed support groups, therapy Has good support group Encouraged regular exercise Discussed natural ways to improve mood

## 2016-09-03 NOTE — Assessment & Plan Note (Signed)
Increased stress, some anxiety Uses xanax rarely, but may need to use more often - avoid regular use Does not want a daily med/ SSRI Encouraged regular exercise Discussed seeing a therapist Has good support

## 2016-09-18 HISTORY — PX: COLONOSCOPY: SHX174

## 2016-09-21 ENCOUNTER — Telehealth: Payer: Self-pay | Admitting: *Deleted

## 2016-09-21 NOTE — Telephone Encounter (Signed)
Can take claritin (regular claritin, not claritin D).  She can also try flonase nasal spray.

## 2016-09-21 NOTE — Telephone Encounter (Signed)
Notified pt w/MD response.../lmb 

## 2016-09-21 NOTE — Telephone Encounter (Signed)
Rec'd call pt states she have this cough and nasal drainage. No color to phlegm she states its just clear. Been using Delsym and alka selzer plus. Pt is wanting to know what she can take for the nasal drainage. Nose is constantly running...Johny Chess

## 2016-09-22 ENCOUNTER — Ambulatory Visit (INDEPENDENT_AMBULATORY_CARE_PROVIDER_SITE_OTHER): Payer: BC Managed Care – PPO | Admitting: Internal Medicine

## 2016-09-22 VITALS — BP 124/86 | HR 79 | Temp 98.4°F | Resp 16 | Wt 222.0 lb

## 2016-09-22 DIAGNOSIS — J069 Acute upper respiratory infection, unspecified: Secondary | ICD-10-CM | POA: Diagnosis not present

## 2016-09-22 MED ORDER — HYDROCODONE-HOMATROPINE 5-1.5 MG/5ML PO SYRP: 5.0000 mL | ORAL_SOLUTION | Freq: Three times a day (TID) | ORAL | 0 refills | Status: DC | PRN

## 2016-09-22 MED ORDER — AZITHROMYCIN 250 MG PO TABS: ORAL_TABLET | ORAL | 0 refills | Status: DC

## 2016-09-22 NOTE — Patient Instructions (Signed)

## 2016-09-22 NOTE — Progress Notes (Signed)
Subjective:    Patient ID: [name] [name], female    DOB: 01-17-1961, 56 y.o.   MRN: [id number]  HPI She is here for an acute visit for cold symptoms.   Her symptoms started two weeks ago. She has had some fevers, nasal congestion with discolored mucus, sore throat and a productive cough. Her phlegm and mucus is yellow-green in color. She does not feel that her symptoms are improving, which is why she is here today. She denies ear pain, sinus pain, shortness of breath, wheeze, nausea, diarrhea, body aches and headaches/lightheadedness.  She has tried taking Alka-Seltzer cold, Delsym cough syrup and she did have a few leftover antibiotics and she does not recall the name of. She believes this did help slightly - she only had a few pills.     Medications and allergies reviewed with patient and updated if appropriate.  Patient Active Problem List   Diagnosis Date Noted  . Depression 09/03/2016  . Memory difficulties 09/03/2016  . Breast pain, right 08/24/2016  . Chronic fatigue 06/08/2016  . Cough 04/25/2016  . Palpitations 04/04/2016  . 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 Malignant neoplasm of upper-outer quadrant of right female breast ([place]) 11/03/2015  . S/P mastectomy 05/13/2015  . Chemotherapy-induced neuropathy ([place]) 04/21/2015  . 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  . 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 Genetic testing 12/11/2014  . Chemotherapy induced neutropenia ([place]) 11/26/2014  . Breast cancer of upper-outer quadrant of right female breast ([place]) 10/23/2014  . Anxiety 04/29/2012  . Allergic rhinitis 11/29/2009  . 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 CONSTIPATION, CHRONIC 08/17/2008  . Dyslipidemia 04/24/2008  . METABOLIC SYNDROME X 99991111  . GERD 01/07/2008  . OBESITY, TRUNCAL 09/20/2007  . Essential hypertension 09/20/2007  . Multiple sclerosis ([place]) 09/18/2007    Current Outpatient Prescriptions on File Prior to Visit  Medication Sig  Dispense Refill  . acetaminophen (TYLENOL) 500 MG tablet Take 1,000 mg by mouth 2 (two) times daily as needed (pain). Reported on 09/23/2015    . ALPRAZolam (XANAX) 0.5 MG tablet Take 1 tablet (0.5 mg total) by mouth 2 (two) times daily as needed for anxiety or sleep. 90 tablet 0  . b complex vitamins tablet Take 1 tablet by mouth daily.    [name] COPAXONE 40 MG/ML SOSY INJECT 40MG  SUBCUTANEOUSLY THREE TIMES A WEEK 36 Syringe 0  . ferrous gluconate (FERGON) 324 MG tablet Take 1 tablet (324 mg total) by mouth 2 (two) times daily with a meal. 180 tablet 3  . fluticasone (FLONASE) 50 MCG/ACT nasal spray USE 2 SPRAYS IN EACH       NOSTRIL DAILY 16 g 6  . gabapentin (NEURONTIN) 100 MG capsule 200 mg in morning and afternoon, 300 mg at night 540 capsule 1  . hydrochlorothiazide (HYDRODIURIL) 25 MG tablet TAKE 1 TABLET DAILY 90 tablet 2  . loratadine-pseudoephedrine (CLARITIN-D 12 HOUR) 5-120 MG tablet Take 1 tablet by mouth daily as needed (seasonal allergies). 30 tablet 3  . losartan (COZAAR) 50 MG tablet Take 1 tablet (50 mg total) by mouth 2 (two) times daily. 180 tablet 3  . metoprolol (LOPRESSOR) 50 MG tablet Take 0.5 tablets (25 mg total) by mouth 2 (two) times daily. 180 tablet 3  . pantoprazole (PROTONIX) 40 MG tablet Take 1 tablet (40 mg total) by mouth daily. 90 tablet 1  . potassium chloride (KLOR-CON SPRINKLE) 10 MEQ CR capsule TAKE 2 CAPSULES (20MEQ     TOTAL) TWICE DAILY 180 capsule 1  . [DISCONTINUED] mometasone (  NASONEX) 50 MCG/ACT nasal spray Place 2 sprays into the nose daily. 17 g 2   No current facility-administered medications on file prior to visit.     Past Medical History:  Diagnosis Date  . Allergic rhinitis due to other allergen   . Allergy   . Angioedema    ACEI  . Anxiety   . Breast cancer of upper-outer quadrant of right female breast ([place]) 10/23/2014   s/p r mastectomy, neoadj chemo completed 04/21/15; neg genetic testing  . Colon polyps 05/2014   adenomatous, colo q 5y  .  CONSTIPATION, CHRONIC   . GERD   . Hot flashes   . HYPERLIPIDEMIA   . HYPERTENSION   . Migraine headache   . Multiple sclerosis ([place])   . OBESITY, TRUNCAL     Past Surgical History:  Procedure Laterality Date  . COLONOSCOPY    . PORT-A-CATH REMOVAL Left 05/13/2015   Procedure: REMOVAL PORT-A-CATH;  Surgeon: [name], MD;  Location: [facility];  Service: General;  Laterality: Left;  . PORTACATH PLACEMENT Left 11/06/2014   Procedure: INSERTION PORT-A-CATH;  Surgeon: [name], MD;  Location: [facility];  Service: General;  Laterality: Left;  . SIMPLE MASTECTOMY WITH AXILLARY SENTINEL NODE BIOPSY Right 05/13/2015   Procedure: RIGHT TOTAL  MASTECTOMY WITH RIGHT  AXILLARY SENTINEL NODE BIOPSY;  Surgeon: [name], MD;  Location: [facility];  Service: General;  Laterality: Right;  . TUBAL LIGATION      Social History   Social History  . Marital status: Single    Spouse name: N/A  . Number of children: 2  . Years of education: 12   Occupational History  . [place] History Main Topics  . Smoking status: Never Smoker  . Smokeless tobacco: Never Used  . Alcohol use No  . Drug use: No  . Sexual activity: Not on file   Other Topics Concern  . 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    Patient is single and lives alone.    Patient is right handed.     Family History  Problem Relation Age of Onset  . Coronary artery disease Mother   . Heart disease Mother   . Diabetes Mother   . Hypertension Other   . Hyperlipidemia Other   . Diabetes Other   . Colon cancer Neg Hx   . Alcohol abuse Father   . COPD Maternal Grandmother     Review of Systems  Constitutional: Positive for fever (once).  HENT: Positive for congestion and sore throat. Negative for ear pain, sinus pain and sinus pressure.   Respiratory:  Positive for cough. Negative for shortness of breath and wheezing.   Gastrointestinal: Negative for diarrhea and nausea.  Musculoskeletal: Negative for myalgias.  Neurological: Negative for light-headedness and headaches.       Objective:   Vitals:   09/22/16 1622  BP: 124/86  Pulse: 79  Resp: 16  Temp: 98.4 F (36.9 C)   Filed Weights   09/22/16 1622  Weight: 222 lb (100.7 kg)   Body mass index is 38.11 kg/m.  Wt Readings from Last 3 Encounters:  09/22/16 222 lb (100.7 kg)  08/24/16 222 lb (100.7 kg)  06/08/16 216 lb (98 kg)     Physical Exam GENERAL APPEARANCE: Appears stated age, well appearing, NAD EYES: conjunctiva clear, no icterus HEENT: bilateral  tympanic membranes and ear canals normal, oropharynx with mild erythema, no thyromegaly, trachea midline, no cervical or supraclavicular lymphadenopathy LUNGS: Clear to auscultation without wheeze or crackles, unlabored breathing, good air entry bilaterally HEART: Normal S1,S2 without murmurs EXTREMITIES: Without clubbing, cyanosis, or edema        Assessment & Plan:   See Problem List for Assessment and Plan of chronic medical problems.

## 2016-09-22 NOTE — Progress Notes (Signed)
Pre visit review using our clinic review tool, if applicable. No additional management support is needed unless otherwise documented below in the visit note. 

## 2016-09-23 ENCOUNTER — Encounter: Payer: Self-pay | Admitting: Internal Medicine

## 2016-09-23 DIAGNOSIS — J069 Acute upper respiratory infection, unspecified: Secondary | ICD-10-CM

## 2016-09-23 HISTORY — DX: Acute upper respiratory infection, unspecified: J06.9

## 2016-09-23 NOTE — Assessment & Plan Note (Signed)
Present for 2 weeks, likely bacterial She did take a few antibiotic pills she had at home and it seemed to help Continue over-the-counter cold medications for symptom relief Start Z-Pak Hycodan cough syrup as needed Rest, fluids Call if no improvement or with any questions

## 2016-10-04 ENCOUNTER — Other Ambulatory Visit: Payer: Self-pay | Admitting: Neurology

## 2016-10-04 DIAGNOSIS — C50411 Malignant neoplasm of upper-outer quadrant of right female breast: Secondary | ICD-10-CM

## 2016-10-06 ENCOUNTER — Encounter: Payer: Self-pay | Admitting: Internal Medicine

## 2016-10-06 MED ORDER — AZITHROMYCIN 250 MG PO TABS: ORAL_TABLET | ORAL | 0 refills | Status: DC

## 2016-10-10 ENCOUNTER — Other Ambulatory Visit: Payer: Self-pay | Admitting: *Deleted

## 2016-10-10 DIAGNOSIS — C50411 Malignant neoplasm of upper-outer quadrant of right female breast: Secondary | ICD-10-CM

## 2016-10-12 ENCOUNTER — Other Ambulatory Visit: Payer: Self-pay | Admitting: Emergency Medicine

## 2016-10-12 ENCOUNTER — Encounter: Payer: Self-pay | Admitting: Internal Medicine

## 2016-10-12 MED ORDER — LOSARTAN POTASSIUM 50 MG PO TABS: 50.0000 mg | ORAL_TABLET | Freq: Two times a day (BID) | ORAL | 1 refills | Status: DC

## 2016-10-15 ENCOUNTER — Other Ambulatory Visit: Payer: Self-pay | Admitting: Neurology

## 2016-10-15 DIAGNOSIS — C50411 Malignant neoplasm of upper-outer quadrant of right female breast: Secondary | ICD-10-CM

## 2016-10-16 NOTE — Telephone Encounter (Signed)
Pt needs an appt for further  refills 

## 2016-10-19 ENCOUNTER — Other Ambulatory Visit: Payer: Self-pay | Admitting: *Deleted

## 2016-10-19 DIAGNOSIS — C50411 Malignant neoplasm of upper-outer quadrant of right female breast: Secondary | ICD-10-CM

## 2016-10-19 MED ORDER — GABAPENTIN 100 MG PO CAPS: ORAL_CAPSULE | ORAL | 1 refills | Status: DC

## 2016-10-19 NOTE — Telephone Encounter (Signed)
Rec'd call from [facility] needing to verify directions for Gabapentin. They have 2 different sig. Inform Amisha/pharmacist as of date 09/03/16 MD has Gabapentin 100 mg to take 200 mg am and 200 pm, then 300 mg at bedtime. Will have to give quantity #630 for 90 day supply. Updated med list.../lmb

## 2016-10-21 ENCOUNTER — Ambulatory Visit (INDEPENDENT_AMBULATORY_CARE_PROVIDER_SITE_OTHER): Payer: BC Managed Care – PPO | Admitting: Internal Medicine

## 2016-10-21 ENCOUNTER — Encounter: Payer: Self-pay | Admitting: Internal Medicine

## 2016-10-21 VITALS — BP 132/86 | HR 82 | Temp 98.4°F | Resp 16 | Wt 221.0 lb

## 2016-10-21 DIAGNOSIS — T148XXA Other injury of unspecified body region, initial encounter: Secondary | ICD-10-CM | POA: Diagnosis not present

## 2016-10-21 HISTORY — DX: Other injury of unspecified body region, initial encounter: T14.8XXA

## 2016-10-21 MED ORDER — IBUPROFEN 800 MG PO TABS: 800.0000 mg | ORAL_TABLET | Freq: Three times a day (TID) | ORAL | 0 refills | Status: DC | PRN

## 2016-10-21 MED ORDER — CYCLOBENZAPRINE HCL 5 MG PO TABS: 5.0000 mg | ORAL_TABLET | Freq: Three times a day (TID) | ORAL | 0 refills | Status: DC | PRN

## 2016-10-21 NOTE — Progress Notes (Signed)
Pre visit review using our clinic review tool, if applicable. No additional management support is needed unless otherwise documented below in the visit note. 

## 2016-10-21 NOTE — Progress Notes (Signed)
Subjective:    Patient ID: [name] [name], female    DOB: 04-06-1961, 56 y.o.   MRN: [id number]  HPI She is here for an acute visit.   Last week she got up from her desk at work and had instant pain in her left groin.  The pain radiates down her leg to her mid upper thigh. She denies bumps or swelling.  She does not think it is a hernia.  Sitting still she has no pain.  Moving around or changing positions causes pain.    She has tried takingtyelnol or goody powder with some relief.    Medications and allergies reviewed with patient and updated if appropriate.  Patient Active Problem List   Diagnosis Date Noted  . Upper respiratory tract infection 09/23/2016  . Depression 09/03/2016  . Memory difficulties 09/03/2016  . Breast pain, right 08/24/2016  . Chronic fatigue 06/08/2016  . Cough 04/25/2016  . Palpitations 04/04/2016  . 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 Malignant neoplasm of upper-outer quadrant of right female breast ([place]) 11/03/2015  . S/P mastectomy 05/13/2015  . 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  . 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 Genetic testing 12/11/2014  . Chemotherapy induced neutropenia ([place]) 11/26/2014  . Breast cancer of upper-outer quadrant of right female breast ([place]) 10/23/2014  . Anxiety 04/29/2012  . Allergic rhinitis 11/29/2009  . 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 CONSTIPATION, CHRONIC 08/17/2008  . Dyslipidemia 04/24/2008  . METABOLIC SYNDROME X [id number]  . GERD 01/07/2008  . OBESITY, TRUNCAL 09/20/2007  . Essential hypertension 09/20/2007  . Multiple sclerosis ([place]) 09/18/2007    Current Outpatient Prescriptions on File Prior to Visit  Medication Sig Dispense Refill  . acetaminophen (TYLENOL) 500 MG tablet Take 1,000 mg by mouth 2 (two) times daily as needed (pain). Reported on 09/23/2015    . ALPRAZolam (XANAX) 0.5 MG tablet Take 1 tablet (0.5 mg total) by mouth 2  (two) times daily as needed for anxiety or sleep. 90 tablet 0  . azithromycin (ZITHROMAX) 250 MG tablet Take two tabs the first day and then one tab daily for four days 6 tablet 0  . b complex vitamins tablet Take 1 tablet by mouth daily.    [name] COPAXONE 40 MG/ML SOSY INJECT ONE SYRINGE (40 MG) SUBCUTANEOUSLY THREE TIMES PER WEEK AT LEAST 48 HOURS APART. ALLOW SYRINGE TO WARM TO ROOM TEMPERATURE FOR 20 MIN 12 Syringe 0  . ferrous gluconate (FERGON) 324 MG tablet Take 1 tablet (324 mg total) by mouth 2 (two) times daily with a meal. 180 tablet 3  . fluticasone (FLONASE) 50 MCG/ACT nasal spray USE 2 SPRAYS IN EACH       NOSTRIL DAILY 16 g 6  . gabapentin (NEURONTIN) 100 MG capsule 200 mg in morning and afternoon, 300 mg at night 630 capsule 1  . hydrochlorothiazide (HYDRODIURIL) 25 MG tablet TAKE 1 TABLET DAILY 90 tablet 2  . HYDROcodone-homatropine (HYCODAN) 5-1.5 MG/5ML syrup Take 5 mLs by mouth every 8 (eight) hours as needed for cough. 120 mL 0  . loratadine-pseudoephedrine (CLARITIN-D 12 HOUR) 5-120 MG tablet Take 1 tablet by mouth daily as needed (seasonal allergies). 30 tablet 3  . losartan (COZAAR) 50 MG tablet Take 1 tablet (50 mg total) by mouth 2 (two) times daily. 180 tablet 1  . metoprolol (LOPRESSOR) 50 MG tablet Take 0.5 tablets (25 mg total) by mouth 2 (two) times daily. 180 tablet 3  . pantoprazole (PROTONIX) 40 MG tablet Take  1 tablet (40 mg total) by mouth daily. 90 tablet 1  . potassium chloride (KLOR-CON SPRINKLE) 10 MEQ CR capsule TAKE 2 CAPSULES (20MEQ     TOTAL) TWICE DAILY 180 capsule 1  . [DISCONTINUED] mometasone (NASONEX) 50 MCG/ACT nasal spray Place 2 sprays into the nose daily. 17 g 2   No current facility-administered medications on file prior to visit.     Past Medical History:  Diagnosis Date  . Allergic rhinitis due to other allergen   . Allergy   . Angioedema    ACEI  . Anxiety   . Breast cancer of upper-outer quadrant of right female breast ([place]) 10/23/2014    s/p r mastectomy, neoadj chemo completed 04/21/15; neg genetic testing  . Colon polyps 05/2014   adenomatous, colo q 5y  . CONSTIPATION, CHRONIC   . GERD   . Hot flashes   . HYPERLIPIDEMIA   . HYPERTENSION   . Migraine headache   . Multiple sclerosis ([place])   . OBESITY, TRUNCAL     Past Surgical History:  Procedure Laterality Date  . COLONOSCOPY    . PORT-A-CATH REMOVAL Left 05/13/2015   Procedure: REMOVAL PORT-A-CATH;  Surgeon: [name], MD;  Location: [place];  Service: General;  Laterality: Left;  . PORTACATH PLACEMENT Left 11/06/2014   Procedure: INSERTION PORT-A-CATH;  Surgeon: [name], MD;  Location: [place];  Service: General;  Laterality: Left;  . SIMPLE MASTECTOMY WITH AXILLARY SENTINEL NODE BIOPSY Right 05/13/2015   Procedure: RIGHT TOTAL  MASTECTOMY WITH RIGHT  AXILLARY SENTINEL NODE BIOPSY;  Surgeon: [name], MD;  Location: [place];  Service: General;  Laterality: Right;  . TUBAL LIGATION      Social History   Social History  . Marital status: Single    Spouse name: N/A  . Number of children: 2  . Years of education: 12   Occupational History  . [place] History Main Topics  . Smoking status: Never Smoker  . Smokeless tobacco: Never Used  . Alcohol use No  . Drug use: No  . Sexual activity: Not on file   Other Topics Concern  . 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    Patient is single and lives alone.    Patient is right handed.     Family History  Problem Relation Age of Onset  . Coronary artery disease Mother   . Heart disease Mother   . Diabetes Mother   . Hypertension Other   . Hyperlipidemia Other   . Diabetes Other   . Colon cancer Neg Hx   . Alcohol abuse Father   . COPD Maternal Grandmother     Review of Systems  Constitutional: Negative for fever.    Musculoskeletal: Positive for arthralgias and gait problem (due to acute injury).  Skin: Negative for color change and rash.  Neurological: Negative for weakness and numbness.       Objective:   Vitals:   10/21/16 0904  BP: 132/86  Pulse: 82  Resp: 16  Temp: 98.4 F (36.9 C)   Filed Weights   10/21/16 0904  Weight: 221 lb (100.2 kg)   Body mass index is 37.93 kg/m.  Wt Readings from Last 3 Encounters:  10/21/16 221 lb (100.2 kg)  09/22/16 222 lb (100.7 kg)  08/24/16 222 lb (100.7 kg)     Physical  Exam  Constitutional: She appears well-developed and well-nourished. No distress.  Abdominal: Soft. She exhibits no distension and no mass. There is no tenderness. There is no rebound and no guarding.  Musculoskeletal:  Pain with palpation left groin, increased pain with abduction of hip, slight increased pain with hip flexion.  No groin mass palpated  Skin: She is not diaphoretic.        Assessment & Plan:   See Problem List for Assessment and Plan of chronic medical problems.

## 2016-10-21 NOTE — Patient Instructions (Signed)
  Medications reviewed and updated.  Changes include starting ibuprofen and muscle relaxer as needed.   Use ice and heat.   Your prescription(s) have been submitted to your pharmacy. Please take as directed and contact our office if you believe you are having problem(s) with the medication(s).

## 2016-10-21 NOTE — Assessment & Plan Note (Addendum)
Left groin strain Hernia unlikely Start ibuprofen 800 mg TID with food - stop if she develops stomach upset Flexeril 5-10 mg three times daily as needed - she understands this will cause drowsiness  FU if no improvement

## 2016-10-25 ENCOUNTER — Encounter: Payer: Self-pay | Admitting: Internal Medicine

## 2016-10-30 ENCOUNTER — Ambulatory Visit: Payer: BC Managed Care – PPO | Admitting: Family Medicine

## 2016-10-30 ENCOUNTER — Ambulatory Visit
Admission: RE | Admit: 2016-10-30 | Discharge: 2016-10-30 | Disposition: A | Discharge status: Home / Self Care | Payer: BC Managed Care – PPO | Source: Ambulatory Visit | Attending: Oncology | Admitting: Oncology

## 2016-10-30 ENCOUNTER — Ambulatory Visit: Payer: BC Managed Care – PPO | Admitting: Internal Medicine

## 2016-10-30 DIAGNOSIS — Z1231 Encounter for screening mammogram for malignant neoplasm of breast: Secondary | ICD-10-CM

## 2016-10-30 NOTE — Progress Notes (Signed)
[name] [address], [place] [address] Phone: [phone] Subjective:    I'm seeing this patient by the request  of:  Binnie Rail, MD   CC: Left groin pain  [id number]  [name] [name] is a 56 y.o. female coming in with complaint of left groin pain. Patient states approximately 3 weeks ago patient got up from her desk and had acute pain mostly in the groin area. Significant radiation down her leg. Patient was diagnosed with a muscle strain but unfortunately continued to have increasing pain. States walking seems to make it worse but even has pain that sitting. Has been doing heat as well as some ice with no significant benefit. Patient states initially ibuprofen did help but now it does not seem to help as much. Given muscle relaxers that does help her sleep some.Patient rates the severity of pain as 9 out of 10.   past medical history significant for breast cancer.  Past Medical History:  Diagnosis Date  . Allergic rhinitis due to other allergen   . Allergy   . Angioedema    ACEI  . Anxiety   . Breast cancer of upper-outer quadrant of right female breast ([place]) 10/23/2014   s/p r mastectomy, neoadj chemo completed 04/21/15; neg genetic testing  . Colon polyps 05/2014   adenomatous, colo q 5y  . CONSTIPATION, CHRONIC   . GERD   . Hot flashes   . HYPERLIPIDEMIA   . HYPERTENSION   . Migraine headache   . Multiple sclerosis ([place])   . OBESITY, TRUNCAL    Past Surgical History:  Procedure Laterality Date  . BREAST BIOPSY Right    [place]   . COLONOSCOPY    . MASTECTOMY Right   . PORT-A-CATH REMOVAL Left 05/13/2015   Procedure: REMOVAL PORT-A-CATH;  Surgeon: [name], MD;  Location: [facility];  Service: General;  Laterality: Left;  . PORTACATH PLACEMENT Left 11/06/2014   Procedure: INSERTION PORT-A-CATH;  Surgeon: [name], MD;  Location: [place];  Service: General;  Laterality: Left;  . SIMPLE MASTECTOMY  WITH AXILLARY SENTINEL NODE BIOPSY Right 05/13/2015   Procedure: RIGHT TOTAL  MASTECTOMY WITH RIGHT  AXILLARY SENTINEL NODE BIOPSY;  Surgeon: [name], MD;  Location: [facility];  Service: General;  Laterality: Right;  . TUBAL LIGATION     Social History   Social History  . Marital status: Single    Spouse name: N/A  . Number of children: 2  . Years of education: 12   Occupational History  . [place] History Main Topics  . Smoking status: Never Smoker  . Smokeless tobacco: Never Used  . Alcohol use No  . Drug use: No  . Sexual activity: Not Asked   Other Topics Concern  . 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    Patient is single and lives alone.    Patient is right handed.    Allergies  Allergen Reactions  . Ace Inhibitors Anaphylaxis     Angioedema (tongue swelling)  . Clarithromycin Anaphylaxis    Throat swells  . Levaquin [Levofloxacin] Other (See Comments)    Tendon pain - shoulder and calf   Family History  Problem Relation Age of Onset  . Coronary artery disease Mother   . Heart disease Mother   . Diabetes Mother   . Alcohol  abuse Father   . COPD Maternal Grandmother   . Hypertension Other   . Hyperlipidemia Other   . Diabetes Other   . Colon cancer Neg Hx     Past medical history, social, surgical and family history all reviewed in electronic medical record.  No pertanent information unless stated regarding to the chief complaint.   Review of Systems:Review of systems updated and as accurate as of 10/31/16  No headache, visual changes, nausea, vomiting, diarrhea, constipation, dizziness, abdominal pain, skin rash, fevers, chills, night sweats, weight loss, swollen lymph nodes, , chest pain, shortness of breath, mood changes. Positive for muscle aches  Objective  Blood pressure 124/78, pulse 95, height 5\' 4"  (1.626 m),  weight 219 lb (99.3 kg), last menstrual period 11/01/2012, SpO2 98 %. Systems examined below as of 10/31/16   General: No apparent distress alert and oriented x3 mood and affect normal, dressed appropriately.  HEENT: Pupils equal, extraocular movements intact  Respiratory: Patient's speak in full sentences and does not appear short of breath  Cardiovascular: No lower extremity edema, non tender, no erythema  Skin: Warm dry intact with no signs of infection or rash on extremities or on axial skeleton.  Abdomen: Soft nontender  Neuro: Cranial nerves II through XII are intact, neurovascularly intact in all extremities with 2+ DTRs and 2+ pulses.  Lymph: No lymphadenopathy of posterior or anterior cervical chain or axillae bilaterally.  Gait antalgic gait noted MSK:  Non tender with full range of motion and good stability and symmetric strength and tone of shoulders, elbows, wrist,  knee and bilaterally.  Severe over pronation of the hindfoot bilaterally. Right greater than left. Patient does have tenderness over the posterior tibialis tendon. Pes planus noted Hip: Left ROM IR: 10 Deg with pain with internal range of motion., ER: 35 Deg, Flexion: 120 Deg, Extension: 100 Deg, Abduction: 45 Deg, Adduction: 25 Deg Strength IR: 5/5, ER: 5/5, Flexion: 5/5, Extension: 5/5, Abduction: 4/5, Adduction: 5/5 Pelvic alignment unremarkable to inspection and palpation. Greater trochanter without tenderness to palpation. No tenderness over piriformis and greater trochanter. Severe pain with internal rotation.. No SI joint tenderness and normal minimal SI movement. Contralateral hip is fairly unremarkable.   Procedure note [id number]; 15 minutes spent for Therapeutic exercises as stated in above notes.  This included exercises focusing on stretching, strengthening, with significant focus on eccentric aspects.  Hip strengthening exercises which included:  Pelvic tilt/bracing to help with proper recruitment of the  lower abs and pelvic floor muscles  Glute strengthening to properly contract glutes without over-engaging low back and hamstrings - prone hip extension and glute bridge exercises Proper stretching techniques to increase effectiveness for the hip flexors, groin, quads, piriformic and low back when appropriate    Proper technique shown and discussed handout in great detail with ATC.  All questions were discussed and answered.    Impression and Recommendations:     This case required medical decision making of moderate complexity.      Note: This dictation was prepared with Dragon dictation along with smaller phrase technology. Any transcriptional errors that result from this process are unintentional.

## 2016-10-31 ENCOUNTER — Ambulatory Visit (INDEPENDENT_AMBULATORY_CARE_PROVIDER_SITE_OTHER): Payer: BC Managed Care – PPO | Admitting: Family Medicine

## 2016-10-31 ENCOUNTER — Encounter: Payer: Self-pay | Admitting: Family Medicine

## 2016-10-31 ENCOUNTER — Ambulatory Visit (INDEPENDENT_AMBULATORY_CARE_PROVIDER_SITE_OTHER)
Admission: RE | Admit: 2016-10-31 | Discharge: 2016-10-31 | Disposition: A | Discharge status: Home / Self Care | Payer: BC Managed Care – PPO | Source: Ambulatory Visit | Attending: Family Medicine | Admitting: Family Medicine

## 2016-10-31 VITALS — BP 124/78 | HR 95 | Ht 64.0 in | Wt 219.0 lb

## 2016-10-31 DIAGNOSIS — M2142 Flat foot [pes planus] (acquired), left foot: Secondary | ICD-10-CM

## 2016-10-31 DIAGNOSIS — M2140 Flat foot [pes planus] (acquired), unspecified foot: Secondary | ICD-10-CM | POA: Insufficient documentation

## 2016-10-31 DIAGNOSIS — M629 Disorder of muscle, unspecified: Secondary | ICD-10-CM | POA: Diagnosis not present

## 2016-10-31 DIAGNOSIS — M25552 Pain in left hip: Secondary | ICD-10-CM

## 2016-10-31 DIAGNOSIS — M76829 Posterior tibial tendinitis, unspecified leg: Secondary | ICD-10-CM | POA: Insufficient documentation

## 2016-10-31 DIAGNOSIS — M2141 Flat foot [pes planus] (acquired), right foot: Secondary | ICD-10-CM

## 2016-10-31 DIAGNOSIS — Z0289 Encounter for other administrative examinations: Secondary | ICD-10-CM

## 2016-10-31 MED ORDER — VITAMIN D (ERGOCALCIFEROL) 1.25 MG (50000 UNIT) PO CAPS: 50000.0000 [IU] | ORAL_CAPSULE | ORAL | 0 refills | Status: DC

## 2016-10-31 MED ORDER — DICLOFENAC SODIUM 2 % TD SOLN: 2.0000 g | Freq: Two times a day (BID) | TRANSDERMAL | 3 refills | Status: DC

## 2016-10-31 NOTE — Assessment & Plan Note (Signed)
Bilateral. Discussed over-the-counter orthotics. Discussed avoiding being barefoot. Topical anti-inflammatories given. Discussed the importance of weight loss.

## 2016-10-31 NOTE — Assessment & Plan Note (Signed)
Concerned the patient may have unfortunately have some arthritic changes. Patient has x-rays pending. No significant weakness. Patient past pedicle history of breast cancer and multiple sclerosis makes possible x-rays necessary. We will see how patient does show there is no bony mass contributing. We'll also evaluate for the amount of arthritis. Patient will try conservative therapy including home exercises, work with Product/process development scientist, we discussed proper shoes, we discussed that this could be beneficial as well. Follow-up again in 4 weeks. And potentially consider injection.

## 2016-10-31 NOTE — Patient Instructions (Addendum)
Good to see you.  Xrays downstairs today  Ice 20 minutes 2 times daily. Usually after activity and before bed. pennsaid pinkie amount topically 2 times daily as needed.  Increase gabapentin to 300mg  at night Exercises 3 times a week.  Spenco orthotics "total support" online would be great  Rigid shoe are better with a lot of activity  Once weekly vitamin D for next 12 weeks and stop the daily  Then over the counter Tylenol 500mg  3 times a day  Turmeric 500mg  daily  Tart cherry extract any dose at night See me again in 4 weeks.

## 2016-11-03 ENCOUNTER — Telehealth: Payer: Self-pay | Admitting: *Deleted

## 2016-11-03 MED ORDER — METOPROLOL TARTRATE 25 MG PO TABS: 25.0000 mg | ORAL_TABLET | Freq: Two times a day (BID) | ORAL | 3 refills | Status: DC

## 2016-11-03 NOTE — Telephone Encounter (Signed)
Rec'd call pt states MD has her taking Metoprolol 25 mg twice a day, but [facility] said's 50 mg to take 1/2 twice a day. She is wanting a script sent in for the 25 mg because she can not break them in half evenly. Inform pt will update and send to [facility]...Johny Chess

## 2016-11-06 ENCOUNTER — Other Ambulatory Visit (HOSPITAL_BASED_OUTPATIENT_CLINIC_OR_DEPARTMENT_OTHER): Payer: BC Managed Care – PPO

## 2016-11-06 ENCOUNTER — Telehealth: Payer: Self-pay | Admitting: *Deleted

## 2016-11-06 DIAGNOSIS — C50411 Malignant neoplasm of upper-outer quadrant of right female breast: Secondary | ICD-10-CM

## 2016-11-06 LAB — COMPREHENSIVE METABOLIC PANEL
ALBUMIN: 4 g/dL (ref 3.5–5.0)
ALK PHOS: 115 U/L (ref 40–150)
ALT: 21 U/L (ref 0–55)
AST: 18 U/L (ref 5–34)
Anion Gap: 11 mEq/L (ref 3–11)
BILIRUBIN TOTAL: 0.28 mg/dL (ref 0.20–1.20)
BUN: 14 mg/dL (ref 7.0–26.0)
CO2: 25 meq/L (ref 22–29)
CREATININE: 0.9 mg/dL (ref 0.6–1.1)
Calcium: 9.7 mg/dL (ref 8.4–10.4)
Chloride: 104 mEq/L (ref 98–109)
EGFR: 85 mL/min/{1.73_m2} — ABNORMAL LOW (ref >90)
GLUCOSE: 98 mg/dL (ref 70–140)
Potassium: 3.6 mEq/L (ref 3.5–5.1)
SODIUM: 140 meq/L (ref 136–145)
TOTAL PROTEIN: 7.5 g/dL (ref 6.4–8.3)

## 2016-11-06 NOTE — Telephone Encounter (Signed)
Rec'd call from pt stating the procedure she had w/Dr. Tamala Julian is working a tad bit better, but she is still having pain in that one area. She is able to lift that foot, but having pain in her (R) knee now. She is requesting recommendation on what else she can do to ease the pain. Does not want anymore pain meds...[name] [name]

## 2016-11-06 NOTE — Telephone Encounter (Signed)
I am hoping with time it will get better.  Otherwise have her move her appointment up and we can consider an injection.

## 2016-11-06 NOTE — Telephone Encounter (Signed)
Notified pt w/MD response.../lmb 

## 2016-11-07 ENCOUNTER — Ambulatory Visit: Payer: BC Managed Care – PPO | Admitting: Family Medicine

## 2016-11-08 ENCOUNTER — Encounter: Payer: Self-pay | Admitting: Nurse Practitioner

## 2016-11-08 ENCOUNTER — Encounter: Payer: Self-pay | Admitting: Internal Medicine

## 2016-11-08 NOTE — Telephone Encounter (Signed)
Please advise, since Dr Quay Burow is out of the office.

## 2016-11-09 ENCOUNTER — Other Ambulatory Visit: Payer: Self-pay | Admitting: Internal Medicine

## 2016-11-09 NOTE — Telephone Encounter (Signed)
I spoke to pt and reiterated what she was told already by angie in billing.  That she would need to pay no show fee $50 first, then an appt could be made.  From there could then get her medication up to appt.  Pt verbalized understanding.  She is on pt assistance for copaxone (shared solutions).

## 2016-11-09 NOTE — Telephone Encounter (Signed)
I spoke to pt and will call her back after speaking with angie in billing.  Pt needs to pay $50 no show fee prior to appt being made. .  Pt was going back to work.   I will call her at her work #.

## 2016-11-13 ENCOUNTER — Ambulatory Visit (HOSPITAL_BASED_OUTPATIENT_CLINIC_OR_DEPARTMENT_OTHER): Payer: BC Managed Care – PPO | Admitting: Oncology

## 2016-11-13 VITALS — BP 147/77 | HR 98 | Temp 98.1°F | Resp 20 | Ht 64.0 in | Wt 220.4 lb

## 2016-11-13 DIAGNOSIS — Z9221 Personal history of antineoplastic chemotherapy: Secondary | ICD-10-CM | POA: Diagnosis not present

## 2016-11-13 DIAGNOSIS — Z9011 Acquired absence of right breast and nipple: Secondary | ICD-10-CM | POA: Diagnosis not present

## 2016-11-13 DIAGNOSIS — R0789 Other chest pain: Secondary | ICD-10-CM

## 2016-11-13 DIAGNOSIS — Z170 Estrogen receptor positive status [ER+]: Principal | ICD-10-CM

## 2016-11-13 DIAGNOSIS — Z171 Estrogen receptor negative status [ER-]: Secondary | ICD-10-CM | POA: Diagnosis not present

## 2016-11-13 DIAGNOSIS — C50411 Malignant neoplasm of upper-outer quadrant of right female breast: Secondary | ICD-10-CM

## 2016-11-13 DIAGNOSIS — M1990 Unspecified osteoarthritis, unspecified site: Secondary | ICD-10-CM | POA: Diagnosis not present

## 2016-11-13 NOTE — Progress Notes (Signed)
[place]  Telephone:(336) [phone] Fax:(336) [phone]     ID: [name] [name] DOB: 02/05/1961  MR#: [id number]  FSF#:[id number]  Patient Care Team: Binnie Rail, MD as PCP - General (Internal Medicine) [name], MD as Consulting Physician (Obstetrics and Gynecology) [name], MD (Gastroenterology) [name]. [name], MD (Neurology) [name], MD as Consulting Physician (General Surgery) [name], MD as Consulting Physician (Oncology) [name], MD as Attending Physician (Radiation Oncology) [name], RN as Registered Nurse [name], RN as Registered Nurse Sylvan Cheese, NP as Nurse Practitioner (Nurse Practitioner) Lyndal Pulley, DO as Attending Physician (Family Medicine) OTHER MD:  CHIEF COMPLAINT: Triple negative breast cancer  CURRENT TREATMENT: Observation  BREAST CANCER HISTORY: From the original intake note:  [name] [name] had bilateral screening mammography at the [facility] 11/21/2013. Breast density was category C. Exam was unremarkable. In January 2016 however the patient palpated a change in her right breast laterally. She contacted her gynecologist, Dr. [name], and he set her up for a unilateral right diagnostic mammography with right breast ultrasonography at [facility] 10/15/2014. There was a focal irregular opacity in the lateral right breast associated with pleomorphic calcifications. This was palpable on exam as a firm non-mobile mass. Ultrasound confirmed a hypoechoic mass in the right breast at the 9:00 position 5 cm from the nipple, measuring 2.9 cm. Next to this mass was a normal-sized intramammary lymph node measuring 4 mm. In the right axilla there were 3 contiguous level I lymph nodes the largest measuring 11 mm.  On 10/20/2014 the patient underwent right core needle biopsy of the 9:00 breast mass, showing ([id number]) and invasive ductal carcinoma, grade 3, triple negative, with the  HER-2/neu signals ratio of 1.26 and number per cell 2.15. The MIB-1 was 71%. Biopsy of the largest right axillary lymph node was negative. Dr. Owens Shark the mammographer who performed a biopsy describes this is concordant.  Left breast mammography 10/26/2014 was negative, and bilateral breast MRI the same day confirmed, in the right breast, and upper outer quadrant mass measuring 2.5 cm, with a second mass measuring 1.4 cm slightly anterior and superior to it. In addition there was a total area of approximately 6.5 cm of non-masslike enhancement extending throughout the upper outer quadrant of the right breast. There was a cortically thickened right axillary lymph node which had been biopsied. There was also a mildly cortically thickened right subpectoral lymph node. There were no internal mammary or left axillary lymph nodes in the left breast was unremarkable.  The patient's subsequent history is as detailed below.  INTERVAL HISTORY: [name] [name] returns today for follow-up of her triple negative breast cancer. The interval history is not suggestive of disease recurrence. She continues to work full time. She exercises chiefly by walking and most days she gets anywhere from 4-10,000 steps in  REVIEW OF SYSTEMS: [name] [name] of tells me one day she stood up from a sitting position and developed significant pain in her left hip. She brought it to medical attention and was initially told she might have pulled a groin muscle but later was evaluated by [facility] base with some films which shows significant arthritis. He started her on vitamin D, turmeric, and tart sherry extract and she says things are better although the problem has not completely gone away. She also has some tenderness or sensitivity in the medial upper aspect of her right mastectomy scar aside from these issues a detailed review of systems today was  noncontributory  PAST MEDICAL HISTORY: Past Medical History:  Diagnosis Date  . Allergic rhinitis due to  other allergen   . Allergy   . Angioedema    ACEI  . Anxiety   . Breast cancer of upper-outer quadrant of right female breast ([place]) 10/23/2014   s/p r mastectomy, neoadj chemo completed 04/21/15; neg genetic testing  . Colon polyps 05/2014   adenomatous, colo q 5y  . CONSTIPATION, CHRONIC   . GERD   . Hot flashes   . HYPERLIPIDEMIA   . HYPERTENSION   . Migraine headache   . Multiple sclerosis ([place])   . OBESITY, TRUNCAL     PAST SURGICAL HISTORY: Past Surgical History:  Procedure Laterality Date  . BREAST BIOPSY Right    [place]   . COLONOSCOPY    . MASTECTOMY Right   . PORT-A-CATH REMOVAL Left 05/13/2015   Procedure: REMOVAL PORT-A-CATH;  Surgeon: [name], MD;  Location: [facility];  Service: General;  Laterality: Left;  . PORTACATH PLACEMENT Left 11/06/2014   Procedure: INSERTION PORT-A-CATH;  Surgeon: [name], MD;  Location: [facility];  Service: General;  Laterality: Left;  . SIMPLE MASTECTOMY WITH AXILLARY SENTINEL NODE BIOPSY Right 05/13/2015   Procedure: RIGHT TOTAL  MASTECTOMY WITH RIGHT  AXILLARY SENTINEL NODE BIOPSY;  Surgeon: [name], MD;  Location: [facility];  Service: General;  Laterality: Right;  . TUBAL LIGATION      FAMILY HISTORY Family History  Problem Relation Age of Onset  . Coronary artery disease Mother   . Heart disease Mother   . Diabetes Mother   . Alcohol abuse Father   . COPD Maternal Grandmother   . Hypertension Other   . Hyperlipidemia Other   . Diabetes Other   . Colon cancer Neg Hx    the patient's father died at the age of 80 following his seizure. The patient's mother died from congestive 66 failure at the age of 59. The patient had 5 brothers and 2 sisters. One brother died from Escherichia coli infection, the other one had a history of seizure disorder. One sister died from causes unknown to the patient. There is no history of breast or ovarian cancer in the family.  GYNECOLOGIC HISTORY:  Patient's last  menstrual period was 11/01/2012. Menarche age 48, first live birth age 27. The patient is GX P2. She stopped having periods in May 2014. She did not take hormone replacement. She did take her control pills remotely for approximately 4 years with no complications.  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 Her daughter [name] [name] lives in [place] and works in Ambulance person for [employer]. Son [name] [name] is an Engineer, building services for [employer]. The patient has 1 grand son who graduated from high school June 2017    ADVANCED DIRECTIVES: Not in place   HEALTH MAINTENANCE: Social History  Substance Use Topics  . Smoking status: Never Smoker  . Smokeless tobacco: Never Used  . Alcohol use No     Colonoscopy: September 2015/[name]  PAP: February 2015  Bone density:  Lipid panel:  Allergies  Allergen Reactions  . Ace Inhibitors Anaphylaxis     Angioedema (tongue swelling)  . Clarithromycin Anaphylaxis    Throat swells  . Levaquin [Levofloxacin] Other (See Comments)    Tendon pain - shoulder and calf    Current Outpatient Prescriptions  Medication Sig Dispense  Refill  . acetaminophen (TYLENOL) 500 MG tablet Take 1,000 mg by mouth 2 (two) times daily as needed (pain). Reported on 09/23/2015    . ALPRAZolam (XANAX) 0.5 MG tablet Take 1 tablet (0.5 mg total) by mouth 2 (two) times daily as needed for anxiety or sleep. 90 tablet 0  . b complex vitamins tablet Take 1 tablet by mouth daily.    [name] COPAXONE 40 MG/ML SOSY INJECT ONE SYRINGE (40 MG) SUBCUTANEOUSLY THREE TIMES PER WEEK AT LEAST 48 HOURS APART. ALLOW SYRINGE TO WARM TO ROOM TEMPERATURE FOR 20 MIN 12 Syringe 0  . cyclobenzaprine (FLEXERIL) 5 MG tablet Take 1-2 tablets (5-10 mg total) by mouth 3 (three) times daily as needed for muscle spasms. 30 tablet 0  . Diclofenac Sodium 2 % SOLN Place 2 g onto  the skin 2 (two) times daily. 112 g 3  . ferrous gluconate (FERGON) 324 MG tablet Take 1 tablet (324 mg total) by mouth 2 (two) times daily with a meal. 180 tablet 3  . fluticasone (FLONASE) 50 MCG/ACT nasal spray USE 2 SPRAYS IN EACH       NOSTRIL DAILY 16 g 6  . gabapentin (NEURONTIN) 100 MG capsule 200 mg in morning and afternoon, 300 mg at night 630 capsule 1  . hydrochlorothiazide (HYDRODIURIL) 25 MG tablet TAKE 1 TABLET DAILY 90 tablet 2  . HYDROcodone-homatropine (HYCODAN) 5-1.5 MG/5ML syrup Take 5 mLs by mouth every 8 (eight) hours as needed for cough. 120 mL 0  . ibuprofen (ADVIL,MOTRIN) 800 MG tablet Take 1 tablet (800 mg total) by mouth every 8 (eight) hours as needed. 30 tablet 0  . KLOR-CON SPRINKLE 10 MEQ CR capsule TAKE 2 CAPSULES (20MEQ     TOTAL) TWICE DAILY 180 capsule 1  . loratadine-pseudoephedrine (CLARITIN-D 12 HOUR) 5-120 MG tablet Take 1 tablet by mouth daily as needed (seasonal allergies). 30 tablet 3  . losartan (COZAAR) 50 MG tablet Take 1 tablet (50 mg total) by mouth 2 (two) times daily. 180 tablet 1  . metoprolol tartrate (LOPRESSOR) 25 MG tablet Take 1 tablet (25 mg total) by mouth 2 (two) times daily. 180 tablet 3  . pantoprazole (PROTONIX) 40 MG tablet Take 1 tablet (40 mg total) by mouth daily. 90 tablet 1  . Vitamin D, Ergocalciferol, (DRISDOL) 50000 units CAPS capsule Take 1 capsule (50,000 Units total) by mouth every 7 (seven) days. 12 capsule 0   No current facility-administered medications for this visit.     OBJECTIVE: Middle-aged African-American woman In no acute distress  Vitals:   11/13/16 1518  BP: (!) 147/77  [name]: 98  Resp: 20  Temp: 98.1 F (36.7 C)     Body mass index is 37.83 kg/m.    ECOG FS:1 - Symptomatic but completely ambulatory   Sclerae unicteric, EOMs intact Oropharynx clear and moist No cervical or supraclavicular adenopathy Lungs no rales or rhonchi Heart regular rate and rhythm Abd soft, nontender, positive bowel  sounds MSK no focal spinal tenderness, no upper extremity lymphedema Neuro: nonfocal, well oriented, appropriate affect Breasts: The right breast is status post mastectomy. I do not palpate any significant abnormality in the right chest wall including the area where she is more sensitive. There is no evidence of local recurrence. The right axilla is benign. The left breast is unremarkable   LAB RESULTS:  CMP     Component Value Date/Time   NA 140 11/06/2016 1503   K 3.6 11/06/2016 1503   CL 103 05/30/2016 1619  CO2 25 11/06/2016 1503   GLUCOSE 98 11/06/2016 1503   BUN 14.0 11/06/2016 1503   CREATININE 0.9 11/06/2016 1503   CALCIUM 9.7 11/06/2016 1503   PROT 7.5 11/06/2016 1503   ALBUMIN 4.0 11/06/2016 1503   AST 18 11/06/2016 1503   ALT 21 11/06/2016 1503   ALKPHOS 115 11/06/2016 1503   BILITOT 0.28 11/06/2016 1503   GFRNONAA >60 05/14/2015 0327   GFRAA >60 05/14/2015 0327    INo results found for: SPEP, UPEP  Lab Results  Component Value Date   WBC 6.1 05/30/2016   NEUTROABS 3.5 05/30/2016   HGB 12.6 05/30/2016   HCT 36.7 05/30/2016   MCV 87.9 05/30/2016   PLT 238.0 05/30/2016      Chemistry      Component Value Date/Time   NA 140 11/06/2016 1503   K 3.6 11/06/2016 1503   CL 103 05/30/2016 1619   CO2 25 11/06/2016 1503   BUN 14.0 11/06/2016 1503   CREATININE 0.9 11/06/2016 1503      Component Value Date/Time   CALCIUM 9.7 11/06/2016 1503   ALKPHOS 115 11/06/2016 1503   AST 18 11/06/2016 1503   ALT 21 11/06/2016 1503   BILITOT 0.28 11/06/2016 1503       No results found for: LABCA2  No components found for: LABCA125  No results for input(s): INR in the last 168 hours.  Urinalysis    Component Value Date/Time   COLORURINE YELLOW 05/30/2016 1619   APPEARANCEUR CLEAR 05/30/2016 1619   LABSPEC 1.010 05/30/2016 1619   PHURINE 6.0 05/30/2016 1619   GLUCOSEU NEGATIVE 05/30/2016 1619   HGBUR NEGATIVE 05/30/2016 1619   HGBUR negative 08/19/2010 [place] 05/30/2016 1619   KETONESUR NEGATIVE 05/30/2016 1619   UROBILINOGEN 0.2 05/30/2016 1619   NITRITE NEGATIVE 05/30/2016 1619   LEUKOCYTESUR NEGATIVE 05/30/2016 1619    STUDIES: Dg Lumbar Spine Complete  Result Date: 10/31/2016 CLINICAL DATA:  Two weeks of left hip pain with no known injury EXAM: LUMBAR SPINE - COMPLETE 4+ VIEW COMPARISON:  None in PACs FINDINGS: The lumbar vertebral bodies are preserved in height. There is no lytic or blastic lesion. There is disc space narrowing at L4-5. There is no spondylolisthesis. There small anterior endplate osteophytes at multiple levels. The pedicles and transverse processes are intact. There is mild facet joint hypertrophy at L5-S1. The observed portions of the sacrum are normal. IMPRESSION: There is degenerative disc space narrowing at L4-5 with facet joint hypertrophy at L5-S1 consistent with osteoarthritis. No compression fracture nor other acute bony abnormality. Electronically Signed   By: David  Martinique M.D.   On: 10/31/2016 10:10   Mm Digital Screening Unilat L  Result Date: 10/30/2016 CLINICAL DATA:  Screening. History of right breast cancer, status post mastectomy in August 2016. EXAM: DIGITAL SCREENING UNILATERAL LEFT MAMMOGRAM WITH CAD COMPARISON:  Previous exam(s). ACR Breast Density Category c: The breast tissue is heterogeneously dense, which may obscure small masses. FINDINGS: There are no findings suspicious for malignancy. Images were processed with CAD. IMPRESSION: No mammographic evidence of malignancy. A result letter of this screening mammogram will be mailed directly to the patient. RECOMMENDATION: Screening mammogram in one year. (Code:[id number]-B-01Y) BI-RADS CATEGORY  1: Negative. Electronically Signed   By: Curlene Dolphin M.D.   On: 10/30/2016 14:17   Dg Hip Unilat With Pelvis 2-3 Views Left  Result Date: 10/31/2016 CLINICAL DATA:  Low back pain and left hip pain for the past 2 weeks without known injury EXAM:  DG HIP  (WITH OR WITHOUT PELVIS) 2-3V LEFT COMPARISON:  None in PACs FINDINGS: AP and lateral views of the left hip reveal preservation of the joint space. The articular surfaces of the femoral head and acetabulum remains smoothly rounded. The femoral neck, intertrochanteric, and subtrochanteric regions are normal. No lytic or blastic bony lesion is observed. The observed portions of the left hemipelvis are unremarkable. IMPRESSION: There is no acute or significant chronic bony abnormality of the left hip. Given the patient's history of breast malignancy, if the patient's symptoms persist, whole-body nuclear bone scanning would be a useful next imaging step. Electronically Signed   By: David  Martinique M.D.   On: 10/31/2016 10:11    ASSESSMENT: 56 y.o. 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   2) neoadjuvant chemotherapy consisting of cyclophosphamide and doxorubicin in dose dense fashion 4 given with Neulasta support, completed 01/06/2015, followed by weekly carboplatin and paclitaxel 12 completed 04/21/2015  (3) status post right mastectomy and sentinel lymph node sampling 05/13/2015 for a ypT1c ypN0 invasive ductal carcinoma, high-grade, with ample margins.  (4) adjuvant radiation not necessary  PLAN: Audry Pili now a year and a half out from definitive surgery for her breast cancer with no evidence of disease recurrence. This is favorable.  The discomfort she has in her right chest wall area disappears  when she takes off her prosthesis and bra at home and does not occur in weekends when she does not wear her prosthesis and bra. I think she should consider a different per cc, perhaps one that is less heavily, or different type of bra and I have gone ahead and given her a prescription for this. I don't think this represents breast cancer recurrence.  She did improve with Dr. Thompson Caul treatments but she still has some issues with the lower back and left hip and I think she might benefit from rehabilitation. I have gone ahead and placed that referral regarding.  She is going to see her surgeon late April. She will return to see me in July. She knows to call for any problems that may develop before that visit.   [name], MD   11/13/2016 3:50 PM

## 2016-11-14 ENCOUNTER — Ambulatory Visit: Payer: BC Managed Care – PPO | Attending: Oncology | Admitting: Physical Therapy

## 2016-11-14 DIAGNOSIS — R293 Abnormal posture: Secondary | ICD-10-CM | POA: Insufficient documentation

## 2016-11-14 DIAGNOSIS — M6281 Muscle weakness (generalized): Secondary | ICD-10-CM | POA: Insufficient documentation

## 2016-11-14 DIAGNOSIS — M25552 Pain in left hip: Secondary | ICD-10-CM | POA: Insufficient documentation

## 2016-11-14 NOTE — Patient Instructions (Signed)

## 2016-11-14 NOTE — Therapy (Addendum)
[place], [place], [address] Phone: [phone]   Fax:  [phone]  Physical Therapy Evaluation  Patient Details  Name: [name] [name] MRN: [id number] Date of Birth: March 23, 1961 Referring Provider: Dr. Jana Hakim   Encounter Date: 11/14/2016      PT End of Session - 11/14/16 1140    Visit Number 1   Number of Visits 4   Date for PT Re-Evaluation 01/12/17   PT Start Time 1017   PT Stop Time 1100   PT Time Calculation (min) 43 min   Activity Tolerance Patient tolerated treatment well   Behavior During Therapy [facility] for tasks assessed/performed      Past Medical History:  Diagnosis Date  . Allergic rhinitis due to other allergen   . Allergy   . Angioedema    ACEI  . Anxiety   . Breast cancer of upper-outer quadrant of right female breast ([place]) 10/23/2014   s/p r mastectomy, neoadj chemo completed 04/21/15; neg genetic testing  . Colon polyps 05/2014   adenomatous, colo q 5y  . CONSTIPATION, CHRONIC   . GERD   . Hot flashes   . HYPERLIPIDEMIA   . HYPERTENSION   . Migraine headache   . Multiple sclerosis ([place])   . OBESITY, TRUNCAL     Past Surgical History:  Procedure Laterality Date  . BREAST BIOPSY Right    [place]   . COLONOSCOPY    . MASTECTOMY Right   . PORT-A-CATH REMOVAL Left 05/13/2015   Procedure: REMOVAL PORT-A-CATH;  Surgeon: [name], MD;  Location: [place];  Service: General;  Laterality: Left;  . PORTACATH PLACEMENT Left 11/06/2014   Procedure: INSERTION PORT-A-CATH;  Surgeon: [name], MD;  Location: [place];  Service: General;  Laterality: Left;  . SIMPLE MASTECTOMY WITH AXILLARY SENTINEL NODE BIOPSY Right 05/13/2015   Procedure: RIGHT TOTAL  MASTECTOMY WITH RIGHT  AXILLARY SENTINEL NODE BIOPSY;  Surgeon: [name], MD;  Location: [place];  Service: General;  Laterality: Right;  . TUBAL LIGATION      There were no vitals filed for this  visit.       Subjective Assessment - 11/14/16 1022    Subjective "I'm having hip pain"  getting up out of my chair at work one day and it just caught me.  Around 4th or 5th of Feb.    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 Also has diagnosis of MS but has not had any problems with that .    Patient Stated Goals to get rid of hip pain     Currently in Pain? Yes   Pain Score 3    Pain Orientation Left   Pain Descriptors / Indicators Stabbing   Pain Type Acute pain   Pain Radiating Towards no    Pain Onset 1 to 4 weeks ago   Pain Frequency Constant   Aggravating Factors  after sitting    Pain Relieving Factors stand ups and takes a minute does not get any worse or any better as she walks     Effect of Pain on Daily Activities doesn't really affect             [facility] PT Assessment - 11/14/16 0001      Assessment   Medical Diagnosis lumbar arthritis   Referring Provider Dr. Jana Hakim    Onset Date/Surgical Date 10/22/16   Prior Therapy none for this problem. PT  in 2016 for post mastectomy rehab     Precautions   Precautions Other (comment)   Precaution Comments previous history of chemo and MS      Restrictions   Weight Bearing Restrictions No     Balance Screen   Has the patient fallen in the past 6 months No   Has the patient had a decrease in activity level because of a fear of falling?  No   Is the patient reluctant to leave their home because of a fear of falling?  No     Home Social worker Private residence   Living Arrangements Alone   Available Help at Discharge Available PRN/intermittently     Prior Function   Level of Independence Independent   Vocation Full time employment   Vocation Requirements sits most of time, but gets up and walks around as needed    Leisure does not regularly exercise  She wants to start back walking and has been involved in the  [employer] program.      Cognition   Overall Cognitive Status Within Functional Limits for tasks assessed     Observation/Other Assessments   Observations Pt required extra time when getting from sit to stand due to pain  She has truncal obesity    Skin Integrity intact.  Well healed incision in left chest and axilla      Sensation   Light Touch Appears Intact   Additional Comments pt denies numbness and tingling in legs/reports that the gabapentin helps with the neuropahy pain she had      Coordination   Gross Motor Movements are Fluid and Coordinated No   Coordination and Movement Description diffculty with transitional movments due to hip pain      Posture/Postural Control   Posture/Postural Control Postural limitations   Postural Limitations Anterior pelvic tilt   Posture Comments large anterior abdomen      ROM / Strength   AROM / PROM / Strength AROM;Strength     AROM   Overall AROM  Within functional limits for tasks performed   Overall AROM Comments only painful in left hip external rotation , otherwise within functional limits    Left Hip External Rotation  --  limited by pain in with "figure 4" position      Strength   Overall Strength Deficits   Overall Strength Comments pt appears to have generalized deconditioning.  She knows she needs to walk and do exercise, but doesn't do it.    Right Hip Flexion 4+/5   Right Hip ABduction 4/5   Left Hip Flexion 4/5  pain " a little bit"    Left Hip External Rotation 4/5  pain "a little bit"   Left Hip ABduction 4/5     Palpation   Palpation comment pt reports tenderness to deep palpation in left anterior hip.  she felt some relief to deep palpation to posterior hip and gluteal area      Bed Mobility   Bed Mobility Rolling Right;Rolling Left;Supine to Sit   Rolling Right 7: Independent   Rolling Left 7: Independent   Supine to Sit 7: Independent     Transfers   Transfers Sit to Stand   Comments extra time needed  due to pain in left hip      Ambulation/Gait   Ambulation/Gait Yes   Ambulation/Gait Assistance 7: [place] Adult  PT Treatment/Exercise - 11/14/16 0001      Self-Care   Self-Care Other Self-Care Comments   Other Self-Care Comments  talked with patient about using piillow between her knees to support her leg as she is a side sleeper , encouraged pt to start a walking program  She states she has been having some sensitivity at right mastectomy site skin.  suggested she use different textures on skin for densitization. Also issued information about knitted knockers for lighter weight prosthesis and she plans to go to [employer] to look for other options also      Lumbar Exercises: Supine   Ab Set 5 reps   Clam 5 reps;2 seconds   Clam Limitations cues to keep abdominal stabalization    Heel Slides 5 reps;Limitations   Heel Slides Limitations cues for abdominal stabalization    Bent Knee Raise 5 reps;2 seconds   Bent Knee Raise Limitations pt with cues to stay slow and controlled    Other Supine Lumbar Exercises pelvic tilt                 PT Education - 11/14/16 1108    Education provided Yes   Education Details transverse abdominal exercise   Person(s) Educated Patient   Methods Explanation;Demonstration;Handout   Comprehension Verbalized understanding                Long Term Clinic Goals - 11/14/16 1149      CC Long Term Goal  #1   Title Pt will report a decrease in pain in left hip by 75%    Time 8   Period Weeks   Status New     CC Long Term Goal  #2   Title Patient will be independent in a home exercise program   Time 8   Period Weeks   Status New            Plan - 11/14/16 1141    Clinical Impression Statement Ms. Weltman comes in for treatment of her hip pain which she says has been gradually getting better.  Xray identified arthritis in her lumbar spine, but no arthritis in her hip.  Objective  exam showed painful hip external rotation in figure 4 position and some slighly decreased strength in left hip compared to right.  She also has obesity with generalized decreased strength along with history of MS and CIPN .  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 She was instructed in beginning exercise for lumbar mobility and lower abdominal strenthening.  She will return in about 2 weeks for reassessment after doing these exercises at home and progression of home program.    Rehab Potential Excellent   Clinical Impairments Affecting Rehab Potential previous CIPN, history of MS.    PT Frequency Biweekly   PT Duration 8 weeks   PT Treatment/Interventions ADLs/Self Care Home Management;Patient/family education   PT Next Visit Plan progress HEP to supine straight leg raises and standing hip exercises.encourage walking program or return to [place].   Consulted and Agree with Plan of Care Patient      Patient will benefit from skilled therapeutic intervention in order to improve the following deficits and impairments:  Pain, Obesity, Decreased range of motion, Decreased strength  Visit Diagnosis: Abnormal posture - Plan: PT plan of care cert/re-cert  Pain in left hip - Plan: PT plan of care cert/re-cert  Muscle weakness (generalized) - Plan: PT plan of care cert/re-cert     Problem  List Patient Active Problem List   Diagnosis Date Noted  . Left hip pain 10/31/2016  . Pes planus 10/31/2016  . Posterior tibialis muscle dysfunction 10/31/2016  . Muscle strain 10/21/2016  . Upper respiratory tract infection 09/23/2016  . Depression 09/03/2016  . Memory difficulties 09/03/2016  . Breast pain, right 08/24/2016  . Chronic fatigue 06/08/2016  . Cough 04/25/2016  . Palpitations 04/04/2016  . 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 Malignant neoplasm of upper-outer quadrant of right breast in female, 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[place]) 11/03/2015  . S/P mastectomy 05/13/2015  .  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  . 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 Genetic testing 12/11/2014  . Chemotherapy induced neutropenia ([place]) 11/26/2014  . Breast cancer of upper-outer quadrant of right female breast ([place]) 10/23/2014  . Anxiety 04/29/2012  . Allergic rhinitis 11/29/2009  . 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 CONSTIPATION, CHRONIC 08/17/2008  . Dyslipidemia 04/24/2008  . METABOLIC SYNDROME X 39/58/4417  . GERD 01/07/2008  . OBESITY, TRUNCAL 09/20/2007  . Essential hypertension 09/20/2007  . Multiple sclerosis ([place]) 09/18/2007   Donato Heinz. Owens Shark PT  Norwood Levo 11/14/2016, 12:45 PM  [place], [place], [address] Phone: [phone]   Fax:  [phone]  Name: Carlisia Geno MRN: [id number] Date of Birth: 1961-01-09 PHYSICAL THERAPY DISCHARGE SUMMARY  Visits from Start of Care: 1 Current functional level related to goals / functional outcomes: unknown   Remaining deficits: Unknown    Education / Equipment: Home exercise  Plan: Patient agrees to discharge.  Patient goals were not met. Patient is being discharged due to not returning since the last visit.  ?????    Donato Heinz. Owens Shark, PT

## 2016-11-28 ENCOUNTER — Ambulatory Visit: Payer: BC Managed Care – PPO | Admitting: Physical Therapy

## 2016-11-28 NOTE — Progress Notes (Deleted)
[name] [address] [place], [place] [address] Phone: [phone] Subjective:    I'm seeing this patient by the request  of:  Binnie Rail, MD   CC: Left groin pain f/u  CNO:[id number]  [name] [name] is a 56 y.o. female coming in with complaint of left groin pain. Hip xray normal. Patient to try conservative therapy and has gone to formal physical therapy 1 time. Patient states   past medical history significant for breast cancer.  Past Medical History:  Diagnosis Date  . Allergic rhinitis due to other allergen   . Allergy   . Angioedema    ACEI  . Anxiety   . Breast cancer of upper-outer quadrant of right female breast ([place]) 10/23/2014   s/p r mastectomy, neoadj chemo completed 04/21/15; neg genetic testing  . Colon polyps 05/2014   adenomatous, colo q 5y  . CONSTIPATION, CHRONIC   . GERD   . Hot flashes   . HYPERLIPIDEMIA   . HYPERTENSION   . Migraine headache   . Multiple sclerosis ([place])   . OBESITY, TRUNCAL    Past Surgical History:  Procedure Laterality Date  . BREAST BIOPSY Right    [place]   . COLONOSCOPY    . MASTECTOMY Right   . PORT-A-CATH REMOVAL Left 05/13/2015   Procedure: REMOVAL PORT-A-CATH;  Surgeon: [name], MD;  Location: [place];  Service: General;  Laterality: Left;  . PORTACATH PLACEMENT Left 11/06/2014   Procedure: INSERTION PORT-A-CATH;  Surgeon: [name], MD;  Location: [place];  Service: General;  Laterality: Left;  . SIMPLE MASTECTOMY WITH AXILLARY SENTINEL NODE BIOPSY Right 05/13/2015   Procedure: RIGHT TOTAL  MASTECTOMY WITH RIGHT  AXILLARY SENTINEL NODE BIOPSY;  Surgeon: [name], MD;  Location: [place];  Service: General;  Laterality: Right;  . TUBAL LIGATION     Social History   Social History  . Marital status: Single    Spouse name: N/A  . Number of children: 2  . Years of education: 12   Occupational History  . [employer]  History Main Topics  . Smoking status: Never Smoker  . Smokeless tobacco: Never Used  . Alcohol use No  . Drug use: No  . Sexual activity: Not on file   Other Topics Concern  . 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    Patient is single and lives alone.    Patient is right handed.    Allergies  Allergen Reactions  . Ace Inhibitors Anaphylaxis     Angioedema (tongue swelling)  . Clarithromycin Anaphylaxis    Throat swells  . Levaquin [Levofloxacin] Other (See Comments)    Tendon pain - shoulder and calf   Family History  Problem Relation Age of Onset  . Coronary artery disease Mother   . Heart disease Mother   . Diabetes Mother   . Alcohol abuse Father   . COPD Maternal Grandmother   . Hypertension Other   . Hyperlipidemia Other   . Diabetes Other   . Colon cancer Neg Hx     Past medical history, social, surgical and family history all reviewed in electronic medical record.  No pertanent information unless stated regarding to the chief complaint.   Review of Systems:Review of systems updated and as accurate as of 11/28/16  No headache, visual changes,  nausea, vomiting, diarrhea, constipation, dizziness, abdominal pain, skin rash, fevers, chills, night sweats, weight loss, swollen lymph nodes, , chest pain, shortness of breath, mood changes. Positive for muscle aches  Objective  Last menstrual period 11/01/2012. Systems examined below as of 11/28/16   General: No apparent distress alert and oriented x3 mood and affect normal, dressed appropriately.  HEENT: Pupils equal, extraocular movements intact  Respiratory: Patient's speak in full sentences and does not appear short of breath  Cardiovascular: No lower extremity edema, non tender, no erythema  Skin: Warm dry intact with no signs of infection or rash on extremities or on axial skeleton.  Abdomen: Soft nontender    Neuro: Cranial nerves II through XII are intact, neurovascularly intact in all extremities with 2+ DTRs and 2+ pulses.  Lymph: No lymphadenopathy of posterior or anterior cervical chain or axillae bilaterally.  Gait antalgic gait noted MSK:  Non tender with full range of motion and good stability and symmetric strength and tone of shoulders, elbows, wrist,  knee and bilaterally.  Severe over pronation of the hindfoot bilaterally. Right greater than left. Patient does have tenderness over the posterior tibialis tendon. Pes planus noted Hip: Left ROM IR: 10 Deg with pain with internal range of motion., ER: 35 Deg, Flexion: 120 Deg, Extension: 100 Deg, Abduction: 45 Deg, Adduction: 25 Deg Strength IR: 5/5, ER: 5/5, Flexion: 5/5, Extension: 5/5, Abduction: 4/5, Adduction: 5/5 Pelvic alignment unremarkable to inspection and palpation. Greater trochanter without tenderness to palpation. No tenderness over piriformis and greater trochanter. Severe pain with internal rotation.. No SI joint tenderness and normal minimal SI movement. Contralateral hip is fairly unremarkable.     Impression and Recommendations:     This case required medical decision making of moderate complexity.      Note: This dictation was prepared with Dragon dictation along with smaller phrase technology. Any transcriptional errors that result from this process are unintentional.

## 2016-11-29 ENCOUNTER — Ambulatory Visit: Payer: BC Managed Care – PPO | Admitting: Family Medicine

## 2016-12-07 ENCOUNTER — Encounter: Payer: Self-pay | Admitting: Nurse Practitioner

## 2016-12-07 ENCOUNTER — Ambulatory Visit (INDEPENDENT_AMBULATORY_CARE_PROVIDER_SITE_OTHER): Payer: BC Managed Care – PPO | Admitting: Nurse Practitioner

## 2016-12-07 VITALS — BP 132/80 | HR 87 | Ht 64.0 in | Wt 224.8 lb

## 2016-12-07 DIAGNOSIS — G35 Multiple sclerosis: Secondary | ICD-10-CM

## 2016-12-07 MED ORDER — COPAXONE 40 MG/ML ~~LOC~~ SOSY: PREFILLED_SYRINGE | SUBCUTANEOUS | 3 refills | Status: DC

## 2016-12-07 NOTE — Progress Notes (Signed)
I agree with the assessment and plan as directed by NP .The patient is known to me .   Johnedward Brodrick, MD  

## 2016-12-07 NOTE — Progress Notes (Signed)
[facility]  PATIENT: [name] [name] DOB: Jul 08, 1961   REASON FOR VISIT: Follow-up for relapsing remitting multiple sclerosis HISTORY FROM: Patient    HISTORY OF PRESENT ILLNESS:UPDATE 12/07/2016.CM. [name], 56 year old female returns for follow-up with history of multiple sclerosis relapsing remitting. Her last visit with me was in 2014 and then she was seen by Dr. Doyle Askew at [facility] for a while. Patient is currently on Copaxone 3 times a week and she denies injection site issues.  She denies any side effects to Copaxone. She had previously been on Avonex. She has had no relapse of symptoms or exacerbation in several years. She was diagnosed with breast cancer in 2016 and received 20 rounds of chemotherapy. She is back to work full-time. She denies any new onset weakness sensory changes visual disturbance speech or swallowing problems bowel or bladder difficulty. She denies any falls. She denies any balance issues. She returns for reevaluation  HISTORY 11/03/15 CDKatherine [name] is a 56 y.o. female  Is seen here as a referral  from Dr. Asa Lente for MS care, headaches and re-establish care at [facility].  The patient has made this appointment by phone. Over the last 3 years , the patient had been followed by Dr. Doyle Askew , neurologist at [facility]  She has MRIs at [facility], and was in the meantime diagnosed with breast cancer.  She received the diagnosed on February 9th 2016 .  The patient had not been seen in our practice since Epic has been implemented. Her last visit was with Cecille Rubin on 10/30/2012, the last visit prior to that January  24th 2011.  She was on Copaxone and use it without injection site problems at the time she gets patient assistance through [facility] prior to Copaxone she was on very brisk but could not tolerate the medication she had also been on Avonex and developed side effects she remains on Copaxone now. She has switched to  Copaxone 3 times weekly injection over 3 years ago and she had no exacerbation or relapse in several years. Since she was diagnosed with breast cancer 2016 she has undergone chemotherapy and a right breast mastectomy.  She has no acute problems and wants to be followed again at [facility] for MS.   REVIEW OF SYSTEMS: Full 14 system review of systems performed and notable only for those listed, all others are neg:  Constitutional: neg  Cardiovascular: neg Ear/Nose/Throat: neg  Skin: neg Eyes: neg Respiratory: neg Gastroitestinal: neg  Hematology/Lymphatic: neg  Endocrine: neg Musculoskeletal:neg Allergy/Immunology: neg Neurological: neg Psychiatric: neg Sleep : neg   ALLERGIES: Allergies  Allergen Reactions  . Ace Inhibitors Anaphylaxis     Angioedema (tongue swelling)  . Clarithromycin Anaphylaxis    Throat swells  . Levaquin [Levofloxacin] Other (See Comments)    Tendon pain - shoulder and calf    HOME MEDICATIONS: Outpatient Medications Prior to Visit  Medication Sig Dispense Refill  . acetaminophen (TYLENOL) 500 MG tablet Take 1,000 mg by mouth 2 (two) times daily as needed (pain). Reported on 09/23/2015    . ALPRAZolam (XANAX) 0.5 MG tablet Take 1 tablet (0.5 mg total) by mouth 2 (two) times daily as needed for anxiety or sleep. 90 tablet 0  . b complex vitamins tablet Take 1 tablet by mouth daily.    [name] COPAXONE 40 MG/ML SOSY INJECT ONE SYRINGE (40 MG) SUBCUTANEOUSLY THREE TIMES PER WEEK AT LEAST 48 HOURS APART. ALLOW SYRINGE TO WARM TO ROOM TEMPERATURE FOR 20 MIN 12 Syringe 0  . Diclofenac  Sodium 2 % SOLN Place 2 g onto the skin 2 (two) times daily. 112 g 3  . ferrous gluconate (FERGON) 324 MG tablet Take 1 tablet (324 mg total) by mouth 2 (two) times daily with a meal. 180 tablet 3  . fluticasone (FLONASE) 50 MCG/ACT nasal spray USE 2 SPRAYS IN EACH       NOSTRIL DAILY 16 g 6  . gabapentin (NEURONTIN) 100 MG capsule 200 mg in morning and afternoon, 300 mg at night 630 capsule  1  . hydrochlorothiazide (HYDRODIURIL) 25 MG tablet TAKE 1 TABLET DAILY 90 tablet 2  . HYDROcodone-homatropine (HYCODAN) 5-1.5 MG/5ML syrup Take 5 mLs by mouth every 8 (eight) hours as needed for cough. 120 mL 0  . KLOR-CON SPRINKLE 10 MEQ CR capsule TAKE 2 CAPSULES (20MEQ     TOTAL) TWICE DAILY 180 capsule 1  . loratadine-pseudoephedrine (CLARITIN-D 12 HOUR) 5-120 MG tablet Take 1 tablet by mouth daily as needed (seasonal allergies). 30 tablet 3  . losartan (COZAAR) 50 MG tablet Take 1 tablet (50 mg total) by mouth 2 (two) times daily. 180 tablet 1  . metoprolol tartrate (LOPRESSOR) 25 MG tablet Take 1 tablet (25 mg total) by mouth 2 (two) times daily. 180 tablet 3  . pantoprazole (PROTONIX) 40 MG tablet Take 1 tablet (40 mg total) by mouth daily. 90 tablet 1  . Vitamin D, Ergocalciferol, (DRISDOL) 50000 units CAPS capsule Take 1 capsule (50,000 Units total) by mouth every 7 (seven) days. 12 capsule 0  . cyclobenzaprine (FLEXERIL) 5 MG tablet Take 1-2 tablets (5-10 mg total) by mouth 3 (three) times daily as needed for muscle spasms. (Patient not taking: Reported on 12/07/2016) 30 tablet 0  . ibuprofen (ADVIL,MOTRIN) 800 MG tablet Take 1 tablet (800 mg total) by mouth every 8 (eight) hours as needed. (Patient not taking: Reported on 12/07/2016) 30 tablet 0   No facility-administered medications prior to visit.     PAST MEDICAL HISTORY: Past Medical History:  Diagnosis Date  . Allergic rhinitis due to other allergen   . Allergy   . Angioedema    ACEI  . Anxiety   . Breast cancer of upper-outer quadrant of right female breast ([place]) 10/23/2014   s/p r mastectomy, neoadj chemo completed 04/21/15; neg genetic testing  . Colon polyps 05/2014   adenomatous, colo q 5y  . CONSTIPATION, CHRONIC   . GERD   . Hot flashes   . HYPERLIPIDEMIA   . HYPERTENSION   . Migraine headache   . Multiple sclerosis ([place])   . OBESITY, TRUNCAL     PAST SURGICAL HISTORY: Past Surgical History:  Procedure  Laterality Date  . BREAST BIOPSY Right    [place]   . COLONOSCOPY    . MASTECTOMY Right   . PORT-A-CATH REMOVAL Left 05/13/2015   Procedure: REMOVAL PORT-A-CATH;  Surgeon: [name], MD;  Location: [place];  Service: General;  Laterality: Left;  . PORTACATH PLACEMENT Left 11/06/2014   Procedure: INSERTION PORT-A-CATH;  Surgeon: [name], MD;  Location: [place];  Service: General;  Laterality: Left;  . SIMPLE MASTECTOMY WITH AXILLARY SENTINEL NODE BIOPSY Right 05/13/2015   Procedure: RIGHT TOTAL  MASTECTOMY WITH RIGHT  AXILLARY SENTINEL NODE BIOPSY;  Surgeon: [name], MD;  Location: [place];  Service: General;  Laterality: Right;  . TUBAL LIGATION      FAMILY HISTORY: Family History  Problem Relation Age of Onset  . Coronary artery disease Mother   . Heart disease Mother   .  Diabetes Mother   . Alcohol abuse Father   . COPD Maternal Grandmother   . Hypertension Other   . Hyperlipidemia Other   . Diabetes Other   . Colon cancer Neg Hx     SOCIAL HISTORY: Social History   Social History  . Marital status: Single    Spouse name: N/A  . Number of children: 2  . Years of education: 12   Occupational History  . [place] History Main Topics  . Smoking status: Never Smoker  . Smokeless tobacco: Never Used  . Alcohol use No  . Drug use: No  . Sexual activity: Not on file   Other Topics Concern  . 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    Patient is single and lives alone.    Patient is right handed.      PHYSICAL EXAM  Vitals:   12/07/16 1257  BP: 132/80  Pulse: 87  Weight: 224 lb 12.8 oz (102 kg)  Height: 5\' 4"  (1.626 m)   Body mass index is 38.59 kg/m.  Generalized: Well developed, Obese female in no acute distress  Head: normocephalic and atraumatic,. Oropharynx benign  Neck: Supple,  no carotid bruits  Cardiac: Regular rate rhythm, no murmur  Musculoskeletal: No deformity   Neurological examination   Mentation: Alert oriented to time, place, history taking. Attention span and concentration appropriate. Recent and remote memory intact.  Follows all commands speech and language fluent.   Cranial nerve II-XII: Visual acuity 20/30 right 20/20 left Fundoscopic exam reveals sharp disc margins.Pupils were equal round reactive to light extraocular movements were full, visual field were full on confrontational test. Facial sensation and strength were normal. hearing was intact to finger rubbing bilaterally. Uvula tongue midline. head turning and shoulder shrug were normal and symmetric.Tongue protrusion into cheek strength was normal. Motor: normal bulk and tone, full strength in the BUE, BLE, fine finger movements normal, no pronator drift. No focal weakness Sensory: normal and symmetric to light touch, pinprick, and  Vibration, in the upper and lower extremities Coordination: finger-nose-finger, heel-to-shin bilaterally, no dysmetria, no tremor Reflexes: Symmetric upper and lower, plantar responses were flexor bilaterally. Gait and Station: Rising up from seated position without assistance, normal stance,  moderate stride, good arm swing, smooth turning, able to perform tiptoe, and heel walking without difficulty. Tandem gait is steady  DIAGNOSTIC DATA (LABS, IMAGING, TESTING) - I reviewed patient records, labs, notes, testing and imaging myself where available.  Lab Results  Component Value Date   WBC 6.1 05/30/2016   HGB 12.6 05/30/2016   HCT 36.7 05/30/2016   MCV 87.9 05/30/2016   PLT 238.0 05/30/2016      Component Value Date/Time   NA 140 11/06/2016 1503   K 3.6 11/06/2016 1503   CL 103 05/30/2016 1619   CO2 25 11/06/2016 1503   GLUCOSE 98 11/06/2016 1503   BUN 14.0 11/06/2016 1503   CREATININE 0.9 11/06/2016 1503   CALCIUM 9.7 11/06/2016 1503   PROT 7.5  11/06/2016 1503   ALBUMIN 4.0 11/06/2016 1503   AST 18 11/06/2016 1503   ALT 21 11/06/2016 1503   ALKPHOS 115 11/06/2016 1503   BILITOT 0.28 11/06/2016 1503   GFRNONAA >60 05/14/2015 0327   GFRAA >60 05/14/2015 0327       ASSESSMENT AND PLAN  56 y.o. year old female  has  a past medical history of  Migraine headache; Multiple sclerosis ([place]); and OBESITY, . here To follow-up for her relapsing remitting multiple sclerosis which is currently stable.  PLAN: Continue Copaxone 3 times weekly will refill Exercise for overall health, healthy diet with lean meats fresh fruits and vegetables Call for exacerbation of symptoms Follow up in 1 year Will repeat MRI of the brain at that time I spent 20 min in total face to face time with the patient more than 50% of which was spent counseling and coordination of care, reviewing test results reviewing medications and discussing and reviewing the diagnosis of relapsing remitting multiple sclerosis and further treatment options. Patient wants to continue on Copaxone for now , she was made aware that there are oral options Dennie Bible, [facility], [facility], APRN  [facility] [address] [place], [place] [address] [phone]

## 2016-12-07 NOTE — Patient Instructions (Signed)
Continue Copaxone 3 times weekly will refill Exercise for overall health Call for exacerbation of symptoms Follow up in 1 year

## 2016-12-10 ENCOUNTER — Encounter: Payer: Self-pay | Admitting: Internal Medicine

## 2016-12-11 ENCOUNTER — Ambulatory Visit (INDEPENDENT_AMBULATORY_CARE_PROVIDER_SITE_OTHER): Payer: BC Managed Care – PPO | Admitting: Internal Medicine

## 2016-12-11 ENCOUNTER — Encounter: Payer: Self-pay | Admitting: Internal Medicine

## 2016-12-11 VITALS — BP 134/84 | HR 102 | Temp 98.5°F | Resp 16 | Wt 224.0 lb

## 2016-12-11 DIAGNOSIS — B029 Zoster without complications: Secondary | ICD-10-CM

## 2016-12-11 HISTORY — DX: Zoster without complications: B02.9

## 2016-12-11 MED ORDER — VALACYCLOVIR HCL 1 G PO TABS: 1000.0000 mg | ORAL_TABLET | Freq: Three times a day (TID) | ORAL | 0 refills | Status: DC

## 2016-12-11 MED ORDER — CLOBETASOL PROP EMOLLIENT BASE 0.05 % EX CREA: 1.0000 "application " | TOPICAL_CREAM | Freq: Two times a day (BID) | CUTANEOUS | 0 refills | Status: DC

## 2016-12-11 NOTE — Telephone Encounter (Signed)
Rec'd call from pt stating she sent MD a email. She states she have shingles on her (R) arm. Blister pop up on Thurs...Johny Chess

## 2016-12-11 NOTE — Progress Notes (Signed)
Subjective:    Patient ID: [name] [name], female    DOB: [birth year], 56 y.o.   MRN: [id number]  HPI She is here for an acute visit.   She started having a sensation on her right arm  - it was very sensitive to touch 5 days ago.  She thought she was getting lymphedema.  Two days ago she noticed a blistery rash on the right arm.  She has blisters on the upper arm, a cluster on the forearm and a couple of lesions on her fingers.. She also has a small cluster on the right center chest.  She started taking acyclovir she had at home.  She denies numbness/tingling in the arm.  She just has pain.  She denies fever, chills.  She has had some fatigue.    She is taking tylenol and advil for the pain.   Medications and allergies reviewed with patient and updated if appropriate.  Patient Active Problem List   Diagnosis Date Noted  . Left hip pain 10/31/2016  . Pes planus 10/31/2016  . Posterior tibialis muscle dysfunction 10/31/2016  . Muscle strain 10/21/2016  . Upper respiratory tract infection 09/23/2016  . Depression 09/03/2016  . Memory difficulties 09/03/2016  . Breast pain, right 08/24/2016  . Chronic fatigue 06/08/2016  . Cough 04/25/2016  . Palpitations 04/04/2016  . 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 Malignant neoplasm of upper-outer quadrant of right breast in female, 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[place]) 11/03/2015  . S/P mastectomy 05/13/2015  . 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  . 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 Genetic testing 12/11/2014  . Chemotherapy induced neutropenia ([place]) 11/26/2014  . Breast cancer of upper-outer quadrant of right female breast ([place]) 10/23/2014  . Anxiety 04/29/2012  . Allergic rhinitis 11/29/2009  . 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 CONSTIPATION, CHRONIC 08/17/2008  . Dyslipidemia 04/24/2008  . METABOLIC SYNDROME X 65/46/5035  . GERD 01/07/2008  . OBESITY, TRUNCAL  09/20/2007  . Essential hypertension 09/20/2007  . Multiple sclerosis ([place]) 09/18/2007    Current Outpatient Prescriptions on File Prior to Visit  Medication Sig Dispense Refill  . acetaminophen (TYLENOL) 500 MG tablet Take 1,000 mg by mouth 2 (two) times daily as needed (pain). Reported on 09/23/2015    . ALPRAZolam (XANAX) 0.5 MG tablet Take 1 tablet (0.5 mg total) by mouth 2 (two) times daily as needed for anxiety or sleep. 90 tablet 0  . b complex vitamins tablet Take 1 tablet by mouth daily.    [name] COPAXONE 40 MG/ML SOSY INJECT ONE SYRINGE (40 MG) SUBCUTANEOUSLY THREE TIMES PER WEEK AT LEAST 48 HOURS APART. ALLOW SYRINGE TO WARM TO ROOM TEMPERATURE FOR 20 MIN 36 Syringe 3  . Diclofenac Sodium 2 % SOLN Place 2 g onto the skin 2 (two) times daily. 112 g 3  . ferrous gluconate (FERGON) 324 MG tablet Take 1 tablet (324 mg total) by mouth 2 (two) times daily with a meal. 180 tablet 3  . fluticasone (FLONASE) 50 MCG/ACT nasal spray USE 2 SPRAYS IN EACH       NOSTRIL DAILY 16 g 6  . gabapentin (NEURONTIN) 100 MG capsule 200 mg in morning and afternoon, 300 mg at night 630 capsule 1  . hydrochlorothiazide (HYDRODIURIL) 25 MG tablet TAKE 1 TABLET DAILY 90 tablet 2  . HYDROcodone-homatropine (HYCODAN) 5-1.5 MG/5ML syrup Take 5 mLs by mouth every 8 (eight) hours as needed for cough. 120 mL 0  . KLOR-CON SPRINKLE 10 MEQ CR capsule  TAKE 2 CAPSULES (20MEQ     TOTAL) TWICE DAILY 180 capsule 1  . loratadine-pseudoephedrine (CLARITIN-D 12 HOUR) 5-120 MG tablet Take 1 tablet by mouth daily as needed (seasonal allergies). 30 tablet 3  . losartan (COZAAR) 50 MG tablet Take 1 tablet (50 mg total) by mouth 2 (two) times daily. 180 tablet 1  . metoprolol tartrate (LOPRESSOR) 25 MG tablet Take 1 tablet (25 mg total) by mouth 2 (two) times daily. 180 tablet 3  . pantoprazole (PROTONIX) 40 MG tablet Take 1 tablet (40 mg total) by mouth daily. 90 tablet 1  . Vitamin D, Ergocalciferol, (DRISDOL) 50000 units CAPS capsule  Take 1 capsule (50,000 Units total) by mouth every 7 (seven) days. 12 capsule 0  . [DISCONTINUED] mometasone (NASONEX) 50 MCG/ACT nasal spray Place 2 sprays into the nose daily. 17 g 2   No current facility-administered medications on file prior to visit.     Past Medical History:  Diagnosis Date  . Allergic rhinitis due to other allergen   . Allergy   . Angioedema    ACEI  . Anxiety   . Breast cancer of upper-outer quadrant of right female breast ([place]) 10/23/2014   s/p r mastectomy, neoadj chemo completed 04/21/15; neg genetic testing  . Colon polyps 05/2014   adenomatous, colo q 5y  . CONSTIPATION, CHRONIC   . GERD   . Hot flashes   . HYPERLIPIDEMIA   . HYPERTENSION   . Migraine headache   . Multiple sclerosis ([place])   . OBESITY, TRUNCAL     Past Surgical History:  Procedure Laterality Date  . BREAST BIOPSY Right    [place]   . COLONOSCOPY    . MASTECTOMY Right   . PORT-A-CATH REMOVAL Left 05/13/2015   Procedure: REMOVAL PORT-A-CATH;  Surgeon: [name], MD;  Location: [place];  Service: General;  Laterality: Left;  . PORTACATH PLACEMENT Left 11/06/2014   Procedure: INSERTION PORT-A-CATH;  Surgeon: [name], MD;  Location: [place];  Service: General;  Laterality: Left;  . SIMPLE MASTECTOMY WITH AXILLARY SENTINEL NODE BIOPSY Right 05/13/2015   Procedure: RIGHT TOTAL  MASTECTOMY WITH RIGHT  AXILLARY SENTINEL NODE BIOPSY;  Surgeon: [name], MD;  Location: [place];  Service: General;  Laterality: Right;  . TUBAL LIGATION      Social History   Social History  . Marital status: Single    Spouse name: N/A  . Number of children: 2  . Years of education: 12   Occupational History  . [place] History Main Topics  . Smoking status: Never Smoker  . Smokeless tobacco: Never Used  . Alcohol use No  . Drug use: No  . Sexual activity: Not Asked   Other Topics Concern  . 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    Patient is single and lives alone.    Patient is right handed.     Family History  Problem Relation Age of Onset  . Coronary artery disease Mother   . Heart disease Mother   . Diabetes Mother   . Alcohol abuse Father   . COPD Maternal Grandmother   . Hypertension Other   . Hyperlipidemia Other   . Diabetes Other   . Colon cancer Neg Hx     Review of Systems  Constitutional: Positive for fatigue. Negative for chills and fever.  Skin:  Positive for rash. Negative for wound.  Neurological: Positive for headaches (occ). Negative for weakness and numbness.       Objective:   Vitals:   12/11/16 1555  BP: 134/84  Pulse: (!) 102  Resp: 16  Temp: 98.5 F (36.9 C)   Filed Weights   12/11/16 1555  Weight: 224 lb (101.6 kg)   Body mass index is 38.45 kg/m.  Wt Readings from Last 3 Encounters:  12/11/16 224 lb (101.6 kg)  12/07/16 224 lb 12.8 oz (102 kg)  11/13/16 220 lb 6.4 oz (100 kg)     Physical Exam  Constitutional: She appears well-developed and well-nourished. No distress.  HENT:  Head: Normocephalic and atraumatic.  Musculoskeletal: She exhibits no edema.  Skin: Skin is warm and dry. She is not diaphoretic.  Cluster of blister on right upper arm, right forearm, fingers and center chest, each with mild surrounding erythema, no open blister or discharge, no swelling          Assessment & Plan:   See Problem List for Assessment and Plan of chronic medical problems.

## 2016-12-11 NOTE — Assessment & Plan Note (Signed)
Shingles w/o complication - no evidence of infection Pain managed by tylenol / advil - discussed that if she wants something stronger I can prescribed something - she prefers to avoid narcotics She is already on gabapentin Start valtrex 1000mg  TID x 1 week Call if no improvement or any questions Note to be out of work tomorrow given

## 2016-12-11 NOTE — Patient Instructions (Signed)
Start valtrex 1000 mg three times a day for one week  You can apply the steroid cream twice if needed.     Shingles Shingles, which is also known as herpes zoster, is an infection that causes a painful skin rash and fluid-filled blisters. Shingles is not related to genital herpes, which is a sexually transmitted infection. Shingles only develops in people who:  Have had chickenpox.  Have received the chickenpox vaccine. (This is rare.) What are the causes? Shingles is caused by varicella-zoster virus (VZV). This is the same virus that causes chickenpox. After exposure to VZV, the virus stays in the body in an inactive (dormant) state. Shingles develops if the virus reactivates. This can happen many years after the initial exposure to VZV. It is not known what causes this virus to reactivate. What increases the risk? People who have had chickenpox or received the chickenpox vaccine are at risk for shingles. Infection is more common in people who:  Are older than age 21.  Have a weakened defense (immune) system, such as those with HIV, AIDS, or cancer.  Are taking medicines that weaken the immune system, such as transplant medicines.  Are under great stress. What are the signs or symptoms? Early symptoms of this condition include itching, tingling, and pain in an area on your skin. Pain may be described as burning, stabbing, or throbbing. A few days or weeks after symptoms start, a painful red rash appears, usually on one side of the body in a bandlike or beltlike pattern. The rash eventually turns into fluid-filled blisters that break open, scab over, and dry up in about 2-3 weeks. At any time during the infection, you may also develop:  A fever.  Chills.  A headache.  An upset stomach. How is this diagnosed? This condition is diagnosed with a skin exam. Sometimes, skin or fluid samples are taken from the blisters before a diagnosis is made. These samples are examined under a  microscope or sent to a lab for testing. How is this treated? There is no specific cure for this condition. Your health care provider will probably prescribe medicines to help you manage pain, recover more quickly, and avoid long-term problems. Medicines may include:  Antiviral drugs.  Anti-inflammatory drugs.  Pain medicines. If the area involved is on your face, you may be referred to a specialist, such as an eye doctor (ophthalmologist) or an ear, nose, and throat (ENT) doctor to help you avoid eye problems, chronic pain, or disability. Follow these instructions at home: Medicines   Take medicines only as directed by your health care provider.  Apply an anti-itch or numbing cream to the affected area as directed by your health care provider. Blister and Rash Care   Take a cool bath or apply cool compresses to the area of the rash or blisters as directed by your health care provider. This may help with pain and itching.  Keep your rash covered with a loose bandage (dressing). Wear loose-fitting clothing to help ease the pain of material rubbing against the rash.  Keep your rash and blisters clean with mild soap and cool water or as directed by your health care provider.  Check your rash every day for signs of infection. These include redness, swelling, and pain that lasts or increases.  Do not pick your blisters.  Do not scratch your rash. General instructions   Rest as directed by your health care provider.  Keep all follow-up visits as directed by your health care provider.  This is important.  Until your blisters scab over, your infection can cause chickenpox in people who have never had it or been vaccinated against it. To prevent this from happening, avoid contact with other people, especially:  Babies.  Pregnant women.  Children who have eczema.  Elderly people who have transplants.  People who have chronic illnesses, such as leukemia or AIDS. Contact a health  care provider if:  Your pain is not relieved with prescribed medicines.  Your pain does not get better after the rash heals.  Your rash looks infected. Signs of infection include redness, swelling, and pain that lasts or increases. Get help right away if:  The rash is on your face or nose.  You have facial pain, pain around your eye area, or loss of feeling on one side of your face.  You have ear pain or you have ringing in your ear.  You have loss of taste.  Your condition gets worse. This information is not intended to replace advice given to you by your health care provider. Make sure you discuss any questions you have with your health care provider. Document Released: 09/04/2005 Document Revised: 04/30/2016 Document Reviewed: 07/16/2014 Elsevier Interactive Patient Education  2017 [employer].

## 2016-12-11 NOTE — Progress Notes (Signed)
Pre visit review using our clinic review tool, if applicable. No additional management support is needed unless otherwise documented below in the visit note. 

## 2016-12-11 NOTE — Telephone Encounter (Signed)
Pt called in again about this, she is in a lot of pain and wants to know what can she do?

## 2016-12-12 NOTE — Telephone Encounter (Signed)
Pt was seen 12/11/2016 by Dr Quay Burow.

## 2016-12-19 ENCOUNTER — Telehealth: Payer: Self-pay | Admitting: Internal Medicine

## 2016-12-19 DIAGNOSIS — E6609 Other obesity due to excess calories: Secondary | ICD-10-CM

## 2016-12-19 DIAGNOSIS — Z6838 Body mass index (BMI) 38.0-38.9, adult: Principal | ICD-10-CM

## 2016-12-19 NOTE — Telephone Encounter (Signed)
ordered

## 2016-12-19 NOTE — Telephone Encounter (Signed)
Please advise 

## 2016-12-19 NOTE — Telephone Encounter (Signed)
Pt called in and wants to see if she can be referred to:     Dennard Nip, MD [facility]   Pt wants to learn to management weight

## 2016-12-20 NOTE — Telephone Encounter (Signed)
Spoke with pt to inform.  

## 2016-12-22 ENCOUNTER — Telehealth: Payer: Self-pay | Admitting: Internal Medicine

## 2016-12-22 NOTE — Telephone Encounter (Signed)
Pt called in and stated the outside on the skin has healed from her shingles but not she is having pain under the skin.  She would like to know if dr can call in meds to help with the pain today

## 2016-12-24 ENCOUNTER — Other Ambulatory Visit: Payer: Self-pay | Admitting: Internal Medicine

## 2016-12-25 NOTE — Telephone Encounter (Signed)
Please advise 

## 2016-12-25 NOTE — Telephone Encounter (Signed)
Let her know she does not need a refill - it will not help with decreasing pain further.

## 2017-01-04 ENCOUNTER — Encounter (INDEPENDENT_AMBULATORY_CARE_PROVIDER_SITE_OTHER): Payer: BC Managed Care – PPO | Admitting: Family Medicine

## 2017-01-08 ENCOUNTER — Encounter: Payer: Self-pay | Admitting: Internal Medicine

## 2017-01-08 ENCOUNTER — Ambulatory Visit (INDEPENDENT_AMBULATORY_CARE_PROVIDER_SITE_OTHER): Payer: BC Managed Care – PPO | Admitting: Internal Medicine

## 2017-01-08 VITALS — BP 104/70 | HR 88 | Temp 98.3°F | Resp 16 | Wt 223.0 lb

## 2017-01-08 DIAGNOSIS — Z23 Encounter for immunization: Secondary | ICD-10-CM

## 2017-01-08 DIAGNOSIS — J0100 Acute maxillary sinusitis, unspecified: Secondary | ICD-10-CM | POA: Diagnosis not present

## 2017-01-08 HISTORY — DX: Acute maxillary sinusitis, unspecified: J01.00

## 2017-01-08 MED ORDER — AMOXICILLIN-POT CLAVULANATE 875-125 MG PO TABS: 1.0000 | ORAL_TABLET | Freq: Two times a day (BID) | ORAL | 0 refills | Status: DC

## 2017-01-08 MED ORDER — FLUCONAZOLE 150 MG PO TABS
150.0000 mg | ORAL_TABLET | Freq: Once | ORAL | 0 refills | Status: AC
Start: 2017-01-08 — End: 2017-01-08

## 2017-01-08 NOTE — Progress Notes (Signed)
Subjective:    Patient ID: [name] [name], female    DOB: 1961-04-16, 56 y.o.   MRN: [id number]  HPI She is here for an acute visit.   She wants to get the shingles vaccine. He has recovered from the shingles that she had right arm. She denies any pain.  ? Sinus infection: For just over one week she has been experiencing headaches, sinus pressure in her left orbital region, postnasal drip with discolored mucus, cough that is productive at times that is also discolored, lightheadedness and dizziness. She denies any fevers. She denies ear pain and sore throat significant congestion. She has not had any shortness of breath or wheezing.   Medications and allergies reviewed with patient and updated if appropriate.  Patient Active Problem List   Diagnosis Date Noted  . Herpes zoster without complication 40/98/1191  . Left hip pain 10/31/2016  . Pes planus 10/31/2016  . Posterior tibialis muscle dysfunction 10/31/2016  . Muscle strain 10/21/2016  . Upper respiratory tract infection 09/23/2016  . Depression 09/03/2016  . Memory difficulties 09/03/2016  . Breast pain, right 08/24/2016  . Chronic fatigue 06/08/2016  . Cough 04/25/2016  . Palpitations 04/04/2016  . 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 Malignant neoplasm of upper-outer quadrant of right breast in female, 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[place]) 11/03/2015  . S/P mastectomy 05/13/2015  . Chemotherapy-induced neuropathy ([place]) 04/21/2015  . 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  . 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 Genetic testing 12/11/2014  . Chemotherapy induced neutropenia ([place]) 11/26/2014  . Breast cancer of upper-outer quadrant of right female breast ([place]) 10/23/2014  . Anxiety 04/29/2012  . Allergic rhinitis 11/29/2009  . 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 CONSTIPATION, CHRONIC 08/17/2008  . Dyslipidemia 04/24/2008  . METABOLIC SYNDROME X 47/82/9562  . GERD 01/07/2008  . OBESITY, TRUNCAL 09/20/2007  .  Essential hypertension 09/20/2007  . Multiple sclerosis ([place]) 09/18/2007    Current Outpatient Prescriptions on File Prior to Visit  Medication Sig Dispense Refill  . acetaminophen (TYLENOL) 500 MG tablet Take 1,000 mg by mouth 2 (two) times daily as needed (pain). Reported on 09/23/2015    . ALPRAZolam (XANAX) 0.5 MG tablet Take 1 tablet (0.5 mg total) by mouth 2 (two) times daily as needed for anxiety or sleep. 90 tablet 0  . b complex vitamins tablet Take 1 tablet by mouth daily.    . Clobetasol Prop Emollient Base (CLOBETASOL PROPIONATE E) 0.05 % emollient cream Apply 1 application topically 2 (two) times daily. 30 g 0  . COPAXONE 40 MG/ML SOSY INJECT ONE SYRINGE (40 MG) SUBCUTANEOUSLY THREE TIMES PER WEEK AT LEAST 48 HOURS APART. ALLOW SYRINGE TO WARM TO ROOM TEMPERATURE FOR 20 MIN 36 Syringe 3  . Diclofenac Sodium 2 % SOLN Place 2 g onto the skin 2 (two) times daily. 112 g 3  . ferrous gluconate (FERGON) 324 MG tablet Take 1 tablet (324 mg total) by mouth 2 (two) times daily with a meal. 180 tablet 3  . fluticasone (FLONASE) 50 MCG/ACT nasal spray USE 2 SPRAYS IN EACH       NOSTRIL DAILY 16 g 6  . gabapentin (NEURONTIN) 100 MG capsule 200 mg in morning and afternoon, 300 mg at night 630 capsule 1  . hydrochlorothiazide (HYDRODIURIL) 25 MG tablet TAKE 1 TABLET DAILY 90 tablet 2  . HYDROcodone-homatropine (HYCODAN) 5-1.5 MG/5ML syrup Take 5 mLs by mouth every 8 (eight) hours as needed for cough. 120 mL 0  . KLOR-CON SPRINKLE 10 MEQ CR capsule TAKE  2 CAPSULES (20MEQ     TOTAL) TWICE DAILY 180 capsule 1  . loratadine-pseudoephedrine (CLARITIN-D 12 HOUR) 5-120 MG tablet Take 1 tablet by mouth daily as needed (seasonal allergies). 30 tablet 3  . losartan (COZAAR) 50 MG tablet Take 1 tablet (50 mg total) by mouth 2 (two) times daily. 180 tablet 1  . metoprolol tartrate (LOPRESSOR) 25 MG tablet Take 1 tablet (25 mg total) by mouth 2 (two) times daily. 180 tablet 3  . pantoprazole (PROTONIX) 40 MG  tablet Take 1 tablet (40 mg total) by mouth daily. 90 tablet 1  . valACYclovir (VALTREX) 1000 MG tablet Take 1 tablet (1,000 mg total) by mouth 3 (three) times daily. 21 tablet 0  . Vitamin D, Ergocalciferol, (DRISDOL) 50000 units CAPS capsule Take 1 capsule (50,000 Units total) by mouth every 7 (seven) days. 12 capsule 0  . [DISCONTINUED] mometasone (NASONEX) 50 MCG/ACT nasal spray Place 2 sprays into the nose daily. 17 g 2   No current facility-administered medications on file prior to visit.     Past Medical History:  Diagnosis Date  . Allergic rhinitis due to other allergen   . Allergy   . Angioedema    ACEI  . Anxiety   . Breast cancer of upper-outer quadrant of right female breast ([place]) 10/23/2014   s/p r mastectomy, neoadj chemo completed 04/21/15; neg genetic testing  . Colon polyps 05/2014   adenomatous, colo q 5y  . CONSTIPATION, CHRONIC   . GERD   . Hot flashes   . HYPERLIPIDEMIA   . HYPERTENSION   . Migraine headache   . Multiple sclerosis ([place])   . OBESITY, TRUNCAL     Past Surgical History:  Procedure Laterality Date  . BREAST BIOPSY Right    [place]   . COLONOSCOPY    . MASTECTOMY Right   . PORT-A-CATH REMOVAL Left 05/13/2015   Procedure: REMOVAL PORT-A-CATH;  Surgeon: [name], MD;  Location: [facility];  Service: General;  Laterality: Left;  . PORTACATH PLACEMENT Left 11/06/2014   Procedure: INSERTION PORT-A-CATH;  Surgeon: [name], MD;  Location: [facility];  Service: General;  Laterality: Left;  . SIMPLE MASTECTOMY WITH AXILLARY SENTINEL NODE BIOPSY Right 05/13/2015   Procedure: RIGHT TOTAL  MASTECTOMY WITH RIGHT  AXILLARY SENTINEL NODE BIOPSY;  Surgeon: [name], MD;  Location: [facility];  Service: General;  Laterality: Right;  . TUBAL LIGATION      Social History   Social History  . Marital status: Single    Spouse name: N/A  . Number of children: 2  . Years of education: 12   Occupational History  . [place] History Main Topics  . Smoking status: Never Smoker  . Smokeless tobacco: Never Used  . Alcohol use No  . Drug use: No  . Sexual activity: Not Asked   Other Topics Concern  . 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    Patient is single and lives alone.    Patient is right handed.     Family History  Problem Relation Age of Onset  . Coronary artery disease Mother   . Heart disease Mother   . Diabetes Mother   . Alcohol abuse Father   . COPD Maternal Grandmother   . Hypertension Other   . Hyperlipidemia Other   . Diabetes Other   . Colon cancer  Neg Hx     Review of Systems  Constitutional: Negative for chills and fever.  HENT: Positive for postnasal drip and sinus pressure. Negative for congestion, ear pain and sore throat.   Respiratory: Positive for cough. Negative for shortness of breath and wheezing.   Neurological: Positive for dizziness, light-headedness and headaches.       Objective:   Vitals:   01/08/17 1504  BP: 104/70  Pulse: 88  Resp: 16  Temp: 98.3 F (36.8 C)   Filed Weights   01/08/17 1504  Weight: 223 lb (101.2 kg)   Body mass index is 38.28 kg/m.  Wt Readings from Last 3 Encounters:  01/08/17 223 lb (101.2 kg)  12/11/16 224 lb (101.6 kg)  12/07/16 224 lb 12.8 oz (102 kg)     Physical Exam GENERAL APPEARANCE: Appears stated age, well appearing, NAD EYES: conjunctiva clear, no icterus HEENT: bilateral tympanic membranes and ear canals normal, oropharynx with mild erythema, sinus pressure with palpation, no thyromegaly, trachea midline, no cervical or supraclavicular lymphadenopathy LUNGS: Clear to auscultation without wheeze or crackles, unlabored breathing, good air entry bilaterally HEART: Normal S1,S2 without murmurs EXTREMITIES: Without clubbing, cyanosis, or edema SKIN: Scarring from shingles right upper arm and  forearm        Assessment & Plan:   See Problem List for Assessment and Plan of chronic medical problems.

## 2017-01-08 NOTE — Progress Notes (Signed)
Pre visit review using our clinic review tool, if applicable. No additional management support is needed unless otherwise documented below in the visit note. 

## 2017-01-08 NOTE — Assessment & Plan Note (Signed)
augmentin Symptomatic treatment Rest, fluids Call if no improvement

## 2017-01-08 NOTE — Patient Instructions (Signed)
Take the antibiotic as prescribed. Diflucan was also sent to your pharmacy.   You had the shingles injection today.     Sinusitis, Adult Sinusitis is soreness and inflammation of your sinuses. Sinuses are hollow spaces in the bones around your face. Your sinuses are located:  Around your eyes.  In the middle of your forehead.  Behind your nose.  In your cheekbones. Your sinuses and nasal passages are lined with a stringy fluid (mucus). Mucus normally drains out of your sinuses. When your nasal tissues become inflamed or swollen, the mucus can become trapped or blocked so air cannot flow through your sinuses. This allows bacteria, viruses, and funguses to grow, which leads to infection. Sinusitis can develop quickly and last for 7?10 days (acute) or for more than 12 weeks (chronic). Sinusitis often develops after a cold. What are the causes? This condition is caused by anything that creates swelling in the sinuses or stops mucus from draining, including:  Allergies.  Asthma.  Bacterial or viral infection.  Abnormally shaped bones between the nasal passages.  Nasal growths that contain mucus (nasal polyps).  Narrow sinus openings.  Pollutants, such as chemicals or irritants in the air.  A foreign object stuck in the nose.  A fungal infection. This is rare. What increases the risk? The following factors may make you more likely to develop this condition:  Having allergies or asthma.  Having had a recent cold or respiratory tract infection.  Having structural deformities or blockages in your nose or sinuses.  Having a weak immune system.  Doing a lot of swimming or diving.  Overusing nasal sprays.  Smoking. What are the signs or symptoms? The main symptoms of this condition are pain and a feeling of pressure around the affected sinuses. Other symptoms include:  Upper toothache.  Earache.  Headache.  Bad breath.  Decreased sense of smell and taste.  A  cough that may get worse at night.  Fatigue.  Fever.  Thick drainage from your nose. The drainage is often green and it may contain pus (purulent).  Stuffy nose or congestion.  Postnasal drip. This is when extra mucus collects in the throat or back of the nose.  Swelling and warmth over the affected sinuses.  Sore throat.  Sensitivity to light. How is this diagnosed? This condition is diagnosed based on symptoms, a medical history, and a physical exam. To find out if your condition is acute or chronic, your health care provider may:  Look in your nose for signs of nasal polyps.  Tap over the affected sinus to check for signs of infection.  View the inside of your sinuses using an imaging device that has a light attached (endoscope). If your health care provider suspects that you have chronic sinusitis, you may also:  Be tested for allergies.  Have a sample of mucus taken from your nose (nasal culture) and checked for bacteria.  Have a mucus sample examined to see if your sinusitis is related to an allergy. If your sinusitis does not respond to treatment and it lasts longer than 8 weeks, you may have an MRI or CT scan to check your sinuses. These scans also help to determine how severe your infection is. In rare cases, a bone biopsy may be done to rule out more serious types of fungal sinus disease. How is this treated? Treatment for sinusitis depends on the cause and whether your condition is chronic or acute. If a virus is causing your sinusitis, your symptoms  will go away on their own within 10 days. You may be given medicines to relieve your symptoms, including:  Topical nasal decongestants. They shrink swollen nasal passages and let mucus drain from your sinuses.  Antihistamines. These drugs block inflammation that is triggered by allergies. This can help to ease swelling in your nose and sinuses.  Topical nasal corticosteroids. These are nasal sprays that ease  inflammation and swelling in your nose and sinuses.  Nasal saline washes. These rinses can help to get rid of thick mucus in your nose. If your condition is caused by bacteria, you will be given an antibiotic medicine. If your condition is caused by a fungus, you will be given an antifungal medicine. Surgery may be needed to correct underlying conditions, such as narrow nasal passages. Surgery may also be needed to remove polyps. Follow these instructions at home: Medicines   Take, use, or apply over-the-counter and prescription medicines only as told by your health care provider. These may include nasal sprays.  If you were prescribed an antibiotic medicine, take it as told by your health care provider. Do not stop taking the antibiotic even if you start to feel better. Hydrate and Humidify   Drink enough water to keep your urine clear or pale yellow. Staying hydrated will help to thin your mucus.  Use a cool mist humidifier to keep the humidity level in your home above 50%.  Inhale steam for 10-15 minutes, 3-4 times a day or as told by your health care provider. You can do this in the bathroom while a hot shower is running.  Limit your exposure to cool or dry air. Rest   Rest as much as possible.  Sleep with your head raised (elevated).  Make sure to get enough sleep each night. General instructions   Apply a warm, moist washcloth to your face 3-4 times a day or as told by your health care provider. This will help with discomfort.  Wash your hands often with soap and water to reduce your exposure to viruses and other germs. If soap and water are not available, use hand sanitizer.  Do not smoke. Avoid being around people who are smoking (secondhand smoke).  Keep all follow-up visits as told by your health care provider. This is important. Contact a health care provider if:  You have a fever.  Your symptoms get worse.  Your symptoms do not improve within 10 days. Get help  right away if:  You have a severe headache.  You have persistent vomiting.  You have pain or swelling around your face or eyes.  You have vision problems.  You develop confusion.  Your neck is stiff.  You have trouble breathing. This information is not intended to replace advice given to you by your health care provider. Make sure you discuss any questions you have with your health care provider. Document Released: 09/04/2005 Document Revised: 04/30/2016 Document Reviewed: 06/30/2015 Elsevier Interactive Patient Education  2017 [employer].

## 2017-01-09 ENCOUNTER — Other Ambulatory Visit: Payer: Self-pay | Admitting: *Deleted

## 2017-01-09 MED ORDER — LORATADINE-PSEUDOEPHEDRINE ER 5-120 MG PO TB12: 1.0000 | ORAL_TABLET | Freq: Every day | ORAL | 3 refills | Status: DC | PRN

## 2017-01-09 NOTE — Telephone Encounter (Signed)
Rec'd call pt states she saw MD yesterday, and the assistant was suppose to send refill on her claritin D, but pharmacy never received. Verified chart inform pt will send to [facility]...Johny Chess

## 2017-01-20 ENCOUNTER — Other Ambulatory Visit: Payer: Self-pay | Admitting: Internal Medicine

## 2017-01-22 MED ORDER — POLYETHYLENE GLYCOL 3350 17 G PO PACK: 17.0000 g | PACK | Freq: Every day | ORAL | 11 refills | Status: DC

## 2017-01-22 MED ORDER — FLORAJEN3 PO CAPS: 1.0000 | ORAL_CAPSULE | Freq: Every day | ORAL | 11 refills | Status: DC

## 2017-01-22 NOTE — Telephone Encounter (Signed)
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 Requesting MD to send prescription so she can use. Verified pharmacy inform will send to [facility]...Johny Chess

## 2017-01-29 ENCOUNTER — Telehealth: Payer: Self-pay | Admitting: Internal Medicine

## 2017-01-29 NOTE — Telephone Encounter (Signed)
Taken care of in later phone note. Closing this note.

## 2017-01-29 NOTE — Telephone Encounter (Signed)
Spoke with pt, she is wanting to inquire about estrogen level. Advised her to contact her GYN office for possible blood work.

## 2017-01-29 NOTE — Telephone Encounter (Signed)
Pt would like to know if you could call her on her work number.  She has a question for you

## 2017-02-01 ENCOUNTER — Encounter (INDEPENDENT_AMBULATORY_CARE_PROVIDER_SITE_OTHER): Payer: BC Managed Care – PPO | Admitting: Family Medicine

## 2017-02-05 ENCOUNTER — Other Ambulatory Visit: Payer: Self-pay | Admitting: Internal Medicine

## 2017-02-08 ENCOUNTER — Encounter (INDEPENDENT_AMBULATORY_CARE_PROVIDER_SITE_OTHER): Payer: BC Managed Care – PPO | Admitting: Family Medicine

## 2017-02-21 ENCOUNTER — Ambulatory Visit (INDEPENDENT_AMBULATORY_CARE_PROVIDER_SITE_OTHER): Payer: BC Managed Care – PPO | Admitting: Family Medicine

## 2017-02-21 ENCOUNTER — Encounter (INDEPENDENT_AMBULATORY_CARE_PROVIDER_SITE_OTHER): Payer: Self-pay | Admitting: Family Medicine

## 2017-02-21 VITALS — BP 124/82 | HR 95 | Temp 97.9°F | Ht 65.0 in | Wt 218.0 lb

## 2017-02-21 DIAGNOSIS — Z1389 Encounter for screening for other disorder: Secondary | ICD-10-CM | POA: Diagnosis not present

## 2017-02-21 DIAGNOSIS — R0602 Shortness of breath: Secondary | ICD-10-CM | POA: Diagnosis not present

## 2017-02-21 DIAGNOSIS — E669 Obesity, unspecified: Secondary | ICD-10-CM | POA: Diagnosis not present

## 2017-02-21 DIAGNOSIS — Z9189 Other specified personal risk factors, not elsewhere classified: Secondary | ICD-10-CM

## 2017-02-21 DIAGNOSIS — I10 Essential (primary) hypertension: Secondary | ICD-10-CM

## 2017-02-21 DIAGNOSIS — G35 Multiple sclerosis: Secondary | ICD-10-CM | POA: Diagnosis not present

## 2017-02-21 DIAGNOSIS — Z0289 Encounter for other administrative examinations: Secondary | ICD-10-CM

## 2017-02-21 DIAGNOSIS — Z6836 Body mass index (BMI) 36.0-36.9, adult: Secondary | ICD-10-CM | POA: Diagnosis not present

## 2017-02-21 DIAGNOSIS — R5383 Other fatigue: Secondary | ICD-10-CM | POA: Diagnosis not present

## 2017-02-21 DIAGNOSIS — G35D Multiple sclerosis, unspecified: Secondary | ICD-10-CM

## 2017-02-21 DIAGNOSIS — Z1331 Encounter for screening for depression: Secondary | ICD-10-CM

## 2017-02-21 NOTE — Progress Notes (Signed)
Office: [phone]  /  Fax: [phone]   Dear Dr. Quay Burow,   Thank you for referring [name] [name] to our clinic. The following note includes my evaluation and treatment recommendations.  HPI:   Chief Complaint: OBESITY  [name] [name] has been referred by [name] Rail, MD for consultation regarding her obesity and obesity related comorbidities.  [name] [name] (MR# [id number]) is a 56 y.o. female who presents on 02/21/2017 for obesity evaluation and treatment. Current BMI is Body mass index is 36.28 kg/m.[name] [name] [name] has struggled with obesity for years and has been unsuccessful in either losing weight or maintaining long term weight loss. [name] [name] attended our information session and states she is currently in the action stage of change and ready to dedicate time achieving and maintaining a healthier weight.  [name] [name] states she thinks her family will eat healthier with  her her desired weight loss is 46 lbs she has been heavy most of  her life she started gaining weight after childbirth her heaviest weight ever was 223 lbs. she has significant food cravings issues  she snacks frequently in the evenings she is frequently drinking liquids with calories she frequently makes poor food choices she frequently eats larger portions than normal  she has binge eating behaviors she struggles with emotional eating    Fatigue [name] [name] feels her energy is lower than it should be. This has worsened with weight gain and has not worsened recently. [name] [name] denies daytime somnolence and  denies waking up still tired. Patient is at risk for obstructive sleep apnea. Patent has a history of symptoms of morning fatigue. Patient generally gets 5 hours of sleep per night, and states they generally have restless sleep. Snoring is not present. Apneic episodes are not present. Epworth Sleepiness Score is 1  Dyspnea on exertion [name] [name] notes increasing shortness of breath with exercising  and seems to be worsening over time with weight gain. She notes getting out of breath sooner with activity than she used to. This has not gotten worse recently. [name] [name] denies orthopnea.  Hypertension [name] [name] is a 56 y.o. female with hypertension. Her blood pressure is controlled today on medications. [name] [name] denies chest pain or headache. She is working weight loss to help control her blood pressure with the goal of decreasing her risk of heart attack and stroke.   Multiple Sclerosis Sharice states MS is under control, has a mild flare up approximately 1 time per year, requiring a medrol dose pak but none this year.  At risk for diabetes [name] [name] is at higher than average risk for developing diabetes due to her obesity. She currently denies polyuria or polydipsia.  Depression Screen [name] [name] Food and Mood (modified PHQ-9) score was  Depression screen PHQ 2/9 02/21/2017  Decreased Interest 2  Down, Depressed, Hopeless 2  PHQ - 2 Score 4  Altered sleeping 3  Tired, decreased energy 1  Change in appetite 1  Feeling bad or failure about yourself  0  Trouble concentrating 0  Moving slowly or fidgety/restless 0  Suicidal thoughts 0  PHQ-9 Score 9  Some recent data might be hidden    ALLERGIES: Allergies  Allergen Reactions  . Ace Inhibitors Anaphylaxis     Angioedema (tongue swelling)  . Clarithromycin Anaphylaxis    Throat swells  . Levaquin [Levofloxacin] Other (See Comments)    Tendon pain - shoulder and calf    MEDICATIONS: Current Outpatient Prescriptions on File Prior to Visit  Medication Sig Dispense Refill  . acetaminophen (TYLENOL) 500 MG  tablet Take 1,000 mg by mouth 2 (two) times daily as needed (pain). Reported on 09/23/2015    . ALPRAZolam (XANAX) 0.5 MG tablet Take 1 tablet (0.5 mg total) by mouth 2 (two) times daily as needed for anxiety or sleep. 90 tablet 0  . b complex vitamins tablet Take 1 tablet by mouth daily.    . Clobetasol  Prop Emollient Base (CLOBETASOL PROPIONATE E) 0.05 % emollient cream Apply 1 application topically 2 (two) times daily. 30 g 0  . COPAXONE 40 MG/ML SOSY INJECT ONE SYRINGE (40 MG) SUBCUTANEOUSLY THREE TIMES PER WEEK AT LEAST 48 HOURS APART. ALLOW SYRINGE TO WARM TO ROOM TEMPERATURE FOR 20 MIN 36 Syringe 3  . Diclofenac Sodium 2 % SOLN Place 2 g onto the skin 2 (two) times daily. 112 g 3  . ferrous gluconate (FERGON) 324 MG tablet Take 1 tablet (324 mg total) by mouth 2 (two) times daily with a meal. 180 tablet 3  . fluticasone (FLONASE) 50 MCG/ACT nasal spray USE 2 SPRAYS IN EACH       NOSTRIL DAILY 16 g 6  . gabapentin (NEURONTIN) 100 MG capsule 200 mg in morning and afternoon, 300 mg at night 630 capsule 1  . hydrochlorothiazide (HYDRODIURIL) 25 MG tablet TAKE 1 TABLET DAILY 90 tablet 2  . ibuprofen (ADVIL,MOTRIN) 800 MG tablet Take 1 by mouth 4-6 hours as needed    . KLOR-CON SPRINKLE 10 MEQ CR capsule TAKE 2 CAPSULES (20MEQ     TOTAL) TWICE DAILY 180 capsule 1  . loratadine-pseudoephedrine (CLARITIN-D 12 HOUR) 5-120 MG tablet Take 1 tablet by mouth daily as needed (seasonal allergies). 30 tablet 3  . losartan (COZAAR) 50 MG tablet Take 1 tablet (50 mg total) by mouth 2 (two) times daily. 180 tablet 1  . metoprolol tartrate (LOPRESSOR) 25 MG tablet Take 1 tablet (25 mg total) by mouth 2 (two) times daily. 180 tablet 3  . pantoprazole (PROTONIX) 40 MG tablet TAKE 1 TABLET DAILY 90 tablet 3  . polyethylene glycol (MIRALAX / GLYCOLAX) packet Take 17 g by mouth daily. 30 each 11  . potassium chloride (MICRO-K) 10 MEQ CR capsule TAKE 2 CAPSULES (20MEQ     TOTAL) TWICE DAILY 180 capsule 1  . Probiotic Product [facility]) CAPS Take 1 capsule by mouth daily. 30 capsule 11  . valACYclovir (VALTREX) 1000 MG tablet Take 1 tablet (1,000 mg total) by mouth 3 (three) times daily. 21 tablet 0  . Vitamin D, Ergocalciferol, (DRISDOL) 50000 units CAPS capsule Take 1 capsule (50,000 Units total) by mouth every 7  (seven) days. 12 capsule 0  . amoxicillin-clavulanate (AUGMENTIN) 875-125 MG tablet Take 1 tablet by mouth 2 (two) times daily. (Patient not taking: Reported on 02/21/2017) 20 tablet 0  . cyclobenzaprine (FLEXERIL) 5 MG tablet Take 1 by mouth daily as needed    . HYDROcodone-homatropine (HYCODAN) 5-1.5 MG/5ML syrup Take 5 mLs by mouth every 8 (eight) hours as needed for cough. (Patient not taking: Reported on 02/21/2017) 120 mL 0  . [DISCONTINUED] mometasone (NASONEX) 50 MCG/ACT nasal spray Place 2 sprays into the nose daily. 17 g 2   No current facility-administered medications on file prior to visit.     PAST MEDICAL HISTORY: Past Medical History:  Diagnosis Date  . Allergic rhinitis due to other allergen   . Allergy   . Angioedema    ACEI  . Anxiety   . Breast cancer of upper-outer quadrant of right female breast ([place]) 10/23/2014   s/p r  mastectomy, neoadj chemo completed 04/21/15; neg genetic testing  . Colon polyps 05/2014   adenomatous, colo q 5y  . CONSTIPATION, CHRONIC   . GERD   . Hot flashes   . HYPERLIPIDEMIA   . HYPERTENSION   . Lactose intolerance   . Migraine headache   . Multiple sclerosis ([place])   . OBESITY, TRUNCAL     PAST SURGICAL HISTORY: Past Surgical History:  Procedure Laterality Date  . BREAST BIOPSY Right    [place]   . COLONOSCOPY    . MASTECTOMY Right   . PORT-A-CATH REMOVAL Left 05/13/2015   Procedure: REMOVAL PORT-A-CATH;  Surgeon: [name], MD;  Location: [place];  Service: General;  Laterality: Left;  . PORTACATH PLACEMENT Left 11/06/2014   Procedure: INSERTION PORT-A-CATH;  Surgeon: [name], MD;  Location: [place];  Service: General;  Laterality: Left;  . SIMPLE MASTECTOMY WITH AXILLARY SENTINEL NODE BIOPSY Right 05/13/2015   Procedure: RIGHT TOTAL  MASTECTOMY WITH RIGHT  AXILLARY SENTINEL NODE BIOPSY;  Surgeon: [name], MD;  Location: [place];  Service: General;  Laterality: Right;  . TUBAL LIGATION      SOCIAL  HISTORY: Social History  Substance Use Topics  . Smoking status: Never Smoker  . Smokeless tobacco: Never Used  . Alcohol use No    FAMILY HISTORY: Family History  Problem Relation Age of Onset  . Coronary artery disease Mother   . Heart disease Mother   . Diabetes Mother   . Hypertension Mother   . Stroke Mother   . Alcohol abuse Father   . COPD Maternal Grandmother   . Hypertension Other   . Hyperlipidemia Other   . Diabetes Other   . Colon cancer Neg Hx     ROS: Review of Systems  Constitutional: Positive for malaise/fatigue.  HENT: Positive for sinus pain.   Eyes:       Wear Glasses or Contacts (reading)  Cardiovascular: Negative for chest pain and orthopnea.  Gastrointestinal: Positive for constipation.  Genitourinary: Negative for frequency.  Neurological: Negative for headaches.  Endo/Heme/Allergies: Negative for polydipsia.  Psychiatric/Behavioral: The patient has insomnia.     PHYSICAL EXAM: Blood pressure 124/82, pulse 95, temperature 97.9 F (36.6 C), temperature source Oral, height 5\' 5"  (1.651 m), weight 218 lb (98.9 kg), last menstrual period 11/01/2012, SpO2 99 %. Body mass index is 36.28 kg/m. Physical Exam  Constitutional: She is oriented to person, place, and time. She appears well-developed and well-nourished.  Cardiovascular: Normal rate.   Pulmonary/Chest: Effort normal.  Abdominal: Soft.  Musculoskeletal: Normal range of motion.  Neurological: She is oriented to person, place, and time.  Skin: Skin is warm and dry.  Psychiatric: She has a normal mood and affect. Her behavior is normal.  Vitals reviewed.   RECENT LABS AND TESTS: BMET    Component Value Date/Time   NA 140 11/06/2016 1503   K 3.6 11/06/2016 1503   CL 103 05/30/2016 1619   CO2 25 11/06/2016 1503   GLUCOSE 98 11/06/2016 1503   BUN 14.0 11/06/2016 1503   CREATININE 0.9 11/06/2016 1503   CALCIUM 9.7 11/06/2016 1503   GFRNONAA >60 05/14/2015 0327   GFRAA >60 05/14/2015  0327   Lab Results  Component Value Date   HGBA1C 5.9 12/30/2007   No results found for: INSULIN CBC    Component Value Date/Time   WBC 6.1 05/30/2016 1619   RBC 4.18 05/30/2016 1619   HGB 12.6 05/30/2016 1619   HGB 12.8 03/27/2016 1314  HCT 36.7 05/30/2016 1619   HCT 38.5 03/27/2016 1314   PLT 238.0 05/30/2016 1619   PLT 247 03/27/2016 1314   MCV 87.9 05/30/2016 1619   MCV 89.1 03/27/2016 1314   MCH 29.6 03/27/2016 1314   MCH 32.6 05/14/2015 0327   MCHC 34.3 05/30/2016 1619   RDW 14.9 05/30/2016 1619   RDW 15.8 (H) 03/27/2016 1314   LYMPHSABS 1.8 05/30/2016 1619   LYMPHSABS 2.4 03/27/2016 1314   MONOABS 0.4 05/30/2016 1619   MONOABS 0.6 03/27/2016 1314   EOSABS 0.3 05/30/2016 1619   EOSABS 0.3 03/27/2016 1314   BASOSABS 0.0 05/30/2016 1619   BASOSABS 0.0 03/27/2016 1314   Iron/TIBC/Ferritin/ %Sat No results found for: IRON, TIBC, FERRITIN, IRONPCTSAT Lipid Panel     Component Value Date/Time   CHOL 218 (H) 08/30/2015 1034   TRIG 208.0 (H) 08/30/2015 1034   HDL 53.60 08/30/2015 1034   CHOLHDL 4 08/30/2015 1034   VLDL 41.6 (H) 08/30/2015 1034   LDLCALC 109 (H) 01/14/2014 1206   LDLDIRECT 139.0 08/30/2015 1034   Hepatic Function Panel     Component Value Date/Time   PROT 7.5 11/06/2016 1503   ALBUMIN 4.0 11/06/2016 1503   AST 18 11/06/2016 1503   ALT 21 11/06/2016 1503   ALKPHOS 115 11/06/2016 1503   BILITOT 0.28 11/06/2016 1503   BILIDIR 0.1 08/30/2015 1034      Component Value Date/Time   TSH 1.31 08/30/2015 1034   TSH 1.86 01/14/2014 1206   TSH 1.88 10/22/2012 0859    ECG  shows NSR with a rate of 95 BPM INDIRECT CALORIMETER done today shows a VO2 of 229 and a REE of 1597.    ASSESSMENT AND PLAN: Other fatigue - Plan: EKG 12-Lead, Vitamin B12, CBC With Differential, Comprehensive metabolic panel, Folate, Hemoglobin A1c, Insulin, random, Lipid Panel With LDL/HDL Ratio, T3, T4, free, TSH, VITAMIN D 25 Hydroxy (Vit-D Deficiency,  Fractures)  Shortness of breath on exertion  Essential hypertension  Multiple sclerosis (HCC)  Depression screening  At risk for diabetes mellitus - Plan: Hemoglobin A1c, Insulin, random  Class 2 obesity without serious comorbidity with body mass index (BMI) of 36.0 to 36.9 in adult, unspecified obesity type  PLAN: Fatigue Salene was informed that her fatigue may be related to obesity, depression or many other causes. Labs will be ordered, and in the meanwhile Kristle has agreed to work on diet, exercise and weight loss to help with fatigue. Proper sleep hygiene was discussed including the need for 7-8 hours of quality sleep each night. A sleep study was not ordered based on symptoms and Epworth score.  Dyspnea on exertion Ether's shortness of breath appears to be obesity related and exercise induced. She has agreed to work on weight loss and gradually increase exercise to treat her exercise induced shortness of breath. If [name] [name] follows our instructions and loses weight without improvement of her shortness of breath, we will plan to refer to pulmonology. We will monitor this condition regularly. Hisae agrees to this plan.  Hypertension We discussed sodium restriction, working on healthy weight loss, and a regular exercise program as the means to achieve improved blood pressure control. Bernedette agreed with this plan and agreed to follow up as directed. We will continue to monitor her blood pressure as well as her progress with the above lifestyle modifications. She will continue her medications as prescribed and will watch for signs of hypotension as she continues her lifestyle modifications.  Multiple Sclerosis We will continue to monitor  and factor this in when it is time to start exercising. Addilynn agrees to follow up with our clinic in 2 weeks.  Diabetes risk counselling Arwen was given extended (at least 15 minutes) diabetes prevention counseling today. She  is 56 y.o. female and has risk factors for diabetes including obesity. We discussed intensive lifestyle modifications today with an emphasis on weight loss as well as increasing exercise and decreasing simple carbohydrates in her diet.  Depression Screen Marirose had a mildly positive depression screening. Depression is commonly associated with obesity and often results in emotional eating behaviors. We will monitor this closely and work on CBT to help improve the non-hunger eating patterns. Referral to Psychology may be required if no improvement is seen as she continues in our clinic.  Obesity Maybelline is currently in the action stage of change and her goal is to continue with weight loss efforts. I recommend Kaisa begin the structured treatment plan as follows:  She has agreed to portion control better and make smarter food choices, such as increase vegetables and decrease simple carbohydrates  and follow the Category 2 plan Ariahna has been instructed to eventually work up to a goal of 150 minutes of combined cardio and strengthening exercise per week for weight loss and overall health benefits. We discussed the following Behavioral Modification Stratagies today: decreasing simple carbohydrates , increasing lower sugar fruits and work on meal planning and easy cooking plans  Erienne has agreed to join our obesity program and follow up with our clinic in 2 weeks. She was informed of the importance of frequent follow up visits to maximize her success with intensive lifestyle modifications for her multiple health conditions. She was informed we would discuss her lab results at her next visit unless there is a critical issue that needs to be addressed sooner. Jeanice agreed to keep her next visit at the agreed upon time to discuss these results.  I, Doreene Nest, am acting as scribe for Dennard Nip, MD  I have reviewed the above documentation for accuracy and completeness, and I agree  with the above. -Dennard Nip, MD  OBESITY BEHAVIORAL INTERVENTION VISIT  Today's visit was # 1 out of 22.  Starting weight: 218 lbs Starting date: 02/21/17 Today's weight : 218 lbs  Today's date: 02/21/2017 Total lbs lost to date: 0 (Patients must lose 7 lbs in the first 6 months to continue with counseling)   ASK: We discussed the diagnosis of obesity with [name] today and Argie agreed to give [place] permission to discuss obesity behavioral modification therapy today.  ASSESS: Starkeisha has the diagnosis of obesity and her BMI today is 80.4 Kristyn is in the action stage of change   ADVISE: Reannon was educated on the multiple health risks of obesity as well as the benefit of weight loss to improve her health. She was advised of the need for long term treatment and the importance of lifestyle modifications.  AGREE: Multiple dietary modification options and treatment options were discussed and  Mykelti agreed to follow the Category 2 plan +100 calories We discussed the following Behavioral Modification Strategies today: increasing lean protein intake and decrease eating out

## 2017-02-22 ENCOUNTER — Other Ambulatory Visit: Payer: Self-pay | Admitting: *Deleted

## 2017-02-22 LAB — CBC WITH DIFFERENTIAL
BASOS: 0 %
Basophils Absolute: 0 10*3/uL (ref 0.0–0.2)
EOS (ABSOLUTE): 0.3 10*3/uL (ref 0.0–0.4)
Eos: 5 %
Hematocrit: 38.9 % (ref 34.0–46.6)
Hemoglobin: 13 g/dL (ref 11.1–15.9)
IMMATURE GRANS (ABS): 0 10*3/uL (ref 0.0–0.1)
Immature Granulocytes: 0 %
LYMPHS: 31 %
Lymphocytes Absolute: 1.8 10*3/uL (ref 0.7–3.1)
MCH: 29.1 pg (ref 26.6–33.0)
MCHC: 33.4 g/dL (ref 31.5–35.7)
MCV: 87 fL (ref 79–97)
Monocytes Absolute: 0.3 10*3/uL (ref 0.1–0.9)
Monocytes: 6 %
NEUTROS PCT: 58 %
Neutrophils Absolute: 3.4 10*3/uL (ref 1.4–7.0)
RBC: 4.47 x10E6/uL (ref 3.77–5.28)
RDW: 16.3 % — ABNORMAL HIGH (ref 12.3–15.4)
WBC: 5.8 10*3/uL (ref 3.4–10.8)

## 2017-02-22 LAB — TSH: TSH: 1.75 u[IU]/mL (ref 0.450–4.500)

## 2017-02-22 LAB — COMPREHENSIVE METABOLIC PANEL
A/G RATIO: 1.7 (ref 1.2–2.2)
ALT: 15 IU/L (ref 0–32)
AST: 21 IU/L (ref 0–40)
Albumin: 4.7 g/dL (ref 3.5–5.5)
Alkaline Phosphatase: 97 IU/L (ref 39–117)
BUN/Creatinine Ratio: 14 (ref 9–23)
BUN: 11 mg/dL (ref 6–24)
Bilirubin Total: 0.5 mg/dL (ref 0.0–1.2)
CALCIUM: 9.8 mg/dL (ref 8.7–10.2)
CO2: 23 mmol/L (ref 18–29)
CREATININE: 0.76 mg/dL (ref 0.57–1.00)
Chloride: 102 mmol/L (ref 96–106)
GFR, EST AFRICAN AMERICAN: 103 mL/min/{1.73_m2} (ref >59)
GFR, EST NON AFRICAN AMERICAN: 89 mL/min/{1.73_m2} (ref >59)
GLUCOSE: 92 mg/dL (ref 65–99)
Globulin, Total: 2.8 g/dL (ref 1.5–4.5)
Potassium: 3.7 mmol/L (ref 3.5–5.2)
Sodium: 142 mmol/L (ref 134–144)
TOTAL PROTEIN: 7.5 g/dL (ref 6.0–8.5)

## 2017-02-22 LAB — T4, FREE: FREE T4: 1.41 ng/dL (ref 0.82–1.77)

## 2017-02-22 LAB — HEMOGLOBIN A1C
Est. average glucose Bld gHb Est-mCnc: 108 mg/dL
Hgb A1c MFr Bld: 5.4 % (ref 4.8–5.6)

## 2017-02-22 LAB — LIPID PANEL WITH LDL/HDL RATIO
CHOLESTEROL TOTAL: 204 mg/dL — AB (ref 100–199)
HDL: 47 mg/dL (ref >39)
LDL CALC: 121 mg/dL — AB (ref 0–99)
LDl/HDL Ratio: 2.6 ratio (ref 0.0–3.2)
TRIGLYCERIDES: 182 mg/dL — AB (ref 0–149)
VLDL CHOLESTEROL CAL: 36 mg/dL (ref 5–40)

## 2017-02-22 LAB — FOLATE

## 2017-02-22 LAB — VITAMIN D 25 HYDROXY (VIT D DEFICIENCY, FRACTURES): Vit D, 25-Hydroxy: 39.2 ng/mL (ref 30.0–100.0)

## 2017-02-22 LAB — T3: T3, Total: 136 ng/dL (ref 71–180)

## 2017-02-22 LAB — INSULIN, RANDOM: INSULIN: 18.1 u[IU]/mL (ref 2.6–24.9)

## 2017-02-22 LAB — VITAMIN B12: Vitamin B-12: 940 pg/mL (ref 232–1245)

## 2017-02-22 MED ORDER — PANTOPRAZOLE SODIUM 40 MG PO TBEC: 40.0000 mg | DELAYED_RELEASE_TABLET | Freq: Every day | ORAL | 2 refills | Status: DC

## 2017-02-22 NOTE — Telephone Encounter (Signed)
Rec'd call pt states she is needing rx sent to [facility] on her pantoprazole. Verified chart sent rx electronically...[name] [name]

## 2017-03-07 ENCOUNTER — Ambulatory Visit (INDEPENDENT_AMBULATORY_CARE_PROVIDER_SITE_OTHER): Payer: BC Managed Care – PPO | Admitting: Family Medicine

## 2017-03-08 ENCOUNTER — Telehealth (INDEPENDENT_AMBULATORY_CARE_PROVIDER_SITE_OTHER): Payer: Self-pay | Admitting: Family Medicine

## 2017-03-08 NOTE — Telephone Encounter (Signed)
Please have pt see the front desk for [employer]-Funds.  Thank you,  [facility]

## 2017-03-12 ENCOUNTER — Other Ambulatory Visit: Payer: Self-pay | Admitting: Internal Medicine

## 2017-03-12 ENCOUNTER — Telehealth: Payer: Self-pay | Admitting: Internal Medicine

## 2017-03-12 NOTE — Telephone Encounter (Signed)
Her last blood work looked ok.  Oncology might be able to tell her if the fatigue is a result of chemo.    Does she snore - we may want to rule out sleep apnea especially if she is getting a good amount of sleep and not being refreshed.

## 2017-03-12 NOTE — Telephone Encounter (Signed)
Pt called in and said that she is really tired and is wondering if it is a side effect of the chemo.  She said that she is super tired but when she lays down she can go to sleep.

## 2017-03-12 NOTE — Telephone Encounter (Signed)
LVM for pt to call back and discuss.  

## 2017-03-13 ENCOUNTER — Ambulatory Visit (INDEPENDENT_AMBULATORY_CARE_PROVIDER_SITE_OTHER): Payer: BC Managed Care – PPO

## 2017-03-13 DIAGNOSIS — Z23 Encounter for immunization: Secondary | ICD-10-CM | POA: Diagnosis not present

## 2017-03-13 NOTE — Telephone Encounter (Signed)
Pt came in and I told her what Dr Quay Burow said, she said that she has had a sleep [place] test and she doesn't have that so she will call her oncologist and see what they say

## 2017-03-13 NOTE — Progress Notes (Signed)
Injection given.   Hibo Blasdell J Emri Sample, MD  

## 2017-03-14 ENCOUNTER — Ambulatory Visit (INDEPENDENT_AMBULATORY_CARE_PROVIDER_SITE_OTHER): Payer: BC Managed Care – PPO | Admitting: Family Medicine

## 2017-03-14 VITALS — BP 114/75 | HR 85 | Temp 98.5°F | Ht 65.0 in | Wt 217.0 lb

## 2017-03-14 DIAGNOSIS — E782 Mixed hyperlipidemia: Secondary | ICD-10-CM | POA: Diagnosis not present

## 2017-03-14 DIAGNOSIS — E8881 Metabolic syndrome: Secondary | ICD-10-CM | POA: Diagnosis not present

## 2017-03-14 DIAGNOSIS — Z6836 Body mass index (BMI) 36.0-36.9, adult: Secondary | ICD-10-CM

## 2017-03-14 DIAGNOSIS — E669 Obesity, unspecified: Secondary | ICD-10-CM

## 2017-03-14 DIAGNOSIS — Z9189 Other specified personal risk factors, not elsewhere classified: Secondary | ICD-10-CM

## 2017-03-14 DIAGNOSIS — E559 Vitamin D deficiency, unspecified: Secondary | ICD-10-CM

## 2017-03-14 MED ORDER — VITAMIN D (ERGOCALCIFEROL) 1.25 MG (50000 UNIT) PO CAPS: 50000.0000 [IU] | ORAL_CAPSULE | ORAL | 0 refills | Status: DC

## 2017-03-14 MED ORDER — METFORMIN HCL 500 MG PO TABS: 500.0000 mg | ORAL_TABLET | Freq: Every day | ORAL | 0 refills | Status: DC

## 2017-03-14 NOTE — Progress Notes (Signed)
Office: [phone]  /  Fax: [phone]   HPI:   Chief Complaint: OBESITY [name] [name] is here to discuss her progress with her obesity treatment plan. She is on the  follow the Category 2 plan and is following her eating plan approximately 100 % of the time. She states she is exercising 0 minutes 0 times per week. Folasade liked the category 2 plan and liked the variety but didn't like the bread. She struggled with hunger mid-morning and had to snack more often. Her weight is 217 lb (98.4 kg) today and has had a weight loss of 1 pound over a period of 3 weeks since her last visit. She has lost 1 lb since starting treatment with [place].  Vitamin D deficiency [name] [name] has a diagnosis of vitamin D deficiency. She is currently taking OTC vit D 1,000 units qd, she is not yet at goal. She admits fatigue and denies nausea, vomiting or muscle weakness.  At risk for osteopenia [name] [name] is at higher risk of osteopenia and osteoporosis due to vitamin D deficiency.   Insulin Resistance [name] [name] has a new diagnosis of insulin resistance based on her elevated fasting insulin level >5. Although Catia's blood glucose readings are still under good control, insulin resistance puts her at greater risk of metabolic syndrome and diabetes. She has positive family history of diabetes and she notes polyphagia. She is not taking metformin currently and continues to work on diet and exercise to decrease risk of diabetes.  Mixed Hyperlipidemia [name] [name] has hyperlipidemia and has been trying to improve her cholesterol levels with intensive lifestyle modification including a low saturated fat diet, exercise and weight loss. She denies any chest pain, claudication or myalgias.  ALLERGIES: Allergies  Allergen Reactions  . Ace Inhibitors Anaphylaxis     Angioedema (tongue swelling)  . Clarithromycin Anaphylaxis    Throat swells  . Levaquin [Levofloxacin] Other (See Comments)    Tendon pain - shoulder and  calf    MEDICATIONS: Current Outpatient Prescriptions on File Prior to Visit  Medication Sig Dispense Refill  . acetaminophen (TYLENOL) 500 MG tablet Take 1,000 mg by mouth 2 (two) times daily as needed (pain). Reported on 09/23/2015    . ALPRAZolam (XANAX) 0.5 MG tablet Take 1 tablet (0.5 mg total) by mouth 2 (two) times daily as needed for anxiety or sleep. 90 tablet 0  . amoxicillin-clavulanate (AUGMENTIN) 875-125 MG tablet Take 1 tablet by mouth 2 (two) times daily. (Patient not taking: Reported on 02/21/2017) 20 tablet 0  . b complex vitamins tablet Take 1 tablet by mouth daily.    . Clobetasol Prop Emollient Base (CLOBETASOL PROPIONATE E) 0.05 % emollient cream Apply 1 application topically 2 (two) times daily. 30 g 0  . COPAXONE 40 MG/ML SOSY INJECT ONE SYRINGE (40 MG) SUBCUTANEOUSLY THREE TIMES PER WEEK AT LEAST 48 HOURS APART. ALLOW SYRINGE TO WARM TO ROOM TEMPERATURE FOR 20 MIN 36 Syringe 3  . cyclobenzaprine (FLEXERIL) 5 MG tablet Take 1 by mouth daily as needed    . Diclofenac Sodium 2 % SOLN Place 2 g onto the skin 2 (two) times daily. 112 g 3  . ferrous gluconate (FERGON) 324 MG tablet Take 1 tablet (324 mg total) by mouth 2 (two) times daily with a meal. 180 tablet 3  . fluticasone (FLONASE) 50 MCG/ACT nasal spray USE 2 SPRAYS IN EACH       NOSTRIL DAILY 16 g 6  . gabapentin (NEURONTIN) 100 MG capsule 200 mg in morning and afternoon, 300  mg at night 630 capsule 1  . hydrochlorothiazide (HYDRODIURIL) 25 MG tablet TAKE 1 TABLET DAILY 90 tablet 0  . HYDROcodone-homatropine (HYCODAN) 5-1.5 MG/5ML syrup Take 5 mLs by mouth every 8 (eight) hours as needed for cough. (Patient not taking: Reported on 02/21/2017) 120 mL 0  . ibuprofen (ADVIL,MOTRIN) 800 MG tablet Take 1 by mouth 4-6 hours as needed    . KLOR-CON SPRINKLE 10 MEQ CR capsule TAKE 2 CAPSULES (20MEQ     TOTAL) TWICE DAILY 180 capsule 1  . loratadine-pseudoephedrine (CLARITIN-D 12 HOUR) 5-120 MG tablet Take 1 tablet by mouth daily as  needed (seasonal allergies). 30 tablet 3  . losartan (COZAAR) 50 MG tablet TAKE 1 TABLET TWICE A DAY 180 tablet 0  . metoprolol tartrate (LOPRESSOR) 25 MG tablet Take 1 tablet (25 mg total) by mouth 2 (two) times daily. 180 tablet 3  . pantoprazole (PROTONIX) 40 MG tablet Take 1 tablet (40 mg total) by mouth daily. 90 tablet 2  . polyethylene glycol (MIRALAX / GLYCOLAX) packet Take 17 g by mouth daily. 30 each 11  . potassium chloride (MICRO-K) 10 MEQ CR capsule TAKE 2 CAPSULES (20MEQ     TOTAL) TWICE DAILY 180 capsule 1  . Probiotic Product [facility]) CAPS Take 1 capsule by mouth daily. 30 capsule 11  . valACYclovir (VALTREX) 1000 MG tablet Take 1 tablet (1,000 mg total) by mouth 3 (three) times daily. 21 tablet 0  . [DISCONTINUED] mometasone (NASONEX) 50 MCG/ACT nasal spray Place 2 sprays into the nose daily. 17 g 2   No current facility-administered medications on file prior to visit.     PAST MEDICAL HISTORY: Past Medical History:  Diagnosis Date  . Allergic rhinitis due to other allergen   . Allergy   . Angioedema    ACEI  . Anxiety   . Breast cancer of upper-outer quadrant of right female breast ([place]) 10/23/2014   s/p r mastectomy, neoadj chemo completed 04/21/15; neg genetic testing  . Colon polyps 05/2014   adenomatous, colo q 5y  . CONSTIPATION, CHRONIC   . GERD   . Hot flashes   . HYPERLIPIDEMIA   . HYPERTENSION   . Lactose intolerance   . Migraine headache   . Multiple sclerosis ([place])   . OBESITY, TRUNCAL     PAST SURGICAL HISTORY: Past Surgical History:  Procedure Laterality Date  . BREAST BIOPSY Right    [place]   . COLONOSCOPY    . MASTECTOMY Right   . PORT-A-CATH REMOVAL Left 05/13/2015   Procedure: REMOVAL PORT-A-CATH;  Surgeon: [name], MD;  Location: [place];  Service: General;  Laterality: Left;  . PORTACATH PLACEMENT Left 11/06/2014   Procedure: INSERTION PORT-A-CATH;  Surgeon: [name], MD;  Location: [place];  Service:  General;  Laterality: Left;  . SIMPLE MASTECTOMY WITH AXILLARY SENTINEL NODE BIOPSY Right 05/13/2015   Procedure: RIGHT TOTAL  MASTECTOMY WITH RIGHT  AXILLARY SENTINEL NODE BIOPSY;  Surgeon: [name], MD;  Location: [place];  Service: General;  Laterality: Right;  . TUBAL LIGATION      SOCIAL HISTORY: Social History  Substance Use Topics  . Smoking status: Never Smoker  . Smokeless tobacco: Never Used  . Alcohol use No    FAMILY HISTORY: Family History  Problem Relation Age of Onset  . Coronary artery disease Mother   . Heart disease Mother   . Diabetes Mother   . Hypertension Mother   . Stroke Mother   . Alcohol abuse Father   .  COPD Maternal Grandmother   . Hypertension Other   . Hyperlipidemia Other   . Diabetes Other   . Colon cancer Neg Hx     ROS: Review of Systems  Constitutional: Positive for malaise/fatigue and weight loss.  Cardiovascular: Negative for chest pain and claudication.  Gastrointestinal: Negative for nausea and vomiting.  Musculoskeletal: Negative for myalgias.       Negative muscle weakness  Endo/Heme/Allergies:       Polyphagia    PHYSICAL EXAM: Blood pressure 114/75, pulse 85, temperature 98.5 F (36.9 C), temperature source Oral, height 5\' 5"  (1.651 m), weight 217 lb (98.4 kg), last menstrual period 11/01/2012, SpO2 99 %. Body mass index is 36.11 kg/m. Physical Exam  Constitutional: She is oriented to person, place, and time. She appears well-developed and well-nourished.  Cardiovascular: Normal rate.   Pulmonary/Chest: Effort normal.  Musculoskeletal: Normal range of motion.  Neurological: She is oriented to person, place, and time.  Skin: Skin is warm and dry.  Psychiatric: She has a normal mood and affect. Her behavior is normal.  Vitals reviewed.   RECENT LABS AND TESTS: BMET    Component Value Date/Time   NA 142 02/21/2017 1027   NA 140 11/06/2016 1503   K 3.7 02/21/2017 1027   K 3.6 11/06/2016 1503   CL 102  02/21/2017 1027   CO2 23 02/21/2017 1027   CO2 25 11/06/2016 1503   GLUCOSE 92 02/21/2017 1027   GLUCOSE 98 11/06/2016 1503   BUN 11 02/21/2017 1027   BUN 14.0 11/06/2016 1503   CREATININE 0.76 02/21/2017 1027   CREATININE 0.9 11/06/2016 1503   CALCIUM 9.8 02/21/2017 1027   CALCIUM 9.7 11/06/2016 1503   GFRNONAA 89 02/21/2017 1027   GFRAA 103 02/21/2017 1027   Lab Results  Component Value Date   HGBA1C 5.4 02/21/2017   HGBA1C 5.9 12/30/2007   Lab Results  Component Value Date   INSULIN 18.1 02/21/2017   CBC    Component Value Date/Time   WBC 5.8 02/21/2017 1027   WBC 6.1 05/30/2016 1619   RBC 4.47 02/21/2017 1027   RBC 4.18 05/30/2016 1619   HGB 13.0 02/21/2017 1027   HGB 12.8 03/27/2016 1314   HCT 38.9 02/21/2017 1027   HCT 38.5 03/27/2016 1314   PLT 238.0 05/30/2016 1619   PLT 247 03/27/2016 1314   MCV 87 02/21/2017 1027   MCV 89.1 03/27/2016 1314   MCH 29.1 02/21/2017 1027   MCH 29.6 03/27/2016 1314   MCH 32.6 05/14/2015 0327   MCHC 33.4 02/21/2017 1027   MCHC 34.3 05/30/2016 1619   RDW 16.3 (H) 02/21/2017 1027   RDW 15.8 (H) 03/27/2016 1314   LYMPHSABS 1.8 02/21/2017 1027   LYMPHSABS 2.4 03/27/2016 1314   MONOABS 0.4 05/30/2016 1619   MONOABS 0.6 03/27/2016 1314   EOSABS 0.3 02/21/2017 1027   BASOSABS 0.0 02/21/2017 1027   BASOSABS 0.0 03/27/2016 1314   Iron/TIBC/Ferritin/ %Sat No results found for: IRON, TIBC, FERRITIN, IRONPCTSAT Lipid Panel     Component Value Date/Time   CHOL 204 (H) 02/21/2017 1027   TRIG 182 (H) 02/21/2017 1027   HDL 47 02/21/2017 1027   CHOLHDL 4 08/30/2015 1034   VLDL 41.6 (H) 08/30/2015 1034   LDLCALC 121 (H) 02/21/2017 1027   LDLDIRECT 139.0 08/30/2015 1034   Hepatic Function Panel     Component Value Date/Time   PROT 7.5 02/21/2017 1027   PROT 7.5 11/06/2016 1503   ALBUMIN 4.7 02/21/2017 1027   ALBUMIN 4.0  11/06/2016 1503   AST 21 02/21/2017 1027   AST 18 11/06/2016 1503   ALT 15 02/21/2017 1027   ALT 21  11/06/2016 1503   ALKPHOS 97 02/21/2017 1027   ALKPHOS 115 11/06/2016 1503   BILITOT 0.5 02/21/2017 1027   BILITOT 0.28 11/06/2016 1503   BILIDIR 0.1 08/30/2015 1034      Component Value Date/Time   TSH 1.750 02/21/2017 1027   TSH 1.31 08/30/2015 1034   TSH 1.86 01/14/2014 1206    ASSESSMENT AND PLAN: Mixed hyperlipidemia  Vitamin D deficiency - Plan: Vitamin D, Ergocalciferol, (DRISDOL) 50000 units CAPS capsule  Insulin resistance - Plan: metFORMIN (GLUCOPHAGE) 500 MG tablet  At risk for osteoporosis  Class 2 obesity without serious comorbidity with body mass index (BMI) of 36.0 to 36.9 in adult, unspecified obesity type  PLAN:  Vitamin D Deficiency [name] [name] was informed that low vitamin D levels contributes to fatigue and are associated with obesity, breast, and colon cancer. She agrees to continue OTC vitamin D and start to take prescription Vit D @50 ,000 IU every week #4 with no refills and will follow up for routine testing of vitamin D, at least 2-3 times per year. She was informed of the risk of over-replacement of vitamin D and agrees to not increase her dose unless he discusses this with [place] first. Avi agrees to follow up with our clinic in 2 to 3 weeks.  At risk for osteopenia [name] [name] is at risk for osteopenia and osteoporsis due to her vitamin D deficiency. She was encouraged to take her vitamin D and follow her higher calcium diet and increase strengthening exercise to help strengthen her bones and decrease her risk of osteopenia and osteoporosis.  Insulin Resistance [name] [name] will continue to work on weight loss, exercise, and decreasing simple carbohydrates in her diet to help decrease the risk of diabetes. We dicussed metformin including benefits and risks. She was informed that eating too many simple carbohydrates or too many calories at one sitting increases the likelihood of GI side effects. [name] [name] agrees to start metformin for now and prescription was  written today for metformin 500 mg every morning #30 with no refills. Azie agreed to follow up with [place] as directed to monitor her progress.  Mixed Hyperlipidemia [name] [name] was informed of the American Heart Association Guidelines emphasizing intensive lifestyle modifications as the first line treatment for hyperlipidemia. We discussed many lifestyle modifications today in depth, and [name] [name] will continue to work on decreasing saturated fats such as fatty red meat, butter and many fried foods. She will also increase vegetables and lean protein in her diet and continue to work on exercise and weight loss efforts. We will re-check labs in 3 months.  Obesity [name] [name] is currently in the action stage of change. As such, her goal is to continue with weight loss efforts She has agreed to follow the Category 2 plan [name] [name] has been instructed to work up to a goal of 150 minutes of combined cardio and strengthening exercise per week for weight loss and overall health benefits. We discussed the following Behavioral Modification Strategies today: better snacking choices, increasing lean protein intake and decreasing simple carbohydrates   [name] [name] has agreed to follow up with our clinic in 2 to 3 weeks. She was informed of the importance of frequent follow up visits to maximize her success with intensive lifestyle modifications for her multiple health conditions.  [name] [name], am acting as transcriptionist for Dennard Nip, MD  I have reviewed the above  documentation for accuracy and completeness, and I agree with the above. -Dennard Nip, MD  OBESITY BEHAVIORAL INTERVENTION VISIT  Today's visit was # 2 out of 22.  Starting weight: 218 lbs Starting date: 02/21/17 Today's weight : 217 lbs Today's date: 03/14/2017 Total lbs lost to date: 1 (Patients must lose 7 lbs in the first 6 months to continue with counseling)   ASK: We discussed the diagnosis of obesity with [name] [name]  today and [name] [name] agreed to give [place] permission to discuss obesity behavioral modification therapy today.  ASSESS: [name] [name] has the diagnosis of obesity and her BMI today is 36.2 [name] [name] is in the action stage of change   ADVISE: [name] [name] was educated on the multiple health risks of obesity as well as the benefit of weight loss to improve her health. She was advised of the need for long term treatment and the importance of lifestyle modifications.  AGREE: Multiple dietary modification options and treatment options were discussed and  [name] [name] agreed to follow the Category 2 plan We discussed the following Behavioral Modification Strategies today: better snacking choices, increasing lean protein intake and decreasing simple carbohydrates

## 2017-03-26 ENCOUNTER — Telehealth: Payer: Self-pay | Admitting: Emergency Medicine

## 2017-03-26 NOTE — Telephone Encounter (Signed)
Spoke with pt. Pt states she is having hot flashes that are keeping her up at night. Per Dr Quay Burow, she can try natural supplements. Pt agreed and will contact us if she chooses to have an RX prescribed.

## 2017-04-04 ENCOUNTER — Other Ambulatory Visit: Payer: Self-pay | Admitting: Internal Medicine

## 2017-04-05 ENCOUNTER — Ambulatory Visit (HOSPITAL_BASED_OUTPATIENT_CLINIC_OR_DEPARTMENT_OTHER): Payer: BC Managed Care – PPO | Admitting: Oncology

## 2017-04-05 ENCOUNTER — Telehealth: Payer: Self-pay

## 2017-04-05 ENCOUNTER — Other Ambulatory Visit (HOSPITAL_BASED_OUTPATIENT_CLINIC_OR_DEPARTMENT_OTHER): Payer: BC Managed Care – PPO

## 2017-04-05 ENCOUNTER — Ambulatory Visit (INDEPENDENT_AMBULATORY_CARE_PROVIDER_SITE_OTHER): Payer: BC Managed Care – PPO | Admitting: Family Medicine

## 2017-04-05 ENCOUNTER — Encounter (INDEPENDENT_AMBULATORY_CARE_PROVIDER_SITE_OTHER): Payer: Self-pay

## 2017-04-05 VITALS — BP 122/70 | HR 84 | Temp 98.0°F | Resp 20 | Ht 65.0 in | Wt 217.1 lb

## 2017-04-05 DIAGNOSIS — C50411 Malignant neoplasm of upper-outer quadrant of right female breast: Secondary | ICD-10-CM | POA: Diagnosis not present

## 2017-04-05 DIAGNOSIS — Z170 Estrogen receptor positive status [ER+]: Secondary | ICD-10-CM | POA: Diagnosis not present

## 2017-04-05 LAB — CBC WITH DIFFERENTIAL/PLATELET
BASO%: 0.2 % (ref 0.0–2.0)
BASOS ABS: 0 10*3/uL (ref 0.0–0.1)
EOS ABS: 0.3 10*3/uL (ref 0.0–0.5)
EOS%: 5.2 % (ref 0.0–7.0)
HCT: 37 % (ref 34.8–46.6)
HEMOGLOBIN: 12.5 g/dL (ref 11.6–15.9)
LYMPH%: 39 % (ref 14.0–49.7)
MCH: 29.5 pg (ref 25.1–34.0)
MCHC: 33.8 g/dL (ref 31.5–36.0)
MCV: 87.3 fL (ref 79.5–101.0)
MONO#: 0.3 10*3/uL (ref 0.1–0.9)
MONO%: 6.2 % (ref 0.0–14.0)
NEUT#: 2.5 10*3/uL (ref 1.5–6.5)
NEUT%: 49.4 % (ref 38.4–76.8)
PLATELETS: 225 10*3/uL (ref 145–400)
RBC: 4.24 10*6/uL (ref 3.70–5.45)
RDW: 14.5 % (ref 11.2–14.5)
WBC: 5 10*3/uL (ref 3.9–10.3)
lymph#: 1.9 10*3/uL (ref 0.9–3.3)

## 2017-04-05 LAB — COMPREHENSIVE METABOLIC PANEL
ALBUMIN: 4.2 g/dL (ref 3.5–5.0)
ALK PHOS: 101 U/L (ref 40–150)
ALT: 17 U/L (ref 0–55)
ANION GAP: 10 meq/L (ref 3–11)
AST: 17 U/L (ref 5–34)
BILIRUBIN TOTAL: 0.56 mg/dL (ref 0.20–1.20)
BUN: 18.6 mg/dL (ref 7.0–26.0)
CO2: 25 mEq/L (ref 22–29)
Calcium: 10.2 mg/dL (ref 8.4–10.4)
Chloride: 107 mEq/L (ref 98–109)
Creatinine: 1 mg/dL (ref 0.6–1.1)
EGFR: 76 mL/min/{1.73_m2} — AB (ref >90)
GLUCOSE: 89 mg/dL (ref 70–140)
POTASSIUM: 4.5 meq/L (ref 3.5–5.1)
SODIUM: 142 meq/L (ref 136–145)
Total Protein: 7.7 g/dL (ref 6.4–8.3)

## 2017-04-05 NOTE — Telephone Encounter (Signed)
In basket sent to [employer] regarding FMLA papers.

## 2017-04-05 NOTE — Progress Notes (Signed)
[facility]  [phone] [phone]     ID: [name] [name] DOB: 12/31/1960  MR#: [id number]  EAA#:[id number]  Patient Care Team: Pincus Sanes, MD as PCP - General (Internal Medicine) Ilda Mori, MD as Consulting Physician (Obstetrics and Gynecology) Hilarie Fredrickson, MD (Gastroenterology) Adalberto Cole., MD (Neurology) Emelia Loron, MD as Consulting Physician (General Surgery) Brandy Kabat, Valentino Hue, MD as Consulting Physician (Oncology) Lonie Peak, MD as Attending Physician (Radiation Oncology) Donnelly Angelica, RN as Registered Nurse Pershing Proud, RN as Registered Nurse Damita Lack, Marcy Salvo, NP as Nurse Practitioner (Nurse Practitioner) Judi Saa, DO as Attending Physician (Family Medicine) OTHER MD:  CHIEF COMPLAINT: Triple negative breast cancer  CURRENT TREATMENT: Observation  BREAST CANCER HISTORY: From the original intake note:  [name] [name] had bilateral screening mammography at the [facility] Center 11/21/2013. Breast density was category C. Exam was unremarkable. In January 2016 however the patient palpated a change in her right breast laterally. She contacted her gynecologist, Dr. Arlyce Dice, and he set her up for a unilateral right diagnostic mammography with right breast ultrasonography at [facility] 10/15/2014. There was a focal irregular opacity in the lateral right breast associated with pleomorphic calcifications. This was palpable on exam as a firm non-mobile mass. Ultrasound confirmed a hypoechoic mass in the right breast at the 9:00 position 5 cm from the nipple, measuring 2.9 cm. Next to this mass was a normal-sized intramammary lymph node measuring 4 mm. In the right axilla there were 3 contiguous level I lymph nodes the largest measuring 11 mm.  On 10/20/2014 the patient underwent right core needle biopsy of the 9:00 breast mass, showing ([id number] [id number]) and invasive ductal carcinoma, grade 3, triple negative, with  the HER-2/neu signals ratio of 1.26 and number per cell 2.15. The MIB-1 was 71%. Biopsy of the largest right axillary lymph node was negative. Dr. Manson Passey the mammographer who performed a biopsy describes this is concordant.  Left breast mammography 10/26/2014 was negative, and bilateral breast MRI the same day confirmed, in the right breast, and upper outer quadrant mass measuring 2.5 cm, with a second mass measuring 1.4 cm slightly anterior and superior to it. In addition there was a total area of approximately 6.5 cm of non-masslike enhancement extending throughout the upper outer quadrant of the right breast. There was a cortically thickened right axillary lymph node which had been biopsied. There was also a mildly cortically thickened right subpectoral lymph node. There were no internal mammary or left axillary lymph nodes in the left breast was unremarkable.  The patient's subsequent history is as detailed below.  INTERVAL HISTORY: [name] [name] returns today for follow-up of her triple negative breast cancer. The interval history is generally unremarkable. She continues to work full-time. She has joint plan at fitness and is going at least 3 times a week.   REVIEW OF SYSTEMS: [name] [name] gives me no symptoms suggestive of disease progression or recurrence. She does feel tired around lunchtime. She has problems with insomnia. It is hard for her to follow sleep and after she falls asleep she tends to wake up usually with hot flashes. She is also constipated. She has a bowel movement only about once a week. She takes magnesium citrate twice a month for this. Bowel movements can be hard and painful. Aside from these issues a detailed review of systems today was stable y  PAST MEDICAL HISTORY: Past Medical History:  Diagnosis Date  . Allergic rhinitis due to other allergen   .  Allergy   . Angioedema    ACEI  . Anxiety   . Breast cancer of upper-outer quadrant of right female breast ([place]) 10/23/2014   s/p r  mastectomy, neoadj chemo completed 04/21/15; neg genetic testing  . Colon polyps 05/2014   adenomatous, colo q 5y  . CONSTIPATION, CHRONIC   . GERD   . Hot flashes   . HYPERLIPIDEMIA   . HYPERTENSION   . Lactose intolerance   . Migraine headache   . Multiple sclerosis ([place])   . OBESITY, TRUNCAL     PAST SURGICAL HISTORY: Past Surgical History:  Procedure Laterality Date  . BREAST BIOPSY Right    [place]   . COLONOSCOPY    . MASTECTOMY Right   . PORT-A-CATH REMOVAL Left 05/13/2015   Procedure: REMOVAL PORT-A-CATH;  Surgeon: [name], MD;  Location: [place];  Service: General;  Laterality: Left;  . PORTACATH PLACEMENT Left 11/06/2014   Procedure: INSERTION PORT-A-CATH;  Surgeon: [name], MD;  Location: [place];  Service: General;  Laterality: Left;  . SIMPLE MASTECTOMY WITH AXILLARY SENTINEL NODE BIOPSY Right 05/13/2015   Procedure: RIGHT TOTAL  MASTECTOMY WITH RIGHT  AXILLARY SENTINEL NODE BIOPSY;  Surgeon: [name], MD;  Location: [place];  Service: General;  Laterality: Right;  . TUBAL LIGATION      FAMILY HISTORY Family History  Problem Relation Age of Onset  . Coronary artery disease Mother   . Heart disease Mother   . Diabetes Mother   . Hypertension Mother   . Stroke Mother   . Alcohol abuse Father   . COPD Maternal Grandmother   . Hypertension Other   . Hyperlipidemia Other   . Diabetes Other   . Colon cancer Neg Hx    the patient's father died at the age of 56 following his seizure. The patient's mother died from congestive 38 failure at the age of 28. The patient had 5 brothers and 2 sisters. One brother died from Escherichia coli infection, the other one had a history of seizure disorder. One sister died from causes unknown to the patient. There is no history of breast or ovarian cancer in the family.  GYNECOLOGIC HISTORY:  Patient's last menstrual period was 11/01/2012. Menarche age 32, first live birth age 80. The patient is GX  P2. She stopped having periods in May 2014. She did not take hormone replacement. She did take her control pills remotely for approximately 4 years with no complications.  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 Her daughter [name] [name] lives in [place] and works in Ambulance person for [employer]. Son [name] [name] is an Engineer, building services for [employer]. The patient has 1 grand son who graduated from high school June 2017    ADVANCED DIRECTIVES: Not in place   HEALTH MAINTENANCE: Social History  Substance Use Topics  . Smoking status: Never Smoker  . Smokeless tobacco: Never Used  . Alcohol use No     Colonoscopy: September 2015/[name]  PAP: February 2015  Bone density:  Lipid panel:  Allergies  Allergen Reactions  . Ace Inhibitors Anaphylaxis     Angioedema (tongue swelling)  . Clarithromycin Anaphylaxis    Throat swells  . Levaquin [Levofloxacin] Other (See Comments)    Tendon pain - shoulder and calf    Current Outpatient Prescriptions  Medication Sig Dispense Refill  . acetaminophen (TYLENOL) 500 MG  tablet Take 1,000 mg by mouth 2 (two) times daily as needed (pain). Reported on 09/23/2015    . ALPRAZolam (XANAX) 0.5 MG tablet Take 1 tablet (0.5 mg total) by mouth 2 (two) times daily as needed for anxiety or sleep. 90 tablet 0  . amoxicillin-clavulanate (AUGMENTIN) 875-125 MG tablet Take 1 tablet by mouth 2 (two) times daily. (Patient not taking: Reported on 02/21/2017) 20 tablet 0  . b complex vitamins tablet Take 1 tablet by mouth daily.    . Clobetasol Prop Emollient Base (CLOBETASOL PROPIONATE E) 0.05 % emollient cream Apply 1 application topically 2 (two) times daily. 30 g 0  . COPAXONE 40 MG/ML SOSY INJECT ONE SYRINGE (40 MG) SUBCUTANEOUSLY THREE TIMES PER WEEK AT LEAST 48 HOURS APART. ALLOW SYRINGE TO WARM TO ROOM TEMPERATURE FOR 20 MIN 36  Syringe 3  . cyclobenzaprine (FLEXERIL) 5 MG tablet Take 1 by mouth daily as needed    . Diclofenac Sodium 2 % SOLN Place 2 g onto the skin 2 (two) times daily. 112 g 3  . ferrous gluconate (FERGON) 324 MG tablet Take 1 tablet (324 mg total) by mouth 2 (two) times daily with a meal. 180 tablet 3  . fluticasone (FLONASE) 50 MCG/ACT nasal spray USE 2 SPRAYS IN EACH       NOSTRIL DAILY 16 g 6  . gabapentin (NEURONTIN) 100 MG capsule 200 mg in morning and afternoon, 300 mg at night 630 capsule 1  . hydrochlorothiazide (HYDRODIURIL) 25 MG tablet TAKE 1 TABLET DAILY 90 tablet 0  . HYDROcodone-homatropine (HYCODAN) 5-1.5 MG/5ML syrup Take 5 mLs by mouth every 8 (eight) hours as needed for cough. (Patient not taking: Reported on 02/21/2017) 120 mL 0  . ibuprofen (ADVIL,MOTRIN) 800 MG tablet Take 1 by mouth 4-6 hours as needed    . KLOR-CON SPRINKLE 10 MEQ CR capsule TAKE 2 CAPSULES (20MEQ     TOTAL) TWICE DAILY 180 capsule 1  . loratadine-pseudoephedrine (CLARITIN-D 12 HOUR) 5-120 MG tablet Take 1 tablet by mouth daily as needed (seasonal allergies). 30 tablet 3  . losartan (COZAAR) 50 MG tablet TAKE 1 TABLET TWICE A DAY 180 tablet 0  . metFORMIN (GLUCOPHAGE) 500 MG tablet Take 1 tablet (500 mg total) by mouth daily with breakfast. 30 tablet 0  . metoprolol tartrate (LOPRESSOR) 25 MG tablet Take 1 tablet (25 mg total) by mouth 2 (two) times daily. 180 tablet 3  . pantoprazole (PROTONIX) 40 MG tablet Take 1 tablet (40 mg total) by mouth daily. 90 tablet 2  . polyethylene glycol (MIRALAX / GLYCOLAX) packet Take 17 g by mouth daily. 30 each 11  . potassium chloride (MICRO-K) 10 MEQ CR capsule TAKE 2 CAPSULES (20MEQ     TOTAL) TWICE DAILY 180 capsule 1  . Probiotic Product [facility]) CAPS Take 1 capsule by mouth daily. 30 capsule 11  . valACYclovir (VALTREX) 1000 MG tablet Take 1 tablet (1,000 mg total) by mouth 3 (three) times daily. 21 tablet 0  . Vitamin D, Ergocalciferol, (DRISDOL) 50000 units CAPS capsule  Take 1 capsule (50,000 Units total) by mouth every 7 (seven) days. 4 capsule 0   No current facility-administered medications for this visit.     OBJECTIVE: Middle-aged African-American woman Who appears well  Vitals:   04/05/17 1055  BP: 122/70  [name]: 84  Resp: 20  Temp: 98 F (36.7 C)     Body mass index is 36.13 kg/m.    ECOG FS:1 - Symptomatic but completely ambulatory   Sclerae  unicteric, pupils round and equal Oropharynx clear and moist No cervical or supraclavicular adenopathy Lungs no rales or rhonchi Heart regular rate and rhythm Abd soft, nontender, positive bowel sounds MSK no focal spinal tenderness, no upper extremity lymphedema Neuro: nonfocal, well oriented, appropriate affect Breasts: The right breast is status post mastectomy. I do not palpate any chest wall changes or findings suggestive of recurrence. The left breast is benign. Both axillae are benign.  LAB RESULTS:  CMP     Component Value Date/Time   NA 142 02/21/2017 1027   NA 140 11/06/2016 1503   K 3.7 02/21/2017 1027   K 3.6 11/06/2016 1503   CL 102 02/21/2017 1027   CO2 23 02/21/2017 1027   CO2 25 11/06/2016 1503   GLUCOSE 92 02/21/2017 1027   GLUCOSE 98 11/06/2016 1503   BUN 11 02/21/2017 1027   BUN 14.0 11/06/2016 1503   CREATININE 0.76 02/21/2017 1027   CREATININE 0.9 11/06/2016 1503   CALCIUM 9.8 02/21/2017 1027   CALCIUM 9.7 11/06/2016 1503   PROT 7.5 02/21/2017 1027   PROT 7.5 11/06/2016 1503   ALBUMIN 4.7 02/21/2017 1027   ALBUMIN 4.0 11/06/2016 1503   AST 21 02/21/2017 1027   AST 18 11/06/2016 1503   ALT 15 02/21/2017 1027   ALT 21 11/06/2016 1503   ALKPHOS 97 02/21/2017 1027   ALKPHOS 115 11/06/2016 1503   BILITOT 0.5 02/21/2017 1027   BILITOT 0.28 11/06/2016 1503   GFRNONAA 89 02/21/2017 1027   GFRAA 103 02/21/2017 1027    INo results found for: SPEP, UPEP  Lab Results  Component Value Date   WBC 5.0 04/05/2017   NEUTROABS 2.5 04/05/2017   HGB 12.5 04/05/2017    HCT 37.0 04/05/2017   MCV 87.3 04/05/2017   PLT 225 04/05/2017      Chemistry      Component Value Date/Time   NA 142 02/21/2017 1027   NA 140 11/06/2016 1503   K 3.7 02/21/2017 1027   K 3.6 11/06/2016 1503   CL 102 02/21/2017 1027   CO2 23 02/21/2017 1027   CO2 25 11/06/2016 1503   BUN 11 02/21/2017 1027   BUN 14.0 11/06/2016 1503   CREATININE 0.76 02/21/2017 1027   CREATININE 0.9 11/06/2016 1503      Component Value Date/Time   CALCIUM 9.8 02/21/2017 1027   CALCIUM 9.7 11/06/2016 1503   ALKPHOS 97 02/21/2017 1027   ALKPHOS 115 11/06/2016 1503   AST 21 02/21/2017 1027   AST 18 11/06/2016 1503   ALT 15 02/21/2017 1027   ALT 21 11/06/2016 1503   BILITOT 0.5 02/21/2017 1027   BILITOT 0.28 11/06/2016 1503       No results found for: LABCA2  No components found for: LABCA125  No results for input(s): INR in the last 168 hours.  Urinalysis    Component Value Date/Time   COLORURINE YELLOW 05/30/2016 1619   APPEARANCEUR CLEAR 05/30/2016 1619   LABSPEC 1.010 05/30/2016 1619   PHURINE 6.0 05/30/2016 1619   GLUCOSEU NEGATIVE 05/30/2016 1619   HGBUR NEGATIVE 05/30/2016 1619   HGBUR negative 08/19/2010 [place] 05/30/2016 1619   KETONESUR NEGATIVE 05/30/2016 1619   UROBILINOGEN 0.2 05/30/2016 1619   NITRITE NEGATIVE 05/30/2016 1619   LEUKOCYTESUR NEGATIVE 05/30/2016 1619    STUDIES: Unilateral left screening mammography at the [facility] 10/30/2016 found a breast density to be category C. There was no evidence of malignancy.  ASSESSMENT: 56 y.o. 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   2) neoadjuvant chemotherapy consisting of cyclophosphamide and doxorubicin in dose dense fashion 4 given with Neulasta support, completed 01/06/2015, followed by weekly carboplatin and paclitaxel 12 completed 04/21/2015  (3) status post right mastectomy and sentinel lymph node sampling 05/13/2015 for a ypT1c ypN0 invasive ductal carcinoma, high-grade, with ample margins.  (4) adjuvant radiation not necessary  PLAN: [name] [name] is now 2 years out from definitive surgery for her breast cancer with no evidence of disease recurrence--this is very favorable.  We discussed the fact that for estrogen receptor negative breast cancers, if they're going to recur, they tend to recur early. The first 2 years are the most dangerous once and she is through the os.  We discussed her symptoms. For the constipation she is can it continue the MiraLAX daily, add Colace 2 or 4 tablets a day, and consider a suppository twice a week.  For the insomnia she is already on gabapentin 300 mg at bedtime. She is going to try to add Benadryl 25 mg and see if that works.  I don't have any solution for the tiredness except to note that sometimes people get sleepy when they take gabapentin and she is taking it during the day  I strongly affirm her participation and plan at fitness  She will see me again in one year. She knows to call for any problems that may develop before the next visit here.    [name], MD   04/05/2017 11:16 AM

## 2017-04-09 ENCOUNTER — Other Ambulatory Visit (INDEPENDENT_AMBULATORY_CARE_PROVIDER_SITE_OTHER): Payer: Self-pay | Admitting: Family Medicine

## 2017-04-09 DIAGNOSIS — E8881 Metabolic syndrome: Secondary | ICD-10-CM

## 2017-04-10 ENCOUNTER — Telehealth: Payer: Self-pay | Admitting: *Deleted

## 2017-04-10 NOTE — Telephone Encounter (Signed)
Rec'd call pt states MD gave her samples of some Linzess and wanting to know if her insurance will cover. Inform pt not sure off hand. Gave two options she can call her insurance to see if they would cover, and if not ask them the alternatives that they will cover. Pt states she will give me a call back she will contact insurance...[name] [name]

## 2017-04-19 NOTE — Progress Notes (Signed)
Subjective:    Patient ID: [name] [name], female    DOB: 07/01/1961, 56 y.o.   MRN: [id number]  HPI She is here for an acute visit.   LUQ: she gets intermittent cramping in there LUQ and stretching helps.  She denies fever, chills, changes in bowel or GERD.  She did not know if she should take something for it.    Memory difficulties:  She still has some memory difficulties at times.  We have discussed this in the past.  She knows the chemotherapy may have contributed.  She has a lot of stress.  She also does not sleep well.  She does not want to take anything for her sleep except something natural.  Melatonin and benadryl have not been effective.  She has tried sleep teas and they are not effective.   Left trigger finger  - her left index finger gets stuck sometimes and it hurts. she is able to open it.  It does not occur daily.    right leg numbness from hip to toe - her right leg is numb and is sore.  It started about one year ago and is intermittent.  The pain is tolerable.  She has a little bit of mid back pain.  She thought it may be related to her MS.    Hypertension: She is taking her medication daily. She is compliant with a low sodium diet.  She denies chest pain, palpitations, edema, shortness of breath and regular headaches. She is exercising regularly.    Obesity:  She is exercising regularly.  She is trying to watch what she eats.  She did not want to continue at the metabolic clinic.  She is interested in trying a weight loss medication.   Medications and allergies reviewed with patient and updated if appropriate.  Patient Active Problem List   Diagnosis Date Noted  . Herpes zoster without complication 06/10/3006  . Left hip pain 10/31/2016  . Pes planus 10/31/2016  . Posterior tibialis muscle dysfunction 10/31/2016  . Muscle strain 10/21/2016  . Depression 09/03/2016  . Memory difficulties 09/03/2016  . Breast pain, right 08/24/2016  . Chronic fatigue 06/08/2016   . Cough 04/25/2016  . Palpitations 04/04/2016  . 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 Malignant neoplasm of upper-outer quadrant of right breast in female, 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[place]) 11/03/2015  . S/P mastectomy 05/13/2015  . Chemotherapy-induced neuropathy ([place] [facility]) 04/21/2015  . 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  . 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 Genetic testing 12/11/2014  . Chemotherapy induced neutropenia ([place]) 11/26/2014  . Breast cancer of upper-outer quadrant of right female breast ([place]) 10/23/2014  . Anxiety 04/29/2012  . Allergic rhinitis 11/29/2009  . 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 CONSTIPATION, CHRONIC 08/17/2008  . Dyslipidemia 04/24/2008  . METABOLIC SYNDROME X 62/26/3335  . GERD 01/07/2008  . OBESITY, TRUNCAL 09/20/2007  . Essential hypertension 09/20/2007  . Multiple sclerosis ([place]) 09/18/2007    Current Outpatient Prescriptions on File Prior to Visit  Medication Sig Dispense Refill  . acetaminophen (TYLENOL) 500 MG tablet Take 1,000 mg by mouth 2 (two) times daily as needed (pain). Reported on 09/23/2015    . ALPRAZolam (XANAX) 0.5 MG tablet Take 1 tablet (0.5 mg total) by mouth 2 (two) times daily as needed for anxiety or sleep. 90 tablet 0  . b complex vitamins tablet Take 1 tablet by mouth daily.    . Clobetasol Prop Emollient Base (CLOBETASOL PROPIONATE E) 0.05 % emollient cream Apply 1 application topically 2 (  two) times daily. 30 g 0  . COPAXONE 40 MG/ML SOSY INJECT ONE SYRINGE (40 MG) SUBCUTANEOUSLY THREE TIMES PER WEEK AT LEAST 48 HOURS APART. ALLOW SYRINGE TO WARM TO ROOM TEMPERATURE FOR 20 MIN 36 Syringe 3  . cyclobenzaprine (FLEXERIL) 5 MG tablet Take 1 by mouth daily as needed    . Diclofenac Sodium 2 % SOLN Place 2 g onto the skin 2 (two) times daily. 112 g 3  . ferrous gluconate (FERGON) 324 MG tablet Take 1 tablet (324 mg total) by mouth 2 (two) times daily with a meal. 180 tablet 3  . fluticasone (FLONASE) 50 MCG/ACT nasal  spray USE 2 SPRAYS IN EACH       NOSTRIL DAILY 16 g 6  . gabapentin (NEURONTIN) 100 MG capsule 200 mg in morning and afternoon, 300 mg at night 630 capsule 1  . hydrochlorothiazide (HYDRODIURIL) 25 MG tablet TAKE 1 TABLET DAILY 90 tablet 0  . ibuprofen (ADVIL,MOTRIN) 800 MG tablet Take 1 by mouth 4-6 hours as needed    . KLOR-CON SPRINKLE 10 MEQ CR capsule TAKE 2 CAPSULES (20MEQ     TOTAL) TWICE DAILY 180 capsule 1  . loratadine-pseudoephedrine (CLARITIN-D 12 HOUR) 5-120 MG tablet Take 1 tablet by mouth daily as needed (seasonal allergies). 30 tablet 3  . losartan (COZAAR) 50 MG tablet TAKE 1 TABLET TWICE A DAY 180 tablet 0  . metoprolol tartrate (LOPRESSOR) 25 MG tablet Take 1 tablet (25 mg total) by mouth 2 (two) times daily. 180 tablet 3  . pantoprazole (PROTONIX) 40 MG tablet Take 1 tablet (40 mg total) by mouth daily. 90 tablet 2  . polyethylene glycol (MIRALAX / GLYCOLAX) packet Take 17 g by mouth daily. 30 each 11  . potassium chloride (MICRO-K) 10 MEQ CR capsule TAKE 2 CAPSULES (20MEQ     TOTAL) TWICE DAILY 180 capsule 1  . Probiotic Product [facility]) CAPS Take 1 capsule by mouth daily. 30 capsule 11  . valACYclovir (VALTREX) 1000 MG tablet Take 1 tablet (1,000 mg total) by mouth 3 (three) times daily. 21 tablet 0  . Vitamin D, Ergocalciferol, (DRISDOL) 50000 units CAPS capsule Take 1 capsule (50,000 Units total) by mouth every 7 (seven) days. 4 capsule 0  . [DISCONTINUED] mometasone (NASONEX) 50 MCG/ACT nasal spray Place 2 sprays into the nose daily. 17 g 2   No current facility-administered medications on file prior to visit.     Past Medical History:  Diagnosis Date  . Allergic rhinitis due to other allergen   . Allergy   . Angioedema    ACEI  . Anxiety   . Breast cancer of upper-outer quadrant of right female breast ([place]) 10/23/2014   s/p r mastectomy, neoadj chemo completed 04/21/15; neg genetic testing  . Colon polyps 05/2014   adenomatous, colo q 5y  . CONSTIPATION, CHRONIC    . GERD   . Hot flashes   . HYPERLIPIDEMIA   . HYPERTENSION   . Lactose intolerance   . Migraine headache   . Multiple sclerosis ([place])   . OBESITY, TRUNCAL     Past Surgical History:  Procedure Laterality Date  . BREAST BIOPSY Right    [place]   . COLONOSCOPY    . MASTECTOMY Right   . PORT-A-CATH REMOVAL Left 05/13/2015   Procedure: REMOVAL PORT-A-CATH;  Surgeon: [name], MD;  Location: [place];  Service: General;  Laterality: Left;  . PORTACATH PLACEMENT Left 11/06/2014   Procedure: INSERTION PORT-A-CATH;  Surgeon: [name], MD;  Location:  [place];  Service: General;  Laterality: Left;  . SIMPLE MASTECTOMY WITH AXILLARY SENTINEL NODE BIOPSY Right 05/13/2015   Procedure: RIGHT TOTAL  MASTECTOMY WITH RIGHT  AXILLARY SENTINEL NODE BIOPSY;  Surgeon: [name], MD;  Location: [place];  Service: General;  Laterality: Right;  . TUBAL LIGATION      Social History   Social History  . Marital status: Single    Spouse name: N/A  . Number of children: 2  . Years of education: 12   Occupational History  . [place] History Main Topics  . Smoking status: Never Smoker  . Smokeless tobacco: Never Used  . Alcohol use No  . Drug use: No  . Sexual activity: Not Asked   Other Topics Concern  . 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    Patient is single and lives alone.    Patient is right handed.     Family History  Problem Relation Age of Onset  . Coronary artery disease Mother   . Heart disease Mother   . Diabetes Mother   . Hypertension Mother   . Stroke Mother   . Alcohol abuse Father   . COPD Maternal Grandmother   . Hypertension Other   . Hyperlipidemia Other   . Diabetes Other   . Colon cancer Neg Hx     Review of Systems  Constitutional: Negative for chills and fever.  Respiratory: Negative for  cough, shortness of breath and wheezing.   Cardiovascular: Negative for chest pain, palpitations and leg swelling.  Gastrointestinal: Negative for constipation and diarrhea.  Musculoskeletal: Positive for back pain.  Neurological: Positive for numbness. Negative for light-headedness and headaches.  Psychiatric/Behavioral: Positive for sleep disturbance. The patient is nervous/anxious.        Objective:   Vitals:   04/20/17 1118  BP: 112/74  Pulse: 78  Resp: 16  Temp: 98.1 F (36.7 C)   Filed Weights   04/20/17 1118  Weight: 218 lb (98.9 kg)   Body mass index is 36.28 kg/m.  Wt Readings from Last 3 Encounters:  04/20/17 218 lb (98.9 kg)  04/05/17 217 lb 1.6 oz (98.5 kg)  03/14/17 217 lb (98.4 kg)     Physical Exam Constitutional: Appears well-developed and well-nourished. No distress.  HENT:  Head: Normocephalic and atraumatic.  Neck: Neck supple. No tracheal deviation present. No thyromegaly present.  No cervical lymphadenopathy Cardiovascular: Normal rate, regular rhythm and normal heart sounds.   No murmur heard. No carotid bruit .  No edema Pulmonary/Chest: Effort normal and breath sounds normal. No respiratory distress. No has no wheezes. No rales.  Msk: no deformity, left index finger appearing normal Skin: Skin is warm and dry. Not diaphoretic.  Psychiatric: Normal mood and affect. Behavior is normal.          Assessment & Plan:   See Problem List for Assessment and Plan of chronic medical problems.

## 2017-04-20 ENCOUNTER — Encounter: Payer: Self-pay | Admitting: Internal Medicine

## 2017-04-20 ENCOUNTER — Telehealth: Payer: Self-pay | Admitting: Adult Health

## 2017-04-20 ENCOUNTER — Ambulatory Visit (INDEPENDENT_AMBULATORY_CARE_PROVIDER_SITE_OTHER): Payer: BC Managed Care – PPO | Admitting: Internal Medicine

## 2017-04-20 VITALS — BP 112/74 | HR 78 | Temp 98.1°F | Resp 16 | Wt 218.0 lb

## 2017-04-20 DIAGNOSIS — M65322 Trigger finger, left index finger: Secondary | ICD-10-CM | POA: Diagnosis not present

## 2017-04-20 DIAGNOSIS — I10 Essential (primary) hypertension: Secondary | ICD-10-CM

## 2017-04-20 DIAGNOSIS — E669 Obesity, unspecified: Secondary | ICD-10-CM | POA: Diagnosis not present

## 2017-04-20 DIAGNOSIS — R1012 Left upper quadrant pain: Secondary | ICD-10-CM

## 2017-04-20 DIAGNOSIS — M5416 Radiculopathy, lumbar region: Secondary | ICD-10-CM | POA: Diagnosis not present

## 2017-04-20 DIAGNOSIS — R413 Other amnesia: Secondary | ICD-10-CM

## 2017-04-20 MED ORDER — NALTREXONE-BUPROPION HCL ER 8-90 MG PO TB12: ORAL_TABLET | ORAL | 0 refills | Status: DC

## 2017-04-20 NOTE — Telephone Encounter (Signed)
Scheduled appt per 8/3 sch message - patient is aware of appt time and date.

## 2017-04-20 NOTE — Patient Instructions (Addendum)
Lets try contrave for weight loss - it was sent to your pharmacy.    No other changes in your medications.    Follow up in a few weeks.

## 2017-04-21 DIAGNOSIS — M65322 Trigger finger, left index finger: Secondary | ICD-10-CM | POA: Insufficient documentation

## 2017-04-21 DIAGNOSIS — R1012 Left upper quadrant pain: Secondary | ICD-10-CM

## 2017-04-21 DIAGNOSIS — M5416 Radiculopathy, lumbar region: Secondary | ICD-10-CM | POA: Insufficient documentation

## 2017-04-21 DIAGNOSIS — E669 Obesity, unspecified: Secondary | ICD-10-CM | POA: Insufficient documentation

## 2017-04-21 HISTORY — DX: Left upper quadrant pain: R10.12

## 2017-04-21 NOTE — Assessment & Plan Note (Signed)
Sounds muscular Drinks plenty of water Can take a muscle relaxer if severe

## 2017-04-21 NOTE — Assessment & Plan Note (Signed)
BP well controlled Current regimen effective and well tolerated Continue current medications at current doses  

## 2017-04-21 NOTE — Assessment & Plan Note (Signed)
Discussed options Will try contrave - will see if it is covered Continue regular exercise Dec portions Low sugar/carb diet Increase veges/fruits F/u in 4 weeks

## 2017-04-21 NOTE — Assessment & Plan Note (Signed)
?   MS related or lumbar radiculopathy Intermittent for one year She will discuss with neuro

## 2017-04-21 NOTE — Assessment & Plan Note (Signed)
Mild Deferred ortho referral

## 2017-04-21 NOTE — Assessment & Plan Note (Signed)
Blood work in past negative for reversible cause Likely multifactorial - chemo, fatigue from not sleeping well and stress/anxiety She deferred referral to neuropsych Will monitor

## 2017-04-25 ENCOUNTER — Telehealth: Payer: Self-pay | Admitting: *Deleted

## 2017-04-25 NOTE — Telephone Encounter (Signed)
Received call pt state MD rx weight los med, and pharmacy states MD has to contact insurance for approval. Inform pt sound like she need a PA will send msg to Lovena Le to see if she received PA form and give her a call bck w/reponse. Lovena Le have you received PA for the contrave if so give it to me I will proceed w/PA

## 2017-04-25 NOTE — Telephone Encounter (Signed)
PA information given to [place].

## 2017-04-26 NOTE — Telephone Encounter (Signed)
PA was approved from 04/26/17-08/26/17

## 2017-04-26 NOTE — Telephone Encounter (Signed)
Notified pt and pharmacy w/approval status...[name] [name]

## 2017-04-26 NOTE — Telephone Encounter (Signed)
Notified pt PA has been completed and faxed back to insurance. Will contact her back once we get an approval/or not from insurance.Key # [id number]....[name] [name]

## 2017-04-30 ENCOUNTER — Encounter: Payer: Self-pay | Admitting: Internal Medicine

## 2017-05-04 ENCOUNTER — Telehealth: Payer: Self-pay | Admitting: *Deleted

## 2017-05-04 NOTE — Telephone Encounter (Signed)
Rec'd call from pt stating she is skeptical taking the Contrave MD rx for weight loss. She doesn't feel comfortable with the side effects. Pt is wanting to try thr Phentermine that was given by another provider...[name] [name]

## 2017-05-05 MED ORDER — PHENTERMINE HCL 37.5 MG PO CAPS: 37.5000 mg | ORAL_CAPSULE | ORAL | 0 refills | Status: DC

## 2017-05-05 NOTE — Telephone Encounter (Signed)
Ok.  I printed the prescription.  Let her know this is only a short term medication and can only be taken for 3 months.

## 2017-05-07 NOTE — Telephone Encounter (Signed)
Notified pt w/MD response. Faxed script to [facility]...[name] [name]

## 2017-05-09 ENCOUNTER — Telehealth: Payer: Self-pay | Admitting: Emergency Medicine

## 2017-05-09 NOTE — Telephone Encounter (Signed)
PA for Phentermine completed. Key [id number]. Awaiting response

## 2017-05-10 MED ORDER — NALTREXONE-BUPROPION HCL ER 8-90 MG PO TB12: ORAL_TABLET | ORAL | 0 refills | Status: DC

## 2017-05-10 NOTE — Telephone Encounter (Signed)
PA has been denied. Contacted pt to inform. Pt states she would like the Contrave sent back to [facility] to try

## 2017-05-10 NOTE — Telephone Encounter (Signed)
sent 

## 2017-05-14 ENCOUNTER — Ambulatory Visit: Payer: BC Managed Care – PPO | Admitting: Internal Medicine

## 2017-05-29 ENCOUNTER — Ambulatory Visit: Payer: BC Managed Care – PPO | Admitting: Internal Medicine

## 2017-06-05 ENCOUNTER — Other Ambulatory Visit: Payer: Self-pay | Admitting: Internal Medicine

## 2017-06-05 ENCOUNTER — Telehealth: Payer: Self-pay | Admitting: Internal Medicine

## 2017-06-05 NOTE — Telephone Encounter (Signed)
Spoke with pt, she would like to hold off on blood work right now. Advised her to try nasal spray or saline spray and allergy meds for congestion.

## 2017-06-05 NOTE — Telephone Encounter (Signed)
Pt called said that she got bit by a tick a couple week ago, she said that she doesn't have a fever but feel kind of tired and sluggish.  She is a little conjugated.  She would like to know what she should to or what she should take.  She said that she is feeling a little light headed and dizzy.

## 2017-06-05 NOTE — Telephone Encounter (Signed)
We can check a blood test for lyme disease, but I do not think it sound like lyme disease --- let me know and I will order it

## 2017-06-15 ENCOUNTER — Encounter: Payer: Self-pay | Admitting: Internal Medicine

## 2017-06-15 ENCOUNTER — Ambulatory Visit (INDEPENDENT_AMBULATORY_CARE_PROVIDER_SITE_OTHER): Payer: BC Managed Care – PPO | Admitting: Internal Medicine

## 2017-06-15 VITALS — BP 128/78 | HR 96 | Temp 98.6°F | Resp 16 | Wt 219.0 lb

## 2017-06-15 DIAGNOSIS — E669 Obesity, unspecified: Secondary | ICD-10-CM

## 2017-06-15 DIAGNOSIS — K5909 Other constipation: Secondary | ICD-10-CM | POA: Diagnosis not present

## 2017-06-15 DIAGNOSIS — E559 Vitamin D deficiency, unspecified: Secondary | ICD-10-CM | POA: Diagnosis not present

## 2017-06-15 MED ORDER — LINACLOTIDE 145 MCG PO CAPS: 145.0000 ug | ORAL_CAPSULE | Freq: Every day | ORAL | 1 refills | Status: DC

## 2017-06-15 MED ORDER — VITAMIN D 50 MCG (2000 UT) PO CAPS: 2000.0000 [IU] | ORAL_CAPSULE | Freq: Every day | ORAL | Status: DC

## 2017-06-15 NOTE — Assessment & Plan Note (Signed)
Has taken high dose vitamin D previously Taking 1000 units daily Increase daily dose to 2000 units Will check vitamin d level in a few months

## 2017-06-15 NOTE — Patient Instructions (Addendum)
   Medications reviewed and updated.  Changes include trying Linzess 145 mcg for constipation.  Stop the miralax.  Take vitamin d 2000 units daily.    Your prescription(s) have been submitted to your pharmacy. Please take as directed and contact our office if you believe you are having problem(s) with the medication(s).   Please followup in

## 2017-06-15 NOTE — Progress Notes (Signed)
Subjective:    Patient ID: [name] [name], female    DOB: [birth year], 56 y.o.   MRN: [id number]  HPI The patient is here for follow up of obesity .  Obesity:  She started contrave three weeks ago and stopped it after 10 days.  She stopped it due to side effects - chest pain, SOB.   She has not had those symptoms since stopping the medication.  She is trying to watch what she eats.  She feels the weight will come on its own and she feels well and does not want to take any other medications  Constipation:  Every time she takes miralax she feels bloated.  She also takes a stool softener.  She feels like she is retaining fluids.  She has taken linzess 145 mcg in the past and it worked.  She wondered about trying that again.    Vitamin D def:  She would like clarification on what dose of the vitamin she should take.    Medications and allergies reviewed with patient and updated if appropriate.  Patient Active Problem List   Diagnosis Date Noted  . LUQ cramping 04/21/2017  . Lumbar radiculopathy 04/21/2017  . Trigger index finger of left hand 04/21/2017  . Obesity (BMI 35.0-39.9 without comorbidity) 04/21/2017  . Herpes zoster without complication 64/40/3474  . Left hip pain 10/31/2016  . Pes planus 10/31/2016  . Posterior tibialis muscle dysfunction 10/31/2016  . Depression 09/03/2016  . Memory difficulties 09/03/2016  . Breast pain, right 08/24/2016  . Chronic fatigue 06/08/2016  . Palpitations 04/04/2016  . 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 Malignant neoplasm of upper-outer quadrant of right breast in female, 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[place]) 11/03/2015  . S/P mastectomy 05/13/2015  . Chemotherapy-induced neuropathy ([place]) 04/21/2015  . Genetic testing 12/11/2014  . Chemotherapy induced neutropenia ([place]) 11/26/2014  . Breast cancer of upper-outer quadrant of right female breast ([place]) 10/23/2014  . Anxiety 04/29/2012  . Allergic rhinitis 11/29/2009  . 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  CONSTIPATION, CHRONIC 08/17/2008  . Dyslipidemia 04/24/2008  . METABOLIC SYNDROME X 25/95/6387  . GERD 01/07/2008  . OBESITY, TRUNCAL 09/20/2007  . Essential hypertension 09/20/2007  . Multiple sclerosis ([place]) 09/18/2007    Current Outpatient Prescriptions on File Prior to Visit  Medication Sig Dispense Refill  . acetaminophen (TYLENOL) 500 MG tablet Take 1,000 mg by mouth 2 (two) times daily as needed (pain). Reported on 09/23/2015    . ALPRAZolam (XANAX) 0.5 MG tablet Take 1 tablet (0.5 mg total) by mouth 2 (two) times daily as needed for anxiety or sleep. 90 tablet 0  . b complex vitamins tablet Take 1 tablet by mouth daily.    [name] COPAXONE 40 MG/ML SOSY INJECT ONE SYRINGE (40 MG) SUBCUTANEOUSLY THREE TIMES PER WEEK AT LEAST 48 HOURS APART. ALLOW SYRINGE TO WARM TO ROOM TEMPERATURE FOR 20 MIN 36 Syringe 3  . Diclofenac Sodium 2 % SOLN Place 2 g onto the skin 2 (two) times daily. 112 g 3  . ferrous gluconate (FERGON) 324 MG tablet Take 1 tablet (324 mg total) by mouth 2 (two) times daily with a meal. 180 tablet 3  . fluticasone (FLONASE) 50 MCG/ACT nasal spray USE 2 SPRAYS IN EACH       NOSTRIL DAILY 16 g 6  . gabapentin (NEURONTIN) 100 MG capsule 200 mg in morning and afternoon, 300 mg at night 630 capsule 1  . hydrochlorothiazide (HYDRODIURIL) 25 MG tablet TAKE 1 TABLET DAILY 90 tablet 0  . KLOR-CON  SPRINKLE 10 MEQ CR capsule TAKE 2 CAPSULES (20MEQ     TOTAL) TWICE DAILY 180 capsule 1  . loratadine-pseudoephedrine (CLARITIN-D 12 HOUR) 5-120 MG tablet Take 1 tablet by mouth daily as needed (seasonal allergies). 30 tablet 3  . losartan (COZAAR) 50 MG tablet TAKE 1 TABLET TWICE A DAY 180 tablet 0  . metoprolol tartrate (LOPRESSOR) 25 MG tablet Take 1 tablet (25 mg total) by mouth 2 (two) times daily. 180 tablet 3  . pantoprazole (PROTONIX) 40 MG tablet Take 1 tablet (40 mg total) by mouth daily. 90 tablet 2  . polyethylene glycol (MIRALAX / GLYCOLAX) packet Take 17 g by mouth daily. 30 each 11    . potassium chloride (MICRO-K) 10 MEQ CR capsule TAKE 2 CAPSULES (20MEQ     TOTAL) TWICE DAILY 180 capsule 1  . Probiotic Product [facility]) CAPS Take 1 capsule by mouth daily. 30 capsule 11  . valACYclovir (VALTREX) 1000 MG tablet Take 1 tablet (1,000 mg total) by mouth 3 (three) times daily. 21 tablet 0  . Vitamin D, Ergocalciferol, (DRISDOL) 50000 units CAPS capsule Take 1 capsule (50,000 Units total) by mouth every 7 (seven) days. 4 capsule 0  . [DISCONTINUED] mometasone (NASONEX) 50 MCG/ACT nasal spray Place 2 sprays into the nose daily. 17 g 2   No current facility-administered medications on file prior to visit.     Past Medical History:  Diagnosis Date  . Allergic rhinitis due to other allergen   . Allergy   . Angioedema    ACEI  . Anxiety   . Breast cancer of upper-outer quadrant of right female breast ([place]) 10/23/2014   s/p r mastectomy, neoadj chemo completed 04/21/15; neg genetic testing  . Colon polyps 05/2014   adenomatous, colo q 5y  . CONSTIPATION, CHRONIC   . GERD   . Hot flashes   . HYPERLIPIDEMIA   . HYPERTENSION   . Lactose intolerance   . Migraine headache   . Multiple sclerosis ([place])   . OBESITY, TRUNCAL     Past Surgical History:  Procedure Laterality Date  . BREAST BIOPSY Right    [place]   . COLONOSCOPY    . MASTECTOMY Right   . PORT-A-CATH REMOVAL Left 05/13/2015   Procedure: REMOVAL PORT-A-CATH;  Surgeon: [name], MD;  Location: [place];  Service: General;  Laterality: Left;  . PORTACATH PLACEMENT Left 11/06/2014   Procedure: INSERTION PORT-A-CATH;  Surgeon: [name], MD;  Location: [place];  Service: General;  Laterality: Left;  . SIMPLE MASTECTOMY WITH AXILLARY SENTINEL NODE BIOPSY Right 05/13/2015   Procedure: RIGHT TOTAL  MASTECTOMY WITH RIGHT  AXILLARY SENTINEL NODE BIOPSY;  Surgeon: [name], MD;  Location: [place];  Service: General;  Laterality: Right;  . TUBAL LIGATION      Social History   Social  History  . Marital status: Single    Spouse name: N/A  . Number of children: 2  . Years of education: 12   Occupational History  . [place] History Main Topics  . Smoking status: Never Smoker  . Smokeless tobacco: Never Used  . Alcohol use No  . Drug use: No  . Sexual activity: Not on file   Other Topics Concern  . 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    Patient is single and lives alone.    Patient  is right handed.     Family History  Problem Relation Age of Onset  . Coronary artery disease Mother   . Heart disease Mother   . Diabetes Mother   . Hypertension Mother   . Stroke Mother   . Alcohol abuse Father   . COPD Maternal Grandmother   . Hypertension Other   . Hyperlipidemia Other   . Diabetes Other   . Colon cancer Neg Hx     Review of Systems  Constitutional: Negative for fever.  Respiratory: Negative for shortness of breath.   Cardiovascular: Negative for chest pain and palpitations.  Gastrointestinal: Positive for abdominal distention (with constipation) and constipation. Negative for abdominal pain.       Objective:   Vitals:   06/15/17 1627  BP: 128/78  Pulse: 96  Resp: 16  Temp: 98.6 F (37 C)  SpO2: 96%   Wt Readings from Last 3 Encounters:  06/15/17 219 lb (99.3 kg)  04/20/17 218 lb (98.9 kg)  04/05/17 217 lb 1.6 oz (98.5 kg)   Body mass index is 36.44 kg/m.   Physical Exam    Constitutional: Appears well-developed and well-nourished. No distress.  HENT:  Head: Normocephalic and atraumatic.  Abdomen: obese, soft, non tender Skin: Skin is warm and dry. Not diaphoretic.  Psychiatric: Normal mood and affect. Behavior is normal.      Assessment & Plan:    See Problem List for Assessment and Plan of chronic medical problems.

## 2017-06-15 NOTE — Assessment & Plan Note (Signed)
Did not tolerate contrave Wants to avoid medication Work on lifestyle  Only Healthy diet, decreased portions, regular exercise discussed and stressed

## 2017-06-15 NOTE — Assessment & Plan Note (Signed)
Taking miralax and stool softeners, but feels bloated and like she is retaining fluids Will try linzess again, which worked well for her in the past Linzess 145 mcg daily

## 2017-06-18 ENCOUNTER — Telehealth: Payer: Self-pay | Admitting: Internal Medicine

## 2017-06-18 MED ORDER — POTASSIUM CHLORIDE ER 10 MEQ PO CPCR: ORAL_CAPSULE | ORAL | 1 refills | Status: DC

## 2017-06-18 NOTE — Telephone Encounter (Signed)
Reviewed chart pt is up-to-date sent refills to [facility]  

## 2017-06-18 NOTE — Telephone Encounter (Signed)
Pt called in for refill on her potassium chloride (MICRO-K) 10 MEQ CR capsule [[id number]]

## 2017-06-18 NOTE — Telephone Encounter (Signed)
potassium chloride (MICRO-K) 10 MEQ CR capsule  [facility] [place], [place] [facility] [address] [phone] (Phone) [phone] (Fax)   Patient is requesting a refill on this medication. Please advise. Thank you.

## 2017-06-19 ENCOUNTER — Telehealth: Payer: Self-pay

## 2017-06-19 MED ORDER — POTASSIUM CHLORIDE ER 10 MEQ PO CPCR: ORAL_CAPSULE | ORAL | 1 refills | Status: DC

## 2017-06-19 NOTE — Telephone Encounter (Signed)
Called to remind patient of SCP visit on 06/25/17 at 2 pm.  She stated that she would come to appt.

## 2017-06-25 ENCOUNTER — Ambulatory Visit (HOSPITAL_BASED_OUTPATIENT_CLINIC_OR_DEPARTMENT_OTHER): Payer: BC Managed Care – PPO | Admitting: Adult Health

## 2017-06-25 ENCOUNTER — Encounter: Payer: Self-pay | Admitting: Adult Health

## 2017-06-25 VITALS — BP 110/86 | HR 86 | Temp 98.1°F | Resp 18 | Ht 65.0 in | Wt 217.4 lb

## 2017-06-25 DIAGNOSIS — Z1239 Encounter for other screening for malignant neoplasm of breast: Secondary | ICD-10-CM

## 2017-06-25 DIAGNOSIS — C50411 Malignant neoplasm of upper-outer quadrant of right female breast: Secondary | ICD-10-CM | POA: Diagnosis not present

## 2017-06-25 DIAGNOSIS — Z171 Estrogen receptor negative status [ER-]: Secondary | ICD-10-CM

## 2017-06-25 NOTE — Progress Notes (Signed)
CLINIC:  Survivorship   REASON FOR VISIT:  Routine follow-up for history of breast cancer.   BRIEF ONCOLOGIC HISTORY:    Breast cancer of upper-outer quadrant of right female breast ([place])   10/15/2014 Mammogram    Right breast: new 2.9 cm mass with pleomorphic calcifications highly suspicious for a breast malignancy. Abnormal lymph nodes, the largest measuring 11 mm, are also suspicious for metastatic adenopathy.      10/20/2014 Initial Biopsy    Right breast needle core biopsy: invasive ductal carcinoma, ER- (0%), PR- (0%), HER2/neu negative, Ki67 71%. DCIS. Bx of right axillary LN: negative for malignancy      10/26/2014 Breast MRI    Right breast: Within the UOQ,  there is a 2.2 x 2.5 x 2.1 cm irregular enhancing mass with central susceptibility artifact.~5 mm anterior and superior to this is an additional 1.4 x 0.9 x 1.2 cm irregular enhancing mass, suspicious.      10/26/2014 Breast MRI    Right breast: Superior to the biopsied mass within the UOQ is approximately 6.5 x 4.8 cm of clumped non mass enhancement which extends from the level of the mass throughout the upper-outer quadrant of the right breast      10/30/2014 Procedure    Right axillary LN bx: negative for malignancy      11/02/2014 Procedure    Breast high / moderate risk ([facility]) reveals no clinically significant variant at ATM, BRCA1, BRCA2, CDH1, CHEK2, PALB2, PTEN, STK11, and TP53.      11/05/2014 Procedure    Right breast core needle bx: microinvasive ductal carcinoma (<0.1 cm) with associated high grade DCIS. ER- (0%), PR- (0%), HER2/neu negative (ratio 1.26), Ki67 63%      11/05/2014 Clinical Stage    Stage IIA: T2 N0      11/19/2014 - 04/21/2015 Neo-Adjuvant Chemotherapy    doxorubicin and cyclophosphamide x 4 cycles; carboplatin x 11 and paclitaxel x 12 cycles      05/13/2015 Definitive Surgery    Right mastectomy/[facility] Donne Hazel): IDC with extensive fibrosis, DCIS with calcs, focal usual ductal hyperplasia.  HER2/neu repeated and negative (ratio 1.26). 3 LN removed and negative      05/13/2015 Pathologic Stage    Stage IA: ypT1c ypN0      06/29/2015 Survivorship    Survivorship visit completed and copy of care plan provided to patient.        INTERVAL HISTORY:  Ms. Headrick presents to the Survivorship Clinic today for routine follow-up for her history of breast cancer.  Overall, she reports feeling quite well. She had a SCP visit in 06/2015 per [facility] note.  The patient does not remember receiving a care plan, or the appointment.  She made an additional appointment to review this information.      REVIEW OF SYSTEMS:  Review of Systems  Constitutional: Negative for appetite change, chills, diaphoresis, fatigue, fever and unexpected weight change.  HENT:   Negative for hearing loss and lump/mass.   Eyes: Negative for eye problems and icterus.  Respiratory: Negative for chest tightness, cough and shortness of breath.   Cardiovascular: Negative for chest pain, leg swelling and palpitations.  Gastrointestinal: Negative for abdominal distention, abdominal pain, constipation, diarrhea, nausea and vomiting.  Endocrine: Negative for hot flashes.  Skin: Negative for itching and rash.  Neurological: Negative for dizziness, extremity weakness, headaches and numbness.  Hematological: Negative for adenopathy. Does not bruise/bleed easily.  Psychiatric/Behavioral: Negative for depression. The patient is not nervous/anxious.  PAST MEDICAL/SURGICAL HISTORY:  Past Medical History:  Diagnosis Date  . Allergic rhinitis due to other allergen   . Allergy   . Angioedema    ACEI  . Anxiety   . Breast cancer of upper-outer quadrant of right female breast ([place]) 10/23/2014   s/p r mastectomy, neoadj chemo completed 04/21/15; neg genetic testing  . Colon polyps 05/2014   adenomatous, colo q 5y  . CONSTIPATION, CHRONIC   . GERD   . Hot flashes   . HYPERLIPIDEMIA   . HYPERTENSION   .  Lactose intolerance   . Migraine headache   . Multiple sclerosis ([place])   . OBESITY, TRUNCAL    Past Surgical History:  Procedure Laterality Date  . BREAST BIOPSY Right    [place]   . COLONOSCOPY    . MASTECTOMY Right   . PORT-A-CATH REMOVAL Left 05/13/2015   Procedure: REMOVAL PORT-A-CATH;  Surgeon: [name], MD;  Location: [place];  Service: General;  Laterality: Left;  . PORTACATH PLACEMENT Left 11/06/2014   Procedure: INSERTION PORT-A-CATH;  Surgeon: [name], MD;  Location: [place];  Service: General;  Laterality: Left;  . SIMPLE MASTECTOMY WITH AXILLARY SENTINEL NODE BIOPSY Right 05/13/2015   Procedure: RIGHT TOTAL  MASTECTOMY WITH RIGHT  AXILLARY SENTINEL NODE BIOPSY;  Surgeon: [name], MD;  Location: [place];  Service: General;  Laterality: Right;  . TUBAL LIGATION       ALLERGIES:  Allergies  Allergen Reactions  . Ace Inhibitors Anaphylaxis     Angioedema (tongue swelling)  . Clarithromycin Anaphylaxis    Throat swells  . Contrave [Naltrexone-Bupropion Hcl Er]     Chest and sob  . Levaquin [Levofloxacin] Other (See Comments)    Tendon pain - shoulder and calf     CURRENT MEDICATIONS:  Outpatient Encounter Prescriptions as of 06/25/2017  Medication Sig Note  . acetaminophen (TYLENOL) 500 MG tablet Take 1,000 mg by mouth 2 (two) times daily as needed (pain). Reported on 09/23/2015   . ALPRAZolam (XANAX) 0.5 MG tablet Take 1 tablet (0.5 mg total) by mouth 2 (two) times daily as needed for anxiety or sleep.   [name] b complex vitamins tablet Take 1 tablet by mouth daily. 05/07/2015: On hold until after procedure  . Cholecalciferol (VITAMIN D) 2000 units CAPS Take 1 capsule (2,000 Units total) by mouth daily.   [name] COPAXONE 40 MG/ML SOSY INJECT ONE SYRINGE (40 MG) SUBCUTANEOUSLY THREE TIMES PER WEEK AT LEAST 48 HOURS APART. ALLOW SYRINGE TO WARM TO ROOM TEMPERATURE FOR 20 MIN   . Diclofenac Sodium 2 % SOLN Place 2 g onto the skin 2 (two) times  daily.   . ferrous gluconate (FERGON) 324 MG tablet Take 1 tablet (324 mg total) by mouth 2 (two) times daily with a meal.   . fluticasone (FLONASE) 50 MCG/ACT nasal spray USE 2 SPRAYS IN EACH       NOSTRIL DAILY   . gabapentin (NEURONTIN) 100 MG capsule 200 mg in morning and afternoon, 300 mg at night   . hydrochlorothiazide (HYDRODIURIL) 25 MG tablet TAKE 1 TABLET DAILY   . KLOR-CON SPRINKLE 10 MEQ CR capsule TAKE 2 CAPSULES (20MEQ     TOTAL) TWICE DAILY   . linaclotide (LINZESS) 145 MCG CAPS capsule Take 1 capsule (145 mcg total) by mouth daily before breakfast.   . loratadine-pseudoephedrine (CLARITIN-D 12 HOUR) 5-120 MG tablet Take 1 tablet by mouth daily as needed (seasonal allergies).   . losartan (COZAAR) 50 MG tablet TAKE  1 TABLET TWICE A DAY   . metoprolol tartrate (LOPRESSOR) 25 MG tablet Take 1 tablet (25 mg total) by mouth 2 (two) times daily.   . pantoprazole (PROTONIX) 40 MG tablet Take 1 tablet (40 mg total) by mouth daily.   . polyethylene glycol (MIRALAX / GLYCOLAX) packet Take 17 g by mouth daily.   . potassium chloride (MICRO-K) 10 MEQ CR capsule TAKE 2 CAPSULES (20MEQ     TOTAL) TWICE DAILY   . Probiotic Product [facility]) CAPS Take 1 capsule by mouth daily.   . valACYclovir (VALTREX) 1000 MG tablet Take 1 tablet (1,000 mg total) by mouth 3 (three) times daily.    No facility-administered encounter medications on file as of 06/25/2017.      ONCOLOGIC FAMILY HISTORY:  Family History  Problem Relation Age of Onset  . Coronary artery disease Mother   . Heart disease Mother   . Diabetes Mother   . Hypertension Mother   . Stroke Mother   . Alcohol abuse Father   . COPD Maternal Grandmother   . Hypertension Other   . Hyperlipidemia Other   . Diabetes Other   . Colon cancer Neg Hx       PHYSICAL EXAMINATION:  Vital Signs: Vitals:   06/25/17 1355  BP: 110/86  Pulse: 86  Resp: 18  Temp: 98.1 F (36.7 C)  SpO2: 99%   Filed Weights   06/25/17 1355  Weight:  217 lb 6.4 oz (98.6 kg)   General: Well-nourished, well-appearing female in no acute distress.  Unaccompanied today.   HEENT: Head is normocephalic.  Pupils equal and reactive to light. Conjunctivae clear without exudate.  Sclerae anicteric. Oral mucosa is pink, moist.  Oropharynx is pink without lesions or erythema.  Lymph: No cervical, supraclavicular, or infraclavicular lymphadenopathy noted on palpation.  Cardiovascular: Regular rate and rhythm.[name] Respiratory: Clear to auscultation bilaterally. Chest expansion symmetric; breathing non-labored.  Breast Exam:  -Axilla: No axillary adenopathy bilaterally.  GI: Abdomen soft and round; non-tender, non-distended. Bowel sounds normoactive. No hepatosplenomegaly.   GU: Deferred.  Neuro: No focal deficits. Steady gait.  Psych: Mood and affect normal and appropriate for situation.  MSK: No focal spinal tenderness to palpation, full range of motion in bilateral upper extremities Extremities: No edema. Skin: Warm and dry.  LABORATORY DATA:  None for this visit   DIAGNOSTIC IMAGING:  Most recent mammogram:     ASSESSMENT AND PLAN:  Ms.. Krizek is a pleasant 56 y.o. female with history of Stage IIA right breast invasive ductal carcinoma, ER-/PR-/HER2-, diagnosed in 2016, treated with neoadjuvant chemotherapy and mastectomy.  She presents to the Survivorship Clinic for surveillance and routine follow-up.   1. History of breast cancer:  Ms. Ureste is currently clinically and radiographically without evidence of disease or recurrence of breast cancer. She will be due for mammogram in 10/2017; orders placed today.  She will f/u with Dr. Jana Hakim in 03/2018.  I reviewed her survivorship care plan with her in detail today.  I printed her out another copy as well.  I did not send it to her PCP as it appears they already received it.  I encouraged her to call me with any questions or concerns before her next visit at the cancer center, and I would be  happy to see her sooner, if needed.    2. Cancer screening:  Due to Ms. Halle's history and her age, she should receive screening for skin cancers, colon cancer, and gynecologic cancers. She was encouraged to  follow-up with her PCP for appropriate cancer screenings.   3. Health maintenance and wellness promotion: Ms. Ognibene was encouraged to consume 5-7 servings of fruits and vegetables per day. She was also encouraged to engage in moderate to vigorous exercise for 30 minutes per day most days of the week. She was instructed to limit her alcohol consumption and continue to abstain from tobacco use.    Dispo:  -Return to cancer center for follow up with Dr. Jana Hakim 03/2018 -Follow up with Dr. Donne Hazel in 07/2017 -Left breast mammogram due 10/2017   A total of (30) minutes of face-to-face time was spent with this patient with greater than 50% of that time in counseling and care-coordination.   Gardenia Phlegm, NP Survivorship Program [facility] [phone]   Note: PRIMARY CARE PROVIDER Binnie Rail, [place] [phone]

## 2017-06-26 ENCOUNTER — Telehealth: Payer: Self-pay | Admitting: Adult Health

## 2017-06-26 NOTE — Telephone Encounter (Signed)
No 10/8 los.  °

## 2017-06-28 ENCOUNTER — Other Ambulatory Visit: Payer: Self-pay | Admitting: Internal Medicine

## 2017-07-10 ENCOUNTER — Telehealth: Payer: Self-pay | Admitting: *Deleted

## 2017-07-10 NOTE — Telephone Encounter (Signed)
"  I need to speak with the person who does forms.  I left FMLA forms over two weeks ago and have not received them.  I hand delivered t hem to my doctor's nurse."  Will notify H.I.M.

## 2017-07-11 NOTE — Telephone Encounter (Signed)
FMLA form handed to Tiffany (HIM) Will follow up tomorrow to see if she has forms.

## 2017-07-18 ENCOUNTER — Telehealth: Payer: Self-pay | Admitting: Oncology

## 2017-07-18 NOTE — Telephone Encounter (Signed)
07/18/2017 @ 7:12 am spoke with patient and informed her that her FMLA papers were ready.  She stated she would be up here today for an appointment and she would pick them up at front desk receptionist, Ms. Darryll Capers

## 2017-08-10 ENCOUNTER — Other Ambulatory Visit: Payer: Self-pay | Admitting: Nurse Practitioner

## 2017-08-29 ENCOUNTER — Telehealth: Payer: Self-pay | Admitting: Internal Medicine

## 2017-08-29 NOTE — Telephone Encounter (Signed)
Pt   Asking   If  She  Can  Receive   An  Injection   In  The  Arm   She  Had a   Mastectomy  On  -  Pt  Advised  No .    Pt  Also  Reports  A slightly   painfull   Area   Under  Her breast on  The unafected  Side  . Not on the  Breast   No  Redness  Tenderness   Or  Swelling . Pt advised to call  Back  If  Symptoms  Change  Or  Do  Not  Go  Away

## 2017-09-03 ENCOUNTER — Telehealth: Payer: Self-pay | Admitting: *Deleted

## 2017-09-03 ENCOUNTER — Other Ambulatory Visit: Payer: Self-pay | Admitting: Internal Medicine

## 2017-09-03 NOTE — Telephone Encounter (Signed)
Rec'd call from pt stating on Friday she had her flu shot done. And now she has develop sore throat, nasal drainage, and cough. Denies any color to phlegm, but she is constantly coughing. No fever or SOB sxs. Currently taking her Claritin D wanting to know what else MD recommend for her to take...[name] [name]

## 2017-09-03 NOTE — Telephone Encounter (Signed)
Notified pt w/MD response. Pt also states she received a TDAP as well on 12/14 @ [employer] employee [employer]. Inform pt will update her immunization recird...[name] [name]

## 2017-09-03 NOTE — Telephone Encounter (Signed)
Sounds viral - can take mucinex, cough suppressants, tylenol.   Get plenty of rest and fluids

## 2017-09-05 ENCOUNTER — Encounter: Payer: Self-pay | Admitting: Internal Medicine

## 2017-09-05 ENCOUNTER — Ambulatory Visit: Payer: BC Managed Care – PPO | Admitting: Internal Medicine

## 2017-09-05 VITALS — BP 130/74 | HR 109 | Temp 99.2°F | Resp 18

## 2017-09-05 DIAGNOSIS — J22 Unspecified acute lower respiratory infection: Secondary | ICD-10-CM | POA: Insufficient documentation

## 2017-09-05 HISTORY — DX: Unspecified acute lower respiratory infection: J22

## 2017-09-05 MED ORDER — HYDROCODONE-HOMATROPINE 5-1.5 MG/5ML PO SYRP: 5.0000 mL | ORAL_SOLUTION | Freq: Three times a day (TID) | ORAL | 0 refills | Status: DC | PRN

## 2017-09-05 MED ORDER — DOXYCYCLINE HYCLATE 100 MG PO TABS: 100.0000 mg | ORAL_TABLET | Freq: Two times a day (BID) | ORAL | 0 refills | Status: DC

## 2017-09-05 NOTE — Telephone Encounter (Signed)
Pt call back also states she have also been running a fever..last night 100.2...[name] [name]

## 2017-09-05 NOTE — Telephone Encounter (Signed)
She should be seen.

## 2017-09-05 NOTE — Patient Instructions (Signed)
Take the antibiotic and cough syrup as prescribed.   Continue the over the counter cold medications  Rest, fluids.   Call if no improvement

## 2017-09-05 NOTE — Assessment & Plan Note (Signed)
Will start doxycycline x 10 days Hycodan cough syrup Continue mucinex Rest, fluids  Call if no improvement

## 2017-09-05 NOTE — Telephone Encounter (Signed)
Notified pt w/MD response. Ok for 3:45 will gave to [name] to add on...[name] [name]

## 2017-09-05 NOTE — Telephone Encounter (Signed)
Pt has taken OTC meds and they arent helping her her mucus is now a yellow brown color and she would like to know what she should do now?

## 2017-09-05 NOTE — Progress Notes (Signed)
Subjective:    Patient ID: [name] [name], female    DOB: Dec 22, 1960, 56 y.o.   MRN: [id number]  HPI She is here for an acute visit for cold symptoms.  Her symptoms started 3 days ago  She is experiencing fevers, nasal congestion, ear pressure, sinus pressure, productive cough and headaches.   She denies SOB and wheeze.    She has taken claritin D and mucinex.   Medications and allergies reviewed with patient and updated if appropriate.  Patient Active Problem List   Diagnosis Date Noted  . Vitamin D deficiency 06/15/2017  . LUQ cramping 04/21/2017  . Lumbar radiculopathy 04/21/2017  . Trigger index finger of left hand 04/21/2017  . Obesity (BMI 35.0-39.9 without comorbidity) 04/21/2017  . Herpes zoster without complication 87/86/7672  . Left hip pain 10/31/2016  . Pes planus 10/31/2016  . Posterior tibialis muscle dysfunction 10/31/2016  . Depression 09/03/2016  . Memory difficulties 09/03/2016  . Breast pain, right 08/24/2016  . Chronic fatigue 06/08/2016  . Palpitations 04/04/2016  . 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 Malignant neoplasm of upper-outer quadrant of right breast in female, 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[place]) 11/03/2015  . S/P mastectomy 05/13/2015  . 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 Genetic testing 12/11/2014  . Chemotherapy induced neutropenia ([place]) 11/26/2014  . Breast cancer of upper-outer quadrant of right female breast ([place]) 10/23/2014  . Anxiety 04/29/2012  . Allergic rhinitis 11/29/2009  . 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 CONSTIPATION, CHRONIC 08/17/2008  . Dyslipidemia 04/24/2008  . METABOLIC SYNDROME X 09/47/0962  . GERD 01/07/2008  . OBESITY, TRUNCAL 09/20/2007  . Essential hypertension 09/20/2007  . Multiple sclerosis ([place]) 09/18/2007    Current Outpatient Medications on File Prior to Visit  Medication Sig Dispense Refill  . acetaminophen (TYLENOL) 500 MG tablet Take 1,000 mg by mouth 2 (two) times daily as needed (pain). Reported  on 09/23/2015    . ALPRAZolam (XANAX) 0.5 MG tablet Take 1 tablet (0.5 mg total) by mouth 2 (two) times daily as needed for anxiety or sleep. 90 tablet 0  . b complex vitamins tablet Take 1 tablet by mouth daily.    . Cholecalciferol (VITAMIN D) 2000 units CAPS Take 1 capsule (2,000 Units total) by mouth daily. 30 capsule   . COPAXONE 40 MG/ML SOSY INJECT ONE SYRINGE (40 MG) SUBCUTANEOUSLY THREE TIMES PER WEEK AT LEAST 48 HOURS APART. ALLOW SYRINGE TO WARM TO ROOM TEMPERATURE FOR 20 MIN 36 Syringe 3  . Diclofenac Sodium 2 % SOLN Place 2 g onto the skin 2 (two) times daily. 112 g 3  . ferrous gluconate (FERGON) 324 MG tablet Take 1 tablet (324 mg total) by mouth 2 (two) times daily with a meal. 180 tablet 3  . fluticasone (FLONASE) 50 MCG/ACT nasal spray USE 2 SPRAYS IN EACH       NOSTRIL DAILY 16 g 6  . gabapentin (NEURONTIN) 100 MG capsule 200 mg in morning and afternoon, 300 mg at night 630 capsule 1  . hydrochlorothiazide (HYDRODIURIL) 25 MG tablet TAKE 1 TABLET DAILY 90 tablet 0  . hydrochlorothiazide (HYDRODIURIL) 25 MG tablet Take 1 tablet (25 mg total) by mouth daily. -- Office visit needed for further refills 90 tablet 0  . KLOR-CON SPRINKLE 10 MEQ CR capsule TAKE 2 CAPSULES (20MEQ     TOTAL) TWICE DAILY 180 capsule 1  . linaclotide (LINZESS) 145 MCG CAPS capsule Take 1 capsule (145 mcg total) by mouth daily before breakfast. 90 capsule 1  . loratadine-pseudoephedrine (CLARITIN-D 12 HOUR)  5-120 MG tablet Take 1 tablet by mouth daily as needed (seasonal allergies). 30 tablet 3  . losartan (COZAAR) 50 MG tablet TAKE 1 TABLET TWICE A DAY 180 tablet 0  . losartan (COZAAR) 50 MG tablet TAKE 1 TABLET TWICE A DAY 180 tablet 1  . metoprolol tartrate (LOPRESSOR) 25 MG tablet Take 1 tablet (25 mg total) by mouth 2 (two) times daily. 180 tablet 3  . pantoprazole (PROTONIX) 40 MG tablet Take 1 tablet (40 mg total) by mouth daily. 90 tablet 2  . polyethylene glycol (MIRALAX / GLYCOLAX) packet Take 17 g by  mouth daily. 30 each 11  . potassium chloride (MICRO-K) 10 MEQ CR capsule TAKE 2 CAPSULES (20MEQ     TOTAL) TWICE DAILY 180 capsule 1  . Probiotic Product [facility]) CAPS Take 1 capsule by mouth daily. 30 capsule 11  . valACYclovir (VALTREX) 1000 MG tablet Take 1 tablet (1,000 mg total) by mouth 3 (three) times daily. 21 tablet 0  . [DISCONTINUED] mometasone (NASONEX) 50 MCG/ACT nasal spray Place 2 sprays into the nose daily. 17 g 2   No current facility-administered medications on file prior to visit.     Past Medical History:  Diagnosis Date  . Allergic rhinitis due to other allergen   . Allergy   . Angioedema    ACEI  . Anxiety   . Breast cancer of upper-outer quadrant of right female breast ([place]) 10/23/2014   s/p r mastectomy, neoadj chemo completed 04/21/15; neg genetic testing  . Colon polyps 05/2014   adenomatous, colo q 5y  . CONSTIPATION, CHRONIC   . GERD   . Hot flashes   . HYPERLIPIDEMIA   . HYPERTENSION   . Lactose intolerance   . Migraine headache   . Multiple sclerosis ([place])   . OBESITY, TRUNCAL     Past Surgical History:  Procedure Laterality Date  . BREAST BIOPSY Right    [place]   . COLONOSCOPY    . MASTECTOMY Right   . PORT-A-CATH REMOVAL Left 05/13/2015   Procedure: REMOVAL PORT-A-CATH;  Surgeon: [name], MD;  Location: [place];  Service: General;  Laterality: Left;  . PORTACATH PLACEMENT Left 11/06/2014   Procedure: INSERTION PORT-A-CATH;  Surgeon: [name], MD;  Location: [place];  Service: General;  Laterality: Left;  . SIMPLE MASTECTOMY WITH AXILLARY SENTINEL NODE BIOPSY Right 05/13/2015   Procedure: RIGHT TOTAL  MASTECTOMY WITH RIGHT  AXILLARY SENTINEL NODE BIOPSY;  Surgeon: [name], MD;  Location: [place];  Service: General;  Laterality: Right;  . TUBAL LIGATION      Social History   Socioeconomic History  . Marital status: Single    Spouse name: None  . Number of children: 2  . Years of education: 63  .  Highest education level: None  Social Needs  . Financial resource strain: None  . Food insecurity - worry: None  . Food insecurity - inability: None  . Transportation needs - medical: None  . Transportation needs - non-medical: None  Occupational History  . Occupation: Solicitor: [employer]  Tobacco Use  . Smoking status: Never Smoker  . Smokeless tobacco: Never Used  Substance and Sexual Activity  . Alcohol use: No    Alcohol/week: 0.0 oz  . Drug use: No  . Sexual activity: None  Other Topics Concern  . None  Social History Narrative   Patient is a Programmer, multimedia for [employer].    Patient has a Chief Financial Officer  education.    Patient is single and lives alone.    Patient is right handed.     Family History  Problem Relation Age of Onset  . Coronary artery disease Mother   . Heart disease Mother   . Diabetes Mother   . Hypertension Mother   . Stroke Mother   . Alcohol abuse Father   . COPD Maternal Grandmother   . Hypertension Other   . Hyperlipidemia Other   . Diabetes Other   . Colon cancer Neg Hx     Review of Systems  Constitutional: Positive for fever. Negative for chills.  HENT: Positive for congestion, ear pain and sinus pressure. Negative for sinus pain and sore throat.   Respiratory: Positive for cough. Negative for shortness of breath and wheezing.   Neurological: Positive for headaches.       Objective:   Vitals:   09/05/17 1601  BP: 130/74  Pulse: (!) 109  Resp: 18  Temp: 99.2 F (37.3 C)  SpO2: 97%   There were no vitals filed for this visit. There is no height or weight on file to calculate BMI.  Wt Readings from Last 3 Encounters:  06/25/17 217 lb 6.4 oz (98.6 kg)  06/15/17 219 lb (99.3 kg)  04/20/17 218 lb (98.9 kg)     Physical Exam GENERAL APPEARANCE: Appears stated age, well appearing, NAD EYES: conjunctiva clear, no icterus HEENT: bilateral tympanic membranes and ear canals normal,  oropharynx with mild erythema, no thyromegaly, trachea midline, no cervical or supraclavicular lymphadenopathy LUNGS: Clear to auscultation without wheeze or crackles, unlabored breathing, good air entry bilaterally CARDIOVASCULAR: Normal S1,S2 without murmurs, no edema SKIN: warm, dry        Assessment & Plan:   See Problem List for Assessment and Plan of chronic medical problems.

## 2017-09-10 ENCOUNTER — Ambulatory Visit: Payer: BC Managed Care – PPO | Admitting: Internal Medicine

## 2017-09-10 ENCOUNTER — Encounter: Payer: Self-pay | Admitting: Internal Medicine

## 2017-09-10 VITALS — BP 124/80 | HR 89 | Temp 98.4°F | Ht 65.0 in

## 2017-09-10 DIAGNOSIS — J22 Unspecified acute lower respiratory infection: Secondary | ICD-10-CM

## 2017-09-10 DIAGNOSIS — R062 Wheezing: Secondary | ICD-10-CM | POA: Diagnosis not present

## 2017-09-10 HISTORY — DX: Wheezing: R06.2

## 2017-09-10 MED ORDER — PREDNISONE 10 MG PO TABS: ORAL_TABLET | ORAL | 0 refills | Status: DC

## 2017-09-10 MED ORDER — FLUCONAZOLE 150 MG PO TABS
150.0000 mg | ORAL_TABLET | Freq: Once | ORAL | 0 refills | Status: AC
Start: 2017-09-10 — End: 2017-09-10

## 2017-09-10 MED ORDER — CEFPODOXIME PROXETIL 200 MG PO TABS: 200.0000 mg | ORAL_TABLET | Freq: Two times a day (BID) | ORAL | 0 refills | Status: DC

## 2017-09-10 MED ORDER — IPRATROPIUM-ALBUTEROL 0.5-2.5 (3) MG/3ML IN SOLN
3.0000 mL | Freq: Once | RESPIRATORY_TRACT | Status: AC
  Administered 2017-09-10: 3 mL via RESPIRATORY_TRACT

## 2017-09-10 NOTE — Patient Instructions (Addendum)
You had a breathing treatment today.    Stop the doxycycline.  Start the new antibiotic.  Continue the mucinex.  Take the steroids as prescribed.  Use the cough syrup.    Take diflucan if needed.   Call if no improvement

## 2017-09-10 NOTE — Assessment & Plan Note (Signed)
Respiratory infection does not seem to be improving and she has significant wheezing on exam DuoNeb treatment today, which did improve her breathing Prednisone taper Deferred inhaler Discontinue doxycycline and start Omnicef Continue Claritin-D, Mucinex and cough medications

## 2017-09-10 NOTE — Progress Notes (Signed)
Subjective:    Patient ID: [name] [name], female    DOB: 28-Jan-1961, 56 y.o.   MRN: [id number]  HPI She is here for an acute visit for cold symptoms.  Her symptoms have not improved over the last several days with the antibiotic, over-the-counter cold medications and cough syrup.  She is experiencing increased wheezing, her cough continues to be productive and she continues to experience headaches, nasal congestion and sinus pain.  She denies any fevers, chills, shortness of breath, sore throat or ear pain.  She feels like the antibiotic and current treatment is not working.  She has taken mucinex, claritin D and cough medications.    Medications and allergies reviewed with patient and updated if appropriate.  Patient Active Problem List   Diagnosis Date Noted  . LRTI (lower respiratory tract infection) 09/05/2017  . Vitamin D deficiency 06/15/2017  . LUQ cramping 04/21/2017  . Lumbar radiculopathy 04/21/2017  . Trigger index finger of left hand 04/21/2017  . Obesity (BMI 35.0-39.9 without comorbidity) 04/21/2017  . Herpes zoster without complication 40/34/7425  . Left hip pain 10/31/2016  . Pes planus 10/31/2016  . Posterior tibialis muscle dysfunction 10/31/2016  . Depression 09/03/2016  . Memory difficulties 09/03/2016  . Breast pain, right 08/24/2016  . Chronic fatigue 06/08/2016  . Palpitations 04/04/2016  . 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 Malignant neoplasm of upper-outer quadrant of right breast in female, 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[place]) 11/03/2015  . S/P mastectomy 05/13/2015  . Chemotherapy-induced neuropathy ([place]) 04/21/2015  . Genetic testing 12/11/2014  . Chemotherapy induced neutropenia ([place]) 11/26/2014  . Breast cancer of upper-outer quadrant of right female breast ([place]) 10/23/2014  . Anxiety 04/29/2012  . Allergic rhinitis 11/29/2009  . 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 CONSTIPATION, CHRONIC 08/17/2008  . Dyslipidemia 04/24/2008  . METABOLIC SYNDROME X 95/63/8756    . GERD 01/07/2008  . OBESITY, TRUNCAL 09/20/2007  . Essential hypertension 09/20/2007  . Multiple sclerosis ([place]) 09/18/2007    Current Outpatient Medications on File Prior to Visit  Medication Sig Dispense Refill  . acetaminophen (TYLENOL) 500 MG tablet Take 1,000 mg by mouth 2 (two) times daily as needed (pain). Reported on 09/23/2015    . ALPRAZolam (XANAX) 0.5 MG tablet Take 1 tablet (0.5 mg total) by mouth 2 (two) times daily as needed for anxiety or sleep. 90 tablet 0  . b complex vitamins tablet Take 1 tablet by mouth daily.    . Cholecalciferol (VITAMIN D) 2000 units CAPS Take 1 capsule (2,000 Units total) by mouth daily. 30 capsule   . COPAXONE 40 MG/ML SOSY INJECT ONE SYRINGE (40 MG) SUBCUTANEOUSLY THREE TIMES PER WEEK AT LEAST 48 HOURS APART. ALLOW SYRINGE TO WARM TO ROOM TEMPERATURE FOR 20 MIN 36 Syringe 3  . Diclofenac Sodium 2 % SOLN Place 2 g onto the skin 2 (two) times daily. 112 g 3  . doxycycline (VIBRA-TABS) 100 MG tablet Take 1 tablet (100 mg total) by mouth 2 (two) times daily. 20 tablet 0  . ferrous gluconate (FERGON) 324 MG tablet Take 1 tablet (324 mg total) by mouth 2 (two) times daily with a meal. 180 tablet 3  . fluticasone (FLONASE) 50 MCG/ACT nasal spray USE 2 SPRAYS IN EACH       NOSTRIL DAILY 16 g 6  . gabapentin (NEURONTIN) 100 MG capsule 200 mg in morning and afternoon, 300 mg at night 630 capsule 1  . hydrochlorothiazide (HYDRODIURIL) 25 MG tablet TAKE 1 TABLET DAILY 90 tablet 0  . hydrochlorothiazide (HYDRODIURIL)  25 MG tablet Take 1 tablet (25 mg total) by mouth daily. -- Office visit needed for further refills 90 tablet 0  . HYDROcodone-homatropine (HYCODAN) 5-1.5 MG/5ML syrup Take 5 mLs by mouth every 8 (eight) hours as needed for cough. 120 mL 0  . KLOR-CON SPRINKLE 10 MEQ CR capsule TAKE 2 CAPSULES (20MEQ     TOTAL) TWICE DAILY 180 capsule 1  . linaclotide (LINZESS) 145 MCG CAPS capsule Take 1 capsule (145 mcg total) by mouth daily before breakfast. 90  capsule 1  . loratadine-pseudoephedrine (CLARITIN-D 12 HOUR) 5-120 MG tablet Take 1 tablet by mouth daily as needed (seasonal allergies). 30 tablet 3  . losartan (COZAAR) 50 MG tablet TAKE 1 TABLET TWICE A DAY 180 tablet 0  . losartan (COZAAR) 50 MG tablet TAKE 1 TABLET TWICE A DAY 180 tablet 1  . metoprolol tartrate (LOPRESSOR) 25 MG tablet Take 1 tablet (25 mg total) by mouth 2 (two) times daily. 180 tablet 3  . pantoprazole (PROTONIX) 40 MG tablet Take 1 tablet (40 mg total) by mouth daily. 90 tablet 2  . polyethylene glycol (MIRALAX / GLYCOLAX) packet Take 17 g by mouth daily. 30 each 11  . potassium chloride (MICRO-K) 10 MEQ CR capsule TAKE 2 CAPSULES (20MEQ     TOTAL) TWICE DAILY 180 capsule 1  . Probiotic Product [facility]) CAPS Take 1 capsule by mouth daily. 30 capsule 11  . valACYclovir (VALTREX) 1000 MG tablet Take 1 tablet (1,000 mg total) by mouth 3 (three) times daily. 21 tablet 0  . [DISCONTINUED] mometasone (NASONEX) 50 MCG/ACT nasal spray Place 2 sprays into the nose daily. 17 g 2   No current facility-administered medications on file prior to visit.     Past Medical History:  Diagnosis Date  . Allergic rhinitis due to other allergen   . Allergy   . Angioedema    ACEI  . Anxiety   . Breast cancer of upper-outer quadrant of right female breast ([place]) 10/23/2014   s/p r mastectomy, neoadj chemo completed 04/21/15; neg genetic testing  . Colon polyps 05/2014   adenomatous, colo q 5y  . CONSTIPATION, CHRONIC   . GERD   . Hot flashes   . HYPERLIPIDEMIA   . HYPERTENSION   . Lactose intolerance   . Migraine headache   . Multiple sclerosis ([place])   . OBESITY, TRUNCAL     Past Surgical History:  Procedure Laterality Date  . BREAST BIOPSY Right    [place]   . COLONOSCOPY    . MASTECTOMY Right   . PORT-A-CATH REMOVAL Left 05/13/2015   Procedure: REMOVAL PORT-A-CATH;  Surgeon: [name], MD;  Location[place];  Service: General;  Laterality: Left;  . PORTACATH PLACEMENT  Left 11/06/2014   Procedure: INSERTION PORT-A-CATH;  Surgeon: [name], MD;  Location: [place];  Service: General;  Laterality: Left;  . SIMPLE MASTECTOMY WITH AXILLARY SENTINEL NODE BIOPSY Right 05/13/2015   Procedure: RIGHT TOTAL  MASTECTOMY WITH RIGHT  AXILLARY SENTINEL NODE BIOPSY;  Surgeon: [name], MD;  Location: [place];  Service: General;  Laterality: Right;  . TUBAL LIGATION      Social History   Socioeconomic History  . Marital status: Single    Spouse name: None  . Number of children: 2  . Years of education: 45  . Highest education level: None  Social Needs  . Financial resource strain: None  . Food insecurity - worry: None  . Food insecurity - inability: None  .  Transportation needs - medical: None  . Transportation needs - non-medical: None  Occupational History  . Occupation: Solicitor: [employer]  Tobacco Use  . Smoking status: Never Smoker  . Smokeless tobacco: Never Used  Substance and Sexual Activity  . Alcohol use: No    Alcohol/week: 0.0 oz  . Drug use: No  . Sexual activity: None  Other Topics Concern  . 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    Patient is single and lives alone.    Patient is right handed.     Family History  Problem Relation Age of Onset  . Coronary artery disease Mother   . Heart disease Mother   . Diabetes Mother   . Hypertension Mother   . Stroke Mother   . Alcohol abuse Father   . COPD Maternal Grandmother   . Hypertension Other   . Hyperlipidemia Other   . Diabetes Other   . Colon cancer Neg Hx     Review of Systems  Constitutional: Negative for chills and fever.  HENT: Positive for congestion and sinus pain. Negative for ear pain and sore throat.   Respiratory: Positive for cough and wheezing. Negative for shortness of breath.   Cardiovascular: Negative for  chest pain.  Neurological: Positive for headaches.       Objective:   Vitals:   09/10/17 0943  BP: 124/80  Pulse: 89  Temp: 98.4 F (36.9 C)  SpO2: 98%   Filed Weights   Body mass index is 36.18 kg/m.  Wt Readings from Last 3 Encounters:  06/25/17 217 lb 6.4 oz (98.6 kg)  06/15/17 219 lb (99.3 kg)  04/20/17 218 lb (98.9 kg)     Physical Exam GENERAL APPEARANCE: Appears stated age, well appearing, NAD EYES: conjunctiva clear, no icterus HEENT: bilateral tympanic membranes and ear canals normal, oropharynx with no erythema, no thyromegaly, trachea midline, no cervical or supraclavicular lymphadenopathy LUNGS: unlabored breathing, good air entry bilaterally.  Significant bilateral expiratory wheeze, no crackles CARDIOVASCULAR: Normal S1,S2 without murmurs, no edema SKIN: warm, dry        Assessment & Plan:   See Problem List for Assessment and Plan of chronic medical problems.

## 2017-09-10 NOTE — Assessment & Plan Note (Signed)
No improvement in symptoms and has had increased wheezing that is significant on exam We will stop doxycycline Start Omnicef, oral prednisone taper Continue Mucinex, Claritin-D and Hycodan cough syrup DuoNeb treatment today Call if no improvement

## 2017-09-12 ENCOUNTER — Encounter: Payer: Self-pay | Admitting: Internal Medicine

## 2017-09-13 NOTE — Telephone Encounter (Signed)
Rec'd call from pt checking status on email. Inform pt will call her back once MD address note...[name] [name]

## 2017-09-14 ENCOUNTER — Ambulatory Visit (INDEPENDENT_AMBULATORY_CARE_PROVIDER_SITE_OTHER): Payer: BC Managed Care – PPO

## 2017-09-14 ENCOUNTER — Telehealth: Payer: Self-pay | Admitting: Emergency Medicine

## 2017-09-14 DIAGNOSIS — R062 Wheezing: Secondary | ICD-10-CM

## 2017-09-14 DIAGNOSIS — J22 Unspecified acute lower respiratory infection: Secondary | ICD-10-CM | POA: Diagnosis not present

## 2017-09-14 MED ORDER — IPRATROPIUM-ALBUTEROL 0.5-2.5 (3) MG/3ML IN SOLN
3.0000 mL | Freq: Once | RESPIRATORY_TRACT | Status: AC
  Administered 2017-09-14: 3 mL via RESPIRATORY_TRACT

## 2017-09-14 NOTE — Telephone Encounter (Signed)
LVM for pt to call back and schedule nurse visit for neb treatment.

## 2017-09-14 NOTE — Progress Notes (Signed)
Neb treatment given   Binnie Rail, MD

## 2017-09-14 NOTE — Telephone Encounter (Signed)
error 

## 2017-09-16 IMAGING — DX DG LUMBAR SPINE COMPLETE 4+V
5 series · 5 of 5 positions shown · non-contrast
Comparison: None in PACs

CLINICAL DATA: Two weeks of left hip pain with no known injury

EXAM:
LUMBAR SPINE - COMPLETE 4+ VIEW

[l-spine ap]
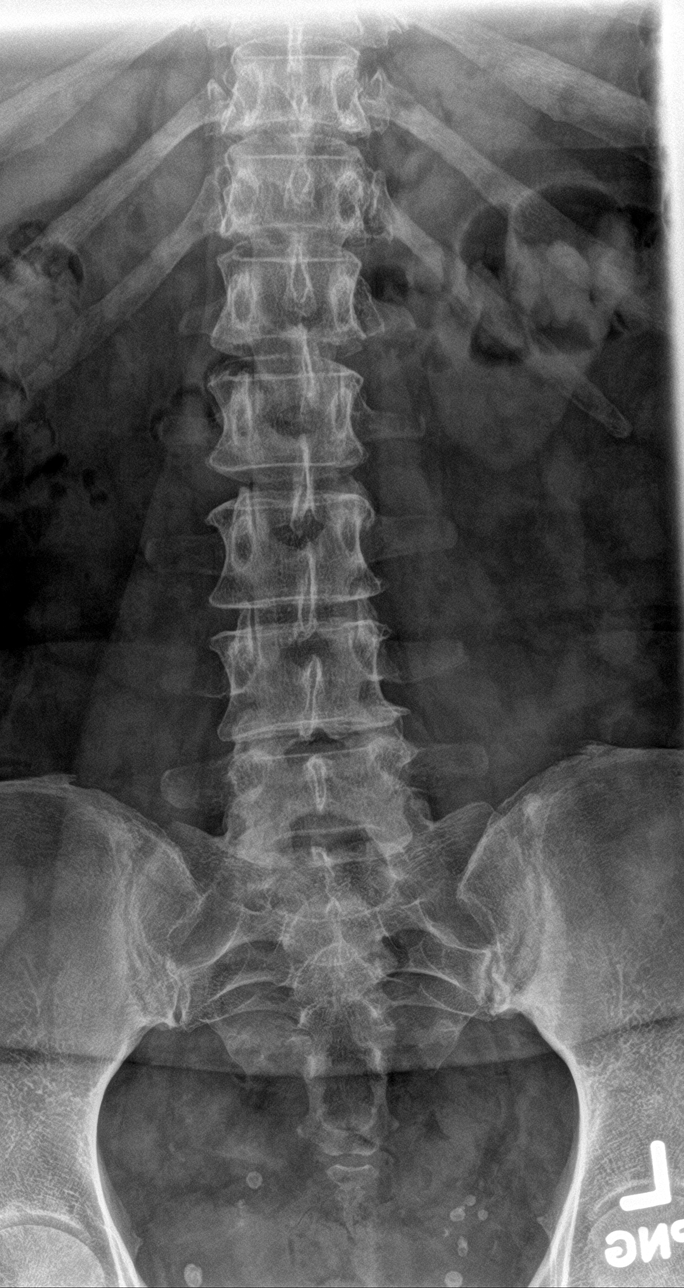

[l-spine obl (1 of 2)]
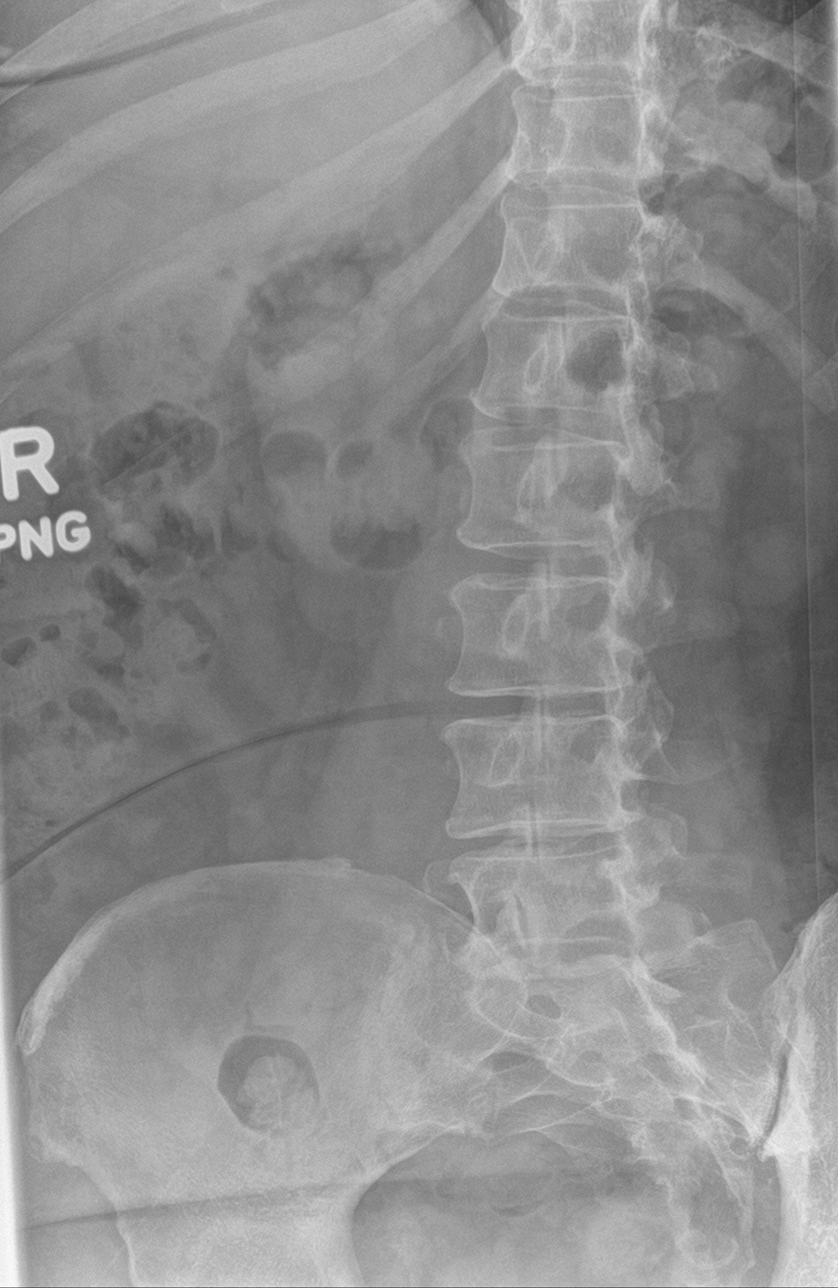

[l-spine obl (2 of 2)]
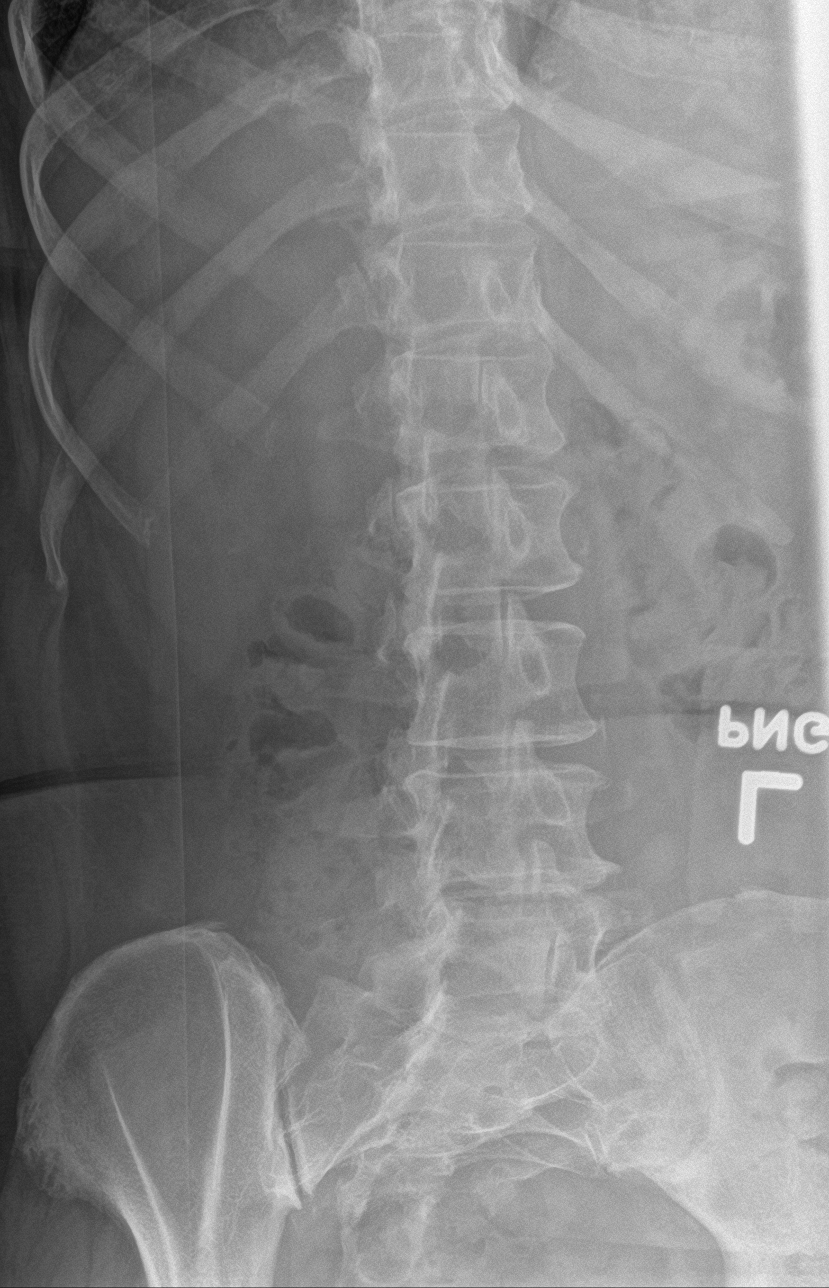

[l-spine lat]
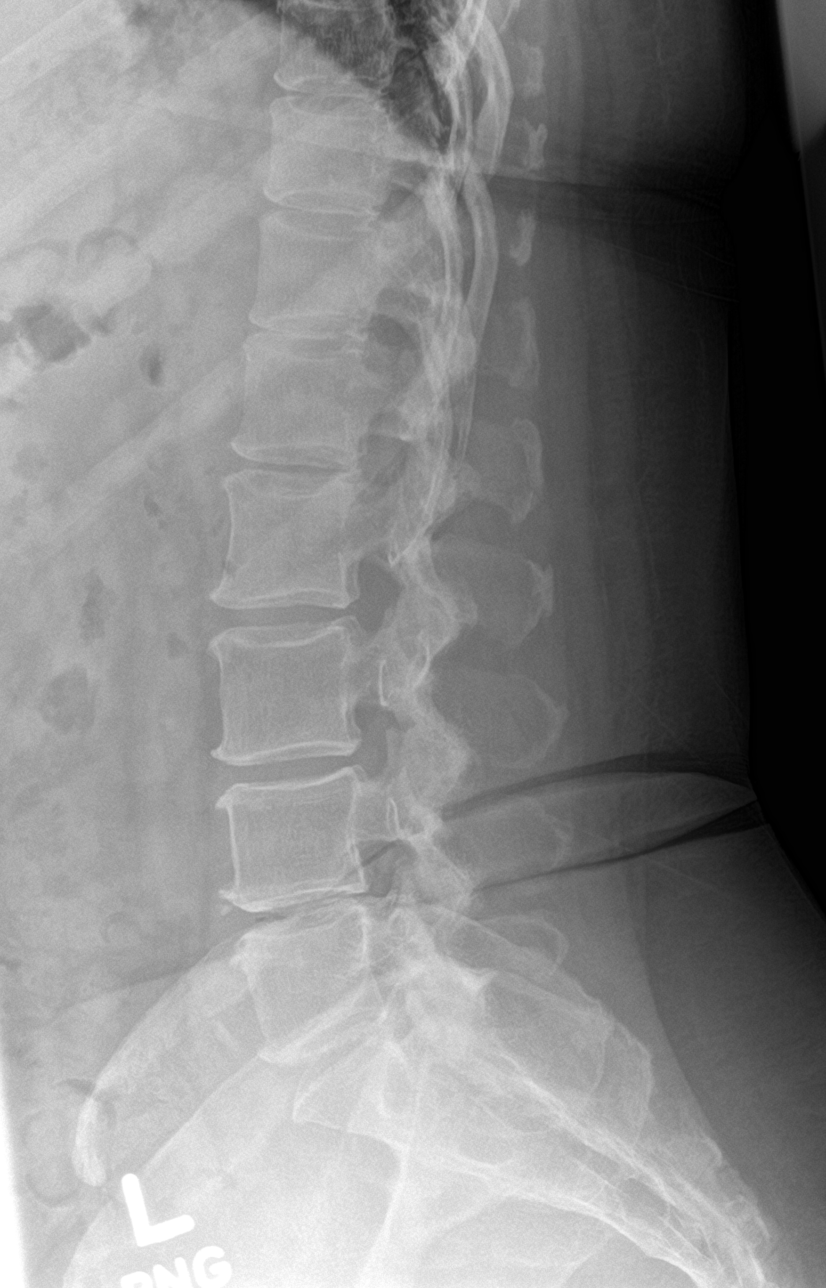

[l-spine spot]
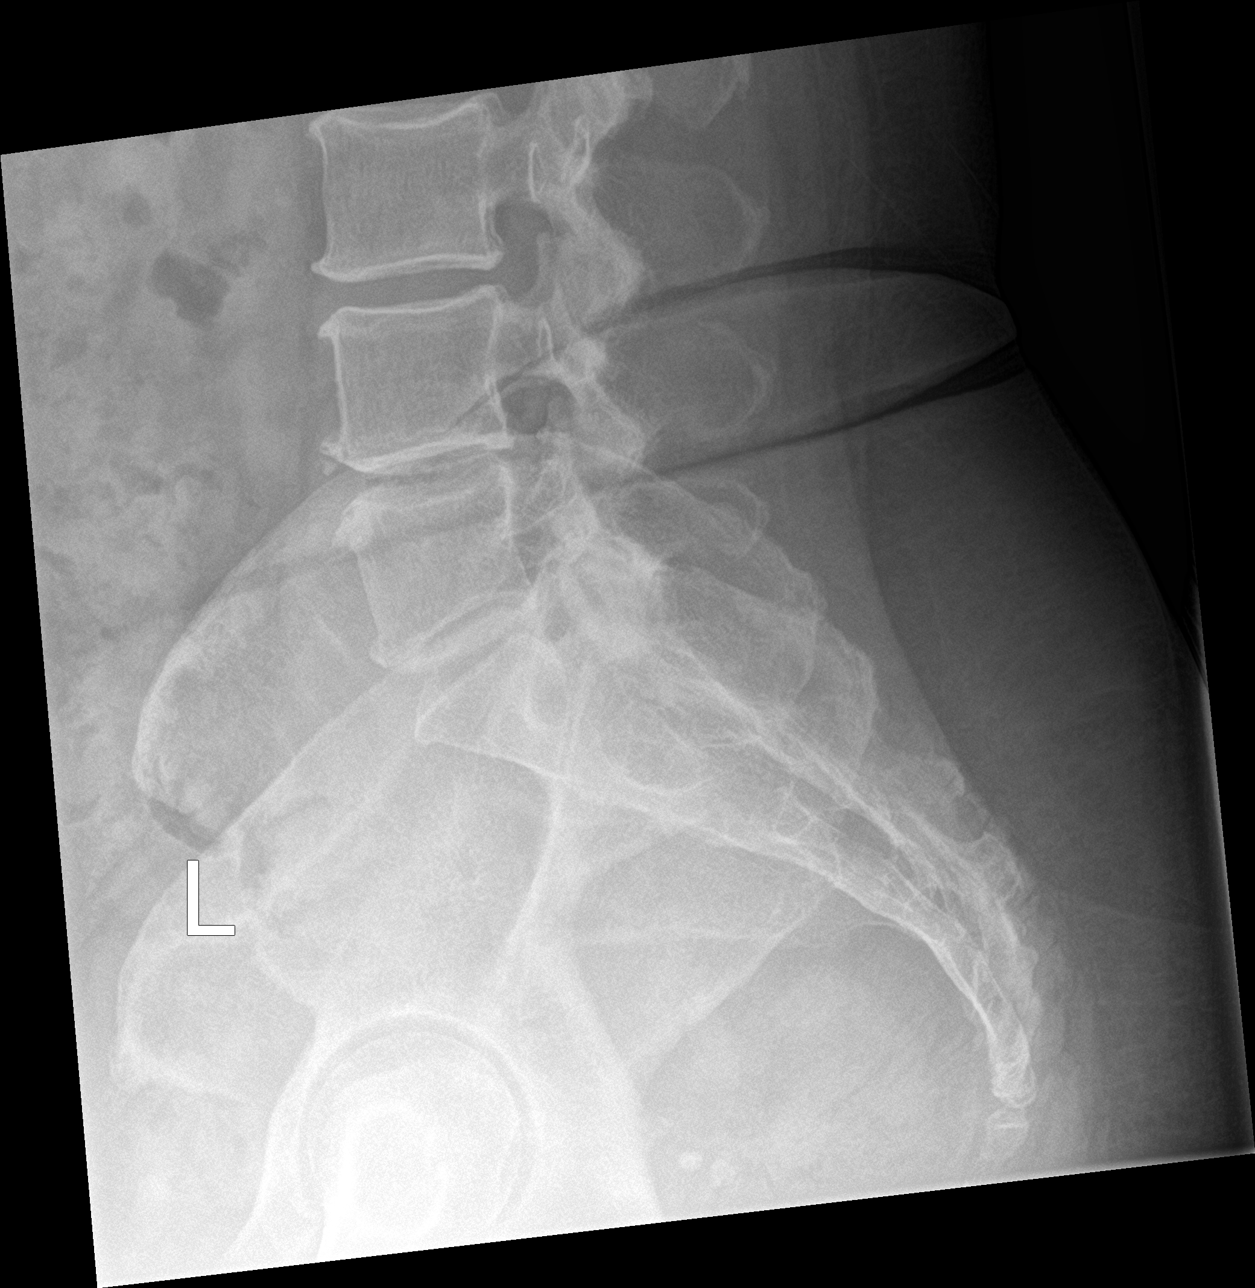

[5 of 5 positions shown; findings below may reference images not displayed]

FINDINGS: The lumbar vertebral bodies are preserved in height. There is no
lytic or blastic lesion. There is disc space narrowing at L4-5.
There is no spondylolisthesis. There small anterior endplate
osteophytes at multiple levels. The pedicles and transverse
processes are intact. There is mild facet joint hypertrophy at
L5-S1. The observed portions of the sacrum are normal.
IMPRESSION: There is degenerative disc space narrowing at L4-5 with facet joint
hypertrophy at L5-S1 consistent with osteoarthritis. No compression
fracture nor other acute bony abnormality.

## 2017-09-17 ENCOUNTER — Telehealth: Payer: Self-pay | Admitting: Internal Medicine

## 2017-09-17 NOTE — Telephone Encounter (Deleted)
Copied from [place] [id number]. Topic: Inquiry >> Sep 17, 2017  2:58 PM Cecelia Byars, NT wrote: Reason for CRM: Patient would like to know if inhaler can be called in for her at [facility] [place] please advise [phone]

## 2017-09-17 NOTE — Telephone Encounter (Deleted)
Copied from [place] [id number]. Topic: Inquiry >> Sep 17, 2017  2:58 PM Cecelia Byars, NT wrote: Reason for CRM: Patient would like to know if inhaler can be called in for her at [facility] [place] please advise [phone]

## 2017-09-22 ENCOUNTER — Ambulatory Visit: Payer: BC Managed Care – PPO | Admitting: Family

## 2017-09-22 VITALS — BP 126/90 | HR 82 | Temp 98.3°F | Ht 65.0 in | Wt 215.0 lb

## 2017-09-22 DIAGNOSIS — J9801 Acute bronchospasm: Secondary | ICD-10-CM | POA: Diagnosis not present

## 2017-09-22 DIAGNOSIS — R053 Chronic cough: Secondary | ICD-10-CM

## 2017-09-22 DIAGNOSIS — R05 Cough: Secondary | ICD-10-CM

## 2017-09-22 MED ORDER — BUDESONIDE-FORMOTEROL FUMARATE 160-4.5 MCG/ACT IN AERO: 2.0000 | INHALATION_SPRAY | Freq: Two times a day (BID) | RESPIRATORY_TRACT | 0 refills | Status: DC

## 2017-09-22 MED ORDER — BENZONATATE 100 MG PO CAPS: 200.0000 mg | ORAL_CAPSULE | Freq: Three times a day (TID) | ORAL | 0 refills | Status: DC | PRN

## 2017-09-22 NOTE — Patient Instructions (Signed)
Please start using your Flonase; please come back and get your Chest X-ray done.    Bronchospasm, Adult Bronchospasm is a tightening of the airways going into the lungs. During an episode, it may be harder to breathe. You may cough, and you may make a whistling sound when you breathe (wheeze). This condition often affects people with asthma. What are the causes? This condition is caused by swelling and irritation in the airways. It can be triggered by:  An infection (common).  Seasonal allergies.  An allergic reaction.  Exercise.  Irritants. These include pollution, cigarette smoke, strong odors, aerosol sprays, and paint fumes.  Weather changes. Winds increase molds and pollens in the air. Cold air may cause swelling.  Stress and emotional upset.  What are the signs or symptoms? Symptoms of this condition include:  Wheezing. If the episode was triggered by an allergy, wheezing may start right away or hours later.  Nighttime coughing.  Frequent or severe coughing with a simple cold.  Chest tightness.  Shortness of breath.  Decreased ability to exercise.  How is this diagnosed? This condition is usually diagnosed with a review of your medical history and a physical exam. Tests, such as lung function tests, are sometimes done to look for other conditions. The need for a chest X-ray depends on where the wheezing occurs and whether it is the first time you have wheezed. How is this treated? This condition may be treated with:  Inhaled medicines. These open up the airways and help you breathe. They can be taken with an inhaler or a nebulizer device.  Corticosteroid medicines. These may be given for severe bronchospasm, usually when it is associated with asthma.  Avoiding triggers, such as irritants, infection, or allergies.  Follow these instructions at home: Medicines  Take over-the-counter and prescription medicines only as told by your health care provider.  If you  need to use an inhaler or nebulizer to take your medicine, ask your health care provider to explain how to use it correctly. If you were given a spacer, always use it with your inhaler. Lifestyle  Reduce the number of triggers in your home. To do this: ? Change your heating and air conditioning filter at least once a month. ? Limit your use of fireplaces and wood stoves. ? Do not smoke. Do not allow smoking in your home. ? Avoid using perfumes and fragrances. ? Get rid of pests, such as roaches and mice, and their droppings. ? Remove any mold from your home. ? Keep your house clean and dust free. Use unscented cleaning products. ? Replace carpet with wood, tile, or vinyl flooring. Carpet can trap dander and dust. ? Use allergy-proof pillows, mattress covers, and box spring covers. ? Wash bed sheets and blankets every week in hot water. Dry them in a dryer. ? Use blankets that are made of polyester or cotton. ? Wash your hands often. ? Do not allow pets in your bedroom.  Avoid breathing in cold air when you exercise. General instructions  Have a plan for seeking medical care. Know when to call your health care provider and local emergency services, and where to get emergency care.  Stay up to date on your immunizations.  When you have an episode of bronchospasm, stay calm. Try to relax and breathe more slowly.  If you have asthma, make sure you have an asthma action plan.  Keep all follow-up visits as told by your health care provider. This is important. Contact a health care  provider if:  You have muscle aches.  You have chest pain.  The mucus that you cough up (sputum) changes from clear or white to yellow, green, gray, or bloody.  You have a fever.  Your sputum gets thicker. Get help right away if:  Your wheezing and coughing get worse, even after you take your prescribed medicines.  It gets even harder to breathe.  You develop severe chest  pain. Summary  Bronchospasm is a tightening of the airways going into the lungs.  During an episode of bronchospasm, you may have a harder time breathing. You may cough and make a whistling sound when you breathe (wheeze).  Avoid exposure to triggers such as smoke, dust, mold, animal dander, and fragrances.  When you have an episode of bronchospasm, stay calm. Try to relax and breathe more slowly. This information is not intended to replace advice given to you by your health care provider. Make sure you discuss any questions you have with your health care provider. Document Released: 09/07/2003 Document Revised: 08/31/2016 Document Reviewed: 08/31/2016 Elsevier Interactive Patient Education  2017 [employer].

## 2017-09-22 NOTE — Progress Notes (Signed)
[name] [name] is a 57 y.o. female with the following history as recorded in EpicCare:  Patient Active Problem List   Diagnosis Date Noted  . Wheezing 09/10/2017  . LRTI (lower respiratory tract infection) 09/05/2017  . Vitamin D deficiency 06/15/2017  . LUQ cramping 04/21/2017  . Lumbar radiculopathy 04/21/2017  . Trigger index finger of left hand 04/21/2017  . Obesity (BMI 35.0-39.9 without comorbidity) 04/21/2017  . Herpes zoster without complication 35/32/9924  . Left hip pain 10/31/2016  . Pes planus 10/31/2016  . Posterior tibialis muscle dysfunction 10/31/2016  . Depression 09/03/2016  . Memory difficulties 09/03/2016  . Breast pain, right 08/24/2016  . Chronic fatigue 06/08/2016  . Palpitations 04/04/2016  . 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 Malignant neoplasm of upper-outer quadrant of right breast in female, 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[place]) 11/03/2015  . S/P mastectomy 05/13/2015  . Chemotherapy-induced neuropathy ([place]) 04/21/2015  . Genetic testing 12/11/2014  . Chemotherapy induced neutropenia ([place]) 11/26/2014  . Breast cancer of upper-outer quadrant of right female breast ([place]) 10/23/2014  . Anxiety 04/29/2012  . Allergic rhinitis 11/29/2009  . 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 CONSTIPATION, CHRONIC 08/17/2008  . Dyslipidemia 04/24/2008  . METABOLIC SYNDROME X 26/83/4196  . GERD 01/07/2008  . OBESITY, TRUNCAL 09/20/2007  . Essential hypertension 09/20/2007  . Multiple sclerosis ([place]) 09/18/2007    Current Outpatient Medications  Medication Sig Dispense Refill  . acetaminophen (TYLENOL) 500 MG tablet Take 1,000 mg by mouth 2 (two) times daily as needed (pain). Reported on 09/23/2015    . ALPRAZolam (XANAX) 0.5 MG tablet Take 1 tablet (0.5 mg total) by mouth 2 (two) times daily as needed for anxiety or sleep. 90 tablet 0  . b complex vitamins tablet Take 1 tablet by mouth daily.    . Cholecalciferol (VITAMIN D) 2000 units CAPS Take 1 capsule (2,000 Units total) by mouth daily.  30 capsule   . COPAXONE 40 MG/ML SOSY INJECT ONE SYRINGE (40 MG) SUBCUTANEOUSLY THREE TIMES PER WEEK AT LEAST 48 HOURS APART. ALLOW SYRINGE TO WARM TO ROOM TEMPERATURE FOR 20 MIN 36 Syringe 3  . ferrous gluconate (FERGON) 324 MG tablet Take 1 tablet (324 mg total) by mouth 2 (two) times daily with a meal. 180 tablet 3  . fluticasone (FLONASE) 50 MCG/ACT nasal spray USE 2 SPRAYS IN EACH       NOSTRIL DAILY 16 g 6  . gabapentin (NEURONTIN) 100 MG capsule 200 mg in morning and afternoon, 300 mg at night 630 capsule 1  . hydrochlorothiazide (HYDRODIURIL) 25 MG tablet Take 1 tablet (25 mg total) by mouth daily. -- Office visit needed for further refills 90 tablet 0  . HYDROcodone-homatropine (HYCODAN) 5-1.5 MG/5ML syrup Take 5 mLs by mouth every 8 (eight) hours as needed for cough. 120 mL 0  . KLOR-CON SPRINKLE 10 MEQ CR capsule TAKE 2 CAPSULES (20MEQ     TOTAL) TWICE DAILY 180 capsule 1  . linaclotide (LINZESS) 145 MCG CAPS capsule Take 1 capsule (145 mcg total) by mouth daily before breakfast. 90 capsule 1  . loratadine-pseudoephedrine (CLARITIN-D 12 HOUR) 5-120 MG tablet Take 1 tablet by mouth daily as needed (seasonal allergies). 30 tablet 3  . losartan (COZAAR) 50 MG tablet TAKE 1 TABLET TWICE A DAY 180 tablet 1  . metoprolol tartrate (LOPRESSOR) 25 MG tablet Take 1 tablet (25 mg total) by mouth 2 (two) times daily. 180 tablet 3  . pantoprazole (PROTONIX) 40 MG tablet Take 1 tablet (40 mg total) by mouth daily. 90 tablet 2  .  valACYclovir (VALTREX) 1000 MG tablet Take 1 tablet (1,000 mg total) by mouth 3 (three) times daily. 21 tablet 0  . benzonatate (TESSALON) 100 MG capsule Take 2 capsules (200 mg total) by mouth 3 (three) times daily as needed for cough. 20 capsule 0  . budesonide-formoterol (SYMBICORT) 160-4.5 MCG/ACT inhaler Inhale 2 puffs into the lungs 2 (two) times daily. 1 Inhaler 0   No current facility-administered medications for this visit.     Allergies: Ace inhibitors;  Clarithromycin; Contrave [naltrexone-bupropion hcl er]; and Levaquin [levofloxacin]  Past Medical History:  Diagnosis Date  . Allergic rhinitis due to other allergen   . Allergy   . Angioedema    ACEI  . Anxiety   . Breast cancer of upper-outer quadrant of right female breast ([place]) 10/23/2014   s/p r mastectomy, neoadj chemo completed 04/21/15; neg genetic testing  . Colon polyps 05/2014   adenomatous, colo q 5y  . CONSTIPATION, CHRONIC   . GERD   . Hot flashes   . HYPERLIPIDEMIA   . HYPERTENSION   . Lactose intolerance   . Migraine headache   . Multiple sclerosis ([place])   . OBESITY, TRUNCAL     Past Surgical History:  Procedure Laterality Date  . BREAST BIOPSY Right    [place]   . COLONOSCOPY    . MASTECTOMY Right   . PORT-A-CATH REMOVAL Left 05/13/2015   Procedure: REMOVAL PORT-A-CATH;  Surgeon: [name], MD;  Location: [place];  Service: General;  Laterality: Left;  . PORTACATH PLACEMENT Left 11/06/2014   Procedure: INSERTION PORT-A-CATH;  Surgeon: [name], MD;  Location: [place];  Service: General;  Laterality: Left;  . SIMPLE MASTECTOMY WITH AXILLARY SENTINEL NODE BIOPSY Right 05/13/2015   Procedure: RIGHT TOTAL  MASTECTOMY WITH RIGHT  AXILLARY SENTINEL NODE BIOPSY;  Surgeon: [name], MD;  Location: [place];  Service: General;  Laterality: Right;  . TUBAL LIGATION      Family History  Problem Relation Age of Onset  . Coronary artery disease Mother   . Heart disease Mother   . Diabetes Mother   . Hypertension Mother   . Stroke Mother   . Alcohol abuse Father   . COPD Maternal Grandmother   . Hypertension Other   . Hyperlipidemia Other   . Diabetes Other   . Colon cancer Neg Hx     Social History   Tobacco Use  . Smoking status: Never Smoker  . Smokeless tobacco: Never Used  Substance Use Topics  . Alcohol use: No    Alcohol/week: 0.0 oz    Subjective:  Patient presents with concerns for persisting cough; originally seen on  12/19 and started on Doxycycline; returned on 12/24 and changed to [facility] and oral steroids; has also had 2 different Duo-neb treatments with relief; no fever; still coughing- "whitish." No CXR has been done; no history of asthma/ allergy prior; notes that just cannot seem to shake the cough.   Objective:  Vitals:   09/22/17 1100  BP: 126/90  Pulse: 82  Temp: 98.3 F (36.8 C)  TempSrc: Oral  SpO2: 96%  Weight: 215 lb (97.5 kg)  Height: 5\' 5"  (1.651 m)    General: Well developed, well nourished, in no acute distress  Skin : Warm and dry.  Head: Normocephalic and atraumatic  Eyes: Sclera and conjunctiva clear; pupils round and reactive to light; extraocular movements intact  Ears: External normal; canals clear; tympanic membranes normal  Oropharynx: Pink, supple. No suspicious lesions  Neck:  Supple without thyromegaly, adenopathy  Lungs: Respirations unlabored; clear to auscultation bilaterally without wheeze, rales, rhonchi  CVS exam: normal rate and regular rhythm.  Vessels: Symmetric bilaterally  Neurologic: Alert and oriented; speech intact; face symmetrical; moves all extremities well; CNII-XII intact without focal deficit   Assessment:  1. Acute bronchospasm   2. Persistent cough for 3 weeks or longer     Plan:  Duo-neb given in office today with relief; low suspicion for continued infection- do not feel another antibiotic needed; Will have patient use her Flonase regularly and add Symbicort and Tessalon Perles; increase fluids, rest; encouraged to follow-up for CXR due to length of time cough has been present; follow-up with her PCP if symptoms persist.   No Follow-up on file.  Orders Placed This Encounter  Procedures  . DG Chest 2 View    Standing Status:   Future    Standing Expiration Date:   11/21/2018    Order Specific Question:   Reason for Exam (SYMPTOM  OR DIAGNOSIS REQUIRED)    Answer:   cough    Order Specific Question:   Is patient pregnant?    Answer:   No     Order Specific Question:   Preferred imaging location?    Answer:   [facility]    Order Specific Question:   Radiology Contrast Protocol - do NOT remove file path    Answer:   [email or web address]    Requested Prescriptions   Signed Prescriptions Disp Refills  . budesonide-formoterol (SYMBICORT) 160-4.5 MCG/ACT inhaler 1 Inhaler 0    Sig: Inhale 2 puffs into the lungs 2 (two) times daily.  . benzonatate (TESSALON) 100 MG capsule 20 capsule 0    Sig: Take 2 capsules (200 mg total) by mouth 3 (three) times daily as needed for cough.

## 2017-09-28 ENCOUNTER — Ambulatory Visit (INDEPENDENT_AMBULATORY_CARE_PROVIDER_SITE_OTHER)
Admission: RE | Admit: 2017-09-28 | Discharge: 2017-09-28 | Disposition: A | Discharge status: Home / Self Care | Payer: BC Managed Care – PPO | Source: Ambulatory Visit | Attending: Family | Admitting: Family

## 2017-09-28 ENCOUNTER — Telehealth: Payer: Self-pay | Admitting: Internal Medicine

## 2017-09-28 DIAGNOSIS — R05 Cough: Secondary | ICD-10-CM | POA: Diagnosis not present

## 2017-09-28 DIAGNOSIS — C50411 Malignant neoplasm of upper-outer quadrant of right female breast: Secondary | ICD-10-CM

## 2017-09-28 DIAGNOSIS — R053 Chronic cough: Secondary | ICD-10-CM

## 2017-09-28 MED ORDER — GABAPENTIN 100 MG PO CAPS: ORAL_CAPSULE | ORAL | 3 refills | Status: DC

## 2017-09-28 NOTE — Telephone Encounter (Signed)
Patient requesting refill on gabapentin 100mg  to be sent to [facility] on [address].

## 2017-09-28 NOTE — Telephone Encounter (Signed)
Reviewed chart pt is up-to-date sent refills to [facility]../lmb  

## 2017-10-07 NOTE — Progress Notes (Signed)
Subjective:    Patient ID: [name] [name], female    DOB: 1960-09-29, 57 y.o.   MRN: [id number]  HPI She is here for a physical exam.   She does have intermittent sinus symptoms including right ear pressure and pain at times and sinus pressure.  She has intermittent congestion.  She denies any fevers, chills, cough or wheezing.  She has no other concerns.  Medications and allergies reviewed with patient and updated if appropriate.  Patient Active Problem List   Diagnosis Date Noted  . Vitamin D deficiency 06/15/2017  . LUQ cramping 04/21/2017  . Lumbar radiculopathy 04/21/2017  . Trigger index finger of left hand 04/21/2017  . Obesity (BMI 35.0-39.9 without comorbidity) 04/21/2017  . Herpes zoster without complication 28/31/5176  . Left hip pain 10/31/2016  . Pes planus 10/31/2016  . Posterior tibialis muscle dysfunction 10/31/2016  . Depression 09/03/2016  . Memory difficulties 09/03/2016  . Breast pain, right 08/24/2016  . Chronic fatigue 06/08/2016  . Palpitations 04/04/2016  . 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 Malignant neoplasm of upper-outer quadrant of right breast in female, 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[place]) 11/03/2015  . S/P mastectomy 05/13/2015  . Chemotherapy-induced neuropathy ([place]) 04/21/2015  . Genetic testing 12/11/2014  . Breast cancer of upper-outer quadrant of right female breast ([place]) 10/23/2014  . Anxiety 04/29/2012  . Allergic rhinitis 11/29/2009  . 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 CONSTIPATION, CHRONIC 08/17/2008  . Dyslipidemia 04/24/2008  . METABOLIC SYNDROME X 16/03/3709  . GERD 01/07/2008  . Essential hypertension 09/20/2007  . Multiple sclerosis ([place]) 09/18/2007    Current Outpatient Medications on File Prior to Visit  Medication Sig Dispense Refill  . acetaminophen (TYLENOL) 500 MG tablet Take 1,000 mg by mouth 2 (two) times daily as needed (pain). Reported on 09/23/2015    . ALPRAZolam (XANAX) 0.5 MG tablet Take 1 tablet (0.5 mg total) by mouth 2  (two) times daily as needed for anxiety or sleep. 90 tablet 0  . b complex vitamins tablet Take 1 tablet by mouth daily.    . budesonide-formoterol (SYMBICORT) 160-4.5 MCG/ACT inhaler Inhale 2 puffs into the lungs 2 (two) times daily. 1 Inhaler 0  . Cholecalciferol (VITAMIN D) 2000 units CAPS Take 1 capsule (2,000 Units total) by mouth daily. 30 capsule   . COPAXONE 40 MG/ML SOSY INJECT ONE SYRINGE (40 MG) SUBCUTANEOUSLY THREE TIMES PER WEEK AT LEAST 48 HOURS APART. ALLOW SYRINGE TO WARM TO ROOM TEMPERATURE FOR 20 MIN 36 Syringe 3  . ferrous gluconate (FERGON) 324 MG tablet Take 1 tablet (324 mg total) by mouth 2 (two) times daily with a meal. 180 tablet 3  . fluticasone (FLONASE) 50 MCG/ACT nasal spray USE 2 SPRAYS IN EACH       NOSTRIL DAILY 16 g 6  . gabapentin (NEURONTIN) 100 MG capsule 200 mg in morning and afternoon, 300 mg at night 150 capsule 3  . hydrochlorothiazide (HYDRODIURIL) 25 MG tablet Take 1 tablet (25 mg total) by mouth daily. -- Office visit needed for further refills 90 tablet 0  . KLOR-CON SPRINKLE 10 MEQ CR capsule TAKE 2 CAPSULES (20MEQ     TOTAL) TWICE DAILY 180 capsule 1  . linaclotide (LINZESS) 145 MCG CAPS capsule Take 1 capsule (145 mcg total) by mouth daily before breakfast. 90 capsule 1  . loratadine-pseudoephedrine (CLARITIN-D 12 HOUR) 5-120 MG tablet Take 1 tablet by mouth daily as needed (seasonal allergies). 30 tablet 3  . losartan (COZAAR) 50 MG tablet TAKE 1 TABLET TWICE A DAY 180  tablet 1  . metoprolol tartrate (LOPRESSOR) 25 MG tablet Take 1 tablet (25 mg total) by mouth 2 (two) times daily. 180 tablet 3  . pantoprazole (PROTONIX) 40 MG tablet Take 1 tablet (40 mg total) by mouth daily. 90 tablet 2  . valACYclovir (VALTREX) 1000 MG tablet Take 1 tablet (1,000 mg total) by mouth 3 (three) times daily. 21 tablet 0  . [DISCONTINUED] mometasone (NASONEX) 50 MCG/ACT nasal spray Place 2 sprays into the nose daily. 17 g 2   No current facility-administered medications  on file prior to visit.     Past Medical History:  Diagnosis Date  . Allergic rhinitis due to other allergen   . Allergy   . Angioedema    ACEI  . Anxiety   . Breast cancer of upper-outer quadrant of right female breast ([place]) 10/23/2014   s/p r mastectomy, neoadj chemo completed 04/21/15; neg genetic testing  . Colon polyps 05/2014   adenomatous, colo q 5y  . CONSTIPATION, CHRONIC   . GERD   . Hot flashes   . HYPERLIPIDEMIA   . HYPERTENSION   . Lactose intolerance   . Migraine headache   . Multiple sclerosis ([place])   . OBESITY, TRUNCAL     Past Surgical History:  Procedure Laterality Date  . BREAST BIOPSY Right    [place]   . COLONOSCOPY    . MASTECTOMY Right   . PORT-A-CATH REMOVAL Left 05/13/2015   Procedure: REMOVAL PORT-A-CATH;  Surgeon: [name], MD;  Location: [facility];  Service: General;  Laterality: Left;  . PORTACATH PLACEMENT Left 11/06/2014   Procedure: INSERTION PORT-A-CATH;  Surgeon: [name], MD;  Location: [facility];  Service: General;  Laterality: Left;  . SIMPLE MASTECTOMY WITH AXILLARY SENTINEL NODE BIOPSY Right 05/13/2015   Procedure: RIGHT TOTAL  MASTECTOMY WITH RIGHT  AXILLARY SENTINEL NODE BIOPSY;  Surgeon: [name], MD;  Location: [facility];  Service: General;  Laterality: Right;  . TUBAL LIGATION      Social History   Socioeconomic History  . Marital status: Single    Spouse name: None  . Number of children: 2  . Years of education: 76  . Highest education level: None  Social Needs  . Financial resource strain: None  . Food insecurity - worry: None  . Food insecurity - inability: None  . Transportation needs - medical: None  . Transportation needs - non-medical: None  Occupational History  . Occupation: Solicitor: [employer]  Tobacco Use  . Smoking status: Never Smoker  . Smokeless tobacco: Never Used  Substance and Sexual Activity  . Alcohol use: No    Alcohol/week: 0.0 oz  . Drug  use: No  . Sexual activity: None  Other Topics Concern  . 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    Patient is single and lives alone.    Patient is right handed.     Family History  Problem Relation Age of Onset  . Coronary artery disease Mother   . Heart disease Mother   . Diabetes Mother   . Hypertension Mother   . Stroke Mother   . Alcohol abuse Father   . COPD Maternal Grandmother   . Hypertension Other   . Hyperlipidemia Other   . Diabetes Other   . Colon cancer Neg Hx     Review of Systems  Constitutional: Negative for  chills and fever.  Eyes: Negative for visual disturbance.  Respiratory: Negative for cough, shortness of breath and wheezing.   Cardiovascular: Positive for leg swelling (mild). Negative for chest pain and palpitations.  Gastrointestinal: Positive for constipation (controlled). Negative for abdominal pain, blood in stool, diarrhea and nausea.  Genitourinary: Negative for dysuria and hematuria.  Musculoskeletal: Positive for arthralgias and back pain (lower back with walking a lot).  Skin: Negative for color change and rash.  Neurological: Negative for light-headedness and headaches.  Psychiatric/Behavioral: Negative for dysphoric mood. The patient is nervous/anxious.        Objective:   Vitals:   10/09/17 0921  BP: 134/86  Pulse: 89  Resp: 16  Temp: 98.1 F (36.7 C)  SpO2: 98%   Filed Weights   10/09/17 0921  Weight: 216 lb (98 kg)   Body mass index is 35.94 kg/m.  Wt Readings from Last 3 Encounters:  10/09/17 216 lb (98 kg)  09/22/17 215 lb (97.5 kg)  06/25/17 217 lb 6.4 oz (98.6 kg)     Physical Exam Constitutional: She appears well-developed and well-nourished. No distress.  HENT:  Head: Normocephalic and atraumatic.  Right Ear: External ear normal. Normal ear canal and TM Left Ear: External ear normal.  Normal ear canal and  TM Mouth/Throat: Oropharynx is clear and moist.  Eyes: Conjunctivae and EOM are normal.  Neck: Neck supple. No tracheal deviation present. No thyromegaly present.  No carotid bruit  Cardiovascular: Normal rate, regular rhythm and normal heart sounds.   No murmur heard.  No edema. Pulmonary/Chest: Effort normal and breath sounds normal. No respiratory distress. She has no wheezes. She has no rales.  Breast: deferred to Gyn Abdominal: Soft. She exhibits no distension. There is no tenderness.  Lymphadenopathy: She has no cervical adenopathy.  Skin: Skin is warm and dry. She is not diaphoretic.  Psychiatric: She has a normal mood and affect. Her behavior is normal.        Assessment & Plan:   Physical exam: Screening blood work   ordered Immunizations    had shingles vaccine, others up-to-date Colonoscopy  Up to date  Mammogram   Up to date  Gyn  Up to date, last pap 10/28/2013,  Up to date  Eye exams   Up to date   EKG  Done 02/2017 Exercise  Walks a lot during the day, no regimented exercise Weight  Advised weight loss Skin    no concerns Substance abuse   none  See Problem List for Assessment and Plan of chronic medical problems.   Follow-up in 6 months

## 2017-10-07 NOTE — Patient Instructions (Addendum)
Test(s) ordered today. Your results will be released to [facility] (or called to you) after review, usually within 72hours after test completion. If any changes need to be made, you will be notified at that same time.  All other Health Maintenance issues reviewed.   All recommended immunizations and age-appropriate screenings are up-to-date or discussed.  No immunizations administered today.   Medications reviewed and updated.  No changes recommended at this time.  Your prescription(s) have been submitted to your pharmacy. Please take as directed and contact our office if you believe you are having problem(s) with the medication(s).   Please followup in 6 months   Health Maintenance, Female Adopting a healthy lifestyle and getting preventive care can go a long way to promote health and wellness. Talk with your health care provider about what schedule of regular examinations is right for you. This is a good chance for you to check in with your provider about disease prevention and staying healthy. In between checkups, there are plenty of things you can do on your own. Experts have done a lot of research about which lifestyle changes and preventive measures are most likely to keep you healthy. Ask your health care provider for more information. Weight and diet Eat a healthy diet  Be sure to include plenty of vegetables, fruits, low-fat dairy products, and lean protein.  Do not eat a lot of foods high in solid fats, added sugars, or salt.  Get regular exercise. This is one of the most important things you can do for your health. ? Most adults should exercise for at least 150 minutes each week. The exercise should increase your heart rate and make you sweat (moderate-intensity exercise). ? Most adults should also do strengthening exercises at least twice a week. This is in addition to the moderate-intensity exercise.  Maintain a healthy weight  Body mass index (BMI) is a measurement that can  be used to identify possible weight problems. It estimates body fat based on height and weight. Your health care provider can help determine your BMI and help you achieve or maintain a healthy weight.  For females 13 years of age and older: ? A BMI below 18.5 is considered underweight. ? A BMI of 18.5 to 24.9 is normal. ? A BMI of 25 to 29.9 is considered overweight. ? A BMI of 30 and above is considered obese.  Watch levels of cholesterol and blood lipids  You should start having your blood tested for lipids and cholesterol at 56 years of age, then have this test every 5 years.  You may need to have your cholesterol levels checked more often if: ? Your lipid or cholesterol levels are high. ? You are older than 55 years of age. ? You are at high risk for heart disease.  Cancer screening Lung Cancer  Lung cancer screening is recommended for adults 54-15 years old who are at high risk for lung cancer because of a history of smoking.  A yearly low-dose CT scan of the lungs is recommended for people who: ? Currently smoke. ? Have quit within the past 15 years. ? Have at least a 30-pack-year history of smoking. A pack year is smoking an average of one pack of cigarettes a day for 1 year.  Yearly screening should continue until it has been 15 years since you quit.  Yearly screening should stop if you develop a health problem that would prevent you from having lung cancer treatment.  Breast Cancer  Practice breast self-awareness.  This means understanding how your breasts normally appear and feel.  It also means doing regular breast self-exams. Let your health care provider know about any changes, no matter how small.  If you are in your 20s or 30s, you should have a clinical breast exam (CBE) by a health care provider every 1-3 years as part of a regular health exam.  If you are 29 or older, have a CBE every year. Also consider having a breast X-ray (mammogram) every year.  If you  have a family history of breast cancer, talk to your health care provider about genetic screening.  If you are at high risk for breast cancer, talk to your health care provider about having an MRI and a mammogram every year.  Breast cancer gene (BRCA) assessment is recommended for women who have family members with BRCA-related cancers. BRCA-related cancers include: ? Breast. ? Ovarian. ? Tubal. ? Peritoneal cancers.  Results of the assessment will determine the need for genetic counseling and BRCA1 and BRCA2 testing.  Cervical Cancer Your health care provider may recommend that you be screened regularly for cancer of the pelvic organs (ovaries, uterus, and vagina). This screening involves a pelvic examination, including checking for microscopic changes to the surface of your cervix (Pap test). You may be encouraged to have this screening done every 3 years, beginning at age 40.  For women ages 44-65, health care providers may recommend pelvic exams and Pap testing every 3 years, or they may recommend the Pap and pelvic exam, combined with testing for human papilloma virus (HPV), every 5 years. Some types of HPV increase your risk of cervical cancer. Testing for HPV may also be done on women of any age with unclear Pap test results.  Other health care providers may not recommend any screening for nonpregnant women who are considered low risk for pelvic cancer and who do not have symptoms. Ask your health care provider if a screening pelvic exam is right for you.  If you have had past treatment for cervical cancer or a condition that could lead to cancer, you need Pap tests and screening for cancer for at least 20 years after your treatment. If Pap tests have been discontinued, your risk factors (such as having a new sexual partner) need to be reassessed to determine if screening should resume. Some women have medical problems that increase the chance of getting cervical cancer. In these cases,  your health care provider may recommend more frequent screening and Pap tests.  Colorectal Cancer  This type of cancer can be detected and often prevented.  Routine colorectal cancer screening usually begins at 56 years of age and continues through 56 years of age.  Your health care provider may recommend screening at an earlier age if you have risk factors for colon cancer.  Your health care provider may also recommend using home test kits to check for hidden blood in the stool.  A small camera at the end of a tube can be used to examine your colon directly (sigmoidoscopy or colonoscopy). This is done to check for the earliest forms of colorectal cancer.  Routine screening usually begins at age 96.  Direct examination of the colon should be repeated every 5-10 years through 56 years of age. However, you may need to be screened more often if early forms of precancerous polyps or small growths are found.  Skin Cancer  Check your skin from head to toe regularly.  Tell your health care provider about any  new moles or changes in moles, especially if there is a change in a mole's shape or color.  Also tell your health care provider if you have a mole that is larger than the size of a pencil eraser.  Always use sunscreen. Apply sunscreen liberally and repeatedly throughout the day.  Protect yourself by wearing long sleeves, pants, a wide-brimmed hat, and sunglasses whenever you are outside.  Heart disease, diabetes, and high blood pressure  High blood pressure causes heart disease and increases the risk of stroke. High blood pressure is more likely to develop in: ? People who have blood pressure in the high end of the normal range (130-139/85-89 mm Hg). ? People who are overweight or obese. ? People who are African American.  If you are 58-1 years of age, have your blood pressure checked every 3-5 years. If you are 52 years of age or older, have your blood pressure checked every year.  You should have your blood pressure measured twice-once when you are at a hospital or clinic, and once when you are not at a hospital or clinic. Record the average of the two measurements. To check your blood pressure when you are not at a hospital or clinic, you can use: ? An automated blood pressure machine at a pharmacy. ? A home blood pressure monitor.  If you are between 24 years and 65 years old, ask your health care provider if you should take aspirin to prevent strokes.  Have regular diabetes screenings. This involves taking a blood sample to check your fasting blood sugar level. ? If you are at a normal weight and have a low risk for diabetes, have this test once every three years after 56 years of age. ? If you are overweight and have a high risk for diabetes, consider being tested at a younger age or more often. Preventing infection Hepatitis B  If you have a higher risk for hepatitis B, you should be screened for this virus. You are considered at high risk for hepatitis B if: ? You were born in a country where hepatitis B is common. Ask your health care provider which countries are considered high risk. ? Your parents were born in a high-risk country, and you have not been immunized against hepatitis B (hepatitis B vaccine). ? You have HIV or AIDS. ? You use needles to inject street drugs. ? You live with someone who has hepatitis B. ? You have had sex with someone who has hepatitis B. ? You get hemodialysis treatment. ? You take certain medicines for conditions, including cancer, organ transplantation, and autoimmune conditions.  Hepatitis C  Blood testing is recommended for: ? Everyone born from [birth year] through 1964. ? Anyone with known risk factors for hepatitis C.  Sexually transmitted infections (STIs)  You should be screened for sexually transmitted infections (STIs) including gonorrhea and chlamydia if: ? You are sexually active and are younger than 56 years of  age. ? You are older than 56 years of age and your health care provider tells you that you are at risk for this type of infection. ? Your sexual activity has changed since you were last screened and you are at an increased risk for chlamydia or gonorrhea. Ask your health care provider if you are at risk.  If you do not have HIV, but are at risk, it may be recommended that you take a prescription medicine daily to prevent HIV infection. This is called pre-exposure prophylaxis (PrEP). You are considered at  risk if: ? You are sexually active and do not regularly use condoms or know the HIV status of your partner(s). ? You take drugs by injection. ? You are sexually active with a partner who has HIV.  Talk with your health care provider about whether you are at high risk of being infected with HIV. If you choose to begin PrEP, you should first be tested for HIV. You should then be tested every 3 months for as long as you are taking PrEP. Pregnancy  If you are premenopausal and you may become pregnant, ask your health care provider about preconception counseling.  If you may become pregnant, take 400 to 800 micrograms (mcg) of folic acid every day.  If you want to prevent pregnancy, talk to your health care provider about birth control (contraception). Osteoporosis and menopause  Osteoporosis is a disease in which the bones lose minerals and strength with aging. This can result in serious bone fractures. Your risk for osteoporosis can be identified using a bone density scan.  If you are 6 years of age or older, or if you are at risk for osteoporosis and fractures, ask your health care provider if you should be screened.  Ask your health care provider whether you should take a calcium or vitamin D supplement to lower your risk for osteoporosis.  Menopause may have certain physical symptoms and risks.  Hormone replacement therapy may reduce some of these symptoms and risks. Talk to your health  care provider about whether hormone replacement therapy is right for you. Follow these instructions at home:  Schedule regular health, dental, and eye exams.  Stay current with your immunizations.  Do not use any tobacco products including cigarettes, chewing tobacco, or electronic cigarettes.  If you are pregnant, do not drink alcohol.  If you are breastfeeding, limit how much and how often you drink alcohol.  Limit alcohol intake to no more than 1 drink per day for nonpregnant women. One drink equals 12 ounces of beer, 5 ounces of wine, or 1 ounces of hard liquor.  Do not use street drugs.  Do not share needles.  Ask your health care provider for help if you need support or information about quitting drugs.  Tell your health care provider if you often feel depressed.  Tell your health care provider if you have ever been abused or do not feel safe at home. This information is not intended to replace advice given to you by your health care provider. Make sure you discuss any questions you have with your health care provider. Document Released: 03/20/2011 Document Revised: 02/10/2016 Document Reviewed: 06/08/2015 Elsevier Interactive Patient Education  [employer].

## 2017-10-09 ENCOUNTER — Other Ambulatory Visit (INDEPENDENT_AMBULATORY_CARE_PROVIDER_SITE_OTHER): Payer: BC Managed Care – PPO

## 2017-10-09 ENCOUNTER — Encounter: Payer: Self-pay | Admitting: Internal Medicine

## 2017-10-09 ENCOUNTER — Ambulatory Visit (INDEPENDENT_AMBULATORY_CARE_PROVIDER_SITE_OTHER): Payer: BC Managed Care – PPO | Admitting: Internal Medicine

## 2017-10-09 VITALS — BP 134/86 | HR 89 | Temp 98.1°F | Resp 16 | Ht 65.0 in | Wt 216.0 lb

## 2017-10-09 DIAGNOSIS — E785 Hyperlipidemia, unspecified: Secondary | ICD-10-CM

## 2017-10-09 DIAGNOSIS — I10 Essential (primary) hypertension: Secondary | ICD-10-CM | POA: Diagnosis not present

## 2017-10-09 DIAGNOSIS — E611 Iron deficiency: Secondary | ICD-10-CM

## 2017-10-09 DIAGNOSIS — F419 Anxiety disorder, unspecified: Secondary | ICD-10-CM

## 2017-10-09 DIAGNOSIS — N644 Mastodynia: Secondary | ICD-10-CM | POA: Diagnosis not present

## 2017-10-09 DIAGNOSIS — K219 Gastro-esophageal reflux disease without esophagitis: Secondary | ICD-10-CM | POA: Diagnosis not present

## 2017-10-09 DIAGNOSIS — K5909 Other constipation: Secondary | ICD-10-CM

## 2017-10-09 DIAGNOSIS — E669 Obesity, unspecified: Secondary | ICD-10-CM

## 2017-10-09 DIAGNOSIS — Z0000 Encounter for general adult medical examination without abnormal findings: Secondary | ICD-10-CM

## 2017-10-09 LAB — COMPREHENSIVE METABOLIC PANEL
ALBUMIN: 4.3 g/dL (ref 3.5–5.2)
ALT: 17 U/L (ref 0–35)
AST: 17 U/L (ref 0–37)
Alkaline Phosphatase: 83 U/L (ref 39–117)
BILIRUBIN TOTAL: 0.5 mg/dL (ref 0.2–1.2)
BUN: 15 mg/dL (ref 6–23)
CALCIUM: 9.4 mg/dL (ref 8.4–10.5)
CO2: 28 mEq/L (ref 19–32)
CREATININE: 0.83 mg/dL (ref 0.40–1.20)
Chloride: 103 mEq/L (ref 96–112)
GFR: 91.66 mL/min (ref >60.00)
Glucose, Bld: 90 mg/dL (ref 70–99)
Potassium: 3.7 mEq/L (ref 3.5–5.1)
SODIUM: 139 meq/L (ref 135–145)
TOTAL PROTEIN: 6.7 g/dL (ref 6.0–8.3)

## 2017-10-09 LAB — LIPID PANEL
CHOLESTEROL: 187 mg/dL (ref 0–200)
HDL: 44.3 mg/dL (ref >39.00)
NonHDL: 142.77
Total CHOL/HDL Ratio: 4
Triglycerides: 206 mg/dL — ABNORMAL HIGH (ref 0.0–149.0)
VLDL: 41.2 mg/dL — ABNORMAL HIGH (ref 0.0–40.0)

## 2017-10-09 LAB — CBC WITH DIFFERENTIAL/PLATELET
BASOS ABS: 0 10*3/uL (ref 0.0–0.1)
Basophils Relative: 0.2 % (ref 0.0–3.0)
EOS ABS: 0.2 10*3/uL (ref 0.0–0.7)
Eosinophils Relative: 3.6 % (ref 0.0–5.0)
HEMATOCRIT: 36 % (ref 36.0–46.0)
HEMOGLOBIN: 12.2 g/dL (ref 12.0–15.0)
LYMPHS PCT: 40.4 % (ref 12.0–46.0)
Lymphs Abs: 2.7 10*3/uL (ref 0.7–4.0)
MCHC: 33.8 g/dL (ref 30.0–36.0)
MCV: 87.3 fl (ref 78.0–100.0)
MONO ABS: 0.5 10*3/uL (ref 0.1–1.0)
Monocytes Relative: 7.5 % (ref 3.0–12.0)
Neutro Abs: 3.3 10*3/uL (ref 1.4–7.7)
Neutrophils Relative %: 48.3 % (ref 43.0–77.0)
PLATELETS: 243 10*3/uL (ref 150.0–400.0)
RBC: 4.12 Mil/uL (ref 3.87–5.11)
RDW: 16.2 % — AB (ref 11.5–15.5)
WBC: 6.8 10*3/uL (ref 4.0–10.5)

## 2017-10-09 LAB — TSH: TSH: 2.42 u[IU]/mL (ref 0.35–4.50)

## 2017-10-09 LAB — FERRITIN: FERRITIN: 401.1 ng/mL — AB (ref 10.0–291.0)

## 2017-10-09 LAB — LDL CHOLESTEROL, DIRECT: LDL DIRECT: 119 mg/dL

## 2017-10-09 LAB — IRON: Iron: 89 ug/dL (ref 42–145)

## 2017-10-09 MED ORDER — LOSARTAN POTASSIUM 50 MG PO TABS: 50.0000 mg | ORAL_TABLET | Freq: Two times a day (BID) | ORAL | 3 refills | Status: DC

## 2017-10-09 MED ORDER — IBUPROFEN 800 MG PO TABS: 800.0000 mg | ORAL_TABLET | Freq: Three times a day (TID) | ORAL | 1 refills | Status: DC | PRN

## 2017-10-09 MED ORDER — HYDROCHLOROTHIAZIDE 25 MG PO TABS: 25.0000 mg | ORAL_TABLET | Freq: Every day | ORAL | 3 refills | Status: DC

## 2017-10-09 MED ORDER — METOPROLOL TARTRATE 25 MG PO TABS: 25.0000 mg | ORAL_TABLET | Freq: Two times a day (BID) | ORAL | 3 refills | Status: DC

## 2017-10-09 MED ORDER — AZITHROMYCIN 250 MG PO TABS: ORAL_TABLET | ORAL | 0 refills | Status: DC

## 2017-10-09 NOTE — Assessment & Plan Note (Signed)
Controlled Discussed taking the medication every other day-if heartburn still controlled we will consider decreasing to 20 mg

## 2017-10-09 NOTE — Assessment & Plan Note (Addendum)
Intermittent anxiety Continue Xanax only as needed

## 2017-10-09 NOTE — Assessment & Plan Note (Signed)
BP well controlled Current regimen effective and well tolerated Continue current medications at current doses Cmp, tsh, cbc 

## 2017-10-09 NOTE — Assessment & Plan Note (Signed)
Encouraged weight loss Stressed importance of regular exercise Decrease portions, healthy diet

## 2017-10-09 NOTE — Assessment & Plan Note (Signed)
Taking iron pills daily We will check ferritin, iron and CBC

## 2017-10-09 NOTE — Assessment & Plan Note (Addendum)
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

## 2017-10-11 ENCOUNTER — Encounter: Payer: Self-pay | Admitting: Internal Medicine

## 2017-10-12 ENCOUNTER — Telehealth: Payer: Self-pay | Admitting: *Deleted

## 2017-10-12 NOTE — Telephone Encounter (Signed)
Rec'd call from patient concern ablout lab results from 10/10/17. She is requesting MD to give her a call back. Wanting to know what she can do about her triglycerides being elevated. ? About the Ferritin how its double the amount it supposed to be. Pt starting to panic about labs inform her not to worry will send MD msg../lmb

## 2017-10-12 NOTE — Telephone Encounter (Signed)
She can stop the iron for now or decrease her dose to 1 pill three times a week - we will recheck the levels in several months to see how they are.    The triglycerides will likely improve with healthy diet, exercise and weight loss. If they do not improve we need to consider medication, but I would like to avoid that.

## 2017-10-12 NOTE — Telephone Encounter (Signed)
Notified pt w/MD response.../lmb 

## 2017-10-25 ENCOUNTER — Other Ambulatory Visit: Payer: Self-pay | Admitting: Nurse Practitioner

## 2017-10-31 ENCOUNTER — Ambulatory Visit
Admission: RE | Admit: 2017-10-31 | Discharge: 2017-10-31 | Disposition: A | Discharge status: Home / Self Care | Payer: BC Managed Care – PPO | Source: Ambulatory Visit | Attending: Adult Health | Admitting: Adult Health

## 2017-10-31 DIAGNOSIS — Z1239 Encounter for other screening for malignant neoplasm of breast: Secondary | ICD-10-CM

## 2017-11-04 ENCOUNTER — Other Ambulatory Visit: Payer: Self-pay | Admitting: Internal Medicine

## 2017-11-04 DIAGNOSIS — C50411 Malignant neoplasm of upper-outer quadrant of right female breast: Secondary | ICD-10-CM

## 2017-11-05 ENCOUNTER — Ambulatory Visit: Payer: BC Managed Care – PPO | Admitting: Internal Medicine

## 2017-11-05 ENCOUNTER — Encounter: Payer: Self-pay | Admitting: Internal Medicine

## 2017-11-05 VITALS — BP 132/78 | HR 85 | Temp 97.9°F | Resp 16 | Wt 214.0 lb

## 2017-11-05 DIAGNOSIS — G5711 Meralgia paresthetica, right lower limb: Secondary | ICD-10-CM | POA: Insufficient documentation

## 2017-11-05 DIAGNOSIS — G35 Multiple sclerosis: Secondary | ICD-10-CM

## 2017-11-05 MED ORDER — GABAPENTIN 100 MG PO CAPS: 300.0000 mg | ORAL_CAPSULE | Freq: Three times a day (TID) | ORAL | 1 refills | Status: DC

## 2017-11-05 NOTE — Assessment & Plan Note (Signed)
Her numbness/tingling/burning sensation in her right upper thigh is consistent with meralgia paresthetica She is currently taking gabapentin, we can increase the dose since it does not make her drowsy Increase gabapentin to 300 mg 3 times daily.  If she does well with this and needs to increase further we can do that She had a couple of instances of mild tingling in her right upper thigh and she may be developing this on the left side as well-monitor and continue gabapentin

## 2017-11-05 NOTE — Progress Notes (Signed)
Subjective:    Patient ID: [name] [name], female    DOB: Aug 23, 1961, 57 y.o.   MRN: [id number]  HPI The patient is here for an acute visit.  She has an appointment with her neurologist, but she was somewhat concerned about having a flare of her multiple sclerosis.  Numbness in the right upper leg: She has had this for years and has not changed.  She has a numbness, tingling and burning sensation in her right upper leg on the anterior and lateral aspect of the leg.  It does not go below the knee and is not on the posterior medial aspect of the leg.  She has the burning/numbness sensation only when she is standing or walking.  More recently she has not started to notice an intermittent numbness in her left upper lateral thigh, but this is only happened a couple of times and has not been persistent.  She denies any weakness in the legs.  She is taking gabapentin 3 times daily-200 mg in the morning, 200 mg in the afternoon and 300 mg at bedtime.  The medication does not make her tired.  Yesterday at work she was sitting there and she felt some tingling in her left cheek.  It did not last long and went away and she has not had this since.  She was unsure if this was anything significant or not.  She has not had any focal weakness, blurry vision or fatigue.    Medications and allergies reviewed with patient and updated if appropriate.  Patient Active Problem List   Diagnosis Date Noted  . Iron deficiency 10/09/2017  . Vitamin D deficiency 06/15/2017  . LUQ cramping 04/21/2017  . Lumbar radiculopathy 04/21/2017  . Trigger index finger of left hand 04/21/2017  . Obesity (BMI 35.0-39.9 without comorbidity) 04/21/2017  . Left hip pain 10/31/2016  . Pes planus 10/31/2016  . Posterior tibialis muscle dysfunction 10/31/2016  . Depression 09/03/2016  . Memory difficulties 09/03/2016  . Breast pain, right 08/24/2016  . Chronic fatigue 06/08/2016  . Palpitations 04/04/2016  . Hand  arthritis 04/04/2016  . Malignant neoplasm of upper-outer quadrant of right breast in female, 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[place]) 11/03/2015  . S/P mastectomy 05/13/2015  . Chemotherapy-induced neuropathy ([place]) 04/21/2015  . Genetic testing 12/11/2014  . Breast cancer of upper-outer quadrant of right female breast ([place]) 10/23/2014  . Anxiety 04/29/2012  . Allergic rhinitis 11/29/2009  . 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 CONSTIPATION, CHRONIC 08/17/2008  . Dyslipidemia 04/24/2008  . METABOLIC SYNDROME X 09/73/5329  . GERD 01/07/2008  . Essential hypertension 09/20/2007  . Multiple sclerosis ([place]) 09/18/2007    Current Outpatient Medications on File Prior to Visit  Medication Sig Dispense Refill  . ALPRAZolam (XANAX) 0.5 MG tablet Take 1 tablet (0.5 mg total) by mouth 2 (two) times daily as needed for anxiety or sleep. 90 tablet 0  . b complex vitamins tablet Take 1 tablet by mouth daily.    . budesonide-formoterol (SYMBICORT) 160-4.5 MCG/ACT inhaler Inhale 2 puffs into the lungs 2 (two) times daily. 1 Inhaler 0  . Cholecalciferol (VITAMIN D) 2000 units CAPS Take 1 capsule (2,000 Units total) by mouth daily. 30 capsule   . COPAXONE 40 MG/ML SOSY INJECT ONE SYRINGE (40 MG) SUBCUTANEOUSLY THREE TIMES PER WEEK AT LEAST 48 HOURS APART. ALLOW SYRINGE TO WARM TO ROOM TEMPERATURE FOR 20 MIN 36 Syringe 1  . ferrous gluconate (FERGON) 324 MG tablet Take 1 tablet (324 mg total) by mouth 2 (two) times  daily with a meal. 180 tablet 3  . fluticasone (FLONASE) 50 MCG/ACT nasal spray USE 2 SPRAYS IN EACH       NOSTRIL DAILY 16 g 6  . hydrochlorothiazide (HYDRODIURIL) 25 MG tablet Take 1 tablet (25 mg total) by mouth daily. 90 tablet 3  . ibuprofen (ADVIL,MOTRIN) 800 MG tablet Take 1 tablet (800 mg total) by mouth every 8 (eight) hours as needed. 60 tablet 1  . KLOR-CON SPRINKLE 10 MEQ CR capsule TAKE 2 CAPSULES (20MEQ     TOTAL) TWICE DAILY 180 capsule 1  . linaclotide (LINZESS) 145 MCG CAPS capsule Take 1 capsule (145  mcg total) by mouth daily before breakfast. 90 capsule 1  . loratadine-pseudoephedrine (CLARITIN-D 12 HOUR) 5-120 MG tablet Take 1 tablet by mouth daily as needed (seasonal allergies). 30 tablet 3  . losartan (COZAAR) 50 MG tablet Take 1 tablet (50 mg total) by mouth 2 (two) times daily. 180 tablet 3  . metoprolol tartrate (LOPRESSOR) 25 MG tablet Take 1 tablet (25 mg total) by mouth 2 (two) times daily. 180 tablet 3  . pantoprazole (PROTONIX) 40 MG tablet Take 1 tablet (40 mg total) by mouth daily. 90 tablet 2  . gabapentin (NEURONTIN) 100 MG capsule TAKE 2 CAPSULES IN THE     MORNING, 2 CAPSULES AT NOONAND 3 CAPSULES AT  NIGHT ATBEDTIME 630 capsule 1  . [DISCONTINUED] mometasone (NASONEX) 50 MCG/ACT nasal spray Place 2 sprays into the nose daily. 17 g 2   No current facility-administered medications on file prior to visit.     Past Medical History:  Diagnosis Date  . Allergic rhinitis due to other allergen   . Allergy   . Angioedema    ACEI  . Anxiety   . Breast cancer of upper-outer quadrant of right female breast ([place]) 10/23/2014   s/p r mastectomy, neoadj chemo completed 04/21/15; neg genetic testing  . Colon polyps 05/2014   adenomatous, colo q 5y  . CONSTIPATION, CHRONIC   . GERD   . Herpes zoster without complication 04/28/9146  . Hot flashes   . HYPERLIPIDEMIA   . HYPERTENSION   . Lactose intolerance   . Migraine headache   . Multiple sclerosis ([place])   . OBESITY, TRUNCAL     Past Surgical History:  Procedure Laterality Date  . BREAST BIOPSY Right    [place]   . COLONOSCOPY    . MASTECTOMY Right   . PORT-A-CATH REMOVAL Left 05/13/2015   Procedure: REMOVAL PORT-A-CATH;  Surgeon: [name], MD;  Location: [facility];  Service: General;  Laterality: Left;  . PORTACATH PLACEMENT Left 11/06/2014   Procedure: INSERTION PORT-A-CATH;  Surgeon: [name], MD;  Location: [facility];  Service: General;  Laterality: Left;  . SIMPLE MASTECTOMY WITH AXILLARY  SENTINEL NODE BIOPSY Right 05/13/2015   Procedure: RIGHT TOTAL  MASTECTOMY WITH RIGHT  AXILLARY SENTINEL NODE BIOPSY;  Surgeon: [name], MD;  Location: [facility];  Service: General;  Laterality: Right;  . TUBAL LIGATION      Social History   Socioeconomic History  . Marital status: Single    Spouse name: None  . Number of children: 2  . Years of education: 60  . Highest education level: None  Social Needs  . Financial resource strain: None  . Food insecurity - worry: None  . Food insecurity - inability: None  . Transportation needs - medical: None  . Transportation needs - non-medical: None  Occupational History  . Occupation: Counsellor  Employer: [employer]  Tobacco Use  . Smoking status: Never Smoker  . Smokeless tobacco: Never Used  Substance and Sexual Activity  . Alcohol use: No    Alcohol/week: 0.0 oz  . Drug use: No  . Sexual activity: None  Other Topics Concern  . 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    Patient is single and lives alone.    Patient is right handed.     Family History  Problem Relation Age of Onset  . Coronary artery disease Mother   . Heart disease Mother   . Diabetes Mother   . Hypertension Mother   . Stroke Mother   . Alcohol abuse Father   . COPD Maternal Grandmother   . Hypertension Other   . Hyperlipidemia Other   . Diabetes Other   . Colon cancer Neg Hx     Review of Systems  Constitutional: Negative for chills, fatigue and fever.  Eyes: Negative for visual disturbance.  Musculoskeletal: Negative for back pain and gait problem.  Neurological: Positive for numbness. Negative for weakness, light-headedness and headaches.       Objective:   Vitals:   11/05/17 1051  BP: 132/78  Pulse: 85  Resp: 16  Temp: 97.9 F (36.6 C)  SpO2: 99%   Wt Readings from Last 3 Encounters:  11/05/17 214 lb (97.1 kg)    10/09/17 216 lb (98 kg)  09/22/17 215 lb (97.5 kg)   Body mass index is 35.61 kg/m.   Physical Exam  Constitutional: She appears well-developed and well-nourished. No distress.  HENT:  Head: Normocephalic and atraumatic.  Musculoskeletal: She exhibits no edema.  No muscular pain bilateral upper thighs  Neurological: No cranial nerve deficit. She exhibits normal muscle tone. Coordination normal.  Gait normal, normal strength all extremities; slight decreased sensation right anterior/lateral upper thigh, normal sensation elsewhere  Skin: She is not diaphoretic.           Assessment & Plan:    See Problem List for Assessment and Plan of chronic medical problems.

## 2017-11-05 NOTE — Assessment & Plan Note (Signed)
She does have an upcoming appointment with neurology No evidence of a multiple sclerosis flare Symptoms in her leg consistent with meralgia paresthetica.  She had transient tingling in her left cheek, but that has not recurred and no other concerning's for multiple sclerosis flare Continue to monitor See neurology if concerning symptoms arise

## 2017-11-05 NOTE — Patient Instructions (Signed)
Take the gabapentin three times a day - increase to 300 mg three times a day.  We can increase it further as needed.

## 2017-11-07 ENCOUNTER — Telehealth: Payer: Self-pay | Admitting: Internal Medicine

## 2017-11-07 DIAGNOSIS — G5711 Meralgia paresthetica, right lower limb: Secondary | ICD-10-CM

## 2017-11-07 MED ORDER — GABAPENTIN 100 MG PO CAPS: 300.0000 mg | ORAL_CAPSULE | Freq: Three times a day (TID) | ORAL | 1 refills | Status: DC

## 2017-11-07 NOTE — Telephone Encounter (Signed)
Copied from [facility] [id number]. Topic: Quick Communication - Rx Refill/Question >> Nov 07, 2017  3:25 PM Boyd Kerbs wrote: Medication:  gabapentin (NEURONTIN) 100 MG capsule  Need dosage clarification on new prescription.   Has the patient contacted their pharmacy? Yes.    Please call  [phone]  option 2   order # [id number]  (Agent: If no, request that the patient contact the pharmacy for the refill.)  Preferred Pharmacy (with phone number or street name):  [facility] [place] [facility] [address] [place] [address] Phone: [phone] Fax: [phone]     Please call  [phone]  option 2   order # [id number]   Agent: Please be advised that RX refills may take up to 3 business days. We ask that you follow-up with your pharmacy.

## 2017-11-07 NOTE — Telephone Encounter (Signed)
[facility]ed [facility] spoke w/pharmacist Roselyn Reef)...needing to verify instructions for the Gabapentin. Pt saw MD on 11/05/17, direction was change to 3 capsule TID.[name] Which should be 9 a day. Pharmacy change quantity to #810 which would be a 90 day. Voided old script that was for 2 capsule in am, 2 capsule at noon, and 3 at bedtime.Marland KitchenJohny Chess

## 2017-11-14 ENCOUNTER — Telehealth: Payer: Self-pay | Admitting: *Deleted

## 2017-11-14 NOTE — Telephone Encounter (Signed)
Rec'd call pt states she works at [employer], and the nitrate gloves that they have to wear makes her hands swell. Pt is wanting to know is there something MD can recommend can use on her hands.Marland KitchenJohny Chess

## 2017-11-14 NOTE — Telephone Encounter (Signed)
Notified pt w/MD response.../lmb 

## 2017-11-14 NOTE — Telephone Encounter (Signed)
I am not sure of all the options.  She should talk to her manager maybe they or occupational health can find /order a different glove for her.  They should provide an alternative

## 2017-11-20 ENCOUNTER — Ambulatory Visit: Payer: Self-pay

## 2017-11-20 NOTE — Telephone Encounter (Signed)
Phone call returned to pt.  Reported she saw Dr. Quay Burow 11/05/17 for the numbness in right thigh; stated those symptoms of the anterior and lateral thigh are unchanged.  Reported she was having intermittent low back pain, across the back in the waistband area of panty-line. Stated the low back pain has become more constant, since she is working 2 jobs, and walking on concrete floors all day.  Stated "I am on my feet and legs 90 % of the time."  Rated back discomfort @ 3/10 at present, and increases to 6/10 as the day progresses.  Stated "I am not a medicine taker, and don't want any narcotic for my pain."  Asking if Dr. Quay Burow has any recommendations for her.  Reported the only treatment she has tried is warm water/ Epsom Salts soaks.  Care advice given per protocol.   Will make Dr. Quay Burow aware of Triage call, for further recommendations.     Reason for Disposition . Back pain  Answer Assessment - Initial Assessment Questions 1. ONSET: "When did the pain begin?"      Low back pain intermittently since January 1st.  2. LOCATION: "Where does it hurt?" (upper, mid or lower back)     Across lower back  3. SEVERITY: "How bad is the pain?"  (e.g., Scale 1-10; mild, moderate, or severe)   - MILD (1-3): doesn't interfere with normal activities    - MODERATE (4-7): interferes with normal activities or awakens from sleep    - SEVERE (8-10): excruciating pain, unable to do any normal activities      3/10; worsens with standing and walking to about 6/10  4. PATTERN: "Is the pain constant?" (e.g., yes, no; constant, intermittent)     Now constant 5. RADIATION: "Does the pain shoot into your legs or elsewhere?"     Stays across lower back  6. CAUSE:  "What do you think is causing the back pain?"      Increased walking on cement floors with taking on a 2nd job 7. BACK OVERUSE:  "Any recent lifting of heavy objects, strenuous work or exercise?"     No  8. MEDICATIONS: "What have you taken so far for the pain?"  (e.g., nothing, acetaminophen, NSAIDS)     Taking Acetaminophen and Goody powder 9. NEUROLOGIC SYMPTOMS: "Do you have any weakness, numbness, or problems with bowel/bladder control?"     Previous burning / tingling sensation in the right ant. and lateral thigh 10. OTHER SYMPTOMS: "Do you have any other symptoms?" (e.g., fever, abdominal pain, burning with urination, blood in urine)      None 11. PREGNANCY: "Is there any chance you are pregnant?" (e.g., yes, no; LMP)       No; LMP over 5 years ago  Protocols used: BACK PAIN-A-AH

## 2017-11-20 NOTE — Telephone Encounter (Signed)
I can refer her to sports medicine or ortho

## 2017-11-20 NOTE — Telephone Encounter (Signed)
Pt states she would like to hold off on referral due to cost of a specialist. She will continue to treat at home and let us know if it gets any worse.

## 2017-11-26 ENCOUNTER — Telehealth: Payer: Self-pay | Admitting: Oncology

## 2017-11-26 ENCOUNTER — Encounter: Payer: Self-pay | Admitting: Internal Medicine

## 2017-11-26 DIAGNOSIS — M5416 Radiculopathy, lumbar region: Secondary | ICD-10-CM

## 2017-11-26 NOTE — Telephone Encounter (Signed)
Referral ordered - please schedule

## 2017-11-26 NOTE — Telephone Encounter (Signed)
9:34 am spoke with patient to confirm FMLA has been completed.  Patient requested to leave paperwork with front desk receptionist.

## 2017-11-28 ENCOUNTER — Other Ambulatory Visit: Payer: Self-pay | Admitting: Internal Medicine

## 2017-11-29 NOTE — Progress Notes (Signed)
[name] [address] [place], [place] [address] Phone: [phone] Subjective:    I'm seeing this patient by the request  of:  Binnie Rail, MD   CC: Back pain  [id number]  [name] [name] is a 57 y.o. female coming in with complaint of back pain.  History of breast cancer and multiple sclerosis.  Seen greater than a month ago.  Started on vitamin D and gabapentin initially.  Patient was lost to follow-up.  Patient states she has had pain for 2 months in addition to numbness in the right quad. Standing and walking for prolonged periods of time. Pain is intermittent can be stabbing and at times just a dull pain.  Patient has had some weight loss but it has been intentional   Patient did have x-rays taken October 31, 2016 and lumbar spine.  Independently visualized by me showing degenerative disc space narrowing at L4 through L5 as well as moderate facet joint arthritis at L5-S1.  Past Medical History:  Diagnosis Date  . Allergic rhinitis due to other allergen   . Allergy   . Angioedema    ACEI  . Anxiety   . Breast cancer of upper-outer quadrant of right female breast ([facility]) 10/23/2014   s/p r mastectomy, neoadj chemo completed 04/21/15; neg genetic testing  . Colon polyps 05/2014   adenomatous, colo q 5y  . CONSTIPATION, CHRONIC   . GERD   . Herpes zoster without complication 9/41/7408  . Hot flashes   . HYPERLIPIDEMIA   . HYPERTENSION   . Lactose intolerance   . Migraine headache   . Multiple sclerosis ([place])   . OBESITY, TRUNCAL    Past Surgical History:  Procedure Laterality Date  . BREAST BIOPSY Right    [place]   . COLONOSCOPY    . MASTECTOMY Right   . PORT-A-CATH REMOVAL Left 05/13/2015   Procedure: REMOVAL PORT-A-CATH;  Surgeon: [name], MD;  Location: [facility];  Service: General;  Laterality: Left;  . PORTACATH PLACEMENT Left 11/06/2014   Procedure: INSERTION PORT-A-CATH;  Surgeon: [name], MD;  Location: [facility];  Service: General;  Laterality: Left;  . SIMPLE MASTECTOMY WITH AXILLARY SENTINEL NODE BIOPSY Right 05/13/2015   Procedure: RIGHT TOTAL  MASTECTOMY WITH RIGHT  AXILLARY SENTINEL NODE BIOPSY;  Surgeon: [name], MD;  Location: [facility];  Service: General;  Laterality: Right;  . TUBAL LIGATION     Social History   Socioeconomic History  . Marital status: Single    Spouse name: None  . Number of children: 2  . Years of education: 9  . Highest education level: None  Social Needs  . Financial resource strain: None  . Food insecurity - worry: None  . Food insecurity - inability: None  . Transportation needs - medical: None  . Transportation needs - non-medical: None  Occupational History  . Occupation: Solicitor: [employer]  Tobacco Use  . Smoking status: Never Smoker  . Smokeless tobacco: Never Used  Substance and Sexual Activity  . Alcohol use: No    Alcohol/week: 0.0 oz  . Drug use: No  . Sexual activity: None  Other Topics Concern  . 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    Patient is single and lives alone.    Patient is right handed.    Allergies  Allergen Reactions  .  Ace Inhibitors Anaphylaxis     Angioedema (tongue swelling)  . Clarithromycin Anaphylaxis    Throat swells  . Contrave [Naltrexone-Bupropion Hcl Er]     Chest and sob  . Levaquin [Levofloxacin] Other (See Comments)    Tendon pain - shoulder and calf   Family History  Problem Relation Age of Onset  . Coronary artery disease Mother   . Heart disease Mother   . Diabetes Mother   . Hypertension Mother   . Stroke Mother   . Alcohol abuse Father   . COPD Maternal Grandmother   . Hypertension Other   . Hyperlipidemia Other   . Diabetes Other   . Colon cancer Neg Hx      Past medical history, social, surgical and family history all reviewed in electronic  medical record.  No pertanent information unless stated regarding to the chief complaint.   Review of Systems:Review of systems updated and as accurate as of 11/30/17  No headache, visual changes, nausea, vomiting, diarrhea, constipation, dizziness, abdominal pain, skin rash, fevers, chills, night sweats, weight loss, swollen lymph nodes, body aches, joint swelling, chest pain, shortness of breath, mood changes.  Positive muscle aches  Objective  Blood pressure 118/74, pulse 80, height 5\' 5"  (1.651 m), weight 216 lb (98 kg), last menstrual period 11/01/2012, SpO2 97 %. Systems examined below as of 11/30/17   General: No apparent distress alert and oriented x3 mood and affect normal, dressed appropriately.  HEENT: Pupils equal, extraocular movements intact  Respiratory: Patient's speak in full sentences and does not appear short of breath  Cardiovascular: No lower extremity edema, non tender, no erythema  Skin: Warm dry intact with no signs of infection or rash on extremities or on axial skeleton.  Abdomen: Soft nontender  Neuro: Cranial nerves II through XII are intact, neurovascularly intact in all extremities with 2+ DTRs and 2+ pulses.  Lymph: No lymphadenopathy of posterior or anterior cervical chain or axillae bilaterally.  Gait normal with good balance and coordination.  MSK:  Non tender with full range of motion and good stability and symmetric strength and tone of shoulders, elbows, wrist, hip, knee and ankles bilaterally.  Back Exam:  Inspection: Poor core strength with mild loss of lordosis Motion: Flexion 45 deg, Extension 25 deg, Side Bending to 35 deg bilaterally,  Rotation to 35 deg bilaterally  SLR laying: Negative  XSLR laying: Negative  Palpable tenderness: Tender to palpation the paraspinal musculature of the lumbar spine. FABER: negative. Sensory change: Gross sensation intact to all lumbar and sacral dermatomes.  Reflexes: 2+ at both patellar tendons, 2+ at achilles  tendons, Babinski's downgoing.  Strength at foot  Plantar-flexion: 5/5 Dorsi-flexion: 5/5 Eversion: 5/5 Inversion: 5/5  Leg strength  Quad: 5/5 Hamstring: 5/5 Hip flexor: 5/5 Hip abductors: 4/5  Gait unremarkable.    [id number]; 15 additional minutes spent for Therapeutic exercises as stated in above notes.  This included exercises focusing on stretching, strengthening, with significant focus on eccentric aspects.   Long term goals include an improvement in range of motion, strength, endurance as well as avoiding reinjury. Patient's frequency would include in 1-2 times a day, 3-5 times a week for a duration of 6-12 weeks. Low back exercises that included:  Pelvic tilt/bracing instruction to focus on control of the pelvic girdle and lower abdominal muscles  Glute strengthening exercises, focusing on proper firing of the glutes without engaging the low back muscles Proper stretching techniques for maximum relief for the hamstrings, hip flexors, low back and  some rotation where tolerated    Proper technique shown and discussed handout in great detail with ATC.  All questions were discussed and answered.     Impression and Recommendations:     This case required medical decision making of moderate complexity.      Note: This dictation was prepared with Dragon dictation along with smaller phrase technology. Any transcriptional errors that result from this process are unintentional.

## 2017-11-30 ENCOUNTER — Encounter: Payer: Self-pay | Admitting: Family Medicine

## 2017-11-30 ENCOUNTER — Ambulatory Visit: Payer: BC Managed Care – PPO | Admitting: Family Medicine

## 2017-11-30 DIAGNOSIS — M5416 Radiculopathy, lumbar region: Secondary | ICD-10-CM | POA: Diagnosis not present

## 2017-11-30 MED ORDER — VITAMIN D (ERGOCALCIFEROL) 1.25 MG (50000 UNIT) PO CAPS: 50000.0000 [IU] | ORAL_CAPSULE | ORAL | 0 refills | Status: DC

## 2017-11-30 MED ORDER — PREDNISONE 50 MG PO TABS: 50.0000 mg | ORAL_TABLET | Freq: Every day | ORAL | 0 refills | Status: DC

## 2017-11-30 NOTE — Assessment & Plan Note (Signed)
Questionable right-sided lumbar radiculopathy.  Patient does have a history of multiple sclerosis that complicates the picture.  No significant weakness noted on exam today that makes me not concerned for the demyelinating process.  Also think we do have some time at the moment.  Prednisone given, topical anti-inflammatories given, home exercises given and patient work with Product/process development scientist.  Discussed icing regimen and home exercises.  Once weekly vitamin D for muscle strength and endurance.  Return to clinic in 3 weeks

## 2017-11-30 NOTE — Patient Instructions (Signed)
Good to see you  Ice is your friend Ice 20 minutes 2 times daily. Usually after activity and before bed. Exercises 3 times a week.  pennsaid pinkie amount topically 2 times daily as needed.  Once weekly vitamin D for 12 weeks Prednisone daily for 5 days Wear good shoes See me again in 3-4 weeks

## 2017-12-02 ENCOUNTER — Other Ambulatory Visit: Payer: Self-pay | Admitting: Internal Medicine

## 2017-12-03 ENCOUNTER — Encounter: Payer: Self-pay | Admitting: Family Medicine

## 2017-12-10 ENCOUNTER — Ambulatory Visit: Payer: BC Managed Care – PPO | Admitting: Nurse Practitioner

## 2017-12-13 ENCOUNTER — Other Ambulatory Visit: Payer: Self-pay | Admitting: Internal Medicine

## 2017-12-21 ENCOUNTER — Ambulatory Visit: Payer: BC Managed Care – PPO | Admitting: Family Medicine

## 2017-12-21 DIAGNOSIS — Z0289 Encounter for other administrative examinations: Secondary | ICD-10-CM

## 2017-12-27 ENCOUNTER — Encounter: Payer: Self-pay | Admitting: Family Medicine

## 2018-01-03 ENCOUNTER — Other Ambulatory Visit: Payer: Self-pay | Admitting: Internal Medicine

## 2018-01-07 NOTE — Progress Notes (Signed)
[facility]  PATIENT: [name] [name] DOB: 1961/06/22   REASON FOR VISIT: Follow-up for relapsing remitting multiple sclerosis HISTORY FROM: Patient    HISTORY OF PRESENT ILLNESS:UPDATE 01/08/18 CM  [name] [name], 57 year old female returns for follow-up with history of relapsing remitting multiple sclerosis.  She is currently on Copaxone 3 times a week.  She denies any injection site issues.  She denies any exacerbation of symptoms in several years.  She continues to work full-time she denies any speech or swallowing difficulty visual changes problems with bowel or bladder control.  No feelings of being off balance no falls.  Last MRI of the brain at [facility] with Dr. Doyle Askew..  Due to her co-pay she would rather wait till next year to have repeat MRI since her symptoms have been stable.  She walks for exercise.  She returns for reevaluation    UPDATE 12/07/2016.CM. [name] [name], 57 year old female returns for follow-up with history of multiple sclerosis relapsing remitting. Her last visit with me was in 2014 and then she was seen by Dr. Doyle Askew at [facility] for a while. Patient is currently on Copaxone 3 times a week and she denies injection site issues.  She denies any side effects to Copaxone. She had previously been on Avonex. She has had no relapse of symptoms or exacerbation in several years. She was diagnosed with breast cancer in 2016 and received 20 rounds of chemotherapy. She is back to work full-time. She denies any new onset weakness sensory changes visual disturbance speech or swallowing problems bowel or bladder difficulty. She denies any falls. She denies any balance issues. She returns for reevaluation  HISTORY 11/03/15 CDKatherine [name] is a 57 y.o. female  Is seen here as a referral  from Dr. Asa Lente for MS care, headaches and re-establish care at [facility].  The patient has made this appointment by phone. Over the last 3 years , the patient had been followed  by Dr. Doyle Askew , neurologist at [facility].  She has MRIs at with Dr. Doyle Askew, and was in the meantime diagnosed with breast cancer.  She received the diagnosed on February 9th 2016 .  The patient had not been seen in our practice since Epic has been implemented. Her last visit was with Cecille Rubin on 10/30/2012, the last visit prior to that January  24th 2011.  She was on Copaxone and use it without injection site problems at the time she gets patient assistance through [facility] prior to Copaxone she was on very brisk but could not tolerate the medication she had also been on Avonex and developed side effects she remains on Copaxone now. She has switched to Copaxone 3 times weekly injection over 3 years ago and she had no exacerbation or relapse in several years. Since she was diagnosed with breast cancer 2016 she has undergone chemotherapy and a right breast mastectomy.  She has no acute problems and wants to be followed again at [facility] for MS.   REVIEW OF SYSTEMS: Full 14 system review of systems performed and notable only for those listed, all others are neg:  Constitutional: neg  Cardiovascular: neg Ear/Nose/Throat: neg  Skin: neg Eyes: neg Respiratory: neg Gastroitestinal: neg  Hematology/Lymphatic: neg  Endocrine: neg Musculoskeletal:neg Allergy/Immunology: neg Neurological: neg Psychiatric: neg Sleep : neg   ALLERGIES: Allergies  Allergen Reactions  . Ace Inhibitors Anaphylaxis     Angioedema (tongue swelling)  . Clarithromycin Anaphylaxis    Throat swells  . Contrave [Naltrexone-Bupropion Hcl Er]     Chest  and sob  . Levaquin [Levofloxacin] Other (See Comments)    Tendon pain - shoulder and calf    HOME MEDICATIONS: Outpatient Medications Prior to Visit  Medication Sig Dispense Refill  . ALPRAZolam (XANAX) 0.5 MG tablet Take 1 tablet (0.5 mg total) by mouth 2 (two) times daily as needed for anxiety or sleep. 90 tablet 0  . b complex vitamins tablet Take 1  tablet by mouth daily.    . budesonide-formoterol (SYMBICORT) 160-4.5 MCG/ACT inhaler Inhale 2 puffs into the lungs 2 (two) times daily. 1 Inhaler 0  . Cholecalciferol (VITAMIN D) 2000 units CAPS Take 1 capsule (2,000 Units total) by mouth daily. 30 capsule   . COPAXONE 40 MG/ML SOSY INJECT ONE SYRINGE (40 MG) SUBCUTANEOUSLY THREE TIMES PER WEEK AT LEAST 48 HOURS APART. ALLOW SYRINGE TO WARM TO ROOM TEMPERATURE FOR 20 MIN 36 Syringe 1  . ferrous gluconate (FERGON) 324 MG tablet Take 1 tablet (324 mg total) by mouth 2 (two) times daily with a meal. (Patient taking differently: Take 324 mg by mouth 3 (three) times a week. ) 180 tablet 3  . fluticasone (FLONASE) 50 MCG/ACT nasal spray USE 2 SPRAYS IN EACH       NOSTRIL DAILY 16 g 6  . gabapentin (NEURONTIN) 100 MG capsule Take 3 capsules (300 mg total) by mouth 3 (three) times daily. 810 capsule 1  . hydrochlorothiazide (HYDRODIURIL) 25 MG tablet Take 1 tablet (25 mg total) by mouth daily. 90 tablet 3  . ibuprofen (ADVIL,MOTRIN) 800 MG tablet Take 1 tablet (800 mg total) by mouth every 8 (eight) hours as needed. 60 tablet 1  . LINZESS 145 MCG CAPS capsule TAKE 1 CAPSULE(145 MCG) BY MOUTH DAILY BEFORE BREAKFAST 90 capsule 1  . loratadine-pseudoephedrine (CLARITIN-D 12 HOUR) 5-120 MG tablet Take 1 tablet by mouth daily as needed (seasonal allergies). 30 tablet 3  . losartan (COZAAR) 50 MG tablet Take 1 tablet (50 mg total) by mouth 2 (two) times daily. 180 tablet 3  . metoprolol tartrate (LOPRESSOR) 25 MG tablet TAKE 1 TABLET BY MOUTH TWICE DAILY 180 tablet 3  . pantoprazole (PROTONIX) 40 MG tablet Take 1 tablet (40 mg total) by mouth daily. (Patient taking differently: Take 40 mg by mouth 3 (three) times a week. ) 90 tablet 2  . potassium chloride (MICRO-K) 10 MEQ CR capsule TAKE 2 CAPSULES BY MOUTH TWICE DAILY 120 capsule 0  . Vitamin D, Ergocalciferol, (DRISDOL) 50000 units CAPS capsule Take 1 capsule (50,000 Units total) by mouth every 7 (seven) days.  12 capsule 0  . hydrochlorothiazide (HYDRODIURIL) 25 MG tablet Take 1 tablet (25 mg total) by mouth daily. 90 tablet 3  . predniSONE (DELTASONE) 50 MG tablet Take 1 tablet (50 mg total) by mouth daily. 5 tablet 0   No facility-administered medications prior to visit.     PAST MEDICAL HISTORY: Past Medical History:  Diagnosis Date  . Allergic rhinitis due to other allergen   . Allergy   . Angioedema    ACEI  . Anxiety   . Breast cancer of upper-outer quadrant of right female breast ([place]) 10/23/2014   s/p r mastectomy, neoadj chemo completed 04/21/15; neg genetic testing  . Colon polyps 05/2014   adenomatous, colo q 5y  . CONSTIPATION, CHRONIC   . GERD   . Herpes zoster without complication 2/50/5397  . Hot flashes   . HYPERLIPIDEMIA   . HYPERTENSION   . Lactose intolerance   . Migraine headache   . Multiple sclerosis (  [place])   . OBESITY, TRUNCAL     PAST SURGICAL HISTORY: Past Surgical History:  Procedure Laterality Date  . BREAST BIOPSY Right    [place]   . COLONOSCOPY    . MASTECTOMY Right   . PORT-A-CATH REMOVAL Left 05/13/2015   Procedure: REMOVAL PORT-A-CATH;  Surgeon: [name], MD;  Location: [place];  Service: General;  Laterality: Left;  . PORTACATH PLACEMENT Left 11/06/2014   Procedure: INSERTION PORT-A-CATH;  Surgeon: [name], MD;  Location: [place];  Service: General;  Laterality: Left;  . SIMPLE MASTECTOMY WITH AXILLARY SENTINEL NODE BIOPSY Right 05/13/2015   Procedure: RIGHT TOTAL  MASTECTOMY WITH RIGHT  AXILLARY SENTINEL NODE BIOPSY;  Surgeon: [name], MD;  Location: [place];  Service: General;  Laterality: Right;  . TUBAL LIGATION      FAMILY HISTORY: Family History  Problem Relation Age of Onset  . Coronary artery disease Mother   . Heart disease Mother   . Diabetes Mother   . Hypertension Mother   . Stroke Mother   . Alcohol abuse Father   . COPD Maternal Grandmother   . Hypertension Other   . Hyperlipidemia Other     . Diabetes Other   . Colon cancer Neg Hx     SOCIAL HISTORY: Social History   Socioeconomic History  . Marital status: Single    Spouse name: Not on file  . Number of children: 2  . Years of education: 47  . Highest education level: Not on file  Occupational History  . Occupation: Solicitor: [employer]  Social Needs  . Financial resource strain: Not on file  . Food insecurity:    Worry: Not on file    Inability: Not on file  . Transportation needs:    Medical: Not on file    Non-medical: Not on file  Tobacco Use  . Smoking status: Never Smoker  . Smokeless tobacco: Never Used  Substance and Sexual Activity  . Alcohol use: No    Alcohol/week: 0.0 oz  . Drug use: No  . Sexual activity: Not on file  Lifestyle  . Physical activity:    Days per week: Not on file    Minutes per session: Not on file  . Stress: Not on file  Relationships  . Social connections:    Talks on phone: Not on file    Gets together: Not on file    Attends religious service: Not on file    Active member of club or organization: Not on file    Attends meetings of clubs or organizations: Not on file    Relationship status: Not on file  . Intimate partner violence:    Fear of current or ex partner: Not on file    Emotionally abused: Not on file    Physically abused: Not on file    Forced sexual activity: Not on file  Other Topics Concern  . 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    Patient is single and lives alone.    Patient is right handed.      PHYSICAL EXAM  Vitals:   01/08/18 0736  BP: 133/87  Pulse: 82  Weight: 214 lb (97.1 kg)  Height: 5\' 5"  (1.651 m)   Body mass index is 35.61 kg/m.  Generalized: Well developed, Obese female in no acute distress  Head: normocephalic  and atraumatic,. Oropharynx benign  Neck: Supple,   Musculoskeletal: No  deformity   Neurological examination   Mentation: Alert oriented to time, place, history taking. Attention span and concentration appropriate. Recent and remote memory intact.  Follows all commands speech and language fluent.   Cranial nerve II-XII: Visual acuity 20/30 bil without correction. Fundoscopic exam reveals sharp disc margins.Pupils were equal round reactive to light extraocular movements were full, visual field were full on confrontational test. Facial sensation and strength were normal. hearing was intact to finger rubbing bilaterally. Uvula tongue midline. head turning and shoulder shrug were normal and symmetric.Tongue protrusion into cheek strength was normal. Motor: normal bulk and tone, full strength in the BUE, BLE, fine finger movements normal, no pronator drift. No focal weakness Sensory: normal and symmetric to light touch, pinprick, and  Vibration, in the upper and lower extremities Coordination: finger-nose-finger, heel-to-shin bilaterally, no dysmetria, no tremor Reflexes: Symmetric upper and lower, plantar responses were flexor bilaterally. Gait and Station: Rising up from seated position without assistance, normal stance,  moderate stride, good arm swing, smooth turning, able to perform tiptoe, and heel walking without difficulty. Tandem gait is steady  DIAGNOSTIC DATA (LABS, IMAGING, TESTING) - I reviewed patient records, labs, notes, testing and imaging myself where available.  Lab Results  Component Value Date   WBC 6.8 10/09/2017   HGB 12.2 10/09/2017   HCT 36.0 10/09/2017   MCV 87.3 10/09/2017   PLT 243.0 10/09/2017      Component Value Date/Time   NA 139 10/09/2017 1003   NA 142 04/05/2017 1040   K 3.7 10/09/2017 1003   K 4.5 04/05/2017 1040   CL 103 10/09/2017 1003   CO2 28 10/09/2017 1003   CO2 25 04/05/2017 1040   GLUCOSE 90 10/09/2017 1003   GLUCOSE 89 04/05/2017 1040   BUN 15 10/09/2017 1003   BUN 18.6 04/05/2017 1040   CREATININE 0.83  10/09/2017 1003   CREATININE 1.0 04/05/2017 1040   CALCIUM 9.4 10/09/2017 1003   CALCIUM 10.2 04/05/2017 1040   PROT 6.7 10/09/2017 1003   PROT 7.7 04/05/2017 1040   ALBUMIN 4.3 10/09/2017 1003   ALBUMIN 4.2 04/05/2017 1040   AST 17 10/09/2017 1003   AST 17 04/05/2017 1040   ALT 17 10/09/2017 1003   ALT 17 04/05/2017 1040   ALKPHOS 83 10/09/2017 1003   ALKPHOS 101 04/05/2017 1040   BILITOT 0.5 10/09/2017 1003   BILITOT 0.56 04/05/2017 1040   GFRNONAA 89 02/21/2017 1027   GFRAA 103 02/21/2017 1027       ASSESSMENT AND PLAN  57 y.o. year old female  has a past medical history of  Migraine headache; Multiple sclerosis ([place]); and OBESITY, . here To follow-up for her relapsing remitting multiple sclerosis which is currently stable.  PLAN: Continue Copaxone 3 times weekly will refill Exercise for overall health, healthy diet with lean meats fresh fruits and vegetables Call for exacerbation of symptoms Follow up in 1 year Will repeat MRI of the brain at that time I spent 20 min in total face to face time with the patient more than 50% of which was spent counseling and coordination of care, reviewing test results reviewing medications and discussing and reviewing the diagnosis of relapsing remitting multiple sclerosis and further treatment options. Patient wants to continue on Copaxone for now , she was made aware that there are oral options Dennie Bible, [facility], [facility], APRN  [facility] [address], [place], [place] [address] [phone]  [phone] 

## 2018-01-08 ENCOUNTER — Ambulatory Visit: Payer: BC Managed Care – PPO | Admitting: Nurse Practitioner

## 2018-01-08 ENCOUNTER — Encounter: Payer: Self-pay | Admitting: Nurse Practitioner

## 2018-01-08 VITALS — BP 133/87 | HR 82 | Ht 65.0 in | Wt 214.0 lb

## 2018-01-08 DIAGNOSIS — G35 Multiple sclerosis: Secondary | ICD-10-CM

## 2018-01-08 MED ORDER — COPAXONE 40 MG/ML ~~LOC~~ SOSY: PREFILLED_SYRINGE | SUBCUTANEOUS | 3 refills | Status: DC

## 2018-01-08 NOTE — Patient Instructions (Signed)
Continue Copaxone 3 times weekly will refill Exercise for overall health, healthy diet with lean meats fresh fruits and vegetables Call for exacerbation of symptoms Follow up in 1 year Will repeat MRI of the brain at that time

## 2018-01-15 ENCOUNTER — Other Ambulatory Visit: Payer: Self-pay | Admitting: Internal Medicine

## 2018-01-23 ENCOUNTER — Encounter: Payer: Self-pay | Admitting: Internal Medicine

## 2018-01-23 ENCOUNTER — Ambulatory Visit: Payer: BC Managed Care – PPO | Admitting: Internal Medicine

## 2018-01-23 VITALS — BP 122/80 | HR 100 | Temp 98.7°F | Resp 16 | Wt 216.0 lb

## 2018-01-23 DIAGNOSIS — H61891 Other specified disorders of right external ear: Secondary | ICD-10-CM

## 2018-01-23 DIAGNOSIS — H61899 Other specified disorders of external ear, unspecified ear: Secondary | ICD-10-CM

## 2018-01-23 DIAGNOSIS — F419 Anxiety disorder, unspecified: Secondary | ICD-10-CM

## 2018-01-23 DIAGNOSIS — E669 Obesity, unspecified: Secondary | ICD-10-CM

## 2018-01-23 DIAGNOSIS — M6530 Trigger finger, unspecified finger: Secondary | ICD-10-CM | POA: Diagnosis not present

## 2018-01-23 HISTORY — DX: Other specified disorders of external ear, unspecified ear: H61.899

## 2018-01-23 MED ORDER — PHENTERMINE HCL 30 MG PO CAPS: 30.0000 mg | ORAL_CAPSULE | ORAL | 0 refills | Status: DC

## 2018-01-23 MED ORDER — POTASSIUM CHLORIDE ER 10 MEQ PO CPCR
20.0000 meq | ORAL_CAPSULE | Freq: Two times a day (BID) | ORAL | 1 refills | Status: DC
Start: 2018-01-23 — End: 2018-08-04

## 2018-01-23 NOTE — Patient Instructions (Signed)
Phentermine was sent to your pharmacy.

## 2018-01-23 NOTE — Assessment & Plan Note (Signed)
More than one finger Mild Discussed keep fingers moving  If gets worse can consider referral

## 2018-01-23 NOTE — Assessment & Plan Note (Signed)
Unsuccessful with weight loss efforts Requesting phentermine again which she has done well with in the past - will prescribe short term Stressed decreased portions, healthy diet Stressed regular exercise - needs to be more than 3/week

## 2018-01-23 NOTE — Progress Notes (Signed)
Subjective:    Patient ID: [name] [name], female    DOB: Sep 13, 1961, 57 y.o.   MRN: [id number]  HPI The patient is here for an acute visit.   B/l index fingers lock and the other day all the fingers on her left hand locked up.  She was wondering why this happened.  She tries to move her fingers ar lot.    She does not sleep well and on occasion she takes z-quill.  She wanted to know if that was ok.  It does help.  She does not take it regularly.    Increased anxiety - recently there was a snake outside her apartment complex and she has had increased anxiety about this since then.  She can not stop thinking about a snake being in her apartment.    Her right leg pain and burning sensation has improved after seeing Dr Tamala Julian.   Obesity - Wants to start phentermine again.  She has taken it before and had no side effects.  She wants to lose weight.  She knows what she needs to do.    Right ear feels clogged and itches.  She denies ear pain, discharge or change in hearing.  Medications and allergies reviewed with patient and updated if appropriate.  Patient Active Problem List   Diagnosis Date Noted  . Meralgia paraesthetica, right 11/05/2017  . Iron deficiency 10/09/2017  . Vitamin D deficiency 06/15/2017  . LUQ cramping 04/21/2017  . Lumbar radiculopathy 04/21/2017  . Trigger index finger of left hand 04/21/2017  . Obesity (BMI 35.0-39.9 without comorbidity) 04/21/2017  . Left hip pain 10/31/2016  . Pes planus 10/31/2016  . Posterior tibialis muscle dysfunction 10/31/2016  . Depression 09/03/2016  . Memory difficulties 09/03/2016  . Breast pain, right 08/24/2016  . Chronic fatigue 06/08/2016  . Palpitations 04/04/2016  . 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 Malignant neoplasm of upper-outer quadrant of right breast in female, 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[place]) 11/03/2015  . S/P mastectomy 05/13/2015  . 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 Genetic testing 12/11/2014    . Breast cancer of upper-outer quadrant of right female breast ([place]) 10/23/2014  . Anxiety 04/29/2012  . Allergic rhinitis 11/29/2009  . 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 CONSTIPATION, CHRONIC 08/17/2008  . Dyslipidemia 04/24/2008  . METABOLIC SYNDROME X 93/79/0240  . GERD 01/07/2008  . Essential hypertension 09/20/2007  . Multiple sclerosis ([place]) 09/18/2007    Current Outpatient Medications on File Prior to Visit  Medication Sig Dispense Refill  . ALPRAZolam (XANAX) 0.5 MG tablet Take 1 tablet (0.5 mg total) by mouth 2 (two) times daily as needed for anxiety or sleep. 90 tablet 0  . b complex vitamins tablet Take 1 tablet by mouth daily.    . budesonide-formoterol (SYMBICORT) 160-4.5 MCG/ACT inhaler Inhale 2 puffs into the lungs 2 (two) times daily. 1 Inhaler 0  . Cholecalciferol (VITAMIN D) 2000 units CAPS Take 1 capsule (2,000 Units total) by mouth daily. 30 capsule   . COPAXONE 40 MG/ML SOSY 40mg /ml SQ three times weekly 36 Syringe 3  . ferrous gluconate (FERGON) 324 MG tablet Take 1 tablet (324 mg total) by mouth 2 (two) times daily with a meal. (Patient taking differently: Take 324 mg by mouth 3 (three) times a week. ) 180 tablet 3  . fluticasone (FLONASE) 50 MCG/ACT nasal spray USE 2 SPRAYS IN EACH       NOSTRIL DAILY 16 g 6  . gabapentin (NEURONTIN) 100 MG capsule Take 3 capsules (300 mg total)  by mouth 3 (three) times daily. 810 capsule 1  . hydrochlorothiazide (HYDRODIURIL) 25 MG tablet Take 1 tablet (25 mg total) by mouth daily. 90 tablet 3  . ibuprofen (ADVIL,MOTRIN) 800 MG tablet Take 1 tablet (800 mg total) by mouth every 8 (eight) hours as needed. 60 tablet 1  . LINZESS 145 MCG CAPS capsule TAKE 1 CAPSULE(145 MCG) BY MOUTH DAILY BEFORE BREAKFAST 90 capsule 1  . loratadine-pseudoephedrine (CLARITIN-D 12 HOUR) 5-120 MG tablet Take 1 tablet by mouth daily as needed (seasonal allergies). 30 tablet 3  . losartan (COZAAR) 50 MG tablet Take 1 tablet (50 mg total) by mouth 2 (two) times daily.  180 tablet 3  . metoprolol tartrate (LOPRESSOR) 25 MG tablet TAKE 1 TABLET BY MOUTH TWICE DAILY 180 tablet 3  . pantoprazole (PROTONIX) 40 MG tablet Take 1 tablet (40 mg total) by mouth daily. (Patient taking differently: Take 40 mg by mouth 3 (three) times a week. ) 90 tablet 2  . potassium chloride (MICRO-K) 10 MEQ CR capsule Take 2 capsules (20 mEq total) by mouth 2 (two) times daily. -- Office visit needed for further refills 120 capsule 0  . Vitamin D, Ergocalciferol, (DRISDOL) 50000 units CAPS capsule Take 1 capsule (50,000 Units total) by mouth every 7 (seven) days. 12 capsule 0  . [DISCONTINUED] mometasone (NASONEX) 50 MCG/ACT nasal spray Place 2 sprays into the nose daily. 17 g 2   No current facility-administered medications on file prior to visit.     Past Medical History:  Diagnosis Date  . Allergic rhinitis due to other allergen   . Allergy   . Angioedema    ACEI  . Anxiety   . Breast cancer of upper-outer quadrant of right female breast ([place]) 10/23/2014   s/p r mastectomy, neoadj chemo completed 04/21/15; neg genetic testing  . Colon polyps 05/2014   adenomatous, colo q 5y  . CONSTIPATION, CHRONIC   . GERD   . Herpes zoster without complication 05/01/4817  . Hot flashes   . HYPERLIPIDEMIA   . HYPERTENSION   . Lactose intolerance   . Migraine headache   . Multiple sclerosis ([place])   . OBESITY, TRUNCAL     Past Surgical History:  Procedure Laterality Date  . BREAST BIOPSY Right    [place]   . COLONOSCOPY    . MASTECTOMY Right   . PORT-A-CATH REMOVAL Left 05/13/2015   Procedure: REMOVAL PORT-A-CATH;  Surgeon: [name], MD;  Location: [place];  Service: General;  Laterality: Left;  . PORTACATH PLACEMENT Left 11/06/2014   Procedure: INSERTION PORT-A-CATH;  Surgeon: [name], MD;  Location: [place];  Service: General;  Laterality: Left;  . SIMPLE MASTECTOMY WITH AXILLARY SENTINEL NODE BIOPSY Right 05/13/2015   Procedure: RIGHT TOTAL  MASTECTOMY  WITH RIGHT  AXILLARY SENTINEL NODE BIOPSY;  Surgeon: [name], MD;  Location: [place];  Service: General;  Laterality: Right;  . TUBAL LIGATION      Social History   Socioeconomic History  . Marital status: Single    Spouse name: Not on file  . Number of children: 2  . Years of education: 77  . Highest education level: Not on file  Occupational History  . 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 Financial resource strain: Not on file  . Food insecurity:    Worry: Not on file    Inability: Not on file  . Transportation needs:    Medical: Not on file  Non-medical: Not on file  Tobacco Use  . Smoking status: Never Smoker  . Smokeless tobacco: Never Used  Substance and Sexual Activity  . Alcohol use: No    Alcohol/week: 0.0 oz  . Drug use: No  . Sexual activity: Not on file  Lifestyle  . Physical activity:    Days per week: Not on file    Minutes per session: Not on file  . Stress: Not on file  Relationships  . Social connections:    Talks on phone: Not on file    Gets together: Not on file    Attends religious service: Not on file    Active member of club or organization: Not on file    Attends meetings of clubs or organizations: Not on file    Relationship status: Not on file  Other Topics Concern  . 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    Patient is single and lives alone.    Patient is right handed.     Family History  Problem Relation Age of Onset  . Coronary artery disease Mother   . Heart disease Mother   . Diabetes Mother   . Hypertension Mother   . Stroke Mother   . Alcohol abuse Father   . COPD Maternal Grandmother   . Hypertension Other   . Hyperlipidemia Other   . Diabetes Other   . Colon cancer Neg Hx     Review of Systems  Constitutional: Negative for chills and fever.  HENT: Negative for  congestion, ear discharge, ear pain, hearing loss, sinus pain and sore throat.   Musculoskeletal: Positive for arthralgias.  Psychiatric/Behavioral: Positive for sleep disturbance. The patient is nervous/anxious.        Objective:   Vitals:   01/23/18 1543  BP: 122/80  Pulse: 100  Resp: 16  Temp: 98.7 F (37.1 C)  SpO2: 96%   BP Readings from Last 3 Encounters:  01/23/18 122/80  01/08/18 133/87  11/30/17 118/74   Wt Readings from Last 3 Encounters:  01/23/18 216 lb (98 kg)  01/08/18 214 lb (97.1 kg)  11/30/17 216 lb (98 kg)   Body mass index is 35.94 kg/m.   Physical Exam  Constitutional: She appears well-developed and well-nourished. No distress.  HENT:  Head: Normocephalic and atraumatic.  Right Ear: External ear normal.  Left Ear: External ear normal.  Mouth/Throat: Oropharynx is clear and moist.  Scant wax in right ear canal - otherwise normal, R TM normal.  Left ear canal and TM normal  Neck: No tracheal deviation present. No thyromegaly present.  Musculoskeletal: She exhibits no edema, tenderness or deformity.  Lymphadenopathy:    She has no cervical adenopathy.  Skin: Skin is warm and dry. She is not diaphoretic.  Psychiatric: She has a normal mood and affect. Her behavior is normal.           Assessment & Plan:    See Problem List for Assessment and Plan of chronic medical problems.

## 2018-01-23 NOTE — Assessment & Plan Note (Signed)
Increased recently, but overall ok Continue xanax prn

## 2018-01-23 NOTE — Assessment & Plan Note (Signed)
Minimal dryness, no excessive cerumen Otherwise normal No treatment needed

## 2018-01-25 ENCOUNTER — Telehealth: Payer: Self-pay | Admitting: Emergency Medicine

## 2018-01-25 NOTE — Telephone Encounter (Signed)
PA has been approved. Notified Pharmacy.

## 2018-01-25 NOTE — Telephone Encounter (Signed)
PA completed for Phentermine. Key [id number]. Awaiting Response.

## 2018-02-21 ENCOUNTER — Encounter: Payer: Self-pay | Admitting: General Practice

## 2018-02-21 NOTE — Progress Notes (Signed)
[facility] CSW Progress Note  Call from patient, wants referral for supportive therapy related to adjusting to cancer survivorship.  Is not receiving much support from family members, feels isolated, lives alone.  CSW will work on referral to community provider who can help w family work needed.  Patient agreeable to this plan.  Edwyna Shell, LCSW Clinical Social Worker Phone:  [phone]

## 2018-02-22 ENCOUNTER — Telehealth: Payer: Self-pay | Admitting: General Practice

## 2018-02-22 NOTE — Telephone Encounter (Signed)
[place] CSW Progress Note  Patient referred to the [facility] ([facility] Group [phone]) for help w individual and family counseling as requested.  Referral made, patient given practice contact information to follow up if needed.  Edwyna Shell, LCSW Clinical Social Worker Phone:  [phone]

## 2018-03-11 ENCOUNTER — Encounter: Payer: Self-pay | Admitting: Internal Medicine

## 2018-03-11 DIAGNOSIS — G5711 Meralgia paresthetica, right lower limb: Secondary | ICD-10-CM

## 2018-03-11 MED ORDER — AMOXICILLIN-POT CLAVULANATE 875-125 MG PO TABS: 1.0000 | ORAL_TABLET | Freq: Two times a day (BID) | ORAL | 0 refills | Status: DC

## 2018-03-11 MED ORDER — GABAPENTIN 100 MG PO CAPS: 300.0000 mg | ORAL_CAPSULE | Freq: Three times a day (TID) | ORAL | 0 refills | Status: DC

## 2018-03-11 NOTE — Telephone Encounter (Signed)
Pt notified about med and we will call back about other issues.

## 2018-03-11 NOTE — Addendum Note (Signed)
Addended by: Binnie Rail on: 03/11/2018 09:05 PM   Modules accepted: Orders

## 2018-03-11 NOTE — Telephone Encounter (Signed)
I will prescribe an antibiotic - augmentin bid x 10 days.  Next time she should be seen for infections.    (rx sent to [facility])

## 2018-03-11 NOTE — Telephone Encounter (Signed)
RX sent. Please advise on #2

## 2018-03-20 ENCOUNTER — Telehealth: Payer: Self-pay | Admitting: Internal Medicine

## 2018-03-20 NOTE — Telephone Encounter (Signed)
With menopause in general and the weight that is gained will tend to go to the abdomen.  This will also be the last place that it will be lost.  If there are further questions she should be evaluated so we can make sure nothing else is causing abdominal distention.

## 2018-03-20 NOTE — Telephone Encounter (Signed)
Copied from [place] [phone]. Topic: General - Other >> Mar 20, 2018 12:17 PM [name] [name], NT wrote: Reason for CRM: Patient would like a return call from Dr Quay Burow or nurse only not from the [facility]  and will not elaborate please call her at  [phone]

## 2018-03-20 NOTE — Telephone Encounter (Signed)
Routing to dr burns, patient wants to know if there is any medical reason why her stomach would be getting larger, can it be due to hormones, or menapause?  Patient wanted to talk directly with physician, I did advise if she's active on mychart, can send email, but I would forward message to dr burns, and dr burns may require office visit for assessment----please advise, I will call patient back, thanks

## 2018-03-22 NOTE — Telephone Encounter (Signed)
Advised patient with dr burns instructions, patient states she has already made appt to see dr burns

## 2018-03-28 NOTE — Patient Instructions (Addendum)
  Medications reviewed and updated.  No changes recommended at this time.  Your prescription(s) have been submitted to your pharmacy. Please take as directed and contact our office if you believe you are having problem(s) with the medication(s).      

## 2018-03-28 NOTE — Progress Notes (Signed)
Subjective:    Patient ID: [name] [name], female    DOB: 1961/05/26, 57 y.o.   MRN: [id number]  HPI The patient is here for follow up.  Abdominal distension: She is concerned about her abdomen being distended or so large.  She has been exercising regularly-has been exercising 30 minutes a day and for the past 2-3 weeks has been eating very healthy.   she wanted to know how she got her abdomen smaller.  Hypertension: She is taking her medication daily. She is compliant with a low sodium diet.  She denies chest pain, palpitations, edema, shortness of breath and regular headaches. She is exercising regularly - 30 minutes a day.      GERD:  She is taking her medication twice a week.   She denies any GERD symptoms and feels her GERD is well controlled.   Iron deficiency: She is taking iron pills 3 times a week.  Her right ear feels itchy at times, but she denies pain.    Medications and allergies reviewed with patient and updated if appropriate.  Patient Active Problem List   Diagnosis Date Noted  . Ear canal dryness 01/23/2018  . Trigger finger 01/23/2018  . Meralgia paraesthetica, right 11/05/2017  . Iron deficiency 10/09/2017  . Vitamin D deficiency 06/15/2017  . LUQ cramping 04/21/2017  . Lumbar radiculopathy 04/21/2017  . Trigger index finger of left hand 04/21/2017  . Obesity (BMI 35.0-39.9 without comorbidity) 04/21/2017  . Left hip pain 10/31/2016  . Pes planus 10/31/2016  . Posterior tibialis muscle dysfunction 10/31/2016  . Depression 09/03/2016  . Memory difficulties 09/03/2016  . Breast pain, right 08/24/2016  . Chronic fatigue 06/08/2016  . Palpitations 04/04/2016  . 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 Malignant neoplasm of upper-outer quadrant of right breast in female, 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[place]) 11/03/2015  . S/P mastectomy 05/13/2015  . Chemotherapy-induced neuropathy ([place]) 04/21/2015  . Genetic testing 12/11/2014  . Breast cancer of upper-outer quadrant  of right female breast ([place]) 10/23/2014  . Anxiety 04/29/2012  . Allergic rhinitis 11/29/2009  . 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 CONSTIPATION, CHRONIC 08/17/2008  . Dyslipidemia 04/24/2008  . METABOLIC SYNDROME X 76/22/6333  . GERD 01/07/2008  . Essential hypertension 09/20/2007  . Multiple sclerosis ([place]) 09/18/2007    Current Outpatient Medications on File Prior to Visit  Medication Sig Dispense Refill  . ALPRAZolam (XANAX) 0.5 MG tablet Take 1 tablet (0.5 mg total) by mouth 2 (two) times daily as needed for anxiety or sleep. 90 tablet 0  . b complex vitamins tablet Take 1 tablet by mouth daily.    . budesonide-formoterol (SYMBICORT) 160-4.5 MCG/ACT inhaler Inhale 2 puffs into the lungs 2 (two) times daily. 1 Inhaler 0  . Cholecalciferol (VITAMIN D) 2000 units CAPS Take 1 capsule (2,000 Units total) by mouth daily. 30 capsule   . COPAXONE 40 MG/ML SOSY 40mg /ml SQ three times weekly 36 Syringe 3  . ferrous gluconate (FERGON) 324 MG tablet Take 1 tablet (324 mg total) by mouth 2 (two) times daily with a meal. (Patient taking differently: Take 324 mg by mouth 3 (three) times a week. ) 180 tablet 3  . fluticasone (FLONASE) 50 MCG/ACT nasal spray USE 2 SPRAYS IN EACH       NOSTRIL DAILY 16 g 6  . gabapentin (NEURONTIN) 100 MG capsule Take 3 capsules (300 mg total) by mouth 3 (three) times daily. 810 capsule 0  . hydrochlorothiazide (HYDRODIURIL) 25 MG tablet Take 1 tablet (25 mg total) by mouth  daily. 90 tablet 3  . ibuprofen (ADVIL,MOTRIN) 800 MG tablet Take 1 tablet (800 mg total) by mouth every 8 (eight) hours as needed. 60 tablet 1  . LINZESS 145 MCG CAPS capsule TAKE 1 CAPSULE(145 MCG) BY MOUTH DAILY BEFORE BREAKFAST 90 capsule 1  . loratadine-pseudoephedrine (CLARITIN-D 12 HOUR) 5-120 MG tablet Take 1 tablet by mouth daily as needed (seasonal allergies). 30 tablet 3  . losartan (COZAAR) 50 MG tablet Take 1 tablet (50 mg total) by mouth 2 (two) times daily. 180 tablet 3  . metoprolol tartrate  (LOPRESSOR) 25 MG tablet TAKE 1 TABLET BY MOUTH TWICE DAILY 180 tablet 3  . pantoprazole (PROTONIX) 40 MG tablet Take 1 tablet (40 mg total) by mouth daily. (Patient taking differently: Take 40 mg by mouth 3 (three) times a week. ) 90 tablet 2  . phentermine 30 MG capsule Take 1 capsule (30 mg total) by mouth every morning. 30 capsule 0  . potassium chloride (MICRO-K) 10 MEQ CR capsule Take 2 capsules (20 mEq total) by mouth 2 (two) times daily. 360 capsule 1  . Vitamin D, Ergocalciferol, (DRISDOL) 50000 units CAPS capsule Take 1 capsule (50,000 Units total) by mouth every 7 (seven) days. 12 capsule 0  . [DISCONTINUED] mometasone (NASONEX) 50 MCG/ACT nasal spray Place 2 sprays into the nose daily. 17 g 2   No current facility-administered medications on file prior to visit.     Past Medical History:  Diagnosis Date  . Allergic rhinitis due to other allergen   . Allergy   . Angioedema    ACEI  . Anxiety   . Breast cancer of upper-outer quadrant of right female breast ([place]) 10/23/2014   s/p r mastectomy, neoadj chemo completed 04/21/15; neg genetic testing  . Colon polyps 05/2014   adenomatous, colo q 5y  . CONSTIPATION, CHRONIC   . GERD   . Herpes zoster without complication 9/56/2130  . Hot flashes   . HYPERLIPIDEMIA   . HYPERTENSION   . Lactose intolerance   . Migraine headache   . Multiple sclerosis ([place])   . OBESITY, TRUNCAL     Past Surgical History:  Procedure Laterality Date  . BREAST BIOPSY Right    [place]   . COLONOSCOPY    . MASTECTOMY Right   . PORT-A-CATH REMOVAL Left 05/13/2015   Procedure: REMOVAL PORT-A-CATH;  Surgeon: [name], MD;  Location: [place];  Service: General;  Laterality: Left;  . PORTACATH PLACEMENT Left 11/06/2014   Procedure: INSERTION PORT-A-CATH;  Surgeon: [name], MD;  Location: [place];  Service: General;  Laterality: Left;  . SIMPLE MASTECTOMY WITH AXILLARY SENTINEL NODE BIOPSY Right 05/13/2015   Procedure: RIGHT  TOTAL  MASTECTOMY WITH RIGHT  AXILLARY SENTINEL NODE BIOPSY;  Surgeon: [name], MD;  Location: [place];  Service: General;  Laterality: Right;  . TUBAL LIGATION      Social History   Socioeconomic History  . Marital status: Single    Spouse name: Not on file  . Number of children: 2  . Years of education: 4  . Highest education level: Not on file  Occupational History  . Occupation: Solicitor: [employer]  Social Needs  . Financial resource strain: Not on file  . Food insecurity:    Worry: Not on file    Inability: Not on file  . Transportation needs:    Medical: Not on file    Non-medical: Not on file  Tobacco Use  .  Smoking status: Never Smoker  . Smokeless tobacco: Never Used  Substance and Sexual Activity  . Alcohol use: No    Alcohol/week: 0.0 oz  . Drug use: No  . Sexual activity: Not on file  Lifestyle  . Physical activity:    Days per week: Not on file    Minutes per session: Not on file  . Stress: Not on file  Relationships  . Social connections:    Talks on phone: Not on file    Gets together: Not on file    Attends religious service: Not on file    Active member of club or organization: Not on file    Attends meetings of clubs or organizations: Not on file    Relationship status: Not on file  Other Topics Concern  . 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    Patient is single and lives alone.    Patient is right handed.     Family History  Problem Relation Age of Onset  . Coronary artery disease Mother   . Heart disease Mother   . Diabetes Mother   . Hypertension Mother   . Stroke Mother   . Alcohol abuse Father   . COPD Maternal Grandmother   . Hypertension Other   . Hyperlipidemia Other   . Diabetes Other   . Colon cancer Neg Hx     Review of Systems  Constitutional: Negative for fatigue and fever.    HENT: Negative for ear pain.   Respiratory: Negative for shortness of breath.   Cardiovascular: Negative for chest pain, palpitations and leg swelling.  Gastrointestinal: Negative for abdominal pain, blood in stool, constipation and diarrhea.  Neurological: Negative for headaches.       Objective:   Vitals:   03/29/18 0742  BP: 128/76  Pulse: 80  Resp: 16  Temp: 98.1 F (36.7 C)  SpO2: 98%   BP Readings from Last 3 Encounters:  03/29/18 128/76  01/23/18 122/80  01/08/18 133/87   Wt Readings from Last 3 Encounters:  03/29/18 215 lb (97.5 kg)  01/23/18 216 lb (98 kg)  01/08/18 214 lb (97.1 kg)   Body mass index is 35.78 kg/m.   Physical Exam    Constitutional: Appears well-developed and well-nourished. No distress.  HENT:  Head: Normocephalic and atraumatic.  Neck: Bilateral ear canals and tympanic membranes normal.  No excessive cerumen. neck supple. No tracheal deviation present. No thyromegaly present.  No cervical lymphadenopathy Cardiovascular: Normal rate, regular rhythm and normal heart sounds.   No murmur heard.  No edema Pulmonary/Chest: Effort normal and breath sounds normal. No respiratory distress. No has no wheezes. No rales.  Skin: Skin is warm and dry. Not diaphoretic.  Psychiatric: Normal mood and affect. Behavior is normal.      Assessment & Plan:    See Problem List for Assessment and Plan of chronic medical problems.

## 2018-03-29 ENCOUNTER — Encounter: Payer: Self-pay | Admitting: Internal Medicine

## 2018-03-29 ENCOUNTER — Ambulatory Visit: Payer: BC Managed Care – PPO | Admitting: Internal Medicine

## 2018-03-29 VITALS — BP 128/76 | HR 80 | Temp 98.1°F | Resp 16 | Wt 215.0 lb

## 2018-03-29 DIAGNOSIS — E669 Obesity, unspecified: Secondary | ICD-10-CM

## 2018-03-29 DIAGNOSIS — I10 Essential (primary) hypertension: Secondary | ICD-10-CM | POA: Diagnosis not present

## 2018-03-29 DIAGNOSIS — E611 Iron deficiency: Secondary | ICD-10-CM | POA: Diagnosis not present

## 2018-03-29 DIAGNOSIS — K219 Gastro-esophageal reflux disease without esophagitis: Secondary | ICD-10-CM

## 2018-03-29 MED ORDER — VITAMIN D (ERGOCALCIFEROL) 1.25 MG (50000 UNIT) PO CAPS: 50000.0000 [IU] | ORAL_CAPSULE | ORAL | 0 refills | Status: DC

## 2018-03-29 MED ORDER — HYDROCHLOROTHIAZIDE 25 MG PO TABS
25.0000 mg | ORAL_TABLET | Freq: Every day | ORAL | 3 refills | Status: DC
Start: 2018-03-29 — End: 2018-06-10

## 2018-03-31 NOTE — Assessment & Plan Note (Signed)
Continue iron supplementation

## 2018-03-31 NOTE — Assessment & Plan Note (Signed)
BP well controlled Current regimen effective and well tolerated Continue current medications at current doses  

## 2018-03-31 NOTE — Assessment & Plan Note (Signed)
GERD controlled Continue daily medication  

## 2018-03-31 NOTE — Assessment & Plan Note (Signed)
She is eating very well on a believe she continues that along with regular exercise she will slowly lose weight Discussed that the only way to make her abdomen smaller is to continue with her weight loss efforts No other concerning symptoms or signs of other causes for her larger abdomen

## 2018-04-01 NOTE — Progress Notes (Signed)
[place]  Telephone:(336) [phone] Fax:(336) [phone]     ID: [name] [name] DOB: 12/05/1960  MR#: [id number]  HQP#:[id number]  Patient Care Team: Binnie Rail, MD as PCP - General (Internal Medicine) [name], MD as Consulting Physician (Obstetrics and Gynecology) [name], MD (Gastroenterology) Ellwood Dense., MD (Neurology) [name], MD as Consulting Physician (General Surgery) Tennis Mckinnon, Virgie Dad, MD as Consulting Physician (Oncology) [name], MD as Attending Physician (Radiation Oncology) [name], RN as Registered Nurse [name], RN as Registered Nurse Jake Shark, Johny Blamer, NP as Nurse Practitioner (Nurse Practitioner) Lyndal Pulley, DO as Attending Physician (Family Medicine) OTHER MD:  CHIEF COMPLAINT: Triple negative breast cancer  CURRENT TREATMENT: Observation  BREAST CANCER HISTORY: From the original intake note:  [name] [name] had bilateral screening mammography at the [facility] 11/21/2013. Breast density was category C. Exam was unremarkable. In January 2016 however the patient palpated a change in her right breast laterally. She contacted her gynecologist, Dr. [name], and he set her up for a unilateral right diagnostic mammography with right breast ultrasonography at [facility] 10/15/2014. There was a focal irregular opacity in the lateral right breast associated with pleomorphic calcifications. This was palpable on exam as a firm non-mobile mass. Ultrasound confirmed a hypoechoic mass in the right breast at the 9:00 position 5 cm from the nipple, measuring 2.9 cm. Next to this mass was a normal-sized intramammary lymph node measuring 4 mm. In the right axilla there were 3 contiguous level I lymph nodes the largest measuring 11 mm.  On 10/20/2014 the patient underwent right core needle biopsy of the 9:00 breast mass, showing ([id number]) and invasive ductal carcinoma, grade 3, triple negative, with  the HER-2/neu signals ratio of 1.26 and number per cell 2.15. The MIB-1 was 71%. Biopsy of the largest right axillary lymph node was negative. Dr. Owens Shark the mammographer who performed a biopsy describes this is concordant.  Left breast mammography 10/26/2014 was negative, and bilateral breast MRI the same day confirmed, in the right breast, and upper outer quadrant mass measuring 2.5 cm, with a second mass measuring 1.4 cm slightly anterior and superior to it. In addition there was a total area of approximately 6.5 cm of non-masslike enhancement extending throughout the upper outer quadrant of the right breast. There was a cortically thickened right axillary lymph node which had been biopsied. There was also a mildly cortically thickened right subpectoral lymph node. There were no internal mammary or left axillary lymph nodes in the left breast was unremarkable.  The patient's subsequent history is as detailed below.  INTERVAL HISTORY: [name] [name] returns today for follow-up of her triple negative breast cancer. She continues under observation. The interval history is generally unremarkable. She denies any changes in her breast since her last visit.   Since her last visit, she underwent routine screening unilateral left breast mammography with CAD and tomography on 10/31/2017 at [facility] showing: breast density category C. There was no evidence of malignancy.   REVIEW OF SYSTEMS: [name] [name] reports that she works all year-round. She had a week off for the 4th of July, but she cleaned her house during her week off. 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 In addition to that, she walks 30 minutes everyday. Her family is doing well. Dr. [name] helps her with her multiple sclerosis. She denies unusual headaches, visual changes, nausea, vomiting, or dizziness. There has been no unusual cough, phlegm production, or pleurisy. This  been no change in bowel or bladder habits. She denies unexplained fatigue  or unexplained weight loss, bleeding, rash, or fever. A detailed review of systems was otherwise stable.    PAST MEDICAL HISTORY: Past Medical History:  Diagnosis Date  . Allergic rhinitis due to other allergen   . Allergy   . Angioedema    ACEI  . Anxiety   . Breast cancer of upper-outer quadrant of right female breast ([place]) 10/23/2014   s/p r mastectomy, neoadj chemo completed 04/21/15; neg genetic testing  . Colon polyps 05/2014   adenomatous, colo q 5y  . CONSTIPATION, CHRONIC   . GERD   . Herpes zoster without complication 7/85/8850  . Hot flashes   . HYPERLIPIDEMIA   . HYPERTENSION   . Lactose intolerance   . Migraine headache   . Multiple sclerosis ([place])   . OBESITY, TRUNCAL     PAST SURGICAL HISTORY: Past Surgical History:  Procedure Laterality Date  . BREAST BIOPSY Right    [place]   . COLONOSCOPY    . MASTECTOMY Right   . PORT-A-CATH REMOVAL Left 05/13/2015   Procedure: REMOVAL PORT-A-CATH;  Surgeon: [name], MD;  Location: [facility];  Service: General;  Laterality: Left;  . PORTACATH PLACEMENT Left 11/06/2014   Procedure: INSERTION PORT-A-CATH;  Surgeon: [name], MD;  Location: [facility];  Service: General;  Laterality: Left;  . SIMPLE MASTECTOMY WITH AXILLARY SENTINEL NODE BIOPSY Right 05/13/2015   Procedure: RIGHT TOTAL  MASTECTOMY WITH RIGHT  AXILLARY SENTINEL NODE BIOPSY;  Surgeon: [name], MD;  Location: [facility];  Service: General;  Laterality: Right;  . TUBAL LIGATION      FAMILY HISTORY Family History  Problem Relation Age of Onset  . Coronary artery disease Mother   . Heart disease Mother   . Diabetes Mother   . Hypertension Mother   . Stroke Mother   . Alcohol abuse Father   . COPD Maternal Grandmother   . Hypertension Other   . Hyperlipidemia Other   . Diabetes Other   . Colon cancer Neg Hx    the patient's father died at the age of 13 following his seizure. The patient's mother died from congestive heart failure  at the age of 2. The patient had 5 brothers and 2 sisters. One brother died from Escherichia coli infection, the other one had a history of seizure disorder. One sister died from causes unknown to the patient. There is no history of breast or ovarian cancer in the family.  GYNECOLOGIC HISTORY:  Patient's last menstrual period was 11/01/2012. Menarche age 64, first live birth age 10. The patient is GX P2. She stopped having periods in May 2014. She did not take hormone replacement. She did take her control pills remotely for approximately 4 years with no complications.  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 She describes herself is single, and lives by herself. Her daughter [name] lives in [place] and works in Ambulance person for [employer]. Son [place] "[name]" Gibeault is an Engineer, building services for [employer]. The patient has 1 grand son who graduated from high school June 2017    ADVANCED DIRECTIVES: Not in place   HEALTH MAINTENANCE: Social History   Tobacco Use  . Smoking status: Never Smoker  . Smokeless tobacco: Never Used  Substance Use Topics  . Alcohol use: No    Alcohol/week: 0.0 oz  . Drug use: No  Colonoscopy: September 2015/[name]  PAP: February 2015  Bone density:  Lipid panel:  Allergies  Allergen Reactions  . Ace Inhibitors Anaphylaxis     Angioedema (tongue swelling)  . Clarithromycin Anaphylaxis    Throat swells  . Contrave [Naltrexone-Bupropion Hcl Er]     Chest and sob  . Levaquin [Levofloxacin] Other (See Comments)    Tendon pain - shoulder and calf    Current Outpatient Medications  Medication Sig Dispense Refill  . ALPRAZolam (XANAX) 0.5 MG tablet Take 1 tablet (0.5 mg total) by mouth 2 (two) times daily as needed for anxiety or sleep. 90 tablet 0  . b complex vitamins tablet Take 1 tablet by mouth daily.    . budesonide-formoterol (SYMBICORT) 160-4.5 MCG/ACT inhaler  Inhale 2 puffs into the lungs 2 (two) times daily. 1 Inhaler 0  . Cholecalciferol (VITAMIN D) 2000 units CAPS Take 1 capsule (2,000 Units total) by mouth daily. 30 capsule   . COPAXONE 40 MG/ML SOSY 25m/ml SQ three times weekly 36 Syringe 3  . ferrous gluconate (FERGON) 324 MG tablet Take 1 tablet (324 mg total) by mouth 2 (two) times daily with a meal. (Patient taking differently: Take 324 mg by mouth 3 (three) times a week. ) 180 tablet 3  . fluticasone (FLONASE) 50 MCG/ACT nasal spray USE 2 SPRAYS IN EACH       NOSTRIL DAILY 16 g 6  . gabapentin (NEURONTIN) 100 MG capsule Take 3 capsules (300 mg total) by mouth 3 (three) times daily. 810 capsule 0  . hydrochlorothiazide (HYDRODIURIL) 25 MG tablet Take 1 tablet (25 mg total) by mouth daily. 90 tablet 3  . ibuprofen (ADVIL,MOTRIN) 800 MG tablet Take 1 tablet (800 mg total) by mouth every 8 (eight) hours as needed. 60 tablet 1  . LINZESS 145 MCG CAPS capsule TAKE 1 CAPSULE(145 MCG) BY MOUTH DAILY BEFORE BREAKFAST 90 capsule 1  . loratadine-pseudoephedrine (CLARITIN-D 12 HOUR) 5-120 MG tablet Take 1 tablet by mouth daily as needed (seasonal allergies). 30 tablet 3  . losartan (COZAAR) 50 MG tablet Take 1 tablet (50 mg total) by mouth 2 (two) times daily. 180 tablet 3  . metoprolol tartrate (LOPRESSOR) 25 MG tablet TAKE 1 TABLET BY MOUTH TWICE DAILY 180 tablet 3  . pantoprazole (PROTONIX) 40 MG tablet Take 1 tablet (40 mg total) by mouth daily. (Patient taking differently: Take 40 mg by mouth 3 (three) times a week. ) 90 tablet 2  . potassium chloride (MICRO-K) 10 MEQ CR capsule Take 2 capsules (20 mEq total) by mouth 2 (two) times daily. 360 capsule 1   No current facility-administered medications for this visit.     OBJECTIVE: Middle-aged African-American woman in no acute distress  Vitals:   04/02/18 0845  BP: 115/77  [name]: 79  Resp: 18  Temp: 98.2 F (36.8 C)  SpO2: 99%     Body mass index is 35.66 kg/m.    ECOG FS:0 - Asymptomatic     Sclerae unicteric, EOMs intact Oropharynx clear and moist No cervical or supraclavicular adenopathy Lungs no rales or rhonchi Heart regular rate and rhythm Abd soft, nontender, positive bowel sounds MSK no focal spinal tenderness, no upper extremity lymphedema Neuro: nonfocal, well oriented, appropriate affect Breasts: On the right the patient has undergone mastectomy.  There is no evidence of chest wall recurrence.  The left breast is benign.  Both axillae are benign.  LAB RESULTS:  CMP     Component Value Date/Time   NA 141 04/02/2018  0821   NA 142 04/05/2017 1040   K 4.7 04/02/2018 0821   K 4.5 04/05/2017 1040   CL 105 04/02/2018 0821   CO2 26 04/02/2018 0821   CO2 25 04/05/2017 1040   GLUCOSE 86 04/02/2018 0821   GLUCOSE 89 04/05/2017 1040   BUN 15 04/02/2018 0821   BUN 18.6 04/05/2017 1040   CREATININE 0.91 04/02/2018 0821   CREATININE 1.0 04/05/2017 1040   CALCIUM 9.8 04/02/2018 0821   CALCIUM 10.2 04/05/2017 1040   PROT 7.5 04/02/2018 0821   PROT 7.7 04/05/2017 1040   ALBUMIN 4.3 04/02/2018 0821   ALBUMIN 4.2 04/05/2017 1040   AST 17 04/02/2018 0821   AST 17 04/05/2017 1040   ALT 16 04/02/2018 0821   ALT 17 04/05/2017 1040   ALKPHOS 88 04/02/2018 0821   ALKPHOS 101 04/05/2017 1040   BILITOT 0.5 04/02/2018 0821   BILITOT 0.56 04/05/2017 1040   GFRNONAA >60 04/02/2018 0821   GFRAA >60 04/02/2018 0821    INo results found for: SPEP, UPEP  Lab Results  Component Value Date   WBC 5.7 04/02/2018   NEUTROABS 2.5 04/02/2018   HGB 12.4 04/02/2018   HCT 36.0 04/02/2018   MCV 87.1 04/02/2018   PLT 238 04/02/2018      Chemistry      Component Value Date/Time   NA 141 04/02/2018 0821   NA 142 04/05/2017 1040   K 4.7 04/02/2018 0821   K 4.5 04/05/2017 1040   CL 105 04/02/2018 0821   CO2 26 04/02/2018 0821   CO2 25 04/05/2017 1040   BUN 15 04/02/2018 0821   BUN 18.6 04/05/2017 1040   CREATININE 0.91 04/02/2018 0821   CREATININE 1.0 04/05/2017 1040       Component Value Date/Time   CALCIUM 9.8 04/02/2018 0821   CALCIUM 10.2 04/05/2017 1040   ALKPHOS 88 04/02/2018 0821   ALKPHOS 101 04/05/2017 1040   AST 17 04/02/2018 0821   AST 17 04/05/2017 1040   ALT 16 04/02/2018 0821   ALT 17 04/05/2017 1040   BILITOT 0.5 04/02/2018 0821   BILITOT 0.56 04/05/2017 1040       No results found for: LABCA2  No components found for: LABCA125  No results for input(s): INR in the last 168 hours.  Urinalysis    Component Value Date/Time   COLORURINE YELLOW 05/30/2016 1619   APPEARANCEUR CLEAR 05/30/2016 1619   LABSPEC 1.010 05/30/2016 1619   PHURINE 6.0 05/30/2016 1619   GLUCOSEU NEGATIVE 05/30/2016 1619   HGBUR NEGATIVE 05/30/2016 1619   HGBUR negative 08/19/2010 [place] 05/30/2016 1619   KETONESUR NEGATIVE 05/30/2016 1619   UROBILINOGEN 0.2 05/30/2016 1619   NITRITE NEGATIVE 05/30/2016 1619   LEUKOCYTESUR NEGATIVE 05/30/2016 1619    STUDIES: Since her last visit, she underwent routine screening unilateral left breast mammography with CAD and tomography on 10/31/2017 at [facility] showing: breast density category C. There was no evidence of malignancy.   ASSESSMENT: 57 y.o. 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   2) neoadjuvant chemotherapy consisting of cyclophosphamide and doxorubicin in dose dense fashion 4 given with Neulasta support, completed 01/06/2015, followed by weekly carboplatin and  paclitaxel 12 completed 04/21/2015  (3) status post right mastectomy and sentinel lymph node sampling 05/13/2015 for a ypT1c ypN0 invasive ductal carcinoma, high-grade, with ample margins.  (4) adjuvant radiation not necessary  PLAN: [name] [name] is now 3 years out from definitive surgery for her breast cancer with no evidence of disease recurrence.  This is very favorable.  We continue to follow her on a yearly basis and she will see me again in August 2020, with labs same day.  I reassured her that while she does have a chance of recurrence and also a small chance of developing a new breast cancer in the remaining breast, her overall prognosis is good  I commended her for exercise program and suggest that she could improve it by going to 45 minutes a day instead of 3.  She knows to call for any other issues that may develop before the next visit.  Demetre Monaco, Virgie Dad, MD  04/02/18 9:22 AM Medical Oncology and Hematology [facility] [address] [place],  [address] Tel. [phone]    Fax. [phone]  Alice Rieger, am acting as scribe for [name] MD.  I, Lurline Del MD, have reviewed the above documentation for accuracy and completeness, and I agree with the above.

## 2018-04-02 ENCOUNTER — Inpatient Hospital Stay: Payer: BC Managed Care – PPO | Attending: Oncology

## 2018-04-02 ENCOUNTER — Inpatient Hospital Stay (HOSPITAL_BASED_OUTPATIENT_CLINIC_OR_DEPARTMENT_OTHER): Payer: BC Managed Care – PPO | Admitting: Oncology

## 2018-04-02 VITALS — BP 115/77 | HR 79 | Temp 98.2°F | Resp 18 | Ht 65.0 in | Wt 214.3 lb

## 2018-04-02 DIAGNOSIS — I10 Essential (primary) hypertension: Secondary | ICD-10-CM | POA: Diagnosis not present

## 2018-04-02 DIAGNOSIS — Z170 Estrogen receptor positive status [ER+]: Secondary | ICD-10-CM | POA: Diagnosis not present

## 2018-04-02 DIAGNOSIS — Z171 Estrogen receptor negative status [ER-]: Secondary | ICD-10-CM | POA: Diagnosis not present

## 2018-04-02 DIAGNOSIS — E785 Hyperlipidemia, unspecified: Secondary | ICD-10-CM | POA: Diagnosis not present

## 2018-04-02 DIAGNOSIS — Z9011 Acquired absence of right breast and nipple: Secondary | ICD-10-CM | POA: Diagnosis not present

## 2018-04-02 DIAGNOSIS — C50411 Malignant neoplasm of upper-outer quadrant of right female breast: Secondary | ICD-10-CM | POA: Diagnosis not present

## 2018-04-02 DIAGNOSIS — Z79899 Other long term (current) drug therapy: Secondary | ICD-10-CM | POA: Insufficient documentation

## 2018-04-02 DIAGNOSIS — K219 Gastro-esophageal reflux disease without esophagitis: Secondary | ICD-10-CM | POA: Diagnosis not present

## 2018-04-02 DIAGNOSIS — G35 Multiple sclerosis: Secondary | ICD-10-CM | POA: Diagnosis not present

## 2018-04-02 DIAGNOSIS — Z853 Personal history of malignant neoplasm of breast: Secondary | ICD-10-CM | POA: Diagnosis present

## 2018-04-02 LAB — COMPREHENSIVE METABOLIC PANEL
ALK PHOS: 88 U/L (ref 38–126)
ALT: 16 U/L (ref 0–44)
AST: 17 U/L (ref 15–41)
Albumin: 4.3 g/dL (ref 3.5–5.0)
Anion gap: 10 (ref 5–15)
BUN: 15 mg/dL (ref 6–20)
CO2: 26 mmol/L (ref 22–32)
CREATININE: 0.91 mg/dL (ref 0.44–1.00)
Calcium: 9.8 mg/dL (ref 8.9–10.3)
Chloride: 105 mmol/L (ref 98–111)
GFR calc Af Amer: >60 mL/min (ref >60)
Glucose, Bld: 86 mg/dL (ref 70–99)
Potassium: 4.7 mmol/L (ref 3.5–5.1)
Sodium: 141 mmol/L (ref 135–145)
Total Bilirubin: 0.5 mg/dL (ref 0.3–1.2)
Total Protein: 7.5 g/dL (ref 6.5–8.1)

## 2018-04-02 LAB — CBC WITH DIFFERENTIAL/PLATELET
BASOS ABS: 0 10*3/uL (ref 0.0–0.1)
Basophils Relative: 1 %
EOS ABS: 0.2 10*3/uL (ref 0.0–0.5)
EOS PCT: 4 %
HCT: 36 % (ref 34.8–46.6)
Hemoglobin: 12.4 g/dL (ref 11.6–15.9)
Lymphocytes Relative: 42 %
Lymphs Abs: 2.4 10*3/uL (ref 0.9–3.3)
MCH: 29.9 pg (ref 25.1–34.0)
MCHC: 34.3 g/dL (ref 31.5–36.0)
MCV: 87.1 fL (ref 79.5–101.0)
Monocytes Absolute: 0.5 10*3/uL (ref 0.1–0.9)
Monocytes Relative: 9 %
Neutro Abs: 2.5 10*3/uL (ref 1.5–6.5)
Neutrophils Relative %: 44 %
PLATELETS: 238 10*3/uL (ref 145–400)
RBC: 4.14 MIL/uL (ref 3.70–5.45)
RDW: 15.3 % — AB (ref 11.2–14.5)
WBC: 5.7 10*3/uL (ref 3.9–10.3)

## 2018-04-03 ENCOUNTER — Telehealth: Payer: Self-pay | Admitting: Oncology

## 2018-04-03 NOTE — Telephone Encounter (Signed)
Per 7/16 los mailed calendar

## 2018-04-04 ENCOUNTER — Encounter: Payer: Self-pay | Admitting: Internal Medicine

## 2018-04-04 MED ORDER — PHENTERMINE HCL 37.5 MG PO CAPS: 37.5000 mg | ORAL_CAPSULE | ORAL | 0 refills | Status: DC

## 2018-04-08 ENCOUNTER — Encounter: Payer: Self-pay | Admitting: Internal Medicine

## 2018-04-08 ENCOUNTER — Encounter: Payer: Self-pay | Admitting: *Deleted

## 2018-04-10 ENCOUNTER — Encounter: Payer: Self-pay | Admitting: Adult Health

## 2018-04-23 ENCOUNTER — Telehealth: Payer: Self-pay | Admitting: Nurse Practitioner

## 2018-04-23 NOTE — Telephone Encounter (Signed)
[name] [name] with [facility] called stating a PA for COPAXONE 40 MG/ML SOSY is needed. Stating the RN can call [phone] to start the PA case [phone]

## 2018-04-24 NOTE — Telephone Encounter (Signed)
Spoke to [employer].  Need to use CMM for initial PA  Key #  [id number] .  Initiated 04-23-18.  Received PA approval this am via fax  Copaxone 40mg  04-23-18 thru 04-23-2020.  [place].

## 2018-05-01 ENCOUNTER — Telehealth: Payer: Self-pay | Admitting: Emergency Medicine

## 2018-05-01 NOTE — Telephone Encounter (Signed)
Pt states Linzess was working in the beginning but does not seem to be working as good now. Can pt increase dose or change to alternative.

## 2018-05-01 NOTE — Telephone Encounter (Signed)
We can try doubling the dose to 290 mcg daily or try amitiza ( not sure if she has tried that yet or not) -- may be worth first trying the 290 mcg of Linzess daily and then try the second medication if needed

## 2018-05-02 NOTE — Telephone Encounter (Signed)
Spoke with pt to inform. She will let us know if it does not seem to work.

## 2018-05-14 ENCOUNTER — Other Ambulatory Visit: Payer: Self-pay | Admitting: Internal Medicine

## 2018-06-09 NOTE — Progress Notes (Signed)
Subjective:    Patient ID: [name] [name], female    DOB: 12/28/1960, 57 y.o.   MRN: [id number]  HPI The patient is here for an acute visit.   Ear pain:  Her symptoms started a few days ago.  She has nasal congestion, sinus pressure, sore throat, light green mucus, headaches, minimal cough.  She has taken BC powder.  She takes her allergy medications daily.  She has not needed her inhaler.    Stress:  Her job is very stressful.  She has new management and that is causing the stress.  She has the xanax and rarely used to take it but has been taking it more.  She prefers not to take anything.  She just moved and plans on starting to exercising and is hoping that will help as well.  She does not want medication other than the occasional xanax.  She is also finding other ways of dealing with the stress that is more natural.    Hypertension: She is taking her medication daily. She is compliant with a low sodium diet.  She denies chest pain, palpitations, edema, shortness of breath and regular headaches. She is not exercising regularly.      Medications and allergies reviewed with patient and updated if appropriate.  Patient Active Problem List   Diagnosis Date Noted  . Ear canal dryness 01/23/2018  . Trigger finger 01/23/2018  . Meralgia paraesthetica, right 11/05/2017  . Iron deficiency 10/09/2017  . Vitamin D deficiency 06/15/2017  . LUQ cramping 04/21/2017  . Lumbar radiculopathy 04/21/2017  . Trigger index finger of left hand 04/21/2017  . Obesity (BMI 35.0-39.9 without comorbidity) 04/21/2017  . Left hip pain 10/31/2016  . Pes planus 10/31/2016  . Posterior tibialis muscle dysfunction 10/31/2016  . Depression 09/03/2016  . Memory difficulties 09/03/2016  . Breast pain, right 08/24/2016  . Palpitations 04/04/2016  . 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 Malignant neoplasm of upper-outer quadrant of right breast in female, 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[place]) 11/03/2015  . S/P mastectomy  05/13/2015  . Chemotherapy-induced neuropathy ([place]) 04/21/2015  . Genetic testing 12/11/2014  . Breast cancer of upper-outer quadrant of right female breast ([place]) 10/23/2014  . Anxiety 04/29/2012  . Allergic rhinitis 11/29/2009  . 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 CONSTIPATION, CHRONIC 08/17/2008  . Dyslipidemia 04/24/2008  . METABOLIC SYNDROME X 28/36/6294  . GERD 01/07/2008  . Essential hypertension 09/20/2007  . Multiple sclerosis ([place]) 09/18/2007    Current Outpatient Medications on File Prior to Visit  Medication Sig Dispense Refill  . ALPRAZolam (XANAX) 0.5 MG tablet Take 1 tablet (0.5 mg total) by mouth 2 (two) times daily as needed for anxiety or sleep. 90 tablet 0  . b complex vitamins tablet Take 1 tablet by mouth daily.    . budesonide-formoterol (SYMBICORT) 160-4.5 MCG/ACT inhaler Inhale 2 puffs into the lungs 2 (two) times daily. 1 Inhaler 0  . Cholecalciferol (VITAMIN D) 2000 units CAPS Take 1 capsule (2,000 Units total) by mouth daily. 30 capsule   . COPAXONE 40 MG/ML SOSY 40mg /ml SQ three times weekly 36 Syringe 3  . ferrous gluconate (FERGON) 324 MG tablet Take 1 tablet (324 mg total) by mouth 2 (two) times daily with a meal. (Patient taking differently: Take 324 mg by mouth 3 (three) times a week. ) 180 tablet 3  . fluticasone (FLONASE) 50 MCG/ACT nasal spray USE 2 SPRAYS IN EACH       NOSTRIL DAILY 16 g 6  . gabapentin (NEURONTIN) 100 MG capsule  Take 3 capsules (300 mg total) by mouth 3 (three) times daily. 810 capsule 0  . hydrochlorothiazide (HYDRODIURIL) 25 MG tablet Take 1 tablet (25 mg total) by mouth daily. 90 tablet 3  . ibuprofen (ADVIL,MOTRIN) 800 MG tablet Take 1 tablet (800 mg total) by mouth every 8 (eight) hours as needed. 60 tablet 1  . LINZESS 145 MCG CAPS capsule TAKE 1 CAPSULE(145 MCG) BY MOUTH DAILY BEFORE BREAKFAST 90 capsule 1  . loratadine-pseudoephedrine (CLARITIN-D 12 HOUR) 5-120 MG tablet Take 1 tablet by mouth daily as needed (seasonal allergies). 30 tablet  3  . losartan (COZAAR) 50 MG tablet Take 1 tablet (50 mg total) by mouth 2 (two) times daily. 180 tablet 3  . metoprolol tartrate (LOPRESSOR) 25 MG tablet TAKE 1 TABLET BY MOUTH TWICE DAILY 180 tablet 3  . pantoprazole (PROTONIX) 40 MG tablet TAKE 1 TABLET BY MOUTH EVERY DAY 90 tablet 2  . phentermine 37.5 MG capsule Take 1 capsule (37.5 mg total) by mouth every morning. 30 capsule 0  . potassium chloride (MICRO-K) 10 MEQ CR capsule Take 2 capsules (20 mEq total) by mouth 2 (two) times daily. 360 capsule 1  . [DISCONTINUED] mometasone (NASONEX) 50 MCG/ACT nasal spray Place 2 sprays into the nose daily. 17 g 2   No current facility-administered medications on file prior to visit.     Past Medical History:  Diagnosis Date  . Allergic rhinitis due to other allergen   . Allergy   . Angioedema    ACEI  . Anxiety   . Breast cancer of upper-outer quadrant of right female breast ([place]) 10/23/2014   s/p r mastectomy, neoadj chemo completed 04/21/15; neg genetic testing  . Colon polyps 05/2014   adenomatous, colo q 5y  . CONSTIPATION, CHRONIC   . GERD   . Herpes zoster without complication 6/57/8469  . Hot flashes   . HYPERLIPIDEMIA   . HYPERTENSION   . Lactose intolerance   . Migraine headache   . Multiple sclerosis ([place])   . OBESITY, TRUNCAL     Past Surgical History:  Procedure Laterality Date  . BREAST BIOPSY Right    [place]   . COLONOSCOPY    . MASTECTOMY Right   . PORT-A-CATH REMOVAL Left 05/13/2015   Procedure: REMOVAL PORT-A-CATH;  Surgeon: [name], MD;  Location: [facility];  Service: General;  Laterality: Left;  . PORTACATH PLACEMENT Left 11/06/2014   Procedure: INSERTION PORT-A-CATH;  Surgeon: [name], MD;  Location: [facility];  Service: General;  Laterality: Left;  . SIMPLE MASTECTOMY WITH AXILLARY SENTINEL NODE BIOPSY Right 05/13/2015   Procedure: RIGHT TOTAL  MASTECTOMY WITH RIGHT  AXILLARY SENTINEL NODE BIOPSY;  Surgeon: [name], MD;   Location: [facility];  Service: General;  Laterality: Right;  . TUBAL LIGATION      Social History   Socioeconomic History  . Marital status: Single    Spouse name: Not on file  . Number of children: 2  . Years of education: 16  . Highest education level: Not on file  Occupational History  . Occupation: Solicitor: [employer]  Social Needs  . Financial resource strain: Not on file  . Food insecurity:    Worry: Not on file    Inability: Not on file  . Transportation needs:    Medical: Not on file    Non-medical: Not on file  Tobacco Use  . Smoking status: Never Smoker  . Smokeless tobacco: Never Used  Substance and Sexual Activity  . Alcohol use: No    Alcohol/week: 0.0 standard drinks  . Drug use: No  . Sexual activity: Not on file  Lifestyle  . Physical activity:    Days per week: Not on file    Minutes per session: Not on file  . Stress: Not on file  Relationships  . Social connections:    Talks on phone: Not on file    Gets together: Not on file    Attends religious service: Not on file    Active member of club or organization: Not on file    Attends meetings of clubs or organizations: Not on file    Relationship status: Not on file  Other Topics Concern  . 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    Patient is single and lives alone.    Patient is right handed.     Family History  Problem Relation Age of Onset  . Coronary artery disease Mother   . Heart disease Mother   . Diabetes Mother   . Hypertension Mother   . Stroke Mother   . Alcohol abuse Father   . COPD Maternal Grandmother   . Hypertension Other   . Hyperlipidemia Other   . Diabetes Other   . Colon cancer Neg Hx     Review of Systems  Constitutional: Negative for chills and fever.  HENT: Positive for congestion, ear pain (right ear), sinus pain and sore throat.     Respiratory: Positive for cough (mild). Negative for shortness of breath and wheezing.   Cardiovascular: Negative for chest pain, palpitations and leg swelling.  Gastrointestinal: Negative for diarrhea and nausea.  Musculoskeletal: Negative for myalgias.  Neurological: Positive for headaches (mild). Negative for dizziness and light-headedness.       Objective:   Vitals:   06/10/18 0748  BP: 124/82  Pulse: 81  Resp: 16  Temp: 99.2 F (37.3 C)  SpO2: 97%   BP Readings from Last 3 Encounters:  06/10/18 124/82  04/02/18 115/77  03/29/18 128/76   Wt Readings from Last 3 Encounters:  06/10/18 211 lb 12.8 oz (96.1 kg)  04/02/18 214 lb 4.8 oz (97.2 kg)  03/29/18 215 lb (97.5 kg)   Body mass index is 35.25 kg/m.   Physical Exam    GENERAL APPEARANCE: Appears stated age, well appearing, NAD EYES: conjunctiva clear, no icterus HEENT: bilateral tympanic membranes and ear canals normal, oropharynx with mild erythema, maxillary sinus tenderness b/l, no thyromegaly, trachea midline, no cervical or supraclavicular lymphadenopathy LUNGS: Clear to auscultation without wheeze or crackles, unlabored breathing, good air entry bilaterally CARDIOVASCULAR: Normal S1,S2 without murmurs, no edema SKIN: Warm, dry PSYCH: normal mood and affect      Assessment & Plan:    See Problem List for Assessment and Plan of chronic medical problems.

## 2018-06-10 ENCOUNTER — Ambulatory Visit: Payer: BC Managed Care – PPO | Admitting: Internal Medicine

## 2018-06-10 ENCOUNTER — Encounter: Payer: Self-pay | Admitting: Internal Medicine

## 2018-06-10 VITALS — BP 124/82 | HR 81 | Temp 99.2°F | Resp 16 | Ht 65.0 in | Wt 211.8 lb

## 2018-06-10 DIAGNOSIS — J0100 Acute maxillary sinusitis, unspecified: Secondary | ICD-10-CM | POA: Diagnosis not present

## 2018-06-10 DIAGNOSIS — I10 Essential (primary) hypertension: Secondary | ICD-10-CM

## 2018-06-10 DIAGNOSIS — F419 Anxiety disorder, unspecified: Secondary | ICD-10-CM

## 2018-06-10 MED ORDER — HYDROCHLOROTHIAZIDE 25 MG PO TABS: 25.0000 mg | ORAL_TABLET | Freq: Every day | ORAL | 3 refills | Status: DC

## 2018-06-10 MED ORDER — LINACLOTIDE 145 MCG PO CAPS: ORAL_CAPSULE | ORAL | 1 refills | Status: DC

## 2018-06-10 MED ORDER — AZITHROMYCIN 250 MG PO TABS: ORAL_TABLET | ORAL | 0 refills | Status: DC

## 2018-06-10 MED ORDER — LOSARTAN POTASSIUM 50 MG PO TABS: 50.0000 mg | ORAL_TABLET | Freq: Two times a day (BID) | ORAL | 3 refills | Status: DC

## 2018-06-10 NOTE — Assessment & Plan Note (Signed)
Increased stress from work - management changes Taking xanax as needed, but more often than in the past - still not daily Declines daily medication Will start exercising and finding other ways to deal with stress - wants to avoid medication

## 2018-06-10 NOTE — Patient Instructions (Addendum)
Take the antibiotic as prescribed.  Continue your allergy medications.  Use the inhaler if needed.     Call if no improvement      Sinusitis, Adult Sinusitis is soreness and inflammation of your sinuses. Sinuses are hollow spaces in the bones around your face. Your sinuses are located:  Around your eyes.  In the middle of your forehead.  Behind your nose.  In your cheekbones.  Your sinuses and nasal passages are lined with a stringy fluid (mucus). Mucus normally drains out of your sinuses. When your nasal tissues become inflamed or swollen, the mucus can become trapped or blocked so air cannot flow through your sinuses. This allows bacteria, viruses, and funguses to grow, which leads to infection. Sinusitis can develop quickly and last for 7?10 days (acute) or for more than 12 weeks (chronic). Sinusitis often develops after a cold. What are the causes? This condition is caused by anything that creates swelling in the sinuses or stops mucus from draining, including:  Allergies.  Asthma.  Bacterial or viral infection.  Abnormally shaped bones between the nasal passages.  Nasal growths that contain mucus (nasal polyps).  Narrow sinus openings.  Pollutants, such as chemicals or irritants in the air.  A foreign object stuck in the nose.  A fungal infection. This is rare.  What increases the risk? The following factors may make you more likely to develop this condition:  Having allergies or asthma.  Having had a recent cold or respiratory tract infection.  Having structural deformities or blockages in your nose or sinuses.  Having a weak immune system.  Doing a lot of swimming or diving.  Overusing nasal sprays.  Smoking.  What are the signs or symptoms? The main symptoms of this condition are pain and a feeling of pressure around the affected sinuses. Other symptoms include:  Upper toothache.  Earache.  Headache.  Bad breath.  Decreased sense of smell  and taste.  A cough that may get worse at night.  Fatigue.  Fever.  Thick drainage from your nose. The drainage is often green and it may contain pus (purulent).  Stuffy nose or congestion.  Postnasal drip. This is when extra mucus collects in the throat or back of the nose.  Swelling and warmth over the affected sinuses.  Sore throat.  Sensitivity to light.  How is this diagnosed? This condition is diagnosed based on symptoms, a medical history, and a physical exam. To find out if your condition is acute or chronic, your health care provider may:  Look in your nose for signs of nasal polyps.  Tap over the affected sinus to check for signs of infection.  View the inside of your sinuses using an imaging device that has a light attached (endoscope).  If your health care provider suspects that you have chronic sinusitis, you may also:  Be tested for allergies.  Have a sample of mucus taken from your nose (nasal culture) and checked for bacteria.  Have a mucus sample examined to see if your sinusitis is related to an allergy.  If your sinusitis does not respond to treatment and it lasts longer than 8 weeks, you may have an MRI or CT scan to check your sinuses. These scans also help to determine how severe your infection is. In rare cases, a bone biopsy may be done to rule out more serious types of fungal sinus disease. How is this treated? Treatment for sinusitis depends on the cause and whether your condition is chronic  or acute. If a virus is causing your sinusitis, your symptoms will go away on their own within 10 days. You may be given medicines to relieve your symptoms, including:  Topical nasal decongestants. They shrink swollen nasal passages and let mucus drain from your sinuses.  Antihistamines. These drugs block inflammation that is triggered by allergies. This can help to ease swelling in your nose and sinuses.  Topical nasal corticosteroids. These are nasal  sprays that ease inflammation and swelling in your nose and sinuses.  Nasal saline washes. These rinses can help to get rid of thick mucus in your nose.  If your condition is caused by bacteria, you will be given an antibiotic medicine. If your condition is caused by a fungus, you will be given an antifungal medicine. Surgery may be needed to correct underlying conditions, such as narrow nasal passages. Surgery may also be needed to remove polyps. Follow these instructions at home: Medicines  Take, use, or apply over-the-counter and prescription medicines only as told by your health care provider. These may include nasal sprays.  If you were prescribed an antibiotic medicine, take it as told by your health care provider. Do not stop taking the antibiotic even if you start to feel better. Hydrate and Humidify  Drink enough water to keep your urine clear or pale yellow. Staying hydrated will help to thin your mucus.  Use a cool mist humidifier to keep the humidity level in your home above 50%.  Inhale steam for 10-15 minutes, 3-4 times a day or as told by your health care provider. You can do this in the bathroom while a hot shower is running.  Limit your exposure to cool or dry air. Rest  Rest as much as possible.  Sleep with your head raised (elevated).  Make sure to get enough sleep each night. General instructions  Apply a warm, moist washcloth to your face 3-4 times a day or as told by your health care provider. This will help with discomfort.  Wash your hands often with soap and water to reduce your exposure to viruses and other germs. If soap and water are not available, use hand sanitizer.  Do not smoke. Avoid being around people who are smoking (secondhand smoke).  Keep all follow-up visits as told by your health care provider. This is important. Contact a health care provider if:  You have a fever.  Your symptoms get worse.  Your symptoms do not improve within 10  days. Get help right away if:  You have a severe headache.  You have persistent vomiting.  You have pain or swelling around your face or eyes.  You have vision problems.  You develop confusion.  Your neck is stiff.  You have trouble breathing. This information is not intended to replace advice given to you by your health care provider. Make sure you discuss any questions you have with your health care provider. Document Released: 09/04/2005 Document Revised: 04/30/2016 Document Reviewed: 06/30/2015 Elsevier Interactive Patient Education  [employer].

## 2018-06-10 NOTE — Assessment & Plan Note (Signed)
Likely bacterial  Start zpak otc cold medications/ allergy medications Rest, fluid Call if no improvement

## 2018-06-10 NOTE — Assessment & Plan Note (Signed)
BP well controlled Current regimen effective and well tolerated Continue current medications at current doses  

## 2018-06-18 ENCOUNTER — Telehealth: Payer: Self-pay | Admitting: Nurse Practitioner

## 2018-06-18 ENCOUNTER — Telehealth: Payer: Self-pay | Admitting: *Deleted

## 2018-06-18 NOTE — Telephone Encounter (Signed)
Pt is having joint pain in the right arm and hand for the past week. She is wanting a steroid called in. Pharmacy: [facility]/ [place]. Please call to advise

## 2018-06-18 NOTE — Telephone Encounter (Signed)
LMVM for pt that returned call.  °

## 2018-06-18 NOTE — Telephone Encounter (Signed)
Notified pt w/MD response.../lmb 

## 2018-06-18 NOTE — Telephone Encounter (Signed)
If this is MS related she needs to call neurology - they should be aware of what is going on and fill her prednisone.

## 2018-06-18 NOTE — Telephone Encounter (Signed)
Receive call pt states she is having tingley/pain feeling in her hands again from her MS. Requesting MD to refill her prednisone.Marland KitchenJohny Chess

## 2018-06-19 MED ORDER — PREDNISONE 10 MG PO TABS: ORAL_TABLET | ORAL | 0 refills | Status: DC

## 2018-06-19 NOTE — Telephone Encounter (Signed)
I called pt.  She states that she has sx for about 7-8 days now of numbness/ tingling,  bilateral now of fingers, hands, arms.  She states she gets this every yr or so.  (the last time 1.9yrs ago).  She feels like this is an MS exacerbation.  She has no other symptoms.  She would like to have prednisone dose pack.  She stated she cannot afford to come in for evaluation.  Please advise.

## 2018-06-19 NOTE — Telephone Encounter (Signed)
Spoke to pt and relayed that the prednisone dose pack 6 day taper was called to pharmacy by CM/NP.  Pt verbalized understanding.  I instructed on common SE of prednisone. (increased appetite, energy, and insomnia).  She was aware.  Appreciated this.  Will let us know how she does.

## 2018-06-19 NOTE — Telephone Encounter (Signed)
6-day Dosepak called to pharmacy

## 2018-06-24 ENCOUNTER — Ambulatory Visit: Payer: BC Managed Care – PPO

## 2018-06-24 ENCOUNTER — Ambulatory Visit (INDEPENDENT_AMBULATORY_CARE_PROVIDER_SITE_OTHER): Payer: BC Managed Care – PPO | Admitting: *Deleted

## 2018-06-24 ENCOUNTER — Ambulatory Visit: Payer: BC Managed Care – PPO | Admitting: Internal Medicine

## 2018-06-24 DIAGNOSIS — Z23 Encounter for immunization: Secondary | ICD-10-CM

## 2018-06-24 NOTE — Telephone Encounter (Signed)
Just an FYI  Patient called in and just wanted you to know that the prednisone is working.

## 2018-06-24 NOTE — Progress Notes (Signed)
FLU

## 2018-07-12 NOTE — Progress Notes (Signed)
FMLA successfully faxed to [employer] at [phone]. Copy taken to Ms. Wilma for pick up per patient request.

## 2018-07-14 ENCOUNTER — Other Ambulatory Visit: Payer: Self-pay | Admitting: Internal Medicine

## 2018-07-16 ENCOUNTER — Encounter: Payer: Self-pay | Admitting: Internal Medicine

## 2018-07-17 NOTE — Telephone Encounter (Signed)
Rec'd call from pt stating she would like to try the phentermine again. She did not take med when MD rx back in July. Pls advise.Marland KitchenJohny Chess

## 2018-07-18 ENCOUNTER — Other Ambulatory Visit: Payer: Self-pay | Admitting: Internal Medicine

## 2018-07-18 DIAGNOSIS — G5711 Meralgia paresthetica, right lower limb: Secondary | ICD-10-CM

## 2018-07-20 MED ORDER — PHENTERMINE HCL 37.5 MG PO CAPS: 37.5000 mg | ORAL_CAPSULE | ORAL | 0 refills | Status: DC

## 2018-07-21 ENCOUNTER — Other Ambulatory Visit: Payer: Self-pay | Admitting: Family Medicine

## 2018-07-21 ENCOUNTER — Other Ambulatory Visit: Payer: Self-pay | Admitting: Family

## 2018-07-21 ENCOUNTER — Other Ambulatory Visit: Payer: Self-pay | Admitting: Internal Medicine

## 2018-07-22 ENCOUNTER — Telehealth: Payer: Self-pay | Admitting: Internal Medicine

## 2018-07-22 NOTE — Telephone Encounter (Signed)
Copied from [facility] [id number]. Topic: Quick Communication - See Telephone Encounter >> Jul 22, 2018  1:50 PM Sheran Luz wrote: CRM for notification. See Telephone encounter for: 07/22/18.  Pt states that she was advised by pharmacy to contact office regarding phentermine 37.5 MG capsule [[id number]] stating that it needs prior auth.    Pharmacy:[id number] - [place], [place]

## 2018-07-23 NOTE — Telephone Encounter (Signed)
PA has been initiated for phetermine. Waiting on response. I sent my chart message to pt letting her know.   KEY:[id number]

## 2018-08-04 NOTE — Progress Notes (Signed)
Subjective:    Patient ID: [name] [name], female    DOB: 11-11-1960, 57 y.o.   MRN: [id number]  HPI The patient is here for an acute visit.   Sleep difficulties, fatigue:  She is having difficulty falling asleep or staying asleep.  This is not a new problem.  She has tried several things over-the-counter nothing has worked well.  She would prefer not to have to take any medication, but is fatigued and tired of not sleeping.  We had discussed possibly trying trazodone and she thinks she would like to try that since that is the safest medication.  Conjunctivitis of her eyes earlier not white: She is concerned that the whites of her eyes are not white-or his weight is they should be.  She had someone at the nutrition store tell her that she could have a problem with her liver.  She denies abdominal pain.    Vitamin D deficiency: She is taking her vitamin D daily, but is concerned if she is taking enough or too much.  She currently takes 2000 units daily.  She does have 5000 units that she can start taking if needed.  Numbness/tingling, neuropathy related to chemotherapy: She is taking a B complex daily.  She wonders if she is taking enough.  Hypertension: She is taking her medication daily as prescribed.  She is working on eating better and working on weight loss.  She is not currently exercising regularly.  Medications and allergies reviewed with patient and updated if appropriate.  Patient Active Problem List   Diagnosis Date Noted  . Ear canal dryness 01/23/2018  . Trigger finger 01/23/2018  . Meralgia paraesthetica, right 11/05/2017  . Iron deficiency 10/09/2017  . Vitamin D deficiency 06/15/2017  . LUQ cramping 04/21/2017  . Lumbar radiculopathy 04/21/2017  . Trigger index finger of left hand 04/21/2017  . Obesity (BMI 35.0-39.9 without comorbidity) 04/21/2017  . Acute non-recurrent maxillary sinusitis 01/08/2017  . Left hip pain 10/31/2016  . Pes planus 10/31/2016  .  Posterior tibialis muscle dysfunction 10/31/2016  . Depression 09/03/2016  . Memory difficulties 09/03/2016  . Breast pain, right 08/24/2016  . Palpitations 04/04/2016  . 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 Malignant neoplasm of upper-outer quadrant of right breast in female, 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[place]) 11/03/2015  . S/P mastectomy 05/13/2015  . Chemotherapy-induced neuropathy ([place]) 04/21/2015  . Genetic testing 12/11/2014  . Breast cancer of upper-outer quadrant of right female breast ([place]) 10/23/2014  . Anxiety 04/29/2012  . Allergic rhinitis 11/29/2009  . 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 CONSTIPATION, CHRONIC 08/17/2008  . Dyslipidemia 04/24/2008  . METABOLIC SYNDROME X 96/28/3662  . GERD 01/07/2008  . Essential hypertension 09/20/2007  . Multiple sclerosis ([place]) 09/18/2007    Current Outpatient Medications on File Prior to Visit  Medication Sig Dispense Refill  . ALPRAZolam (XANAX) 0.5 MG tablet Take 1 tablet (0.5 mg total) by mouth 2 (two) times daily as needed for anxiety or sleep. 90 tablet 0  . b complex vitamins tablet Take 1 tablet by mouth daily.    . Cholecalciferol (VITAMIN D) 2000 units CAPS Take 1 capsule (2,000 Units total) by mouth daily. 30 capsule   . COPAXONE 40 MG/ML SOSY 40mg /ml SQ three times weekly 36 Syringe 3  . ferrous gluconate (FERGON) 324 MG tablet Take 1 tablet (324 mg total) by mouth 2 (two) times daily with a meal. (Patient taking differently: Take 324 mg by mouth 3 (three) times a week. ) 180 tablet 3  . fluticasone (  FLONASE) 50 MCG/ACT nasal spray USE 2 SPRAYS IN EACH       NOSTRIL DAILY 16 g 6  . gabapentin (NEURONTIN) 100 MG capsule TAKE 3 CAPSULES BY MOUTH THREE TIMES DAILY 810 capsule 0  . hydrochlorothiazide (HYDRODIURIL) 25 MG tablet Take 1 tablet (25 mg total) by mouth daily. 90 tablet 3  . linaclotide (LINZESS) 145 MCG CAPS capsule Take 2 tablets daily 180 capsule 1  . loratadine-pseudoephedrine (CLARITIN-D 12 HOUR) 5-120 MG tablet Take 1 tablet by mouth  daily as needed (seasonal allergies). 30 tablet 3  . losartan (COZAAR) 50 MG tablet Take 1 tablet (50 mg total) by mouth 2 (two) times daily. 180 tablet 3  . metoprolol tartrate (LOPRESSOR) 25 MG tablet TAKE 1 TABLET BY MOUTH TWICE DAILY 180 tablet 3  . pantoprazole (PROTONIX) 40 MG tablet TAKE 1 TABLET BY MOUTH EVERY DAY 90 tablet 2  . phentermine 37.5 MG capsule Take 1 capsule (37.5 mg total) by mouth every morning. 30 capsule 0  . potassium chloride (MICRO-K) 10 MEQ CR capsule TAKE 2 CAPSULES BY MOUTH TWICE DAILY. OFFICE VISIT NEEDED FOR FURTHER REFILLS 120 capsule 0  . SYMBICORT 160-4.5 MCG/ACT inhaler INHALE 2 PUFFS INTO THE LUNGS TWICE DAILY 10.2 g 0  . Vitamin D, Ergocalciferol, (DRISDOL) 50000 units CAPS capsule TAKE 1 CAPSULE BY MOUTH EVERY 7 DAYS 4 capsule 0  . [DISCONTINUED] mometasone (NASONEX) 50 MCG/ACT nasal spray Place 2 sprays into the nose daily. 17 g 2   No current facility-administered medications on file prior to visit.     Past Medical History:  Diagnosis Date  . Allergic rhinitis due to other allergen   . Allergy   . Angioedema    ACEI  . Anxiety   . Breast cancer of upper-outer quadrant of right female breast ([place]) 10/23/2014   s/p r mastectomy, neoadj chemo completed 04/21/15; neg genetic testing  . Colon polyps 05/2014   adenomatous, colo q 5y  . CONSTIPATION, CHRONIC   . GERD   . Herpes zoster without complication 1/61/0960  . Hot flashes   . HYPERLIPIDEMIA   . HYPERTENSION   . Lactose intolerance   . Migraine headache   . Multiple sclerosis ([place])   . OBESITY, TRUNCAL     Past Surgical History:  Procedure Laterality Date  . BREAST BIOPSY Right    [place]   . COLONOSCOPY    . MASTECTOMY Right   . PORT-A-CATH REMOVAL Left 05/13/2015   Procedure: REMOVAL PORT-A-CATH;  Surgeon: [name], MD;  Location: [facility];  Service: General;  Laterality: Left;  . PORTACATH PLACEMENT Left 11/06/2014   Procedure: INSERTION PORT-A-CATH;  Surgeon: [name], MD;   Location: [facility];  Service: General;  Laterality: Left;  . SIMPLE MASTECTOMY WITH AXILLARY SENTINEL NODE BIOPSY Right 05/13/2015   Procedure: RIGHT TOTAL  MASTECTOMY WITH RIGHT  AXILLARY SENTINEL NODE BIOPSY;  Surgeon: [name], MD;  Location: [facility];  Service: General;  Laterality: Right;  . TUBAL LIGATION      Social History   Socioeconomic History  . Marital status: Single    Spouse name: Not on file  . Number of children: 2  . Years of education: 10  . Highest education level: Not on file  Occupational History  . 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 Financial resource strain: Not on file  . Food insecurity:    Worry: Not on file    Inability: Not on  file  . Transportation needs:    Medical: Not on file    Non-medical: Not on file  Tobacco Use  . Smoking status: Never Smoker  . Smokeless tobacco: Never Used  Substance and Sexual Activity  . Alcohol use: No    Alcohol/week: 0.0 standard drinks  . Drug use: No  . Sexual activity: Not on file  Lifestyle  . Physical activity:    Days per week: Not on file    Minutes per session: Not on file  . Stress: Not on file  Relationships  . Social connections:    Talks on phone: Not on file    Gets together: Not on file    Attends religious service: Not on file    Active member of club or organization: Not on file    Attends meetings of clubs or organizations: Not on file    Relationship status: Not on file  Other Topics Concern  . 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    Patient is single and lives alone.    Patient is right handed.     Family History  Problem Relation Age of Onset  . Coronary artery disease Mother   . Heart disease Mother   . Diabetes Mother   . Hypertension Mother   . Stroke Mother   . Alcohol abuse Father   . COPD Maternal  Grandmother   . Hypertension Other   . Hyperlipidemia Other   . Diabetes Other   . Colon cancer Neg Hx     Review of Systems  Constitutional: Negative for chills and fever.  Respiratory: Negative for shortness of breath.   Cardiovascular: Negative for chest pain, palpitations and leg swelling.  Gastrointestinal: Negative for abdominal pain.  Neurological: Positive for numbness and headaches (sinus).  Psychiatric/Behavioral: Positive for sleep disturbance.       Objective:   Vitals:   08/06/18 0742  BP: (!) 144/80  Pulse: 84  Resp: 16  Temp: 98.3 F (36.8 C)  SpO2: 99%   BP Readings from Last 3 Encounters:  08/06/18 (!) 144/80  06/10/18 124/82  04/02/18 115/77   Wt Readings from Last 3 Encounters:  08/06/18 219 lb (99.3 kg)  06/10/18 211 lb 12.8 oz (96.1 kg)  04/02/18 214 lb 4.8 oz (97.2 kg)   Body mass index is 36.44 kg/m.   Physical Exam    Constitutional: Appears well-developed and well-nourished. No distress.  HENT:  Head, eyes: Normocephalic and atraumatic. Conjunctivo-non-icterus, no erythema or discharge bilaterally Neck: Neck supple. No tracheal deviation present. No thyromegaly present.  No cervical lymphadenopathy Cardiovascular: Normal rate, regular rhythm and normal heart sounds.   No murmur heard. No carotid bruit .  No edema Pulmonary/Chest: Effort normal and breath sounds normal. No respiratory distress. No has no wheezes. No rales.  Skin: Skin is warm and dry. Not diaphoretic.  Psychiatric: Normal mood and affect. Behavior is normal.       Assessment & Plan:    See Problem List for Assessment and Plan of chronic medical problems.

## 2018-08-06 ENCOUNTER — Encounter: Payer: Self-pay | Admitting: Internal Medicine

## 2018-08-06 ENCOUNTER — Other Ambulatory Visit (INDEPENDENT_AMBULATORY_CARE_PROVIDER_SITE_OTHER): Payer: BC Managed Care – PPO

## 2018-08-06 ENCOUNTER — Ambulatory Visit: Payer: BC Managed Care – PPO | Admitting: Internal Medicine

## 2018-08-06 VITALS — BP 144/80 | HR 84 | Temp 98.3°F | Resp 16 | Ht 65.0 in | Wt 219.0 lb

## 2018-08-06 DIAGNOSIS — E559 Vitamin D deficiency, unspecified: Secondary | ICD-10-CM | POA: Diagnosis not present

## 2018-08-06 DIAGNOSIS — I10 Essential (primary) hypertension: Secondary | ICD-10-CM

## 2018-08-06 DIAGNOSIS — T451X5A Adverse effect of antineoplastic and immunosuppressive drugs, initial encounter: Secondary | ICD-10-CM

## 2018-08-06 DIAGNOSIS — G479 Sleep disorder, unspecified: Secondary | ICD-10-CM | POA: Diagnosis not present

## 2018-08-06 DIAGNOSIS — G620 Drug-induced polyneuropathy: Secondary | ICD-10-CM | POA: Diagnosis not present

## 2018-08-06 DIAGNOSIS — H119 Unspecified disorder of conjunctiva: Secondary | ICD-10-CM

## 2018-08-06 HISTORY — DX: Unspecified disorder of conjunctiva: H11.9

## 2018-08-06 LAB — CBC WITH DIFFERENTIAL/PLATELET
Basophils Absolute: 0 10*3/uL (ref 0.0–0.1)
Basophils Relative: 0.5 % (ref 0.0–3.0)
EOS PCT: 3.5 % (ref 0.0–5.0)
Eosinophils Absolute: 0.2 10*3/uL (ref 0.0–0.7)
HEMATOCRIT: 37.9 % (ref 36.0–46.0)
HEMOGLOBIN: 12.8 g/dL (ref 12.0–15.0)
LYMPHS PCT: 41.1 % (ref 12.0–46.0)
Lymphs Abs: 2.6 10*3/uL (ref 0.7–4.0)
MCHC: 33.6 g/dL (ref 30.0–36.0)
MCV: 88.2 fl (ref 78.0–100.0)
MONOS PCT: 8.3 % (ref 3.0–12.0)
Monocytes Absolute: 0.5 10*3/uL (ref 0.1–1.0)
Neutro Abs: 2.9 10*3/uL (ref 1.4–7.7)
Neutrophils Relative %: 46.6 % (ref 43.0–77.0)
Platelets: 264 10*3/uL (ref 150.0–400.0)
RBC: 4.3 Mil/uL (ref 3.87–5.11)
RDW: 15.3 % (ref 11.5–15.5)
WBC: 6.3 10*3/uL (ref 4.0–10.5)

## 2018-08-06 LAB — COMPREHENSIVE METABOLIC PANEL
ALT: 14 U/L (ref 0–35)
AST: 17 U/L (ref 0–37)
Albumin: 4.5 g/dL (ref 3.5–5.2)
Alkaline Phosphatase: 81 U/L (ref 39–117)
BUN: 11 mg/dL (ref 6–23)
CALCIUM: 9.8 mg/dL (ref 8.4–10.5)
CHLORIDE: 105 meq/L (ref 96–112)
CO2: 25 meq/L (ref 19–32)
Creatinine, Ser: 0.84 mg/dL (ref 0.40–1.20)
GFR: 90.13 mL/min (ref >60.00)
Glucose, Bld: 86 mg/dL (ref 70–99)
Potassium: 4.1 mEq/L (ref 3.5–5.1)
Sodium: 140 mEq/L (ref 135–145)
Total Bilirubin: 0.3 mg/dL (ref 0.2–1.2)
Total Protein: 7.3 g/dL (ref 6.0–8.3)

## 2018-08-06 LAB — VITAMIN B12: VITAMIN B 12: 524 pg/mL (ref 211–911)

## 2018-08-06 LAB — VITAMIN D 25 HYDROXY (VIT D DEFICIENCY, FRACTURES): VITD: 51.92 ng/mL (ref 30.00–100.00)

## 2018-08-06 MED ORDER — TRAZODONE HCL 50 MG PO TABS: 50.0000 mg | ORAL_TABLET | Freq: Every evening | ORAL | 3 refills | Status: DC | PRN

## 2018-08-06 NOTE — Assessment & Plan Note (Signed)
Taking 2000 units consistently Check level

## 2018-08-06 NOTE — Assessment & Plan Note (Signed)
Blood pressure elevated here today Advised her to check it on regular basis Take medication regularly CMP

## 2018-08-06 NOTE — Assessment & Plan Note (Signed)
Taking B complex daily We will check vitamin B12 level to make sure she has enough B12 and her B complex vitamin

## 2018-08-06 NOTE — Assessment & Plan Note (Signed)
Has tried over-the-counter options in the ear to make her feel drowsy in the morning or not effective Trial of trazodone 50-100 mg at bedtime

## 2018-08-06 NOTE — Patient Instructions (Signed)
  Tests ordered today. Your results will be released to [facility] (or called to you) after review, usually within 72hours after test completion. If any changes need to be made, you will be notified at that same time.   Medications reviewed and updated.  Changes include :   Trazodone 50-100 mg at night  Your prescription(s) have been submitted to your pharmacy. Please take as directed and contact our office if you believe you are having problem(s) with the medication(s).

## 2018-08-06 NOTE — Assessment & Plan Note (Signed)
No obvious icterus CMP

## 2018-08-07 ENCOUNTER — Encounter: Payer: Self-pay | Admitting: Internal Medicine

## 2018-08-13 ENCOUNTER — Other Ambulatory Visit: Payer: Self-pay | Admitting: Internal Medicine

## 2018-08-17 ENCOUNTER — Other Ambulatory Visit: Payer: Self-pay | Admitting: Internal Medicine

## 2018-09-14 ENCOUNTER — Other Ambulatory Visit: Payer: Self-pay | Admitting: Internal Medicine

## 2018-09-24 ENCOUNTER — Other Ambulatory Visit: Payer: Self-pay | Admitting: Adult Health

## 2018-09-24 DIAGNOSIS — Z1231 Encounter for screening mammogram for malignant neoplasm of breast: Secondary | ICD-10-CM

## 2018-09-25 ENCOUNTER — Encounter: Payer: Self-pay | Admitting: Internal Medicine

## 2018-09-26 MED ORDER — OSELTAMIVIR PHOSPHATE 75 MG PO CAPS: 75.0000 mg | ORAL_CAPSULE | Freq: Every day | ORAL | 0 refills | Status: DC

## 2018-10-10 ENCOUNTER — Other Ambulatory Visit: Payer: Self-pay | Admitting: Internal Medicine

## 2018-10-15 ENCOUNTER — Other Ambulatory Visit: Payer: Self-pay | Admitting: Internal Medicine

## 2018-10-15 DIAGNOSIS — G5711 Meralgia paresthetica, right lower limb: Secondary | ICD-10-CM

## 2018-10-15 NOTE — Progress Notes (Signed)
Subjective:    Patient ID: [name] [name], female    DOB: 1961-03-13, 58 y.o.   MRN: [id number]  HPI She is here for a physical exam.   Overall, she feels good.   Her sleep is not good.  She takes trazodone 100 mg and it gets her to sleep.  She will wake up after an hour and then can't go back to sleep.  She is tired all day, but still has difficulty sleeping.  She has tried some natural things may have worked well.  She is currently not exercising regularly.  She is experiencing a lot of stress and anxiety.  Her job is extremely stressful.  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  She currently has FMLA for intermittent days off.   She uses FMLA about a couple of days a month - usually calls out for fatigue, which is likely related to significant stress.  She feels like she needs more time off now.  She feels very stressed - difficulty sleeping is causing significant fatigue and she knows most likely this is all related to the stress.  She is debating if she needs to revamp her FMLA day and additional days during the week or have one continuous period of time off soon.  Medications and allergies reviewed with patient and updated if appropriate.  Patient Active Problem List   Diagnosis Date Noted  . Sleep difficulties 08/06/2018  . Conjunctiva disorder 08/06/2018  . Ear canal dryness 01/23/2018  . Trigger finger 01/23/2018  . Meralgia paraesthetica, right 11/05/2017  . Iron deficiency 10/09/2017  . Vitamin D deficiency 06/15/2017  . LUQ cramping 04/21/2017  . Lumbar radiculopathy 04/21/2017  . Trigger index finger of left hand 04/21/2017  . Obesity (BMI 35.0-39.9 without comorbidity) 04/21/2017  . Left hip pain 10/31/2016  . Pes planus 10/31/2016  . Posterior tibialis muscle dysfunction 10/31/2016  . Depression 09/03/2016  . Memory difficulties 09/03/2016  . Breast pain, right 08/24/2016  . Palpitations 04/04/2016  .  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 Malignant neoplasm of upper-outer quadrant of right breast in female, 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[place]) 11/03/2015  . S/P mastectomy 05/13/2015  . Chemotherapy-induced neuropathy ([place]) 04/21/2015  . Genetic testing 12/11/2014  . Breast cancer of upper-outer quadrant of right female breast ([place]) 10/23/2014  . Anxiety 04/29/2012  . Allergic rhinitis 11/29/2009  . 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 CONSTIPATION, CHRONIC 08/17/2008  . Dyslipidemia 04/24/2008  . METABOLIC SYNDROME X 99/77/4142  . GERD 01/07/2008  . Essential hypertension 09/20/2007  . Multiple sclerosis ([place]) 09/18/2007    Current Outpatient Medications on File Prior to Visit  Medication Sig Dispense Refill  . ALPRAZolam (XANAX) 0.5 MG tablet Take 1 tablet (0.5 mg total) by mouth 2 (two) times daily as needed for anxiety or sleep. 90 tablet 0  . b complex vitamins tablet Take 1 tablet by mouth daily.    [name] COPAXONE 40 MG/ML SOSY 40mg /ml SQ three times weekly 36 Syringe 3  . ferrous gluconate (FERGON) 324 MG tablet Take 1 tablet (324 mg total) by mouth 2 (two) times daily with a meal. (Patient taking differently: Take 324 mg by mouth 3 (three) times a week. ) 180 tablet 3  . fluticasone (FLONASE) 50 MCG/ACT nasal spray USE 2 SPRAYS IN EACH       NOSTRIL DAILY 16 g 6  . gabapentin (NEURONTIN) 100 MG capsule TAKE 3 CAPSULES BY MOUTH THREE TIMES DAILY 810  capsule 0  . hydrochlorothiazide (HYDRODIURIL) 25 MG tablet Take 1 tablet (25 mg total) by mouth daily. 90 tablet 3  . linaclotide (LINZESS) 145 MCG CAPS capsule Take 2 tablets daily 180 capsule 1  . losartan (COZAAR) 50 MG tablet Take 1 tablet (50 mg total) by mouth 2 (two) times daily. 180 tablet 3  . metoprolol tartrate (LOPRESSOR) 25 MG tablet TAKE 1 TABLET BY MOUTH TWICE DAILY 180 tablet 3  . pantoprazole (PROTONIX) 40 MG tablet TAKE 1 TABLET BY MOUTH EVERY DAY 90 tablet 2  . phentermine 37.5 MG capsule Take 1 capsule (37.5 mg total) by mouth every morning. 30  capsule 0  . potassium chloride (MICRO-K) 10 MEQ CR capsule TAKE 2 CAPSULES BY MOUTH TWICE DAILY 120 capsule 0  . SYMBICORT 160-4.5 MCG/ACT inhaler INHALE 2 PUFFS INTO THE LUNGS TWICE DAILY 10.2 g 0  . traZODone (DESYREL) 50 MG tablet Take 1-2 tablets (50-100 mg total) by mouth at bedtime as needed for sleep. 60 tablet 3  . Vitamin D, Ergocalciferol, (DRISDOL) 1.25 MG (50000 UT) CAPS capsule TAKE 1 CAPSULE BY MOUTH EVERY 7 DAYS 4 capsule 0   No current facility-administered medications on file prior to visit.     Past Medical History:  Diagnosis Date  . Allergic rhinitis due to other allergen   . Allergy   . Angioedema    ACEI  . Anxiety   . Breast cancer of upper-outer quadrant of right female breast ([place]) 10/23/2014   s/p r mastectomy, neoadj chemo completed 04/21/15; neg genetic testing  . Colon polyps 05/2014   adenomatous, colo q 5y  . CONSTIPATION, CHRONIC   . GERD   . Herpes zoster without complication 7/82/9562  . Hot flashes   . HYPERLIPIDEMIA   . HYPERTENSION   . Lactose intolerance   . Migraine headache   . Multiple sclerosis ([place])   . OBESITY, TRUNCAL     Past Surgical History:  Procedure Laterality Date  . BREAST BIOPSY Right    [place]   . COLONOSCOPY    . MASTECTOMY Right   . PORT-A-CATH REMOVAL Left 05/13/2015   Procedure: REMOVAL PORT-A-CATH;  Surgeon: [name], MD;  Location: [facility];  Service: General;  Laterality: Left;  . PORTACATH PLACEMENT Left 11/06/2014   Procedure: INSERTION PORT-A-CATH;  Surgeon: [name], MD;  Location: [facility];  Service: General;  Laterality: Left;  . SIMPLE MASTECTOMY WITH AXILLARY SENTINEL NODE BIOPSY Right 05/13/2015   Procedure: RIGHT TOTAL  MASTECTOMY WITH RIGHT  AXILLARY SENTINEL NODE BIOPSY;  Surgeon: [name], MD;  Location: [facility];  Service: General;  Laterality: Right;  . TUBAL LIGATION      Social History   Socioeconomic History  . Marital status: Single    Spouse name: Not on file   . Number of children: 2  . Years of education: 5  . Highest education level: Not on file  Occupational History  . 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 Financial resource strain: Not on file  . Food insecurity:    Worry: Not on file    Inability: Not on file  . Transportation needs:    Medical: Not on file    Non-medical: Not on file  Tobacco Use  . Smoking status: Never Smoker  . Smokeless tobacco: Never Used  Substance and Sexual Activity  . Alcohol use: No    Alcohol/week: 0.0 standard drinks  . Drug use: No  .  Sexual activity: Not on file  Lifestyle  . Physical activity:    Days per week: Not on file    Minutes per session: Not on file  . Stress: Not on file  Relationships  . Social connections:    Talks on phone: Not on file    Gets together: Not on file    Attends religious service: Not on file    Active member of club or organization: Not on file    Attends meetings of clubs or organizations: Not on file    Relationship status: Not on file  Other Topics Concern  . 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    Patient is single and lives alone.    Patient is right handed.     Family History  Problem Relation Age of Onset  . Coronary artery disease Mother   . Heart disease Mother   . Diabetes Mother   . Hypertension Mother   . Stroke Mother   . Alcohol abuse Father   . COPD Maternal Grandmother   . Hypertension Other   . Hyperlipidemia Other   . Diabetes Other   . Colon cancer Neg Hx     Review of Systems  Constitutional: Negative for chills and fever.  Eyes: Negative for visual disturbance.  Respiratory: Negative for cough, shortness of breath and wheezing.   Cardiovascular: Positive for palpitations (anxiety related, occasional). Negative for chest pain and leg swelling.  Gastrointestinal: Negative for  abdominal pain, blood in stool, constipation, diarrhea and nausea.       No gerd  Genitourinary: Negative for dysuria and hematuria.  Musculoskeletal: Positive for arthralgias (all joints). Negative for back pain.  Skin: Negative for color change and rash.  Neurological: Negative for dizziness, light-headedness and headaches.  Psychiatric/Behavioral: Positive for decreased concentration (sometimes) and sleep disturbance. Negative for dysphoric mood. The patient is nervous/anxious.        Objective:   Vitals:   10/16/18 0856  BP: 134/72  Pulse: 79  Resp: 16  Temp: 98.8 F (37.1 C)  SpO2: 98%   Filed Weights   10/16/18 0856  Weight: 218 lb 12.8 oz (99.2 kg)   Body mass index is 36.41 kg/m.  BP Readings from Last 3 Encounters:  10/16/18 134/72  08/06/18 (!) 144/80  06/10/18 124/82    Wt Readings from Last 3 Encounters:  10/16/18 218 lb 12.8 oz (99.2 kg)  08/06/18 219 lb (99.3 kg)  06/10/18 211 lb 12.8 oz (96.1 kg)     Physical Exam Constitutional: She appears well-developed and well-nourished. No distress.  HENT:  Head: Normocephalic and atraumatic.  Right Ear: External ear normal. Normal ear canal and TM Left Ear: External ear normal.  Normal ear canal and TM Mouth/Throat: Oropharynx is clear and moist.  Eyes: Conjunctivae and EOM are normal.  Neck: Neck supple. No tracheal deviation present. No thyromegaly present.  No carotid bruit  Cardiovascular: Normal rate, regular rhythm and normal heart sounds.   No murmur heard.  No edema. Pulmonary/Chest: Effort normal and breath sounds normal. No respiratory distress. She has no wheezes. She has no rales.  Breast: deferred to Gyn Abdominal: Soft. She exhibits no distension. There is no tenderness.  Lymphadenopathy: She has no cervical adenopathy.  Skin: Skin is warm and dry. She is not diaphoretic.  Psychiatric: She has a normal mood and affect here today. Her behavior is normal.  Assessment & Plan:    Physical exam: Screening blood work ordered Immunizations    Up to date  Colonoscopy  Up to date -  Due later this year Mammogram    Up to date  - scheduled for next month Gyn     Up to date  Eye exams  Up to date  EKG  02/2017 Exercise   None  -- stressed the importance of regular-pla Weight   Encouraged weight loss Skin  No concerns Substance abuse   none  See Problem List for Assessment and Plan of chronic medical problems.    Follow-up in 6 weeks

## 2018-10-15 NOTE — Patient Instructions (Addendum)
Tests ordered today. Your results will be released to [place] (or called to you) after review, usually within 72hours after test completion. If any changes need to be made, you will be notified at that same time.  All other Health Maintenance issues reviewed.   All recommended immunizations and age-appropriate screenings are up-to-date or discussed.  No immunizations administered today.   Medications reviewed and updated.  Changes include :   Start phentermine  Your prescription(s) have been submitted to your pharmacy. Please take as directed and contact our office if you believe you are having problem(s) with the medication(s).   Please followup in 6 weeks   Health Maintenance, Female Adopting a healthy lifestyle and getting preventive care can go a long way to promote health and wellness. Talk with your health care provider about what schedule of regular examinations is right for you. This is a good chance for you to check in with your provider about disease prevention and staying healthy. In between checkups, there are plenty of things you can do on your own. Experts have done a lot of research about which lifestyle changes and preventive measures are most likely to keep you healthy. Ask your health care provider for more information. Weight and diet Eat a healthy diet  Be sure to include plenty of vegetables, fruits, low-fat dairy products, and lean protein.  Do not eat a lot of foods high in solid fats, added sugars, or salt.  Get regular exercise. This is one of the most important things you can do for your health. ? Most adults should exercise for at least 150 minutes each week. The exercise should increase your heart rate and make you sweat (moderate-intensity exercise). ? Most adults should also do strengthening exercises at least twice a week. This is in addition to the moderate-intensity exercise. Maintain a healthy weight  Body mass index (BMI) is a measurement that can be  used to identify possible weight problems. It estimates body fat based on height and weight. Your health care provider can help determine your BMI and help you achieve or maintain a healthy weight.  For females 40 years of age and older: ? A BMI below 18.5 is considered underweight. ? A BMI of 18.5 to 24.9 is normal. ? A BMI of 25 to 29.9 is considered overweight. ? A BMI of 30 and above is considered obese. Watch levels of cholesterol and blood lipids  You should start having your blood tested for lipids and cholesterol at 57 years of age, then have this test every 5 years.  You may need to have your cholesterol levels checked more often if: ? Your lipid or cholesterol levels are high. ? You are older than 57 years of age. ? You are at high risk for heart disease. Cancer screening Lung Cancer  Lung cancer screening is recommended for adults 74-22 years old who are at high risk for lung cancer because of a history of smoking.  A yearly low-dose CT scan of the lungs is recommended for people who: ? Currently smoke. ? Have quit within the past 15 years. ? Have at least a 30-pack-year history of smoking. A pack year is smoking an average of one pack of cigarettes a day for 1 year.  Yearly screening should continue until it has been 15 years since you quit.  Yearly screening should stop if you develop a health problem that would prevent you from having lung cancer treatment. Breast Cancer  Practice breast self-awareness. This means understanding  how your breasts normally appear and feel.  It also means doing regular breast self-exams. Let your health care provider know about any changes, no matter how small.  If you are in your 20s or 30s, you should have a clinical breast exam (CBE) by a health care provider every 1-3 years as part of a regular health exam.  If you are 34 or older, have a CBE every year. Also consider having a breast X-ray (mammogram) every year.  If you have a  family history of breast cancer, talk to your health care provider about genetic screening.  If you are at high risk for breast cancer, talk to your health care provider about having an MRI and a mammogram every year.  Breast cancer gene (BRCA) assessment is recommended for women who have family members with BRCA-related cancers. BRCA-related cancers include: ? Breast. ? Ovarian. ? Tubal. ? Peritoneal cancers.  Results of the assessment will determine the need for genetic counseling and BRCA1 and BRCA2 testing. Cervical Cancer Your health care provider may recommend that you be screened regularly for cancer of the pelvic organs (ovaries, uterus, and vagina). This screening involves a pelvic examination, including checking for microscopic changes to the surface of your cervix (Pap test). You may be encouraged to have this screening done every 3 years, beginning at age 76.  For women ages 63-65, health care providers may recommend pelvic exams and Pap testing every 3 years, or they may recommend the Pap and pelvic exam, combined with testing for human papilloma virus (HPV), every 5 years. Some types of HPV increase your risk of cervical cancer. Testing for HPV may also be done on women of any age with unclear Pap test results.  Other health care providers may not recommend any screening for nonpregnant women who are considered low risk for pelvic cancer and who do not have symptoms. Ask your health care provider if a screening pelvic exam is right for you.  If you have had past treatment for cervical cancer or a condition that could lead to cancer, you need Pap tests and screening for cancer for at least 20 years after your treatment. If Pap tests have been discontinued, your risk factors (such as having a new sexual partner) need to be reassessed to determine if screening should resume. Some women have medical problems that increase the chance of getting cervical cancer. In these cases, your health  care provider may recommend more frequent screening and Pap tests. Colorectal Cancer  This type of cancer can be detected and often prevented.  Routine colorectal cancer screening usually begins at 57 years of age and continues through 57 years of age.  Your health care provider may recommend screening at an earlier age if you have risk factors for colon cancer.  Your health care provider may also recommend using home test kits to check for hidden blood in the stool.  A small camera at the end of a tube can be used to examine your colon directly (sigmoidoscopy or colonoscopy). This is done to check for the earliest forms of colorectal cancer.  Routine screening usually begins at age 40.  Direct examination of the colon should be repeated every 5-10 years through 57 years of age. However, you may need to be screened more often if early forms of precancerous polyps or small growths are found. Skin Cancer  Check your skin from head to toe regularly.  Tell your health care provider about any new moles or changes in moles,  especially if there is a change in a mole's shape or color.  Also tell your health care provider if you have a mole that is larger than the size of a pencil eraser.  Always use sunscreen. Apply sunscreen liberally and repeatedly throughout the day.  Protect yourself by wearing long sleeves, pants, a wide-brimmed hat, and sunglasses whenever you are outside. Heart disease, diabetes, and high blood pressure  High blood pressure causes heart disease and increases the risk of stroke. High blood pressure is more likely to develop in: ? People who have blood pressure in the high end of the normal range (130-139/85-89 mm Hg). ? People who are overweight or obese. ? People who are African American.  If you are 82-102 years of age, have your blood pressure checked every 3-5 years. If you are 69 years of age or older, have your blood pressure checked every year. You should have  your blood pressure measured twice-once when you are at a hospital or clinic, and once when you are not at a hospital or clinic. Record the average of the two measurements. To check your blood pressure when you are not at a hospital or clinic, you can use: ? An automated blood pressure machine at a pharmacy. ? A home blood pressure monitor.  If you are between 44 years and 73 years old, ask your health care provider if you should take aspirin to prevent strokes.  Have regular diabetes screenings. This involves taking a blood sample to check your fasting blood sugar level. ? If you are at a normal weight and have a low risk for diabetes, have this test once every three years after 57 years of age. ? If you are overweight and have a high risk for diabetes, consider being tested at a younger age or more often. Preventing infection Hepatitis B  If you have a higher risk for hepatitis B, you should be screened for this virus. You are considered at high risk for hepatitis B if: ? You were born in a country where hepatitis B is common. Ask your health care provider which countries are considered high risk. ? Your parents were born in a high-risk country, and you have not been immunized against hepatitis B (hepatitis B vaccine). ? You have HIV or AIDS. ? You use needles to inject street drugs. ? You live with someone who has hepatitis B. ? You have had sex with someone who has hepatitis B. ? You get hemodialysis treatment. ? You take certain medicines for conditions, including cancer, organ transplantation, and autoimmune conditions. Hepatitis C  Blood testing is recommended for: ? Everyone born from [birth year] through 1964. ? Anyone with known risk factors for hepatitis C. Sexually transmitted infections (STIs)  You should be screened for sexually transmitted infections (STIs) including gonorrhea and chlamydia if: ? You are sexually active and are younger than 57 years of age. ? You are older than  57 years of age and your health care provider tells you that you are at risk for this type of infection. ? Your sexual activity has changed since you were last screened and you are at an increased risk for chlamydia or gonorrhea. Ask your health care provider if you are at risk.  If you do not have HIV, but are at risk, it may be recommended that you take a prescription medicine daily to prevent HIV infection. This is called pre-exposure prophylaxis (PrEP). You are considered at risk if: ? You are sexually active and do  not regularly use condoms or know the HIV status of your partner(s). ? You take drugs by injection. ? You are sexually active with a partner who has HIV. Talk with your health care provider about whether you are at high risk of being infected with HIV. If you choose to begin PrEP, you should first be tested for HIV. You should then be tested every 3 months for as long as you are taking PrEP. Pregnancy  If you are premenopausal and you may become pregnant, ask your health care provider about preconception counseling.  If you may become pregnant, take 400 to 800 micrograms (mcg) of folic acid every day.  If you want to prevent pregnancy, talk to your health care provider about birth control (contraception). Osteoporosis and menopause  Osteoporosis is a disease in which the bones lose minerals and strength with aging. This can result in serious bone fractures. Your risk for osteoporosis can be identified using a bone density scan.  If you are 55 years of age or older, or if you are at risk for osteoporosis and fractures, ask your health care provider if you should be screened.  Ask your health care provider whether you should take a calcium or vitamin D supplement to lower your risk for osteoporosis.  Menopause may have certain physical symptoms and risks.  Hormone replacement therapy may reduce some of these symptoms and risks. Talk to your health care provider about whether  hormone replacement therapy is right for you. Follow these instructions at home:  Schedule regular health, dental, and eye exams.  Stay current with your immunizations.  Do not use any tobacco products including cigarettes, chewing tobacco, or electronic cigarettes.  If you are pregnant, do not drink alcohol.  If you are breastfeeding, limit how much and how often you drink alcohol.  Limit alcohol intake to no more than 1 drink per day for nonpregnant women. One drink equals 12 ounces of beer, 5 ounces of wine, or 1 ounces of hard liquor.  Do not use street drugs.  Do not share needles.  Ask your health care provider for help if you need support or information about quitting drugs.  Tell your health care provider if you often feel depressed.  Tell your health care provider if you have ever been abused or do not feel safe at home. This information is not intended to replace advice given to you by your health care provider. Make sure you discuss any questions you have with your health care provider. Document Released: 03/20/2011 Document Revised: 02/10/2016 Document Reviewed: 06/08/2015 Elsevier Interactive Patient Education  2019 [employer].

## 2018-10-16 ENCOUNTER — Other Ambulatory Visit (INDEPENDENT_AMBULATORY_CARE_PROVIDER_SITE_OTHER): Payer: BC Managed Care – PPO

## 2018-10-16 ENCOUNTER — Encounter: Payer: Self-pay | Admitting: Internal Medicine

## 2018-10-16 ENCOUNTER — Ambulatory Visit (INDEPENDENT_AMBULATORY_CARE_PROVIDER_SITE_OTHER): Payer: BC Managed Care – PPO | Admitting: Internal Medicine

## 2018-10-16 VITALS — BP 134/72 | HR 79 | Temp 98.8°F | Resp 16 | Ht 65.0 in | Wt 218.8 lb

## 2018-10-16 DIAGNOSIS — Z0000 Encounter for general adult medical examination without abnormal findings: Secondary | ICD-10-CM

## 2018-10-16 DIAGNOSIS — F419 Anxiety disorder, unspecified: Secondary | ICD-10-CM

## 2018-10-16 DIAGNOSIS — I10 Essential (primary) hypertension: Secondary | ICD-10-CM | POA: Diagnosis not present

## 2018-10-16 DIAGNOSIS — G35 Multiple sclerosis: Secondary | ICD-10-CM

## 2018-10-16 DIAGNOSIS — K219 Gastro-esophageal reflux disease without esophagitis: Secondary | ICD-10-CM

## 2018-10-16 DIAGNOSIS — N644 Mastodynia: Secondary | ICD-10-CM

## 2018-10-16 DIAGNOSIS — G479 Sleep disorder, unspecified: Secondary | ICD-10-CM

## 2018-10-16 DIAGNOSIS — E669 Obesity, unspecified: Secondary | ICD-10-CM

## 2018-10-16 LAB — CBC WITH DIFFERENTIAL/PLATELET
Basophils Absolute: 0 10*3/uL (ref 0.0–0.1)
Basophils Relative: 0.4 % (ref 0.0–3.0)
EOS ABS: 0.2 10*3/uL (ref 0.0–0.7)
Eosinophils Relative: 3 % (ref 0.0–5.0)
HCT: 38.3 % (ref 36.0–46.0)
HEMOGLOBIN: 13.2 g/dL (ref 12.0–15.0)
Lymphocytes Relative: 39.7 % (ref 12.0–46.0)
Lymphs Abs: 2.6 10*3/uL (ref 0.7–4.0)
MCHC: 34.5 g/dL (ref 30.0–36.0)
MCV: 87.7 fl (ref 78.0–100.0)
Monocytes Absolute: 0.4 10*3/uL (ref 0.1–1.0)
Monocytes Relative: 5.7 % (ref 3.0–12.0)
Neutro Abs: 3.3 10*3/uL (ref 1.4–7.7)
Neutrophils Relative %: 51.2 % (ref 43.0–77.0)
Platelets: 254 10*3/uL (ref 150.0–400.0)
RBC: 4.37 Mil/uL (ref 3.87–5.11)
RDW: 15.1 % (ref 11.5–15.5)
WBC: 6.5 10*3/uL (ref 4.0–10.5)

## 2018-10-16 LAB — COMPREHENSIVE METABOLIC PANEL
ALT: 18 U/L (ref 0–35)
AST: 18 U/L (ref 0–37)
Albumin: 4.5 g/dL (ref 3.5–5.2)
Alkaline Phosphatase: 87 U/L (ref 39–117)
BUN: 14 mg/dL (ref 6–23)
CO2: 29 mEq/L (ref 19–32)
Calcium: 10.2 mg/dL (ref 8.4–10.5)
Chloride: 102 mEq/L (ref 96–112)
Creatinine, Ser: 0.85 mg/dL (ref 0.40–1.20)
GFR: 83.59 mL/min (ref >60.00)
Glucose, Bld: 88 mg/dL (ref 70–99)
Potassium: 3.9 mEq/L (ref 3.5–5.1)
Sodium: 140 mEq/L (ref 135–145)
Total Bilirubin: 0.5 mg/dL (ref 0.2–1.2)
Total Protein: 7.5 g/dL (ref 6.0–8.3)

## 2018-10-16 LAB — LIPID PANEL
CHOL/HDL RATIO: 4
CHOLESTEROL: 202 mg/dL — AB (ref 0–200)
HDL: 48 mg/dL (ref >39.00)
LDL Cholesterol: 121 mg/dL — ABNORMAL HIGH (ref 0–99)
NonHDL: 153.85
Triglycerides: 166 mg/dL — ABNORMAL HIGH (ref 0.0–149.0)
VLDL: 33.2 mg/dL (ref 0.0–40.0)

## 2018-10-16 LAB — TSH: TSH: 2.26 u[IU]/mL (ref 0.35–4.50)

## 2018-10-16 MED ORDER — MOMETASONE FUROATE 50 MCG/ACT NA SUSP: 2.0000 | Freq: Every day | NASAL | 2 refills | Status: DC

## 2018-10-16 MED ORDER — LORATADINE-PSEUDOEPHEDRINE ER 5-120 MG PO TB12: 1.0000 | ORAL_TABLET | Freq: Every day | ORAL | 1 refills | Status: DC | PRN

## 2018-10-16 MED ORDER — ALPRAZOLAM 0.5 MG PO TABS: 0.5000 mg | ORAL_TABLET | Freq: Two times a day (BID) | ORAL | 0 refills | Status: DC | PRN

## 2018-10-16 MED ORDER — PHENTERMINE HCL 37.5 MG PO CAPS: 37.5000 mg | ORAL_CAPSULE | ORAL | 0 refills | Status: DC

## 2018-10-16 NOTE — Assessment & Plan Note (Signed)
She is not currently exercising and stressed that she needs to be exercising regularly to help her lose weight and for multiple other reasons Stressed healthy diet and decreased portions Reviewed that lifestyle is the only thing that will help her lose weight and keep the weight off We did try to start phentermine at her last visit, but needed prior authorization and after that she did not need any new prescription She would like to try it-advised this will be short-term only Sent to pharmacy

## 2018-10-16 NOTE — Assessment & Plan Note (Signed)
Uses Xanax as needed when she feels that this works well and she likes not taking folate on a regular basis.  She does not take it often and she denies any side effects. Will renew She is having significant stress and anxiety and we discussed starting a daily medication-SSRI.  She really wants to work on lifestyle and avoid this medication Stressed the importance of regular exercise Discussed other natural ways of destressing Discussed herbal supplements May need to adjust FMLA-we will follow-up in 6 weeks to reevaluate

## 2018-10-16 NOTE — Assessment & Plan Note (Signed)
Neuropathy-controlled with gabapentin

## 2018-10-16 NOTE — Assessment & Plan Note (Signed)
GERD controlled Continue daily medication  

## 2018-10-16 NOTE — Assessment & Plan Note (Signed)
Has extreme sleep difficulties, causing chronic fatigue Has been taking trazodone 100 mg nightly and this helps minimally-helps her get to sleep, but does not keep her sleep Discussed that unless her stress gets better controlled she may have difficulty sleeping Declined a daily SSRI Discussed regular exercise Discussed her anxiety and stress at length

## 2018-10-16 NOTE — Assessment & Plan Note (Signed)
BP well controlled Current regimen effective and well tolerated Continue current medications at current doses cmp  

## 2018-10-16 NOTE — Assessment & Plan Note (Signed)
Following with neurology Encouraged regular exercise

## 2018-10-21 ENCOUNTER — Telehealth: Payer: Self-pay | Admitting: Internal Medicine

## 2018-10-21 NOTE — Telephone Encounter (Signed)
Pt aware of results 

## 2018-10-21 NOTE — Telephone Encounter (Signed)
Copied from [place] [phone]. Topic: General - Inquiry >> Oct 21, 2018  9:03 AM Margot Ables wrote: Reason for CRM: pt saw results on mychart but no notes from Dr. Quay Burow. Please advise on how to proceed.

## 2018-10-29 ENCOUNTER — Telehealth: Payer: Self-pay | Admitting: *Deleted

## 2018-10-29 NOTE — Telephone Encounter (Signed)
Notified pt w/MD response.../lmb 

## 2018-10-29 NOTE — Telephone Encounter (Signed)
Rec'd call pt is wanting MD advice about taking Mag 07 & Liver Focus. Is this something that is ok w/her other medications,../LMB

## 2018-10-29 NOTE — Telephone Encounter (Signed)
It is safe to try the supplements with her other medications.

## 2018-11-01 ENCOUNTER — Ambulatory Visit
Admission: RE | Admit: 2018-11-01 | Discharge: 2018-11-01 | Disposition: A | Discharge status: Home / Self Care | Payer: BC Managed Care – PPO | Source: Ambulatory Visit | Attending: Adult Health | Admitting: Adult Health

## 2018-11-01 DIAGNOSIS — Z1231 Encounter for screening mammogram for malignant neoplasm of breast: Secondary | ICD-10-CM

## 2018-11-11 ENCOUNTER — Encounter: Payer: Self-pay | Admitting: Neurology

## 2018-11-13 ENCOUNTER — Telehealth: Payer: Self-pay | Admitting: Neurology

## 2018-11-13 NOTE — Telephone Encounter (Signed)
Called the patient back. The patient states that she has been struggled with this for a while. She states she has tried trazodone, benadryl, zzquil, calming tea's, xanax and lavender essential oil and nothing has helped her sleep and she is really just wanting sleep. This is something she has only dealt with since her cancer diagnosis. I advised the patient that since we have not seen her for sleep before and the sleep clinic is a separate side of our clinic she would need her PCP to send a sleep referral in and we will get her in as soon as possibly can. Patient states she did have a sleep study completed through [place] [facility] but it was over 5 yrs ago. Doesn't recall ever being treated for anything based off that study. I advised that im not sure if Dr Brett Fairy will necessarily recommend sleep study but at least would give her the opportunity to work her up for the sleep difficulties and see what would need to be completed for her. Patient states she will contact her PCP to get the referral placed and I have blocked march 30th at 3:30 for her at the time and told her I would place her on wait list if something opens up sooner. Pt verbalized understanding.

## 2018-11-13 NOTE — Telephone Encounter (Signed)
Pt is having trouble sleeping worsening over the past few years (since cancer dx in 2016). PCP has taken care of this in the past, nothing is helping at this time. She said PCP is wanting to know if there is something Dr D could recommend. Please call to advise

## 2018-11-15 ENCOUNTER — Telehealth: Payer: Self-pay

## 2018-11-15 DIAGNOSIS — G479 Sleep disorder, unspecified: Secondary | ICD-10-CM

## 2018-11-15 NOTE — Telephone Encounter (Signed)
ordered

## 2018-11-15 NOTE — Telephone Encounter (Signed)
Copied from [place] #[id number]. Topic: Referral - Request for Referral >> Nov 14, 2018  2:18 PM Lennox Solders wrote:  patient seen PCP for this complaint? Yes. Pt would like a referral to dr carmen dohmier at [facility] neurologist for sleeping issues. Phone number [phone]

## 2018-11-19 ENCOUNTER — Other Ambulatory Visit: Payer: Self-pay | Admitting: Internal Medicine

## 2018-11-21 ENCOUNTER — Other Ambulatory Visit: Payer: Self-pay | Admitting: Internal Medicine

## 2018-11-21 ENCOUNTER — Encounter: Payer: Self-pay | Admitting: Neurology

## 2018-11-21 ENCOUNTER — Ambulatory Visit (INDEPENDENT_AMBULATORY_CARE_PROVIDER_SITE_OTHER): Payer: BC Managed Care – PPO | Admitting: Neurology

## 2018-11-21 VITALS — BP 128/86 | HR 104 | Ht 65.0 in | Wt 220.0 lb

## 2018-11-21 DIAGNOSIS — F5104 Psychophysiologic insomnia: Secondary | ICD-10-CM | POA: Insufficient documentation

## 2018-11-21 DIAGNOSIS — G35 Multiple sclerosis: Secondary | ICD-10-CM

## 2018-11-21 DIAGNOSIS — Z171 Estrogen receptor negative status [ER-]: Secondary | ICD-10-CM

## 2018-11-21 DIAGNOSIS — C50411 Malignant neoplasm of upper-outer quadrant of right female breast: Secondary | ICD-10-CM | POA: Diagnosis not present

## 2018-11-21 DIAGNOSIS — G2581 Restless legs syndrome: Secondary | ICD-10-CM | POA: Insufficient documentation

## 2018-11-21 DIAGNOSIS — G5711 Meralgia paresthetica, right lower limb: Secondary | ICD-10-CM

## 2018-11-21 DIAGNOSIS — E8881 Metabolic syndrome: Secondary | ICD-10-CM

## 2018-11-21 DIAGNOSIS — T451X5A Adverse effect of antineoplastic and immunosuppressive drugs, initial encounter: Secondary | ICD-10-CM

## 2018-11-21 DIAGNOSIS — F419 Anxiety disorder, unspecified: Secondary | ICD-10-CM

## 2018-11-21 DIAGNOSIS — G620 Drug-induced polyneuropathy: Secondary | ICD-10-CM | POA: Insufficient documentation

## 2018-11-21 MED ORDER — PREDNISONE 10 MG (21) PO TBPK: ORAL_TABLET | ORAL | 0 refills | Status: DC

## 2018-11-21 MED ORDER — COPAXONE 40 MG/ML ~~LOC~~ SOSY: PREFILLED_SYRINGE | SUBCUTANEOUS | 3 refills | Status: DC

## 2018-11-21 NOTE — Patient Instructions (Signed)

## 2018-11-21 NOTE — Progress Notes (Signed)
SLEEP MEDICINE CLINIC   Provider:  Larey Seat, [place] D  Primary Care Physician:  [name] Rail, MD   Referring Provider: Binnie Rail, MD   Chief Complaint  Patient presents with  . New Patient (Initial Visit)    pt alone, rm 10. pt states that since having chemo and developed neuropathy she has had difficulty with her sleep. pt states that she has hard time staying asleep. pt tosses and turns and unsure if she has apenic episodes  she states that she had a sleep study completed over 5 yrs ago and she was told she didn't have apnea. she states that she has daytime sleepiness and she can be yawning while talk and feel so tired but struggle to fall asleep.     HPI:  [name] [name] is a 58 y.o. female , seen here as in a referral  from [name] [name] for insomnia;  Provider:  Larey Seat, MD   Referring Provider: Binnie Rail, MD Primary Care Physician:  [name] Rail, MD  Chief Complaint  Patient presents with  . New Patient (Initial Visit)    pt alone, rm 10. pt states that since having chemo and developed neuropathy she has had difficulty with her sleep. pt states that she has hard time staying asleep. pt tosses and turns and unsure if she has apenic episodes  she states that she had a sleep study completed over 5 yrs ago and she was told she didn't have apnea. she states that she has daytime sleepiness and she can be yawning while talk and feel so tired but struggle to fall asleep.     11-21-2018  [name] [name] is a 58 y.o. female is seen after one year- last seen by [name] [name] in  March 2019, and was originally here as a referral from [name] [name] for MS care, headaches and re-establish care at [facility]. I have the pleasure of meeting Ms. [name] [name] today whom I personally last saw in 2017 who has seen the last 2 visits in our office [name] Rubin, NP.  The original referral or re-referral was for neuropathy but today we are meeting to discuss her sleeping  difficulties.  Over 5 years ago the patient had undergone a sleep study at the [facility] long sleep lab.  She was never treated for anything based off that study and assume that there was nothing found.  There have been a lot of medical changes however, she is a cancer survivor and has been declared breast cancer free, 5 years status post chemotherapy ( no radiation but surgery, a mastectomy of the right breast). She feels that her cancer diagnosis and treatment coincide with her insomnia problems.    Sleep habits: The patient lives alone and has an early dinner usually around 5 PM, she may spend the evening watching TV or reading and she goes to bed by 8 or 9 PM, but she also watches TV in bed.  She waits until she feels sleepy enough, switches the TV off and then still struggles often to fall asleep. Right side sleeper, using one pillow. The bedroom is cool, quiet and dark after the TV is off.   Once asleep, she may stay asleep for 2 to 3 hours, sometimes woken by hot flashes, other times by hand and arm pain ( likely related to her neuropathy), many times she is not sure what wakes her. Sometimes she will go to the bathroom but it was not the urge to  urinate that woke her up. If she wakes around midnight it will take her an hour before she can go back to sleep.  Once asleep she may sleep again for 2 to 3 hours. After she wakes up again, she can't sleep any longer. She rises at 4.30AM for work. She feels fatigues, sleepiness and lethargy.     "HX 2017- new visit . Over the last 3 years , the patient had been followed by Dr. Doyle [name], neurologist at [facility].  She has MRIs at [facility], and was in the meantime diagnosed with breast cancer.  She received the diagnosed on February 9th 2016 .The patient had not been seen in our practice since Epic has been implemented. Her last visit was with [name] [name] on 10/30/2012, the last visit prior to that January  24th 2011. She was on Copaxone and use it without  injection site problems at the time she gets patient assistance through shared solutions prior to Copaxone she was on very brisk but could not tolerate the medication she had also been on Avonex and developed side effects she remains on Copaxone now. She has switched to Copaxone 3 times weekly injection over 3 years ago and she had no exacerbation or relapse in several years. Since she was diagnosed with breast cancer a year ago she has undergone chemotherapy and a right breast mastectomy. She has no acute problems and wants to be followed again at [facility] for MS.".    Sleep medical history: I slept good until 5 years ago. Even in my pregnancy. 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  [name] [name] was diagnosed with breast cancer in 2016, underwent mastectomy of the right breast and chemotherapy.  She also carries a diagnosis of  MS, of meralgia paresthetica, vitamin D deficiency, CAD, transient iron deficiency, obesity, depression-anxiety.  I also reviewed the patient's medication.    She continues on Copaxone for MS, ferrous gluconate for iron deficiency, Flonase as needed for rhinitis, gabapentin 3 capsules by mouth 3 times a day for neuropathy, hydrochlorothiazide for blood pressure control Linzess for irritable bowel control, Cozaar for blood pressure, metoprolol for blood pressure, Protonix for GERD, Center mine was originally prescribed for weight loss 8 she is not taking it currently.  Potassium supplement Symbicort, vitamin D supplement, trazodone 50 mg at night as needed for sleep. She takes goodie powder with caffeine for headaches.       Family sleep history:  No apnea history    Social history: single, adult children ( daughter 68, son is 88), was never married.  Children are perceived as not supportive ( I am not allowed to mention my cancer, fears and worries )  Full time gainfully employed. Non smoker, non drinker, caffeine rarely.  No regular  exercises.   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     Review of Systems: Out of a complete 14 system review, the patient complains of only the following symptoms, and all other reviewed systems are negative.  Insomnia. Neuropathy, MS history   Can't sleep on trazodone.   Epworth Sleepiness score: 3/ 24  Fatigue severity score:29/ 63  Depression score: N/A but tearful.    Social History   Socioeconomic History  . Marital status: Single    Spouse name: Not on file  . Number of children: 2  . Years of education: 50  . Highest education  level: Not on file  Occupational History  . Occupation: Solicitor: [employer]  Social Needs  . Financial resource strain: Not on file  . Food insecurity:    Worry: Not on file    Inability: Not on file  . Transportation needs:    Medical: Not on file    Non-medical: Not on file  Tobacco Use  . Smoking status: Never Smoker  . Smokeless tobacco: Never Used  Substance and Sexual Activity  . Alcohol use: No    Alcohol/week: 0.0 standard drinks  . Drug use: No  . Sexual activity: Not on file  Lifestyle  . Physical activity:    Days per week: Not on file    Minutes per session: Not on file  . Stress: Not on file  Relationships  . Social connections:    Talks on phone: Not on file    Gets together: Not on file    Attends religious service: Not on file    Active member of club or organization: Not on file    Attends meetings of clubs or organizations: Not on file    Relationship status: Not on file  . Intimate partner violence:    Fear of current or ex partner: Not on file    Emotionally abused: Not on file    Physically abused: Not on file    Forced sexual activity: Not on file  Other Topics Concern  . 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    Patient is single and lives alone.    Patient is right handed.     Family History  Problem Relation Age of Onset  . Coronary artery disease Mother   . Heart disease Mother   . Diabetes Mother   . Hypertension Mother   . Stroke Mother   . Alcohol abuse Father   . COPD Maternal Grandmother   . Hypertension Other   . Hyperlipidemia Other   . Diabetes Other   . Colon cancer Neg Hx     Past Medical History:  Diagnosis Date  . Allergic rhinitis due to other allergen   . Allergy   . Angioedema    ACEI  . Anxiety   . Breast cancer of upper-outer quadrant of right female breast ([place]) 10/23/2014   s/p r mastectomy, neoadj chemo completed 04/21/15; neg genetic testing  . Colon polyps 05/2014   adenomatous, colo q 5y  . CONSTIPATION, CHRONIC   . GERD   . Herpes zoster without complication 1/93/7902  . Hot flashes   . HYPERLIPIDEMIA   . HYPERTENSION   . Lactose intolerance   . Migraine headache   . Multiple sclerosis ([place])   . OBESITY, TRUNCAL     Past Surgical History:  Procedure Laterality Date  . BREAST BIOPSY Right    [place]   . COLONOSCOPY    . MASTECTOMY Right   . PORT-A-CATH REMOVAL Left 05/13/2015   Procedure: REMOVAL PORT-A-CATH;  Surgeon: [name], MD;  Location: [facility];  Service: General;  Laterality: Left;  . PORTACATH PLACEMENT Left 11/06/2014   Procedure: INSERTION PORT-A-CATH;  Surgeon: [name], MD;  Location: [facility];  Service: General;  Laterality: Left;  . SIMPLE MASTECTOMY WITH AXILLARY SENTINEL NODE BIOPSY Right 05/13/2015   Procedure: RIGHT TOTAL  MASTECTOMY WITH RIGHT  AXILLARY SENTINEL NODE BIOPSY;  Surgeon: [name], MD;  Location: [facility];  Service: General;  Laterality: Right;  . TUBAL LIGATION      Current Outpatient Medications  Medication Sig Dispense Refill  . ALPRAZolam (XANAX) 0.5 MG tablet Take 1 tablet (0.5 mg total) by mouth 2 (two) times daily as needed for anxiety or sleep. 30  tablet 0  . b complex vitamins tablet Take 1 tablet by mouth daily.    [name] COPAXONE 40 MG/ML SOSY 40mg /ml SQ three times weekly 36 Syringe 3  . ferrous gluconate (FERGON) 324 MG tablet Take 1 tablet (324 mg total) by mouth 2 (two) times daily with a meal. (Patient taking differently: Take 324 mg by mouth 3 (three) times a week. ) 180 tablet 3  . fluticasone (FLONASE) 50 MCG/ACT nasal spray USE 2 SPRAYS IN EACH       NOSTRIL DAILY 16 g 6  . gabapentin (NEURONTIN) 100 MG capsule TAKE 3 CAPSULES (300MG ) 3  TIMES A DAY 810 capsule 1  . hydrochlorothiazide (HYDRODIURIL) 25 MG tablet Take 1 tablet (25 mg total) by mouth daily. 90 tablet 3  . linaclotide (LINZESS) 145 MCG CAPS capsule Take 2 tablets daily 180 capsule 1  . loratadine-pseudoephedrine (CLARITIN-D 12 HOUR) 5-120 MG tablet Take 1 tablet by mouth daily as needed (seasonal allergies). 90 tablet 1  . losartan (COZAAR) 50 MG tablet Take 1 tablet (50 mg total) by mouth 2 (two) times daily. 180 tablet 3  . metoprolol tartrate (LOPRESSOR) 25 MG tablet TAKE 1 TABLET(25 MG) BY MOUTH TWICE DAILY 180 tablet 1  . mometasone (NASONEX) 50 MCG/ACT nasal spray Place 2 sprays into the nose daily. 17 g 2  . pantoprazole (PROTONIX) 40 MG tablet TAKE 1 TABLET BY MOUTH EVERY DAY 90 tablet 2  . potassium chloride (MICRO-K) 10 MEQ CR capsule TAKE 2 CAPSULES BY MOUTH TWICE DAILY 120 capsule 0  . SYMBICORT 160-4.5 MCG/ACT inhaler INHALE 2 PUFFS INTO THE LUNGS TWICE DAILY 10.2 g 0  . traZODone (DESYREL) 50 MG tablet Take 1-2 tablets (50-100 mg total) by mouth at bedtime as needed for sleep. 60 tablet 3   No current facility-administered medications for this visit.     Allergies as of 11/21/2018 - Review Complete 11/21/2018  Allergen Reaction Noted  . Ace inhibitors Anaphylaxis 10/19/2008  . Clarithromycin Anaphylaxis   . Contrave [naltrexone-bupropion hcl er]  06/15/2017  . Levaquin [levofloxacin] Other (See Comments) 08/27/2013    Vitals: BP 128/86   Pulse  (!) 104   Ht 5\' 5"  (1.651 m)   Wt 220 lb (99.8 kg)   LMP 11/01/2012   BMI 36.61 kg/m  Last Weight:  Wt Readings from Last 1 Encounters:  11/21/18 220 lb (99.8 kg)   WCB:JSEG mass index is 36.61 kg/m.     Last Height:   Ht Readings from Last 1 Encounters:  11/21/18 5\' 5"  (1.651 m)    Physical exam:  General: The patient is awake, alert and appears not in acute distress. The patient is well groomed. Head: Normocephalic, atraumatic. Neck is supple. Mallampati: 3,  neck circumference:15.5 ". Nasal airflow patent -, TMJ click in longerevident . Retrognathia is mildy seen.  Cardiovascular:  Regular rate and rhythm, without  murmurs or carotid bruit, and without distended neck veins. Respiratory:  Skin:  Without evidence of edema, or rash Trunk: BMI is 36.61kg/m2 . The patient's posture is erect.   Neurologic exam : The patient is awake and alert, oriented to place and time.   Memory subjective described as intact.  Attention span & concentration ability appears normal.  Speech is fluent,  without dysarthria, dysphonia or aphasia.  Mood and affect are appropriate.  Cranial nerves: Pupils are equal and briskly reactive to light. Funduscopic exam without evidence of pallor or edema. Extraocular movements  in vertical and horizontal planes intact and without nystagmus. Visual fields by finger perimetry are intact. Hearing to finger rub intact.   Facial sensation intact to fine touch.  Facial motor strength is symmetric and tongue and uvula move midline. Shoulder shrug was symmetrical.   Motor exam: Normal tone, muscle bulk and symmetric strength in all extremities. Strong grip strength bilaterally.   Sensory:  Fine touch, pinprick and vibration were tested in all extremities. Proprioception tested in the upper extremities was normal.  Coordination: Rapid alternating movements in the fingers/hands was normal. Finger-to-nose maneuver  normal without evidence of ataxia, dysmetria or  tremor.  Gait and station: Patient walks without assistive device and is able unassisted to climb up to the exam table. Strength within normal limits.  Stance is stable and normal. Toe stand was normal. Walking on tip toes and on heels , steadily. Tandem gait is unfragmented. Turns with 3.5  Steps.  Deep tendon reflexes: in the upper and lower extremities are symmetric and intact.    Assessment:  After physical and neurologic examination, review of laboratory studies,  Personal review of imaging studies, reports of other /same  Imaging studies, results of polysomnography and / or neurophysiology testing and pre-existing records as far as provided in visit., my assessment is:   1)  Anxiety/ depression related insomnia.  beginning with caretaker duties, forcing her to be vigilant about her mother's needs. It became somewhat more pronounced after her mother's death and her cancer diagnosis. She was lonely and alone in fighting the disease, he children reportedly don't support her very much.     The patient was advised of the nature of the diagnosed disorder , the treatment options and the  risks for general health and wellness arising from not treating the condition.   I spent more than 35 minutes of face to face time with the patient. Greater than 50% of time was spent in counseling and coordination of care. We have discussed the diagnosis and differential and I answered the patient's questions.    Plan:  Treatment plan and additional workup :  RLS - I kick the covers off every night. And Neuropathy hurts at night.  Steroids have helped , takeprednisone in AM for 14 days.   Concentrate on sleep hygiene to improve sleep quality and duration.  Eliminate electronics form the bedroom, no screen light, use audio books or calming background sounds to support sleep. Read in a book with pages!   Trazodone is a sleep aid with low risk of side effects. She can use melatonin at 5 mg or less at the same  time.   She is likely snoring, as she wakes up with a dry mouth.  HST for screening of OSA.     Larey Seat, MD 0/05/7352, 2:99 PM  Certified in Neurology by ABPN Certified in [place] by [facility] [address] [place], [place] [address]

## 2018-11-22 ENCOUNTER — Telehealth: Payer: Self-pay

## 2018-11-22 NOTE — Telephone Encounter (Signed)
PA for phentermine initiated. Waiting for response.   KEY: [id number]

## 2018-11-25 ENCOUNTER — Telehealth: Payer: Self-pay | Admitting: Neurology

## 2018-11-25 ENCOUNTER — Other Ambulatory Visit: Payer: Self-pay | Admitting: Neurology

## 2018-11-25 DIAGNOSIS — G2581 Restless legs syndrome: Secondary | ICD-10-CM

## 2018-11-25 DIAGNOSIS — F5104 Psychophysiologic insomnia: Secondary | ICD-10-CM

## 2018-11-25 DIAGNOSIS — T451X5A Adverse effect of antineoplastic and immunosuppressive drugs, initial encounter: Secondary | ICD-10-CM

## 2018-11-25 DIAGNOSIS — G620 Drug-induced polyneuropathy: Secondary | ICD-10-CM

## 2018-11-25 DIAGNOSIS — F419 Anxiety disorder, unspecified: Secondary | ICD-10-CM

## 2018-11-25 NOTE — Telephone Encounter (Signed)
[employer] state plan will not approve a hst. Please place an order for a in lab.

## 2018-11-25 NOTE — Telephone Encounter (Signed)
We will inform the pt of this informations

## 2018-11-26 ENCOUNTER — Telehealth: Payer: Self-pay

## 2018-11-26 NOTE — Telephone Encounter (Signed)
Called patient to schedule in lab sleep study. Informed patient that her insurance [facility]) does not authorize a HST. Pt stated that she was not interested in scheduling the in lab study at this time. Pt was told to call back if she changes her mind.

## 2018-11-28 ENCOUNTER — Ambulatory Visit: Payer: BC Managed Care – PPO | Admitting: Internal Medicine

## 2018-12-02 ENCOUNTER — Other Ambulatory Visit: Payer: Self-pay | Admitting: Neurology

## 2018-12-02 ENCOUNTER — Encounter: Payer: Self-pay | Admitting: Internal Medicine

## 2018-12-02 ENCOUNTER — Encounter: Payer: Self-pay | Admitting: Neurology

## 2018-12-02 MED ORDER — COPAXONE 40 MG/ML ~~LOC~~ SOSY: PREFILLED_SYRINGE | SUBCUTANEOUS | 3 refills | Status: DC

## 2018-12-03 ENCOUNTER — Institutional Professional Consult (permissible substitution): Payer: BC Managed Care – PPO | Admitting: Neurology

## 2018-12-04 LAB — HM PAP SMEAR

## 2018-12-05 ENCOUNTER — Encounter: Payer: Self-pay | Admitting: Oncology

## 2018-12-09 ENCOUNTER — Encounter: Payer: Self-pay | Admitting: Neurology

## 2018-12-11 ENCOUNTER — Other Ambulatory Visit: Payer: Self-pay | Admitting: Internal Medicine

## 2018-12-20 ENCOUNTER — Other Ambulatory Visit: Payer: Self-pay | Admitting: Internal Medicine

## 2018-12-22 NOTE — Progress Notes (Addendum)
Virtual Visit via Video Note  I connected with [name] on 12/23/18 at 10:00 AM EDT by a video enabled telemedicine application and verified that I am speaking with the correct person using two identifiers.   I discussed the limitations of evaluation and management by telemedicine and the availability of in person appointments. The patient expressed understanding and agreed to proceed.  The patient is currently at work and I am in the office.    No referring provider.    History of Present Illness: This visit is for an acute problem: sinus symptoms.   Her symptoms started the middle of last week.   She is experiencing sinus pressure by her nasal bridge, nasal congestion with discolored mucus, teeth pain, frontal headaches, ear itching.  She denies fever/chills, ear pain, sore throat, cough, wheeze, SOB, chest pain, aches, and lightheadedness/dizziness.   She has been taking Claritin-d and Nasacort.      Observations/Objective: Appears well and in NAD  Assessment and Plan:  See Problem List for Assessment and Plan of chronic medical problems.   Follow Up Instructions:    I discussed the assessment and treatment plan with the patient. The patient was provided an opportunity to ask questions and all were answered. The patient agreed with the plan and demonstrated an understanding of the instructions.   The patient was advised to call back or seek an in-person evaluation if the symptoms worsen or if the condition fails to improve as anticipated.     Binnie Rail, MD

## 2018-12-23 ENCOUNTER — Ambulatory Visit (INDEPENDENT_AMBULATORY_CARE_PROVIDER_SITE_OTHER): Payer: BC Managed Care – PPO | Admitting: Internal Medicine

## 2018-12-23 ENCOUNTER — Telehealth: Payer: Self-pay | Admitting: Internal Medicine

## 2018-12-23 ENCOUNTER — Encounter: Payer: Self-pay | Admitting: Internal Medicine

## 2018-12-23 DIAGNOSIS — J0120 Acute ethmoidal sinusitis, unspecified: Secondary | ICD-10-CM

## 2018-12-23 MED ORDER — FLUCONAZOLE 150 MG PO TABS: 150.0000 mg | ORAL_TABLET | Freq: Once | ORAL | 0 refills | Status: AC

## 2018-12-23 MED ORDER — AMOXICILLIN-POT CLAVULANATE 875-125 MG PO TABS: 1.0000 | ORAL_TABLET | Freq: Two times a day (BID) | ORAL | 0 refills | Status: DC

## 2018-12-23 NOTE — Assessment & Plan Note (Signed)
Likely bacterial  Start augmentin otc cold medications Rest, fluid Call if no improvement  

## 2018-12-23 NOTE — Telephone Encounter (Signed)
sent 

## 2018-12-23 NOTE — Telephone Encounter (Signed)
Copied from [id number]. Topic: Quick Communication - Rx Refill/Question >> Dec 23, 2018 11:12 AM Nils Flack wrote: Medication: diflucan   Has the patient contacted their pharmacy? No  (Agent: If no, request that the patient contact the pharmacy for the refill.) (Agent: If yes, when and what did the pharmacy advise?)  Preferred Pharmacy (with phone number or street name): [facility]  Pt needs diflucan after abx rx for yeast   Agent: Please be advised that RX refills may take up to 3 business days. We ask that you follow-up with your pharmacy.

## 2018-12-24 ENCOUNTER — Other Ambulatory Visit: Payer: Self-pay | Admitting: Internal Medicine

## 2018-12-24 ENCOUNTER — Telehealth: Payer: Self-pay

## 2018-12-24 NOTE — Telephone Encounter (Signed)
Received a fax from [facility] stating copaxone was shipped to the p on 12/17/18.

## 2019-01-07 ENCOUNTER — Telehealth: Payer: Self-pay | Admitting: Emergency Medicine

## 2019-01-07 NOTE — Telephone Encounter (Signed)
Copied from [place] [id number]. Topic: General - Inquiry >> Jan 07, 2019  3:26 PM Alanda Slim E wrote: Reason for CRM: Pt has been experiencing heart burn for almost 2 weeks and has been taking Protonix but wants to know if there is something else Dr. Quay Burow would recommend for relief/ please advise

## 2019-01-08 NOTE — Telephone Encounter (Signed)
Spoke with patient and she refused a virtual visit at this time. States she feels better.

## 2019-01-10 ENCOUNTER — Telehealth: Payer: Self-pay

## 2019-01-10 MED ORDER — FLUCONAZOLE 150 MG PO TABS: 150.0000 mg | ORAL_TABLET | Freq: Once | ORAL | 0 refills | Status: AC

## 2019-01-10 NOTE — Telephone Encounter (Signed)
Sent!

## 2019-01-10 NOTE — Telephone Encounter (Signed)
Pt states she lost her diflucan that was sent in a couple weeks ago. Can this be resent?

## 2019-01-10 NOTE — Telephone Encounter (Signed)
Copied from [place] [id number]. Topic: General - Other >> Jan 09, 2019 10:10 AM Lennox Solders wrote: Reason for CRM: Pt is calling and has a question for Chenelle Benning. Pt did not elaborate the reason for the call

## 2019-01-11 ENCOUNTER — Other Ambulatory Visit: Payer: Self-pay | Admitting: Internal Medicine

## 2019-01-11 DIAGNOSIS — G5711 Meralgia paresthetica, right lower limb: Secondary | ICD-10-CM

## 2019-01-13 ENCOUNTER — Ambulatory Visit: Payer: BC Managed Care – PPO | Admitting: Nurse Practitioner

## 2019-01-13 ENCOUNTER — Ambulatory Visit: Payer: BC Managed Care – PPO | Admitting: Neurology

## 2019-01-15 ENCOUNTER — Other Ambulatory Visit: Payer: Self-pay | Admitting: Internal Medicine

## 2019-01-17 ENCOUNTER — Other Ambulatory Visit: Payer: Self-pay | Admitting: Internal Medicine

## 2019-01-20 NOTE — Progress Notes (Signed)
Virtual Visit via Video Note  I connected with [name] [name] on 01/20/19 at  8:45 AM EDT by a video enabled telemedicine application and verified that I am speaking with the correct person using two identifiers.   I discussed the limitations of evaluation and management by telemedicine and the availability of in person appointments. The patient expressed understanding and agreed to proceed.  The patient is currently at home and I am in the office.    No referring provider.    History of Present Illness: This visit is for follow up of anxiety and sleep difficulties.  She has been home from work.  She feels better since being at home and has not felt this good in a while.  She has been sleeping better not working.   She is sleeping more and has good sleep quality.  Sleep difficulties:  She takes trazodone only once a week. She turns her TV off early and has been sleeping well.    She is walking 3.5 miles three times a week.   Constipation:  She takes linzess daily.  She did not have  BM for four days.  She had a pressure pain in her upper left abdomen.  She had to take prune juice and a laxative and after a BM the pain resolved.  She was concerned if this was normal or not because she is taking the medication on a daily basis.   Review of Systems  Constitutional: Negative for chills and fever.  Respiratory: Negative for cough, shortness of breath and wheezing.   Cardiovascular: Negative for chest pain and palpitations.  Gastrointestinal: Positive for abdominal pain (Resolved with a bowel movement) and constipation.  Psychiatric/Behavioral: Negative for depression. The patient is not nervous/anxious.      Social History   Socioeconomic History  . Marital status: Single    Spouse name: Not on file  . Number of children: 2  . Years of education: 62  . Highest education level: Not on file  Occupational History  . Occupation: Solicitor: [employer]   Social Needs  . Financial resource strain: Not on file  . Food insecurity:    Worry: Not on file    Inability: Not on file  . Transportation needs:    Medical: Not on file    Non-medical: Not on file  Tobacco Use  . Smoking status: Never Smoker  . Smokeless tobacco: Never Used  Substance and Sexual Activity  . Alcohol use: No    Alcohol/week: 0.0 standard drinks  . Drug use: No  . Sexual activity: Not on file  Lifestyle  . Physical activity:    Days per week: Not on file    Minutes per session: Not on file  . Stress: Not on file  Relationships  . Social connections:    Talks on phone: Not on file    Gets together: Not on file    Attends religious service: Not on file    Active member of club or organization: Not on file    Attends meetings of clubs or organizations: Not on file    Relationship status: Not on file  Other Topics Concern  . 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    Patient is single and lives alone.    Patient is right handed.      Observations/Objective: Appears well in NAD Normal  mood and affect  Assessment and Plan:  See Problem List for Assessment and Plan of chronic medical problems.   Follow Up Instructions:    I discussed the assessment and treatment plan with the patient. The patient was provided an opportunity to ask questions and all were answered. The patient agreed with the plan and demonstrated an understanding of the instructions.   The patient was advised to call back or seek an in-person evaluation if the symptoms worsen or if the condition fails to improve as anticipated.    Binnie Rail, MD

## 2019-01-21 ENCOUNTER — Ambulatory Visit (INDEPENDENT_AMBULATORY_CARE_PROVIDER_SITE_OTHER): Payer: BC Managed Care – PPO | Admitting: Internal Medicine

## 2019-01-21 ENCOUNTER — Encounter: Payer: Self-pay | Admitting: Internal Medicine

## 2019-01-21 DIAGNOSIS — G479 Sleep disorder, unspecified: Secondary | ICD-10-CM

## 2019-01-21 DIAGNOSIS — K5909 Other constipation: Secondary | ICD-10-CM | POA: Diagnosis not present

## 2019-01-21 DIAGNOSIS — F419 Anxiety disorder, unspecified: Secondary | ICD-10-CM | POA: Diagnosis not present

## 2019-01-21 MED ORDER — FLUTICASONE PROPIONATE 50 MCG/ACT NA SUSP: 2.0000 | Freq: Every day | NASAL | 6 refills | Status: DC

## 2019-01-21 NOTE — Assessment & Plan Note (Signed)
Taking trazodone only once a week She has been shutting the TV off earlier and has been getting good quality sleep Continue good sleep hygiene Continue trazodone only as needed

## 2019-01-21 NOTE — Assessment & Plan Note (Signed)
Has chronic constipation and taking Linzess 290 mcg daily Advised that is not uncommon to still have some occasional constipation Can try some natural remedies such as the prune juice, stool softener or take a laxative if needed Continue regular exercise and good water intake

## 2019-01-21 NOTE — Assessment & Plan Note (Signed)
Much improved since not working-stress level is low She also has good healthy habits-she is getting good quality sleep nightly and is exercising regularly At some point she will be going back to work and stressed the importance of maintaining good sleep and regular exercise to help combat some of that stress that will come when she returns to work No further treatment needed at this time

## 2019-01-22 ENCOUNTER — Encounter: Payer: Self-pay | Admitting: Internal Medicine

## 2019-01-27 ENCOUNTER — Other Ambulatory Visit: Payer: Self-pay | Admitting: *Deleted

## 2019-01-27 ENCOUNTER — Telehealth: Payer: Self-pay | Admitting: *Deleted

## 2019-01-27 DIAGNOSIS — C50411 Malignant neoplasm of upper-outer quadrant of right female breast: Secondary | ICD-10-CM

## 2019-01-27 DIAGNOSIS — Z170 Estrogen receptor positive status [ER+]: Secondary | ICD-10-CM

## 2019-01-27 NOTE — Telephone Encounter (Signed)
Received call from patient concerning vaginal atrophy and what she could do to relieve dryness.  Informed her she could use coconut oil or [place] liquibeads.  Also let her know that we could place a referral to PT and that many of our patients have had much success with these treatments for pelvic dysfunction. She is agreeable and I have placed a referral.

## 2019-01-29 ENCOUNTER — Other Ambulatory Visit: Payer: Self-pay | Admitting: Neurology

## 2019-02-15 ENCOUNTER — Other Ambulatory Visit: Payer: Self-pay | Admitting: Internal Medicine

## 2019-02-16 ENCOUNTER — Other Ambulatory Visit: Payer: Self-pay | Admitting: Internal Medicine

## 2019-02-17 ENCOUNTER — Encounter: Payer: Self-pay | Admitting: Physical Therapy

## 2019-02-17 ENCOUNTER — Other Ambulatory Visit: Payer: Self-pay

## 2019-02-17 ENCOUNTER — Ambulatory Visit: Payer: BC Managed Care – PPO | Attending: Oncology | Admitting: Physical Therapy

## 2019-02-17 DIAGNOSIS — M62838 Other muscle spasm: Secondary | ICD-10-CM | POA: Diagnosis not present

## 2019-02-17 DIAGNOSIS — C50411 Malignant neoplasm of upper-outer quadrant of right female breast: Secondary | ICD-10-CM | POA: Diagnosis present

## 2019-02-17 DIAGNOSIS — Z170 Estrogen receptor positive status [ER+]: Secondary | ICD-10-CM | POA: Insufficient documentation

## 2019-02-17 NOTE — Therapy (Signed)
[facility] [address], [place], [place], [address] Phone: [phone]   Fax:  [phone]  Physical Therapy Evaluation/Discharge  Patient Details  Name: [name] [name] MRN: [id number] Date of Birth: Jul 01, 1961 Referring Provider (PT): Dr. Lurline Del   Encounter Date: 02/17/2019  PT End of Session - 02/17/19 0940    Visit Number  1    Date for PT Re-Evaluation  02/24/19    PT Start Time  0900    PT Stop Time  0932    PT Time Calculation (min)  32 min    Activity Tolerance  Patient tolerated treatment well    Behavior During Therapy  [facility] for tasks assessed/performed       Past Medical History:  Diagnosis Date  . Allergic rhinitis due to other allergen   . Allergy   . Angioedema    ACEI  . Anxiety   . Breast cancer of upper-outer quadrant of right female breast ([place]) 10/23/2014   s/p r mastectomy, neoadj chemo completed 04/21/15; neg genetic testing  . Colon polyps 05/2014   adenomatous, colo q 5y  . CONSTIPATION, CHRONIC   . GERD   . Herpes zoster without complication 10/30/2480  . Hot flashes   . HYPERLIPIDEMIA   . HYPERTENSION   . Lactose intolerance   . Migraine headache   . Multiple sclerosis ([place])   . OBESITY, TRUNCAL     Past Surgical History:  Procedure Laterality Date  . BREAST BIOPSY Right    [place]   . COLONOSCOPY    . MASTECTOMY Right   . PORT-A-CATH REMOVAL Left 05/13/2015   Procedure: REMOVAL PORT-A-CATH;  Surgeon: [name], MD;  Location: [place];  Service: General;  Laterality: Left;  . PORTACATH PLACEMENT Left 11/06/2014   Procedure: INSERTION PORT-A-CATH;  Surgeon: [name], MD;  Location: [place];  Service: General;  Laterality: Left;  . SIMPLE MASTECTOMY WITH AXILLARY SENTINEL NODE BIOPSY Right 05/13/2015   Procedure: RIGHT TOTAL  MASTECTOMY WITH RIGHT  AXILLARY SENTINEL NODE BIOPSY;  Surgeon: [name], MD;  Location: [place];  Service:  General;  Laterality: Right;  . TUBAL LIGATION      There were no vitals filed for this visit.   Subjective Assessment - 02/17/19 0901    Subjective  Patient reports vaginal pain, dryness, irritation. Patient reports this has been going on for several months. Irritation was becoming progressively worse. Patient tried started patient with steroid cream, flageal. The creams were not helping. No urinary leakage. Patient is using coconut oil and KY liquid beads.     Patient Stated Goals  education on vaginal dryness    Currently in Pain?  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 Sarajane Jews Magrinat    Onset Date/Surgical Date  10/23/14    Prior Therapy  none      Precautions   Precautions  Other (comment)    Precaution Comments  breast cancer      Restrictions   Weight Bearing Restrictions  No      Balance Screen   Has the patient fallen in the past 6 months  No    Has the patient had a decrease in activity level because of a fear of falling?  No    Is the patient reluctant to leave their home because of a fear of falling?   No      Home Film/video editor residence      Prior Function   Level of Independence  Independent    Vocation  Full time employment    Vocation Requirements  sitting    Leisure  walks 3 times per week for 1-3 miles      Cognition   Overall Cognitive Status  Within Functional Limits for tasks assessed      Posture/Postural Control   Posture/Postural Control  No significant limitations      ROM / Strength   AROM / PROM / Strength  AROM;PROM;Strength      AROM   Overall AROM Comments  lumbar ROM is full      Strength   Overall Strength Comments  bil. lower extremity is 5/5                Objective measurements completed on  examination: See above findings.    Pelvic Floor Special Questions - 02/17/19 0001    Prior Pregnancies  Yes    Number of Vaginal Deliveries  2    Urinary Leakage  No       [facility] Adult PT Treatment/Exercise - 02/17/19 0001      Self-Care   Self-Care  Other Self-Care Comments    Other Self-Care Comments   instruction on using vaginal lubricants and moisturizers, pernieal care, how to massage the vulva and intoitus             PT Education - 02/17/19 0937    Education Details  education on vaginal moisturizers, lubricants, how to massage the perineum, how to take care of the perineum    Person(s) Educated  Patient    Methods  Explanation;Demonstration;Verbal cues;Handout    Comprehension  Returned demonstration;Verbalized understanding          PT Long Term Goals - 02/17/19 0947      PT LONG TERM GOAL #1   Title  understand what moisturizers and lubricants to use to reduce vaginal irritation    Time  1    Period  Days    Status  Achieved    Target Date  02/17/19      PT LONG TERM GOAL #2   Title  understand how to massage the perineal area to improve the blood flow     Time  1    Period  Days    Status  Achieved    Target Date  02/17/19      PT LONG TERM GOAL #3   Title  education on on using lavender oil while taking a bath to not irritate the vaginal area    Time  1    Period  Days    Status  Achieved    Target Date  02/17/19             Plan - 02/17/19 0941    Clinical Impression Statement  Patient is a 58 year old female with vaginal dryness and atrophy. Patient is s/p right breast cancer estrogen positive. Patient reports vagina dryness that increases her pain and irritation in the vaginal making it difficult to sit. Patient has started to use coconut oil with good results. Patient was educated on how to moisturize the vaginal canal and vulva, lubricants to use for intercourse, massaging the vaginal area to promote blood flow and use  of lavender  oil in her baths.     Personal Factors and Comorbidities  Comorbidity 1;Comorbidity 2    Comorbidities  right estrogen positive breast cancer; multiple sclerosis    Examination-Activity Limitations  Hygiene/Grooming    Stability/Clinical Decision Making  Stable/Uncomplicated    Clinical Decision Making  Low    Rehab Potential  Excellent    PT Frequency  One time visit    PT Duration  Other (comment)   1 time visit for eval and discharge   PT Treatment/Interventions  Patient/family education    PT Next Visit Plan  patient education on perineal care, vaginal moisturizers, vaginal lubricants    Consulted and Agree with Plan of Care  Patient       Patient will benefit from skilled therapeutic intervention in order to improve the following deficits and impairments:  Other (comment)(vaginal atrophy with irritation)  Visit Diagnosis: Other muscle spasm - Plan: PT plan of care cert/re-cert  Malignant neoplasm of upper-outer quadrant of right breast in female, 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HCC) - Plan: PT plan of care cert/re-cert     Problem List Patient Active Problem List   Diagnosis Date Noted  . Acute non-recurrent ethmoidal sinusitis 12/23/2018  . Psychophysiological insomnia 11/21/2018  . Neuropathy due to chemotherapeutic drug (HCC) 11/21/2018  . MS (multiple sclerosis) (HCC) 11/21/2018  . RLS (restless legs syndrome) 11/21/2018  . Sleep difficulties 08/06/2018  . Trigger finger 01/23/2018  . Meralgia paraesthetica, right 11/05/2017  . Iron deficiency 10/09/2017  . Vitamin D deficiency 06/15/2017  . Lumbar radiculopathy 04/21/2017  . Trigger index finger of left hand 04/21/2017  . Obesity (BMI 35.0-39.9 without comorbidity) 04/21/2017  . Left hip pain 10/31/2016  . Pes planus 10/31/2016  . Posterior tibialis muscle dysfunction 10/31/2016  . Memory difficulties 09/03/2016  . Breast pain, right 08/24/2016  . Palpitations 04/04/2016  . 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 Malignant  neoplasm of upper-outer quadrant of right breast in female, 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/03/2015  . S/P mastectomy 05/13/2015  . Chemotherapy-induced neuropathy (HCC) 04/21/2015  . Genetic testing 12/11/2014  . Breast cancer of upper-outer quadrant of right female breast (HCC) 10/23/2014  . Anxiety 04/29/2012  . Allergic rhinitis 11/29/2009  . 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 CONSTIPATION, CHRONIC 08/17/2008  . Dyslipidemia 04/24/2008  . METABOLIC SYNDROME X 01/07/2008  . GERD 01/07/2008  . Essential hypertension 09/20/2007  . Multiple sclerosis (HCC) 09/18/2007    Cheryl Gray, PT 02/17/19 9:52 AM   [facility] [address], [address] [place], [place], [address] Phone: [phone]   Fax:  [phone]  Name: [name] [name] MRN: [id number] Date of Birth: 01/03/1961  PHYSICAL THERAPY DISCHARGE SUMMARY  Visits from Start of Care: 1  Current functional level related to goals / functional outcomes: See above.    Remaining deficits: See above.   Education / Equipment: HEP Plan: Patient agrees to discharge.  Patient goals were met. Patient is being discharged due to meeting the stated rehab goals.  Thank you for the referral. Cheryl Gray, PT 02/17/19 9:53 AM  ?????      

## 2019-02-17 NOTE — Patient Instructions (Signed)
Moisturizers . They are used in the vagina to hydrate the mucous membrane that make up the vaginal canal. . Designed to keep a more normal acid balance (ph) . Once placed in the vagina, it will last between two to three days.  . Use 2-3 times per week at bedtime and last longer than 60 min. . Ingredients to avoid is glycerin and fragrance, can increase chance of infection . Should not be used just before sex due to causing irritation . Most are gels administered either in a tampon-shaped applicator or as a vaginal suppository. They are non-hormonal.   Types of Moisturizers( use in the vaginal canal 2-3 times per week) . Samul Dada- drug store . Vitamin E vaginal suppositories- Whole foods, Amazon . Moist Again . Coconut oil- can break down condoms . Julva- (Do no use if on Tamoxifen) amazon . Yes moisturizer- amazon . NeuEve Silk , NeuEve Silver for menopausal or over 65 (if have severe vaginal atrophy or cancer treatments use NeuEve Silk for  1 month than move to [employer])- [employer], MapleFlower.dk . Olive and Bee intimate cream- www.oliveandbee.com.au . Mae vaginal moisturizer- Amazon  Creams to use externally on the Vulva area( use daily)  Desert [employer] (good for for cancer patients that had radiation to the area)- [place] or [employer].FlyingBasics.com.br  V-magic cream - amazon  Julva-amazon  Vital "V [place] salve ( help moisturize and help with thinning vulvar area, does have [place] by Irwin Brakeman labial moisturizer (Amazon,   Coconut oil   Things to avoid in the vaginal area . Do not use things to irritate the vulvar area . No lotions just specialized creams for the vulva area- Neogyn, V-magic, No soaps; can use Aveeno or Calendula cleanser if needed. Must be gentle . No deodorants . No douches . Good to sleep without underwear to let the vaginal area to air out . No scrubbing: spread the lips  to let warm water rinse over labias and pat dry . Can use the lavender essential oil in the bath   Lubrication . Used for intercourse to reduce friction . Avoid ones that have glycerin, warming gels, tingling gels, icing or cooling gel, scented . Avoid parabens due to a preservative similar to female sex hormone . May need to be reapplied once or several times during sexual activity . Can be applied to both partners genitals prior to vaginal penetration to minimize friction or irritation . Prevent irritation and mucosal tears that cause post coital pain and increased the risk of vaginal and urinary tract infections . Oil-based lubricants cannot be used with condoms due to breaking them down.  Least likely to irritate vaginal tissue.  . Plant based-lubes are safe . Silicone-based lubrication are thicker and last long and used for post-menopausal women  Vaginal Lubricators Here is a list of some suggested lubricators you can use for intercourse. Use the most hypoallergenic product.  You can place on you or your partner.   Slippery Stuff  Sylk or Sliquid Natural H2O ( good  if frequent UTI's)  Blossom Organics (www.blossom-organics.com)  Luvena   Coconut oil  PJur Woman Nude- water based lubricant, amazon  Uberlube- Amazon  Aloe Vera  Yes lubricant- [employer] Platinum-Silicone, Target, [facility]reens  Olive and Bee intimate cream-  www.oliveandbee.com.au  Pink - Amazon Things to avoid in lubricants are glycerin, warming gels, tingling gels, icing or cooling  gels, and scented gels.  Also avoid Vaseline. KY jelly, Replens, and Astroglide kills good bacteria(lactobacilli)  Things to avoid in the vaginal area . Do not use things to irritate the vulvar area . No lotions- see below . No soaps; can use Aveeno or Calendula cleanser if needed. Must be gentle . No deodorants . No douches . Good to sleep without underwear to let the vaginal area to air out . No scrubbing: spread  the lips to let warm water rinse over labias and pat dry  Creams that can be used on the [place] Releveum or [place]vest Gele   Around the vulva area you can perform circular massage to improve the circulation  STRETCHING THE PELVIC FLOOR MUSCLES NO DILATOR  Supplies . Vaginal lubricant . Mirror (optional) . Gloves (optional) Positioning . Start in a semi-reclined position with your head propped up. Bend your knees and place your thumb or finger at the vaginal opening. Procedure . Apply a moderate amount of lubricant on the outer skin of your vagina, the labia minora.  Apply additional lubricant to your finger. [name] Spread the skin away from the vaginal opening. Place the end of your finger at the opening. . Do a maximum contraction of the pelvic floor muscles. Tighten the vagina and the anus maximally and relax. . When you know they are relaxed, gently and slowly insert your finger into your vagina, directing your finger slightly downward, for 2-3 inches of insertion. . Relax and stretch the 6 o'clock position . Hold each stretch for _2 min__ and repeat __1_ time with rest breaks of _1__ seconds between each stretch. . Repeat the stretching in the 4 o'clock and 8 o'clock positions. . Total time should be _6__ minutes, _1__ x per day.  Note the amount of theme your were able to achieve and your tolerance to your finger in your vagina. . Once you have accomplished the techniques you may try them in standing with one foot resting on the tub, or in other positions.  This is a good stretch to do in the shower if you don't need to use lubricant.  [facility] [address] [place],  [address] Phone # [phone] Fax [phone]

## 2019-02-27 ENCOUNTER — Encounter: Payer: Self-pay | Admitting: Internal Medicine

## 2019-02-28 ENCOUNTER — Encounter: Payer: Self-pay | Admitting: Internal Medicine

## 2019-02-28 ENCOUNTER — Other Ambulatory Visit: Payer: Self-pay

## 2019-02-28 ENCOUNTER — Ambulatory Visit (INDEPENDENT_AMBULATORY_CARE_PROVIDER_SITE_OTHER): Payer: BC Managed Care – PPO | Admitting: Internal Medicine

## 2019-02-28 VITALS — BP 146/88 | HR 103 | Temp 98.5°F | Resp 16 | Ht 65.0 in | Wt 217.8 lb

## 2019-02-28 DIAGNOSIS — R002 Palpitations: Secondary | ICD-10-CM

## 2019-02-28 DIAGNOSIS — I4892 Unspecified atrial flutter: Secondary | ICD-10-CM | POA: Diagnosis not present

## 2019-02-28 DIAGNOSIS — I10 Essential (primary) hypertension: Secondary | ICD-10-CM | POA: Diagnosis not present

## 2019-02-28 DIAGNOSIS — G5711 Meralgia paresthetica, right lower limb: Secondary | ICD-10-CM

## 2019-02-28 MED ORDER — LOSARTAN POTASSIUM 50 MG PO TABS: 50.0000 mg | ORAL_TABLET | Freq: Every day | ORAL | 1 refills | Status: DC

## 2019-02-28 MED ORDER — HYDROCHLOROTHIAZIDE 25 MG PO TABS: 25.0000 mg | ORAL_TABLET | Freq: Every day | ORAL | 1 refills | Status: DC

## 2019-02-28 MED ORDER — METOPROLOL TARTRATE 25 MG PO TABS: 25.0000 mg | ORAL_TABLET | Freq: Two times a day (BID) | ORAL | 1 refills | Status: DC

## 2019-02-28 MED ORDER — POTASSIUM CHLORIDE ER 10 MEQ PO CPCR: ORAL_CAPSULE | ORAL | 1 refills | Status: DC

## 2019-02-28 MED ORDER — CLARITIN-D 12 HOUR 5-120 MG PO TB12: 1.0000 | ORAL_TABLET | Freq: Every day | ORAL | 1 refills | Status: DC | PRN

## 2019-02-28 MED ORDER — LOSARTAN POTASSIUM 50 MG PO TABS: 50.0000 mg | ORAL_TABLET | Freq: Two times a day (BID) | ORAL | 1 refills | Status: DC

## 2019-02-28 MED ORDER — TRAZODONE HCL 50 MG PO TABS: 50.0000 mg | ORAL_TABLET | Freq: Every evening | ORAL | 3 refills | Status: DC | PRN

## 2019-02-28 MED ORDER — GABAPENTIN 100 MG PO CAPS: 300.0000 mg | ORAL_CAPSULE | Freq: Three times a day (TID) | ORAL | 1 refills | Status: DC

## 2019-02-28 MED ORDER — METOPROLOL TARTRATE 25 MG PO TABS: 50.0000 mg | ORAL_TABLET | Freq: Two times a day (BID) | ORAL | 1 refills | Status: DC

## 2019-02-28 MED ORDER — RIVAROXABAN 20 MG PO TABS: 20.0000 mg | ORAL_TABLET | Freq: Every day | ORAL | 5 refills | Status: DC

## 2019-02-28 MED ORDER — LINACLOTIDE 145 MCG PO CAPS: 290.0000 ug | ORAL_CAPSULE | Freq: Every day | ORAL | 1 refills | Status: DC

## 2019-02-28 NOTE — Telephone Encounter (Signed)
Spoke with pt. Will have her come in today for an appointment.

## 2019-02-28 NOTE — Assessment & Plan Note (Signed)
Slightly elevated but typically well controlled Will increase metoprolol to 50 mg BID and decrease losartan to 50 mg daily She will monitor BP at home.

## 2019-02-28 NOTE — Assessment & Plan Note (Signed)
Palpitations just over one week No associated symptoms EKG shows aflutter, which is new - Aflutter at 115 bpm, this is new compared to prior EKG from 2018 when she had NSR with occasional PAC's Increase metoprolol to 50 mg BID Start xarelto 20 mg daily  Refer to cardiology Blood work a few months ago normal - will not repeat at this time

## 2019-02-28 NOTE — Patient Instructions (Addendum)
Your EKG shows Atrial flutter.    Start xarelto 20 mg daily.  Samples were given - take it with dinner.  A prescription was sent to your pharmacy.     Decrease losartan to 50 once a day   Increase metoprolol 50 mg twice daily   A referral was ordered for cardiology.       Atrial Flutter  Atrial flutter is a type of abnormal heart rhythm (arrhythmia). The heart has an electrical system that tells the heart how to beat. In atrial flutter, the signals move rapidly in the top chambers of the heart (the atria). This makes your heart beat very fast. Atrial flutter can come and go, or it can be permanent. If this condition is not treated it can cause serious complications, such as stroke or weakened heart muscle (cardiomyopathy). What are the causes? This condition may be caused by:  A heart condition or problem, such as: ? A heart attack. ? Heart failure. ? A heart valve problem. ? Heart surgery.  A lung problem, such as: ? A blood clot in the lungs (pulmonary embolism, or PE). ? Chronic obstructive pulmonary disease.  Poorly controlled high blood pressure (hypertension).  Overactive thyroid (hyperthyroidism).  Caffeine.  Some decongestant cold medicines.  Low levels of minerals called electrolytes in the blood.  Cocaine. What increases the risk? You are more likely to develop this condition if:  You are an elderly adult.  You are a man.  You are obese.  You have obstructive sleep apnea.  You have a family history of atrial flutter.  You have diabetes. What are the signs or symptoms? Symptoms of this condition include:  A feeling that your heart is pounding or racing (palpitations).  Shortness of breath.  Chest pain.  Feeling light-headed.  Dizziness.  Fainting.  Low blood pressure (hypotension).  Fatigue. Sometimes there are no symptoms associated with arrhythmia. How is this diagnosed? This condition may be diagnosed based on:  An  electrocardiogram (ECG). This is a test that records the electrical signals in the heart.  Ambulatory cardiac monitoring. This is a small recording device that is connected by wires to flat, sticky disks (electrodes) that are attached to your chest.  An echocardiogram. This is a test that uses sound waves to create pictures of your heart.  A transesophageal echocardiogram (TEE). In this test, a device is placed down your esophagus. This device then uses sounds waves to create even closer pictures of your heart.  Stress test. This test records your heartbeat while you exercise and checks to see if the heart muscle is receiving adequate blood supply. How is this treated? This condition may be treated with:  Medicines to: ? Make your heart beat more slowly. ? Keep your heart in normal rhythm. ? Prevent a stroke.  Cardioversion. This uses medicines or an electrical shock to make the heart beat normally.  Ablation. This destroys the heart tissue that is causing the problem. In some cases, your health care provider will treat other underlying conditions. Follow these instructions at home: Medicines  Take over-the-counter and prescription medicines only as told by your health care provider. ? Make sure you take your medicines exactly as told by your health care provider. ? Do not miss any doses.  Do not take any new medicines without talking to your health care provider. Lifestyle  Eat heart-healthy foods. Talk with a dietitian to make an eating plan that is right for you.  Do not use any products that  contain nicotine or tobacco, such as cigarettes and e-cigarettes. If you need help quitting, ask your health care provider.  Limit alcohol intake to no more than 1 drink per day for nonpregnant women and 2 drinks per day for men. One drink equals 12 oz of beer, 5 oz of wine, or 1 oz of hard liquor.  Try to reduce any stress. Stress can make your symptoms worse.  Get screened for sleep  apnea. If you have the condition, work with your health care provider to find a treatment that works for you.  Do not use drugs.  Avoid excessive caffeine. General instructions  Lose weight if your health care provider tells you to do that.  Keep all follow-up visits as told by your health care provider. This is important. Contact a health care provider if:  Your symptoms get worse.  You notice that your palpitations are increasing. Get help right away if:  You have any symptoms of a stroke. "BE FAST" is an easy way to remember the main warning signs of a stroke: ? B - Balance. Signs are dizziness, sudden trouble walking, or loss of balance. ? E - Eyes. Signs are trouble seeing or a sudden change in vision. ? F - Face. Signs are sudden weakness or numbness of the face, or the face or eyelid drooping on one side. ? A - Arms. Signs are weakness or numbness in an arm. This happens suddenly and usually on one side of the body. ? S - Speech. Signs are sudden trouble speaking, slurred speech, or trouble understanding what people say. ? T - Time. Time to call emergency services. Write down what time symptoms started.  You have other signs of a stroke, such as: ? A sudden, severe headache with no known cause. ? Nausea or vomiting. ? Seizure.  You have additional symptoms, such as: ? Fainting. ? Shortness of breath. ? Pain or pressure in your chest. ? Suddenly feeling nauseous or suddenly vomiting. ? Increased sweating with no known cause.  These symptoms may represent a serious problem that is an emergency. Do not wait to see if the symptoms will go away. Get medical help right away. Call your local emergency services (911 in the [place] Do not drive yourself to the hospital. Summary  Atrial flutter is an abnormal heart rhythm that can give you symptoms of palpitations, shortness of breath, or fatigue.  Atrial flutter is often treated with medicines to keep your heart in a normal  rhythm and to prevent a stroke.  You should seek immediate help if you cannot catch your breath, have chest pain or pressure, or have weakness, especially on one side of your body. This information is not intended to replace advice given to you by your health care provider. Make sure you discuss any questions you have with your health care provider. Document Released: 01/21/2009 Document Revised: 05/24/2018 Document Reviewed: 06/07/2017 Elsevier Interactive Patient Education  2019 [employer].

## 2019-02-28 NOTE — Progress Notes (Signed)
Subjective:    Patient ID: [name] [name], female    DOB: 17-Oct-1960, 58 y.o.   MRN: [id number]  HPI The patient is here for an acute visit for palpitations.   She is stressed. She has been back at work and her stress is back.  She denies changes in prescription medication.  She does not drink any caffeine.  She s taking a couple of new supplements but nothing with caffeine - a new b complex and probiotic.     Palpitations:  She started having palpitations a little over a week ago.  They are intermittent.  They can last about an hour or so and then goes away.  Her watch has her HR - it varies and has been over 100 several times.  No associated chest pain, lightheadedness, SOB, dizzienss or nausea or diaphoresis.   She is taking all of her medications as prescribed.    Medications and allergies reviewed with patient and updated if appropriate.  Patient Active Problem List   Diagnosis Date Noted  . Acute non-recurrent ethmoidal sinusitis 12/23/2018  . Psychophysiological insomnia 11/21/2018  . Neuropathy due to chemotherapeutic drug ([place]) 11/21/2018  . MS (multiple sclerosis) ([place]) 11/21/2018  . RLS (restless legs syndrome) 11/21/2018  . Sleep difficulties 08/06/2018  . Trigger finger 01/23/2018  . Meralgia paraesthetica, right 11/05/2017  . Iron deficiency 10/09/2017  . Vitamin D deficiency 06/15/2017  . Lumbar radiculopathy 04/21/2017  . Trigger index finger of left hand 04/21/2017  . Obesity (BMI 35.0-39.9 without comorbidity) 04/21/2017  . Left hip pain 10/31/2016  . Pes planus 10/31/2016  . Posterior tibialis muscle dysfunction 10/31/2016  . Memory difficulties 09/03/2016  . Breast pain, right 08/24/2016  . Palpitations 04/04/2016  . 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 Malignant neoplasm of upper-outer quadrant of right breast in female, 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[place]) 11/03/2015  . S/P mastectomy 05/13/2015  . Chemotherapy-induced neuropathy ([place]) 04/21/2015  . Genetic  testing 12/11/2014  . Breast cancer of upper-outer quadrant of right female breast ([place]) 10/23/2014  . Anxiety 04/29/2012  . Allergic rhinitis 11/29/2009  . 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 CONSTIPATION, CHRONIC 08/17/2008  . Dyslipidemia 04/24/2008  . METABOLIC SYNDROME X 29/51/8841  . GERD 01/07/2008  . Essential hypertension 09/20/2007  . Multiple sclerosis ([place]) 09/18/2007    Current Outpatient Medications on File Prior to Visit  Medication Sig Dispense Refill  . b complex vitamins tablet Take 1 tablet by mouth daily.    [name] COPAXONE 40 MG/ML SOSY 40mg /ml SQ three times weekly 36 Syringe 3  . ferrous gluconate (FERGON) 324 MG tablet Take 1 tablet (324 mg total) by mouth 2 (two) times daily with a meal. (Patient taking differently: Take 324 mg by mouth 3 (three) times a week. ) 180 tablet 3  . fluticasone (FLONASE) 50 MCG/ACT nasal spray Place 2 sprays into both nostrils daily. 16 g 6  . pantoprazole (PROTONIX) 40 MG tablet TAKE 1 TABLET BY MOUTH EVERY DAY 90 tablet 1  . SYMBICORT 160-4.5 MCG/ACT inhaler INHALE 2 PUFFS INTO THE LUNGS TWICE DAILY 10.2 g 0   No current facility-administered medications on file prior to visit.     Past Medical History:  Diagnosis Date  . Allergic rhinitis due to other allergen   . Allergy   . Angioedema    ACEI  . Anxiety   . Breast cancer of upper-outer quadrant of right female breast ([place]) 10/23/2014   s/p r mastectomy, neoadj chemo completed 04/21/15; neg genetic testing  . Colon  polyps 05/2014   adenomatous, colo q 5y  . CONSTIPATION, CHRONIC   . GERD   . Herpes zoster without complication 2/59/5638  . Hot flashes   . HYPERLIPIDEMIA   . HYPERTENSION   . Lactose intolerance   . Migraine headache   . Multiple sclerosis ([place])   . OBESITY, TRUNCAL     Past Surgical History:  Procedure Laterality Date  . BREAST BIOPSY Right    [place]   . COLONOSCOPY    . MASTECTOMY Right   . PORT-A-CATH REMOVAL Left 05/13/2015   Procedure: REMOVAL PORT-A-CATH;  Surgeon:  [name], MD;  Location: [place];  Service: General;  Laterality: Left;  . PORTACATH PLACEMENT Left 11/06/2014   Procedure: INSERTION PORT-A-CATH;  Surgeon: [name], MD;  Location: [place];  Service: General;  Laterality: Left;  . SIMPLE MASTECTOMY WITH AXILLARY SENTINEL NODE BIOPSY Right 05/13/2015   Procedure: RIGHT TOTAL  MASTECTOMY WITH RIGHT  AXILLARY SENTINEL NODE BIOPSY;  Surgeon: [name], MD;  Location: [place];  Service: General;  Laterality: Right;  . TUBAL LIGATION      Social History   Socioeconomic History  . Marital status: Single    Spouse name: Not on file  . Number of children: 2  . Years of education: 67  . Highest education level: Not on file  Occupational History  . Occupation: Solicitor: [employer]  Social Needs  . Financial resource strain: Not on file  . Food insecurity    Worry: Not on file    Inability: Not on file  . Transportation needs    Medical: Not on file    Non-medical: Not on file  Tobacco Use  . Smoking status: Never Smoker  . Smokeless tobacco: Never Used  Substance and Sexual Activity  . Alcohol use: No    Alcohol/week: 0.0 standard drinks  . Drug use: No  . Sexual activity: Not on file  Lifestyle  . Physical activity    Days per week: Not on file    Minutes per session: Not on file  . Stress: Not on file  Relationships  . Social Herbalist on phone: Not on file    Gets together: Not on file    Attends religious service: Not on file    Active member of club or organization: Not on file    Attends meetings of clubs or organizations: Not on file    Relationship status: Not on file  Other Topics Concern  . 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    Patient is single and lives alone.    Patient is right handed.     Family History  Problem Relation Age  of Onset  . Coronary artery disease Mother   . Heart disease Mother   . Diabetes Mother   . Hypertension Mother   . Stroke Mother   . Alcohol abuse Father   . COPD Maternal Grandmother   . Hypertension Other   . Hyperlipidemia Other   . Diabetes Other   . Colon cancer Neg Hx     Review of Systems  Constitutional: Negative for chills and fever.  Respiratory: Negative for cough, shortness of breath and wheezing.   Cardiovascular: Positive for palpitations. Negative for chest pain and leg swelling.  Neurological: Negative for light-headedness and headaches.  Objective:   Vitals:   02/28/19 1505  BP: (!) 146/88  Pulse: (!) 103  Resp: 16  Temp: 98.5 F (36.9 C)  SpO2: 98%   BP Readings from Last 3 Encounters:  02/28/19 (!) 146/88  11/21/18 128/86  10/16/18 134/72   Wt Readings from Last 3 Encounters:  02/28/19 217 lb 12.8 oz (98.8 kg)  11/21/18 220 lb (99.8 kg)  10/16/18 218 lb 12.8 oz (99.2 kg)   Body mass index is 36.24 kg/m.   Physical Exam    Constitutional: Appears well-developed and well-nourished. No distress.  HENT:  Head: Normocephalic and atraumatic.  Neck: Neck supple. No tracheal deviation present. No thyromegaly present.  No cervical lymphadenopathy Cardiovascular: Normal rate, slightly irregular rhythm and normal heart sounds.   No murmur heard. No carotid bruit .  No edema Pulmonary/Chest: Effort normal and breath sounds normal. No respiratory distress. No has no wheezes. No rales.  Skin: Skin is warm and dry. Not diaphoretic.  Psychiatric: Normal mood and affect. Behavior is normal.    Lab Results  Component Value Date   WBC 6.5 10/16/2018   HGB 13.2 10/16/2018   HCT 38.3 10/16/2018   PLT 254.0 10/16/2018   GLUCOSE 88 10/16/2018   CHOL 202 (H) 10/16/2018   TRIG 166.0 (H) 10/16/2018   HDL 48.00 10/16/2018   LDLDIRECT 119.0 10/09/2017   LDLCALC 121 (H) 10/16/2018   ALT 18 10/16/2018   AST 18 10/16/2018   NA 140 10/16/2018   K 3.9  10/16/2018   CL 102 10/16/2018   CREATININE 0.85 10/16/2018   BUN 14 10/16/2018   CO2 29 10/16/2018   TSH 2.26 10/16/2018   HGBA1C 5.4 02/21/2017      Assessment & Plan:    See Problem List for Assessment and Plan of chronic medical problems.

## 2019-03-03 ENCOUNTER — Other Ambulatory Visit: Payer: Self-pay

## 2019-03-03 MED ORDER — LOSARTAN POTASSIUM 50 MG PO TABS: 50.0000 mg | ORAL_TABLET | Freq: Every day | ORAL | 1 refills | Status: DC

## 2019-03-04 ENCOUNTER — Encounter: Payer: Self-pay | Admitting: Internal Medicine

## 2019-03-04 NOTE — Telephone Encounter (Signed)
Pt wants something to help clean out her bowels. Is there any recommendations?

## 2019-03-12 ENCOUNTER — Ambulatory Visit: Payer: BC Managed Care – PPO | Admitting: Cardiology

## 2019-03-14 ENCOUNTER — Telehealth: Payer: Self-pay

## 2019-03-14 NOTE — Telephone Encounter (Signed)
Left message for patient to return phone call. Need to find out if she can do in office visit instead of virtual. If patient prefers virtual need to get consent.

## 2019-03-23 NOTE — Progress Notes (Signed)
Virtual Visit via Video Note   This visit type was conducted due to national recommendations for restrictions regarding the COVID-19 Pandemic (e.g. social distancing) in an effort to limit this patient's exposure and mitigate transmission in our community.  Due to her co-morbid illnesses, this patient is at least at moderate risk for complications without adequate follow up.  This format is felt to be most appropriate for this patient at this time.  All issues noted in this document were discussed and addressed.  A limited physical exam was performed with this format.  Please refer to the patient's chart for her consent to telehealth for [facility].   Evaluation Performed:  Cardiology Consult  This visit type was conducted due to national recommendations for restrictions regarding the COVID-19 Pandemic (e.g. social distancing).  This format is felt to be most appropriate for this patient at this time.  All issues noted in this document were discussed and addressed.  No physical exam was performed (except for noted visual exam findings with Video Visits).  Please refer to the patient's chart (MyChart message for video visits and phone note for telephone visits) for the patient's consent to telehealth for [facility].  Date:  03/24/2019   ID:  [name] [name], DOB 1960/11/09, MRN [id number]  Patient Location:  Home  Provider location:   [place]  PCP:  Binnie Rail, MD  Cardiologist:  NEW Electrophysiologist:  None   Chief Complaint:  New onset atrial flutter  History of Present Illness:    [name] [name] is a 58 y.o. female who presents via audio/video conferencing for a telehealth visit today in referral by Billey Gosling, MD for evaluation of new onset atrial flutter.   This is a 58yo female with a history MS, insomnia, RLS, obesity and HTN who recently saw her PCP because of palpitations.  Apparently her work is very stressful for her and her palpitations started a few  weeks ago.  She denies any caffeine intake, no supplements except a B complex vitamin and probiotic.  The palpitations are sporadic and last up to an hour at a time.  Her HR goes above 100bpm on occasional.  There are no associated sx of SOB, dizziness, diaphoresis, CP.  She was seen by her PCP on 6/12 and EKG showed atrial flutter with RVR at 115bpm.  Her metoprolol was increased to 50mg  BID and she was started on Xarelto 20mg  daily.  2D echo in 2016 showed normal LVF.  She has not missed any doses of her Xarelto since starting.  She still notices occasional palpitations but not all the time.  She denies any CP, SOB, DOE, PND, orthopnea, LE edema, dizziness or syncope.   The patient does not have symptoms concerning for COVID-19 infection (fever, chills, cough, or new shortness of breath).    Prior CV studies:   The following studies were reviewed today:  none  Past Medical History:  Diagnosis Date  . Acute non-recurrent maxillary sinusitis 01/08/2017  . Allergic rhinitis due to other allergen   . Allergy   . Angioedema    ACEI  . 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 Anxiety   . Breast cancer of upper-outer quadrant of right female breast ([place]) 10/23/2014   s/p r mastectomy, neoadj chemo completed 04/21/15; neg genetic testing  . 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  . CHEST PAIN-UNSPECIFIED 05/19/2010   Qualifier: Diagnosis of  By: Aundra Dubin, MD, [place]    . Chronic fatigue 06/08/2016  . Colon polyps 05/2014  adenomatous, colo q 5y  . Conjunctiva disorder 08/06/2018  . CONSTIPATION, CHRONIC   . Cough 04/25/2016  . Depression 09/03/2016  . Ear canal dryness 01/23/2018  . FATIGUE 06/24/2008   Qualifier: Diagnosis of  By: Wynona Luna   . GERD   . Herpes zoster without complication 6/43/3295  . Hot flashes   . HYPERLIPIDEMIA   . HYPERTENSION   . Hypokalemia 12/10/2014  . Lactose intolerance   . LRTI (lower respiratory tract infection) 09/05/2017  . LUQ  cramping 04/21/2017  . Migraine headache   . MIGRAINES, HX OF 09/18/2007   Qualifier: Diagnosis of  By: Danelle Earthly CMA, Darlene    . MS (multiple sclerosis) ([place]) 11/03/2015  . Multiple sclerosis ([place])   . Muscle strain 10/21/2016  . OBESITY, TRUNCAL   . OTHER SYMPTOMS INVOLVING DIGESTIVE SYSTEM OTHER 12/14/2009   Qualifier: Diagnosis of  By: Asa Lente MD, Jannifer Rodney   . Otitis, externa, infective 05/24/2016  . PERSONAL HX COLONIC POLYPS 04/21/2009   Qualifier: Diagnosis of  By: Trellis Paganini PA-c, Amy S   . SINUSITIS 01/14/2010   Qualifier: Diagnosis of  By: Asa Lente MD, Jannifer Rodney Upper respiratory tract infection 09/23/2016  . UTI 08/19/2010   Qualifier: Diagnosis of  By: Marca Ancona RMA, [place]    . Wheezing 09/10/2017   Past Surgical History:  Procedure Laterality Date  . BREAST BIOPSY Right    [place]   . COLONOSCOPY    . MASTECTOMY Right   . PORT-A-CATH REMOVAL Left 05/13/2015   Procedure: REMOVAL PORT-A-CATH;  Surgeon: [name], MD;  Location: [place];  Service: General;  Laterality: Left;  . PORTACATH PLACEMENT Left 11/06/2014   Procedure: INSERTION PORT-A-CATH;  Surgeon: [name], MD;  Location: [place];  Service: General;  Laterality: Left;  . SIMPLE MASTECTOMY WITH AXILLARY SENTINEL NODE BIOPSY Right 05/13/2015   Procedure: RIGHT TOTAL  MASTECTOMY WITH RIGHT  AXILLARY SENTINEL NODE BIOPSY;  Surgeon: [name], MD;  Location: [place];  Service: General;  Laterality: Right;  . TUBAL LIGATION       Current Meds  Medication Sig  . b complex vitamins tablet Take 1 tablet by mouth daily.  . ferrous gluconate (FERGON) 324 MG tablet Take 1 tablet (324 mg total) by mouth 2 (two) times daily with a meal. (Patient taking differently: Take 324 mg by mouth 3 (three) times a week. )  . fluticasone (FLONASE) 50 MCG/ACT nasal spray Place 2 sprays into both nostrils daily.  [name] gabapentin (NEURONTIN) 100 MG capsule Take 3 capsules (300 mg total) by mouth 3 (three) times daily.  [name]  glatiramer (COPAXONE) 20 MG/ML SOSY injection Copaxone 20 mg/mL subcutaneous syringe  . hydrochlorothiazide (HYDRODIURIL) 25 MG tablet Take 1 tablet (25 mg total) by mouth daily.  [name] linaclotide (LINZESS) 145 MCG CAPS capsule Take 2 capsules (290 mcg total) by mouth daily.  [name] loratadine-pseudoephedrine (CLARITIN-D 12 HOUR) 5-120 MG tablet Take 1 tablet by mouth daily as needed (seasonal allergies).  . losartan (COZAAR) 50 MG tablet Take 1 tablet (50 mg total) by mouth daily.  . metoprolol tartrate (LOPRESSOR) 25 MG tablet Take 2 tablets (50 mg total) by mouth 2 (two) times daily.  . pantoprazole (PROTONIX) 40 MG tablet TAKE 1 TABLET BY MOUTH EVERY DAY  . potassium chloride (MICRO-K) 10 MEQ CR capsule TAKE 2 CAPSULES(20 MEQ) BY MOUTH TWICE DAILY  . Probiotic Product (PROBIOTIC PO) Take 2 capsules by mouth daily.  . rivaroxaban (XARELTO) 20 MG TABS tablet  Take 1 tablet (20 mg total) by mouth daily with supper.  . SYMBICORT 160-4.5 MCG/ACT inhaler INHALE 2 PUFFS INTO THE LUNGS TWICE DAILY  . traZODone (DESYREL) 50 MG tablet Take 1-2 tablets (50-100 mg total) by mouth at bedtime as needed for sleep.  . TURMERIC PO Take by mouth daily.  [name] VITAMIN D PO Take 2,000 Units by mouth daily.     Allergies:   Ace inhibitors, Clarithromycin, Contrave [naltrexone-bupropion hcl er], and Levaquin [levofloxacin]   Social History   Tobacco Use  . Smoking status: Never Smoker  . Smokeless tobacco: Never Used  Substance Use Topics  . Alcohol use: No    Alcohol/week: 0.0 standard drinks  . Drug use: No     Family Hx: The patient's family history includes Alcohol abuse in her father; COPD in her maternal grandmother; Coronary artery disease in her mother; Diabetes in her mother and another family member; Heart disease in her mother; Hyperlipidemia in an other family member; Hypertension in her mother and another family member; Stroke in her mother. There is no history of Colon cancer.  ROS:   Please see the  history of present illness.     All other systems reviewed and are negative.   Labs/Other Tests and Data Reviewed:    Recent Labs: 10/16/2018: ALT 18; BUN 14; Creatinine, Ser 0.85; Hemoglobin 13.2; Platelets 254.0; Potassium 3.9; Sodium 140; TSH 2.26   Recent Lipid Panel Lab Results  Component Value Date/Time   CHOL 202 (H) 10/16/2018 10:12 AM   CHOL 204 (H) 02/21/2017 10:27 AM   TRIG 166.0 (H) 10/16/2018 10:12 AM   HDL 48.00 10/16/2018 10:12 AM   HDL 47 02/21/2017 10:27 AM   CHOLHDL 4 10/16/2018 10:12 AM   LDLCALC 121 (H) 10/16/2018 10:12 AM   LDLCALC 121 (H) 02/21/2017 10:27 AM   LDLDIRECT 119.0 10/09/2017 10:03 AM    Wt Readings from Last 3 Encounters:  03/24/19 216 lb (98 kg)  02/28/19 217 lb 12.8 oz (98.8 kg)  11/21/18 220 lb (99.8 kg)     Objective:    Vital Signs:  Ht 5\' 5"  (1.651 m)   Wt 216 lb (98 kg)   LMP 11/01/2012   BMI 35.94 kg/m    CONSTITUTIONAL:  Well nourished, well developed female in no acute distress.  EYES: anicteric MOUTH: oral mucosa is pink RESPIRATORY: Normal respiratory effort, symmetric expansion CARDIOVASCULAR: No peripheral edema SKIN: No rash, lesions or ulcers MUSCULOSKELETAL: no digital cyanosis NEURO: Cranial Nerves II-XII grossly intact, moves all extremities PSYCH: Intact judgement and insight.  A&O x 3, Mood/affect appropriate   ASSESSMENT & PLAN:    1.  New onset atrial flutter - she has not had any further palpitations since her BB was increased.  She has not had any problems with bleeding on Xarelto.  She will continue on Lopressor 50mg  BID and Xarelto.  She has been on Xarelto for 3 weeks.  I will set up an EKG in the office and if still in aflutter, then set up for DCCV.  I will check 2D echo to assess LAE and LVF. Her TSH was normal at 09/2018.  I will get a home sleep study to rule out OSA.  Her creatinine was stable at 0.85 on 10/16/2018.  2.  Hypertension - her BP is controlled.  She will continue on Lopressor 50mg  BID,  HCTZ 25mg  daily and Losartan 50mg  daily.    3.  Obesity - I have encouraged ger to get into a routine exercise  program and cut back on carbs and portions.    COVID-19 Education: The signs and symptoms of COVID-19 were discussed with the patient and how to seek care for testing (follow up with PCP or arrange E-visit).  The importance of social distancing was discussed today.  Patient Risk:   After full review of this patient's clinical status, I feel that they are at least moderate risk at this time.  Time:   Today, I have spent 35 minutes directly with telemedicine on video discussing medical problems including atrial flutter, HTN.  We also reviewed the symptoms of COVID 19 and the ways to protect against contracting the virus with telehealth technology.  I spent an additional 5  minutes reviewing patient's chart including OV notes, labs.  Medication Adjustments/Labs and Tests Ordered: Current medicines are reviewed at length with the patient today.  Concerns regarding medicines are outlined above.  Tests Ordered: No orders of the defined types were placed in this encounter.  Medication Changes: No orders of the defined types were placed in this encounter.   Disposition:  Follow up in 6 month(s)  Signed, Fransico Him, MD  03/24/2019 9:22 AM    [facility]

## 2019-03-24 ENCOUNTER — Telehealth: Payer: Self-pay | Admitting: *Deleted

## 2019-03-24 ENCOUNTER — Encounter: Payer: Self-pay | Admitting: Cardiology

## 2019-03-24 ENCOUNTER — Telehealth: Payer: Self-pay

## 2019-03-24 ENCOUNTER — Other Ambulatory Visit: Payer: Self-pay

## 2019-03-24 ENCOUNTER — Telehealth (INDEPENDENT_AMBULATORY_CARE_PROVIDER_SITE_OTHER): Payer: BC Managed Care – PPO | Admitting: Cardiology

## 2019-03-24 VITALS — Ht 65.0 in | Wt 216.0 lb

## 2019-03-24 DIAGNOSIS — I10 Essential (primary) hypertension: Secondary | ICD-10-CM

## 2019-03-24 DIAGNOSIS — I4892 Unspecified atrial flutter: Secondary | ICD-10-CM

## 2019-03-24 DIAGNOSIS — E669 Obesity, unspecified: Secondary | ICD-10-CM

## 2019-03-24 DIAGNOSIS — K219 Gastro-esophageal reflux disease without esophagitis: Secondary | ICD-10-CM

## 2019-03-24 NOTE — Patient Instructions (Signed)
Medication Instructions:  Your physician recommends that you continue on your current medications as directed. Please refer to the Current Medication list given to you today.  If you need a refill on your cardiac medications before your next appointment, please call your pharmacy.   Lab work: None If you have labs (blood work) drawn today and your tests are completely normal, you will receive your results only by: [name] MyChart Message (if you have MyChart) OR . A paper copy in the mail If you have any lab test that is abnormal or we need to change your treatment, we will call you to review the results.  Testing/Procedures: Your physician has requested that you have an echocardiogram, scheduled on 03/27/19, arrive at 2:50 pm. Echocardiography is a painless test that uses sound waves to create images of your heart. It provides your doctor with information about the size and shape of your heart and how well your heart's chambers and valves are working. This procedure takes approximately one hour. There are no restrictions for this procedure.  Your physician has recommended that you have a sleep study. This test records several body functions during sleep, including: brain activity, eye movement, oxygen and carbon dioxide blood levels, heart rate and rhythm, breathing rate and rhythm, the flow of air through your mouth and nose, snoring, body muscle movements, and chest and belly movement.  EKG: In office, on 03/27/19 after Echo.   Follow-Up: At [facility], you and your health needs are our priority.  As part of our continuing mission to provide you with exceptional heart care, we have created designated Provider Care Teams.  These Care Teams include your primary Cardiologist (physician) and Advanced Practice Providers (APPs -  Physician Assistants and Nurse Practitioners) who all work together to provide you with the care you need, when you need it. You will need a follow up appointment in 6 months.   Please call our office 2 months in advance to schedule this appointment.  You may see Dr. Radford Pax or one of the following Advanced Practice Providers on your designated Care Team:   [place], PA-C Melina Copa, PA-C . Ermalinda Barrios, PA-C

## 2019-03-24 NOTE — Telephone Encounter (Signed)

## 2019-03-24 NOTE — Telephone Encounter (Signed)
-----   Message from Sarina Ill, RN sent at 03/24/2019 10:10 AM EDT ----- Sleep study ordered. Thanks, Suezanne Jacquet ----- Message ----- From: Sueanne Margarita, MD Sent: 03/24/2019   8:48 AM EDT To: Sarina Ill, RN  2D echo - atrial flutter, needs EKG in office this week to see if still in aflutter (can be done the same day as echo), in lab sleep study. FOllowup with m ein 6 months. Please write a note in my chart that she has access to stating that she had this appt today with me for her work

## 2019-03-25 ENCOUNTER — Telehealth: Payer: Self-pay | Admitting: *Deleted

## 2019-03-25 NOTE — Telephone Encounter (Signed)
-----   Message from Sarina Ill, RN sent at 03/24/2019 10:10 AM EDT ----- Sleep study ordered. Thanks, Suezanne Jacquet ----- Message ----- From: Sueanne Margarita, MD Sent: 03/24/2019   8:48 AM EDT To: Sarina Ill, RN  2D echo - atrial flutter, needs EKG in office this week to see if still in aflutter (can be done the same day as echo), in lab sleep study. FOllowup with m ein 6 months. Please write a note in my chart that she has access to stating that she had this appt today with me for her work

## 2019-03-25 NOTE — Telephone Encounter (Signed)
Staff message sent to Gae Bon per phone call today with Saquon H. @ [employer] no PA is required for sleep study. Ok  to schedule.

## 2019-03-27 ENCOUNTER — Other Ambulatory Visit: Payer: Self-pay

## 2019-03-27 ENCOUNTER — Telehealth: Payer: Self-pay

## 2019-03-27 ENCOUNTER — Ambulatory Visit (HOSPITAL_COMMUNITY): Payer: BC Managed Care – PPO | Attending: Cardiovascular Disease

## 2019-03-27 DIAGNOSIS — I4892 Unspecified atrial flutter: Secondary | ICD-10-CM | POA: Diagnosis present

## 2019-03-27 NOTE — Telephone Encounter (Signed)
Patient Name: [name] [name]        DOB: 11/05/1960      Height: 5'5"     Weight: 216 lb  Office Name: [place] [facility].         Referring Provider: Dr. Fransico Him  Date:  03/27/19 STOP BANG RISK ASSESSMENT S (snore) Have you been told that you snore?     NO   T (tired) Are you often tired, fatigued, or sleepy during the day?   YES  O (obstruction) Do you stop breathing, choke, or gasp during sleep? NO   P (pressure) Do you have or are you being treated for high blood pressure? YES   B (BMI) Is your body index greater than 35 kg/m? YE   A (age) Are you 57 years old or older? YES   N (neck) Do you have a neck circumference greater than 16 inches?   N/A   G (gender) Are you a female? NO   TOTAL STOP/BANG "YES" ANSWERS 3                                                                       For Office Use Only              Procedure Order Form    YES to 3+ Stop Bang questions OR two clinical symptoms - patient qualifies for WatchPAT (CPT 95800)     Submit: This Form + Patient Face Sheet + Clinical Note via Cloud[employer] or Fax: [phone]         Clinical Notes: Will consult Sleep Specialist and refer for management of therapy due to patient increased risk of Sleep Apnea. Ordering a sleep study due to the following two clinical symptoms: Excessive daytime sleepiness G47.10 / Gastroesophageal reflux K21.9 / Nocturia R35.1 / Morning Headaches G44.221 / Difficulty concentrating R41.840 / Memory problems or poor judgment G31.84 / Personality changes or irritability R45.4 / Loud snoring R06.83 / Depression F32.9 / Unrefreshed by sleep G47.8 / Impotence N52.9 / History of high blood pressure R03.0 / Insomnia G47.00    I understand that I am proceeding with a home sleep apnea test as ordered by my treating physician. I understand that untreated sleep apnea is a serious cardiovascular risk factor and it is my responsibility to perform the test and seek  management for sleep apnea. I will be contacted with the results and be managed for sleep apnea by a local sleep physician. I will be receiving equipment and further instructions from [facility]. I shall promptly ship back the equipment via the included mailing label. I understand my insurance will be billed for the test and as the patient I am responsible for any insurance related out-of-pocket costs incurred. I have been provided with written instructions and can call for additional video or telephonic instruction, with 24-hour availability of qualified personnel to answer any questions: Patient Help Desk [phone].  Patient Signature ______________________________________________________   Date______________________ Patient Telemedicine Verbal Consent

## 2019-03-27 NOTE — Telephone Encounter (Signed)
Yes please do the [place]  ----- Message -----  From: Sarina Ill, RN  Sent: 03/25/2019  1:32 PM EDT  To: Sueanne Margarita, MD  Subject: QD:IYMEB visit Summary for Dr. Radford Pax        Can we order a home sleep study.   ----- Message -----  From: [name]  Sent: 03/25/2019 11:31 AM EDT  To: Sarina Ill, RN  Subject: RA:XENMM visit Summary for Dr. Alpha Gula, I know that she has said that she was going to order a sleep study. My concern is going to the hospital with all this mess going around. Is it possible to see if she will do a at home sleep test. I talked with the insurance company and they said that it will require a prior approval through [facility] [employer] at [phone]. Just a thought. Let me know. Thanks in advance.

## 2019-03-28 NOTE — Addendum Note (Signed)
Addended by: Freada Bergeron on: 03/28/2019 02:13 PM   Modules accepted: Orders

## 2019-03-28 NOTE — Telephone Encounter (Signed)
Spoke to patient and she asked for a HST through [facility] C[facility]) so this in lab test has been cancelled and her covid test.

## 2019-03-31 ENCOUNTER — Other Ambulatory Visit (HOSPITAL_COMMUNITY): Payer: BC Managed Care – PPO

## 2019-04-02 NOTE — Progress Notes (Signed)
[name] [name]  [phone] [phone]) [phone]     ID: [name] [name] DOB: [birth year]  MR#: [id number]  PRF#:[id number]  Patient Care Team: Binnie Rail, MD as PCP - General (Internal Medicine) [name], MD as Consulting Physician (Obstetrics and Gynecology) [name], MD (Gastroenterology) Ellwood Dense., MD (Neurology) [name], MD as Consulting Physician (General Surgery) Corrinna Karapetyan, Virgie Dad, MD as Consulting Physician (Oncology) [name], MD as Attending Physician (Radiation Oncology) [name], RN as Registered Nurse [name], RN as Registered Nurse Jake Shark, Johny Blamer, NP as Nurse Practitioner (Nurse Practitioner) Lyndal Pulley, DO as Attending Physician (Family Medicine) OTHER MD:  CHIEF COMPLAINT: Triple negative breast cancer (s/p right mastectomy)  CURRENT TREATMENT: Observation   INTERVAL HISTORY: [name] [name] returns today for follow-up of her triple negative breast cancer. She continues under observation.   Since her last visit, she underwent left screening mammography with tomography at [place] on 11/01/2018 showing: breast density category C; no evidence of malignancy.   REVIEW OF SYSTEMS: [name] [name] takes gabapentin for her neuropathy, and she reports this helps her a lot. She was recently placed on blood thinners for palpitations and tachycardia, but a recent EKG was normal. 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 She states she is out in the open and that some people don't wear masks. She wears her mask, but she is scared every day she goes in. She reports she has started back exercising, stating she walks up to 3 miles 3 days a week (Mondays, Wednesdays, and Fridays). She states she feels great and that the exercise helps this. A detailed review of systems was otherwise stable.     BREAST CANCER HISTORY: From the original intake note:  [name] [name] had bilateral screening [facility]. Breast density was category C. Exam was unremarkable. In January 2016 however the patient palpated a change in her right breast laterally. She contacted her gynecologist, Dr. [name], and he set her up for a unilateral right diagnostic mammography with right breast ultrasonography at [facility] 10/15/2014. There was a focal irregular opacity in the lateral right breast associated with pleomorphic calcifications. This was palpable on exam as a firm non-mobile mass. Ultrasound confirmed a hypoechoic mass in the right breast at the 9:00 position 5 cm from the nipple, measuring 2.9 cm. Next to this mass was a normal-sized intramammary lymph node measuring 4 mm. In the right axilla there were 3 contiguous level I lymph nodes the largest measuring 11 mm.  On 10/20/2014 the patient underwent right core needle biopsy of the 9:00 breast mass, showing ([id number]) and invasive ductal carcinoma, grade 3, triple negative, with the HER-2/neu signals ratio of 1.26 and number per cell 2.15. The MIB-1 was 71%. Biopsy of the largest right axillary lymph node was negative. Dr. Owens Shark the mammographer who performed a biopsy describes this is concordant.  Left breast mammography 10/26/2014 was negative, and bilateral breast MRI the same day confirmed, in the right breast, and upper outer quadrant mass measuring 2.5 cm, with a second mass measuring 1.4 cm slightly anterior and superior to it. In addition there was a total area of approximately 6.5 cm of non-masslike enhancement extending throughout the upper outer quadrant of the right breast. There was a cortically thickened right axillary lymph node which had been biopsied. There was also a mildly cortically thickened right subpectoral lymph node. There were no internal mammary or left axillary lymph nodes in the  left breast was unremarkable.  The patient's subsequent history is as detailed below.   PAST MEDICAL HISTORY: Past Medical History:   Diagnosis Date  . Acute non-recurrent maxillary sinusitis 01/08/2017  . Allergic rhinitis due to other allergen   . Allergy   . Angioedema    ACEI  . 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 Anxiety   . Breast cancer of upper-outer quadrant of right female breast ([place]) 10/23/2014   s/p r mastectomy, neoadj chemo completed 04/21/15; neg genetic testing  . 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  . CHEST PAIN-UNSPECIFIED 05/19/2010   Qualifier: Diagnosis of  By: Aundra Dubin, MD, [place]    . Chronic fatigue 06/08/2016  . Colon polyps 05/2014   adenomatous, colo q 5y  . Conjunctiva disorder 08/06/2018  . CONSTIPATION, CHRONIC   . Cough 04/25/2016  . Depression 09/03/2016  . Ear canal dryness 01/23/2018  . FATIGUE 06/24/2008   Qualifier: Diagnosis of  By: Wynona Luna   . GERD   . Herpes zoster without complication 3/47/4259  . Hot flashes   . HYPERLIPIDEMIA   . HYPERTENSION   . Hypokalemia 12/10/2014  . Lactose intolerance   . LRTI (lower respiratory tract infection) 09/05/2017  . LUQ cramping 04/21/2017  . Migraine headache   . MIGRAINES, HX OF 09/18/2007   Qualifier: Diagnosis of  By: Danelle Earthly CMA, Darlene    . MS (multiple sclerosis) ([place]) 11/03/2015  . Multiple sclerosis ([place])   . Muscle strain 10/21/2016  . OBESITY, TRUNCAL   . OTHER SYMPTOMS INVOLVING DIGESTIVE SYSTEM OTHER 12/14/2009   Qualifier: Diagnosis of  By: Asa Lente MD, Jannifer Rodney   . Otitis, externa, infective 05/24/2016  . PERSONAL HX COLONIC POLYPS 04/21/2009   Qualifier: Diagnosis of  By: Trellis Paganini PA-c, Amy S   . SINUSITIS 01/14/2010   Qualifier: Diagnosis of  By: Asa Lente MD, Jannifer Rodney Upper respiratory tract infection 09/23/2016  . UTI 08/19/2010   Qualifier: Diagnosis of  By: Marca Ancona RMA, Lucy    . Wheezing 09/10/2017    PAST SURGICAL HISTORY: Past Surgical History:  Procedure Laterality Date  . BREAST BIOPSY Right    [place]   . COLONOSCOPY    . MASTECTOMY Right   . PORT-A-CATH REMOVAL Left 05/13/2015    Procedure: REMOVAL PORT-A-CATH;  Surgeon: [name], MD;  Location: [place];  Service: General;  Laterality: Left;  . PORTACATH PLACEMENT Left 11/06/2014   Procedure: INSERTION PORT-A-CATH;  Surgeon: [name], MD;  Location: [place];  Service: General;  Laterality: Left;  . SIMPLE MASTECTOMY WITH AXILLARY SENTINEL NODE BIOPSY Right 05/13/2015   Procedure: RIGHT TOTAL  MASTECTOMY WITH RIGHT  AXILLARY SENTINEL NODE BIOPSY;  Surgeon: [name], MD;  Location: [place];  Service: General;  Laterality: Right;  . TUBAL LIGATION      FAMILY HISTORY Family History  Problem Relation Age of [place]  . Coronary artery disease Mother   . Heart disease Mother   . Diabetes Mother   . Hypertension Mother   . Stroke Mother   . Alcohol abuse Father   . COPD Maternal Grandmother   . Hypertension Other   . Hyperlipidemia Other   . Diabetes Other   . Colon cancer Neg Hx    the patient's father died at the age of 50 following his seizure. The patient's mother died from congestive heart failure at the age of 72. The patient had 5 brothers and 2 sisters. One brother died from Escherichia coli infection, the  other one had a history of seizure disorder. One sister died from causes unknown to the patient. There is no history of breast or ovarian cancer in the family.   GYNECOLOGIC HISTORY:  Patient's last menstrual period was 11/01/2012. Menarche age 71, first live birth age 51. The patient is GX P2. She stopped having periods in May 2014. She did not take hormone replacement. She did take her control pills remotely for approximately 4 years with no complications.   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 She describes herself is single, and lives by herself. Her daughter [name] lives in [place] and works in Ambulance person for [employer]. Son [place] "[name]" Kotara is an Engineer, building services for [employer]. The patient has 1 grand son who graduated from high school June 2017    ADVANCED DIRECTIVES: Not in place   HEALTH MAINTENANCE: Social History   Tobacco Use  . Smoking status: Never Smoker  . Smokeless tobacco: Never Used  Substance Use Topics  . Alcohol use: No    Alcohol/week: 0.0 standard drinks  . Drug use: No     Colonoscopy: September 2015/[name]  PAP: February 2015  Bone density:  Lipid panel:  Allergies  Allergen Reactions  . Ace Inhibitors Anaphylaxis     Angioedema (tongue swelling)  . Clarithromycin Anaphylaxis    Throat swells  . Contrave [Naltrexone-Bupropion Hcl Er]     Chest and sob  . Levaquin [Levofloxacin] Other (See Comments)    Tendon pain - shoulder and calf    Current Outpatient Medications  Medication Sig Dispense Refill  . b complex vitamins tablet Take 1 tablet by mouth daily.    . ferrous gluconate (FERGON) 324 MG tablet Take 1 tablet (324 mg total) by mouth 2 (two) times daily with a meal. (Patient taking differently: Take 324 mg by mouth 3 (three) times a week. ) 180 tablet 3  . fluticasone (FLONASE) 50 MCG/ACT nasal spray Place 2 sprays into both nostrils daily. 16 g 6  . gabapentin (NEURONTIN) 100 MG capsule Take 3 capsules (300 mg total) by mouth 3 (three) times daily. 810 capsule 1  . glatiramer (COPAXONE) 20 MG/ML SOSY injection Copaxone 20 mg/mL subcutaneous syringe    . hydrochlorothiazide (HYDRODIURIL) 25 MG tablet Take 1 tablet (25 mg total) by mouth daily. 90 tablet 1  . linaclotide (LINZESS) 145 MCG CAPS capsule Take 2 capsules (290 mcg total) by mouth daily. 180 capsule 1  . loratadine-pseudoephedrine (CLARITIN-D 12 HOUR) 5-120 MG tablet Take 1 tablet by mouth daily as needed (seasonal allergies). 90 tablet 1  . losartan (COZAAR) 50 MG tablet Take 1 tablet (50 mg total) by mouth daily. 90 tablet 1  . metoprolol tartrate (LOPRESSOR) 25 MG tablet Take 2 tablets (50 mg total) by mouth 2 (two) times daily. 360 tablet 1   . pantoprazole (PROTONIX) 40 MG tablet TAKE 1 TABLET BY MOUTH EVERY DAY 90 tablet 1  . potassium chloride (MICRO-K) 10 MEQ CR capsule TAKE 2 CAPSULES(20 MEQ) BY MOUTH TWICE DAILY 360 capsule 1  . Probiotic Product (PROBIOTIC PO) Take 2 capsules by mouth daily.    . rivaroxaban (XARELTO) 20 MG TABS tablet Take 1 tablet (20 mg total) by mouth daily with supper. 30 tablet 5  . SYMBICORT 160-4.5 MCG/ACT inhaler INHALE 2 PUFFS INTO THE LUNGS TWICE DAILY 10.2 g 0  . traZODone (DESYREL) 50 MG tablet Take 1-2 tablets (50-100 mg total) by mouth at  bedtime as needed for sleep. 60 tablet 3  . TURMERIC PO Take by mouth daily.    [name] VITAMIN D PO Take 2,000 Units by mouth daily.     No current facility-administered medications for this visit.     OBJECTIVE: Middle-aged African-American woman who appears stated age  58:   04/03/19 0909  BP: 123/74  [name]: 74  Resp: 18  Temp: 98.2 F (36.8 C)  SpO2: 99%     Body mass index is 35.93 kg/m.    ECOG FS:1 - Symptomatic but completely ambulatory   Sclerae unicteric, pupils round and equal Wearing a mask No cervical or supraclavicular adenopathy Lungs no rales or rhonchi Heart regular rate and rhythm Abd soft, nontender, positive bowel sounds MSK no focal spinal tenderness, no upper extremity lymphedema Neuro: nonfocal, well oriented, appropriate affect Breasts: Status post right mastectomy with no evidence of chest wall recurrence.  The left breast is benign.  Both axillae are benign.  LAB RESULTS:  CMP     Component Value Date/Time   NA 142 04/03/2019 0847   NA 142 04/05/2017 1040   K 3.8 04/03/2019 0847   K 4.5 04/05/2017 1040   CL 106 04/03/2019 0847   CO2 25 04/03/2019 0847   CO2 25 04/05/2017 1040   GLUCOSE 96 04/03/2019 0847   GLUCOSE 89 04/05/2017 1040   BUN 17 04/03/2019 0847   BUN 18.6 04/05/2017 1040   CREATININE 0.92 04/03/2019 0847   CREATININE 1.0 04/05/2017 1040   CALCIUM 9.7 04/03/2019 0847   CALCIUM 10.2 04/05/2017  1040   PROT 7.7 04/03/2019 0847   PROT 7.7 04/05/2017 1040   ALBUMIN 4.2 04/03/2019 0847   ALBUMIN 4.2 04/05/2017 1040   AST 18 04/03/2019 0847   AST 17 04/05/2017 1040   ALT 19 04/03/2019 0847   ALT 17 04/05/2017 1040   ALKPHOS 89 04/03/2019 0847   ALKPHOS 101 04/05/2017 1040   BILITOT 0.6 04/03/2019 0847   BILITOT 0.56 04/05/2017 1040   GFRNONAA >60 04/03/2019 0847   GFRAA >60 04/03/2019 0847    INo results found for: SPEP, UPEP  Lab Results  Component Value Date   WBC 5.4 04/03/2019   NEUTROABS 2.9 04/03/2019   HGB 12.6 04/03/2019   HCT 37.8 04/03/2019   MCV 87.7 04/03/2019   PLT 264 04/03/2019      Chemistry      Component Value Date/Time   NA 142 04/03/2019 0847   NA 142 04/05/2017 1040   K 3.8 04/03/2019 0847   K 4.5 04/05/2017 1040   CL 106 04/03/2019 0847   CO2 25 04/03/2019 0847   CO2 25 04/05/2017 1040   BUN 17 04/03/2019 0847   BUN 18.6 04/05/2017 1040   CREATININE 0.92 04/03/2019 0847   CREATININE 1.0 04/05/2017 1040      Component Value Date/Time   CALCIUM 9.7 04/03/2019 0847   CALCIUM 10.2 04/05/2017 1040   ALKPHOS 89 04/03/2019 0847   ALKPHOS 101 04/05/2017 1040   AST 18 04/03/2019 0847   AST 17 04/05/2017 1040   ALT 19 04/03/2019 0847   ALT 17 04/05/2017 1040   BILITOT 0.6 04/03/2019 0847   BILITOT 0.56 04/05/2017 1040       No results found for: LABCA2  No components found for: LABCA125  No results for input(s): INR in the last 168 hours.  Urinalysis    Component Value Date/Time   COLORURINE YELLOW 05/30/2016 1619   APPEARANCEUR CLEAR 05/30/2016 1619   LABSPEC 1.010 05/30/2016  [place] 6.0 05/30/2016 1619   GLUCOSEU NEGATIVE 05/30/2016 1619   HGBUR NEGATIVE 05/30/2016 1619   HGBUR negative 08/19/2010 [place] 05/30/2016 1619   KETONESUR NEGATIVE 05/30/2016 1619   UROBILINOGEN 0.2 05/30/2016 1619   NITRITE NEGATIVE 05/30/2016 1619   LEUKOCYTESUR NEGATIVE 05/30/2016 1619    STUDIES: No results  found.   ASSESSMENT: 58 y.o. [place] woman status post left breast upper outer quadrant biopsy 10/20/2014 for a clinical T2 N0, stage IIA invasive ductal carcinoma, grade 3, triple negative, with an MIB-1 of 71%  (a) right axillary lymph node biopsy negative 10/30/2014  (b) biopsy of posterior extent of right breast calcifications show ductal carcinoma is situ, high grade, with microinvasion  (1) genetics testing through the Breast High/Moderate Risk gene panel offered by [facility] found no deleterious mutations in ATM, BRCA1, BRCA2, CDH1, CHEK2, PALB2, PTEN, STK11, or TP53.   2) neoadjuvant chemotherapy consisting of cyclophosphamide and doxorubicin in dose dense fashion 4 given with Neulasta support, completed 01/06/2015, followed by weekly carboplatin and paclitaxel 12 completed 04/21/2015  (3) status post right mastectomy and sentinel lymph node sampling 05/13/2015 for a ypT1c ypN0 invasive ductal carcinoma, high-grade, with ample margins.  (4) adjuvant radiation not necessary  PLAN: [name] [name] is now 4 years out from definitive surgery for her breast cancer with no evidence of disease recurrence.  This is very favorable.  We reviewed her mammography which still shows moderately dense breasts.  Hopefully this will decrease with time.  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  She will see me again in 1 year.  That will likely be her "graduation visit".  Amaiya Scruton, Virgie Dad, MD  04/03/19 9:30 AM Medical Oncology and Hematology [facility] [address] [place], [place] [address] Tel. [phone]    Fax. [phone]   I, Wilburn Mylar, am acting as scribe for Dr. Virgie Dad. Placida Cambre.  I, Lurline Del MD, have reviewed the above documentation for accuracy and  completeness, and I agree with the above.

## 2019-04-03 ENCOUNTER — Ambulatory Visit (HOSPITAL_BASED_OUTPATIENT_CLINIC_OR_DEPARTMENT_OTHER): Payer: BC Managed Care – PPO

## 2019-04-03 ENCOUNTER — Other Ambulatory Visit: Payer: Self-pay

## 2019-04-03 ENCOUNTER — Inpatient Hospital Stay: Payer: BC Managed Care – PPO | Attending: Oncology

## 2019-04-03 ENCOUNTER — Encounter: Payer: Self-pay | Admitting: Oncology

## 2019-04-03 ENCOUNTER — Inpatient Hospital Stay (HOSPITAL_BASED_OUTPATIENT_CLINIC_OR_DEPARTMENT_OTHER): Payer: BC Managed Care – PPO | Admitting: Oncology

## 2019-04-03 VITALS — BP 123/74 | HR 74 | Temp 98.2°F | Resp 18 | Ht 65.0 in | Wt 215.9 lb

## 2019-04-03 DIAGNOSIS — Z9221 Personal history of antineoplastic chemotherapy: Secondary | ICD-10-CM

## 2019-04-03 DIAGNOSIS — I10 Essential (primary) hypertension: Secondary | ICD-10-CM

## 2019-04-03 DIAGNOSIS — C50412 Malignant neoplasm of upper-outer quadrant of left female breast: Secondary | ICD-10-CM | POA: Diagnosis present

## 2019-04-03 DIAGNOSIS — C50411 Malignant neoplasm of upper-outer quadrant of right female breast: Secondary | ICD-10-CM

## 2019-04-03 DIAGNOSIS — Z171 Estrogen receptor negative status [ER-]: Secondary | ICD-10-CM | POA: Diagnosis not present

## 2019-04-03 DIAGNOSIS — Z79899 Other long term (current) drug therapy: Secondary | ICD-10-CM

## 2019-04-03 DIAGNOSIS — Z923 Personal history of irradiation: Secondary | ICD-10-CM | POA: Diagnosis not present

## 2019-04-03 DIAGNOSIS — Z9011 Acquired absence of right breast and nipple: Secondary | ICD-10-CM

## 2019-04-03 DIAGNOSIS — Z7951 Long term (current) use of inhaled steroids: Secondary | ICD-10-CM | POA: Insufficient documentation

## 2019-04-03 DIAGNOSIS — Z7901 Long term (current) use of anticoagulants: Secondary | ICD-10-CM | POA: Diagnosis not present

## 2019-04-03 DIAGNOSIS — Z170 Estrogen receptor positive status [ER+]: Secondary | ICD-10-CM

## 2019-04-03 LAB — CBC WITH DIFFERENTIAL/PLATELET
Abs Immature Granulocytes: 0.01 10*3/uL (ref 0.00–0.07)
Basophils Absolute: 0 10*3/uL (ref 0.0–0.1)
Basophils Relative: 0 %
Eosinophils Absolute: 0.1 10*3/uL (ref 0.0–0.5)
Eosinophils Relative: 2 %
HCT: 37.8 % (ref 36.0–46.0)
Hemoglobin: 12.6 g/dL (ref 12.0–15.0)
Immature Granulocytes: 0 %
Lymphocytes Relative: 34 %
Lymphs Abs: 1.9 10*3/uL (ref 0.7–4.0)
MCH: 29.2 pg (ref 26.0–34.0)
MCHC: 33.3 g/dL (ref 30.0–36.0)
MCV: 87.7 fL (ref 80.0–100.0)
Monocytes Absolute: 0.5 10*3/uL (ref 0.1–1.0)
Monocytes Relative: 9 %
Neutro Abs: 2.9 10*3/uL (ref 1.7–7.7)
Neutrophils Relative %: 55 %
Platelets: 264 10*3/uL (ref 150–400)
RBC: 4.31 MIL/uL (ref 3.87–5.11)
RDW: 14 % (ref 11.5–15.5)
WBC: 5.4 10*3/uL (ref 4.0–10.5)
nRBC: 0 % (ref 0.0–0.2)

## 2019-04-03 LAB — COMPREHENSIVE METABOLIC PANEL
ALT: 19 U/L (ref 0–44)
AST: 18 U/L (ref 15–41)
Albumin: 4.2 g/dL (ref 3.5–5.0)
Alkaline Phosphatase: 89 U/L (ref 38–126)
Anion gap: 11 (ref 5–15)
BUN: 17 mg/dL (ref 6–20)
CO2: 25 mmol/L (ref 22–32)
Calcium: 9.7 mg/dL (ref 8.9–10.3)
Chloride: 106 mmol/L (ref 98–111)
Creatinine, Ser: 0.92 mg/dL (ref 0.44–1.00)
GFR calc Af Amer: >60 mL/min (ref >60)
GFR calc non Af Amer: >60 mL/min (ref >60)
Glucose, Bld: 96 mg/dL (ref 70–99)
Potassium: 3.8 mmol/L (ref 3.5–5.1)
Sodium: 142 mmol/L (ref 135–145)
Total Bilirubin: 0.6 mg/dL (ref 0.3–1.2)
Total Protein: 7.7 g/dL (ref 6.5–8.1)

## 2019-04-09 ENCOUNTER — Encounter: Payer: Self-pay | Admitting: Internal Medicine

## 2019-04-09 MED ORDER — RIVAROXABAN 20 MG PO TABS: 20.0000 mg | ORAL_TABLET | Freq: Every day | ORAL | 1 refills | Status: DC

## 2019-04-10 ENCOUNTER — Encounter: Payer: Self-pay | Admitting: Internal Medicine

## 2019-04-10 DIAGNOSIS — M79671 Pain in right foot: Secondary | ICD-10-CM

## 2019-04-10 DIAGNOSIS — R002 Palpitations: Secondary | ICD-10-CM

## 2019-04-10 DIAGNOSIS — M25572 Pain in left ankle and joints of left foot: Secondary | ICD-10-CM

## 2019-04-13 ENCOUNTER — Encounter: Payer: Self-pay | Admitting: Internal Medicine

## 2019-04-14 ENCOUNTER — Other Ambulatory Visit: Payer: Self-pay

## 2019-04-14 MED ORDER — METOPROLOL TARTRATE 50 MG PO TABS: 50.0000 mg | ORAL_TABLET | Freq: Two times a day (BID) | ORAL | 1 refills | Status: DC

## 2019-04-15 ENCOUNTER — Telehealth: Payer: Self-pay | Admitting: *Deleted

## 2019-04-15 MED ORDER — METOPROLOL TARTRATE 50 MG PO TABS: 50.0000 mg | ORAL_TABLET | Freq: Two times a day (BID) | ORAL | 1 refills | Status: DC

## 2019-04-15 NOTE — Telephone Encounter (Signed)
Pt sent mychart msg stating the metoprolol was sent to wrong pharmacy. Resent rx to [facility].Marland KitchenJohny [name]

## 2019-04-16 ENCOUNTER — Encounter (INDEPENDENT_AMBULATORY_CARE_PROVIDER_SITE_OTHER): Payer: BC Managed Care – PPO | Admitting: Cardiology

## 2019-04-16 DIAGNOSIS — G4733 Obstructive sleep apnea (adult) (pediatric): Secondary | ICD-10-CM

## 2019-04-17 ENCOUNTER — Telehealth: Payer: Self-pay | Admitting: Radiology

## 2019-04-17 NOTE — Telephone Encounter (Signed)
Enrolled patient for a 14 day Zio monitor to be mailed. Brief instructions were gone over with the patient and she knows to expect the monitor to arrive in 3-4 days.  

## 2019-04-22 ENCOUNTER — Ambulatory Visit: Payer: BC Managed Care – PPO

## 2019-04-22 NOTE — Procedures (Signed)
   Patient Information Name: [name] [name]  ID: [id number] Birth Date: 1961-05-02  Age: 58  Gender: Female Study Date: 04/16/2019 Referring Physician:  Fransico Him, MD  Summary & Diagnosis  TEST DESCRIPTION: Home sleep apnea testing was completed using the WatchPat, a Type 1 device, utilizing peripheral arterial tonometry (PAT), chest movement, actigraphy, pulse oximetry, pulse rate, body position and snore. AHI was calculated with apnea and hypopnea using valid sleep time as the denominator. RDI includes apneas, hypopneas, and RERAs. The data acquired and the scoring of sleep and all associated events were performed in accordance with the recommended standards and specifications as outlined in the AASM Manual for the Scoring of Sleep and Associated Events 2.2.0 (2015).  DIAGNOSIS: 1. Mild Obstructive Sleep Apnea (G47.33) with AHI 8/hr. 2. Mild oxygen desaturations as low as 84% with time spent < 88% at 1.25min. 3. Total sleep time was 6hrs 58min. 4. Mild to moderate snoring. 5. Normal sleep latency at 24 min. 6. Shortened REM onset latency at 67 min.  Recommendations 1. Findings are consistent with mild OSA. For symptomatic mild obstructive sleep apnea, patient preference and compliance impacts efficacious outcomes. Therapeutic options include:  a. The patient may benefit from the use of a nocturnal mandibular repositioning appliance. If that line of therapy is to be pursued the patient should be evaluated by a dentist trained in the treatment of sleep related breathing disorders.  b. An ENT consultation which may be useful for specific causes of obstruction and possible treatment options .  c. Consider treatment with nasal continuous positive airway pressure (CPAP). If the patient chooses CPAP therapy, a nocturnal PSG with CPAP titration is recommended. As an alternative, an Auto PAP with pressure range 5-20cm H2O with download is an option.  2. Weight loss may be of benefit  in reducing the severity of respiratory events and snoring .  3. Routine follow-up efficacy testing should be performed.  Report prepared by:  Fransico Him, MD  Signature: Electronically Signed

## 2019-04-26 ENCOUNTER — Other Ambulatory Visit (INDEPENDENT_AMBULATORY_CARE_PROVIDER_SITE_OTHER): Payer: BC Managed Care – PPO

## 2019-04-26 DIAGNOSIS — R002 Palpitations: Secondary | ICD-10-CM | POA: Diagnosis not present

## 2019-04-28 ENCOUNTER — Encounter: Payer: Self-pay | Admitting: Internal Medicine

## 2019-04-28 ENCOUNTER — Encounter: Payer: Self-pay | Admitting: Neurology

## 2019-04-28 ENCOUNTER — Telehealth: Payer: Self-pay | Admitting: *Deleted

## 2019-04-28 NOTE — Telephone Encounter (Signed)
-----   Message from Sueanne Margarita, MD sent at 04/22/2019  8:30 PM EDT ----- Please let patient know that they have sleep apnea and recommend ResMed CPAP on auto from 6 to 20cm H2O with heated humidity and mask of choice through [facility].  Will need 10 week followup with me once she gets her device

## 2019-04-28 NOTE — Telephone Encounter (Signed)
Itamar test ordered and faxed today.

## 2019-04-28 NOTE — Telephone Encounter (Addendum)
Patient Name: [name] [name]        DOB: 02/22/1961      Height: 5'5"    Weight:215lb  Office Name: [facility]         Referring Provider:Traci Turner  Today's Date:04/28/19  Date:   STOP BANG RISK ASSESSMENT S (snore) Have you been told that you snore?     NO   T (tired) Are you often tired, fatigued, or sleepy during the day?   NO  O (obstruction) Do you stop breathing, choke, or gasp during sleep? NO   P (pressure) Do you have or are you being treated for high blood pressure? YES   B (BMI) Is your body index greater than 35 kg/m? YES   A (age) Are you 89 years old or older? YES   N (neck) Do you have a neck circumference greater than 16 inches?   NO   G (gender) Are you a female?  NO   TOTAL STOP/BANG "YES" ANSWERS                                                                        For Office Use Only              Procedure Order Form    YES to 3+ Stop Bang questions OR two clinical symptoms - patient qualifies for WatchPAT (CPT 95800)     Submit: This Form + Patient Face Sheet + Clinical Note via CloudPAT or Fax: [phone]         Clinical Notes: Will consult Sleep Specialist and refer for management of therapy due to patient increased risk of Sleep Apnea. Ordering a sleep study due to the following two clinical symptoms: Excessive daytime sleepiness G47.10 / Gastroesophageal reflux K21.9 / Nocturia R35.1 / Morning Headaches G44.221 / Difficulty concentrating R41.840 / Memory problems or poor judgment G31.84 / Personality changes or irritability R45.4 / Loud snoring R06.83 / Depression F32.9 / Unrefreshed by sleep G47.8 / Impotence N52.9 / History of high blood pressure R03.0 / Insomnia G47.00    I understand that I am proceeding with a home sleep apnea test as ordered by my treating physician. I understand that untreated sleep apnea is a serious cardiovascular risk factor and it is my responsibility to perform the test and seek management for sleep apnea. I will  be contacted with the results and be managed for sleep apnea by a local sleep physician. I will be receiving equipment and further instructions from [facility]. I shall promptly ship back the equipment via the included mailing label. I understand my insurance will be billed for the test and as the patient I am responsible for any insurance related out-of-pocket costs incurred. I have been provided with written instructions and can call for additional video or telephonic instruction, with 24-hour availability of qualified personnel to answer any questions: Patient Help Desk [phone].  Patient Signature _________________Katherine McKinney____________________________________   Date____10/8/20__________________ Patient Telemedicine Verbal Consent

## 2019-04-28 NOTE — Telephone Encounter (Addendum)
Informed patient of sleep study results and patient understanding was verbalized. Patient understands her sleep study showed they have sleep apnea and recommend ResMed CPAP on auto from 6 to 20cm H2O with heated humidity and mask of choice through [facility]. Will need 10 week followup with me once she gets her device. Upon patient request DME selection is [facility] Patient understands SHE will be contacted by [place] to set up HER cpap. Patient understands to call if [facility] does not contact HER with new setup in a timely manner. Patient understands they will be called once confirmation has been received from [facility] that they have received their new machine to schedule 10 week follow up appointment.  [facility] notified of new cpap order  Please add to airview Patient was grateful for the call and thanked me.

## 2019-04-30 ENCOUNTER — Telehealth: Payer: Self-pay | Admitting: *Deleted

## 2019-04-30 NOTE — Telephone Encounter (Addendum)
Reached out to the patient and found out she wanted to know all her numbers and I went over those with her to her satisfaction. She was informed she had:  1.Mild Obstructive Sleep Apnea (G47.33) with AHI 8/hr.  2. Mild oxygen desaturations as low as 84% with time spent < 88% at 1.16min.  3. Total sleep time was 6hrs 52min.  4. Mild to moderate snoring.  Pt is aware and agreeable to her results.  Thanks, Gae Bon

## 2019-05-07 ENCOUNTER — Telehealth: Payer: Self-pay | Admitting: Cardiology

## 2019-05-07 NOTE — Telephone Encounter (Signed)
Patient is calling for test results. °

## 2019-05-07 NOTE — Telephone Encounter (Signed)
From Barry Brunner: Staff message sent to Gae Bon per phone call today with Abram Sander. @ [employer] no PA is required for sleep study. Ok  to schedule

## 2019-05-07 NOTE — Telephone Encounter (Signed)
Pt stated she received a bill for sleep study in the amount of $760.00 due to no prior auth done Will forward to [place] and prior auth pool for review and to further investigate . /cy

## 2019-05-08 NOTE — Telephone Encounter (Signed)
Patient had a study with [facility] who precerts their own sleep studies. I gave patient the [phone] number to reach [facility].

## 2019-05-13 ENCOUNTER — Encounter: Payer: Self-pay | Admitting: Internal Medicine

## 2019-05-13 ENCOUNTER — Ambulatory Visit (INDEPENDENT_AMBULATORY_CARE_PROVIDER_SITE_OTHER): Payer: BC Managed Care – PPO | Admitting: Internal Medicine

## 2019-05-13 DIAGNOSIS — J0120 Acute ethmoidal sinusitis, unspecified: Secondary | ICD-10-CM | POA: Diagnosis not present

## 2019-05-13 MED ORDER — AZITHROMYCIN 250 MG PO TABS: ORAL_TABLET | ORAL | 0 refills | Status: DC

## 2019-05-13 NOTE — Progress Notes (Signed)
Virtual Visit via Video Note  I connected with [name] [name] on 05/13/19 at  3:15 PM EDT by a video enabled telemedicine application and verified that I am speaking with the correct person using two identifiers.   I discussed the limitations of evaluation and management by telemedicine and the availability of in person appointments. The patient expressed understanding and agreed to proceed.  The patient is currently at home and I am in the office.    No referring provider.    History of Present Illness: This is an acute visit for ? Sinus infection.   Her symptoms started a few days ago.  She is experiencing nasal congestion with a slight discoloration, right-sided ear pain, sinus pressure, postnasal drip, sneezing, some wheezing and headaches.  She denies any fevers, sore throat, cough or shortness of breath.  She has tried taking her Symbicort, tylenol and Claritin and flonase.  She has a history of sinus infections.   Review of Systems  Constitutional: Negative for chills and fever.  HENT: Positive for congestion (discolored mucus), ear pain (right) and sinus pain. Negative for sore throat.        PND, sneezing  Respiratory: Positive for wheezing. Negative for cough and shortness of breath.   Gastrointestinal: Negative for diarrhea and nausea.  Musculoskeletal: Negative for myalgias.  Neurological: Positive for headaches. Negative for dizziness.      Social History   Socioeconomic History  . Marital status: Single    Spouse name: Not on file  . Number of children: 2  . Years of education: 30  . Highest education level: Not on file  Occupational History  . Occupation: Solicitor: [employer]  Social Needs  . Financial resource strain: Not on file  . Food insecurity    Worry: Not on file    Inability: Not on file  . Transportation needs    Medical: Not on file    Non-medical: Not on file  Tobacco Use  . Smoking status: Never Smoker   . Smokeless tobacco: Never Used  Substance and Sexual Activity  . Alcohol use: No    Alcohol/week: 0.0 standard drinks  . Drug use: No  . Sexual activity: Not on file  Lifestyle  . Physical activity    Days per week: Not on file    Minutes per session: Not on file  . Stress: Not on file  Relationships  . Social Herbalist on phone: Not on file    Gets together: Not on file    Attends religious service: Not on file    Active member of club or organization: Not on file    Attends meetings of clubs or organizations: Not on file    Relationship status: Not on file  Other Topics Concern  . 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    Patient is single and lives alone.    Patient is right handed.      Observations/Objective: Appears well in NAD Breathing normally.  Assessment and Plan:  See Problem List for Assessment and Plan of chronic medical problems.   Follow Up Instructions:    I discussed the assessment and treatment plan with the patient. The patient was provided an opportunity to ask questions and all were answered. The patient agreed with the plan and demonstrated an understanding of the instructions.   The patient  was advised to call back or seek an in-person evaluation if the symptoms worsen or if the condition fails to improve as anticipated.    Binnie Rail, MD

## 2019-05-13 NOTE — Assessment & Plan Note (Addendum)
Concern for bacterial infection, but it has only been a few days I will send in a Z-Pak, but advised for her to give it a few more days and continue her allergy medications, inhaler If symptoms worsen or do not improve she should start the Z-Pak otc allergy and cold medications Rest, fluid Call if no improvement

## 2019-05-15 ENCOUNTER — Ambulatory Visit: Payer: BC Managed Care – PPO | Admitting: Internal Medicine

## 2019-05-15 ENCOUNTER — Other Ambulatory Visit: Payer: Self-pay

## 2019-05-15 NOTE — Telephone Encounter (Signed)
The patient has been notified of the result and verbalized understanding.  All questions (if any) were answered.  Pt reports that she does still have palpitations at random.  She reports experiencing these palpitations or quite some time and this is why her heart monitor was originally ordered.     Wilma Flavin, RN 05/15/2019 8:20 AM

## 2019-05-26 ENCOUNTER — Encounter: Payer: Self-pay | Admitting: Internal Medicine

## 2019-06-13 NOTE — Telephone Encounter (Signed)
Patient has a 10 week follow up appointment scheduled for 07/11/19 8:20. Patient understands she needs to keep this appointment for insurance compliance.

## 2019-06-18 ENCOUNTER — Other Ambulatory Visit: Payer: Self-pay

## 2019-06-18 MED ORDER — LINACLOTIDE 145 MCG PO CAPS: 290.0000 ug | ORAL_CAPSULE | Freq: Every day | ORAL | 1 refills | Status: DC

## 2019-06-23 ENCOUNTER — Encounter: Payer: Self-pay | Admitting: Internal Medicine

## 2019-06-24 NOTE — Patient Instructions (Addendum)
  Flu immunization administered today.    Medications reviewed and updated.  Changes include :   Change gabapentin to 300 mg in the morning and 600 mg at night.   Taper off the pantoprazole.       Please followup in 6 months

## 2019-06-24 NOTE — Progress Notes (Signed)
Subjective:    Patient ID: [name] [name], female    DOB: 09-06-1961, 58 y.o.   MRN: [id number]  HPI The patient is here for follow up.  She is currently working from home.    She is exercising regularly - walking .  She has lost 4 pounds since I saw her last.  She has been having palpitations.  She has had chronic palpitations and these are her chronic palpitations not new palpitations.  These are not related to her atrial flutter.  She states she feels it a couple of times a day and they are transient.  She was wondering what could be done about them.  Brown spots on right arm and one on left arm: She noticed that these are coming up and- coming up for no reason. No pain.  Not bruises.    Hypertension: She is taking her medication daily. She is compliant with a low sodium diet.  She denies chest pain, edema, shortness of breath and regular headaches. She does not monitor her blood pressure at home.   Aflutter:  She had new onset aflutter in June 2020.  She is taking her medication daily as prescribed.  She is following with cardiology.   GERD:  She is taking her medication 3 times a week.  She denies any GERD symptoms and feels her GERD is well controlled.  She wonders if she still needs the medication.  Sleep difficulties, mild sleep apnea:  She takes trazodone nightly.  The medication does not work well.  She is using her CPAP nightly, but was told that she does not always get into good sleep.  It was suggested that she do cognitive therapy.  She feels tired sometimes.  Chronic constipation:  She takes linzess daily.  It helps a little.  She is exercising and drinks plenty of water.  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  The medication does help.   Medications and allergies reviewed with patient and updated if appropriate.  Patient Active Problem List   Diagnosis Date Noted  . New onset atrial flutter ([place]) 02/28/2019  . Psychophysiological insomnia  11/21/2018  . RLS (restless legs syndrome) 11/21/2018  . Sleep difficulties 08/06/2018  . Trigger finger 01/23/2018  . Meralgia paraesthetica, right 11/05/2017  . Iron deficiency 10/09/2017  . Vitamin D deficiency 06/15/2017  . Lumbar radiculopathy 04/21/2017  . Trigger index finger of left hand 04/21/2017  . Obesity (BMI 35.0-39.9 without comorbidity) 04/21/2017  . Left hip pain 10/31/2016  . Pes planus 10/31/2016  . Posterior tibialis muscle dysfunction 10/31/2016  . Memory difficulties 09/03/2016  . Breast pain, right 08/24/2016  . Palpitation 04/04/2016  . 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 Malignant neoplasm of upper-outer quadrant of right breast in female, 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[place]) 11/03/2015  . S/P mastectomy 05/13/2015  . Chemotherapy-induced neuropathy ([place]) 04/21/2015  . Genetic testing 12/11/2014  . Breast cancer of upper-outer quadrant of right female breast ([place]) 10/23/2014  . Anxiety 04/29/2012  . Allergic rhinitis 11/29/2009  . 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 CONSTIPATION, CHRONIC 08/17/2008  . Dyslipidemia 04/24/2008  . METABOLIC SYNDROME X 99991111  . GERD 01/07/2008  . Essential hypertension 09/20/2007  . Multiple sclerosis ([place]) 09/18/2007    Current Outpatient Medications on File Prior to Visit  Medication Sig Dispense Refill  . b complex vitamins tablet Take 1 tablet by mouth daily.    . ferrous gluconate (FERGON) 324 MG tablet Take 1 tablet (324 mg total) by mouth 2 (two) times  daily with a meal. (Patient taking differently: Take 324 mg by mouth 3 (three) times a week. ) 180 tablet 3  . fluticasone (FLONASE) 50 MCG/ACT nasal spray Place 2 sprays into both nostrils daily. 16 g 6  . gabapentin (NEURONTIN) 100 MG capsule Take 3 capsules (300 mg total) by mouth 3 (three) times daily. 810 capsule 1  . glatiramer (COPAXONE) 20 MG/ML SOSY injection Copaxone 20 mg/mL subcutaneous syringe    . hydrochlorothiazide (HYDRODIURIL) 25 MG tablet Take 1 tablet (25 mg total) by  mouth daily. 90 tablet 1  . linaclotide (LINZESS) 145 MCG CAPS capsule Take 2 capsules (290 mcg total) by mouth daily. 180 capsule 1  . loratadine-pseudoephedrine (CLARITIN-D 12 HOUR) 5-120 MG tablet Take 1 tablet by mouth daily as needed (seasonal allergies). 90 tablet 1  . losartan (COZAAR) 50 MG tablet Take 1 tablet (50 mg total) by mouth daily. 90 tablet 1  . metoprolol tartrate (LOPRESSOR) 50 MG tablet Take 1 tablet (50 mg total) by mouth 2 (two) times daily. 180 tablet 1  . pantoprazole (PROTONIX) 40 MG tablet TAKE 1 TABLET BY MOUTH EVERY DAY 90 tablet 1  . potassium chloride (MICRO-K) 10 MEQ CR capsule TAKE 2 CAPSULES(20 MEQ) BY MOUTH TWICE DAILY 360 capsule 1  . Probiotic Product (PROBIOTIC PO) Take 2 capsules by mouth daily.    . rivaroxaban (XARELTO) 20 MG TABS tablet Take 1 tablet (20 mg total) by mouth daily with supper. 90 tablet 1  . SYMBICORT 160-4.5 MCG/ACT inhaler INHALE 2 PUFFS INTO THE LUNGS TWICE DAILY 10.2 g 0  . traZODone (DESYREL) 50 MG tablet Take 1-2 tablets (50-100 mg total) by mouth at bedtime as needed for sleep. 60 tablet 3  . TURMERIC PO Take by mouth daily.    [name] VITAMIN D PO Take 2,000 Units by mouth daily.     No current facility-administered medications on file prior to visit.     Past Medical History:  Diagnosis Date  . Acute non-recurrent maxillary sinusitis 01/08/2017  . Allergic rhinitis due to other allergen   . Allergy   . Angioedema    ACEI  . 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 Anxiety   . Breast cancer of upper-outer quadrant of right female breast ([place]) 10/23/2014   s/p r mastectomy, neoadj chemo completed 04/21/15; neg genetic testing  . 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  . CHEST PAIN-UNSPECIFIED 05/19/2010   Qualifier: Diagnosis of  By: Aundra Dubin, MD, [place]    . Chronic fatigue 06/08/2016  . Colon polyps 05/2014   adenomatous, colo q 5y  . Conjunctiva disorder 08/06/2018  . CONSTIPATION, CHRONIC   . Cough 04/25/2016   . Depression 09/03/2016  . Ear canal dryness 01/23/2018  . FATIGUE 06/24/2008   Qualifier: Diagnosis of  By: Wynona Luna   . GERD   . Herpes zoster without complication 99991111  . Hot flashes   . HYPERLIPIDEMIA   . HYPERTENSION   . Hypokalemia 12/10/2014  . Lactose intolerance   . LRTI (lower respiratory tract infection) 09/05/2017  . LUQ cramping 04/21/2017  . Migraine headache   . MIGRAINES, HX OF 09/18/2007   Qualifier: Diagnosis of  By: Danelle Earthly CMA, Darlene    . MS (multiple sclerosis) ([place]) 11/03/2015  . Multiple sclerosis ([place])   . Muscle strain 10/21/2016  . OBESITY, TRUNCAL   . OTHER SYMPTOMS INVOLVING DIGESTIVE SYSTEM OTHER 12/14/2009   Qualifier: Diagnosis of  By: Asa Lente MD, Jannifer Rodney   . Otitis, externa, infective 05/24/2016  .  PERSONAL HX COLONIC POLYPS 04/21/2009   Qualifier: Diagnosis of  By: Trellis Paganini PA-c, Amy S   . SINUSITIS 01/14/2010   Qualifier: Diagnosis of  By: Asa Lente MD, Jannifer Rodney Upper respiratory tract infection 09/23/2016  . UTI 08/19/2010   Qualifier: Diagnosis of  By: Marca Ancona RMA, [place]    . Wheezing 09/10/2017    Past Surgical History:  Procedure Laterality Date  . BREAST BIOPSY Right    [place]   . COLONOSCOPY    . MASTECTOMY Right   . PORT-A-CATH REMOVAL Left 05/13/2015   Procedure: REMOVAL PORT-A-CATH;  Surgeon: [name], MD;  Location: [facility];  Service: General;  Laterality: Left;  . PORTACATH PLACEMENT Left 11/06/2014   Procedure: INSERTION PORT-A-CATH;  Surgeon: [name], MD;  Location: [facility];  Service: General;  Laterality: Left;  . SIMPLE MASTECTOMY WITH AXILLARY SENTINEL NODE BIOPSY Right 05/13/2015   Procedure: RIGHT TOTAL  MASTECTOMY WITH RIGHT  AXILLARY SENTINEL NODE BIOPSY;  Surgeon: [name], MD;  Location: [facility];  Service: General;  Laterality: Right;  . TUBAL LIGATION      Social History   Socioeconomic History  . Marital status: Single    Spouse name: Not on file  . Number of children:  2  . Years of education: 58  . Highest education level: Not on file  Occupational History  . 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 Financial resource strain: Not on file  . Food insecurity    Worry: Not on file    Inability: Not on file  . Transportation needs    Medical: Not on file    Non-medical: Not on file  Tobacco Use  . Smoking status: Never Smoker  . Smokeless tobacco: Never Used  Substance and Sexual Activity  . Alcohol use: No    Alcohol/week: 0.0 standard drinks  . Drug use: No  . Sexual activity: Not on file  Lifestyle  . Physical activity    Days per week: Not on file    Minutes per session: Not on file  . Stress: Not on file  Relationships  . Social Herbalist on phone: Not on file    Gets together: Not on file    Attends religious service: Not on file    Active member of club or organization: Not on file    Attends meetings of clubs or organizations: Not on file    Relationship status: Not on file  Other Topics Concern  . 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    Patient is single and lives alone.    Patient is right handed.     Family History  Problem Relation Age of Onset  . Coronary artery disease Mother   . Heart disease Mother   . Diabetes Mother   . Hypertension Mother   . Stroke Mother   . Alcohol abuse Father   . COPD Maternal Grandmother   . Hypertension Other   . Hyperlipidemia Other   . Diabetes Other   . Colon cancer Neg Hx     Review of Systems  Constitutional: Positive for fatigue (sometimes). Negative for chills and fever.  Respiratory: Negative for cough, shortness of breath and wheezing.   Cardiovascular: Positive for palpitations (chronic - no change - not related to aflutter). Negative for leg swelling.  Neurological: Negative for light-headedness and headaches.   Psychiatric/Behavioral: Negative for dysphoric mood. The patient is not nervous/anxious.        Objective:   Vitals:   06/25/19 1420  BP: 124/72  Pulse: 84  Resp: 16  Temp: 98.4 F (36.9 C)  SpO2: 98%   BP Readings from Last 3 Encounters:  06/25/19 124/72  04/03/19 123/74  02/28/19 (!) 146/88   Wt Readings from Last 3 Encounters:  06/25/19 213 lb (96.6 kg)  04/03/19 215 lb 14.4 oz (97.9 kg)  03/24/19 216 lb (98 kg)   Body mass index is 35.45 kg/m.   Physical Exam    Constitutional: Appears well-developed and well-nourished. No distress.  HENT:  Head: Normocephalic and atraumatic.  Neck: Neck supple. No tracheal deviation present. No thyromegaly present.  No cervical lymphadenopathy Cardiovascular: Normal rate, regular rhythm and normal heart sounds.   No murmur heard. No carotid bruit .  No edema Pulmonary/Chest: Effort normal and breath sounds normal. No respiratory distress. No has no wheezes. No rales.  Skin: Skin is warm and dry. Not diaphoretic.  Psychiatric: Normal mood and affect. Behavior is normal.      Assessment & Plan:    See Problem List for Assessment and Plan of chronic medical problems.

## 2019-06-25 ENCOUNTER — Encounter: Payer: Self-pay | Admitting: Internal Medicine

## 2019-06-25 ENCOUNTER — Ambulatory Visit (INDEPENDENT_AMBULATORY_CARE_PROVIDER_SITE_OTHER): Payer: BC Managed Care – PPO | Admitting: Internal Medicine

## 2019-06-25 ENCOUNTER — Other Ambulatory Visit: Payer: Self-pay

## 2019-06-25 VITALS — BP 124/72 | HR 84 | Temp 98.4°F | Resp 16 | Ht 65.0 in | Wt 213.0 lb

## 2019-06-25 DIAGNOSIS — K219 Gastro-esophageal reflux disease without esophagitis: Secondary | ICD-10-CM

## 2019-06-25 DIAGNOSIS — G479 Sleep disorder, unspecified: Secondary | ICD-10-CM | POA: Diagnosis not present

## 2019-06-25 DIAGNOSIS — I4892 Unspecified atrial flutter: Secondary | ICD-10-CM

## 2019-06-25 DIAGNOSIS — Z23 Encounter for immunization: Secondary | ICD-10-CM | POA: Diagnosis not present

## 2019-06-25 DIAGNOSIS — G620 Drug-induced polyneuropathy: Secondary | ICD-10-CM

## 2019-06-25 DIAGNOSIS — I10 Essential (primary) hypertension: Secondary | ICD-10-CM

## 2019-06-25 DIAGNOSIS — T451X5A Adverse effect of antineoplastic and immunosuppressive drugs, initial encounter: Secondary | ICD-10-CM

## 2019-06-25 DIAGNOSIS — R002 Palpitations: Secondary | ICD-10-CM

## 2019-06-25 DIAGNOSIS — K5909 Other constipation: Secondary | ICD-10-CM | POA: Diagnosis not present

## 2019-06-25 NOTE — Assessment & Plan Note (Signed)
Well-controlled Continue current medication at current doses

## 2019-06-25 NOTE — Assessment & Plan Note (Signed)
Chronic palpitations-occur a couple of times a day and are transient No change recently This is different from her atrial flutter Advised treatment is not needed She will discuss with cardiology

## 2019-06-25 NOTE — Assessment & Plan Note (Signed)
Taking gabapentin 300 mg 3 times a day-since she is not sleeping well will try to change gabapentin to 300 mg in the morning and 600 mg at night to see if that helps-can adjust further if needed

## 2019-06-25 NOTE — Assessment & Plan Note (Signed)
Taking pantoprazole TIW - will try tapering off the medication completely

## 2019-06-25 NOTE — Assessment & Plan Note (Signed)
Likely multifactorial Taking trazodone - which may help a little Cognitive therapy was recommended to her and she will do that Will try gabapentin 600 mg at night and stop afternoon dose to see if that helps

## 2019-06-25 NOTE — Assessment & Plan Note (Signed)
Taking Linzess-this does help-continue Continue regular exercise and increase fluid intake

## 2019-06-25 NOTE — Assessment & Plan Note (Signed)
Diagnosed 02/2019 Following with cardiology Taking metoprolol, Xarelto-continue current doses  in sinus rhythm here today Further management per cardiology

## 2019-06-28 ENCOUNTER — Ambulatory Visit: Payer: BC Managed Care – PPO

## 2019-07-10 NOTE — Progress Notes (Signed)
Virtual Visit via Video Note   This visit type was conducted due to national recommendations for restrictions regarding the COVID-19 Pandemic (e.g. social distancing) in an effort to limit this patient's exposure and mitigate transmission in our community.  Due to her co-morbid illnesses, this patient is at least at moderate risk for complications without adequate follow up.  This format is felt to be most appropriate for this patient at this time.  All issues noted in this document were discussed and addressed.  A limited physical exam was performed with this format.  Please refer to the patient's chart for her consent to telehealth for [facility].  Evaluation Performed:  Follow-up visit  This visit type was conducted due to national recommendations for restrictions regarding the COVID-19 Pandemic (e.g. social distancing).  This format is felt to be most appropriate for this patient at this time.  All issues noted in this document were discussed and addressed.  No physical exam was performed (except for noted visual exam findings with Video Visits).  Please refer to the patient's chart (MyChart message for video visits and phone note for telephone visits) for the patient's consent to telehealth for [facility].  Date:  07/11/2019   ID:  [name] [name], DOB [birth year], MRN [id number]  Patient Location:  Home  Provider location:   [place]  PCP:  Binnie Rail, MD  Cardiologist:  Fransico Him, MD Electrophysiologist:  None   Chief Complaint:  Paroxysmal atrial flutter  History of Present Illness:    [name] [name] is a 58 y.o. female who presents via audio/video conferencing for a telehealth visit today.    This is a 58yo female with a history MS, insomnia, RLS, obesity and HTN and PAFlutter.  She is on Xarelto for a CHADS2VASC score of 2.  A sleep study was ordered and she underwent PSG showing mild OSA with an AHI of 8 and is now on CPAP.    She is here today for  followup and is doing well.  She denies any chest pain or pressure, SOB, DOE, PND, orthopnea, LE edema, dizziness or syncope. She is still having problems with palpitations at times but only last a few minutes.  She has not had any recently.  She is compliant with her meds and is tolerating meds with no SE.    She is doing well with her CPAP device and thinks that she has gotten used to it.  She tolerates the mask and feels the pressure is adequate.  Since going on CPAP she feels rested in the am and has no significant daytime sleepiness.  She denies any significant mouth or nasal dryness or nasal congestion.  She does not think that he snores.    The patient does not have symptoms concerning for COVID-19 infection (fever, chills, cough, or new shortness of breath).   Prior CV studies:   The following studies were reviewed today:  none  Past Medical History:  Diagnosis Date  . Acute non-recurrent maxillary sinusitis 01/08/2017  . Allergic rhinitis due to other allergen   . Allergy   . Angioedema    ACEI  . 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 Anxiety   . Breast cancer of upper-outer quadrant of right female breast ([place]) 10/23/2014   s/p r mastectomy, neoadj chemo completed 04/21/15; neg genetic testing  . 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  . CHEST PAIN-UNSPECIFIED 05/19/2010   Qualifier: Diagnosis of  By: Aundra Dubin, MD, [place]    . Chronic fatigue 06/08/2016  .  Colon polyps 05/2014   adenomatous, colo q 5y  . Conjunctiva disorder 08/06/2018  . CONSTIPATION, CHRONIC   . Cough 04/25/2016  . Depression 09/03/2016  . Ear canal dryness 01/23/2018  . FATIGUE 06/24/2008   Qualifier: Diagnosis of  By: Wynona Luna   . GERD   . Herpes zoster without complication 99991111  . Hot flashes   . HYPERLIPIDEMIA   . HYPERTENSION   . Hypokalemia 12/10/2014  . Lactose intolerance   . LRTI (lower respiratory tract infection) 09/05/2017  . LUQ cramping 04/21/2017  . Migraine  headache   . MIGRAINES, HX OF 09/18/2007   Qualifier: Diagnosis of  By: Danelle Earthly CMA, Darlene    . MS (multiple sclerosis) ([place]) 11/03/2015  . Multiple sclerosis ([place])   . Muscle strain 10/21/2016  . OBESITY, TRUNCAL   . OTHER SYMPTOMS INVOLVING DIGESTIVE SYSTEM OTHER 12/14/2009   Qualifier: Diagnosis of  By: Asa Lente MD, Jannifer Rodney   . Otitis, externa, infective 05/24/2016  . PERSONAL HX COLONIC POLYPS 04/21/2009   Qualifier: Diagnosis of  By: Trellis Paganini PA-c, Amy S   . SINUSITIS 01/14/2010   Qualifier: Diagnosis of  By: Asa Lente MD, Jannifer Rodney Upper respiratory tract infection 09/23/2016  . UTI 08/19/2010   Qualifier: Diagnosis of  By: Marca Ancona RMA, Lucy    . Wheezing 09/10/2017   Past Surgical History:  Procedure Laterality Date  . BREAST BIOPSY Right    [place]   . COLONOSCOPY    . MASTECTOMY Right   . PORT-A-CATH REMOVAL Left 05/13/2015   Procedure: REMOVAL PORT-A-CATH;  Surgeon: [name], MD;  Location: [place];  Service: General;  Laterality: Left;  . PORTACATH PLACEMENT Left 11/06/2014   Procedure: INSERTION PORT-A-CATH;  Surgeon: [name], MD;  Location: [place];  Service: General;  Laterality: Left;  . SIMPLE MASTECTOMY WITH AXILLARY SENTINEL NODE BIOPSY Right 05/13/2015   Procedure: RIGHT TOTAL  MASTECTOMY WITH RIGHT  AXILLARY SENTINEL NODE BIOPSY;  Surgeon: [name], MD;  Location: [place];  Service: General;  Laterality: Right;  . TUBAL LIGATION       Current Meds  Medication Sig  . b complex vitamins tablet Take 1 tablet by mouth daily.  . ferrous gluconate (FERGON) 324 MG tablet Take 1 tablet (324 mg total) by mouth 2 (two) times daily with a meal. (Patient taking differently: Take 324 mg by mouth 3 (three) times a week. )  . fluticasone (FLONASE) 50 MCG/ACT nasal spray Place 2 sprays into both nostrils daily.  [name] gabapentin (NEURONTIN) 100 MG capsule Take 3 capsules (300 mg total) by mouth 3 (three) times daily.  [name] glatiramer (COPAXONE) 20 MG/ML  SOSY injection Copaxone 20 mg/mL subcutaneous syringe  . hydrochlorothiazide (HYDRODIURIL) 25 MG tablet Take 1 tablet (25 mg total) by mouth daily.  [name] linaclotide (LINZESS) 145 MCG CAPS capsule Take 2 capsules (290 mcg total) by mouth daily.  [name] loratadine-pseudoephedrine (CLARITIN-D 12 HOUR) 5-120 MG tablet Take 1 tablet by mouth daily as needed (seasonal allergies).  . losartan (COZAAR) 50 MG tablet Take 1 tablet (50 mg total) by mouth daily.  . metoprolol tartrate (LOPRESSOR) 50 MG tablet Take 1 tablet (50 mg total) by mouth 2 (two) times daily.  . pantoprazole (PROTONIX) 40 MG tablet TAKE 1 TABLET BY MOUTH EVERY DAY (Patient taking differently: 3 times per week)  . potassium chloride (MICRO-K) 10 MEQ CR capsule TAKE 2 CAPSULES(20 MEQ) BY MOUTH TWICE DAILY  . Probiotic Product (PROBIOTIC PO) Take 2  capsules by mouth daily.  . rivaroxaban (XARELTO) 20 MG TABS tablet Take 1 tablet (20 mg total) by mouth daily with supper.  . SYMBICORT 160-4.5 MCG/ACT inhaler INHALE 2 PUFFS INTO THE LUNGS TWICE DAILY (Patient taking differently: Takes as needed)  . traZODone (DESYREL) 50 MG tablet Take 1-2 tablets (50-100 mg total) by mouth at bedtime as needed for sleep.  . TURMERIC PO Take by mouth daily.  [name] VITAMIN D PO Take 2,000 Units by mouth daily.     Allergies:   Ace inhibitors, Clarithromycin, Contrave [naltrexone-bupropion hcl er], and Levaquin [levofloxacin]   Social History   Tobacco Use  . Smoking status: Never Smoker  . Smokeless tobacco: Never Used  Substance Use Topics  . Alcohol use: No    Alcohol/week: 0.0 standard drinks  . Drug use: No     Family Hx: The patient's family history includes Alcohol abuse in her father; COPD in her maternal grandmother; Coronary artery disease in her mother; Diabetes in her mother and another family member; Heart disease in her mother; Hyperlipidemia in an other family member; Hypertension in her mother and another family member; Stroke in her mother.  There is no history of Colon cancer.  ROS:   Please see the history of present illness.     All other systems reviewed and are negative.   Labs/Other Tests and Data Reviewed:    Recent Labs: 10/16/2018: TSH 2.26 04/03/2019: ALT 19; BUN 17; Creatinine, Ser 0.92; Hemoglobin 12.6; Platelets 264; Potassium 3.8; Sodium 142   Recent Lipid Panel Lab Results  Component Value Date/Time   CHOL 202 (H) 10/16/2018 10:12 AM   CHOL 204 (H) 02/21/2017 10:27 AM   TRIG 166.0 (H) 10/16/2018 10:12 AM   HDL 48.00 10/16/2018 10:12 AM   HDL 47 02/21/2017 10:27 AM   CHOLHDL 4 10/16/2018 10:12 AM   LDLCALC 121 (H) 10/16/2018 10:12 AM   LDLCALC 121 (H) 02/21/2017 10:27 AM   LDLDIRECT 119.0 10/09/2017 10:03 AM    Wt Readings from Last 3 Encounters:  06/25/19 213 lb (96.6 kg)  04/03/19 215 lb 14.4 oz (97.9 kg)  03/24/19 216 lb (98 kg)     Objective:    Vital Signs:  LMP 11/01/2012    CONSTITUTIONAL:  Well nourished, well developed female in no acute distress.  EYES: anicteric MOUTH: oral mucosa is pink RESPIRATORY: Normal respiratory effort, symmetric expansion CARDIOVASCULAR: No peripheral edema SKIN: No rash, lesions or ulcers MUSCULOSKELETAL: no digital cyanosis NEURO: Cranial Nerves II-XII grossly intact, moves all extremities PSYCH: Intact judgement and insight.  A&O x 3, Mood/affect appropriate   ASSESSMENT & PLAN:    1.  Paroxysmal atrial flutter -she occasionally will have some brief palpitations but none recently -continue Lopressor 50mg  BID and Xarelto 20mg  daily -creatinine was 0.92 and Hbg 12.6 on 03/2019 -repeat BMET and HBG  2.  HTN -continue Lopressor 50mg  BID, Losartan 50mg  daily and HCTZ 25mg  daily. -she is going to buy a BP machine but it was normal at her PCP 127/87mmHg the other day  3.  Obesity -I have encouraged she to get into a routine exercise program and cut back on carbs and portions.   4.  OSA - The pathophysiology of obstructive sleep apnea , it's  cardiovascular consequences & modes of treatment including CPAP were discused with the patient in detail & they evidenced understanding.  The patient is tolerating PAP therapy well without any problems. The PAP download was reviewed today and showed an AHI of 1.6/hr on  auto PAP with 97% compliance in using more than 4 hours nightly.  The patient has been using and benefiting from PAP use and will continue to benefit from therapy.    COVID-19 Education: The signs and symptoms of COVID-19 were discussed with the patient and how to seek care for testing (follow up with PCP or arrange E-visit).  The importance of social distancing was discussed today.  Patient Risk:   After full review of this patient's clinical status, I feel that they are at least moderate risk at this time.  Time:   Today, I have spent 20 minutes directly with the patient on telemedicine discussing medical problems including PAF, HTN, obesity.  We also reviewed the symptoms of COVID 19 and the ways to protect against contracting the virus with telehealth technology.  I spent an additional 5 minutes reviewing patient's chart including labs.  Medication Adjustments/Labs and Tests Ordered: Current medicines are reviewed at length with the patient today.  Concerns regarding medicines are outlined above.  Tests Ordered: No orders of the defined types were placed in this encounter.  Medication Changes: No orders of the defined types were placed in this encounter.   Disposition:  Follow up in 6 month(s)  With TT  Signed, Fransico Him, MD  07/11/2019 8:35 AM    [facility]

## 2019-07-11 ENCOUNTER — Telehealth (INDEPENDENT_AMBULATORY_CARE_PROVIDER_SITE_OTHER): Payer: BC Managed Care – PPO | Admitting: Cardiology

## 2019-07-11 ENCOUNTER — Other Ambulatory Visit: Payer: BC Managed Care – PPO | Admitting: *Deleted

## 2019-07-11 ENCOUNTER — Telehealth: Payer: Self-pay | Admitting: Cardiology

## 2019-07-11 ENCOUNTER — Other Ambulatory Visit: Payer: Self-pay

## 2019-07-11 DIAGNOSIS — I10 Essential (primary) hypertension: Secondary | ICD-10-CM

## 2019-07-11 DIAGNOSIS — E669 Obesity, unspecified: Secondary | ICD-10-CM | POA: Diagnosis not present

## 2019-07-11 DIAGNOSIS — I4892 Unspecified atrial flutter: Secondary | ICD-10-CM | POA: Diagnosis not present

## 2019-07-11 DIAGNOSIS — G4733 Obstructive sleep apnea (adult) (pediatric): Secondary | ICD-10-CM

## 2019-07-11 LAB — BASIC METABOLIC PANEL
BUN/Creatinine Ratio: 13 (ref 9–23)
BUN: 12 mg/dL (ref 6–24)
CO2: 24 mmol/L (ref 20–29)
Calcium: 9.6 mg/dL (ref 8.7–10.2)
Chloride: 103 mmol/L (ref 96–106)
Creatinine, Ser: 0.89 mg/dL (ref 0.57–1.00)
GFR calc Af Amer: 83 mL/min/{1.73_m2} (ref >59)
GFR calc non Af Amer: 72 mL/min/{1.73_m2} (ref >59)
Glucose: 119 mg/dL — ABNORMAL HIGH (ref 65–99)
Potassium: 3.3 mmol/L — ABNORMAL LOW (ref 3.5–5.2)
Sodium: 140 mmol/L (ref 134–144)

## 2019-07-11 LAB — MAGNESIUM: Magnesium: 1.9 mg/dL (ref 1.6–2.3)

## 2019-07-11 LAB — CBC
Hematocrit: 35.5 % (ref 34.0–46.6)
Hemoglobin: 12.3 g/dL (ref 11.1–15.9)
MCH: 30 pg (ref 26.6–33.0)
MCHC: 34.6 g/dL (ref 31.5–35.7)
MCV: 87 fL (ref 79–97)
Platelets: 266 10*3/uL (ref 150–450)
RBC: 4.1 x10E6/uL (ref 3.77–5.28)
RDW: 14.4 % (ref 11.7–15.4)
WBC: 6.2 10*3/uL (ref 3.4–10.8)

## 2019-07-11 NOTE — Telephone Encounter (Signed)
Melatonin 3mg qhs

## 2019-07-11 NOTE — Telephone Encounter (Signed)
I spoke with pt. She had telemedicine visit this morning and forgot to tell Dr  Radford Pax she has trouble falling asleep.  Denies any palpitations, shortness of breath or other issues when trying to sleep.  She is asking what Dr Radford Pax would recommend.

## 2019-07-11 NOTE — Telephone Encounter (Signed)
Follow Up:     Pt had a Virtual Appt with Dr Radford Pax. She needs the nurse to call her, she have a question to ask please.

## 2019-07-11 NOTE — Patient Instructions (Signed)
Medication Instructions:  Your physician recommends that you continue on your current medications as directed. Please refer to the Current Medication list given to you today.  *If you need a refill on your cardiac medications before your next appointment, please call your pharmacy*  Lab Work: .Lab work to be done today at [facility] at 11:45--BMET, Magnesium, CBC If you have labs (blood work) drawn today and your tests are completely normal, you will receive your results only by: [name] MyChart Message (if you have MyChart) OR . A paper copy in the mail If you have any lab test that is abnormal or we need to change your treatment, we will call you to review the results.  Testing/Procedures: none  Follow-Up: At [facility], you and your health needs are our priority.  As part of our continuing mission to provide you with exceptional heart care, we have created designated Provider Care Teams.  These Care Teams include your primary Cardiologist (physician) and Advanced Practice Providers (APPs -  Physician Assistants and Nurse Practitioners) who all work together to provide you with the care you need, when you need it.  Your next appointment:   6 months  The format for your next appointment:   In Person  Provider:   You may see Dr Radford Pax or one of the following Advanced Practice Providers on your designated Care Team:    Melina Copa, PA-C  Ermalinda Barrios, PA-C   Other Instructions

## 2019-07-11 NOTE — Telephone Encounter (Signed)
I spoke with pt and gave her information from Dr Radford Pax.

## 2019-07-11 NOTE — Addendum Note (Signed)
Addended by: Thompson Grayer on: 07/11/2019 09:45 AM   Modules accepted: Orders

## 2019-07-14 ENCOUNTER — Telehealth: Payer: Self-pay

## 2019-07-14 ENCOUNTER — Other Ambulatory Visit: Payer: Self-pay

## 2019-07-14 DIAGNOSIS — E876 Hypokalemia: Secondary | ICD-10-CM

## 2019-07-14 MED ORDER — POTASSIUM CHLORIDE CRYS ER 20 MEQ PO TBCR: EXTENDED_RELEASE_TABLET | ORAL | 3 refills | Status: DC

## 2019-07-14 NOTE — Telephone Encounter (Signed)
Notes recorded by Frederik Schmidt, RN on 07/14/2019 at 8:37 AM EDT  The patient has been notified of the result and verbalized understanding. All questions (if any) were answered.  Frederik Schmidt, RN 07/14/2019 8:37 AM

## 2019-07-14 NOTE — Telephone Encounter (Signed)
-----   Message from Sueanne Margarita, MD sent at 07/12/2019  2:58 PM EDT ----- Increase Kdur to 60meq qam and 87meq qpm and repeat BMET in 1 week

## 2019-07-21 ENCOUNTER — Other Ambulatory Visit: Payer: Self-pay

## 2019-07-21 ENCOUNTER — Other Ambulatory Visit: Payer: BC Managed Care – PPO | Admitting: *Deleted

## 2019-07-21 DIAGNOSIS — E876 Hypokalemia: Secondary | ICD-10-CM

## 2019-07-21 LAB — BASIC METABOLIC PANEL
BUN/Creatinine Ratio: 15 (ref 9–23)
BUN: 13 mg/dL (ref 6–24)
CO2: 22 mmol/L (ref 20–29)
Calcium: 9.9 mg/dL (ref 8.7–10.2)
Chloride: 102 mmol/L (ref 96–106)
Creatinine, Ser: 0.87 mg/dL (ref 0.57–1.00)
GFR calc Af Amer: 86 mL/min/{1.73_m2} (ref >59)
GFR calc non Af Amer: 74 mL/min/{1.73_m2} (ref >59)
Glucose: 82 mg/dL (ref 65–99)
Potassium: 4 mmol/L (ref 3.5–5.2)
Sodium: 140 mmol/L (ref 134–144)

## 2019-07-24 ENCOUNTER — Encounter: Payer: Self-pay | Admitting: Internal Medicine

## 2019-07-24 ENCOUNTER — Ambulatory Visit: Payer: BC Managed Care – PPO | Admitting: Internal Medicine

## 2019-07-24 ENCOUNTER — Telehealth: Payer: Self-pay

## 2019-07-24 VITALS — Temp 97.4°F | Ht 64.0 in | Wt 215.0 lb

## 2019-07-24 DIAGNOSIS — K5909 Other constipation: Secondary | ICD-10-CM

## 2019-07-24 DIAGNOSIS — Z86010 Personal history of colonic polyps: Secondary | ICD-10-CM

## 2019-07-24 DIAGNOSIS — Z7901 Long term (current) use of anticoagulants: Secondary | ICD-10-CM

## 2019-07-24 DIAGNOSIS — Z1159 Encounter for screening for other viral diseases: Secondary | ICD-10-CM

## 2019-07-24 DIAGNOSIS — K219 Gastro-esophageal reflux disease without esophagitis: Secondary | ICD-10-CM

## 2019-07-24 MED ORDER — NA SULFATE-K SULFATE-MG SULF 17.5-3.13-1.6 GM/177ML PO SOLN: 1.0000 | Freq: Once | ORAL | 0 refills | Status: AC

## 2019-07-24 NOTE — Progress Notes (Signed)
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  The patient's GI history is remarkable for GERD, chronic constipation, and adenomatous colon polyps.  The patient presents today principally regarding the need for surveillance colonoscopy.  She underwent her index examination in July 2007 with an adenomatous colon polyp.  Last examination September 2015 revealed 2 sessile serrated polyps.  Due to history of multiple precancerous polyps follow-up in 5 years recommended.  Patient tells me that she has been using pantoprazole for her reflux disease.  She will get some breakthrough symptoms with dietary indiscretion but for the most part this is helpful.  No dysphagia.  Next, her constipation has been nicely managed with Linzess therapy.  Currently taking 290 mcg daily.  She was recently evaluated by cardiology for her atrial arrhythmia.  Chads vas score 2 was assigned.  Review of outside laboratories from July 21, 2019 finds normal basic metabolic panel with creatinine 0.87.  Review of x-ray file shows abdominal ultrasound from May 2015 was unremarkable.  This was performed to evaluate increasing abdominal girth.  REVIEW OF SYSTEMS:  All non-GI ROS negative unless otherwise stated in the HPI except for sinus and allergy, sleeping problems  Past Medical History:  Diagnosis Date  . Acute non-recurrent maxillary sinusitis 01/08/2017  . Allergic rhinitis due to other allergen   . Allergy   . Angioedema    ACEI  . 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 Anxiety   . Breast cancer of upper-outer quadrant of right female breast ([place]) 10/23/2014   s/p r mastectomy, neoadj chemo completed 04/21/15; neg genetic testing  . Chemotherapy induced  thrombocytopenia 02/25/2015  . CHEST PAIN-UNSPECIFIED 05/19/2010   Qualifier: Diagnosis of  By: Aundra Dubin, MD, [place]    . Chronic fatigue 06/08/2016  . Colon polyps 05/2014   adenomatous, colo q 5y  . Conjunctiva disorder 08/06/2018  . CONSTIPATION, CHRONIC   . Cough 04/25/2016  . Depression 09/03/2016  . Ear canal dryness 01/23/2018  . FATIGUE 06/24/2008   Qualifier: Diagnosis of  By: Wynona Luna   . GERD   . Herpes zoster without complication 99991111  . Hot flashes   . HYPERLIPIDEMIA   . HYPERTENSION   . Hypokalemia 12/10/2014  . Lactose intolerance   . LRTI (lower respiratory tract infection) 09/05/2017  . LUQ cramping 04/21/2017  . Migraine headache   . MIGRAINES, HX OF 09/18/2007   Qualifier: Diagnosis of  By: Danelle Earthly CMA, Darlene    . MS (multiple sclerosis) ([place]) 11/03/2015  . Multiple sclerosis ([place])   . Muscle strain 10/21/2016  . OBESITY, TRUNCAL   . OTHER SYMPTOMS INVOLVING DIGESTIVE SYSTEM OTHER 12/14/2009   Qualifier: Diagnosis of  By: Asa Lente MD, Jannifer Rodney   . Otitis, externa, infective 05/24/2016  . PERSONAL HX COLONIC POLYPS 04/21/2009   Qualifier: Diagnosis of  By: Trellis Paganini PA-c, Amy S   . SINUSITIS 01/14/2010   Qualifier: Diagnosis of  By: Asa Lente MD, Jannifer Rodney Upper respiratory tract infection 09/23/2016  . UTI 08/19/2010   Qualifier: Diagnosis of  By: Marca Ancona RMA, Lucy    . Wheezing 09/10/2017    Past Surgical History:  Procedure Laterality Date  . BREAST BIOPSY Right    [place]   . COLONOSCOPY    . MASTECTOMY Right   .  PORT-A-CATH REMOVAL Left 05/13/2015   Procedure: REMOVAL PORT-A-CATH;  Surgeon: [name], MD;  Location: [place];  Service: General;  Laterality: Left;  . PORTACATH PLACEMENT Left 11/06/2014   Procedure: INSERTION PORT-A-CATH;  Surgeon: [name], MD;  Location: [place];  Service: General;  Laterality: Left;  . SIMPLE MASTECTOMY WITH AXILLARY SENTINEL NODE BIOPSY Right 05/13/2015   Procedure: RIGHT TOTAL  MASTECTOMY  WITH RIGHT  AXILLARY SENTINEL NODE BIOPSY;  Surgeon: [name], MD;  Location: [place];  Service: General;  Laterality: Right;  . TUBAL LIGATION      Social History Jazzlene Edgington  reports that she has never smoked. She has never used smokeless tobacco. She reports that she does not drink alcohol or use drugs.  family history includes Alcohol abuse in her father; COPD in her maternal grandmother; Coronary artery disease in her mother; Diabetes in her mother and another family member; Heart disease in her mother; Hyperlipidemia in an other family member; Hypertension in her mother and another family member; Stroke in her mother.  Allergies  Allergen Reactions  . Ace Inhibitors Anaphylaxis     Angioedema (tongue swelling)  . Clarithromycin Anaphylaxis    Throat swells  . Contrave [Naltrexone-Bupropion Hcl Er]     Chest and sob  . Levaquin [Levofloxacin] Other (See Comments)    Tendon pain - shoulder and calf       PHYSICAL EXAMINATION: Vital signs: Temp (!) 97.4 F (36.3 C)   Ht 5\' 4"  (1.626 m)   Wt 215 lb (97.5 kg)   LMP 11/01/2012   BMI 36.90 kg/m   Constitutional: generally well-appearing, no acute distress Psychiatric: alert and oriented x3, cooperative Eyes: extraocular movements intact, anicteric, conjunctiva pink Mouth: oral pharynx moist, no lesions Neck: supple no lymphadenopathy Cardiovascular: heart regular rate and rhythm, no murmur Lungs: clear to auscultation bilaterally Abdomen: soft, obese, nontender, nondistended, no obvious ascites, no peritoneal signs, normal bowel sounds, no organomegaly Rectal: Deferred until colonoscopy Extremities: no lower extremity edema bilaterally Skin: no clubbing, cyanosis, or lesions on visible extremities Neuro: No focal deficits.  Reduced deep tendon reflexes  ASSESSMENT:  1.  GERD.  For the most part controlled with pantoprazole 40 mg daily 2.  Chronic constipation.  For the most part controlled with Linzess 290  mcg daily 3.  History of adenomatous and sessile serrated polyps (multiple).  Due for surveillance colonoscopy 4.  Multiple medical problems including recent paroxysmal atrial flutter for which she is on Xarelto   PLAN:  1.  Reflux precautions with attention to weight loss 2.  Continue pantoprazole 40 mg daily to be taken 30 to 60 minutes before breakfast 3.  Continue Linzess as needed for chronic constipation 4.  Schedule surveillance colonoscopy.  The patient is HIGH RISK given her comorbidities and the need to address chronic anticoagulation therapy.  Given low CHA2DS2-VASc score and normal renal function I would recommend holding her Xarelto the day prior to the procedure and the day of the procedure.  Would anticipate resumption of medication immediately post procedure.  We will confer with her cardiologist, Dr. Radford Pax, to be sure that this is medically reasonable.The nature of the procedure, as well as the risks, benefits, and alternatives were carefully and thoroughly reviewed with the patient. Ample time for discussion and questions allowed. The patient understood, was satisfied, and agreed to proceed.

## 2019-07-24 NOTE — Telephone Encounter (Signed)
[facility] Pre-operative Risk Assessment     Request for surgical clearance:     Endoscopy Procedure  What type of surgery is being performed?     Colonoscopy  When is this surgery scheduled?     08/27/2019  What type of clearance is required ?   Pharmacy  Are there any medications that need to be held prior to surgery and how long? Xarelto - day before and day of  Practice name and name of physician performing surgery?      [facility]  What is your office phone and fax number?      Phone- [phone]  [phone]  Anesthesia type (None, local, MAC, general) ?       MAC

## 2019-07-24 NOTE — Telephone Encounter (Signed)
Lm on vm that patient that she could hold her Xarelto the day before and day of procedure.  Asked her to call back and leave a message that she had received this message.

## 2019-07-24 NOTE — Patient Instructions (Signed)
You have been scheduled for a colonoscopy. Please follow written instructions given to you at your visit today.  Please pick up your prep supplies at the pharmacy within the next 1-3 days. If you use inhalers (even only as needed), please bring them with you on the day of your procedure.   

## 2019-07-24 NOTE — Telephone Encounter (Signed)
Pt takes Xarelto for aflutter with CHADS2VASc score of 2 (sex, HTN). Renal function is normal. Ok to hold Xarelto starting the day before procedure as requested.

## 2019-07-24 NOTE — Telephone Encounter (Signed)
   Primary Cardiologist: Fransico Him, MD  Chart reviewed as part of pre-operative protocol coverage. Given past medical history and time since last visit, based on ACC/AHA guidelines, [name] [name] would be at acceptable risk for the planned procedure without further cardiovascular testing.   Pt takes Xarelto for aflutter with CHADS2VASc score of 2 (sex, HTN). Renal function is normal. Ok to hold Xarelto starting the day before procedure as requested.  I will route this recommendation to the requesting party via Epic fax function and remove from pre-op pool.  Please call with questions.  Kathyrn Drown, NP 07/24/2019, 12:09 PM

## 2019-07-25 NOTE — Telephone Encounter (Signed)
Noted  

## 2019-07-28 MED ORDER — POTASSIUM CHLORIDE CRYS ER 20 MEQ PO TBCR: EXTENDED_RELEASE_TABLET | ORAL | 3 refills | Status: DC

## 2019-08-07 ENCOUNTER — Telehealth: Payer: Self-pay | Admitting: *Deleted

## 2019-08-07 NOTE — Telephone Encounter (Signed)
Rec'd call from pt requesting samples on her Pillow. Inform pt we had linzess but no xarelto. Left up front for pick-up.Marland KitchenJohny Chess

## 2019-08-08 ENCOUNTER — Telehealth: Payer: Self-pay | Admitting: Cardiology

## 2019-08-08 ENCOUNTER — Telehealth: Payer: Self-pay

## 2019-08-08 NOTE — Telephone Encounter (Signed)
Left message to call office

## 2019-08-08 NOTE — Telephone Encounter (Signed)
I spoke with pt. She reports since potassium was increased she has been having loose stools.  Was on potassium before and tolerated OK but loose stools started after potassium increased. Is on Linzess for constipation but has not been taking since she started having loose stools.  Has greater than 4 loose , watery type stools daily. No other changes recently. Will forward to Dr Radford Pax for review/recommendations.

## 2019-08-08 NOTE — Telephone Encounter (Signed)
Per pt call stated she would like a call back asked for doctor Turner's RN.  Pt did not give a reason for call and hung up.

## 2019-08-08 NOTE — Telephone Encounter (Signed)
Copied from [place] [id number]. Topic: General - Other >> Aug 08, 2019 10:45 AM Rainey Pines A wrote: Patient stated that she would like a callback from [facility] to speak with her about side effects she is experiencing from her medication. Patient did not want to disclose what side effects nor the medication. Please advise  Advised patient that taylor is not working today, but I would help her if she would be ok doing that---she was recently increased dosage with potassium by dr turners office,---increase of 74mcg daily---and has been having diarrhea since that increase----I have advised patient that this can be a side effect of potassium and she should call dr turners office to advise him of the side effect to see if he needs to make medication changes---patient will call dr turners office

## 2019-08-08 NOTE — Telephone Encounter (Unsigned)
Copied from [place] [id number]. Topic: General - Other >> Aug 08, 2019 10:45 AM Rainey Pines A wrote: Patient stated that she would like a callback from [facility] to speak with her about side effects she is experiencing from her medication. Patient did not want to disclose what side effects nor the medication. Please advise

## 2019-08-09 NOTE — Telephone Encounter (Signed)
I would like her to see her PCP to see if loose stools are from anything else first

## 2019-08-11 NOTE — Telephone Encounter (Signed)
I spoke with pt and gave her information from Dr Radford Pax.  She reports she is no longer having loose stools

## 2019-08-15 ENCOUNTER — Encounter: Payer: Self-pay | Admitting: Internal Medicine

## 2019-08-18 NOTE — Progress Notes (Signed)
Virtual Visit via Video Note  I connected with [name] [name] on 08/19/19 at  2:00 PM EST by a video enabled telemedicine application and verified that I am speaking with the correct person using two identifiers.   I discussed the limitations of evaluation and management by telemedicine and the availability of in person appointments. The patient expressed understanding and agreed to proceed.  Present for the visit:  Myself, Dr Billey Gosling, [name] [name].  The patient is currently at home and I am in the office.    No referring provider.    History of Present Illness: She is here for an acute visit for cold symptoms.   Her symptoms started   She is experiencing sinus headaches and sinus pressure in her cheeks.  She has coughed a few times and will get green sputum.  She denies fever, SOB, wheeze, sore throat, ear pain.    She has tried taking inhalers, flonase .  Review of Systems  Constitutional: Negative for chills and fever.  HENT: Positive for sinus pain. Negative for congestion, ear pain and sore throat.   Respiratory: Positive for cough (occ green phlegm). Negative for shortness of breath and wheezing.   Cardiovascular: Negative for chest pain.  Neurological: Positive for headaches.     Social History   Socioeconomic History  . Marital status: Single    Spouse name: Not on file  . Number of children: 2  . Years of education: 63  . Highest education level: Not on file  Occupational History  . Occupation: Solicitor: [employer]  Social Needs  . Financial resource strain: Not on file  . Food insecurity    Worry: Not on file    Inability: Not on file  . Transportation needs    Medical: Not on file    Non-medical: Not on file  Tobacco Use  . Smoking status: Never Smoker  . Smokeless tobacco: Never Used  Substance and Sexual Activity  . Alcohol use: No    Alcohol/week: 0.0 standard drinks  . Drug use: No  . Sexual activity: Not  on file  Lifestyle  . Physical activity    Days per week: Not on file    Minutes per session: Not on file  . Stress: Not on file  Relationships  . Social Herbalist on phone: Not on file    Gets together: Not on file    Attends religious service: Not on file    Active member of club or organization: Not on file    Attends meetings of clubs or organizations: Not on file    Relationship status: Not on file  Other Topics Concern  . 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    Patient is single and lives alone.    Patient is right handed.      Observations/Objective: Appears well in NAD Breathing normally Skin appears warm and dry  Assessment and Plan:  See Problem List for Assessment and Plan of chronic medical problems.   Follow Up Instructions:    I discussed the assessment and treatment plan with the patient. The patient was provided an opportunity to ask questions and all were answered. The patient agreed with the plan and demonstrated an understanding of the instructions.   The patient was advised to call back or seek an in-person evaluation if the symptoms worsen  or if the condition fails to improve as anticipated.    Binnie Rail, MD

## 2019-08-19 ENCOUNTER — Ambulatory Visit (INDEPENDENT_AMBULATORY_CARE_PROVIDER_SITE_OTHER): Payer: BC Managed Care – PPO | Admitting: Internal Medicine

## 2019-08-19 ENCOUNTER — Telehealth: Payer: Self-pay | Admitting: Cardiology

## 2019-08-19 ENCOUNTER — Encounter: Payer: Self-pay | Admitting: Internal Medicine

## 2019-08-19 DIAGNOSIS — J0100 Acute maxillary sinusitis, unspecified: Secondary | ICD-10-CM

## 2019-08-19 DIAGNOSIS — J019 Acute sinusitis, unspecified: Secondary | ICD-10-CM | POA: Insufficient documentation

## 2019-08-19 MED ORDER — FLUCONAZOLE 150 MG PO TABS: 150.0000 mg | ORAL_TABLET | Freq: Once | ORAL | 0 refills | Status: AC

## 2019-08-19 MED ORDER — PREDNISONE 10 MG PO TABS: ORAL_TABLET | ORAL | 0 refills | Status: DC

## 2019-08-19 MED ORDER — AMOXICILLIN-POT CLAVULANATE 875-125 MG PO TABS: 1.0000 | ORAL_TABLET | Freq: Two times a day (BID) | ORAL | 0 refills | Status: DC

## 2019-08-19 NOTE — Telephone Encounter (Signed)
Called pt and advised pt that she would have to contact her PCP, Dr. Quay Burow, because that doctor follows her and prescribes Xarelto. I informed pt that if she has any other problems, questions or concerns to contact our office. Pt verbalized understanding.

## 2019-08-19 NOTE — Addendum Note (Signed)
Addended by: Binnie Rail on: 08/19/2019 02:38 PM   Modules accepted: Orders

## 2019-08-19 NOTE — Assessment & Plan Note (Signed)
Likely bacterial  Start augmentin otc cold medications Rest, fluid Call if no improvement  

## 2019-08-19 NOTE — Telephone Encounter (Signed)
Patient calling the office for samples of medication:   1.  What medication and dosage are you requesting samples for? rivaroxaban (XARELTO) 20 MG TABS tablet  2.  Are you currently out of this medication? Yes  Patient usually gets her medication via mail order, but will not be able to get the medication in time to take it

## 2019-08-21 ENCOUNTER — Encounter: Payer: Self-pay | Admitting: Internal Medicine

## 2019-08-22 ENCOUNTER — Encounter: Payer: Self-pay | Admitting: Internal Medicine

## 2019-08-22 DIAGNOSIS — E876 Hypokalemia: Secondary | ICD-10-CM

## 2019-08-22 NOTE — Telephone Encounter (Signed)
Spoke with pt and she was concerned that the increased dose of potassium that was given by cardiology has been causing her diarrhea. She tried to call them to let them know the side effects she was having and was told that ''this is not a side effect of potassium" Pt was upset because she spoke with pharmacy, triage nurse, and looked it up on google and found that this was a side effect. She wanted you to know that she has decreased her dose of her potassium to what she was taking before and she would like to know when you think she should have her potassium rechecked. She stated that you could send her a [employer]. She is not planning on going back to cardiology.

## 2019-08-25 ENCOUNTER — Ambulatory Visit (INDEPENDENT_AMBULATORY_CARE_PROVIDER_SITE_OTHER): Payer: BC Managed Care – PPO

## 2019-08-25 ENCOUNTER — Other Ambulatory Visit (INDEPENDENT_AMBULATORY_CARE_PROVIDER_SITE_OTHER): Payer: BC Managed Care – PPO

## 2019-08-25 ENCOUNTER — Other Ambulatory Visit: Payer: Self-pay | Admitting: Internal Medicine

## 2019-08-25 ENCOUNTER — Encounter: Payer: Self-pay | Admitting: Internal Medicine

## 2019-08-25 DIAGNOSIS — E876 Hypokalemia: Secondary | ICD-10-CM

## 2019-08-25 DIAGNOSIS — Z1159 Encounter for screening for other viral diseases: Secondary | ICD-10-CM

## 2019-08-25 LAB — BASIC METABOLIC PANEL
BUN: 13 mg/dL (ref 6–23)
CO2: 26 mEq/L (ref 19–32)
Calcium: 10.1 mg/dL (ref 8.4–10.5)
Chloride: 103 mEq/L (ref 96–112)
Creatinine, Ser: 0.83 mg/dL (ref 0.40–1.20)
GFR: 85.66 mL/min (ref >60.00)
Glucose, Bld: 96 mg/dL (ref 70–99)
Potassium: 4 mEq/L (ref 3.5–5.1)
Sodium: 139 mEq/L (ref 135–145)

## 2019-08-25 LAB — SARS CORONAVIRUS 2 (TAT 6-24 HRS): SARS Coronavirus 2: NEGATIVE

## 2019-08-27 ENCOUNTER — Encounter: Payer: Self-pay | Admitting: Internal Medicine

## 2019-08-27 ENCOUNTER — Other Ambulatory Visit: Payer: Self-pay

## 2019-08-27 ENCOUNTER — Ambulatory Visit (AMBULATORY_SURGERY_CENTER): Payer: BC Managed Care – PPO | Admitting: Internal Medicine

## 2019-08-27 VITALS — BP 107/70 | HR 96 | Temp 98.7°F | Resp 16 | Ht 64.0 in | Wt 215.0 lb

## 2019-08-27 DIAGNOSIS — Z86010 Personal history of colonic polyps: Secondary | ICD-10-CM | POA: Diagnosis present

## 2019-08-27 DIAGNOSIS — D120 Benign neoplasm of cecum: Secondary | ICD-10-CM

## 2019-08-27 MED ORDER — SODIUM CHLORIDE 0.9 % IV SOLN: 500.0000 mL | Freq: Once | INTRAVENOUS | Status: DC

## 2019-08-27 NOTE — Progress Notes (Signed)
Called to room to assist during endoscopic procedure.  Patient ID and intended procedure confirmed with present staff. Received instructions for my participation in the procedure from the performing physician.  

## 2019-08-27 NOTE — Op Note (Signed)
[facility] Patient Name: [name] [name] Procedure Date: 08/27/2019 9:16 AM MRN: [id number] Endoscopist: Docia Chuck. [name] [name] , MD Age: 58 Referring MD:  Date of Birth: 1961-02-07 Gender: Female Account #: [id number] Procedure:                Colonoscopy with cold snare polypectomy x 1 Indications:              High risk colon cancer surveillance: Personal                            history of non-advanced adenomas, High risk colon                            cancer surveillance: Personal history of sessile                            serrated colon polyps (less than 10 mm in size)                            with no dysplasia. Previous examinations 2007, 2015 Medicines:                Monitored Anesthesia Care Procedure:                Pre-Anesthesia Assessment:                           - Prior to the procedure, a History and Physical                            was performed, and patient medications and                            allergies were reviewed. The patient's tolerance of                            previous anesthesia was also reviewed. The risks                            and benefits of the procedure and the sedation                            options and risks were discussed with the patient.                            All questions were answered, and informed consent                            was obtained. Prior Anticoagulants: The patient has                            taken Xarelto (rivaroxaban), last dose was 3 days                            prior to procedure. ASA Grade Assessment: III - A  patient with severe systemic disease. After                            reviewing the risks and benefits, the patient was                            deemed in satisfactory condition to undergo the                            procedure.                           After obtaining informed consent, the colonoscope                            was passed  under direct vision. Throughout the                            procedure, the patient's blood pressure, pulse, and                            oxygen saturations were monitored continuously. The                            Colonoscope was introduced through the anus and                            advanced to the the cecum, identified by                            appendiceal orifice and ileocecal valve. The                            ileocecal valve, appendiceal orifice, and rectum                            were photographed. The quality of the bowel                            preparation was excellent. The colonoscopy was                            performed without difficulty. The patient tolerated                            the procedure well. The bowel preparation used was                            SUPREP via split dose instruction. Scope In: 9:28:37 AM Scope Out: 9:39:50 AM Scope Withdrawal Time: 0 hours 9 minutes 6 seconds  Total Procedure Duration: 0 hours 11 minutes 13 seconds  Findings:                 A 6 mm polyp was found in the cecum. The polyp was  removed with a cold snare. Resection and retrieval                            were complete.                           Internal hemorrhoids were found during retroflexion.                           The exam was otherwise without abnormality on                            direct and retroflexion views. Complications:            No immediate complications. Estimated blood loss:                            None. Estimated Blood Loss:     Estimated blood loss: none. Impression:               - One 6 mm polyp in the cecum, removed with a cold                            snare. Resected and retrieved.                           - Internal hemorrhoids.                           - The examination was otherwise normal on direct                            and retroflexion views. Recommendation:           - Repeat  colonoscopy in 5 years for surveillance.                           - Patient has a contact number available for                            emergencies. The signs and symptoms of potential                            delayed complications were discussed with the                            patient. Return to normal activities tomorrow.                            Written discharge instructions were provided to the                            patient.                           - Resume previous diet.                           -  Continue present medications.                           - Resume Xarelto today                           - Await pathology results. Docia Chuck. [name] Pastor, MD 08/27/2019 9:46:03 AM This report has been signed electronically.

## 2019-08-27 NOTE — Progress Notes (Signed)
Pt Drowsy. VSS. To PACU, report to RN. No anesthetic complications noted.  

## 2019-08-27 NOTE — Patient Instructions (Signed)
  Please read handouts provided. Continue present medications. Await pathology results. Resume Xarelto today.     YOU HAD AN ENDOSCOPIC PROCEDURE TODAY AT [facility]:   Refer to the procedure report that was given to you for any specific questions about what was found during the examination.  If the procedure report does not answer your questions, please call your gastroenterologist to clarify.  If you requested that your care partner not be given the details of your procedure findings, then the procedure report has been included in a sealed envelope for you to review at your convenience later.  YOU SHOULD EXPECT: Some feelings of bloating in the abdomen. Passage of more gas than usual.  Walking can help get rid of the air that was put into your GI tract during the procedure and reduce the bloating. If you had a lower endoscopy (such as a colonoscopy or flexible sigmoidoscopy) you may notice spotting of blood in your stool or on the toilet paper. If you underwent a bowel prep for your procedure, you may not have a normal bowel movement for a few days.  Please Note:  You might notice some irritation and congestion in your nose or some drainage.  This is from the oxygen used during your procedure.  There is no need for concern and it should clear up in a day or so.  SYMPTOMS TO REPORT IMMEDIATELY:   Following lower endoscopy (colonoscopy or flexible sigmoidoscopy):  Excessive amounts of blood in the stool  Significant tenderness or worsening of abdominal pains  Swelling of the abdomen that is new, acute  Fever of 100F or higher   For urgent or emergent issues, a gastroenterologist can be reached at any hour by calling [phone].   DIET:  We do recommend a small meal at first, but then you may proceed to your regular diet.  Drink plenty of fluids but you should avoid alcoholic beverages for 24 hours.  ACTIVITY:  You should plan to take it easy for the rest of today  and you should NOT DRIVE or use heavy machinery until tomorrow (because of the sedation medicines used during the test).    FOLLOW UP: Our staff will call the number listed on your records 48-72 hours following your procedure to check on you and address any questions or concerns that you may have regarding the information given to you following your procedure. If we do not reach you, we will leave a message.  We will attempt to reach you two times.  During this call, we will ask if you have developed any symptoms of COVID 19. If you develop any symptoms (ie: fever, flu-like symptoms, shortness of breath, cough etc.) before then, please call [phone].  If you test positive for Covid 19 in the 2 weeks post procedure, please call and report this information to [place].    If any biopsies were taken you will be contacted by phone or by letter within the next 1-3 weeks.  Please call us at [phone] if you have not heard about the biopsies in 3 weeks.    SIGNATURES/CONFIDENTIALITY: You and/or your care partner have signed paperwork which will be entered into your electronic medical record.  These signatures attest to the fact that that the information above on your After Visit Summary has been reviewed and is understood.  Full responsibility of the confidentiality of this discharge information lies with you and/or your care-partner.

## 2019-08-28 ENCOUNTER — Encounter: Payer: Self-pay | Admitting: Internal Medicine

## 2019-08-29 ENCOUNTER — Telehealth: Payer: Self-pay | Admitting: Internal Medicine

## 2019-08-29 ENCOUNTER — Encounter: Payer: Self-pay | Admitting: Internal Medicine

## 2019-08-29 ENCOUNTER — Telehealth: Payer: Self-pay | Admitting: *Deleted

## 2019-08-29 NOTE — Telephone Encounter (Signed)
Spoke with pt and let her know that Dr. [name] has not reviewed the report yet but once he has we will contact her with the results.

## 2019-08-29 NOTE — Telephone Encounter (Signed)
  Follow up Call-  Call back number 08/27/2019  Post procedure Call Back phone  # [phone]  Permission to leave phone message Yes  Some recent data might be hidden     Patient questions:  Do you have a fever, pain , or abdominal swelling? No. Pain Score  0 *  Have you tolerated food without any problems? Yes.    Have you been able to return to your normal activities? Yes.    Do you have any questions about your discharge instructions: Diet   No. Medications  No. Follow up visit  No.  Do you have questions or concerns about your Care? No.  Actions: * If pain score is 4 or above: 1. No action needed, pain <4.Have you developed a fever since your procedure? no  2.   Have you had an respiratory symptoms (SOB or cough) since your procedure? no  3.   Have you tested positive for COVID 19 since your procedure no  4.   Have you had any family members/close contacts diagnosed with the COVID 19 since your procedure?  no   If yes to any of these questions please route to Joylene John, RN and Alphonsa Gin, Therapist, sports.

## 2019-09-03 NOTE — Progress Notes (Signed)
Subjective:    Patient ID: [name] [name], female    DOB: [birth year], 58 y.o.   MRN: [id number]  HPI The patient is here for an acute visit.  Right shoulder pain:  She fell a few days ago.  She returned quick and fell onto her right hand.  She did have some mild wrist pain, which has improved.  She feels her shoulder took the brunt of the pressure of this fall.  She has had shoulder pain and decreased range of motion since the fall.  She has had a couple of abrasions, but no major injuries.    The ROM of the shoulder has improved, but is still decreased.  She has pain with lifting.  She has been applying Tiger balm to the shoulder and taking ibuprofen.  She denies numbness/tingling or pain in the arm or hand.        Medications and allergies reviewed with patient and updated if appropriate.  Patient Active Problem List   Diagnosis Date Noted  . Acute sinus infection 08/19/2019  . Atrial flutter ([place]) 02/28/2019  . Psychophysiological insomnia 11/21/2018  . RLS (restless legs syndrome) 11/21/2018  . Sleep difficulties 08/06/2018  . Trigger finger 01/23/2018  . Meralgia paraesthetica, right 11/05/2017  . Iron deficiency 10/09/2017  . Vitamin D deficiency 06/15/2017  . Lumbar radiculopathy 04/21/2017  . Trigger index finger of left hand 04/21/2017  . Obesity (BMI 35.0-39.9 without comorbidity) 04/21/2017  . Left hip pain 10/31/2016  . Pes planus 10/31/2016  . Posterior tibialis muscle dysfunction 10/31/2016  . Memory difficulties 09/03/2016  . Breast pain, right 08/24/2016  . Palpitation 04/04/2016  . 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 Malignant neoplasm of upper-outer quadrant of right breast in female, 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[place]) 11/03/2015  . S/P mastectomy 05/13/2015  . Chemotherapy-induced neuropathy ([place]) 04/21/2015  . Genetic testing 12/11/2014  . Breast cancer of upper-outer quadrant of right female breast ([place]) 10/23/2014  . Anxiety 04/29/2012  . Allergic  rhinitis 11/29/2009  . 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 CONSTIPATION, CHRONIC 08/17/2008  . Dyslipidemia 04/24/2008  . METABOLIC SYNDROME X 99991111  . GERD 01/07/2008  . Essential hypertension 09/20/2007  . Multiple sclerosis ([place]) 09/18/2007    Current Outpatient Medications on File Prior to Visit  Medication Sig Dispense Refill  . amoxicillin-clavulanate (AUGMENTIN) 875-125 MG tablet Take 1 tablet by mouth 2 (two) times daily. 20 tablet 0  . azelastine (ASTELIN) 0.1 % nasal spray azelastine 137 mcg (0.1 %) nasal spray aerosol    . b complex vitamins tablet Take 1 tablet by mouth daily.    . ergocalciferol (VITAMIN D2) 1.25 MG (50000 UT) capsule ergocalciferol (vitamin D2) 1,250 mcg (50,000 unit) capsule    . ferrous gluconate (FERGON) 324 MG tablet Take 1 tablet (324 mg total) by mouth 2 (two) times daily with a meal. (Patient taking differently: Take 324 mg by mouth 3 (three) times a week. ) 180 tablet 3  . fluticasone (FLONASE) 50 MCG/ACT nasal spray Place 2 sprays into both nostrils daily. 16 g 6  . gabapentin (NEURONTIN) 100 MG capsule Take 3 capsules (300 mg total) by mouth 3 (three) times daily. 810 capsule 1  . glatiramer (COPAXONE) 20 MG/ML SOSY injection Copaxone 20 mg/mL subcutaneous syringe    . hydrochlorothiazide (HYDRODIURIL) 25 MG tablet Take 1 tablet (25 mg total) by mouth daily. 90 tablet 1  . linaclotide (LINZESS) 145 MCG CAPS capsule Take 2 capsules (290 mcg total) by mouth daily. 180 capsule 1  . loratadine (  CLARITIN) 10 MG tablet Take 10 mg by mouth daily.    [name] losartan (COZAAR) 50 MG tablet Take 1 tablet (50 mg total) by mouth daily. 90 tablet 1  . metoprolol tartrate (LOPRESSOR) 50 MG tablet Take 1 tablet (50 mg total) by mouth 2 (two) times daily. 180 tablet 1  . pantoprazole (PROTONIX) 40 MG tablet TAKE 1 TABLET BY MOUTH EVERY DAY (Patient taking differently: 3 times per week) 90 tablet 1  . potassium chloride SA (KLOR-CON) 20 MEQ tablet Take 2 (20 meq) tablets 40 meq in  the AM and 1 (20 meq) tablet 20 meq in the PM 270 tablet 3  . predniSONE (DELTASONE) 10 MG tablet 3 tabs po qd x 3 days, then 2 tabs po qd x 3 days, then 1 tab po qd x 3 days 18 tablet 0  . Probiotic Product (PROBIOTIC PO) Take 2 capsules by mouth daily.    . rivaroxaban (XARELTO) 20 MG TABS tablet Take 1 tablet (20 mg total) by mouth daily with supper. 90 tablet 1  . SYMBICORT 160-4.5 MCG/ACT inhaler INHALE 2 PUFFS INTO THE LUNGS TWICE DAILY (Patient taking differently: Takes as needed) 10.2 g 0  . traZODone (DESYREL) 50 MG tablet Take 1-2 tablets (50-100 mg total) by mouth at bedtime as needed for sleep. 60 tablet 3  . TURMERIC PO Take by mouth daily.    [name] VITAMIN D PO Take 2,000 Units by mouth daily.     No current facility-administered medications on file prior to visit.    Past Medical History:  Diagnosis Date  . Acute non-recurrent maxillary sinusitis 01/08/2017  . Allergic rhinitis due to other allergen   . Allergy   . Angioedema    ACEI  . 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 Anxiety   . Breast cancer of upper-outer quadrant of right female breast ([place]) 10/23/2014   s/p r mastectomy, neoadj chemo completed 04/21/15; neg genetic testing  . 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  . CHEST PAIN-UNSPECIFIED 05/19/2010   Qualifier: Diagnosis of  By: Aundra Dubin, MD, [place]    . Chronic fatigue 06/08/2016  . Colon polyps 05/2014   adenomatous, colo q 5y  . Conjunctiva disorder 08/06/2018  . CONSTIPATION, CHRONIC   . Cough 04/25/2016  . Depression 09/03/2016  . Ear canal dryness 01/23/2018  . FATIGUE 06/24/2008   Qualifier: Diagnosis of  By: Wynona Luna   . GERD   . Herpes zoster without complication 99991111  . Hot flashes   . HYPERLIPIDEMIA   . HYPERTENSION   . Hypokalemia 12/10/2014  . Lactose intolerance   . LRTI (lower respiratory tract infection) 09/05/2017  . LUQ cramping 04/21/2017  . Migraine headache   . MIGRAINES, HX OF 09/18/2007   Qualifier:  Diagnosis of  By: Danelle Earthly CMA, Darlene    . MS (multiple sclerosis) ([place]) 11/03/2015  . Multiple sclerosis ([place])   . Muscle strain 10/21/2016  . OBESITY, TRUNCAL   . OTHER SYMPTOMS INVOLVING DIGESTIVE SYSTEM OTHER 12/14/2009   Qualifier: Diagnosis of  By: Asa Lente MD, Jannifer Rodney   . Otitis, externa, infective 05/24/2016  . PERSONAL HX COLONIC POLYPS 04/21/2009   Qualifier: Diagnosis of  By: Trellis Paganini PA-c, Amy S   . SINUSITIS 01/14/2010   Qualifier: Diagnosis of  By: Asa Lente MD, Jannifer Rodney Upper respiratory tract infection 09/23/2016  . UTI 08/19/2010   Qualifier: Diagnosis of  By: Marca Ancona RMA, Lucy    . Wheezing 09/10/2017    Past Surgical History:  Procedure Laterality Date  . BREAST BIOPSY Right    [place]   . COLONOSCOPY    . MASTECTOMY Right   . PORT-A-CATH REMOVAL Left 05/13/2015   Procedure: REMOVAL PORT-A-CATH;  Surgeon: [name], MD;  Location: [facility];  Service: General;  Laterality: Left;  . PORTACATH PLACEMENT Left 11/06/2014   Procedure: INSERTION PORT-A-CATH;  Surgeon: [name], MD;  Location: [facility];  Service: General;  Laterality: Left;  . SIMPLE MASTECTOMY WITH AXILLARY SENTINEL NODE BIOPSY Right 05/13/2015   Procedure: RIGHT TOTAL  MASTECTOMY WITH RIGHT  AXILLARY SENTINEL NODE BIOPSY;  Surgeon: [name], MD;  Location: [facility];  Service: General;  Laterality: Right;  . TUBAL LIGATION      Social History   Socioeconomic History  . Marital status: Single    Spouse name: Not on file  . Number of children: 2  . Years of education: 8  . Highest education level: Not on file  Occupational History  . Occupation: Solicitor: [employer]  Tobacco Use  . Smoking status: Never Smoker  . Smokeless tobacco: Never Used  Substance and Sexual Activity  . Alcohol use: No    Alcohol/week: 0.0 standard drinks  . Drug use: No  . Sexual activity: Not on file  Other Topics Concern  . Not on file  Social History Narrative     Patient is a Programmer, 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    Patient is single and lives alone.    Patient is right handed.    Social Determinants of Health   Financial Resource Strain:   . Difficulty of Paying Living Expenses: Not on file  Food Insecurity:   . Worried About Charity fundraiser in the Last Year: Not on file  . Ran Out of Food in the Last Year: Not on file  Transportation Needs:   . Lack of Transportation (Medical): Not on file  . Lack of Transportation (Non-Medical): Not on file  Physical Activity:   . Days of Exercise per Week: Not on file  . Minutes of Exercise per Session: Not on file  Stress:   . Feeling of Stress : Not on file  Social Connections:   . Frequency of Communication with Friends and Family: Not on file  . Frequency of Social Gatherings with Friends and Family: Not on file  . Attends Religious Services: Not on file  . Active Member of Clubs or Organizations: Not on file  . Attends Archivist Meetings: Not on file  . Marital Status: Not on file    Family History  Problem Relation Age of Onset  . Coronary artery disease Mother   . Heart disease Mother   . Diabetes Mother   . Hypertension Mother   . Stroke Mother   . Alcohol abuse Father   . COPD Maternal Grandmother   . Hypertension Other   . Hyperlipidemia Other   . Colon cancer Neg Hx   . Rectal cancer Neg Hx   . Stomach cancer Neg Hx   . Esophageal cancer Neg Hx     Review of Systems Per HPI    Objective:   Vitals:   09/04/19 0752  BP: 128/76  Pulse: 63  Temp: 98.1 F (36.7 C)  SpO2: 99%   BP Readings from Last 3 Encounters:  09/04/19 128/76  08/27/19 107/70  06/25/19 124/72   Wt Readings from Last 3 Encounters:  09/04/19  213 lb 6.4 oz (96.8 kg)  08/27/19 215 lb (97.5 kg)  07/24/19 215 lb (97.5 kg)   Body mass index is 36.63 kg/m.   Physical Exam    A Right Shoulder exam was performed.   SWELLING: none   EFFUSION: no  WARMTH: no warmth  TENDERNESS: mild tenderness in anterior aspect of shoulder and lateral aspect of shoulder  ROM: dec ROM with pain  NEUROLOGICAL EXAM: normal sensation and strength  PULSES: normal        Assessment & Plan:    See Problem List for Assessment and Plan of chronic medical problems.    This visit occurred during the SARS-CoV-2 public health emergency.  Safety protocols were in place, including screening questions prior to the visit, additional usage of staff PPE, and extensive cleaning of exam room while observing appropriate contact time as indicated for disinfecting solutions.

## 2019-09-04 ENCOUNTER — Encounter: Payer: Self-pay | Admitting: Internal Medicine

## 2019-09-04 ENCOUNTER — Other Ambulatory Visit: Payer: Self-pay

## 2019-09-04 ENCOUNTER — Ambulatory Visit (INDEPENDENT_AMBULATORY_CARE_PROVIDER_SITE_OTHER): Payer: BC Managed Care – PPO | Admitting: Internal Medicine

## 2019-09-04 VITALS — BP 128/76 | HR 63 | Temp 98.1°F | Ht 64.0 in | Wt 213.4 lb

## 2019-09-04 DIAGNOSIS — M25511 Pain in right shoulder: Secondary | ICD-10-CM | POA: Diagnosis not present

## 2019-09-04 NOTE — Patient Instructions (Addendum)
Continue shoulder exercises.   Continue ibuprofen.  You can take Tylenol if needed.    Alternate heat and ice.   You can apply salon pas.      Please call if there is no improvement in your symptoms.

## 2019-09-04 NOTE — Assessment & Plan Note (Addendum)
Pain and dec ROM after fall Symptoms improving with advil and topical medication  She is doing shoulder exercises Continue above Can try salon pas Heat/ice  call if there is no improvement in your symptoms.

## 2019-09-15 ENCOUNTER — Telehealth: Payer: Self-pay | Admitting: *Deleted

## 2019-09-15 DIAGNOSIS — M25511 Pain in right shoulder: Secondary | ICD-10-CM

## 2019-09-15 NOTE — Telephone Encounter (Signed)
Rec'd call pt states she saw MD week or so ago for her shoulder. Pt states she is still having pain in her shoulder. Taking the ibuprofen as rx, but not really helping it. Pt is wanting to see if MD would order an xray since this is the side that she had breat cancer & lymph nodes removed. Inform pt that MD is put of the office this week. She states it was ok to hold msg until MD return on Monday 09/22/19. She would continue using the ice and ibuprofen.Marland KitchenJohny Chess

## 2019-09-15 NOTE — Telephone Encounter (Signed)
Xray of right shoulder ordered.   Please let pt know - please schedule for [facility].   Let her know we can set her up with orthopedics or sports medicine if she wants

## 2019-09-16 NOTE — Telephone Encounter (Signed)
Appointment scheduled.

## 2019-09-17 ENCOUNTER — Encounter: Payer: Self-pay | Admitting: Family Medicine

## 2019-09-17 ENCOUNTER — Ambulatory Visit (INDEPENDENT_AMBULATORY_CARE_PROVIDER_SITE_OTHER): Payer: BC Managed Care – PPO

## 2019-09-17 ENCOUNTER — Other Ambulatory Visit: Payer: Self-pay

## 2019-09-17 ENCOUNTER — Ambulatory Visit (INDEPENDENT_AMBULATORY_CARE_PROVIDER_SITE_OTHER): Payer: BC Managed Care – PPO | Admitting: Family Medicine

## 2019-09-17 VITALS — BP 142/82 | HR 67 | Ht 64.0 in | Wt 215.4 lb

## 2019-09-17 DIAGNOSIS — Z9011 Acquired absence of right breast and nipple: Secondary | ICD-10-CM

## 2019-09-17 DIAGNOSIS — M25511 Pain in right shoulder: Secondary | ICD-10-CM

## 2019-09-17 NOTE — Progress Notes (Signed)
I, Wendy Poet, LAT, ATC, am serving as scribe for Dr. Lynne Leader.  [name] [name] is a Delightfully pleasant  58 y.o. female who presents to [facility] [facility] today for R shoulder pain.  Pt was seen by her PCP on 09/04/19 after suffering a fall on 09/01/19 in which she landed on her R side.  She reports R shoulder pain and decreased ROM.  She has tried [employer] and IBU.  Since her visit w/ her PCP, pt reports that her R shoulder pain has improved approximately 90%.  She reports full R shoulder ROM but does still have pain when trying to lift objects.  She denies any mechanical symptoms in her R shoulder.  She con't to use Tiger balm and 800mg  IBU.  She has a history of right-sided mastectomy with some lymph node dissection and is worried about developing lymphedema.  Fortunately she denies any significant swelling in her right arm.  She has been doing some home exercises taught to her initially following her mastectomy for shoulder mobility.    ROS:  As above  Exam:  BP (!) 142/82 (BP Location: Left Arm, Patient Position: Sitting, Cuff Size: Large)   Pulse 67   Ht 5\' 4"  (1.626 m)   Wt 215 lb 6.4 oz (97.7 kg)   LMP 11/01/2012   SpO2 98%   BMI 36.97 kg/m  Wt Readings from Last 5 Encounters:  09/17/19 215 lb 6.4 oz (97.7 kg)  09/04/19 213 lb 6.4 oz (96.8 kg)  08/27/19 215 lb (97.5 kg)  07/24/19 215 lb (97.5 kg)  06/25/19 213 lb (96.6 kg)   General: Well Developed, well nourished, and in no acute distress.  Neuro/Psych: Alert and oriented x3, extra-ocular muscles intact, able to move all 4 extremities, sensation grossly intact. Skin: Warm and dry, no rashes noted.  Respiratory: Not using accessory muscles, speaking in full sentences, trachea midline.  Cardiovascular: Pulses palpable, no extremity edema. Abdomen: Does not appear distended. MSK:  Right shoulder: Normal-appearing Nontender. Range of motion: Abduction full, internal rotation lumbar  spine, external rotation full. Strength: 5/5 abduction.  External rotation 4/5.  Internal rotation 5/5. Mildly positive Hawkins test.  Minimally positive Neer's test.  Negative empty can test. Mildly positive crossover arm compression test. Negative Yergason's and speeds test.  Left shoulder: Normal-appearing.  Nontender. Full range of motion. Full strength. Negative impingement testing.  Negative biceps tendinitis testing.  Pulses cap refill and sensation intact bilateral upper extremities.    Lab and Radiology Results  X-ray images right shoulder obtained today personally and independently reviewed Mild glenohumeral DJD.  Moderate AC DJD.  Cortical irregularity at the insertion of rotator cuff tendon on humeral head.  Best seen on scapular Y view.  No acute fractures.  No concerning bone lesions. My interpretation: Insertional rotator cuff tendinopathy Await formal radiology review    Assessment and Plan: 58 y.o. female with  Right shoulder pain after fall.  Fortunately patient has already had significant improvement with a little bit of time and home exercise program on her own.  She is in an increased risk for complications secondary to her mastectomy.  Complications would include lymphedema or adhesive capsulitis. Plan to proceed with home exercise program taught by ATC in clinic today.  Additionally will refer to physical therapy.  Also recommend topical diclofenac gel.  Would like patient to limit ibuprofen use very sparingly if possible.  Hopefully the accommodation of topical diclofenac gel and oral Tylenol will be a bit  safer especially with her Xarelto than oral ibuprofen if possible.  Check back with me in 4 to 6 weeks if not improved.  Patient expresses understanding agreement.    Orders Placed This Encounter  Procedures  . DG Shoulder Right    Standing Status:   Future    Number of Occurrences:   1    Standing Expiration Date:   11/15/2020    Order Specific  Question:   Reason for Exam (SYMPTOM  OR DIAGNOSIS REQUIRED)    Answer:   eval right shoulder pain after fall. Hx rt mastectomy    Order Specific Question:   Is patient pregnant?    Answer:   No    Order Specific Question:   Preferred imaging location?    Answer:   [facility]    Order Specific Question:   Release to patient    Answer:   Immediate    Order Specific Question:   Radiology Contrast Protocol - do NOT remove file path    Answer:   [email or web address]  . Ambulatory referral to Physical Therapy    Referral Priority:   Routine    Referral Type:   Physical Medicine    Referral Reason:   Specialty Services Required    Requested Specialty:   Physical Therapy   No orders of the defined types were placed in this encounter.   Historical information moved to improve visibility of documentation.  Past Medical History:  Diagnosis Date  . Acute non-recurrent maxillary sinusitis 01/08/2017  . Allergic rhinitis due to other allergen   . Allergy   . Angioedema    ACEI  . 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 Anxiety   . Breast cancer of upper-outer quadrant of right female breast ([place]) 10/23/2014   s/p r mastectomy, neoadj chemo completed 04/21/15; neg genetic testing  . 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  . CHEST PAIN-UNSPECIFIED 05/19/2010   Qualifier: Diagnosis of  By: Aundra Dubin, MD, [place]    . Chronic fatigue 06/08/2016  . Colon polyps 05/2014   adenomatous, colo q 5y  . Conjunctiva disorder 08/06/2018  . CONSTIPATION, CHRONIC   . Cough 04/25/2016  . Depression 09/03/2016  . Ear canal dryness 01/23/2018  . FATIGUE 06/24/2008   Qualifier: Diagnosis of  By: Wynona Luna   . GERD   . Herpes zoster without complication 99991111  . Hot flashes   . HYPERLIPIDEMIA   . HYPERTENSION   . Hypokalemia 12/10/2014  . Lactose intolerance   . LRTI (lower respiratory tract infection) 09/05/2017  . LUQ cramping  04/21/2017  . Migraine headache   . MIGRAINES, HX OF 09/18/2007   Qualifier: Diagnosis of  By: Danelle Earthly CMA, Darlene    . MS (multiple sclerosis) ([place]) 11/03/2015  . Multiple sclerosis ([place])   . Muscle strain 10/21/2016  . OBESITY, TRUNCAL   . OTHER SYMPTOMS INVOLVING DIGESTIVE SYSTEM OTHER 12/14/2009   Qualifier: Diagnosis of  By: Asa Lente MD, Jannifer Rodney   . Otitis, externa, infective 05/24/2016  . PERSONAL HX COLONIC POLYPS 04/21/2009   Qualifier: Diagnosis of  By: Trellis Paganini PA-c, Amy S   . SINUSITIS 01/14/2010   Qualifier: Diagnosis of  By: Asa Lente MD, Jannifer Rodney Upper respiratory tract infection 09/23/2016  . UTI 08/19/2010   Qualifier: Diagnosis of  By: Marca Ancona RMA, Lucy    . Wheezing 09/10/2017   Past Surgical History:  Procedure Laterality Date  . BREAST BIOPSY Right    [place]   . COLONOSCOPY    .  MASTECTOMY Right   . PORT-A-CATH REMOVAL Left 05/13/2015   Procedure: REMOVAL PORT-A-CATH;  Surgeon: [name], MD;  Location: [facility];  Service: General;  Laterality: Left;  . PORTACATH PLACEMENT Left 11/06/2014   Procedure: INSERTION PORT-A-CATH;  Surgeon: [name], MD;  Location: [facility];  Service: General;  Laterality: Left;  . SIMPLE MASTECTOMY WITH AXILLARY SENTINEL NODE BIOPSY Right 05/13/2015   Procedure: RIGHT TOTAL  MASTECTOMY WITH RIGHT  AXILLARY SENTINEL NODE BIOPSY;  Surgeon: [name], MD;  Location: [facility];  Service: General;  Laterality: Right;  . TUBAL LIGATION     Social History   Tobacco Use  . Smoking status: Never Smoker  . Smokeless tobacco: Never Used  Substance Use Topics  . Alcohol use: No    Alcohol/week: 0.0 standard drinks   family history includes Alcohol abuse in her father; COPD in her maternal grandmother; Coronary artery disease in her mother; Diabetes in her mother; Heart disease in her mother; Hyperlipidemia in an other family member; Hypertension in her mother and another family member; Stroke in her  mother.  Medications: Current Outpatient Medications  Medication Sig Dispense Refill  . azelastine (ASTELIN) 0.1 % nasal spray azelastine 137 mcg (0.1 %) nasal spray aerosol    . b complex vitamins tablet Take 1 tablet by mouth daily.    . ergocalciferol (VITAMIN D2) 1.25 MG (50000 UT) capsule ergocalciferol (vitamin D2) 1,250 mcg (50,000 unit) capsule    . ferrous gluconate (FERGON) 324 MG tablet Take 1 tablet (324 mg total) by mouth 2 (two) times daily with a meal. (Patient taking differently: Take 324 mg by mouth 3 (three) times a week. ) 180 tablet 3  . fluticasone (FLONASE) 50 MCG/ACT nasal spray Place 2 sprays into both nostrils daily. 16 g 6  . gabapentin (NEURONTIN) 100 MG capsule Take 3 capsules (300 mg total) by mouth 3 (three) times daily. 810 capsule 1  . glatiramer (COPAXONE) 20 MG/ML SOSY injection Copaxone 20 mg/mL subcutaneous syringe    . hydrochlorothiazide (HYDRODIURIL) 25 MG tablet Take 1 tablet (25 mg total) by mouth daily. 90 tablet 1  . linaclotide (LINZESS) 145 MCG CAPS capsule Take 2 capsules (290 mcg total) by mouth daily. 180 capsule 1  . loratadine (CLARITIN) 10 MG tablet Take 10 mg by mouth daily.    [name] losartan (COZAAR) 50 MG tablet Take 1 tablet (50 mg total) by mouth daily. 90 tablet 1  . metoprolol tartrate (LOPRESSOR) 50 MG tablet Take 1 tablet (50 mg total) by mouth 2 (two) times daily. 180 tablet 1  . pantoprazole (PROTONIX) 40 MG tablet TAKE 1 TABLET BY MOUTH EVERY DAY (Patient taking differently: 3 times per week) 90 tablet 1  . potassium chloride SA (KLOR-CON) 20 MEQ tablet Take 2 (20 meq) tablets 40 meq in the AM and 1 (20 meq) tablet 20 meq in the PM 270 tablet 3  . Probiotic Product (PROBIOTIC PO) Take 2 capsules by mouth daily.    . rivaroxaban (XARELTO) 20 MG TABS tablet Take 1 tablet (20 mg total) by mouth daily with supper. 90 tablet 1  . SYMBICORT 160-4.5 MCG/ACT inhaler INHALE 2 PUFFS INTO THE LUNGS TWICE DAILY (Patient taking differently: Takes as  needed) 10.2 g 0  . traZODone (DESYREL) 50 MG tablet Take 1-2 tablets (50-100 mg total) by mouth at bedtime as needed for sleep. 60 tablet 3  . TURMERIC PO Take by mouth daily.    [name] VITAMIN D PO Take 2,000 Units by  mouth daily.     No current facility-administered medications for this visit.   Allergies  Allergen Reactions  . Ace Inhibitors Anaphylaxis     Angioedema (tongue swelling)  . Clarithromycin Anaphylaxis    Throat swells  . Contrave [Naltrexone-Bupropion Hcl Er]     Chest and sob  . Levaquin [Levofloxacin] Other (See Comments)    Tendon pain - shoulder and calf      Discussed warning signs or symptoms. Please see discharge instructions. Patient expresses understanding.  The above documentation has been reviewed and is accurate and complete Lynne Leader

## 2019-09-17 NOTE — Patient Instructions (Addendum)
Thank you for coming in today. Get xray today.  Use over the counter voltaren gel up to 4 x daily for pain.  Do the home exercises.  Use ibuprofen sparingly. Tylenol or topical voltaren gel is safer especially with Xeralto.  Please perform the exercise program that we have prepared for you and gone over in detail on a daily basis.  In addition to the handout you were provided you can access your program through: [email or web address]   Your unique program code is: [facility]    Recheck with me in 4-6 weeks especially if not better.

## 2019-09-17 NOTE — Progress Notes (Signed)
Mild arthritis present at the shoulder joint.  No fracture or evidence of tumor.  X-ray as expected.

## 2019-09-24 ENCOUNTER — Ambulatory Visit: Payer: BC Managed Care – PPO | Admitting: Physical Therapy

## 2019-09-25 ENCOUNTER — Other Ambulatory Visit: Payer: Self-pay | Admitting: Internal Medicine

## 2019-09-25 DIAGNOSIS — Z1231 Encounter for screening mammogram for malignant neoplasm of breast: Secondary | ICD-10-CM

## 2019-10-01 ENCOUNTER — Telehealth: Payer: Self-pay | Admitting: Internal Medicine

## 2019-10-01 NOTE — Telephone Encounter (Signed)
Still having right shoulder pain. States it is better than it was but still catches. She has been taking a lot of tylenol and it is not helping. Does not want to keep taking tylenol. She saw sports medicine but she really wants to see you since her shoulder is no better. Please advise.

## 2019-10-01 NOTE — Telephone Encounter (Signed)
Can you set her up an appointment with Dr. Tamala Julian.

## 2019-10-01 NOTE — Telephone Encounter (Signed)
Copied from [place] [id number]. Topic: General - Other >> Oct 01, 2019  1:48 PM Keene Breath wrote: Reason for CRM: Patient would like Lovena Le to call patient regarding shoulder pain.  She has a few questions.  CB# [phone]

## 2019-10-01 NOTE — Telephone Encounter (Signed)
I am not sure how much more I can help with her shoulder.  Has she been using the Voltaren gel?  Obviously, if there is no improvement we can have her see an orthopedic or return to sports medicine.  Just let me know what she wants.

## 2019-10-02 NOTE — Telephone Encounter (Signed)
Called patient to schedule. She said that she is going to wait and see if she can manage for now and will call back to schedule if needed.

## 2019-10-09 ENCOUNTER — Ambulatory Visit (INDEPENDENT_AMBULATORY_CARE_PROVIDER_SITE_OTHER): Payer: BC Managed Care – PPO

## 2019-10-09 ENCOUNTER — Ambulatory Visit: Payer: BC Managed Care – PPO | Admitting: Family Medicine

## 2019-10-09 ENCOUNTER — Encounter: Payer: Self-pay | Admitting: Family Medicine

## 2019-10-09 ENCOUNTER — Other Ambulatory Visit: Payer: Self-pay

## 2019-10-09 VITALS — BP 124/80 | HR 90 | Ht 64.0 in | Wt 217.0 lb

## 2019-10-09 DIAGNOSIS — M19011 Primary osteoarthritis, right shoulder: Secondary | ICD-10-CM

## 2019-10-09 DIAGNOSIS — M25511 Pain in right shoulder: Secondary | ICD-10-CM

## 2019-10-09 DIAGNOSIS — M19019 Primary osteoarthritis, unspecified shoulder: Secondary | ICD-10-CM | POA: Insufficient documentation

## 2019-10-09 NOTE — Progress Notes (Signed)
[place] Phone: [phone] Subjective:   [name] [name], am serving as a scribe for Dr. Hulan Saas. This visit occurred during the SARS-CoV-2 public health emergency.  Safety protocols were in place, including screening questions prior to the visit, additional usage of staff PPE, and extensive cleaning of exam room while observing appropriate contact time as indicated for disinfecting solutions.   I'm seeing this patient by the request  of:  Binnie Rail, MD  CC: Right shoulder pain follow-up  [id number]  [name] [name] is a 59 y.o. female coming in with complaint of right shoulder pain. Patient states that she fell one month ago. [place] injury. Pain is anterior with flexion.  Has been doing HEP, using Voltaren, Icy Hot, and Tiger Balm. Was using 800mg  of IBU. Using Tylenol.  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 [name] acute fracture or dislocation.     Past Medical History:  Diagnosis Date  . Acute non-recurrent maxillary sinusitis 01/08/2017  . Allergic rhinitis due to other allergen   . Allergy   . Angioedema    ACEI  . 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 Anxiety   . Breast cancer of upper-outer quadrant of right female breast ([place]) 10/23/2014   s/p r mastectomy, neoadj chemo completed 04/21/15; neg genetic testing  . 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  . CHEST PAIN-UNSPECIFIED 05/19/2010   Qualifier: Diagnosis of  By: Aundra Dubin, MD, [place]    . Chronic fatigue 06/08/2016  . Colon polyps 05/2014   adenomatous, colo q 5y  . Conjunctiva disorder 08/06/2018  . CONSTIPATION, CHRONIC   . Cough 04/25/2016  . Depression 09/03/2016    . Ear canal dryness 01/23/2018  . FATIGUE 06/24/2008   Qualifier: Diagnosis of  By: Wynona Luna   . GERD   . Herpes zoster without complication [id number]  . Hot flashes   . HYPERLIPIDEMIA   . HYPERTENSION   . Hypokalemia 12/10/2014  . Lactose intolerance   . LRTI (lower respiratory tract infection) 09/05/2017  . LUQ cramping 04/21/2017  . Migraine headache   . MIGRAINES, HX OF 09/18/2007   Qualifier: Diagnosis of  By: Danelle Earthly CMA, Darlene    . MS (multiple sclerosis) ([place]) 11/03/2015  . Multiple sclerosis ([place])   . Muscle strain 10/21/2016  . OBESITY, TRUNCAL   . OTHER SYMPTOMS INVOLVING DIGESTIVE SYSTEM OTHER 12/14/2009   Qualifier: Diagnosis of  By: Asa Lente MD, Jannifer Rodney   . Otitis, externa, infective 05/24/2016  . PERSONAL HX COLONIC POLYPS 04/21/2009   Qualifier: Diagnosis of  By: Trellis Paganini PA-c, Amy S   . SINUSITIS 01/14/2010   Qualifier: Diagnosis of  By: Asa Lente MD, Jannifer Rodney Upper respiratory tract infection 09/23/2016  . UTI 08/19/2010   Qualifier: Diagnosis of  By: Marca Ancona RMA, Lucy    . Wheezing 09/10/2017   Past Surgical History:  Procedure Laterality Date  . BREAST BIOPSY Right    [place]   . COLONOSCOPY    . MASTECTOMY Right   . PORT-A-CATH REMOVAL Left 05/13/2015   Procedure: REMOVAL PORT-A-CATH;  Surgeon: [name], MD;  Location: [facility];  Service: General;  Laterality: Left;  . PORTACATH PLACEMENT Left 11/06/2014   Procedure: INSERTION PORT-A-CATH;  Surgeon:  [name], MD;  Location: [facility];  Service: General;  Laterality: Left;  . SIMPLE MASTECTOMY WITH AXILLARY SENTINEL NODE BIOPSY Right 05/13/2015   Procedure: RIGHT TOTAL  MASTECTOMY WITH RIGHT  AXILLARY SENTINEL NODE BIOPSY;  Surgeon: [name], MD;  Location: [facility];  Service: General;  Laterality: Right;  . TUBAL LIGATION     Social History   Socioeconomic History  . Marital status: Single    Spouse name: Not on file  . Number of children: 2  . Years of education: 108   . Highest education level: Not on file  Occupational History  . Occupation: Solicitor: [employer]  Tobacco Use  . Smoking status: Never Smoker  . Smokeless tobacco: Never Used  Substance and Sexual Activity  . Alcohol use: [name]    Alcohol/week: 0.0 standard drinks  . Drug use: [name]  . Sexual activity: Not on file  Other Topics Concern  . 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    Patient is single and lives alone.    Patient is right handed.    Social Determinants of Health   Financial Resource Strain:   . Difficulty of Paying Living Expenses: Not on file  Food Insecurity:   . Worried About Charity fundraiser in the Last Year: Not on file  . Ran Out of Food in the Last Year: Not on file  Transportation Needs:   . Lack of Transportation (Medical): Not on file  . Lack of Transportation (Non-Medical): Not on file  Physical Activity:   . Days of Exercise per Week: Not on file  . Minutes of Exercise per Session: Not on file  Stress:   . Feeling of Stress : Not on file  Social Connections:   . Frequency of Communication with Friends and Family: Not on file  . Frequency of Social Gatherings with Friends and Family: Not on file  . Attends Religious Services: Not on file  . Active Member of Clubs or Organizations: Not on file  . Attends Archivist Meetings: Not on file  . Marital Status: Not on file   Allergies  Allergen Reactions  . Ace Inhibitors Anaphylaxis     Angioedema (tongue swelling)  . Clarithromycin Anaphylaxis    Throat swells  . Contrave [Naltrexone-Bupropion Hcl Er]     Chest and sob  . Levaquin [Levofloxacin] Other (See Comments)    Tendon pain - shoulder and calf   Family History  Problem Relation Age of Onset  . Coronary artery disease Mother   . Heart disease Mother   . Diabetes Mother   . Hypertension Mother   .  Stroke Mother   . Alcohol abuse Father   . COPD Maternal Grandmother   . Hypertension Other   . Hyperlipidemia Other   . Colon cancer Neg Hx   . Rectal cancer Neg Hx   . Stomach cancer Neg Hx   . Esophageal cancer Neg Hx      Current Outpatient Medications (Cardiovascular):  .  hydrochlorothiazide (HYDRODIURIL) 25 MG tablet, Take 1 tablet (25 mg total) by mouth daily. [name]  losartan (COZAAR) 50 MG tablet, Take 1 tablet (50 mg total) by mouth daily. .  metoprolol tartrate (LOPRESSOR) 50 MG tablet, Take 1 tablet (50 mg total) by mouth 2 (two) times daily.  Current Outpatient Medications (Respiratory):  .  azelastine (ASTELIN) 0.1 % nasal spray, azelastine 137 mcg (0.1 %) nasal spray aerosol .  fluticasone (FLONASE) 50 MCG/ACT nasal spray, Place 2 sprays into both nostrils daily. [name]  loratadine (CLARITIN) 10 MG tablet, Take 10 mg by mouth daily. .  SYMBICORT 160-4.5 MCG/ACT inhaler, INHALE 2 PUFFS INTO THE LUNGS TWICE DAILY (Patient taking differently: Takes as needed)   Current Outpatient Medications (Hematological):  .  ferrous gluconate (FERGON) 324 MG tablet, Take 1 tablet (324 mg total) by mouth 2 (two) times daily with a meal. (Patient taking differently: Take 324 mg by mouth 3 (three) times a week. ) .  rivaroxaban (XARELTO) 20 MG TABS tablet, Take 1 tablet (20 mg total) by mouth daily with supper.  Current Outpatient Medications (Other):  .  b complex vitamins tablet, Take 1 tablet by mouth daily. .  ergocalciferol (VITAMIN D2) 1.25 MG (50000 UT) capsule, ergocalciferol (vitamin D2) 1,250 mcg (50,000 unit) capsule .  gabapentin (NEURONTIN) 100 MG capsule, Take 3 capsules (300 mg total) by mouth 3 (three) times daily. [name]  glatiramer (COPAXONE) 20 MG/ML SOSY injection, Copaxone 20 mg/mL subcutaneous syringe .  linaclotide (LINZESS) 145 MCG CAPS capsule, Take 2 capsules (290 mcg total) by mouth daily. .  pantoprazole (PROTONIX) 40 MG tablet, TAKE 1 TABLET BY MOUTH EVERY DAY (Patient  taking differently: 3 times per week) .  potassium chloride SA (KLOR-CON) 20 MEQ tablet, Take 2 (20 meq) tablets 40 meq in the AM and 1 (20 meq) tablet 20 meq in the PM .  Probiotic Product (PROBIOTIC PO), Take 2 capsules by mouth daily. .  traZODone (DESYREL) 50 MG tablet, Take 1-2 tablets (50-100 mg total) by mouth at bedtime as needed for sleep. .  TURMERIC PO, Take by mouth daily. [name]  VITAMIN D PO, Take 2,000 Units by mouth daily.    Past medical history, social, surgical and family history all reviewed in electronic medical record.  [name] pertanent information unless stated regarding to the chief complaint.   Review of Systems:  [name] headache, visual changes, nausea, vomiting, diarrhea, constipation, dizziness, abdominal pain, skin rash, fevers, chills, night sweats, weight loss, swollen lymph nodes, body aches, joint swelling, chest pain, shortness of breath, mood changes. POSITIVE muscle aches  Objective  Blood pressure 124/80, pulse 90, height 5\' 4"  (1.626 m), weight 217 lb (98.4 kg), last menstrual period 11/01/2012, SpO2 99 %.   General: [name] apparent distress alert and oriented x3 mood and affect normal, dressed appropriately.  HEENT: Pupils equal, extraocular movements intact  Respiratory: Patient's speak in full sentences and does not appear short of breath  Cardiovascular: [name] lower extremity edema, non tender, [name] erythema  Skin: Warm dry intact with [name] signs of infection or rash on extremities or on axial skeleton.  Abdomen: Soft nontender  Neuro: Cranial nerves II through XII are intact, neurovascularly intact in all extremities with 2+ DTRs and 2+ pulses.  Lymph: [name] lymphadenopathy of posterior or anterior cervical chain or axillae bilaterally.  Gait normal with good balance and coordination.  MSK:  tender with full range of motion and good stability and symmetric strength and tone of shoulders, elbows, wrist, hip, knee and ankles bilaterally.  Right shoulder exam shows the patient  does have limited active range of motion but nearly full passive range of motion positive crossover, positive Hawkins.  Rotator cuff strength 4 out of 5 compared to the contralateral side  Limited musculoskeletal ultrasound was performed interpreted by Lyndal Pulley  Limited musculoskeletal ultrasound shows the  patient did have a fairly large subacromial bursitis noted.  Rotator cuff possible small degenerative tearing noted of the supraspinatus.  [name] retraction.  Patient does have moderate hypoechoic changes with positive motion sign of the acromioclavicular joint with significant narrowing. Impression: Subacromial bursitis, moderate arthritis,  Procedure: Real-time Ultrasound Guided Injection of right glenohumeral joint Device: GE Logiq Q7  Ultrasound guided injection is preferred based studies that show increased duration, increased effect, greater accuracy, decreased procedural pain, increased response rate with ultrasound guided versus blind injection.  Verbal informed consent obtained.  Time-out conducted.  Noted [name] overlying erythema, induration, or other signs of local infection.  Skin prepped in a sterile fashion.  Local anesthesia: Topical Ethyl chloride.  With sterile technique and under real time ultrasound guidance:  Joint visualized.  23g 1  inch needle inserted posterior approach. Pictures taken for needle placement. Patient did have injection of 2 cc of 1% lidocaine, 2 cc of 0.5% Marcaine, and 1.0 cc of Kenalog 40 mg/dL. Completed without difficulty  Pain immediately resolved suggesting accurate placement of the medication.  Advised to call if fevers/chills, erythema, induration, drainage, or persistent bleeding.  Images permanently stored and available for review in the ultrasound unit.  Impression: Technically successful ultrasound guided injection.  Procedure: Real-time Ultrasound Guided Injection of right acromioclavicular joint Device: GE Logiq Q7 Ultrasound guided  injection is preferred based studies that show increased duration, increased effect, greater accuracy, decreased procedural pain, increased response rate, and decreased cost with ultrasound guided versus blind injection.  Verbal informed consent obtained.  Time-out conducted.  Noted [name] overlying erythema, induration, or other signs of local infection.  Skin prepped in a sterile fashion.  Local anesthesia: Topical Ethyl chloride.  With sterile technique and under real time ultrasound guidance: With a 25-gauge half inch needle injected with 0.5 cc of 0.5% Marcaine and 0.5 cc of Kenalog 40 mg/mL Completed without difficulty  Pain immediately resolved suggesting accurate placement of the medication.  Advised to call if fevers/chills, erythema, induration, drainage, or persistent bleeding.  Images permanently stored and available for review in the ultrasound unit.  Impression: Technically successful ultrasound guided injection.   Impression and Recommendations:     This case required medical decision making of moderate complexity. The above documentation has been reviewed and is accurate and complete Lyndal Pulley, DO       Note: This dictation was prepared with Dragon dictation along with smaller phrase technology. Any transcriptional errors that result from this process are unintentional.

## 2019-10-09 NOTE — Patient Instructions (Signed)
Start exercises on Monday Keep hands in peripheral vision See me in 6 weeks if not better

## 2019-10-09 NOTE — Assessment & Plan Note (Signed)
Patient given injection, tolerated the procedure well, discussed icing regimen and home exercise, patient is to increase activity slowly.  Follow-up again 6 weeks

## 2019-10-09 NOTE — Assessment & Plan Note (Signed)
Given injection.  Seem to have more of a bursitis as well as acromioclavicular arthritis.  Patient was given injection in the acromioclavicular joint as well as the glenohumeral joint.  We discussed with patient to avoid any needle sticks such as in the vascular system.  Patient will try these home exercises and topical anti-inflammatories and see me again in 6 weeks

## 2019-10-10 ENCOUNTER — Telehealth: Payer: Self-pay | Admitting: Family Medicine

## 2019-10-10 ENCOUNTER — Other Ambulatory Visit: Payer: Self-pay

## 2019-10-10 MED ORDER — DICLOFENAC SODIUM 2 % EX SOLN: 2.0000 g | Freq: Two times a day (BID) | CUTANEOUS | 3 refills | Status: DC

## 2019-10-10 NOTE — Telephone Encounter (Signed)
Patient would like a prescription for Pensaid to be sent to [facility] (if possible). She was given samples yesterday and would like to get a prescription of it.

## 2019-10-10 NOTE — Telephone Encounter (Signed)
Patient notified of Pennsaid script called in.

## 2019-10-21 ENCOUNTER — Telehealth: Payer: Self-pay | Admitting: *Deleted

## 2019-10-21 DIAGNOSIS — C50411 Malignant neoplasm of upper-outer quadrant of right female breast: Secondary | ICD-10-CM

## 2019-10-21 NOTE — Telephone Encounter (Signed)
Received call from patient stating she wanted to go ahead and schedule her yearly appointment.  Confirmed appointment for 7/8 at 830 lab and 9 with Dr. Jana Hakim.

## 2019-10-22 ENCOUNTER — Other Ambulatory Visit: Payer: Self-pay | Admitting: Neurology

## 2019-11-03 ENCOUNTER — Ambulatory Visit
Admission: RE | Admit: 2019-11-03 | Discharge: 2019-11-03 | Disposition: A | Discharge status: Home / Self Care | Payer: BC Managed Care – PPO | Source: Ambulatory Visit | Attending: Internal Medicine | Admitting: Internal Medicine

## 2019-11-03 ENCOUNTER — Other Ambulatory Visit: Payer: Self-pay

## 2019-11-03 DIAGNOSIS — Z1231 Encounter for screening mammogram for malignant neoplasm of breast: Secondary | ICD-10-CM

## 2019-11-12 ENCOUNTER — Telehealth: Payer: Self-pay

## 2019-11-12 ENCOUNTER — Other Ambulatory Visit: Payer: Self-pay

## 2019-11-12 NOTE — Telephone Encounter (Signed)
They are likely giving her a generic - she should ask the pharmacist if there is a smaller pill - we may be able to adjust her pill dose to a smaller pill and have her take more pills depending on what the pharmacy has.  Some generic pill are larger than others.

## 2019-11-12 NOTE — Telephone Encounter (Signed)
Pt is asking if there is another potassium pill that can be sent in to replace the tablet she is taking. She is having hard time swallowing the pills due to the size. Please advise on alternative.

## 2019-11-13 ENCOUNTER — Other Ambulatory Visit: Payer: Self-pay

## 2019-11-13 ENCOUNTER — Ambulatory Visit: Payer: BC Managed Care – PPO | Attending: Family

## 2019-11-13 DIAGNOSIS — Z23 Encounter for immunization: Secondary | ICD-10-CM

## 2019-11-13 NOTE — Telephone Encounter (Signed)
Sent mychart message

## 2019-11-13 NOTE — Progress Notes (Signed)
   Covid-19 Vaccination Clinic  Name:  Millianna Mckerrow    MRN: [id number] DOB: [birth year]  11/13/2019  Ms. Kemerling was observed post Covid-19 immunization for 15 minutes without incidence. She was provided with Vaccine Information Sheet and instruction to access the V-Safe system.   Ms. Ravencraft was instructed to call 911 with any severe reactions post vaccine: [name] Difficulty breathing  . Swelling of your face and throat  . A fast heartbeat  . A bad rash all over your body  . Dizziness and weakness    Immunizations Administered    Name Date Dose VIS Date Route   Moderna COVID-19 Vaccine 11/13/2019  4:37 PM 0.5 mL 08/19/2019 Intramuscular   Manufacturer: Moderna   LotBF:[id number]   [id number]

## 2019-11-15 MED ORDER — POTASSIUM CHLORIDE ER 10 MEQ PO TBCR: 40.0000 meq | EXTENDED_RELEASE_TABLET | Freq: Every day | ORAL | 5 refills | Status: DC

## 2019-11-21 ENCOUNTER — Ambulatory Visit: Payer: BC Managed Care – PPO | Admitting: Family Medicine

## 2019-11-21 ENCOUNTER — Encounter: Payer: Self-pay | Admitting: Family Medicine

## 2019-11-21 ENCOUNTER — Other Ambulatory Visit: Payer: Self-pay

## 2019-11-21 VITALS — BP 112/82 | HR 62 | Ht 64.0 in | Wt 217.0 lb

## 2019-11-21 DIAGNOSIS — M19011 Primary osteoarthritis, right shoulder: Secondary | ICD-10-CM | POA: Diagnosis not present

## 2019-11-21 DIAGNOSIS — M25511 Pain in right shoulder: Secondary | ICD-10-CM | POA: Diagnosis not present

## 2019-11-21 NOTE — Progress Notes (Signed)
[place] Phone: [phone] Subjective:   [name], am serving as a scribe for Dr. Hulan Saas. This visit occurred during the SARS-CoV-2 public health emergency.  Safety protocols were in place, including screening questions prior to the visit, additional usage of staff PPE, and extensive cleaning of exam room while observing appropriate contact time as indicated for disinfecting solutions.   I'm seeing this patient by the request  of:  Binnie Rail, MD  CC: Shoulder pain follow-up  [id number]   10/09/2019 Given injection.  Seem to have more of a bursitis as well as acromioclavicular arthritis.  Patient was given injection in the acromioclavicular joint as well as the glenohumeral joint.  We discussed with patient to avoid any needle sticks such as in the vascular system.  Patient will try these home exercises and topical anti-inflammatories and see me again in 6 weeks  Update 11/21/2019 [name] [name] is a 59 y.o. female coming in with complaint of right shoulder. Does feel 97% better. No pattern to the pain that she still has. Does still have achy pain.  Patient would state 98% better at this time.  Still some mild popping but no pain.  Decreased exercises as well as topical anti-inflammatories.  Still doing some of the vitamin supplementation.       Past Medical History:  Diagnosis Date  . Acute non-recurrent maxillary sinusitis 01/08/2017  . Allergic rhinitis due to other allergen   . Allergy   . Angioedema    ACEI  . 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 Anxiety   . Breast cancer of upper-outer quadrant of right female breast ([place]) 10/23/2014   s/p r mastectomy, neoadj chemo completed 04/21/15; neg genetic testing  . 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  . CHEST PAIN-UNSPECIFIED 05/19/2010   Qualifier: Diagnosis of  By: Aundra Dubin, MD, [place]    . Chronic fatigue  06/08/2016  . Colon polyps 05/2014   adenomatous, colo q 5y  . Conjunctiva disorder 08/06/2018  . CONSTIPATION, CHRONIC   . Cough 04/25/2016  . Depression 09/03/2016  . Ear canal dryness 01/23/2018  . FATIGUE 06/24/2008   Qualifier: Diagnosis of  By: Wynona Luna   . GERD   . Herpes zoster without complication [id number]  . Hot flashes   . HYPERLIPIDEMIA   . HYPERTENSION   . Hypokalemia 12/10/2014  . Lactose intolerance   . LRTI (lower respiratory tract infection) 09/05/2017  . LUQ cramping 04/21/2017  . Migraine headache   . MIGRAINES, HX OF 09/18/2007   Qualifier: Diagnosis of  By: Danelle Earthly CMA, Darlene    . MS (multiple sclerosis) ([place]) 11/03/2015  . Multiple sclerosis ([place])   . Muscle strain 10/21/2016  . OBESITY, TRUNCAL   . OTHER SYMPTOMS INVOLVING DIGESTIVE SYSTEM OTHER 12/14/2009   Qualifier: Diagnosis of  By: Asa Lente MD, Jannifer Rodney   . Otitis, externa, infective 05/24/2016  . PERSONAL HX COLONIC POLYPS 04/21/2009   Qualifier: Diagnosis of  By: Trellis Paganini PA-c, Amy S   . SINUSITIS 01/14/2010   Qualifier: Diagnosis of  By: Asa Lente MD, Jannifer Rodney Upper respiratory tract infection 09/23/2016  . UTI 08/19/2010   Qualifier: Diagnosis of  By: Marca Ancona RMA, Lucy    . Wheezing 09/10/2017   Past Surgical History:  Procedure Laterality Date  . BREAST BIOPSY Right    [place]   . COLONOSCOPY    . MASTECTOMY Right   . PORT-A-CATH REMOVAL Left  05/13/2015   Procedure: REMOVAL PORT-A-CATH;  Surgeon: [name], MD;  Location: [facility];  Service: General;  Laterality: Left;  . PORTACATH PLACEMENT Left 11/06/2014   Procedure: INSERTION PORT-A-CATH;  Surgeon: [name], MD;  Location: [facility];  Service: General;  Laterality: Left;  . SIMPLE MASTECTOMY WITH AXILLARY SENTINEL NODE BIOPSY Right 05/13/2015   Procedure: RIGHT TOTAL  MASTECTOMY WITH RIGHT  AXILLARY SENTINEL NODE BIOPSY;  Surgeon: [name], MD;  Location: [facility];  Service: General;  Laterality: Right;  .  TUBAL LIGATION     Social History   Socioeconomic History  . Marital status: Single    Spouse name: Not on file  . Number of children: 2  . Years of education: 50  . Highest education level: Not on file  Occupational History  . Occupation: Solicitor: [employer]  Tobacco Use  . Smoking status: Never Smoker  . Smokeless tobacco: Never Used  Substance and Sexual Activity  . Alcohol use: No    Alcohol/week: 0.0 standard drinks  . Drug use: No  . Sexual activity: Not on file  Other Topics Concern  . 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    Patient is single and lives alone.    Patient is right handed.    Social Determinants of Health   Financial Resource Strain:   . Difficulty of Paying Living Expenses: Not on file  Food Insecurity:   . Worried About Charity fundraiser in the Last Year: Not on file  . Ran Out of Food in the Last Year: Not on file  Transportation Needs:   . Lack of Transportation (Medical): Not on file  . Lack of Transportation (Non-Medical): Not on file  Physical Activity:   . Days of Exercise per Week: Not on file  . Minutes of Exercise per Session: Not on file  Stress:   . Feeling of Stress : Not on file  Social Connections:   . Frequency of Communication with Friends and Family: Not on file  . Frequency of Social Gatherings with Friends and Family: Not on file  . Attends Religious Services: Not on file  . Active Member of Clubs or Organizations: Not on file  . Attends Archivist Meetings: Not on file  . Marital Status: Not on file   Allergies  Allergen Reactions  . Ace Inhibitors Anaphylaxis     Angioedema (tongue swelling)  . Clarithromycin Anaphylaxis    Throat swells  . Contrave [Naltrexone-Bupropion Hcl Er]     Chest and sob  . Levaquin [Levofloxacin] Other (See Comments)    Tendon pain - shoulder and  calf   Family History  Problem Relation Age of Onset  . Coronary artery disease Mother   . Heart disease Mother   . Diabetes Mother   . Hypertension Mother   . Stroke Mother   . Alcohol abuse Father   . COPD Maternal Grandmother   . Hypertension Other   . Hyperlipidemia Other   . Colon cancer Neg Hx   . Rectal cancer Neg Hx   . Stomach cancer Neg Hx   . Esophageal cancer Neg Hx      Current Outpatient Medications (Cardiovascular):  .  hydrochlorothiazide (HYDRODIURIL) 25 MG tablet, Take 1 tablet (25 mg total) by mouth daily. [name]  losartan (COZAAR) 50 MG tablet, Take 1 tablet (  50 mg total) by mouth daily. .  metoprolol tartrate (LOPRESSOR) 50 MG tablet, Take 1 tablet (50 mg total) by mouth 2 (two) times daily.  Current Outpatient Medications (Respiratory):  .  azelastine (ASTELIN) 0.1 % nasal spray, azelastine 137 mcg (0.1 %) nasal spray aerosol .  fluticasone (FLONASE) 50 MCG/ACT nasal spray, Place 2 sprays into both nostrils daily. [name]  loratadine (CLARITIN) 10 MG tablet, Take 10 mg by mouth daily. .  SYMBICORT 160-4.5 MCG/ACT inhaler, INHALE 2 PUFFS INTO THE LUNGS TWICE DAILY (Patient taking differently: Takes as needed)   Current Outpatient Medications (Hematological):  .  ferrous gluconate (FERGON) 324 MG tablet, Take 1 tablet (324 mg total) by mouth 2 (two) times daily with a meal. (Patient taking differently: Take 324 mg by mouth 3 (three) times a week. ) .  rivaroxaban (XARELTO) 20 MG TABS tablet, Take 1 tablet (20 mg total) by mouth daily with supper.  Current Outpatient Medications (Other):  .  b complex vitamins tablet, Take 1 tablet by mouth daily. .  Diclofenac Sodium 2 % SOLN, Place 2 g onto the skin 2 (two) times daily. .  ergocalciferol (VITAMIN D2) 1.25 MG (50000 UT) capsule, ergocalciferol (vitamin D2) 1,250 mcg (50,000 unit) capsule .  gabapentin (NEURONTIN) 100 MG capsule, Take 3 capsules (300 mg total) by mouth 3 (three) times daily. [name]  glatiramer (COPAXONE)  20 MG/ML SOSY injection, Copaxone 20 mg/mL subcutaneous syringe .  linaclotide (LINZESS) 145 MCG CAPS capsule, Take 2 capsules (290 mcg total) by mouth daily. .  pantoprazole (PROTONIX) 40 MG tablet, TAKE 1 TABLET BY MOUTH EVERY DAY (Patient taking differently: 3 times per week) .  potassium chloride (KLOR-CON 10) 10 MEQ tablet, Take 4 tablets (40 mEq total) by mouth daily. .  potassium chloride SA (KLOR-CON) 20 MEQ tablet, Take 2 (20 meq) tablets 40 meq in the AM and 1 (20 meq) tablet 20 meq in the PM .  Probiotic Product (PROBIOTIC PO), Take 2 capsules by mouth daily. .  traZODone (DESYREL) 50 MG tablet, Take 1-2 tablets (50-100 mg total) by mouth at bedtime as needed for sleep. .  TURMERIC PO, Take by mouth daily. [name]  VITAMIN D PO, Take 2,000 Units by mouth daily.   Reviewed prior external information including notes and imaging from  primary care provider As well as notes that were available from care everywhere and other healthcare systems.  Past medical history, social, surgical and family history all reviewed in electronic medical record.  No pertanent information unless stated regarding to the chief complaint.   Review of Systems:  No headache, visual changes, nausea, vomiting, diarrhea, constipation, dizziness, abdominal pain, skin rash, fevers, chills, night sweats, weight loss, swollen lymph nodes, body aches, joint swelling, chest pain, shortness of breath, mood changes. POSITIVE muscle aches  Objective  Blood pressure 112/82, pulse 62, height 5\' 4"  (1.626 m), weight 217 lb (98.4 kg), last menstrual period 11/01/2012, SpO2 99 %.   General: No apparent distress alert and oriented x3 mood and affect normal, dressed appropriately.  HEENT: Pupils equal, extraocular movements intact  Respiratory: Patient's speak in full sentences and does not appear short of breath  Cardiovascular: No lower extremity edema, non tender, no erythema  Skin: Warm dry intact with no signs of infection or  rash on extremities or on axial skeleton.  Abdomen: Soft nontender  Neuro: Cranial nerves II through XII are intact, neurovascularly intact in all extremities with 2+ DTRs and 2+ pulses.  Lymph: No lymphadenopathy of posterior  or anterior cervical chain or axillae bilaterally.  Gait normal with good balance and coordination.  MSK: Right shoulder exam shows that patient does have very mild impingement still remaining.  Nontender with speeds.  Full range of motion.  Negative crossover.  Nontender on palpation.    Impression and Recommendations:      The above documentation has been reviewed and is accurate and complete Lyndal Pulley, DO       Note: This dictation was prepared with Dragon dictation along with smaller phrase technology. Any transcriptional errors that result from this process are unintentional.

## 2019-11-21 NOTE — Patient Instructions (Signed)
[place] at night for next 2 weeks Exercises 3x a week See me when you need me

## 2019-11-21 NOTE — Assessment & Plan Note (Signed)
Significant improvement at this time.  Likely some residual bursitis still remaining.  Gust with patient about topical anti-inflammatories and exercises on a regular routine.  As long as patient does well can follow-up with me as needed

## 2019-11-25 ENCOUNTER — Other Ambulatory Visit: Payer: Self-pay | Admitting: Internal Medicine

## 2019-12-01 ENCOUNTER — Other Ambulatory Visit: Payer: Self-pay | Admitting: Neurology

## 2019-12-07 NOTE — Patient Instructions (Addendum)
Blood work was ordered.    All other Health Maintenance issues reviewed.   All recommended immunizations and age-appropriate screenings are up-to-date or discussed.  No immunization administered today.   Medications reviewed and updated.  Changes include :   none  Your prescription(s) have been submitted to your pharmacy. Please take as directed and contact our office if you believe you are having problem(s) with the medication(s).    Please followup in 6 months    Health Maintenance, Female Adopting a healthy lifestyle and getting preventive care are important in promoting health and wellness. Ask your health care provider about:  The right schedule for you to have regular tests and exams.  Things you can do on your own to prevent diseases and keep yourself healthy. What should I know about diet, weight, and exercise? Eat a healthy diet   Eat a diet that includes plenty of vegetables, fruits, low-fat dairy products, and lean protein.  Do not eat a lot of foods that are high in solid fats, added sugars, or sodium. Maintain a healthy weight Body mass index (BMI) is used to identify weight problems. It estimates body fat based on height and weight. Your health care provider can help determine your BMI and help you achieve or maintain a healthy weight. Get regular exercise Get regular exercise. This is one of the most important things you can do for your health. Most adults should:  Exercise for at least 150 minutes each week. The exercise should increase your heart rate and make you sweat (moderate-intensity exercise).  Do strengthening exercises at least twice a week. This is in addition to the moderate-intensity exercise.  Spend less time sitting. Even light physical activity can be beneficial. Watch cholesterol and blood lipids Have your blood tested for lipids and cholesterol at 58 years of age, then have this test every 5 years. Have your cholesterol levels checked more  often if:  Your lipid or cholesterol levels are high.  You are older than 58 years of age.  You are at high risk for heart disease. What should I know about cancer screening? Depending on your health history and family history, you may need to have cancer screening at various ages. This may include screening for:  Breast cancer.  Cervical cancer.  Colorectal cancer.  Skin cancer.  Lung cancer. What should I know about heart disease, diabetes, and high blood pressure? Blood pressure and heart disease  High blood pressure causes heart disease and increases the risk of stroke. This is more likely to develop in people who have high blood pressure readings, are of African descent, or are overweight.  Have your blood pressure checked: ? Every 3-5 years if you are 18-39 years of age. ? Every year if you are 40 years old or older. Diabetes Have regular diabetes screenings. This checks your fasting blood sugar level. Have the screening done:  Once every three years after age 40 if you are at a normal weight and have a low risk for diabetes.  More often and at a younger age if you are overweight or have a high risk for diabetes. What should I know about preventing infection? Hepatitis B If you have a higher risk for hepatitis B, you should be screened for this virus. Talk with your health care provider to find out if you are at risk for hepatitis B infection. Hepatitis C Testing is recommended for:  Everyone born from 1945 through 1965.  Anyone with known risk factors for hepatitis   C. Sexually transmitted infections (STIs)  Get screened for STIs, including gonorrhea and chlamydia, if: ? You are sexually active and are younger than 58 years of age. ? You are older than 58 years of age and your health care provider tells you that you are at risk for this type of infection. ? Your sexual activity has changed since you were last screened, and you are at increased risk for chlamydia  or gonorrhea. Ask your health care provider if you are at risk.  Ask your health care provider about whether you are at high risk for HIV. Your health care provider may recommend a prescription medicine to help prevent HIV infection. If you choose to take medicine to prevent HIV, you should first get tested for HIV. You should then be tested every 3 months for as long as you are taking the medicine. Pregnancy  If you are about to stop having your period (premenopausal) and you may become pregnant, seek counseling before you get pregnant.  Take 400 to 800 micrograms (mcg) of folic acid every day if you become pregnant.  Ask for birth control (contraception) if you want to prevent pregnancy. Osteoporosis and menopause Osteoporosis is a disease in which the bones lose minerals and strength with aging. This can result in bone fractures. If you are 65 years old or older, or if you are at risk for osteoporosis and fractures, ask your health care provider if you should:  Be screened for bone loss.  Take a calcium or vitamin D supplement to lower your risk of fractures.  Be given hormone replacement therapy (HRT) to treat symptoms of menopause. Follow these instructions at home: Lifestyle  Do not use any products that contain nicotine or tobacco, such as cigarettes, e-cigarettes, and chewing tobacco. If you need help quitting, ask your health care provider.  Do not use street drugs.  Do not share needles.  Ask your health care provider for help if you need support or information about quitting drugs. Alcohol use  Do not drink alcohol if: ? Your health care provider tells you not to drink. ? You are pregnant, may be pregnant, or are planning to become pregnant.  If you drink alcohol: ? Limit how much you use to 0-1 drink a day. ? Limit intake if you are breastfeeding.  Be aware of how much alcohol is in your drink. In the [place] one drink equals one 12 oz bottle of beer (355 mL), one 5 oz  glass of wine (148 mL), or one 1 oz glass of hard liquor (44 mL). General instructions  Schedule regular health, dental, and eye exams.  Stay current with your vaccines.  Tell your health care provider if: ? You often feel depressed. ? You have ever been abused or do not feel safe at home. Summary  Adopting a healthy lifestyle and getting preventive care are important in promoting health and wellness.  Follow your health care provider's instructions about healthy diet, exercising, and getting tested or screened for diseases.  Follow your health care provider's instructions on monitoring your cholesterol and blood pressure. This information is not intended to replace advice given to you by your health care provider. Make sure you discuss any questions you have with your health care provider. Document Revised: 08/28/2018 Document Reviewed: 08/28/2018 Elsevier Patient Education  2020 [employer]  

## 2019-12-07 NOTE — Progress Notes (Signed)
Subjective:    Patient ID: [name] [name], female    DOB: 22-Jul-1961, 59 y.o.   MRN: [id number]  HPI She is here for a physical exam.   She denies concerns.   Medications and allergies reviewed with patient and updated if appropriate.  Patient Active Problem List   Diagnosis Date Noted  . AC (acromioclavicular) arthritis 10/09/2019  . Acute pain of right shoulder 09/04/2019  . Acute sinus infection 08/19/2019  . Atrial flutter ([place]) 02/28/2019  . RLS (restless legs syndrome) 11/21/2018  . Sleep difficulties 08/06/2018  . Trigger finger 01/23/2018  . Meralgia paraesthetica, right 11/05/2017  . Iron deficiency 10/09/2017  . Vitamin D deficiency 06/15/2017  . Lumbar radiculopathy 04/21/2017  . Trigger index finger of left hand 04/21/2017  . Obesity (BMI 35.0-39.9 without comorbidity) 04/21/2017  . Left hip pain 10/31/2016  . Pes planus 10/31/2016  . Posterior tibialis muscle dysfunction 10/31/2016  . Memory difficulties 09/03/2016  . Breast pain, right 08/24/2016  . Palpitation 04/04/2016  . 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 Malignant neoplasm of upper-outer quadrant of right breast in female, 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[place]) 11/03/2015  . S/P mastectomy 05/13/2015  . Chemotherapy-induced neuropathy ([place]) 04/21/2015  . Genetic testing 12/11/2014  . Breast cancer of upper-outer quadrant of right female breast ([place]) 10/23/2014  . Anxiety 04/29/2012  . Allergic rhinitis 11/29/2009  . 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 CONSTIPATION, CHRONIC 08/17/2008  . Dyslipidemia 04/24/2008  . METABOLIC SYNDROME X 99991111  . GERD 01/07/2008  . Essential hypertension 09/20/2007  . Multiple sclerosis ([place]) 09/18/2007    Current Outpatient Medications on File Prior to Visit  Medication Sig Dispense Refill  . b complex vitamins tablet Take 1 tablet by mouth daily.    [name] COPAXONE 40 MG/ML SOSY INJECT ONE SYRINGE SUBCUTANEOUSLY THREE TIMES PER WEEK AT LEAST 48 HOURS APART. ALLOW SYRINGE TO WARM TO  ROOM TEMPERATURE FOR 20 MINUTES. REFRIGERATE. 36 mL 2  . Diclofenac Sodium 2 % SOLN Place 2 g onto the skin 2 (two) times daily. 112 g 3  . ergocalciferol (VITAMIN D2) 1.25 MG (50000 UT) capsule ergocalciferol (vitamin D2) 1,250 mcg (50,000 unit) capsule    . ferrous gluconate (FERGON) 324 MG tablet Take 1 tablet (324 mg total) by mouth 2 (two) times daily with a meal. (Patient taking differently: Take 324 mg by mouth 3 (three) times a week. ) 180 tablet 3  . fluticasone (FLONASE) 50 MCG/ACT nasal spray Place 2 sprays into both nostrils daily. 16 g 6  . gabapentin (NEURONTIN) 100 MG capsule Take 3 capsules (300 mg total) by mouth 3 (three) times daily. 810 capsule 1  . hydrochlorothiazide (HYDRODIURIL) 25 MG tablet Take 1 tablet (25 mg total) by mouth daily. 90 tablet 1  . linaclotide (LINZESS) 145 MCG CAPS capsule Take 2 capsules (290 mcg total) by mouth daily. 180 capsule 1  . loratadine (CLARITIN) 10 MG tablet Take 10 mg by mouth daily.    [name] losartan (COZAAR) 50 MG tablet Take 1 tablet (50 mg total) by mouth daily. 90 tablet 1  . metoprolol tartrate (LOPRESSOR) 50 MG tablet TAKE 1 TABLET(50 MG) BY MOUTH TWICE DAILY 180 tablet 1  . pantoprazole (PROTONIX) 40 MG tablet TAKE 1 TABLET BY MOUTH EVERY DAY (Patient taking differently: 3 times per week) 90 tablet 1  . potassium chloride (KLOR-CON 10) 10 MEQ tablet Take 4 tablets (40 mEq total) by mouth daily. 120 tablet 5  . Probiotic Product (PROBIOTIC PO) Take 2 capsules by mouth daily.    [name]  rivaroxaban (XARELTO) 20 MG TABS tablet Take 1 tablet (20 mg total) by mouth daily with supper. 90 tablet 1  . SYMBICORT 160-4.5 MCG/ACT inhaler INHALE 2 PUFFS INTO THE LUNGS TWICE DAILY (Patient taking differently: Takes as needed) 10.2 g 0  . traZODone (DESYREL) 50 MG tablet Take 1-2 tablets (50-100 mg total) by mouth at bedtime as needed for sleep. 60 tablet 3  . TURMERIC PO Take by mouth daily.    [name] VITAMIN D PO Take 2,000 Units by mouth daily.     No  current facility-administered medications on file prior to visit.    Past Medical History:  Diagnosis Date  . Acute non-recurrent maxillary sinusitis 01/08/2017  . Allergic rhinitis due to other allergen   . Allergy   . Angioedema    ACEI  . 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 Anxiety   . Breast cancer of upper-outer quadrant of right female breast ([place]) 10/23/2014   s/p r mastectomy, neoadj chemo completed 04/21/15; neg genetic testing  . 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  . CHEST PAIN-UNSPECIFIED 05/19/2010   Qualifier: Diagnosis of  By: Aundra Dubin, MD, [place]    . Chronic fatigue 06/08/2016  . Colon polyps 05/2014   adenomatous, colo q 5y  . Conjunctiva disorder 08/06/2018  . CONSTIPATION, CHRONIC   . Cough 04/25/2016  . Depression 09/03/2016  . Ear canal dryness 01/23/2018  . FATIGUE 06/24/2008   Qualifier: Diagnosis of  By: Wynona Luna   . GERD   . Herpes zoster without complication 99991111  . Hot flashes   . HYPERLIPIDEMIA   . HYPERTENSION   . Hypokalemia 12/10/2014  . Lactose intolerance   . LRTI (lower respiratory tract infection) 09/05/2017  . LUQ cramping 04/21/2017  . Migraine headache   . MIGRAINES, HX OF 09/18/2007   Qualifier: Diagnosis of  By: Danelle Earthly CMA, Darlene    . MS (multiple sclerosis) ([place]) 11/03/2015  . Multiple sclerosis ([place])   . Muscle strain 10/21/2016  . OBESITY, TRUNCAL   . OTHER SYMPTOMS INVOLVING DIGESTIVE SYSTEM OTHER 12/14/2009   Qualifier: Diagnosis of  By: Asa Lente MD, Jannifer Rodney   . Otitis, externa, infective 05/24/2016  . PERSONAL HX COLONIC POLYPS 04/21/2009   Qualifier: Diagnosis of  By: Trellis Paganini PA-c, Amy S   . SINUSITIS 01/14/2010   Qualifier: Diagnosis of  By: Asa Lente MD, Jannifer Rodney Upper respiratory tract infection 09/23/2016  . UTI 08/19/2010   Qualifier: Diagnosis of  By: Marca Ancona RMA, [place]    . Wheezing 09/10/2017    Past Surgical History:  Procedure Laterality Date  . BREAST BIOPSY Right    [place]   .  COLONOSCOPY    . MASTECTOMY Right   . PORT-A-CATH REMOVAL Left 05/13/2015   Procedure: REMOVAL PORT-A-CATH;  Surgeon: [name], MD;  Location: [facility];  Service: General;  Laterality: Left;  . PORTACATH PLACEMENT Left 11/06/2014   Procedure: INSERTION PORT-A-CATH;  Surgeon: [name], MD;  Location: [facility];  Service: General;  Laterality: Left;  . SIMPLE MASTECTOMY WITH AXILLARY SENTINEL NODE BIOPSY Right 05/13/2015   Procedure: RIGHT TOTAL  MASTECTOMY WITH RIGHT  AXILLARY SENTINEL NODE BIOPSY;  Surgeon: [name], MD;  Location: [facility];  Service: General;  Laterality: Right;  . TUBAL LIGATION      Social History   Socioeconomic History  . Marital status: Single    Spouse name: Not on file  . Number of children: 2  . Years of education: 52  .  Highest education level: Not on file  Occupational History  . Occupation: Solicitor: [employer]  Tobacco Use  . Smoking status: Never Smoker  . Smokeless tobacco: Never Used  Substance and Sexual Activity  . Alcohol use: No    Alcohol/week: 0.0 standard drinks  . Drug use: No  . Sexual activity: Not on file  Other Topics Concern  . 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    Patient is single and lives alone.    Patient is right handed.    Social Determinants of Health   Financial Resource Strain:   . Difficulty of Paying Living Expenses:   Food Insecurity:   . Worried About Charity fundraiser in the Last Year:   . Arboriculturist in the Last Year:   Transportation Needs:   . Film/video editor (Medical):   [name] Lack of Transportation (Non-Medical):   Physical Activity:   . Days of Exercise per Week:   . Minutes of Exercise per Session:   Stress:   . Feeling of Stress :   Social Connections:   . Frequency of Communication with Friends and Family:   . Frequency  of Social Gatherings with Friends and Family:   . Attends Religious Services:   . Active Member of Clubs or Organizations:   . Attends Archivist Meetings:   [name] Marital Status:     Family History  Problem Relation Age of Onset  . Coronary artery disease Mother   . Heart disease Mother   . Diabetes Mother   . Hypertension Mother   . Stroke Mother   . Alcohol abuse Father   . COPD Maternal Grandmother   . Hypertension Other   . Hyperlipidemia Other   . Colon cancer Neg Hx   . Rectal cancer Neg Hx   . Stomach cancer Neg Hx   . Esophageal cancer Neg Hx     Review of Systems  Constitutional: Negative for chills, fatigue and fever.  Eyes: Negative for visual disturbance.  Respiratory: Positive for wheezing. Negative for cough and shortness of breath.   Cardiovascular: Negative for chest pain, palpitations and leg swelling.  Gastrointestinal: Positive for constipation (linzess covered). Negative for abdominal pain, blood in stool, diarrhea and nausea.       GERD controlled  Genitourinary: Negative for dysuria and hematuria.  Musculoskeletal: Negative for arthralgias and back pain.  Skin: Negative for color change.  Neurological: Positive for dizziness. Negative for light-headedness and headaches.  Psychiatric/Behavioral: Positive for sleep disturbance. Negative for dysphoric mood. The patient is not nervous/anxious.        Objective:   Vitals:   12/08/19 1440  BP: 136/74  Pulse: 95  Resp: 16  Temp: 97.9 F (36.6 C)  SpO2: 99%   Filed Weights   12/08/19 1440  Weight: 220 lb (99.8 kg)   Body mass index is 37.76 kg/m.  BP Readings from Last 3 Encounters:  12/08/19 136/74  11/21/19 112/82  10/09/19 124/80    Wt Readings from Last 3 Encounters:  12/08/19 220 lb (99.8 kg)  11/21/19 217 lb (98.4 kg)  10/09/19 217 lb (98.4 kg)     Physical Exam Constitutional: She appears well-developed and well-nourished. No distress.  HENT:  Head: Normocephalic and  atraumatic.  Right Ear: External ear normal. Normal ear canal and TM Left Ear: External ear  normal.  Normal ear canal and TM Mouth/Throat: Oropharynx is clear and moist.  Eyes: Conjunctivae and EOM are normal.  Neck: Neck supple. No tracheal deviation present. No thyromegaly present.  No carotid bruit  Cardiovascular: Normal rate, regular rhythm and normal heart sounds.   No murmur heard.  No edema. Pulmonary/Chest: Effort normal and breath sounds normal. No respiratory distress. She has no wheezes. She has no rales.  Breast: deferred   Abdominal: Soft. She exhibits no distension. There is no tenderness.  Lymphadenopathy: She has no cervical adenopathy.  Skin: Skin is warm and dry. She is not diaphoretic.  Psychiatric: She has a normal mood and affect. Her behavior is normal.        Assessment & Plan:   Physical exam: Screening blood work    ordered Immunizations  Up to date  Colonoscopy  Up to date  Mammogram  Up to date  Gyn  Up to date  Eye exams  Up to date  Exercise  At least 30 minutes 3/week - walking Weight  BMI high - working on weight loss Substance abuse  none  See Problem List for Assessment and Plan of chronic medical problems.    This visit occurred during the SARS-CoV-2 public health emergency.  Safety protocols were in place, including screening questions prior to the visit, additional usage of staff PPE, and extensive cleaning of exam room while observing appropriate contact time as indicated for disinfecting solutions.

## 2019-12-08 ENCOUNTER — Encounter: Payer: Self-pay | Admitting: Internal Medicine

## 2019-12-08 ENCOUNTER — Telehealth: Payer: Self-pay

## 2019-12-08 ENCOUNTER — Other Ambulatory Visit: Payer: Self-pay

## 2019-12-08 ENCOUNTER — Ambulatory Visit (INDEPENDENT_AMBULATORY_CARE_PROVIDER_SITE_OTHER): Payer: BC Managed Care – PPO | Admitting: Internal Medicine

## 2019-12-08 VITALS — BP 136/74 | HR 95 | Temp 97.9°F | Resp 16 | Ht 64.0 in | Wt 220.0 lb

## 2019-12-08 DIAGNOSIS — R739 Hyperglycemia, unspecified: Secondary | ICD-10-CM | POA: Diagnosis not present

## 2019-12-08 DIAGNOSIS — K219 Gastro-esophageal reflux disease without esophagitis: Secondary | ICD-10-CM | POA: Diagnosis not present

## 2019-12-08 DIAGNOSIS — I4892 Unspecified atrial flutter: Secondary | ICD-10-CM

## 2019-12-08 DIAGNOSIS — E669 Obesity, unspecified: Secondary | ICD-10-CM

## 2019-12-08 DIAGNOSIS — G35 Multiple sclerosis: Secondary | ICD-10-CM

## 2019-12-08 DIAGNOSIS — I10 Essential (primary) hypertension: Secondary | ICD-10-CM

## 2019-12-08 DIAGNOSIS — Z0000 Encounter for general adult medical examination without abnormal findings: Secondary | ICD-10-CM | POA: Diagnosis not present

## 2019-12-08 DIAGNOSIS — K5909 Other constipation: Secondary | ICD-10-CM | POA: Diagnosis not present

## 2019-12-08 DIAGNOSIS — G479 Sleep disorder, unspecified: Secondary | ICD-10-CM

## 2019-12-08 LAB — CBC WITH DIFFERENTIAL/PLATELET
Basophils Absolute: 0 10*3/uL (ref 0.0–0.1)
Basophils Relative: 0.3 % (ref 0.0–3.0)
Eosinophils Absolute: 0.1 10*3/uL (ref 0.0–0.7)
Eosinophils Relative: 2 % (ref 0.0–5.0)
HCT: 38.2 % (ref 36.0–46.0)
Hemoglobin: 12.9 g/dL (ref 12.0–15.0)
Lymphocytes Relative: 46 % (ref 12.0–46.0)
Lymphs Abs: 3.2 10*3/uL (ref 0.7–4.0)
MCHC: 33.9 g/dL (ref 30.0–36.0)
MCV: 88.7 fl (ref 78.0–100.0)
Monocytes Absolute: 0.5 10*3/uL (ref 0.1–1.0)
Monocytes Relative: 7.7 % (ref 3.0–12.0)
Neutro Abs: 3 10*3/uL (ref 1.4–7.7)
Neutrophils Relative %: 44 % (ref 43.0–77.0)
Platelets: 271 10*3/uL (ref 150.0–400.0)
RBC: 4.3 Mil/uL (ref 3.87–5.11)
RDW: 15 % (ref 11.5–15.5)
WBC: 6.9 10*3/uL (ref 4.0–10.5)

## 2019-12-08 LAB — LIPID PANEL
Cholesterol: 213 mg/dL — ABNORMAL HIGH (ref 0–200)
HDL: 50.1 mg/dL (ref >39.00)
NonHDL: 163.18
Total CHOL/HDL Ratio: 4
Triglycerides: 222 mg/dL — ABNORMAL HIGH (ref 0.0–149.0)
VLDL: 44.4 mg/dL — ABNORMAL HIGH (ref 0.0–40.0)

## 2019-12-08 LAB — COMPREHENSIVE METABOLIC PANEL
ALT: 17 U/L (ref 0–35)
AST: 17 U/L (ref 0–37)
Albumin: 4.4 g/dL (ref 3.5–5.2)
Alkaline Phosphatase: 78 U/L (ref 39–117)
BUN: 15 mg/dL (ref 6–23)
CO2: 25 mEq/L (ref 19–32)
Calcium: 9.6 mg/dL (ref 8.4–10.5)
Chloride: 102 mEq/L (ref 96–112)
Creatinine, Ser: 0.86 mg/dL (ref 0.40–1.20)
GFR: 82.14 mL/min (ref >60.00)
Glucose, Bld: 111 mg/dL — ABNORMAL HIGH (ref 70–99)
Potassium: 3.5 mEq/L (ref 3.5–5.1)
Sodium: 137 mEq/L (ref 135–145)
Total Bilirubin: 0.3 mg/dL (ref 0.2–1.2)
Total Protein: 7.6 g/dL (ref 6.0–8.3)

## 2019-12-08 LAB — TSH: TSH: 1.8 u[IU]/mL (ref 0.35–4.50)

## 2019-12-08 LAB — LDL CHOLESTEROL, DIRECT: Direct LDL: 120 mg/dL

## 2019-12-08 LAB — HEMOGLOBIN A1C: Hgb A1c MFr Bld: 5.2 % (ref 4.6–6.5)

## 2019-12-08 MED ORDER — AZELASTINE HCL 0.1 % NA SOLN: NASAL | 3 refills | Status: DC

## 2019-12-08 NOTE — Assessment & Plan Note (Signed)
Chronic Following with cardiology Denies palps In sinus on exam On xarelto, metoprolol Cbc, cmp

## 2019-12-08 NOTE — Telephone Encounter (Signed)
Pt wanted to see what you thought about Ledbetter pink salt. If it was better for her than regular sea salt. Please advise.

## 2019-12-08 NOTE — Assessment & Plan Note (Signed)
Chronic GERD controlled Continue daily medication - protonix 3/week - can try to further taper

## 2019-12-08 NOTE — Assessment & Plan Note (Addendum)
Chronic With htn Exercising 3 days a week - walking - advised increasing to 5 days/week Discussed decreasing portions, avoid snacking -- she needs to decrease her caloric intake to lose weight

## 2019-12-08 NOTE — Telephone Encounter (Signed)
It contains less sodium diet and regular salt, but of course is still low sodium.  It is a better alternative.

## 2019-12-08 NOTE — Assessment & Plan Note (Signed)
Management per neurology.

## 2019-12-08 NOTE — Assessment & Plan Note (Signed)
Chronic Controlled with linzess, continue

## 2019-12-08 NOTE — Assessment & Plan Note (Signed)
Chronic Sleep still not great even with medication Continue trazodone at current dose

## 2019-12-08 NOTE — Assessment & Plan Note (Signed)
Chronic BP well controlled Current regimen effective and well tolerated Continue current medications at current doses cmp  

## 2019-12-09 ENCOUNTER — Encounter: Payer: Self-pay | Admitting: Internal Medicine

## 2019-12-09 NOTE — Telephone Encounter (Signed)
Sent my chart message with the response.

## 2019-12-16 ENCOUNTER — Ambulatory Visit: Payer: BC Managed Care – PPO | Attending: Family

## 2019-12-16 DIAGNOSIS — Z23 Encounter for immunization: Secondary | ICD-10-CM

## 2019-12-16 NOTE — Progress Notes (Signed)
   Covid-19 Vaccination Clinic  Name:  [name] [name]    MRN: [id number] DOB: 1960/11/21  12/16/2019  [name] was observed post Covid-19 immunization for 15 minutes without incident. She was provided with Vaccine Information Sheet and instruction to access the V-Safe system.   [name] was instructed to call 911 with any severe reactions post vaccine: [name] Difficulty breathing  . Swelling of face and throat  . A fast heartbeat  . A bad rash all over body  . Dizziness and weakness   Immunizations Administered    Name Date Dose VIS Date Route   Moderna COVID-19 Vaccine 12/16/2019  4:42 PM 0.5 mL 08/19/2019 Intramuscular   Manufacturer: Moderna   Lot: [id number]   [id number]

## 2019-12-22 ENCOUNTER — Encounter: Payer: Self-pay | Admitting: Internal Medicine

## 2019-12-22 ENCOUNTER — Ambulatory Visit: Payer: BC Managed Care – PPO | Admitting: Internal Medicine

## 2019-12-22 ENCOUNTER — Other Ambulatory Visit: Payer: Self-pay

## 2019-12-22 VITALS — BP 118/78 | HR 84 | Temp 98.0°F | Ht 64.0 in | Wt 219.6 lb

## 2019-12-22 DIAGNOSIS — R350 Frequency of micturition: Secondary | ICD-10-CM | POA: Diagnosis not present

## 2019-12-22 DIAGNOSIS — R0789 Other chest pain: Secondary | ICD-10-CM | POA: Diagnosis not present

## 2019-12-22 NOTE — Patient Instructions (Signed)
Give a urine sample downstairs.  Continue the cranberry juice.     Continue the heat and ice on the right side.

## 2019-12-22 NOTE — Assessment & Plan Note (Signed)
Acute Increased frequency and cloudy urine Associated with lower back pain, which she has had with previous UTIs Check urinalysis, culture Continue cranberry juice, which seems to be helping We will give an antibiotic depending on results

## 2019-12-22 NOTE — Progress Notes (Signed)
Subjective:    Patient ID: [name] [name], female    DOB: 10-15-1960, 59 y.o.   MRN: [id number]  HPI The patient is here for an acute visit.   covid #2 - 3/30 --had the injection in her left arm.  On 4/1 she started experiencing pain in her right axilla-side.  She describes the pain is like an underwire bra poking at her.  She denies any new activities or anything that could cause this pain.  She has had 2 chest wall exams by her surgeon and oncology because of her history of breast cancer within the past several months.  She was concerned what was causing this pain.  She thinks it is improving.  She denied any swelling.  She was using ice and heat.  She did not have any similar pain in the left axilla.  On the same day that she started to experience the above pain she also had left lower back pain.  She was having some mild urine symptoms-cloudy urine and increase in urination.  She was concerned about possible UTI.  She has had back pain in the past with her UTIs.  She has not seen any blood or any obvious dysuria.  She started drinking cranberry juice and that has helped.    Medications and allergies reviewed with patient and updated if appropriate.  Patient Active Problem List   Diagnosis Date Noted  . AC (acromioclavicular) arthritis 10/09/2019  . Acute pain of right shoulder 09/04/2019  . Acute sinus infection 08/19/2019  . Atrial flutter ([place]) 02/28/2019  . RLS (restless legs syndrome) 11/21/2018  . Sleep difficulties 08/06/2018  . Trigger finger 01/23/2018  . Meralgia paraesthetica, right 11/05/2017  . Iron deficiency 10/09/2017  . Vitamin D deficiency 06/15/2017  . Lumbar radiculopathy 04/21/2017  . Trigger index finger of left hand 04/21/2017  . Obesity (BMI 35.0-39.9 without comorbidity) 04/21/2017  . Left hip pain 10/31/2016  . Pes planus 10/31/2016  . Posterior tibialis muscle dysfunction 10/31/2016  . Breast pain, right 08/24/2016  . Palpitation 04/04/2016  .  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 Malignant neoplasm of upper-outer quadrant of right breast in female, 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[place]) 11/03/2015  . S/P mastectomy 05/13/2015  . Chemotherapy-induced neuropathy ([place]) 04/21/2015  . Genetic testing 12/11/2014  . Breast cancer of upper-outer quadrant of right female breast ([place]) 10/23/2014  . Allergic rhinitis 11/29/2009  . 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 CONSTIPATION, CHRONIC 08/17/2008  . METABOLIC SYNDROME X [id number]  . GERD 01/07/2008  . Essential hypertension 09/20/2007  . Multiple sclerosis ([place]) 09/18/2007    Current Outpatient Medications on File Prior to Visit  Medication Sig Dispense Refill  . azelastine (ASTELIN) 0.1 % nasal spray azelastine 137 mcg (0.1 %) nasal spray aerosol 30 mL 3  . b complex vitamins tablet Take 1 tablet by mouth daily.    [name] COPAXONE 40 MG/ML SOSY INJECT ONE SYRINGE SUBCUTANEOUSLY THREE TIMES PER WEEK AT LEAST 48 HOURS APART. ALLOW SYRINGE TO WARM TO ROOM TEMPERATURE FOR 20 MINUTES. REFRIGERATE. 36 mL 2  . Diclofenac Sodium 2 % SOLN Place 2 g onto the skin 2 (two) times daily. 112 g 3  . ergocalciferol (VITAMIN D2) 1.25 MG (50000 UT) capsule ergocalciferol (vitamin D2) 1,250 mcg (50,000 unit) capsule    . ferrous gluconate (FERGON) 324 MG tablet Take 1 tablet (324 mg total) by mouth 2 (two) times daily with a meal. (Patient taking differently: Take 324 mg by mouth 3 (three) times a week. ) 180 tablet  3  . fluticasone (FLONASE) 50 MCG/ACT nasal spray Place 2 sprays into both nostrils daily. 16 g 6  . gabapentin (NEURONTIN) 100 MG capsule Take 3 capsules (300 mg total) by mouth 3 (three) times daily. 810 capsule 1  . hydrochlorothiazide (HYDRODIURIL) 25 MG tablet Take 1 tablet (25 mg total) by mouth daily. 90 tablet 1  . linaclotide (LINZESS) 145 MCG CAPS capsule Take 2 capsules (290 mcg total) by mouth daily. 180 capsule 1  . loratadine (CLARITIN) 10 MG tablet Take 10 mg by mouth daily.    [name] losartan (COZAAR) 50 MG tablet  Take 1 tablet (50 mg total) by mouth daily. 90 tablet 1  . metoprolol tartrate (LOPRESSOR) 50 MG tablet TAKE 1 TABLET(50 MG) BY MOUTH TWICE DAILY 180 tablet 1  . pantoprazole (PROTONIX) 40 MG tablet TAKE 1 TABLET BY MOUTH EVERY DAY (Patient taking differently: 3 times per week) 90 tablet 1  . potassium chloride (KLOR-CON 10) 10 MEQ tablet Take 4 tablets (40 mEq total) by mouth daily. 120 tablet 5  . Probiotic Product (PROBIOTIC PO) Take 2 capsules by mouth daily.    . rivaroxaban (XARELTO) 20 MG TABS tablet Take 1 tablet (20 mg total) by mouth daily with supper. 90 tablet 1  . SYMBICORT 160-4.5 MCG/ACT inhaler INHALE 2 PUFFS INTO THE LUNGS TWICE DAILY (Patient taking differently: Takes as needed) 10.2 g 0  . traZODone (DESYREL) 50 MG tablet Take 1-2 tablets (50-100 mg total) by mouth at bedtime as needed for sleep. 60 tablet 3  . TURMERIC PO Take by mouth daily.    . valACYclovir (VALTREX) 500 MG tablet Take 500 mg by mouth 2 (two) times daily.    [name] VITAMIN D PO Take 2,000 Units by mouth daily.     No current facility-administered medications on file prior to visit.    Past Medical History:  Diagnosis Date  . Acute non-recurrent maxillary sinusitis 01/08/2017  . Allergic rhinitis due to other allergen   . Allergy   . Angioedema    ACEI  . 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 Anxiety   . Breast cancer of upper-outer quadrant of right female breast ([place]) 10/23/2014   s/p r mastectomy, neoadj chemo completed 04/21/15; neg genetic testing  . 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  . CHEST PAIN-UNSPECIFIED 05/19/2010   Qualifier: Diagnosis of  By: Aundra Dubin, MD, [place]    . Chronic fatigue 06/08/2016  . Colon polyps 05/2014   adenomatous, colo q 5y  . Conjunctiva disorder 08/06/2018  . CONSTIPATION, CHRONIC   . Cough 04/25/2016  . Depression 09/03/2016  . Ear canal dryness 01/23/2018  . FATIGUE 06/24/2008   Qualifier: Diagnosis of  By: Wynona Luna   . GERD   . Herpes  zoster without complication 99991111  . Hot flashes   . HYPERLIPIDEMIA   . HYPERTENSION   . Hypokalemia 12/10/2014  . Lactose intolerance   . LRTI (lower respiratory tract infection) 09/05/2017  . LUQ cramping 04/21/2017  . Migraine headache   . MIGRAINES, HX OF 09/18/2007   Qualifier: Diagnosis of  By: Danelle Earthly CMA, Darlene    . MS (multiple sclerosis) ([place]) 11/03/2015  . Multiple sclerosis ([place])   . Muscle strain 10/21/2016  . OBESITY, TRUNCAL   . OTHER SYMPTOMS INVOLVING DIGESTIVE SYSTEM OTHER 12/14/2009   Qualifier: Diagnosis of  By: Asa Lente MD, Jannifer Rodney   . Otitis, externa, infective 05/24/2016  . PERSONAL HX COLONIC POLYPS 04/21/2009   Qualifier: Diagnosis of  By: Trellis Paganini  PA-c, Amy S   . SINUSITIS 01/14/2010   Qualifier: Diagnosis of  By: Asa Lente MD, Jannifer Rodney Upper respiratory tract infection 09/23/2016  . UTI 08/19/2010   Qualifier: Diagnosis of  By: Marca Ancona RMA, Lucy    . Wheezing 09/10/2017    Past Surgical History:  Procedure Laterality Date  . BREAST BIOPSY Right    [place]   . COLONOSCOPY    . MASTECTOMY Right   . PORT-A-CATH REMOVAL Left 05/13/2015   Procedure: REMOVAL PORT-A-CATH;  Surgeon: [name], MD;  Location: [facility];  Service: General;  Laterality: Left;  . PORTACATH PLACEMENT Left 11/06/2014   Procedure: INSERTION PORT-A-CATH;  Surgeon: [name], MD;  Location: [facility];  Service: General;  Laterality: Left;  . SIMPLE MASTECTOMY WITH AXILLARY SENTINEL NODE BIOPSY Right 05/13/2015   Procedure: RIGHT TOTAL  MASTECTOMY WITH RIGHT  AXILLARY SENTINEL NODE BIOPSY;  Surgeon: [name], MD;  Location: [facility];  Service: General;  Laterality: Right;  . TUBAL LIGATION      Social History   Socioeconomic History  . Marital status: Single    Spouse name: Not on file  . Number of children: 2  . Years of education: 96  . Highest education level: Not on file  Occupational History  . Occupation: Solicitor: [employer]  Tobacco Use  . Smoking status: Never Smoker  . Smokeless tobacco: Never Used  Substance and Sexual Activity  . Alcohol use: No    Alcohol/week: 0.0 standard drinks  . Drug use: No  . Sexual activity: Not on file  Other Topics Concern  . 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    Patient is single and lives alone.    Patient is right handed.    Social Determinants of Health   Financial Resource Strain:   . Difficulty of Paying Living Expenses:   Food Insecurity:   . Worried About Charity fundraiser in the Last Year:   . Arboriculturist in the Last Year:   Transportation Needs:   . Film/video editor (Medical):   [name] Lack of Transportation (Non-Medical):   Physical Activity:   . Days of Exercise per Week:   . Minutes of Exercise per Session:   Stress:   . Feeling of Stress :   Social Connections:   . Frequency of Communication with Friends and Family:   . Frequency of Social Gatherings with Friends and Family:   . Attends Religious Services:   . Active Member of Clubs or Organizations:   . Attends Archivist Meetings:   [name] Marital Status:     Family History  Problem Relation Age of Onset  . Coronary artery disease Mother   . Heart disease Mother   . Diabetes Mother   . Hypertension Mother   . Stroke Mother   . Alcohol abuse Father   . COPD Maternal Grandmother   . Hypertension Other   . Hyperlipidemia Other   . Colon cancer Neg Hx   . Rectal cancer Neg Hx   . Stomach cancer Neg Hx   . Esophageal cancer Neg Hx     Review of Systems Per HPI    Objective:   Vitals:   12/22/19 1444  BP: 118/78  Pulse: 84  Temp: 98 F (36.7 C)  SpO2: 99%  BP Readings from Last 3 Encounters:  12/22/19 118/78  12/08/19 136/74  11/21/19 112/82   Wt Readings from Last 3 Encounters:  12/22/19 219 lb 9.6 oz (99.6 kg)  12/08/19 220 lb (99.8 kg)    11/21/19 217 lb (98.4 kg)   Body mass index is 37.69 kg/m.   Physical Exam Constitutional:      Appearance: Normal appearance. She is not toxic-appearing or diaphoretic.  Abdominal:     Palpations: Abdomen is soft.     Tenderness: There is no abdominal tenderness. There is no right CVA tenderness, left CVA tenderness, guarding or rebound.  Musculoskeletal:     Comments: Mild lower back tenderness with palpation.  Scar from mastectomy right chest wall.  Mild tenderness with palpation, there is a lumpy sensation underneath the scar, which she states is not new.  No obvious swollen lymph nodes or masses.  No skin changes.  No swelling.  Skin:    General: Skin is warm and dry.  Neurological:     Mental Status: She is alert.            Assessment & Plan:    See Problem List for Assessment and Plan of chronic medical problems.    This visit occurred during the SARS-CoV-2 public health emergency.  Safety protocols were in place, including screening questions prior to the visit, additional usage of staff PPE, and extensive cleaning of exam room while observing appropriate contact time as indicated for disinfecting solutions.

## 2019-12-22 NOTE — Assessment & Plan Note (Signed)
Acute Started the day after her second Covid vaccine and seems to be along the scar from her mastectomy site No swelling She has been using ice and heat Pain is improving ?  Lymph node inflammation from Covid vaccine No palpable abnormalities on exam and she has had 2 exams by her surgeon and oncologist in the past few months At this point her pain is improving and she will continue to treat it symptomatically She will let me or oncology/surgery know if the pain persists

## 2019-12-23 ENCOUNTER — Encounter: Payer: Self-pay | Admitting: Internal Medicine

## 2019-12-23 LAB — URINALYSIS, ROUTINE W REFLEX MICROSCOPIC
Bilirubin Urine: NEGATIVE
Hgb urine dipstick: NEGATIVE
Ketones, ur: NEGATIVE
Leukocytes,Ua: NEGATIVE
Nitrite: NEGATIVE
RBC / HPF: NONE SEEN (ref 0–?)
Specific Gravity, Urine: 1.02 (ref 1.000–1.030)
Total Protein, Urine: NEGATIVE
Urine Glucose: NEGATIVE
Urobilinogen, UA: 0.2 (ref 0.0–1.0)
WBC, UA: NONE SEEN (ref 0–?)
pH: 5.5 (ref 5.0–8.0)

## 2019-12-23 LAB — URINE CULTURE

## 2019-12-24 ENCOUNTER — Encounter: Payer: Self-pay | Admitting: Internal Medicine

## 2019-12-24 ENCOUNTER — Other Ambulatory Visit: Payer: Self-pay

## 2019-12-24 MED ORDER — HYDROCHLOROTHIAZIDE 25 MG PO TABS: 25.0000 mg | ORAL_TABLET | Freq: Every day | ORAL | 3 refills | Status: DC

## 2019-12-24 NOTE — Telephone Encounter (Signed)
Medication Refill - Medication: hydrochlorothiazide (HYDRODIURIL) 25 MG tablet   Has the patient contacted their pharmacy? Yes.   (Agent: If no, request that the patient contact the pharmacy for the refill.) (Agent: If yes, when and what did the pharmacy advise?)  Preferred Pharmacy (with phone number or street name): [id number] - [place], [place] - [place]: Please be advised that RX refills may take up to 3 business days. We ask that you follow-up with your pharmacy.

## 2019-12-24 NOTE — Telephone Encounter (Signed)
erx has been sent in.  °

## 2020-01-03 ENCOUNTER — Other Ambulatory Visit: Payer: Self-pay | Admitting: Internal Medicine

## 2020-01-08 ENCOUNTER — Telehealth: Payer: Self-pay | Admitting: *Deleted

## 2020-01-08 ENCOUNTER — Ambulatory Visit: Payer: BC Managed Care – PPO | Admitting: Cardiology

## 2020-01-08 ENCOUNTER — Encounter: Payer: Self-pay | Admitting: Cardiology

## 2020-01-08 ENCOUNTER — Other Ambulatory Visit: Payer: Self-pay | Admitting: Internal Medicine

## 2020-01-08 ENCOUNTER — Other Ambulatory Visit: Payer: Self-pay

## 2020-01-08 VITALS — BP 116/78 | HR 79 | Ht 64.0 in | Wt 222.4 lb

## 2020-01-08 DIAGNOSIS — I484 Atypical atrial flutter: Secondary | ICD-10-CM | POA: Diagnosis not present

## 2020-01-08 DIAGNOSIS — G4733 Obstructive sleep apnea (adult) (pediatric): Secondary | ICD-10-CM

## 2020-01-08 DIAGNOSIS — I10 Essential (primary) hypertension: Secondary | ICD-10-CM | POA: Diagnosis not present

## 2020-01-08 DIAGNOSIS — E669 Obesity, unspecified: Secondary | ICD-10-CM

## 2020-01-08 NOTE — Progress Notes (Signed)
Cardiology Office Note:    Date:  01/08/2020   ID:  [name] [name], DOB February 28, 1961, MRN [id number]  PCP:  Binnie Rail, MD  Cardiologist:  Fransico Him, MD    Referring MD: Binnie Rail, MD   Chief Complaint  Patient presents with  . Atrial Flutter  . Hypertension  . Sleep Apnea    History of Present Illness:    [name] [name] is a 59 y.o. female with a hx of MS, insomnia, RLS, obesity and HTN and PAFlutter.  She is on Xarelto for a CHADS2VASC score of 2.  A sleep study was ordered and she underwent PSG showing mild OSA with an AHI of 8 and is now on CPAP.    She is here today for followup and is doing well.  She denies any chest pain or pressure, SOB, DOE, PND, orthopnea, LE edema, dizziness, palpitations or syncope. She is compliant with her meds and is tolerating meds with no SE.  She is doing well with her CPAP device and thinks that she has gotten used to it.  She tolerates the mask and feels the pressure is adequate but intermittently she gets bad bloating when she wakes up in the am and then will not use it for a few days to get rid of the gas.  Since going on CPAP she feels rested in the am and has no significant daytime sleepiness.  SHe denies any significant mouth or nasal dryness or nasal congestion.  SHe does not think that he snores.     Past Medical History:  Diagnosis Date  . Acute non-recurrent maxillary sinusitis 01/08/2017  . Allergic rhinitis due to other allergen   . Allergy   . Angioedema    ACEI  . 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 Anxiety   . Breast cancer of upper-outer quadrant of right female breast ([place]) 10/23/2014   s/p r mastectomy, neoadj chemo completed 04/21/15; neg genetic testing  . 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  . CHEST PAIN-UNSPECIFIED 05/19/2010   Qualifier: Diagnosis of  By: Aundra Dubin, MD, [place]    . Chronic fatigue 06/08/2016  . Colon polyps 05/2014   adenomatous, colo q 5y  . Conjunctiva disorder  08/06/2018  . CONSTIPATION, CHRONIC   . Cough 04/25/2016  . Depression 09/03/2016  . Ear canal dryness 01/23/2018  . FATIGUE 06/24/2008   Qualifier: Diagnosis of  By: Wynona Luna   . GERD   . Herpes zoster without complication 99991111  . Hot flashes   . HYPERLIPIDEMIA   . HYPERTENSION   . Hypokalemia 12/10/2014  . Lactose intolerance   . LRTI (lower respiratory tract infection) 09/05/2017  . LUQ cramping 04/21/2017  . Migraine headache   . MIGRAINES, HX OF 09/18/2007   Qualifier: Diagnosis of  By: Danelle Earthly CMA, Darlene    . MS (multiple sclerosis) ([place]) 11/03/2015  . Multiple sclerosis ([place])   . Muscle strain 10/21/2016  . OBESITY, TRUNCAL   . OTHER SYMPTOMS INVOLVING DIGESTIVE SYSTEM OTHER 12/14/2009   Qualifier: Diagnosis of  By: Asa Lente MD, Jannifer Rodney   . Otitis, externa, infective 05/24/2016  . PERSONAL HX COLONIC POLYPS 04/21/2009   Qualifier: Diagnosis of  By: Trellis Paganini PA-c, Amy S   . SINUSITIS 01/14/2010   Qualifier: Diagnosis of  By: Asa Lente MD, Jannifer Rodney Upper respiratory tract infection 09/23/2016  . UTI 08/19/2010   Qualifier: Diagnosis of  By: Marca Ancona RMA, Lucy    . Wheezing 09/10/2017    Past  Surgical History:  Procedure Laterality Date  . BREAST BIOPSY Right    [place]   . COLONOSCOPY    . MASTECTOMY Right   . PORT-A-CATH REMOVAL Left 05/13/2015   Procedure: REMOVAL PORT-A-CATH;  Surgeon: [name], MD;  Location: [facility];  Service: General;  Laterality: Left;  . PORTACATH PLACEMENT Left 11/06/2014   Procedure: INSERTION PORT-A-CATH;  Surgeon: [name], MD;  Location: [facility];  Service: General;  Laterality: Left;  . SIMPLE MASTECTOMY WITH AXILLARY SENTINEL NODE BIOPSY Right 05/13/2015   Procedure: RIGHT TOTAL  MASTECTOMY WITH RIGHT  AXILLARY SENTINEL NODE BIOPSY;  Surgeon: [name], MD;  Location: [facility];  Service: General;  Laterality: Right;  . TUBAL LIGATION      Current Medications: Current Meds  Medication Sig  .  azelastine (ASTELIN) 0.1 % nasal spray azelastine 137 mcg (0.1 %) nasal spray aerosol  . b complex vitamins tablet Take 1 tablet by mouth daily.  [name] COPAXONE 40 MG/ML SOSY INJECT ONE SYRINGE SUBCUTANEOUSLY THREE TIMES PER WEEK AT LEAST 48 HOURS APART. ALLOW SYRINGE TO WARM TO ROOM TEMPERATURE FOR 20 MINUTES. REFRIGERATE.  . Diclofenac Sodium 2 % SOLN Place 2 g onto the skin 2 (two) times daily.  . ergocalciferol (VITAMIN D2) 1.25 MG (50000 UT) capsule ergocalciferol (vitamin D2) 1,250 mcg (50,000 unit) capsule  . ferrous gluconate (FERGON) 324 MG tablet Take 1 tablet (324 mg total) by mouth 2 (two) times daily with a meal. (Patient taking differently: Take 324 mg by mouth 3 (three) times a week. )  . fluticasone (FLONASE) 50 MCG/ACT nasal spray SHAKE LIQUID AND USE 2 SPRAYS IN EACH NOSTRIL DAILY  . gabapentin (NEURONTIN) 100 MG capsule Take 3 capsules (300 mg total) by mouth 3 (three) times daily.  . hydrochlorothiazide (HYDRODIURIL) 25 MG tablet Take 1 tablet (25 mg total) by mouth daily.  [name] linaclotide (LINZESS) 145 MCG CAPS capsule Take 2 capsules (290 mcg total) by mouth daily.  [name] loratadine (CLARITIN) 10 MG tablet Take 10 mg by mouth daily.  [name] losartan (COZAAR) 50 MG tablet Take 1 tablet (50 mg total) by mouth daily.  . metoprolol tartrate (LOPRESSOR) 50 MG tablet TAKE 1 TABLET(50 MG) BY MOUTH TWICE DAILY  . pantoprazole (PROTONIX) 40 MG tablet TAKE 1 TABLET BY MOUTH EVERY DAY (Patient taking differently: 3 times per week)  . potassium chloride (KLOR-CON 10) 10 MEQ tablet Take 4 tablets (40 mEq total) by mouth daily.  . Probiotic Product (PROBIOTIC PO) Take 2 capsules by mouth daily.  . rivaroxaban (XARELTO) 20 MG TABS tablet Take 1 tablet (20 mg total) by mouth daily with supper.  . SYMBICORT 160-4.5 MCG/ACT inhaler INHALE 2 PUFFS INTO THE LUNGS TWICE DAILY (Patient taking differently: Takes as needed)  . traZODone (DESYREL) 50 MG tablet Take 1-2 tablets (50-100 mg total) by mouth at bedtime  as needed for sleep.  . TURMERIC PO Take by mouth daily.  . valACYclovir (VALTREX) 500 MG tablet Take 500 mg by mouth 2 (two) times daily.  [name] VITAMIN D PO Take 2,000 Units by mouth daily.     Allergies:   Ace inhibitors, Clarithromycin, Contrave [naltrexone-bupropion hcl er], and Levaquin [levofloxacin]   Social History   Socioeconomic History  . Marital status: Single    Spouse name: Not on file  . Number of children: 2  . Years of education: 56  . Highest education level: Not on file  Occupational History  . 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 Smoking status: Never Smoker  . Smokeless tobacco: Never Used  Substance and Sexual Activity  . Alcohol use: No    Alcohol/week: 0.0 standard drinks  . Drug use: No  . Sexual activity: Not on file  Other Topics Concern  . 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    Patient is single and lives alone.    Patient is right handed.    Social Determinants of Health   Financial Resource Strain:   . Difficulty of Paying Living Expenses:   Food Insecurity:   . Worried About Charity fundraiser in the Last Year:   . Arboriculturist in the Last Year:   Transportation Needs:   . Film/video editor (Medical):   [name] Lack of Transportation (Non-Medical):   Physical Activity:   . Days of Exercise per Week:   . Minutes of Exercise per Session:   Stress:   . Feeling of Stress :   Social Connections:   . Frequency of Communication with Friends and Family:   . Frequency of Social Gatherings with Friends and Family:   . Attends Religious Services:   . Active Member of Clubs or Organizations:   . Attends Archivist Meetings:   [name] Marital Status:      Family History: The patient's family history includes Alcohol abuse in her father; COPD in her maternal grandmother; Coronary artery disease  in her mother; Diabetes in her mother; Heart disease in her mother; Hyperlipidemia in an other family member; Hypertension in her mother and another family member; Stroke in her mother. There is no history of Colon cancer, Rectal cancer, Stomach cancer, or Esophageal cancer.  ROS:   Please see the history of present illness.    ROS  All other systems reviewed and negative.   EKGs/Labs/Other Studies Reviewed:    The following studies were reviewed today: PAP compliance download  EKG:  EKG is not  ordered today.    Recent Labs: 07/11/2019: Magnesium 1.9 12/08/2019: ALT 17; BUN 15; Creatinine, Ser 0.86; Hemoglobin 12.9; Platelets 271.0; Potassium 3.5; Sodium 137; TSH 1.80   Recent Lipid Panel    Component Value Date/Time   CHOL 213 (H) 12/08/2019 1606   CHOL 204 (H) 02/21/2017 1027   TRIG 222.0 (H) 12/08/2019 1606   HDL 50.10 12/08/2019 1606   HDL 47 02/21/2017 1027   CHOLHDL 4 12/08/2019 1606   VLDL 44.4 (H) 12/08/2019 1606   LDLCALC 121 (H) 10/16/2018 1012   LDLCALC 121 (H) 02/21/2017 1027   LDLDIRECT 120.0 12/08/2019 1606    Physical Exam:    VS:  BP 116/78   Pulse 79   Ht 5\' 4"  (1.626 m)   Wt 222 lb 6.4 oz (100.9 kg)   LMP 11/01/2012   SpO2 97%   BMI 38.17 kg/m     Wt Readings from Last 3 Encounters:  01/08/20 222 lb 6.4 oz (100.9 kg)  12/22/19 219 lb 9.6 oz (99.6 kg)  12/08/19 220 lb (99.8 kg)     GEN:  Well nourished, well developed in no acute distress HEENT: Normal NECK: No JVD; No carotid bruits LYMPHATICS: No lymphadenopathy CARDIAC: RRR, no murmurs, rubs, gallops RESPIRATORY:  Clear to auscultation without rales, wheezing or rhonchi  ABDOMEN: Soft, non-tender, non-distended MUSCULOSKELETAL:  No edema; No deformity  SKIN: Warm and dry NEUROLOGIC:  Alert and  oriented x 3 PSYCHIATRIC:  Normal affect   ASSESSMENT:    1. Atypical atrial flutter ([place])   2. Essential hypertension   3. Obesity (BMI 35.0-39.9 without comorbidity)   4. OSA (obstructive  sleep apnea)    PLAN:    In order of problems listed above:  1.  Paroxysmal atrial flutter -maintaining NSR on exam and denies any significant palptiations -continue Lopressor 50mg  BID and Xarelto 20mg  daily (CHADS2VASC 2) -outside labs from PCP reviewed and showed Creatinine of 0.86 and Hbg 12.9 in march 2021  2.  HTN -BP is controlled on exam today -continue Lopressor 50mg  BID, HCTZ 25mg  daily and Losartan 50mg  daily  3.  Obesity -I have encouraged she to get into a routine exercise program and cut back on carbs and portions.   4.  OSA - The patient is tolerating PAP therapy well without any problems. The PAP download was reviewed today and showed an AHI of 1.4/hr on auto PAP  with 40% compliance in using more than 4 hours nightly.  The patient has been using and benefiting from PAP use and will continue to benefit from therapy.  -suspect her bloating is related to being on Auto PAP and breathing through her mouth at times and the auto pressure increases to compensate for lost air.   -will add a chin strap and set her on CPAP at 8cm H2O and get a download in 4 weeks at time of virtual visit     Medication Adjustments/Labs and Tests Ordered: Current medicines are reviewed at length with the patient today.  Concerns regarding medicines are outlined above.  No orders of the defined types were placed in this encounter.  No orders of the defined types were placed in this encounter.   Signed, Fransico Him, MD  01/08/2020 9:30 AM    [place]

## 2020-01-08 NOTE — Telephone Encounter (Signed)
Order placed via electronic fax to [facility].

## 2020-01-08 NOTE — Patient Instructions (Addendum)
Medication Instructions:  Your physician recommends that you continue on your current medications as directed. Please refer to the Current Medication list given to you today.  *If you need a refill on your cardiac medications before your next appointment, please call your pharmacy*  Follow-Up: At [facility], you and your health needs are our priority.  As part of our continuing mission to provide you with exceptional heart care, we have created designated Provider Care Teams.  These Care Teams include your primary Cardiologist (physician) and Advanced Practice Providers (APPs -  Physician Assistants and Nurse Practitioners) who all work together to provide you with the care you need, when you need it.  Your next appointment:   4 week(s)  The format for your next appointment:   Virtual Visit   Provider:   Fransico Him, MD   Other Instructions You have been referred to Medical Weight Management.

## 2020-01-08 NOTE — Telephone Encounter (Signed)
-----   Message from Sueanne Margarita, MD sent at 01/08/2020  9:48 AM EDT ----- add a chin strap and set her on CPAP at 8cm H2O and get a download in 4 weeks at time of virtual visit

## 2020-01-13 ENCOUNTER — Ambulatory Visit: Payer: BC Managed Care – PPO | Admitting: Dietician

## 2020-01-16 ENCOUNTER — Encounter: Payer: Self-pay | Admitting: Internal Medicine

## 2020-01-16 MED ORDER — FUROSEMIDE 20 MG PO TABS: 20.0000 mg | ORAL_TABLET | Freq: Every day | ORAL | 3 refills | Status: DC

## 2020-01-19 ENCOUNTER — Other Ambulatory Visit: Payer: Self-pay

## 2020-01-19 DIAGNOSIS — G5711 Meralgia paresthetica, right lower limb: Secondary | ICD-10-CM

## 2020-01-19 MED ORDER — GABAPENTIN 100 MG PO CAPS: 300.0000 mg | ORAL_CAPSULE | Freq: Three times a day (TID) | ORAL | 1 refills | Status: DC

## 2020-01-26 ENCOUNTER — Telehealth: Payer: Self-pay | Admitting: Family Medicine

## 2020-01-26 ENCOUNTER — Telehealth: Payer: Self-pay | Admitting: Internal Medicine

## 2020-01-26 NOTE — Telephone Encounter (Signed)
Patient called asking if you would call her back. She would not go into detail about what it was regarding.

## 2020-01-26 NOTE — Telephone Encounter (Signed)
Patient called asking if you would call her regarding her shoulder? She had a couple questions for you.

## 2020-01-27 NOTE — Telephone Encounter (Signed)
Wants to talk about medication for weight loss. Appointment scheduled.

## 2020-01-27 NOTE — Progress Notes (Signed)
Subjective:    Patient ID: [name] [name], female    DOB: 1961/01/04, 59 y.o.   MRN: [id number]  HPI The patient is here for an acute visit.   Obesity:    She is exercising - she walks 5 days a week - 30 minutes or about 2 miles.  She is also eating healthy and watching how much she is eating and still is struggling with her weight.  She is frustrated.  She wants to know what else she can do.  Is there anything she can take. She has taken phentermine in the past.  It did not help much.     Medications and allergies reviewed with patient and updated if appropriate.  Patient Active Problem List   Diagnosis Date Noted  . Urine frequency 12/22/2019  . Right-sided chest wall pain 12/22/2019  . AC (acromioclavicular) arthritis 10/09/2019  . Acute pain of right shoulder 09/04/2019  . Acute sinus infection 08/19/2019  . Atrial flutter ([place]) 02/28/2019  . RLS (restless legs syndrome) 11/21/2018  . Sleep difficulties 08/06/2018  . Trigger finger 01/23/2018  . Meralgia paraesthetica, right 11/05/2017  . Iron deficiency 10/09/2017  . Vitamin D deficiency 06/15/2017  . Lumbar radiculopathy 04/21/2017  . Trigger index finger of left hand 04/21/2017  . Morbid obesity ([place]) 04/21/2017  . Left hip pain 10/31/2016  . Pes planus 10/31/2016  . Posterior tibialis muscle dysfunction 10/31/2016  . Breast pain, right 08/24/2016  . Palpitation 04/04/2016  . 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 Malignant neoplasm of upper-outer quadrant of right breast in female, 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[place]) 11/03/2015  . S/P mastectomy 05/13/2015  . Chemotherapy-induced neuropathy ([place]) 04/21/2015  . Genetic testing 12/11/2014  . Breast cancer of upper-outer quadrant of right female breast ([place]) 10/23/2014  . Allergic rhinitis 11/29/2009  . 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 CONSTIPATION, CHRONIC 08/17/2008  . METABOLIC SYNDROME X 99991111  . GERD 01/07/2008  . Essential hypertension 09/20/2007  . Multiple sclerosis  ([place]) 09/18/2007    Current Outpatient Medications on File Prior to Visit  Medication Sig Dispense Refill  . azelastine (ASTELIN) 0.1 % nasal spray azelastine 137 mcg (0.1 %) nasal spray aerosol 30 mL 3  . b complex vitamins tablet Take 1 tablet by mouth daily.    [name] COPAXONE 40 MG/ML SOSY INJECT ONE SYRINGE SUBCUTANEOUSLY THREE TIMES PER WEEK AT LEAST 48 HOURS APART. ALLOW SYRINGE TO WARM TO ROOM TEMPERATURE FOR 20 MINUTES. REFRIGERATE. 36 mL 2  . Diclofenac Sodium 2 % SOLN Place 2 g onto the skin 2 (two) times daily. 112 g 3  . ergocalciferol (VITAMIN D2) 1.25 MG (50000 UT) capsule ergocalciferol (vitamin D2) 1,250 mcg (50,000 unit) capsule    . ferrous gluconate (FERGON) 324 MG tablet Take 1 tablet (324 mg total) by mouth 2 (two) times daily with a meal. (Patient taking differently: Take 324 mg by mouth 3 (three) times a week. ) 180 tablet 3  . fluticasone (FLONASE) 50 MCG/ACT nasal spray SHAKE LIQUID AND USE 2 SPRAYS IN EACH NOSTRIL DAILY 16 g 6  . gabapentin (NEURONTIN) 100 MG capsule Take 3 capsules (300 mg total) by mouth 3 (three) times daily. 810 capsule 1  . linaclotide (LINZESS) 145 MCG CAPS capsule Take 2 capsules (290 mcg total) by mouth daily. 180 capsule 1  . loratadine (CLARITIN) 10 MG tablet Take 10 mg by mouth daily.    [name] losartan (COZAAR) 50 MG tablet Take 1 tablet (50 mg total) by mouth daily. 90 tablet 1  .  metoprolol tartrate (LOPRESSOR) 50 MG tablet TAKE 1 TABLET(50 MG) BY MOUTH TWICE DAILY 180 tablet 1  . pantoprazole (PROTONIX) 40 MG tablet TAKE 1 TABLET BY MOUTH EVERY DAY (Patient taking differently: 3 times per week) 90 tablet 1  . potassium chloride (KLOR-CON 10) 10 MEQ tablet Take 4 tablets (40 mEq total) by mouth daily. 120 tablet 5  . Probiotic Product (PROBIOTIC PO) Take 2 capsules by mouth daily.    . SYMBICORT 160-4.5 MCG/ACT inhaler INHALE 2 PUFFS INTO THE LUNGS TWICE DAILY (Patient taking differently: Takes as needed) 10.2 g 0  . TURMERIC PO Take by mouth  daily.    . valACYclovir (VALTREX) 500 MG tablet Take 500 mg by mouth 2 (two) times daily.    [name] VITAMIN D PO Take 2,000 Units by mouth daily.    Alveda Reasons 20 MG TABS tablet TAKE 1 TABLET(20 MG) BY MOUTH DAILY WITH SUPPER 30 tablet 1   No current facility-administered medications on file prior to visit.    Past Medical History:  Diagnosis Date  . Acute non-recurrent maxillary sinusitis 01/08/2017  . Allergic rhinitis due to other allergen   . Allergy   . Angioedema    ACEI  . 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 Anxiety   . Breast cancer of upper-outer quadrant of right female breast ([place]) 10/23/2014   s/p r mastectomy, neoadj chemo completed 04/21/15; neg genetic testing  . 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  . CHEST PAIN-UNSPECIFIED 05/19/2010   Qualifier: Diagnosis of  By: Aundra Dubin, MD, [place]    . Chronic fatigue 06/08/2016  . Colon polyps 05/2014   adenomatous, colo q 5y  . Conjunctiva disorder 08/06/2018  . CONSTIPATION, CHRONIC   . Cough 04/25/2016  . Depression 09/03/2016  . Ear canal dryness 01/23/2018  . FATIGUE 06/24/2008   Qualifier: Diagnosis of  By: Wynona Luna   . GERD   . Herpes zoster without complication 99991111  . Hot flashes   . HYPERLIPIDEMIA   . HYPERTENSION   . Hypokalemia 12/10/2014  . Lactose intolerance   . LRTI (lower respiratory tract infection) 09/05/2017  . LUQ cramping 04/21/2017  . Migraine headache   . MIGRAINES, HX OF 09/18/2007   Qualifier: Diagnosis of  By: Danelle Earthly CMA, Darlene    . MS (multiple sclerosis) ([place]) 11/03/2015  . Multiple sclerosis ([place])   . Muscle strain 10/21/2016  . OBESITY, TRUNCAL   . OTHER SYMPTOMS INVOLVING DIGESTIVE SYSTEM OTHER 12/14/2009   Qualifier: Diagnosis of  By: Asa Lente MD, Jannifer Rodney   . Otitis, externa, infective 05/24/2016  . PERSONAL HX COLONIC POLYPS 04/21/2009   Qualifier: Diagnosis of  By: Trellis Paganini PA-c, Amy S   . SINUSITIS 01/14/2010   Qualifier: Diagnosis of  By: Asa Lente MD,  Jannifer Rodney Upper respiratory tract infection 09/23/2016  . UTI 08/19/2010   Qualifier: Diagnosis of  By: Marca Ancona RMA, Lucy    . Wheezing 09/10/2017    Past Surgical History:  Procedure Laterality Date  . BREAST BIOPSY Right    [place]   . COLONOSCOPY    . MASTECTOMY Right   . PORT-A-CATH REMOVAL Left 05/13/2015   Procedure: REMOVAL PORT-A-CATH;  Surgeon: [name], MD;  Location: [place];  Service: General;  Laterality: Left;  . PORTACATH PLACEMENT Left 11/06/2014   Procedure: INSERTION PORT-A-CATH;  Surgeon: [name], MD;  Location: [place];  Service: General;  Laterality: Left;  . SIMPLE MASTECTOMY WITH AXILLARY SENTINEL NODE BIOPSY Right 05/13/2015  Procedure: RIGHT TOTAL  MASTECTOMY WITH RIGHT  AXILLARY SENTINEL NODE BIOPSY;  Surgeon: [name], MD;  Location: [place];  Service: General;  Laterality: Right;  . TUBAL LIGATION      Social History   Socioeconomic History  . Marital status: Single    Spouse name: Not on file  . Number of children: 2  . Years of education: 54  . Highest education level: Not on file  Occupational History  . Occupation: Solicitor: [employer]  Tobacco Use  . Smoking status: Never Smoker  . Smokeless tobacco: Never Used  Substance and Sexual Activity  . Alcohol use: No    Alcohol/week: 0.0 standard drinks  . Drug use: No  . Sexual activity: Not on file  Other Topics Concern  . 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    Patient is single and lives alone.    Patient is right handed.    Social Determinants of Health   Financial Resource Strain:   . Difficulty of Paying Living Expenses:   Food Insecurity:   . Worried About Charity fundraiser in the Last Year:   . Arboriculturist in the Last Year:   Transportation Needs:   . Film/video editor (Medical):   [name] Lack of  Transportation (Non-Medical):   Physical Activity:   . Days of Exercise per Week:   . Minutes of Exercise per Session:   Stress:   . Feeling of Stress :   Social Connections:   . Frequency of Communication with Friends and Family:   . Frequency of Social Gatherings with Friends and Family:   . Attends Religious Services:   . Active Member of Clubs or Organizations:   . Attends Archivist Meetings:   [name] Marital Status:     Family History  Problem Relation Age of Onset  . Coronary artery disease Mother   . Heart disease Mother   . Diabetes Mother   . Hypertension Mother   . Stroke Mother   . Alcohol abuse Father   . COPD Maternal Grandmother   . Hypertension Other   . Hyperlipidemia Other   . Colon cancer Neg Hx   . Rectal cancer Neg Hx   . Stomach cancer Neg Hx   . Esophageal cancer Neg Hx     Review of Systems     Objective:   Vitals:   01/28/20 1552  BP: 122/78  Pulse: 97  Resp: 16  Temp: 98.3 F (36.8 C)  SpO2: 97%   BP Readings from Last 3 Encounters:  01/28/20 122/78  01/08/20 116/78  12/22/19 118/78   Wt Readings from Last 3 Encounters:  01/28/20 221 lb 12.8 oz (100.6 kg)  01/08/20 222 lb 6.4 oz (100.9 kg)  12/22/19 219 lb 9.6 oz (99.6 kg)   Body mass index is 38.07 kg/m.   Physical Exam         Assessment & Plan:    See Problem List for Assessment and Plan of chronic medical problems.    This visit occurred during the SARS-CoV-2 public health emergency.  Safety protocols were in place, including screening questions prior to the visit, additional usage of staff PPE, and extensive cleaning of exam room while observing appropriate contact time as indicated for disinfecting solutions.

## 2020-01-27 NOTE — Telephone Encounter (Signed)
Recommended ice and Pennsaid. Patient declines appt at this time and will try icing shoulder before scheduling another injection.

## 2020-01-28 ENCOUNTER — Encounter: Payer: Self-pay | Admitting: Internal Medicine

## 2020-01-28 ENCOUNTER — Ambulatory Visit: Payer: BC Managed Care – PPO | Admitting: Internal Medicine

## 2020-01-28 ENCOUNTER — Other Ambulatory Visit: Payer: Self-pay

## 2020-01-28 DIAGNOSIS — E669 Obesity, unspecified: Secondary | ICD-10-CM

## 2020-01-28 MED ORDER — LOSARTAN POTASSIUM 50 MG PO TABS: 50.0000 mg | ORAL_TABLET | Freq: Every day | ORAL | 1 refills | Status: DC

## 2020-01-28 MED ORDER — QSYMIA 3.75-23 MG PO CP24: ORAL_CAPSULE | ORAL | 5 refills | Status: DC

## 2020-01-28 NOTE — Patient Instructions (Addendum)
  Medications reviewed and updated.  Changes include :   Start qsymia as directed  Your prescription(s) have been submitted to your pharmacy. Please take as directed and contact our office if you believe you are having problem(s) with the medication(s).     Please followup in 6 months

## 2020-01-28 NOTE — Assessment & Plan Note (Addendum)
Chronic W/ comorbidities of htn, gerd Walking for 30 minutes Eating less and eating healthy Struggling to lose weight - wants to try medication again - she needs help Discussed options - will try qsymia Continue regular exercise, limiting sweets/carbs and low calorie intake

## 2020-01-31 ENCOUNTER — Other Ambulatory Visit: Payer: Self-pay | Admitting: Internal Medicine

## 2020-02-08 NOTE — Progress Notes (Signed)
Virtual Visit via Telephone Note   This visit type was conducted due to national recommendations for restrictions regarding the COVID-19 Pandemic (e.g. social distancing) in an effort to limit this patient's exposure and mitigate transmission in our community.  Due to her co-morbid illnesses, this patient is at least at moderate risk for complications without adequate follow up.  This format is felt to be most appropriate for this patient at this time.  All issues noted in this document were discussed and addressed.  A limited physical exam was performed with this format.  Please refer to the patient's chart for her consent to telehealth for [facility].   Evaluation Performed:  Follow-up visit  This visit type was conducted due to national recommendations for restrictions regarding the COVID-19 Pandemic (e.g. social distancing).  This format is felt to be most appropriate for this patient at this time.  All issues noted in this document were discussed and addressed.  No physical exam was performed (except for noted visual exam findings with Video Visits).  Please refer to the patient's chart (MyChart message for video visits and phone note for telephone visits) for the patient's consent to telehealth for [facility].  Date:  02/09/2020   ID:  [name] [name], DOB 1960/12/05, MRN [id number]  Patient Location:  Home  Provider location:   [place]  PCP:  Binnie Rail, MD  Cardiologist:  Fransico Him, MD  Electrophysiologist:  None   Chief Complaint:  OSA,  HTN  History of Present Illness:    [name] [name] is a 59 y.o. female who presents via audio/video conferencing for a telehealth visit today.    [name] [name] is a 59 y.o. female with a hx of MS, insomnia, RLS, obesity and HTNand PAFlutter. She is on Xarelto for a CHADS2VASC score of 2. A sleep study was ordered and she underwent PSG showing mild OSA with an AHI of 8 and is now on CPAP. I saw her a month ago  and she was having problems with severe bloating that would last for days at a time.  I added a chin strap and set her on 8cm H2O.  She is now back for followup.    She is doing well with his CPAP device and thinks that he has gotten used to it.  He tolerates the mask and feels the pressure is adequate.  Since going on CPAP she feels rested in the am and has no significant daytime sleepiness.  She denies any significant mouth or nasal dryness or nasal congestion.  He does not think that he snores.    The patient does not have symptoms concerning for COVID-19 infection (fever, chills, cough, or new shortness of breath).   Prior CV studies:   The following studies were reviewed today:  PAP compliance download  Past Medical History:  Diagnosis Date  . Acute non-recurrent maxillary sinusitis 01/08/2017  . Allergic rhinitis due to other allergen   . Allergy   . Angioedema    ACEI  . 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 Anxiety   . Breast cancer of upper-outer quadrant of right female breast ([place]) 10/23/2014   s/p r mastectomy, neoadj chemo completed 04/21/15; neg genetic testing  . 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  . CHEST PAIN-UNSPECIFIED 05/19/2010   Qualifier: Diagnosis of  By: Aundra Dubin, MD, [place]    . Chronic fatigue 06/08/2016  . Colon polyps 05/2014   adenomatous, colo q 5y  . Conjunctiva disorder 08/06/2018  . CONSTIPATION, CHRONIC   .  Cough 04/25/2016  . Depression 09/03/2016  . Ear canal dryness 01/23/2018  . FATIGUE 06/24/2008   Qualifier: Diagnosis of  By: Wynona Luna   . GERD   . Herpes zoster without complication 99991111  . Hot flashes   . HYPERLIPIDEMIA   . HYPERTENSION   . Hypokalemia 12/10/2014  . Lactose intolerance   . LRTI (lower respiratory tract infection) 09/05/2017  . LUQ cramping 04/21/2017  . Migraine headache   . MIGRAINES, HX OF 09/18/2007   Qualifier: Diagnosis of  By: Danelle Earthly CMA, Darlene    . MS (multiple sclerosis) ([place])  11/03/2015  . Multiple sclerosis ([place])   . Muscle strain 10/21/2016  . OBESITY, TRUNCAL   . OTHER SYMPTOMS INVOLVING DIGESTIVE SYSTEM OTHER 12/14/2009   Qualifier: Diagnosis of  By: Asa Lente MD, Jannifer Rodney   . Otitis, externa, infective 05/24/2016  . PERSONAL HX COLONIC POLYPS 04/21/2009   Qualifier: Diagnosis of  By: Trellis Paganini PA-c, Amy S   . SINUSITIS 01/14/2010   Qualifier: Diagnosis of  By: Asa Lente MD, Jannifer Rodney Upper respiratory tract infection 09/23/2016  . UTI 08/19/2010   Qualifier: Diagnosis of  By: Marca Ancona RMA, Lucy    . Wheezing 09/10/2017   Past Surgical History:  Procedure Laterality Date  . BREAST BIOPSY Right    [place]   . COLONOSCOPY    . MASTECTOMY Right   . PORT-A-CATH REMOVAL Left 05/13/2015   Procedure: REMOVAL PORT-A-CATH;  Surgeon: [name], MD;  Location: [place];  Service: General;  Laterality: Left;  . PORTACATH PLACEMENT Left 11/06/2014   Procedure: INSERTION PORT-A-CATH;  Surgeon: [name], MD;  Location: [place];  Service: General;  Laterality: Left;  . SIMPLE MASTECTOMY WITH AXILLARY SENTINEL NODE BIOPSY Right 05/13/2015   Procedure: RIGHT TOTAL  MASTECTOMY WITH RIGHT  AXILLARY SENTINEL NODE BIOPSY;  Surgeon: [name], MD;  Location: [place];  Service: General;  Laterality: Right;  . TUBAL LIGATION       Current Meds  Medication Sig  . azelastine (ASTELIN) 0.1 % nasal spray azelastine 137 mcg (0.1 %) nasal spray aerosol  . b complex vitamins tablet Take 1 tablet by mouth daily.  [name] COPAXONE 40 MG/ML SOSY INJECT ONE SYRINGE SUBCUTANEOUSLY THREE TIMES PER WEEK AT LEAST 48 HOURS APART. ALLOW SYRINGE TO WARM TO ROOM TEMPERATURE FOR 20 MINUTES. REFRIGERATE.  . Diclofenac Sodium 2 % SOLN Place 2 g onto the skin 2 (two) times daily.  . ergocalciferol (VITAMIN D2) 1.25 MG (50000 UT) capsule ergocalciferol (vitamin D2) 1,250 mcg (50,000 unit) capsule  . ferrous gluconate (FERGON) 324 MG tablet Take 1 tablet (324 mg total) by mouth 2  (two) times daily with a meal. (Patient taking differently: Take 324 mg by mouth 3 (three) times a week. )  . fluticasone (FLONASE) 50 MCG/ACT nasal spray SHAKE LIQUID AND USE 2 SPRAYS IN EACH NOSTRIL DAILY  . gabapentin (NEURONTIN) 100 MG capsule Take 3 capsules (300 mg total) by mouth 3 (three) times daily.  [name] linaclotide (LINZESS) 145 MCG CAPS capsule Take 2 capsules (290 mcg total) by mouth daily.  [name] loratadine (CLARITIN) 10 MG tablet Take 10 mg by mouth daily.  [name] losartan (COZAAR) 50 MG tablet Take 1 tablet (50 mg total) by mouth daily.  . metoprolol tartrate (LOPRESSOR) 50 MG tablet TAKE 1 TABLET(50 MG) BY MOUTH TWICE DAILY  . pantoprazole (PROTONIX) 40 MG tablet TAKE 1 TABLET BY MOUTH EVERY DAY  . Phentermine-Topiramate (QSYMIA) 3.75-23 MG CP24 1  tab qd am x 14 day, then 2 tab q am  . potassium chloride (KLOR-CON 10) 10 MEQ tablet Take 4 tablets (40 mEq total) by mouth daily.  . Probiotic Product (PROBIOTIC PO) Take 2 capsules by mouth daily.  . SYMBICORT 160-4.5 MCG/ACT inhaler INHALE 2 PUFFS INTO THE LUNGS TWICE DAILY (Patient taking differently: Takes as needed)  . TURMERIC PO Take by mouth daily.  . valACYclovir (VALTREX) 500 MG tablet Take 500 mg by mouth 2 (two) times daily.  [name] VITAMIN D PO Take 2,000 Units by mouth daily.  Alveda Reasons 20 MG TABS tablet TAKE 1 TABLET(20 MG) BY MOUTH DAILY WITH SUPPER     Allergies:   Ace inhibitors, Clarithromycin, Contrave [naltrexone-bupropion hcl er], and Levaquin [levofloxacin]   Social History   Tobacco Use  . Smoking status: Never Smoker  . Smokeless tobacco: Never Used  Substance Use Topics  . Alcohol use: No    Alcohol/week: 0.0 standard drinks  . Drug use: No     Family Hx: The patient's family history includes Alcohol abuse in her father; COPD in her maternal grandmother; Coronary artery disease in her mother; Diabetes in her mother; Heart disease in her mother; Hyperlipidemia in an other family member; Hypertension in her mother  and another family member; Stroke in her mother. There is no history of Colon cancer, Rectal cancer, Stomach cancer, or Esophageal cancer.  ROS:   Please see the history of present illness.     All other systems reviewed and are negative.   Labs/Other Tests and Data Reviewed:    Recent Labs: 07/11/2019: Magnesium 1.9 12/08/2019: ALT 17; BUN 15; Creatinine, Ser 0.86; Hemoglobin 12.9; Platelets 271.0; Potassium 3.5; Sodium 137; TSH 1.80   Recent Lipid Panel Lab Results  Component Value Date/Time   CHOL 213 (H) 12/08/2019 04:06 PM   CHOL 204 (H) 02/21/2017 10:27 AM   TRIG 222.0 (H) 12/08/2019 04:06 PM   HDL 50.10 12/08/2019 04:06 PM   HDL 47 02/21/2017 10:27 AM   CHOLHDL 4 12/08/2019 04:06 PM   LDLCALC 121 (H) 10/16/2018 10:12 AM   LDLCALC 121 (H) 02/21/2017 10:27 AM   LDLDIRECT 120.0 12/08/2019 04:06 PM    Wt Readings from Last 3 Encounters:  02/09/20 220 lb (99.8 kg)  01/28/20 221 lb 12.8 oz (100.6 kg)  01/08/20 222 lb 6.4 oz (100.9 kg)     Objective:    Vital Signs:  Ht 5\' 4"  (1.626 m)   Wt 220 lb (99.8 kg)   LMP 11/01/2012   BMI 37.76 kg/m      ASSESSMENT & PLAN:    1.  OSA - The PAP download was reviewed today and showed an AHI of 1.6/hr on 8 cm H2O with 27% compliance in using more than 4 hours nightly.  The patient has been using and benefiting from PAP use and will continue to benefit from therapy.   2.  HTN -BP controlled -continue Lopressor 50mg  BID, HCTZ 25mg  daily and Losartan 50mg  daily  3.  Obesity -she walks Tuesday and thursdays and also walks on the treadmill. -encouraged her to watch her carbs and sugars.   Time:   Today, I have spent 20 minutes on telemedicine discussing medical problems including OSA, HTN, obesity and reviewing patient's chart including PAP compliance download.  Medication Adjustments/Labs and Tests Ordered: Current medicines are reviewed at length with the patient today.  Concerns regarding medicines are outlined above.   Tests Ordered: No orders of the defined types were placed in  this encounter.  Medication Changes: No orders of the defined types were placed in this encounter.   Disposition:  Follow up in 1 year(s)  Signed, Fransico Him, MD  02/09/2020 8:16 AM    [facility]

## 2020-02-09 ENCOUNTER — Telehealth (INDEPENDENT_AMBULATORY_CARE_PROVIDER_SITE_OTHER): Payer: BC Managed Care – PPO | Admitting: Cardiology

## 2020-02-09 ENCOUNTER — Other Ambulatory Visit: Payer: Self-pay

## 2020-02-09 ENCOUNTER — Encounter: Payer: Self-pay | Admitting: Cardiology

## 2020-02-09 VITALS — Ht 64.0 in | Wt 220.0 lb

## 2020-02-09 DIAGNOSIS — G4733 Obstructive sleep apnea (adult) (pediatric): Secondary | ICD-10-CM

## 2020-02-09 DIAGNOSIS — I10 Essential (primary) hypertension: Secondary | ICD-10-CM

## 2020-02-09 DIAGNOSIS — E669 Obesity, unspecified: Secondary | ICD-10-CM

## 2020-02-18 NOTE — Progress Notes (Signed)
Subjective:    Patient ID: [name] [name], female    DOB: 1960/10/10, 59 y.o.   MRN: [id number]  HPI The patient is here for an acute visit.  R ear pain:  The pain is intermittent.  She denies popping and pressure.  It itches at times.  No discharge or change in hearing.  Tylenol helps a little.    She is taking her allergy medications.  She has taken claritin-D and the decongestant has helped, but makes her not feel good.    Medications and allergies reviewed with patient and updated if appropriate.  Patient Active Problem List   Diagnosis Date Noted   Urine frequency 12/22/2019   Right-sided chest wall pain 12/22/2019   AC (acromioclavicular) arthritis 10/09/2019   Acute pain of right shoulder 09/04/2019   Acute sinus infection 08/19/2019   Atrial flutter ([place]) 02/28/2019   RLS (restless legs syndrome) 11/21/2018   Sleep difficulties 08/06/2018   Trigger finger 01/23/2018   Meralgia paraesthetica, right 11/05/2017   Iron deficiency 10/09/2017   Vitamin D deficiency 06/15/2017   Lumbar radiculopathy 04/21/2017   Trigger index finger of left hand 04/21/2017   Morbid obesity ([place]) 04/21/2017   Left hip pain 10/31/2016   Pes planus 10/31/2016   Posterior tibialis muscle dysfunction 10/31/2016   Breast pain, right 08/24/2016   Palpitation 04/04/2016   Hand arthritis 04/04/2016   Malignant neoplasm of upper-outer quadrant of right breast in female, 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[place]) 11/03/2015   S/P mastectomy 05/13/2015   Chemotherapy-induced neuropathy ([place]) 04/21/2015   Genetic testing 12/11/2014   Breast cancer of upper-outer quadrant of right female breast ([place]) 10/23/2014   Allergic rhinitis 11/29/2009   KELOID 11/05/2009   CONSTIPATION, CHRONIC [id number]   METABOLIC SYNDROME X 99991111   GERD 01/07/2008   Essential hypertension 09/20/2007   Multiple sclerosis ([place]) 09/18/2007    Current Outpatient Medications on File Prior  to Visit  Medication Sig Dispense Refill   azelastine (ASTELIN) 0.1 % nasal spray azelastine 137 mcg (0.1 %) nasal spray aerosol 30 mL 3   b complex vitamins tablet Take 1 tablet by mouth daily.     COPAXONE 40 MG/ML SOSY INJECT ONE SYRINGE SUBCUTANEOUSLY THREE TIMES PER WEEK AT LEAST 48 HOURS APART. ALLOW SYRINGE TO WARM TO ROOM TEMPERATURE FOR 20 MINUTES. REFRIGERATE. 36 mL 2   Diclofenac Sodium 2 % SOLN Place 2 g onto the skin 2 (two) times daily. 112 g 3   ergocalciferol (VITAMIN D2) 1.25 MG (50000 UT) capsule ergocalciferol (vitamin D2) 1,250 mcg (50,000 unit) capsule     ferrous gluconate (FERGON) 324 MG tablet Take 1 tablet (324 mg total) by mouth 2 (two) times daily with a meal. (Patient taking differently: Take 324 mg by mouth 3 (three) times a week. ) 180 tablet 3   fluticasone (FLONASE) 50 MCG/ACT nasal spray SHAKE LIQUID AND USE 2 SPRAYS IN EACH NOSTRIL DAILY 16 g 6   gabapentin (NEURONTIN) 100 MG capsule Take 3 capsules (300 mg total) by mouth 3 (three) times daily. 810 capsule 1   linaclotide (LINZESS) 145 MCG CAPS capsule Take 2 capsules (290 mcg total) by mouth daily. 180 capsule 1   loratadine (CLARITIN) 10 MG tablet Take 10 mg by mouth daily.     losartan (COZAAR) 50 MG tablet Take 1 tablet (50 mg total) by mouth daily. 90 tablet 1   metoprolol tartrate (LOPRESSOR) 50 MG tablet TAKE 1 TABLET(50 MG) BY MOUTH TWICE DAILY 180 tablet 1   pantoprazole (  PROTONIX) 40 MG tablet TAKE 1 TABLET BY MOUTH EVERY DAY 90 tablet 1   Phentermine-Topiramate (QSYMIA) 3.75-23 MG CP24 1 tab qd am x 14 day, then 2 tab q am 60 capsule 5   potassium chloride (KLOR-CON 10) 10 MEQ tablet Take 4 tablets (40 mEq total) by mouth daily. 120 tablet 5   Probiotic Product (PROBIOTIC PO) Take 2 capsules by mouth daily.     SYMBICORT 160-4.5 MCG/ACT inhaler INHALE 2 PUFFS INTO THE LUNGS TWICE DAILY (Patient taking differently: Takes as needed) 10.2 g 0   TURMERIC PO Take by mouth daily.      valACYclovir (VALTREX) 500 MG tablet Take 500 mg by mouth 2 (two) times daily.     VITAMIN D PO Take 2,000 Units by mouth daily.     XARELTO 20 MG TABS tablet TAKE 1 TABLET(20 MG) BY MOUTH DAILY WITH SUPPER 30 tablet 1   No current facility-administered medications on file prior to visit.    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   CHEST PAIN-UNSPECIFIED 05/19/2010   Qualifier: Diagnosis of  By: Aundra Dubin, MD, [place]     Chronic fatigue 06/08/2016   Colon polyps 05/2014   adenomatous, colo q 5y   Conjunctiva disorder 08/06/2018   CONSTIPATION, CHRONIC    Cough 04/25/2016   Depression 09/03/2016   Ear canal dryness 01/23/2018   FATIGUE 06/24/2008   Qualifier: Diagnosis of  By: Wynona Luna    GERD    Herpes zoster without complication 99991111   Hot flashes    HYPERLIPIDEMIA    HYPERTENSION    Hypokalemia 12/10/2014   Lactose intolerance    LRTI (lower respiratory tract infection) 09/05/2017   LUQ cramping 04/21/2017   Migraine headache    MIGRAINES, HX OF 09/18/2007   Qualifier: Diagnosis of  By: Danelle Earthly CMA, Darlene     MS (multiple sclerosis) ([place]) 11/03/2015   Multiple sclerosis ([place])    Muscle strain 10/21/2016   OBESITY, TRUNCAL    OTHER SYMPTOMS INVOLVING DIGESTIVE SYSTEM OTHER 12/14/2009   Qualifier: Diagnosis of  By: Asa Lente MD, Valerie A    Otitis, externa, infective 05/24/2016   PERSONAL HX COLONIC POLYPS 04/21/2009   Qualifier: Diagnosis of  By: Shane Crutch, Amy S    SINUSITIS 01/14/2010   Qualifier: Diagnosis of  By: Asa Lente MD, Valerie A     Upper respiratory tract infection 09/23/2016   UTI 08/19/2010   Qualifier: Diagnosis of  By: Marca Ancona RMA, [place]     Wheezing 09/10/2017    Past Surgical History:  Procedure Laterality Date   BREAST BIOPSY Right    [place]    COLONOSCOPY     MASTECTOMY Right    PORT-A-CATH REMOVAL Left 05/13/2015   Procedure: REMOVAL PORT-A-CATH;  Surgeon: [name], MD;  Location: [facility];  Service: General;  Laterality: Left;   PORTACATH PLACEMENT Left 11/06/2014   Procedure: INSERTION PORT-A-CATH;  Surgeon: [name], MD;  Location: [facility];  Service: General;  Laterality: Left;   SIMPLE MASTECTOMY WITH AXILLARY SENTINEL NODE BIOPSY Right 05/13/2015   Procedure: RIGHT TOTAL  MASTECTOMY WITH RIGHT  AXILLARY SENTINEL NODE BIOPSY;  Surgeon: [name], MD;  Location: [facility];  Service: General;  Laterality: Right;   TUBAL LIGATION      Social History   Socioeconomic History   Marital status: Single    Spouse name: Not on file   Number of children: 2   Years of education: 12   Highest education level: Not on file  Occupational History   Occupation: Solicitor: [employer]  Tobacco Use   Smoking status: Never Smoker   Smokeless tobacco: Never Used  Substance and Sexual Activity   Alcohol use: No    Alcohol/week: 0.0 standard drinks   Drug use: No   Sexual activity: Not on file  Other Topics Concern   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    Patient is single and lives alone.    Patient is right handed.    Social Determinants of Health   Financial Resource Strain:    Difficulty of Paying Living Expenses:   Food Insecurity:    Worried About Charity fundraiser in the Last Year:    Arboriculturist in the Last Year:   Transportation Needs:    Film/video editor (Medical):    Lack of Transportation  (Non-Medical):   Physical Activity:    Days of Exercise per Week:    Minutes of Exercise per Session:   Stress:    Feeling of Stress :   Social Connections:    Frequency of Communication with Friends and Family:    Frequency of Social Gatherings with Friends and Family:    Attends Religious Services:    Active Member of Clubs or Organizations:    Attends Music therapist:    Marital Status:     Family History  Problem Relation Age of Onset   Coronary artery disease Mother    Heart disease Mother    Diabetes Mother    Hypertension Mother    Stroke Mother    Alcohol abuse Father    COPD Maternal Grandmother    Hypertension Other    Hyperlipidemia Other    Colon cancer Neg Hx    Rectal cancer Neg Hx    Stomach cancer Neg Hx    Esophageal cancer Neg Hx     Review of Systems  Constitutional: Negative for chills and fever.  HENT: Positive for congestion (mild - discolored mucus), ear pain and sinus pressure (maxillary). Negative for ear discharge, sinus pain and sore throat.   Respiratory: Negative for cough, shortness of breath and wheezing.   Neurological: Negative for dizziness, light-headedness and headaches.       Objective:   Vitals:   02/19/20 0855  BP: 140/78  Pulse: 73  Resp: 16  Temp: 98.8 F (37.1 C)  SpO2: 99%   BP Readings from Last 3 Encounters:  02/19/20 140/78  01/28/20 122/78  01/08/20 116/78   Wt Readings from Last 3 Encounters:  02/19/20 221 lb (100.2 kg)  02/09/20 220 lb (99.8 kg)  01/28/20 221 lb 12.8 oz (100.6 kg)   Body mass index is 37.93 kg/m.   Physical Exam    GENERAL APPEARANCE: Appears stated age, well appearing, NAD EYES: conjunctiva clear, no icterus HEENT: bilateral tympanic membranes and ear canals normal, oropharynx with no erythema, maxillary sinus tenderness, no thyromegaly, trachea midline, no cervical or  supraclavicular lymphadenopathy LUNGS: Clear to auscultation without wheeze or  crackles, unlabored breathing, good air entry bilaterally CARDIOVASCULAR: Normal S1,S2 without murmurs, no edema SKIN: Warm, dry      Assessment & Plan:    See Problem List for Assessment and Plan of chronic medical problems.    This visit occurred during the SARS-CoV-2 public health emergency.  Safety protocols were in place, including screening questions prior to the visit, additional usage of staff PPE, and extensive cleaning of exam room while observing appropriate contact time as indicated for disinfecting solutions.

## 2020-02-19 ENCOUNTER — Ambulatory Visit (INDEPENDENT_AMBULATORY_CARE_PROVIDER_SITE_OTHER): Payer: BC Managed Care – PPO | Admitting: Internal Medicine

## 2020-02-19 ENCOUNTER — Other Ambulatory Visit: Payer: Self-pay

## 2020-02-19 ENCOUNTER — Encounter: Payer: Self-pay | Admitting: Internal Medicine

## 2020-02-19 DIAGNOSIS — J0100 Acute maxillary sinusitis, unspecified: Secondary | ICD-10-CM | POA: Diagnosis not present

## 2020-02-19 MED ORDER — FLUCONAZOLE 150 MG PO TABS
150.0000 mg | ORAL_TABLET | Freq: Once | ORAL | 0 refills | Status: AC
Start: 2020-02-19 — End: 2020-02-19

## 2020-02-19 MED ORDER — AMOXICILLIN-POT CLAVULANATE 875-125 MG PO TABS
1.0000 | ORAL_TABLET | Freq: Two times a day (BID) | ORAL | 0 refills | Status: DC
Start: 2020-02-19 — End: 2020-03-08

## 2020-02-19 NOTE — Patient Instructions (Signed)
Sinusitis, Adult Sinusitis is inflammation of your sinuses. Sinuses are hollow spaces in the bones around your face. Your sinuses are located:  Around your eyes.  In the middle of your forehead.  Behind your nose.  In your cheekbones. Mucus normally drains out of your sinuses. When your nasal tissues become inflamed or swollen, mucus can become trapped or blocked. This allows bacteria, viruses, and fungi to grow, which leads to infection. Most infections of the sinuses are caused by a virus. Sinusitis can develop quickly. It can last for up to 4 weeks (acute) or for more than 12 weeks (chronic). Sinusitis often develops after a cold. What are the causes? This condition is caused by anything that creates swelling in the sinuses or stops mucus from draining. This includes:  Allergies.  Asthma.  Infection from bacteria or viruses.  Deformities or blockages in your nose or sinuses.  Abnormal growths in the nose (nasal polyps).  Pollutants, such as chemicals or irritants in the air.  Infection from fungi (rare). What increases the risk? You are more likely to develop this condition if you:  Have a weak body defense system (immune system).  Do a lot of swimming or diving.  Overuse nasal sprays.  Smoke. What are the signs or symptoms? The main symptoms of this condition are pain and a feeling of pressure around the affected sinuses. Other symptoms include:  Stuffy nose or congestion.  Thick drainage from your nose.  Swelling and warmth over the affected sinuses.  Headache.  Upper toothache.  A cough that may get worse at night.  Extra mucus that collects in the throat or the back of the nose (postnasal drip).  Decreased sense of smell and taste.  Fatigue.  A fever.  Sore throat.  Bad breath. How is this diagnosed? This condition is diagnosed based on:  Your symptoms.  Your medical history.  A physical exam.  Tests to find out if your condition is  acute or chronic. This may include: ? Checking your nose for nasal polyps. ? Viewing your sinuses using a device that has a light (endoscope). ? Testing for allergies or bacteria. ? Imaging tests, such as an MRI or CT scan. In rare cases, a bone biopsy may be done to rule out more serious types of fungal sinus disease. How is this treated? Treatment for sinusitis depends on the cause and whether your condition is chronic or acute.  If caused by a virus, your symptoms should go away on their own within 10 days. You may be given medicines to relieve symptoms. They include: ? Medicines that shrink swollen nasal passages (topical intranasal decongestants). ? Medicines that treat allergies (antihistamines). ? A spray that eases inflammation of the nostrils (topical intranasal corticosteroids). ? Rinses that help get rid of thick mucus in your nose (nasal saline washes).  If caused by bacteria, your health care provider may recommend waiting to see if your symptoms improve. Most bacterial infections will get better without antibiotic medicine. You may be given antibiotics if you have: ? A severe infection. ? A weak immune system.  If caused by narrow nasal passages or nasal polyps, you may need to have surgery. Follow these instructions at home: Medicines  Take, use, or apply over-the-counter and prescription medicines only as told by your health care provider. These may include nasal sprays.  If you were prescribed an antibiotic medicine, take it as told by your health care provider. Do not stop taking the antibiotic even if you start   to feel better. Hydrate and humidify   Drink enough fluid to keep your urine pale yellow. Staying hydrated will help to thin your mucus.  Use a cool mist humidifier to keep the humidity level in your home above 50%.  Inhale steam for 10-15 minutes, 3-4 times a day, or as told by your health care provider. You can do this in the bathroom while a hot shower is  running.  Limit your exposure to cool or dry air. Rest  Rest as much as possible.  Sleep with your head raised (elevated).  Make sure you get enough sleep each night. General instructions   Apply a warm, moist washcloth to your face 3-4 times a day or as told by your health care provider. This will help with discomfort.  Wash your hands often with soap and water to reduce your exposure to germs. If soap and water are not available, use hand sanitizer.  Do not smoke. Avoid being around people who are smoking (secondhand smoke).  Keep all follow-up visits as told by your health care provider. This is important. Contact a health care provider if:  You have a fever.  Your symptoms get worse.  Your symptoms do not improve within 10 days. Get help right away if:  You have a severe headache.  You have persistent vomiting.  You have severe pain or swelling around your face or eyes.  You have vision problems.  You develop confusion.  Your neck is stiff.  You have trouble breathing. Summary  Sinusitis is soreness and inflammation of your sinuses. Sinuses are hollow spaces in the bones around your face.  This condition is caused by nasal tissues that become inflamed or swollen. The swelling traps or blocks the flow of mucus. This allows bacteria, viruses, and fungi to grow, which leads to infection.  If you were prescribed an antibiotic medicine, take it as told by your health care provider. Do not stop taking the antibiotic even if you start to feel better.  Keep all follow-up visits as told by your health care provider. This is important. This information is not intended to replace advice given to you by your health care provider. Make sure you discuss any questions you have with your health care provider. Document Revised: 02/04/2018 Document Reviewed: 02/04/2018 Elsevier Patient Education  2020 [employer]  

## 2020-02-19 NOTE — Assessment & Plan Note (Signed)
Likely bacterial  Start augmentin otc cold/allergy medications Rest, fluid Call if no improvement

## 2020-03-08 ENCOUNTER — Encounter: Payer: Self-pay | Admitting: Internal Medicine

## 2020-03-08 MED ORDER — CEFDINIR 300 MG PO CAPS
300.0000 mg | ORAL_CAPSULE | Freq: Two times a day (BID) | ORAL | 0 refills | Status: DC
Start: 2020-03-08 — End: 2020-04-23

## 2020-03-10 MED ORDER — FLUCONAZOLE 150 MG PO TABS
150.0000 mg | ORAL_TABLET | Freq: Once | ORAL | 0 refills | Status: AC
Start: 2020-03-10 — End: 2020-03-10

## 2020-03-10 NOTE — Addendum Note (Signed)
Addended by: Binnie Rail on: 03/10/2020 04:05 PM   Modules accepted: Orders

## 2020-03-15 ENCOUNTER — Other Ambulatory Visit: Payer: Self-pay | Admitting: Internal Medicine

## 2020-03-24 ENCOUNTER — Telehealth: Payer: Self-pay | Admitting: *Deleted

## 2020-03-24 MED ORDER — PHENTERMINE HCL 37.5 MG PO CAPS
37.5000 mg | ORAL_CAPSULE | ORAL | 0 refills | Status: DC
Start: 2020-03-24 — End: 2020-04-23

## 2020-03-24 NOTE — Progress Notes (Signed)
[place]  [phone] [phone]     ID: [name] DOB: 04-06-1961  MR#: [id number]  WEX#:[id number]  Patient Care Team: Binnie Rail, MD as PCP - General (Internal Medicine) Sueanne Margarita, MD as PCP - Cardiology (Cardiology) [name], MD as Consulting Physician (Obstetrics and Gynecology) [name], MD (Gastroenterology) Ellwood Dense., MD (Neurology) [name], MD as Consulting Physician (General Surgery) Magrinat, Virgie Dad, MD as Consulting Physician (Oncology) [name], MD as Attending Physician (Radiation Oncology) [name], RN as Registered Nurse [name], RN as Registered Nurse Jake Shark, Johny Blamer, NP as Nurse Practitioner (Nurse Practitioner) Lyndal Pulley, DO as Attending Physician (Family Medicine) OTHER MD:  CHIEF COMPLAINT: Triple negative breast cancer (s/p right mastectomy)  CURRENT TREATMENT: Observation   INTERVAL HISTORY: [name] [name] returns today for follow-up of her triple negative breast cancer. She continues under observation.   Since her last visit, she underwent left screening mammography with tomography at [facility] on 11/03/2019 showing: breast density category C; no evidence of malignancy.   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  This causes her to have a little bit of a runny nose but no cough, no pleurisy, no phlegm production.  She is on various upper respiratory medications including Symbicort ST 11 and Flonase as well as Claritin.  She exercises by walking about an hour with a friend twice a week which is terrific.  Aside from these issues a detailed review of systems today was stable    BREAST CANCER HISTORY: From the original intake note:  [name] [name] had bilateral screening mammography at [facility] 11/21/2013. Breast density was category C. Exam was unremarkable. In January 2016 however the patient palpated a change in her  right breast laterally. She contacted her gynecologist, Dr. [name], and he set her up for a unilateral right diagnostic mammography with right breast ultrasonography at [facility] 10/15/2014. There was a focal irregular opacity in the lateral right breast associated with pleomorphic calcifications. This was palpable on exam as a firm non-mobile mass. Ultrasound confirmed a hypoechoic mass in the right breast at the 9:00 position 5 cm from the nipple, measuring 2.9 cm. Next to this mass was a normal-sized intramammary lymph node measuring 4 mm. In the right axilla there were 3 contiguous level I lymph nodes the largest measuring 11 mm.  On 10/20/2014 the patient underwent right core needle biopsy of the 9:00 breast mass, showing ([id number]) and invasive ductal carcinoma, grade 3, triple negative, with the HER-2/neu signals ratio of 1.26 and number per cell 2.15. The MIB-1 was 71%. Biopsy of the largest right axillary lymph node was negative. Dr. Owens Shark the mammographer who performed a biopsy describes this is concordant.  Left breast mammography 10/26/2014 was negative, and bilateral breast MRI the same day confirmed, in the right breast, and upper outer quadrant mass measuring 2.5 cm, with a second mass measuring 1.4 cm slightly anterior and superior to it. In addition there was a total area of approximately 6.5 cm of non-masslike enhancement extending throughout the upper outer quadrant of the right breast. There was a cortically thickened right axillary lymph node which had been biopsied. There was also a mildly cortically thickened right subpectoral lymph node. There were no internal mammary or left axillary lymph nodes in the left breast was unremarkable.  The patient's subsequent history is as detailed below.   PAST MEDICAL HISTORY: Past Medical History:  Diagnosis  Date  . Acute non-recurrent maxillary sinusitis 01/08/2017  . Allergic rhinitis due to other allergen   . Allergy   .  Angioedema    ACEI  . 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 Anxiety   . Breast cancer of upper-outer quadrant of right female breast ([place]) 10/23/2014   s/p r mastectomy, neoadj chemo completed 04/21/15; neg genetic testing  . 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  . CHEST PAIN-UNSPECIFIED 05/19/2010   Qualifier: Diagnosis of  By: Aundra Dubin, MD, [place]    . Chronic fatigue 06/08/2016  . Colon polyps 05/2014   adenomatous, colo q 5y  . Conjunctiva disorder 08/06/2018  . CONSTIPATION, CHRONIC   . Cough 04/25/2016  . Depression 09/03/2016  . Ear canal dryness 01/23/2018  . FATIGUE 06/24/2008   Qualifier: Diagnosis of  By: Wynona Luna   . GERD   . Herpes zoster without complication 9/70/2637  . Hot flashes   . HYPERLIPIDEMIA   . HYPERTENSION   . Hypokalemia 12/10/2014  . Lactose intolerance   . LRTI (lower respiratory tract infection) 09/05/2017  . LUQ cramping 04/21/2017  . Migraine headache   . MIGRAINES, HX OF 09/18/2007   Qualifier: Diagnosis of  By: Danelle Earthly CMA, Darlene    . MS (multiple sclerosis) ([place]) 11/03/2015  . Multiple sclerosis ([place])   . Muscle strain 10/21/2016  . OBESITY, TRUNCAL   . OTHER SYMPTOMS INVOLVING DIGESTIVE SYSTEM OTHER 12/14/2009   Qualifier: Diagnosis of  By: Asa Lente MD, Jannifer Rodney   . Otitis, externa, infective 05/24/2016  . PERSONAL HX COLONIC POLYPS 04/21/2009   Qualifier: Diagnosis of  By: Trellis Paganini PA-c, Amy S   . SINUSITIS 01/14/2010   Qualifier: Diagnosis of  By: Asa Lente MD, Jannifer Rodney Upper respiratory tract infection 09/23/2016  . UTI 08/19/2010   Qualifier: Diagnosis of  By: Marca Ancona RMA, Lucy    . Wheezing 09/10/2017    PAST SURGICAL HISTORY: Past Surgical History:  Procedure Laterality Date  . BREAST BIOPSY Right    [place]   . COLONOSCOPY    . MASTECTOMY Right   . PORT-A-CATH REMOVAL Left 05/13/2015   Procedure: REMOVAL PORT-A-CATH;  Surgeon: [name], MD;  Location: [facility];  Service: General;  Laterality: Left;  .  PORTACATH PLACEMENT Left 11/06/2014   Procedure: INSERTION PORT-A-CATH;  Surgeon: [name], MD;  Location: [facility];  Service: General;  Laterality: Left;  . SIMPLE MASTECTOMY WITH AXILLARY SENTINEL NODE BIOPSY Right 05/13/2015   Procedure: RIGHT TOTAL  MASTECTOMY WITH RIGHT  AXILLARY SENTINEL NODE BIOPSY;  Surgeon: [name], MD;  Location: [facility];  Service: General;  Laterality: Right;  . TUBAL LIGATION      FAMILY HISTORY Family History  Problem Relation Age of Onset  . Coronary artery disease Mother   . Heart disease Mother   . Diabetes Mother   . Hypertension Mother   . Stroke Mother   . Alcohol abuse Father   . COPD Maternal Grandmother   . Hypertension Other   . Hyperlipidemia Other   . Colon cancer Neg Hx   . Rectal cancer Neg Hx   . Stomach cancer Neg Hx   . Esophageal cancer Neg Hx    the patient's father died at the age of 70 following his seizure. The patient's mother died from congestive heart failure at the age of 54. The patient had 5 brothers and 2 sisters. One brother died from Escherichia coli infection, the other one had a history of seizure  disorder. One sister died from causes unknown to the patient. There is no history of breast or ovarian cancer in the family.   GYNECOLOGIC HISTORY:  Patient's last menstrual period was 11/01/2012. Menarche age 52, first live birth age 60. The patient is GX P2. She stopped having periods in May 2014. She did not take hormone replacement. She did take her control pills remotely for approximately 4 years with no complications.   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 She describes herself is single, and lives by herself. Her daughter [name] lives in [place] and works in Ambulance person for [employer]. Son [place] "[name]" Poster is an Engineer, building services for [employer]. The patient has 1 grand son who graduated  from high school June 2017 and tells me she has a great grandson on the way    ADVANCED DIRECTIVES: Not in place   HEALTH MAINTENANCE: Social History   Tobacco Use  . Smoking status: Never Smoker  . Smokeless tobacco: Never Used  Vaping Use  . Vaping Use: Never used  Substance Use Topics  . Alcohol use: No    Alcohol/week: 0.0 standard drinks  . Drug use: No     Colonoscopy: September 2015/[name]  PAP: February 2015  Bone density:  Lipid panel:  Allergies  Allergen Reactions  . Ace Inhibitors Anaphylaxis     Angioedema (tongue swelling)  . Clarithromycin Anaphylaxis    Throat swells  . Contrave [Naltrexone-Bupropion Hcl Er]     Chest and sob  . Levaquin [Levofloxacin] Other (See Comments)    Tendon pain - shoulder and calf    Current Outpatient Medications  Medication Sig Dispense Refill  . azelastine (ASTELIN) 0.1 % nasal spray azelastine 137 mcg (0.1 %) nasal spray aerosol 30 mL 3  . b complex vitamins tablet Take 1 tablet by mouth daily.    . cefdinir (OMNICEF) 300 MG capsule Take 1 capsule (300 mg total) by mouth 2 (two) times daily. 20 capsule 0  . COPAXONE 40 MG/ML SOSY INJECT ONE SYRINGE SUBCUTANEOUSLY THREE TIMES PER WEEK AT LEAST 48 HOURS APART. ALLOW SYRINGE TO WARM TO ROOM TEMPERATURE FOR 20 MINUTES. REFRIGERATE. 36 mL 2  . Diclofenac Sodium 2 % SOLN Place 2 g onto the skin 2 (two) times daily. 112 g 3  . ergocalciferol (VITAMIN D2) 1.25 MG (50000 UT) capsule ergocalciferol (vitamin D2) 1,250 mcg (50,000 unit) capsule    . ferrous gluconate (FERGON) 324 MG tablet Take 1 tablet (324 mg total) by mouth 2 (two) times daily with a meal. (Patient taking differently: Take 324 mg by mouth 3 (three) times a week. ) 180 tablet 3  . fluticasone (FLONASE) 50 MCG/ACT nasal spray SHAKE LIQUID AND USE 2 SPRAYS IN EACH NOSTRIL DAILY 16 g 6  . gabapentin (NEURONTIN) 100 MG capsule Take 3 capsules (300 mg total) by mouth 3 (three) times daily. 810 capsule 1  . linaclotide  (LINZESS) 145 MCG CAPS capsule Take 2 capsules (290 mcg total) by mouth daily. 180 capsule 1  . loratadine (CLARITIN) 10 MG tablet Take 10 mg by mouth daily.    [name] losartan (COZAAR) 50 MG tablet Take 1 tablet (50 mg total) by mouth daily. 90 tablet 1  . metoprolol tartrate (LOPRESSOR) 50 MG tablet TAKE 1 TABLET(50 MG) BY MOUTH TWICE DAILY 180 tablet 1  . pantoprazole (PROTONIX) 40 MG tablet TAKE 1 TABLET BY MOUTH EVERY DAY 90 tablet 1  .  phentermine 37.5 MG capsule Take 1 capsule (37.5 mg total) by mouth every morning. 30 capsule 0  . potassium chloride (KLOR-CON 10) 10 MEQ tablet Take 4 tablets (40 mEq total) by mouth daily. 120 tablet 5  . Probiotic Product (PROBIOTIC PO) Take 2 capsules by mouth daily.    . SYMBICORT 160-4.5 MCG/ACT inhaler INHALE 2 PUFFS INTO THE LUNGS TWICE DAILY (Patient taking differently: Takes as needed) 10.2 g 0  . TURMERIC PO Take by mouth daily.    . valACYclovir (VALTREX) 500 MG tablet Take 500 mg by mouth 2 (two) times daily.    [name] VITAMIN D PO Take 2,000 Units by mouth daily.    Alveda Reasons 20 MG TABS tablet TAKE 1 TABLET(20 MG) BY MOUTH DAILY WITH SUPPER 90 tablet 1   No current facility-administered medications for this visit.    OBJECTIVE:  African-American woman in no acute distress  Vitals:   03/25/20 0900  BP: 129/81  [name]: 74  Resp: 20  Temp: 98.7 F (37.1 C)  SpO2: 98%     Body mass index is 37.61 kg/m.    ECOG FS:1 - Symptomatic but completely ambulatory   Sclerae unicteric, EOMs intact Wearing a mask No cervical or supraclavicular adenopathy Lungs no rales or rhonchi Heart regular rate and rhythm Abd soft, nontender, positive bowel sounds MSK no focal spinal tenderness, no upper extremity lymphedema Neuro: nonfocal, well oriented, appropriate affect Breasts: The right breast is status post mastectomy.  There is no evidence of chest wall recurrence.  The left breast is benign.  Both axillae are benign.  LAB RESULTS:  CMP     Component  Value Date/Time   NA 142 03/25/2020 0843   NA 140 07/21/2019 0957   NA 142 04/05/2017 1040   K 4.8 03/25/2020 0843   K 4.5 04/05/2017 1040   CL 105 03/25/2020 0843   CO2 27 03/25/2020 0843   CO2 25 04/05/2017 1040   GLUCOSE 100 (H) 03/25/2020 0843   GLUCOSE 89 04/05/2017 1040   BUN 10 03/25/2020 0843   BUN 13 07/21/2019 0957   BUN 18.6 04/05/2017 1040   CREATININE 0.91 03/25/2020 0843   CREATININE 1.0 04/05/2017 1040   CALCIUM 10.3 03/25/2020 0843   CALCIUM 10.2 04/05/2017 1040   PROT 7.9 03/25/2020 0843   PROT 7.7 04/05/2017 1040   ALBUMIN 4.2 03/25/2020 0843   ALBUMIN 4.2 04/05/2017 1040   AST 17 03/25/2020 0843   AST 17 04/05/2017 1040   ALT 21 03/25/2020 0843   ALT 17 04/05/2017 1040   ALKPHOS 81 03/25/2020 0843   ALKPHOS 101 04/05/2017 1040   BILITOT 0.5 03/25/2020 0843   BILITOT 0.56 04/05/2017 1040   GFRNONAA >60 03/25/2020 0843   GFRAA >60 03/25/2020 0843    INo results found for: SPEP, UPEP  Lab Results  Component Value Date   WBC 5.8 03/25/2020   NEUTROABS 2.8 03/25/2020   HGB 13.2 03/25/2020   HCT 38.9 03/25/2020   MCV 87.4 03/25/2020   PLT 275 03/25/2020      Chemistry      Component Value Date/Time   NA 142 03/25/2020 0843   NA 140 07/21/2019 0957   NA 142 04/05/2017 1040   K 4.8 03/25/2020 0843   K 4.5 04/05/2017 1040   CL 105 03/25/2020 0843   CO2 27 03/25/2020 0843   CO2 25 04/05/2017 1040   BUN 10 03/25/2020 0843   BUN 13 07/21/2019 0957   BUN 18.6 04/05/2017 1040  CREATININE 0.91 03/25/2020 0843   CREATININE 1.0 04/05/2017 1040      Component Value Date/Time   CALCIUM 10.3 03/25/2020 0843   CALCIUM 10.2 04/05/2017 1040   ALKPHOS 81 03/25/2020 0843   ALKPHOS 101 04/05/2017 1040   AST 17 03/25/2020 0843   AST 17 04/05/2017 1040   ALT 21 03/25/2020 0843   ALT 17 04/05/2017 1040   BILITOT 0.5 03/25/2020 0843   BILITOT 0.56 04/05/2017 1040       No results found for: LABCA2  No components found for: LABCA125  No results  for input(s): INR in the last 168 hours.  Urinalysis    Component Value Date/Time   COLORURINE YELLOW 12/22/2019 1517   APPEARANCEUR CLEAR 12/22/2019 1517   LABSPEC 1.020 12/22/2019 1517   PHURINE 5.5 12/22/2019 1517   GLUCOSEU NEGATIVE 12/22/2019 1517   HGBUR NEGATIVE 12/22/2019 1517   HGBUR negative 08/19/2010 1041   BILIRUBINUR NEGATIVE 12/22/2019 1517   KETONESUR NEGATIVE 12/22/2019 1517   UROBILINOGEN 0.2 12/22/2019 1517   NITRITE NEGATIVE 12/22/2019 1517   LEUKOCYTESUR NEGATIVE 12/22/2019 1517    STUDIES: No results found.   ASSESSMENT: 59 y.o. [place] woman status post left breast upper outer quadrant biopsy 10/20/2014 for a clinical T2 N0, stage IIA invasive ductal carcinoma, grade 3, triple negative, with an MIB-1 of 71%  (a) right axillary lymph node biopsy negative 10/30/2014  (b) biopsy of posterior extent of right breast calcifications show ductal carcinoma is situ, high grade, with microinvasion  (1) genetics testing through the Breast High/Moderate Risk gene panel offered by [facility] found no deleterious mutations in ATM, BRCA1, BRCA2, CDH1, CHEK2, PALB2, PTEN, STK11, or TP53.   2) neoadjuvant chemotherapy consisting of cyclophosphamide and doxorubicin in dose dense fashion 4 given with Neulasta support, completed 01/06/2015, followed by weekly carboplatin and paclitaxel 12 completed 04/21/2015  (3) status post right mastectomy and sentinel lymph node sampling 05/13/2015 for a ypT1c ypN0 invasive ductal carcinoma, high-grade, with ample margins.  (4) adjuvant radiation not necessary   PLAN: [name] [name] is now a little over 5 years out from definitive surgery for her breast cancer with no evidence of disease recurrence.  This is very favorable.  Especially with triple negative cases like hers, if recurrence is going to occur, it tends to recur early, within the first 2 or 3 years.  Accordingly at this point I feel comfortable releasing her to her primary care  physician's care.  All she will need in terms of breast cancer follow-up is a yearly left screening mammography and yearly physician breast and chest wall exam  I will be glad to see [name] [name] again at any point in the future if and when the need arises but as of now are making no further routine appointments for her here  Total encounter time 20 minutes.   Magrinat, Virgie Dad, MD  03/25/20 9:32 AM Medical Oncology and Hematology [facility] [place], [place] [address] Tel. [phone]    Fax. [phone]   I, Wilburn Mylar, am acting as scribe for Dr. Virgie Dad. Magrinat.  I, Lurline Del MD, have reviewed the above documentation for accuracy and completeness, and I agree with the above.   *Total Encounter Time as defined by the Centers for Medicare and Medicaid Services includes, in addition to the face-to-face time of a patient visit (documented in the note above) non-face-to-face time: obtaining and reviewing outside history, ordering and reviewing medications, tests or procedures, care coordination (communications with other health  care professionals or caregivers) and documentation in the medical record.

## 2020-03-24 NOTE — Telephone Encounter (Signed)
Phentermine sent to pharmacy for her to try again.

## 2020-03-24 NOTE — Telephone Encounter (Signed)
Rec'd call pt states her insurance will not approve the Qsymia. Requesting something else. She states she took Phentermine in the past and will try again.Marland KitchenJohny Chess

## 2020-03-24 NOTE — Telephone Encounter (Signed)
Notified pt w/MD response.../lmb 

## 2020-03-25 ENCOUNTER — Encounter: Payer: Self-pay | Admitting: Oncology

## 2020-03-25 ENCOUNTER — Inpatient Hospital Stay (HOSPITAL_BASED_OUTPATIENT_CLINIC_OR_DEPARTMENT_OTHER): Payer: BC Managed Care – PPO | Admitting: Oncology

## 2020-03-25 ENCOUNTER — Inpatient Hospital Stay: Payer: BC Managed Care – PPO | Attending: Oncology

## 2020-03-25 ENCOUNTER — Other Ambulatory Visit: Payer: Self-pay

## 2020-03-25 VITALS — BP 129/81 | HR 74 | Temp 98.7°F | Resp 20 | Ht 64.0 in | Wt 219.1 lb

## 2020-03-25 DIAGNOSIS — G35 Multiple sclerosis: Secondary | ICD-10-CM

## 2020-03-25 DIAGNOSIS — Z170 Estrogen receptor positive status [ER+]: Secondary | ICD-10-CM

## 2020-03-25 DIAGNOSIS — Z9221 Personal history of antineoplastic chemotherapy: Secondary | ICD-10-CM | POA: Diagnosis not present

## 2020-03-25 DIAGNOSIS — I10 Essential (primary) hypertension: Secondary | ICD-10-CM

## 2020-03-25 DIAGNOSIS — C50411 Malignant neoplasm of upper-outer quadrant of right female breast: Secondary | ICD-10-CM

## 2020-03-25 DIAGNOSIS — Z853 Personal history of malignant neoplasm of breast: Secondary | ICD-10-CM | POA: Insufficient documentation

## 2020-03-25 LAB — CBC WITH DIFFERENTIAL (CANCER CENTER ONLY)
Abs Immature Granulocytes: 0.03 10*3/uL (ref 0.00–0.07)
Basophils Absolute: 0 10*3/uL (ref 0.0–0.1)
Basophils Relative: 0 %
Eosinophils Absolute: 0.1 10*3/uL (ref 0.0–0.5)
Eosinophils Relative: 2 %
HCT: 38.9 % (ref 36.0–46.0)
Hemoglobin: 13.2 g/dL (ref 12.0–15.0)
Immature Granulocytes: 1 %
Lymphocytes Relative: 40 %
Lymphs Abs: 2.3 10*3/uL (ref 0.7–4.0)
MCH: 29.7 pg (ref 26.0–34.0)
MCHC: 33.9 g/dL (ref 30.0–36.0)
MCV: 87.4 fL (ref 80.0–100.0)
Monocytes Absolute: 0.6 10*3/uL (ref 0.1–1.0)
Monocytes Relative: 10 %
Neutro Abs: 2.8 10*3/uL (ref 1.7–7.7)
Neutrophils Relative %: 47 %
Platelet Count: 275 10*3/uL (ref 150–400)
RBC: 4.45 MIL/uL (ref 3.87–5.11)
RDW: 13.9 % (ref 11.5–15.5)
WBC Count: 5.8 10*3/uL (ref 4.0–10.5)
nRBC: 0 % (ref 0.0–0.2)

## 2020-03-25 LAB — CMP (CANCER CENTER ONLY)
ALT: 21 U/L (ref 0–44)
AST: 17 U/L (ref 15–41)
Albumin: 4.2 g/dL (ref 3.5–5.0)
Alkaline Phosphatase: 81 U/L (ref 38–126)
Anion gap: 10 (ref 5–15)
BUN: 10 mg/dL (ref 6–20)
CO2: 27 mmol/L (ref 22–32)
Calcium: 10.3 mg/dL (ref 8.9–10.3)
Chloride: 105 mmol/L (ref 98–111)
Creatinine: 0.91 mg/dL (ref 0.44–1.00)
GFR, Est AFR Am: >60 mL/min (ref >60)
GFR, Estimated: >60 mL/min (ref >60)
Glucose, Bld: 100 mg/dL — ABNORMAL HIGH (ref 70–99)
Potassium: 4.8 mmol/L (ref 3.5–5.1)
Sodium: 142 mmol/L (ref 135–145)
Total Bilirubin: 0.5 mg/dL (ref 0.3–1.2)
Total Protein: 7.9 g/dL (ref 6.5–8.1)

## 2020-03-26 ENCOUNTER — Telehealth: Payer: Self-pay | Admitting: Nurse Practitioner

## 2020-03-26 NOTE — Telephone Encounter (Signed)
No 7/8 los. No changes made to pt's schedule.

## 2020-04-01 ENCOUNTER — Encounter: Payer: Self-pay | Admitting: Internal Medicine

## 2020-04-08 ENCOUNTER — Encounter: Payer: Self-pay | Admitting: Neurology

## 2020-04-22 NOTE — Progress Notes (Signed)
Subjective:    Patient ID: [name] [name], female    DOB: 14-Feb-1961, 59 y.o.   MRN: [id number]  HPI The patient is here for an acute visit.  Headache: it started two weeks go.  It is around her left eye and near her right eye.  She takes tylenol and claritin D as needed.  She take flonase and astelin.    She thinks it is her sinuses.     She has PND and early morning coughing up of orange red colored mucus.     She has frequent sinus infections.  She also has itching in her right ear.    Medications and allergies reviewed with patient and updated if appropriate.  Patient Active Problem List   Diagnosis Date Noted  . Urine frequency 12/22/2019  . Right-sided chest wall pain 12/22/2019  . AC (acromioclavicular) arthritis 10/09/2019  . Acute pain of right shoulder 09/04/2019  . Acute sinus infection 08/19/2019  . Atrial flutter ([place]) 02/28/2019  . RLS (restless legs syndrome) 11/21/2018  . Sleep difficulties 08/06/2018  . Trigger finger 01/23/2018  . Meralgia paraesthetica, right 11/05/2017  . Iron deficiency 10/09/2017  . Vitamin D deficiency 06/15/2017  . Lumbar radiculopathy 04/21/2017  . Trigger index finger of left hand 04/21/2017  . Morbid obesity ([place]) 04/21/2017  . Left hip pain 10/31/2016  . Pes planus 10/31/2016  . Posterior tibialis muscle dysfunction 10/31/2016  . Breast pain, right 08/24/2016  . Palpitation 04/04/2016  . 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 Malignant neoplasm of upper-outer quadrant of right breast in female, 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[place]) 11/03/2015  . S/P mastectomy 05/13/2015  . Chemotherapy-induced neuropathy ([place]) 04/21/2015  . Genetic testing 12/11/2014  . Breast cancer of upper-outer quadrant of right female breast ([place]) 10/23/2014  . Allergic rhinitis 11/29/2009  . 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 CONSTIPATION, CHRONIC 08/17/2008  . METABOLIC SYNDROME X 60/63/0160  . GERD 01/07/2008  . Essential hypertension 09/20/2007  . Multiple  sclerosis ([place]) 09/18/2007    Current Outpatient Medications on File Prior to Visit  Medication Sig Dispense Refill  . azelastine (ASTELIN) 0.1 % nasal spray azelastine 137 mcg (0.1 %) nasal spray aerosol 30 mL 3  . b complex vitamins tablet Take 1 tablet by mouth daily.    [name] COPAXONE 40 MG/ML SOSY INJECT ONE SYRINGE SUBCUTANEOUSLY THREE TIMES PER WEEK AT LEAST 48 HOURS APART. ALLOW SYRINGE TO WARM TO ROOM TEMPERATURE FOR 20 MINUTES. REFRIGERATE. 36 mL 2  . Diclofenac Sodium 2 % SOLN Place 2 g onto the skin 2 (two) times daily. 112 g 3  . ergocalciferol (VITAMIN D2) 1.25 MG (50000 UT) capsule ergocalciferol (vitamin D2) 1,250 mcg (50,000 unit) capsule    . ferrous gluconate (FERGON) 324 MG tablet Take 1 tablet (324 mg total) by mouth 2 (two) times daily with a meal. (Patient taking differently: Take 324 mg by mouth 3 (three) times a week. ) 180 tablet 3  . fluticasone (FLONASE) 50 MCG/ACT nasal spray SHAKE LIQUID AND USE 2 SPRAYS IN EACH NOSTRIL DAILY 16 g 6  . gabapentin (NEURONTIN) 100 MG capsule Take 3 capsules (300 mg total) by mouth 3 (three) times daily. 810 capsule 1  . hydrochlorothiazide (HYDRODIURIL) 25 MG tablet Take 25 mg by mouth daily.    [name] linaclotide (LINZESS) 145 MCG CAPS capsule Take 2 capsules (290 mcg total) by mouth daily. 180 capsule 1  . loratadine (CLARITIN) 10 MG tablet Take 10 mg by mouth daily.    [name] losartan (COZAAR) 50  MG tablet Take 1 tablet (50 mg total) by mouth daily. 90 tablet 1  . metoprolol tartrate (LOPRESSOR) 50 MG tablet TAKE 1 TABLET(50 MG) BY MOUTH TWICE DAILY 180 tablet 1  . pantoprazole (PROTONIX) 40 MG tablet TAKE 1 TABLET BY MOUTH EVERY DAY 90 tablet 1  . potassium chloride (KLOR-CON 10) 10 MEQ tablet Take 4 tablets (40 mEq total) by mouth daily. 120 tablet 5  . Probiotic Product (PROBIOTIC PO) Take 2 capsules by mouth daily.    . SYMBICORT 160-4.5 MCG/ACT inhaler INHALE 2 PUFFS INTO THE LUNGS TWICE DAILY (Patient taking differently: Takes as needed)  10.2 g 0  . TURMERIC PO Take by mouth daily.    . valACYclovir (VALTREX) 500 MG tablet Take 500 mg by mouth 2 (two) times daily.    [name] VITAMIN D PO Take 2,000 Units by mouth daily.    Alveda Reasons 20 MG TABS tablet TAKE 1 TABLET(20 MG) BY MOUTH DAILY WITH SUPPER 90 tablet 1   No current facility-administered medications on file prior to visit.    Past Medical History:  Diagnosis Date  . Acute non-recurrent maxillary sinusitis 01/08/2017  . Allergic rhinitis due to other allergen   . Allergy   . Angioedema    ACEI  . 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 Anxiety   . Breast cancer of upper-outer quadrant of right female breast ([place]) 10/23/2014   s/p r mastectomy, neoadj chemo completed 04/21/15; neg genetic testing  . 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  . CHEST PAIN-UNSPECIFIED 05/19/2010   Qualifier: Diagnosis of  By: Aundra Dubin, MD, [place]    . Chronic fatigue 06/08/2016  . Colon polyps 05/2014   adenomatous, colo q 5y  . Conjunctiva disorder 08/06/2018  . CONSTIPATION, CHRONIC   . Cough 04/25/2016  . Depression 09/03/2016  . Ear canal dryness 01/23/2018  . FATIGUE 06/24/2008   Qualifier: Diagnosis of  By: Wynona Luna   . GERD   . Herpes zoster without complication 10/28/4707  . Hot flashes   . HYPERLIPIDEMIA   . HYPERTENSION   . Hypokalemia 12/10/2014  . Lactose intolerance   . LRTI (lower respiratory tract infection) 09/05/2017  . LUQ cramping 04/21/2017  . Migraine headache   . MIGRAINES, HX OF 09/18/2007   Qualifier: Diagnosis of  By: Danelle Earthly CMA, Darlene    . MS (multiple sclerosis) ([place]) 11/03/2015  . Multiple sclerosis ([place])   . Muscle strain 10/21/2016  . OBESITY, TRUNCAL   . OTHER SYMPTOMS INVOLVING DIGESTIVE SYSTEM OTHER 12/14/2009   Qualifier: Diagnosis of  By: Asa Lente MD, Jannifer Rodney   . Otitis, externa, infective 05/24/2016  . PERSONAL HX COLONIC POLYPS 04/21/2009   Qualifier: Diagnosis of  By: Trellis Paganini PA-c, Amy S   . SINUSITIS 01/14/2010    Qualifier: Diagnosis of  By: Asa Lente MD, Jannifer Rodney Upper respiratory tract infection 09/23/2016  . UTI 08/19/2010   Qualifier: Diagnosis of  By: Marca Ancona RMA, Lucy    . Wheezing 09/10/2017    Past Surgical History:  Procedure Laterality Date  . BREAST BIOPSY Right    [place]   . COLONOSCOPY    . MASTECTOMY Right   . PORT-A-CATH REMOVAL Left 05/13/2015   Procedure: REMOVAL PORT-A-CATH;  Surgeon: [name], MD;  Location: [facility];  Service: General;  Laterality: Left;  . PORTACATH PLACEMENT Left 11/06/2014   Procedure: INSERTION PORT-A-CATH;  Surgeon: [name], MD;  Location: [facility];  Service: General;  Laterality: Left;  . SIMPLE  MASTECTOMY WITH AXILLARY SENTINEL NODE BIOPSY Right 05/13/2015   Procedure: RIGHT TOTAL  MASTECTOMY WITH RIGHT  AXILLARY SENTINEL NODE BIOPSY;  Surgeon: [name], MD;  Location: [facility];  Service: General;  Laterality: Right;  . TUBAL LIGATION      Social History   Socioeconomic History  . Marital status: Single    Spouse name: Not on file  . Number of children: 2  . Years of education: 74  . Highest education level: Not on file  Occupational History  . Occupation: Solicitor: [employer]  Tobacco Use  . Smoking status: Never Smoker  . Smokeless tobacco: Never Used  Vaping Use  . Vaping Use: Never used  Substance and Sexual Activity  . Alcohol use: No    Alcohol/week: 0.0 standard drinks  . Drug use: No  . Sexual activity: Not on file  Other Topics Concern  . 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    Patient is single and lives alone.    Patient is right handed.    Social Determinants of Health   Financial Resource Strain:   . Difficulty of Paying Living Expenses:   Food Insecurity:   . Worried About Charity fundraiser in the Last Year:   . Arboriculturist in the Last Year:     Transportation Needs:   . Film/video editor (Medical):   [name] Lack of Transportation (Non-Medical):   Physical Activity:   . Days of Exercise per Week:   . Minutes of Exercise per Session:   Stress:   . Feeling of Stress :   Social Connections:   . Frequency of Communication with Friends and Family:   . Frequency of Social Gatherings with Friends and Family:   . Attends Religious Services:   . Active Member of Clubs or Organizations:   . Attends Archivist Meetings:   [name] Marital Status:     Family History  Problem Relation Age of Onset  . Coronary artery disease Mother   . Heart disease Mother   . Diabetes Mother   . Hypertension Mother   . Stroke Mother   . Alcohol abuse Father   . COPD Maternal Grandmother   . Hypertension Other   . Hyperlipidemia Other   . Colon cancer Neg Hx   . Rectal cancer Neg Hx   . Stomach cancer Neg Hx   . Esophageal cancer Neg Hx     Review of Systems  Constitutional: Negative for chills and fever.  HENT: Positive for postnasal drip and sinus pressure. Negative for congestion, ear pain and sore throat.   Respiratory: Negative for cough, chest tightness, shortness of breath and wheezing.   Neurological: Positive for headaches.       Objective:   Vitals:   04/23/20 1029  BP: 134/84  Pulse: 77  Temp: 97.7 F (36.5 C)  SpO2: 97%   BP Readings from Last 3 Encounters:  04/23/20 134/84  03/25/20 129/81  02/19/20 140/78   Wt Readings from Last 3 Encounters:  04/23/20 219 lb (99.3 kg)  03/25/20 219 lb 1.6 oz (99.4 kg)  02/19/20 221 lb (100.2 kg)   Body mass index is 37.59 kg/m.   Physical Exam    GENERAL APPEARANCE: Appears stated age, well appearing, NAD EYES: conjunctiva clear, no icterus HEENT: bilateral tympanic membranes and ear canals normal, oropharynx  with no erythema, sinus tenderness on exam - maxillary and ethmoid area, no thyromegaly, trachea midline, no cervical or supraclavicular lymphadenopathy LUNGS:  Clear to auscultation without wheeze or crackles, unlabored breathing, good air entry bilaterally CARDIOVASCULAR: Normal S1,S2 without murmurs, no edema SKIN: Warm, dry      Assessment & Plan:    See Problem List for Assessment and Plan of chronic medical problems.    This visit occurred during the SARS-CoV-2 public health emergency.  Safety protocols were in place, including screening questions prior to the visit, additional usage of staff PPE, and extensive cleaning of exam room while observing appropriate contact time as indicated for disinfecting solutions.

## 2020-04-23 ENCOUNTER — Other Ambulatory Visit: Payer: Self-pay

## 2020-04-23 ENCOUNTER — Ambulatory Visit (INDEPENDENT_AMBULATORY_CARE_PROVIDER_SITE_OTHER): Payer: BC Managed Care – PPO | Admitting: Internal Medicine

## 2020-04-23 ENCOUNTER — Encounter: Payer: Self-pay | Admitting: Internal Medicine

## 2020-04-23 VITALS — BP 134/84 | HR 77 | Temp 97.7°F | Ht 64.0 in | Wt 219.0 lb

## 2020-04-23 DIAGNOSIS — J0100 Acute maxillary sinusitis, unspecified: Secondary | ICD-10-CM

## 2020-04-23 DIAGNOSIS — J329 Chronic sinusitis, unspecified: Secondary | ICD-10-CM | POA: Diagnosis not present

## 2020-04-23 MED ORDER — LINACLOTIDE 290 MCG PO CAPS: 290.0000 ug | ORAL_CAPSULE | Freq: Every day | ORAL | 0 refills | Status: DC

## 2020-04-23 MED ORDER — LINACLOTIDE 290 MCG PO CAPS: 290.0000 ug | ORAL_CAPSULE | Freq: Every day | ORAL | 3 refills | Status: DC

## 2020-04-23 MED ORDER — AZITHROMYCIN 250 MG PO TABS: ORAL_TABLET | ORAL | 0 refills | Status: DC

## 2020-04-23 MED ORDER — FLUCONAZOLE 150 MG PO TABS
150.0000 mg | ORAL_TABLET | Freq: Once | ORAL | 0 refills | Status: AC
Start: 2020-04-23 — End: 2020-04-23

## 2020-04-23 NOTE — Patient Instructions (Addendum)
°  Medications reviewed and updated.  Changes include :   Zpak, dilfucan  Your prescription(s) have been submitted to your pharmacy. Please take as directed and contact our office if you believe you are having problem(s) with the medication(s).  A referral was ordered for  ENT.      Someone from their office will call you to schedule an appointment.     Please call if there is no improvement in your symptoms.

## 2020-04-23 NOTE — Assessment & Plan Note (Addendum)
Likely bacterial  Start zpak otc cold medications, allergy meds Rest, fluid Call if no improvement   Due to frequent sinus infections will refer to ENT - also concerned about R ear itching

## 2020-05-04 ENCOUNTER — Other Ambulatory Visit: Payer: Self-pay | Admitting: Internal Medicine

## 2020-05-11 ENCOUNTER — Telehealth: Payer: Self-pay | Admitting: *Deleted

## 2020-05-11 NOTE — Telephone Encounter (Signed)
Pt calling to check status on ENT referral. Inform pt MD placed referral will forward msg to [facility] to check status.Marland KitchenJohny Chess

## 2020-05-12 NOTE — Telephone Encounter (Signed)
Pt has been contacted and scheduled with ENT

## 2020-05-19 NOTE — Progress Notes (Signed)
[place] Phone: [phone] Subjective:   [name] [name], am serving as a scribe for Dr. Hulan Saas. This visit occurred during the SARS-CoV-2 public health emergency.  Safety protocols were in place, including screening questions prior to the visit, additional usage of staff PPE, and extensive cleaning of exam room while observing appropriate contact time as indicated for disinfecting solutions.  I'm seeing this patient by the request  of:  Binnie Rail, MD  CC: Shoulder pain follow-up, foot pain follow-up  [id number]   11/21/2019 Significant improvement at this time.  Likely some residual bursitis still remaining.  Gust with patient about topical anti-inflammatories and exercises on a regular routine.  As long as patient does well can follow-up with me as needed  Update 05/21/2020 [name] [name] is a 59 y.o. female coming in with complaint of shoulder and foot pain. Patient states that her pain never went away. Pain with all movements especially at night. Using ice for pain. Pain in anterior aspect.   Having right foot pain over 5th metatarsal with weight bearing. Pain in left foot over medial longitudinal arch. Unable to walk for exercise.  Patient states that it seems to be a dull, throbbing aching pain but nothing severe that stops her with her daily activities.  Just when she tries to increase activity.  Has not done any of the other things we have suggested with different shoes or over-the-counter orthotics.      Past Medical History:  Diagnosis Date  . Acute non-recurrent maxillary sinusitis 01/08/2017  . Allergic rhinitis due to other allergen   . Allergy   . Angioedema    ACEI  . 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 Anxiety   . Breast cancer of upper-outer quadrant of right female breast ([place]) 10/23/2014   s/p r mastectomy, neoadj chemo completed 04/21/15; neg genetic testing  .  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  . CHEST PAIN-UNSPECIFIED 05/19/2010   Qualifier: Diagnosis of  By: Aundra Dubin, MD, [place]    . Chronic fatigue 06/08/2016  . Colon polyps 05/2014   adenomatous, colo q 5y  . Conjunctiva disorder 08/06/2018  . CONSTIPATION, CHRONIC   . Cough 04/25/2016  . Depression 09/03/2016  . Ear canal dryness 01/23/2018  . FATIGUE 06/24/2008   Qualifier: Diagnosis of  By: Wynona Luna   . GERD   . Herpes zoster without complication 7/89/3810  . Hot flashes   . HYPERLIPIDEMIA   . HYPERTENSION   . Hypokalemia 12/10/2014  . Lactose intolerance   . LRTI (lower respiratory tract infection) 09/05/2017  . LUQ cramping 04/21/2017  . Migraine headache   . MIGRAINES, HX OF 09/18/2007   Qualifier: Diagnosis of  By: Danelle Earthly CMA, Darlene    . MS (multiple sclerosis) ([place]) 11/03/2015  . Multiple sclerosis ([place])   . Muscle strain 10/21/2016  . OBESITY, TRUNCAL   . OTHER SYMPTOMS INVOLVING DIGESTIVE SYSTEM OTHER 12/14/2009   Qualifier: Diagnosis of  By: Asa Lente MD, Jannifer Rodney   . Otitis, externa, infective 05/24/2016  . PERSONAL HX COLONIC POLYPS 04/21/2009   Qualifier: Diagnosis of  By: Trellis Paganini PA-c, Amy S   . SINUSITIS 01/14/2010   Qualifier: Diagnosis of  By: Asa Lente MD, Jannifer Rodney Upper respiratory tract infection 09/23/2016  . UTI 08/19/2010   Qualifier: Diagnosis of  By: Marca Ancona RMA, Lucy    . Wheezing 09/10/2017   Past Surgical History:  Procedure Laterality Date  .  BREAST BIOPSY Right    [place]   . COLONOSCOPY    . MASTECTOMY Right   . PORT-A-CATH REMOVAL Left 05/13/2015   Procedure: REMOVAL PORT-A-CATH;  Surgeon: [name], MD;  Location: [facility];  Service: General;  Laterality: Left;  . PORTACATH PLACEMENT Left 11/06/2014   Procedure: INSERTION PORT-A-CATH;  Surgeon: [name], MD;  Location: [facility];  Service: General;  Laterality: Left;  . SIMPLE MASTECTOMY WITH AXILLARY SENTINEL NODE BIOPSY Right 05/13/2015   Procedure: RIGHT  TOTAL  MASTECTOMY WITH RIGHT  AXILLARY SENTINEL NODE BIOPSY;  Surgeon: [name], MD;  Location: [facility];  Service: General;  Laterality: Right;  . TUBAL LIGATION     Social History   Socioeconomic History  . Marital status: Single    Spouse name: Not on file  . Number of children: 2  . Years of education: 46  . Highest education level: Not on file  Occupational History  . 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 Smoking status: Never Smoker  . Smokeless tobacco: Never Used  Vaping Use  . Vaping Use: Never used  Substance and Sexual Activity  . Alcohol use: [name]    Alcohol/week: 0.0 standard drinks  . Drug use: [name]  . Sexual activity: Not on file  Other Topics Concern  . 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    Patient is single and lives alone.    Patient is right handed.    Social Determinants of Health   Financial Resource Strain:   . Difficulty of Paying Living Expenses: Not on file  Food Insecurity:   . Worried About Charity fundraiser in the Last Year: Not on file  . Ran Out of Food in the Last Year: Not on file  Transportation Needs:   . Lack of Transportation (Medical): Not on file  . Lack of Transportation (Non-Medical): Not on file  Physical Activity:   . Days of Exercise per Week: Not on file  . Minutes of Exercise per Session: Not on file  Stress:   . Feeling of Stress : Not on file  Social Connections:   . Frequency of Communication with Friends and Family: Not on file  . Frequency of Social Gatherings with Friends and Family: Not on file  . Attends Religious Services: Not on file  . Active Member of Clubs or Organizations: Not on file  . Attends Archivist Meetings: Not on file  . Marital Status: Not on file   Allergies  Allergen Reactions  . Ace Inhibitors Anaphylaxis     Angioedema (tongue  swelling)  . Clarithromycin Anaphylaxis    Throat swells  . Contrave [Naltrexone-Bupropion Hcl Er]     Chest and sob  . Levaquin [Levofloxacin] Other (See Comments)    Tendon pain - shoulder and calf   Family History  Problem Relation Age of Onset  . Coronary artery disease Mother   . Heart disease Mother   . Diabetes Mother   . Hypertension Mother   . Stroke Mother   . Alcohol abuse Father   . COPD Maternal Grandmother   . Hypertension Other   . Hyperlipidemia Other   . Colon cancer Neg Hx   . Rectal cancer Neg Hx   . Stomach cancer Neg Hx   . Esophageal cancer Neg Hx  Current Outpatient Medications (Cardiovascular):  .  hydrochlorothiazide (HYDRODIURIL) 25 MG tablet, Take 25 mg by mouth daily. [name]  losartan (COZAAR) 50 MG tablet, Take 1 tablet (50 mg total) by mouth daily. .  metoprolol tartrate (LOPRESSOR) 50 MG tablet, TAKE 1 TABLET(50 MG) BY MOUTH TWICE DAILY  Current Outpatient Medications (Respiratory):  .  azelastine (ASTELIN) 0.1 % nasal spray, azelastine 137 mcg (0.1 %) nasal spray aerosol .  fluticasone (FLONASE) 50 MCG/ACT nasal spray, SHAKE LIQUID AND USE 2 SPRAYS IN EACH NOSTRIL DAILY .  loratadine (CLARITIN) 10 MG tablet, Take 10 mg by mouth daily. .  SYMBICORT 160-4.5 MCG/ACT inhaler, INHALE 2 PUFFS INTO THE LUNGS TWICE DAILY (Patient taking differently: Takes as needed)   Current Outpatient Medications (Hematological):  .  ferrous gluconate (FERGON) 324 MG tablet, Take 1 tablet (324 mg total) by mouth 2 (two) times daily with a meal. (Patient taking differently: Take 324 mg by mouth 3 (three) times a week. ) .  XARELTO 20 MG TABS tablet, TAKE 1 TABLET(20 MG) BY MOUTH DAILY WITH SUPPER  Current Outpatient Medications (Other):  .  azithromycin (ZITHROMAX) 250 MG tablet, Take two tabs the first day and then one tab daily for four days .  b complex vitamins tablet, Take 1 tablet by mouth daily. [name]  COPAXONE 40 MG/ML SOSY, INJECT ONE SYRINGE SUBCUTANEOUSLY  THREE TIMES PER WEEK AT LEAST 48 HOURS APART. ALLOW SYRINGE TO WARM TO ROOM TEMPERATURE FOR 20 MINUTES. REFRIGERATE. .  Diclofenac Sodium 2 % SOLN, Place 2 g onto the skin 2 (two) times daily. .  ergocalciferol (VITAMIN D2) 1.25 MG (50000 UT) capsule, ergocalciferol (vitamin D2) 1,250 mcg (50,000 unit) capsule .  gabapentin (NEURONTIN) 100 MG capsule, Take 3 capsules (300 mg total) by mouth 3 (three) times daily. [name]  linaclotide (LINZESS) 290 MCG CAPS capsule, Take 1 capsule (290 mcg total) by mouth daily before breakfast. .  pantoprazole (PROTONIX) 40 MG tablet, TAKE 1 TABLET BY MOUTH EVERY DAY .  potassium chloride (KLOR-CON) 10 MEQ tablet, TAKE 4 TABLETS(40 MEQ) BY MOUTH DAILY .  Probiotic Product (PROBIOTIC PO), Take 2 capsules by mouth daily. .  TURMERIC PO, Take by mouth daily. .  valACYclovir (VALTREX) 500 MG tablet, Take 500 mg by mouth 2 (two) times daily. [name]  VITAMIN D PO, Take 2,000 Units by mouth daily.   Reviewed prior external information including notes and imaging from  primary care provider As well as notes that were available from care everywhere and other healthcare systems.  Past medical history, social, surgical and family history all reviewed in electronic medical record.  [name] pertanent information unless stated regarding to the chief complaint.   Review of Systems:  [name] headache, visual changes, nausea, vomiting, diarrhea, constipation, dizziness, abdominal pain, skin rash, fevers, chills, night sweats, weight loss, swollen lymph nodes, body aches, joint swelling, chest pain, shortness of breath, mood changes. POSITIVE muscle aches  Objective  Blood pressure 124/84, pulse 71, height 5\' 4"  (1.626 m), weight 220 lb (99.8 kg), last menstrual period 11/01/2012, SpO2 98 %.   General: [name] apparent distress alert and oriented x3 mood and affect normal, dressed appropriately.  HEENT: Pupils equal, extraocular movements intact  Respiratory: Patient's speak in full sentences and  does not appear short of breath  Cardiovascular: [name] lower extremity edema, non tender, [name] erythema  Neuro: Cranial nerves II through XII are intact, neurovascularly intact in all extremities with 2+ DTRs and 2+ pulses.  Gait normal with good balance and coordination.  Foot exam shows the patient does have some mild breakdown of the transverse arch noted.  Minimally tender over the proximal fifth metatarsal.  Patient has rigid midfoot noted. MSK:   Shoulder: Right Inspection reveals [name] abnormalities, atrophy or asymmetry. Palpation is normal with [name] tenderness over AC joint or bicipital groove. ROM is full in all planes passively. Rotator cuff strength normal throughout. signs of impingement with positive Neer and Hawkin's tests, but negative empty can sign. Speeds and Yergason's tests normal. [name] labral pathology noted with negative Obrien's, negative clunk and good stability. Normal scapular function observed. [name] painful arc and [name] drop arm sign. [name] apprehension sign  Contralateral shoulder unremarkable  Procedure: Real-time Ultrasound Guided Injection of right glenohumeral joint Device: GE Logiq E  Ultrasound guided injection is preferred based studies that show increased duration, increased effect, greater accuracy, decreased procedural pain, increased response rate with ultrasound guided versus blind injection.  Verbal informed consent obtained.  Time-out conducted.  Noted [name] overlying erythema, induration, or other signs of local infection.  Skin prepped in a sterile fashion.  Local anesthesia: Topical Ethyl chloride.  With sterile technique and under real time ultrasound guidance:  Joint visualized.  23g 1  inch needle inserted posterior approach. Pictures taken for needle placement. Patient did have injection of 2 cc of 1% lidocaine, 2 cc of 0.5% Marcaine, and 1.0 cc of Kenalog 40 mg/dL. Completed without difficulty  Pain immediately resolved suggesting accurate placement of the  medication.  Advised to call if fevers/chills, erythema, induration, drainage, or persistent bleeding.  Images permanently stored and available for review in the ultrasound unit.  Impression: Technically successful ultrasound guided injection.    Impression and Recommendations:     The above documentation has been reviewed and is accurate and complete Lyndal Pulley, DO       Note: This dictation was prepared with Dragon dictation along with smaller phrase technology. Any transcriptional errors that result from this process are unintentional.

## 2020-05-21 ENCOUNTER — Encounter: Payer: Self-pay | Admitting: Family Medicine

## 2020-05-21 ENCOUNTER — Ambulatory Visit: Payer: Self-pay

## 2020-05-21 ENCOUNTER — Ambulatory Visit (INDEPENDENT_AMBULATORY_CARE_PROVIDER_SITE_OTHER): Payer: BC Managed Care – PPO | Admitting: Family Medicine

## 2020-05-21 ENCOUNTER — Other Ambulatory Visit: Payer: Self-pay

## 2020-05-21 VITALS — BP 124/84 | HR 71 | Ht 64.0 in | Wt 220.0 lb

## 2020-05-21 DIAGNOSIS — G8929 Other chronic pain: Secondary | ICD-10-CM

## 2020-05-21 DIAGNOSIS — M25511 Pain in right shoulder: Secondary | ICD-10-CM

## 2020-05-21 DIAGNOSIS — M2141 Flat foot [pes planus] (acquired), right foot: Secondary | ICD-10-CM | POA: Diagnosis not present

## 2020-05-21 DIAGNOSIS — M2142 Flat foot [pes planus] (acquired), left foot: Secondary | ICD-10-CM | POA: Diagnosis not present

## 2020-05-21 NOTE — Assessment & Plan Note (Signed)
Significant pes planus, rigid midfoot, discussed over-the-counter orthotics.  Patient has been noncompliant over the course of time.  Discussed home home sandals that could be helpful as well.  Follow-up in 2 to 3 months

## 2020-05-21 NOTE — Assessment & Plan Note (Signed)
Attempted injection more than the glenohumeral joint this time.  If continuing to have pain will consider the acromioclavicular joint at follow-up.  Patient is is a chronic problem.  Patient does have a history of breast cancer but x-rays have been only showing a glenohumeral and acromioclavicular arthritis noted with no bony mets.  We discussed advanced imaging which patient declined.  Patient will follow up again in 2 to 3 months

## 2020-05-21 NOTE — Patient Instructions (Addendum)
Good to see you [employer] orthotics "total support" [place] recovery sandals Rigid toe shoes  See me again in 3 months

## 2020-05-25 ENCOUNTER — Encounter: Payer: Self-pay | Admitting: Family Medicine

## 2020-05-26 ENCOUNTER — Ambulatory Visit (INDEPENDENT_AMBULATORY_CARE_PROVIDER_SITE_OTHER): Payer: BC Managed Care – PPO | Admitting: Otolaryngology

## 2020-05-27 DIAGNOSIS — R0989 Other specified symptoms and signs involving the circulatory and respiratory systems: Secondary | ICD-10-CM

## 2020-05-27 DIAGNOSIS — J309 Allergic rhinitis, unspecified: Secondary | ICD-10-CM

## 2020-06-02 ENCOUNTER — Telehealth: Payer: Self-pay | Admitting: Neurology

## 2020-06-02 NOTE — Telephone Encounter (Signed)
Pt has not been seen here in follow up visit since 11/2018. We can not complete the PA until the patient has had a follow up visit in office.   **if pharmacy returns call please advise that we have reached out to patient and she has yet to schedule a follow up visit. The PA can't be completed at this time.

## 2020-06-02 NOTE — Telephone Encounter (Signed)
[facility] request PA for COPAXONE 40 MG/ML SOSY

## 2020-06-11 ENCOUNTER — Ambulatory Visit (INDEPENDENT_AMBULATORY_CARE_PROVIDER_SITE_OTHER): Payer: BC Managed Care – PPO | Admitting: Otolaryngology

## 2020-06-11 ENCOUNTER — Other Ambulatory Visit: Payer: Self-pay

## 2020-06-11 VITALS — Temp 97.3°F

## 2020-06-11 DIAGNOSIS — G4733 Obstructive sleep apnea (adult) (pediatric): Secondary | ICD-10-CM | POA: Diagnosis not present

## 2020-06-11 DIAGNOSIS — J310 Chronic rhinitis: Secondary | ICD-10-CM

## 2020-06-11 NOTE — Progress Notes (Signed)
HPI: [name] [name] is a 59 y.o. female who presents is referred by Dr. Quay Burow for evaluation of complaints of nasal congestion as well as history of recurrent sinus infections. She states that when she wears her CPAP machine more than 2 nights in a row she gets nasal congestion. She also has a previous history of recurrent sinus infections 4-5 infections a year where she is treated with antibiotics. Her last sinus infection was about a month ago. She is doing well presently. She was diagnosed with obstructive sleep apnea about a year ago and has been on CPAP machine since that time. She has Flonase and azelastine nasal sprays that she uses on a as needed basis.  Past Medical History:  Diagnosis Date  . Acute non-recurrent maxillary sinusitis 01/08/2017  . Allergic rhinitis due to other allergen   . Allergy   . Angioedema    ACEI  . 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 Anxiety   . Breast cancer of upper-outer quadrant of right female breast ([place]) 10/23/2014   s/p r mastectomy, neoadj chemo completed 04/21/15; neg genetic testing  . 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  . CHEST PAIN-UNSPECIFIED 05/19/2010   Qualifier: Diagnosis of  By: Aundra Dubin, MD, [place]    . Chronic fatigue 06/08/2016  . Colon polyps 05/2014   adenomatous, colo q 5y  . Conjunctiva disorder 08/06/2018  . CONSTIPATION, CHRONIC   . Cough 04/25/2016  . Depression 09/03/2016  . Ear canal dryness 01/23/2018  . FATIGUE 06/24/2008   Qualifier: Diagnosis of  By: Wynona Luna   . GERD   . Herpes zoster without complication 03/08/3085  . Hot flashes   . HYPERLIPIDEMIA   . HYPERTENSION   . Hypokalemia 12/10/2014  . Lactose intolerance   . LRTI (lower respiratory tract infection) 09/05/2017  . LUQ cramping 04/21/2017  . Migraine headache   . MIGRAINES, HX OF 09/18/2007   Qualifier: Diagnosis of  By: Danelle Earthly CMA, Darlene    . MS (multiple sclerosis) ([place]) 11/03/2015  . Multiple sclerosis ([place])   . Muscle  strain 10/21/2016  . OBESITY, TRUNCAL   . OTHER SYMPTOMS INVOLVING DIGESTIVE SYSTEM OTHER 12/14/2009   Qualifier: Diagnosis of  By: Asa Lente MD, Jannifer Rodney   . Otitis, externa, infective 05/24/2016  . PERSONAL HX COLONIC POLYPS 04/21/2009   Qualifier: Diagnosis of  By: Trellis Paganini PA-c, Amy S   . SINUSITIS 01/14/2010   Qualifier: Diagnosis of  By: Asa Lente MD, Jannifer Rodney Upper respiratory tract infection 09/23/2016  . UTI 08/19/2010   Qualifier: Diagnosis of  By: Marca Ancona RMA, Lucy    . Wheezing 09/10/2017   Past Surgical History:  Procedure Laterality Date  . BREAST BIOPSY Right    [place]   . COLONOSCOPY    . MASTECTOMY Right   . PORT-A-CATH REMOVAL Left 05/13/2015   Procedure: REMOVAL PORT-A-CATH;  Surgeon: [name], MD;  Location: [place];  Service: General;  Laterality: Left;  . PORTACATH PLACEMENT Left 11/06/2014   Procedure: INSERTION PORT-A-CATH;  Surgeon: [name], MD;  Location: [place];  Service: General;  Laterality: Left;  . SIMPLE MASTECTOMY WITH AXILLARY SENTINEL NODE BIOPSY Right 05/13/2015   Procedure: RIGHT TOTAL  MASTECTOMY WITH RIGHT  AXILLARY SENTINEL NODE BIOPSY;  Surgeon: [name], MD;  Location: [place];  Service: General;  Laterality: Right;  . TUBAL LIGATION     Social History   Socioeconomic History  . Marital status: Single    Spouse name: Not on file  .  Number of children: 2  . Years of education: 83  . Highest education level: Not on file  Occupational History  . Occupation: Solicitor: [employer]  Tobacco Use  . Smoking status: Never Smoker  . Smokeless tobacco: Never Used  Vaping Use  . Vaping Use: Never used  Substance and Sexual Activity  . Alcohol use: No    Alcohol/week: 0.0 standard drinks  . Drug use: No  . Sexual activity: Not on file  Other Topics Concern  . 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    Patient is single and lives alone.    Patient is right handed.    Social Determinants of Health   Financial Resource Strain:   . Difficulty of Paying Living Expenses: Not on file  Food Insecurity:   . Worried About Charity fundraiser in the Last Year: Not on file  . Ran Out of Food in the Last Year: Not on file  Transportation Needs:   . Lack of Transportation (Medical): Not on file  . Lack of Transportation (Non-Medical): Not on file  Physical Activity:   . Days of Exercise per Week: Not on file  . Minutes of Exercise per Session: Not on file  Stress:   . Feeling of Stress : Not on file  Social Connections:   . Frequency of Communication with Friends and Family: Not on file  . Frequency of Social Gatherings with Friends and Family: Not on file  . Attends Religious Services: Not on file  . Active Member of Clubs or Organizations: Not on file  . Attends Archivist Meetings: Not on file  . Marital Status: Not on file   Family History  Problem Relation Age of Onset  . Coronary artery disease Mother   . Heart disease Mother   . Diabetes Mother   . Hypertension Mother   . Stroke Mother   . Alcohol abuse Father   . COPD Maternal Grandmother   . Hypertension Other   . Hyperlipidemia Other   . Colon cancer Neg Hx   . Rectal cancer Neg Hx   . Stomach cancer Neg Hx   . Esophageal cancer Neg Hx    Allergies  Allergen Reactions  . Ace Inhibitors Anaphylaxis     Angioedema (tongue swelling)  . Clarithromycin Anaphylaxis    Throat swells  . Contrave [Naltrexone-Bupropion Hcl Er]     Chest and sob  . Levaquin [Levofloxacin] Other (See Comments)    Tendon pain - shoulder and calf   Prior to Admission medications   Medication Sig Start Date End Date Taking? Authorizing Provider  azelastine (ASTELIN) 0.1 % nasal spray azelastine 137 mcg (0.1 %) nasal spray aerosol 12/08/19  Yes Burns, Claudina Lick, MD  b complex vitamins tablet Take 1 tablet by  mouth daily.   Yes [provider]  COPAXONE 40 MG/ML SOSY INJECT ONE SYRINGE SUBCUTANEOUSLY THREE TIMES PER WEEK AT LEAST 49 HOURS APART. ALLOW SYRINGE TO WARM TO ROOM TEMPERATURE FOR 20 MINUTES. REFRIGERATE. 12/02/19  Yes Dohmeier, Asencion Partridge, MD  Diclofenac Sodium 2 % SOLN Place 2 g onto the skin 2 (two) times daily. 10/10/19  Yes Lyndal Pulley, DO  ergocalciferol (VITAMIN D2) 1.25 MG (50000 UT) capsule ergocalciferol (vitamin D2) 1,250 mcg (50,000 unit) capsule   Yes [provider]  ferrous gluconate (FERGON) 324 MG tablet  Take 1 tablet (324 mg total) by mouth 2 (two) times daily with a meal. Patient taking differently: Take 324 mg by mouth 3 (three) times a week.  06/15/15  Yes Magrinat, Virgie Dad, MD  fluticasone (FLONASE) 50 MCG/ACT nasal spray SHAKE LIQUID AND USE 2 SPRAYS IN EACH NOSTRIL DAILY 01/05/20  Yes Burns, Claudina Lick, MD  gabapentin (NEURONTIN) 100 MG capsule Take 3 capsules (300 mg total) by mouth 3 (three) times daily. 01/19/20  Yes Burns, Claudina Lick, MD  hydrochlorothiazide (HYDRODIURIL) 25 MG tablet Take 25 mg by mouth daily. 03/22/20  Yes [provider]  linaclotide Rolan Lipa) 290 MCG CAPS capsule Take 1 capsule (290 mcg total) by mouth daily before breakfast. 04/23/20  Yes Burns, Claudina Lick, MD  loratadine (CLARITIN) 10 MG tablet Take 10 mg by mouth daily.   Yes [provider]  losartan (COZAAR) 50 MG tablet Take 1 tablet (50 mg total) by mouth daily. 01/28/20  Yes Burns, Claudina Lick, MD  metoprolol tartrate (LOPRESSOR) 50 MG tablet TAKE 1 TABLET(50 MG) BY MOUTH TWICE DAILY 11/25/19  Yes Burns, Claudina Lick, MD  pantoprazole (PROTONIX) 40 MG tablet TAKE 1 TABLET BY MOUTH EVERY DAY 02/02/20  Yes Burns, Claudina Lick, MD  potassium chloride (KLOR-CON) 10 MEQ tablet TAKE 4 TABLETS(40 MEQ) BY MOUTH DAILY 05/04/20  Yes Burns, Claudina Lick, MD  Probiotic Product (PROBIOTIC PO) Take 2 capsules by mouth daily.   Yes [provider]  SYMBICORT 160-4.5 MCG/ACT inhaler INHALE 2 PUFFS  INTO THE LUNGS TWICE DAILY Patient taking differently: Takes as needed 01/15/19  Yes Burns, Claudina Lick, MD  TURMERIC PO Take by mouth daily.   Yes [provider]  valACYclovir (VALTREX) 500 MG tablet Take 500 mg by mouth 2 (two) times daily. 12/05/19  Yes [provider]  VITAMIN D PO Take 2,000 Units by mouth daily.   Yes [provider]  XARELTO 20 MG TABS tablet TAKE 1 TABLET(20 MG) BY MOUTH DAILY WITH SUPPER 03/15/20  Yes Burns, Claudina Lick, MD  azithromycin (ZITHROMAX) 250 MG tablet Take two tabs the first day and then one tab daily for four days Patient not taking: Reported on 06/11/2020 04/23/20   Binnie Rail, MD     Positive ROS: Otherwise negative  All other systems have been reviewed and were otherwise negative with the exception of those mentioned in the HPI and as above.  Physical Exam: Constitutional: Alert, well-appearing, no acute distress Ears: External ears without lesions or tenderness. Ear canals are clear bilaterally. TMs are clear bilaterally with good mobility pneumatic otoscopy. On hearing screening with a tuning fork she has good hearing in both ears with AC > BC bilaterally. Nasal: External nose without lesions. Septum relatively midline with mild rhinitis.. Both middle meatus regions are clear with no purulent discharge. Minimal mucus within the nasal cavity is clear. She is breathing comfortably presently. Oral: Lips and gums without lesions. Tongue and palate mucosa without lesions. Posterior oropharynx clear. Tonsils are small bilaterally and symmetric. Neck: No palpable adenopathy or masses Respiratory: Breathing comfortably  Skin: No facial/neck lesions or rash noted.  Procedures  Assessment: Clear nasal passages on exam in the office today with no signs of infection. She does have mild rhinitis.  Plan: Would recommend regular use of the Flonase 2 sprays each nostril at night as this will provide better relief of her nasal congestion if  she uses on a regular basis. And use of Astelin on a as needed basis. She can also  use saline nasal irrigation on a as needed basis during the day for congestion. I reviewed an MRI scan that was performed 12 years ago and on review of this scan she had clear paranasal sinuses. If she continues to have recurrent sinus infections consider CT scan of her sinuses. She will follow-up as needed.   Radene Journey, MD   CC:

## 2020-07-02 ENCOUNTER — Other Ambulatory Visit: Payer: Self-pay

## 2020-07-02 ENCOUNTER — Ambulatory Visit (INDEPENDENT_AMBULATORY_CARE_PROVIDER_SITE_OTHER): Payer: BC Managed Care – PPO | Admitting: *Deleted

## 2020-07-02 DIAGNOSIS — Z23 Encounter for immunization: Secondary | ICD-10-CM

## 2020-07-20 ENCOUNTER — Encounter: Payer: Self-pay | Admitting: Internal Medicine

## 2020-07-23 NOTE — Telephone Encounter (Signed)
Attempted phone call to pt.  Unable to leave voicemail message as mailbox is full.

## 2020-07-28 ENCOUNTER — Encounter: Payer: Self-pay | Admitting: Internal Medicine

## 2020-07-28 ENCOUNTER — Other Ambulatory Visit: Payer: Self-pay

## 2020-07-28 ENCOUNTER — Telehealth: Payer: Self-pay | Admitting: Cardiology

## 2020-07-28 ENCOUNTER — Ambulatory Visit (INDEPENDENT_AMBULATORY_CARE_PROVIDER_SITE_OTHER): Payer: BC Managed Care – PPO | Admitting: Internal Medicine

## 2020-07-28 VITALS — BP 140/82 | HR 70 | Temp 98.4°F | Ht 64.0 in | Wt 218.0 lb

## 2020-07-28 DIAGNOSIS — R8290 Unspecified abnormal findings in urine: Secondary | ICD-10-CM | POA: Diagnosis not present

## 2020-07-28 DIAGNOSIS — M533 Sacrococcygeal disorders, not elsewhere classified: Secondary | ICD-10-CM

## 2020-07-28 DIAGNOSIS — G4733 Obstructive sleep apnea (adult) (pediatric): Secondary | ICD-10-CM

## 2020-07-28 LAB — POC URINALSYSI DIPSTICK (AUTOMATED)
Bilirubin, UA: NEGATIVE
Blood, UA: NEGATIVE
Glucose, UA: NEGATIVE
Ketones, UA: NEGATIVE
Leukocytes, UA: NEGATIVE
Nitrite, UA: NEGATIVE
Protein, UA: NEGATIVE
Spec Grav, UA: >=1.03 — AB (ref 1.010–1.025)
Urobilinogen, UA: 0.2 E.U./dL
pH, UA: 6 (ref 5.0–8.0)

## 2020-07-28 NOTE — Telephone Encounter (Signed)
Patient states that she sent a MyChart message to Dr. Radford Pax and her nurse on 11/05 and still has not heard anything back. Please advise.

## 2020-07-28 NOTE — Telephone Encounter (Signed)
Please find out if she has water in her tubing and mask when she wakes up

## 2020-07-28 NOTE — Assessment & Plan Note (Signed)
Acute Having pain in L SI joint region - no pain today and non-tender on exam Not related to urine symptoms If pain persists will f/u with Dr Tamala Julian

## 2020-07-28 NOTE — Patient Instructions (Addendum)
Your left lower back pain is likely your SI joint.    Your urine is normal - no infection.      A referral was ordered for Dr Leafy Ro.

## 2020-07-28 NOTE — Telephone Encounter (Signed)
Spoke with the patient who states that when she uses her CPAP two nights in a row, she wakes up the next morning with a head congestion, runny nose, and sneezing. She states when she doesn't use it the following couple nights her symptoms go away.  She saw ENT who advised her to increase her use of nasal spray. She states that she does not want to have to use nasal spray daily. I asked patient if she had attempted to use it daily to see if it helped and she states that she has and it did not work. Patient would like to know what she can do because she wants to be compliant but is unable to with the symptoms that it is causing.

## 2020-07-28 NOTE — Progress Notes (Signed)
Subjective:    Patient ID: [name] [name], female    DOB: [birth year], 59 y.o.   MRN: [id number]  HPI The patient is here for an acute visit.   Left lower back pain - started one week ago and it is intermittent.  Nothing makes it better/worse  Urine smells . She drinks a lot of water but her urine smells.  She was concerned she may have a UTI.    Medications and allergies reviewed with patient and updated if appropriate.  Patient Active Problem List   Diagnosis Date Noted  . Urine frequency 12/22/2019  . Right-sided chest wall pain 12/22/2019  . AC (acromioclavicular) arthritis 10/09/2019  . Acute pain of right shoulder 09/04/2019  . Acute sinus infection 08/19/2019  . Atrial flutter ([place]) 02/28/2019  . RLS (restless legs syndrome) 11/21/2018  . Sleep difficulties 08/06/2018  . Trigger finger 01/23/2018  . Meralgia paraesthetica, right 11/05/2017  . Iron deficiency 10/09/2017  . Vitamin D deficiency 06/15/2017  . Lumbar radiculopathy 04/21/2017  . Trigger index finger of left hand 04/21/2017  . Morbid obesity ([place]) 04/21/2017  . Left hip pain 10/31/2016  . Pes planus 10/31/2016  . Posterior tibialis muscle dysfunction 10/31/2016  . Breast pain, right 08/24/2016  . Palpitation 04/04/2016  . 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 Malignant neoplasm of upper-outer quadrant of right breast in female, 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[place]) 11/03/2015  . S/P mastectomy 05/13/2015  . Chemotherapy-induced neuropathy ([place]) 04/21/2015  . Genetic testing 12/11/2014  . Breast cancer of upper-outer quadrant of right female breast ([place]) 10/23/2014  . Allergic rhinitis 11/29/2009  . 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 CONSTIPATION, CHRONIC 08/17/2008  . METABOLIC SYNDROME X 42/35/3614  . GERD 01/07/2008  . Essential hypertension 09/20/2007  . Multiple sclerosis ([place]) 09/18/2007    Current Outpatient Medications on File Prior to Visit  Medication Sig Dispense Refill  . azelastine (ASTELIN) 0.1 % nasal  spray azelastine 137 mcg (0.1 %) nasal spray aerosol 30 mL 3  . b complex vitamins tablet Take 1 tablet by mouth daily.    [name] COPAXONE 40 MG/ML SOSY INJECT ONE SYRINGE SUBCUTANEOUSLY THREE TIMES PER WEEK AT LEAST 48 HOURS APART. ALLOW SYRINGE TO WARM TO ROOM TEMPERATURE FOR 20 MINUTES. REFRIGERATE. 36 mL 2  . ergocalciferol (VITAMIN D2) 1.25 MG (50000 UT) capsule ergocalciferol (vitamin D2) 1,250 mcg (50,000 unit) capsule    . ferrous gluconate (FERGON) 324 MG tablet Take 1 tablet (324 mg total) by mouth 2 (two) times daily with a meal. (Patient taking differently: Take 324 mg by mouth 3 (three) times a week. ) 180 tablet 3  . fluticasone (FLONASE) 50 MCG/ACT nasal spray SHAKE LIQUID AND USE 2 SPRAYS IN EACH NOSTRIL DAILY 16 g 6  . gabapentin (NEURONTIN) 100 MG capsule Take 3 capsules (300 mg total) by mouth 3 (three) times daily. 810 capsule 1  . hydrochlorothiazide (HYDRODIURIL) 25 MG tablet Take 25 mg by mouth daily.    [name] linaclotide (LINZESS) 290 MCG CAPS capsule Take 1 capsule (290 mcg total) by mouth daily before breakfast. 42 capsule 0  . loratadine (CLARITIN) 10 MG tablet Take 10 mg by mouth daily.    [name] losartan (COZAAR) 50 MG tablet Take 1 tablet (50 mg total) by mouth daily. 90 tablet 1  . metoprolol tartrate (LOPRESSOR) 50 MG tablet TAKE 1 TABLET(50 MG) BY MOUTH TWICE DAILY 180 tablet 1  . pantoprazole (PROTONIX) 40 MG tablet TAKE 1 TABLET BY MOUTH EVERY DAY 90 tablet 1  . potassium  chloride (KLOR-CON) 10 MEQ tablet TAKE 4 TABLETS(40 MEQ) BY MOUTH DAILY 120 tablet 5  . Probiotic Product (PROBIOTIC PO) Take 2 capsules by mouth daily.    . SYMBICORT 160-4.5 MCG/ACT inhaler INHALE 2 PUFFS INTO THE LUNGS TWICE DAILY (Patient taking differently: Takes as needed) 10.2 g 0  . TURMERIC PO Take by mouth daily.    . valACYclovir (VALTREX) 500 MG tablet Take 500 mg by mouth 2 (two) times daily.    [name] VITAMIN D PO Take 2,000 Units by mouth daily.    Alveda Reasons 20 MG TABS tablet TAKE 1 TABLET(20 MG) BY  MOUTH DAILY WITH SUPPER 90 tablet 1   No current facility-administered medications on file prior to visit.    Past Medical History:  Diagnosis Date  . Acute non-recurrent maxillary sinusitis 01/08/2017  . Allergic rhinitis due to other allergen   . Allergy   . Angioedema    ACEI  . 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 Anxiety   . Breast cancer of upper-outer quadrant of right female breast ([place]) 10/23/2014   s/p r mastectomy, neoadj chemo completed 04/21/15; neg genetic testing  . 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  . CHEST PAIN-UNSPECIFIED 05/19/2010   Qualifier: Diagnosis of  By: Aundra Dubin, MD, [place]    . Chronic fatigue 06/08/2016  . Colon polyps 05/2014   adenomatous, colo q 5y  . Conjunctiva disorder 08/06/2018  . CONSTIPATION, CHRONIC   . Cough 04/25/2016  . Depression 09/03/2016  . Ear canal dryness 01/23/2018  . FATIGUE 06/24/2008   Qualifier: Diagnosis of  By: Wynona Luna   . GERD   . Herpes zoster without complication 7/37/1062  . Hot flashes   . HYPERLIPIDEMIA   . HYPERTENSION   . Hypokalemia 12/10/2014  . Lactose intolerance   . LRTI (lower respiratory tract infection) 09/05/2017  . LUQ cramping 04/21/2017  . Migraine headache   . MIGRAINES, HX OF 09/18/2007   Qualifier: Diagnosis of  By: Danelle Earthly CMA, Darlene    . MS (multiple sclerosis) ([place]) 11/03/2015  . Multiple sclerosis ([place])   . Muscle strain 10/21/2016  . OBESITY, TRUNCAL   . OTHER SYMPTOMS INVOLVING DIGESTIVE SYSTEM OTHER 12/14/2009   Qualifier: Diagnosis of  By: Asa Lente MD, Jannifer Rodney   . Otitis, externa, infective 05/24/2016  . PERSONAL HX COLONIC POLYPS 04/21/2009   Qualifier: Diagnosis of  By: Trellis Paganini PA-c, Amy S   . SINUSITIS 01/14/2010   Qualifier: Diagnosis of  By: Asa Lente MD, Jannifer Rodney Upper respiratory tract infection 09/23/2016  . UTI 08/19/2010   Qualifier: Diagnosis of  By: Marca Ancona RMA, Lucy    . Wheezing 09/10/2017    Past Surgical History:  Procedure Laterality  Date  . BREAST BIOPSY Right    [place]   . COLONOSCOPY    . MASTECTOMY Right   . PORT-A-CATH REMOVAL Left 05/13/2015   Procedure: REMOVAL PORT-A-CATH;  Surgeon: [name], MD;  Location: [facility];  Service: General;  Laterality: Left;  . PORTACATH PLACEMENT Left 11/06/2014   Procedure: INSERTION PORT-A-CATH;  Surgeon: [name], MD;  Location: [facility];  Service: General;  Laterality: Left;  . SIMPLE MASTECTOMY WITH AXILLARY SENTINEL NODE BIOPSY Right 05/13/2015   Procedure: RIGHT TOTAL  MASTECTOMY WITH RIGHT  AXILLARY SENTINEL NODE BIOPSY;  Surgeon: [name], MD;  Location: [facility];  Service: General;  Laterality: Right;  . TUBAL LIGATION      Social History   Socioeconomic History  . Marital status:  Single    Spouse name: Not on file  . Number of children: 2  . Years of education: 6  . Highest education level: Not on file  Occupational History  . 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 Smoking status: Never Smoker  . Smokeless tobacco: Never Used  Vaping Use  . Vaping Use: Never used  Substance and Sexual Activity  . Alcohol use: No    Alcohol/week: 0.0 standard drinks  . Drug use: No  . Sexual activity: Not on file  Other Topics Concern  . 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    Patient is single and lives alone.    Patient is right handed.    Social Determinants of Health   Financial Resource Strain:   . Difficulty of Paying Living Expenses: Not on file  Food Insecurity:   . Worried About Charity fundraiser in the Last Year: Not on file  . Ran Out of Food in the Last Year: Not on file  Transportation Needs:   . Lack of Transportation (Medical): Not on file  . Lack of Transportation (Non-Medical): Not on file  Physical Activity:   . Days of Exercise per Week: Not on file  . Minutes of  Exercise per Session: Not on file  Stress:   . Feeling of Stress : Not on file  Social Connections:   . Frequency of Communication with Friends and Family: Not on file  . Frequency of Social Gatherings with Friends and Family: Not on file  . Attends Religious Services: Not on file  . Active Member of Clubs or Organizations: Not on file  . Attends Archivist Meetings: Not on file  . Marital Status: Not on file    Family History  Problem Relation Age of Onset  . Coronary artery disease Mother   . Heart disease Mother   . Diabetes Mother   . Hypertension Mother   . Stroke Mother   . Alcohol abuse Father   . COPD Maternal Grandmother   . Hypertension Other   . Hyperlipidemia Other   . Colon cancer Neg Hx   . Rectal cancer Neg Hx   . Stomach cancer Neg Hx   . Esophageal cancer Neg Hx     Review of Systems  Constitutional: Negative for fever.  Gastrointestinal: Negative for abdominal pain and nausea.  Genitourinary: Positive for frequency (a little but drinks a lot). Negative for dysuria, flank pain and hematuria.       Objective:   Vitals:   07/28/20 1528  BP: 140/82  Pulse: 70  Temp: 98.4 F (36.9 C)  SpO2: 99%   BP Readings from Last 3 Encounters:  07/28/20 140/82  05/21/20 124/84  04/23/20 134/84   Wt Readings from Last 3 Encounters:  07/28/20 218 lb (98.9 kg)  05/21/20 220 lb (99.8 kg)  04/23/20 219 lb (99.3 kg)   Body mass index is 37.42 kg/m.   Physical Exam Constitutional:      General: She is not in acute distress.    Appearance: Normal appearance. She is not ill-appearing.  HENT:     Head: Normocephalic and atraumatic.  Abdominal:     Palpations: Abdomen is soft.     Tenderness: There is no abdominal tenderness. There is no right CVA tenderness or left CVA tenderness.  Musculoskeletal:  General: No tenderness (no tenderness with palpation,, but area of tenderness is L SI joint).  Skin:    General: Skin is warm and dry.    Neurological:     Mental Status: She is alert.            Assessment & Plan:    See Problem List for Assessment and Plan of chronic medical problems.    This visit occurred during the SARS-CoV-2 public health emergency.  Safety protocols were in place, including screening questions prior to the visit, additional usage of staff PPE, and extensive cleaning of exam room while observing appropriate contact time as indicated for disinfecting solutions.

## 2020-07-28 NOTE — Assessment & Plan Note (Signed)
Acute Drinks a good amount of urine No other urine symptoms Urine dip here normal - will not culture given lack of other symptoms Continue increased fluids

## 2020-07-28 NOTE — Assessment & Plan Note (Signed)
Chronic She is walking 1.5-2 miles every other day She is eating better and eating less Still not losing weight - she is very frustrated Would like to see Dr Leafy Ro again - referral ordered

## 2020-07-29 NOTE — Telephone Encounter (Signed)
No, she states she does not water in the tubing or her mask. She states after wearing her cpap two nights in row she has this problem. This happened after the pressure was changed to a set pressure.

## 2020-07-29 NOTE — Telephone Encounter (Signed)
Change CPAP to auto from 4 to 10cm H2O and get a download in 2 weeks. Have patient call back to see if changing to auto improves her symptoms

## 2020-07-30 NOTE — Telephone Encounter (Signed)
Order faxed to [facility] and  patient notified to call back to report if symptoms improved.

## 2020-08-01 ENCOUNTER — Encounter (INDEPENDENT_AMBULATORY_CARE_PROVIDER_SITE_OTHER): Payer: Self-pay

## 2020-08-03 NOTE — Telephone Encounter (Signed)
Patient called to say she loves the auto set she rest well at this setting but after two nights of wearing her machine its seem like she has allergies. Her nose runs and she is sneezing. She is cleaning her machine weekly she ask what can she do. Please advise

## 2020-08-03 NOTE — Telephone Encounter (Signed)
She needs to followu the ENT recommendations on nasal spray he presribed

## 2020-08-04 ENCOUNTER — Other Ambulatory Visit: Payer: Self-pay | Admitting: Internal Medicine

## 2020-08-14 ENCOUNTER — Encounter: Payer: Self-pay | Admitting: Internal Medicine

## 2020-08-15 MED ORDER — SAXENDA 18 MG/3ML ~~LOC~~ SOPN: PEN_INJECTOR | SUBCUTANEOUS | 5 refills | Status: DC

## 2020-08-16 NOTE — Telephone Encounter (Signed)
    Patient calling to report pharmacy is requesting prior auth for Liraglutide -Weight Management (SAXENDA) 18 MG/3ML SOPN

## 2020-08-17 ENCOUNTER — Encounter: Payer: Self-pay | Admitting: Internal Medicine

## 2020-08-17 NOTE — Telephone Encounter (Signed)
Patient notified to follow ENT recommendations.

## 2020-08-18 ENCOUNTER — Telehealth: Payer: Self-pay

## 2020-08-18 MED ORDER — FLUCONAZOLE 150 MG PO TABS: 150.0000 mg | ORAL_TABLET | Freq: Once | ORAL | 0 refills | Status: AC

## 2020-08-18 NOTE — Telephone Encounter (Signed)
[name] (Key: [id number])  Your information has been submitted to [facility]. To check for an updated outcome later, reopen this PA request from your dashboard.  If [facility] has not responded to your request within 24 hours, contact [place] at [phone]. If you think there may be a problem with your PA request, use our live chat feature at the bottom right.

## 2020-08-19 ENCOUNTER — Ambulatory Visit: Payer: BC Managed Care – PPO | Admitting: Family Medicine

## 2020-08-19 MED ORDER — UNIFINE PENTIPS 31G X 6 MM MISC: 5 refills | Status: DC

## 2020-08-19 NOTE — Addendum Note (Signed)
Addended by: Binnie Rail on: 08/19/2020 03:04 PM   Modules accepted: Orders

## 2020-08-20 ENCOUNTER — Other Ambulatory Visit: Payer: Self-pay | Admitting: Internal Medicine

## 2020-09-20 ENCOUNTER — Encounter (INDEPENDENT_AMBULATORY_CARE_PROVIDER_SITE_OTHER): Payer: Self-pay | Admitting: Family Medicine

## 2020-09-20 ENCOUNTER — Ambulatory Visit (INDEPENDENT_AMBULATORY_CARE_PROVIDER_SITE_OTHER): Payer: BC Managed Care – PPO | Admitting: Family Medicine

## 2020-09-20 ENCOUNTER — Other Ambulatory Visit: Payer: Self-pay

## 2020-09-20 VITALS — BP 122/79 | HR 70 | Temp 98.1°F | Ht 66.0 in | Wt 207.0 lb

## 2020-09-20 DIAGNOSIS — Z6833 Body mass index (BMI) 33.0-33.9, adult: Secondary | ICD-10-CM

## 2020-09-20 DIAGNOSIS — R7303 Prediabetes: Secondary | ICD-10-CM

## 2020-09-20 DIAGNOSIS — Z1331 Encounter for screening for depression: Secondary | ICD-10-CM | POA: Diagnosis not present

## 2020-09-20 DIAGNOSIS — G4733 Obstructive sleep apnea (adult) (pediatric): Secondary | ICD-10-CM

## 2020-09-20 DIAGNOSIS — R0602 Shortness of breath: Secondary | ICD-10-CM

## 2020-09-20 DIAGNOSIS — R5383 Other fatigue: Secondary | ICD-10-CM

## 2020-09-20 DIAGNOSIS — E7849 Other hyperlipidemia: Secondary | ICD-10-CM

## 2020-09-20 DIAGNOSIS — E669 Obesity, unspecified: Secondary | ICD-10-CM

## 2020-09-20 DIAGNOSIS — I10 Essential (primary) hypertension: Secondary | ICD-10-CM | POA: Diagnosis not present

## 2020-09-20 DIAGNOSIS — Z9189 Other specified personal risk factors, not elsewhere classified: Secondary | ICD-10-CM | POA: Diagnosis not present

## 2020-09-20 DIAGNOSIS — Z0289 Encounter for other administrative examinations: Secondary | ICD-10-CM

## 2020-09-20 DIAGNOSIS — Z9989 Dependence on other enabling machines and devices: Secondary | ICD-10-CM

## 2020-09-20 NOTE — Progress Notes (Unsigned)
Dear Dr. Quay Burow,   Thank you for referring [name] [name] to our clinic. The following note includes my evaluation and treatment recommendations.  Chief Complaint:   OBESITY [name] [name] (MR# [id number]) is a 60 y.o. female who presents for evaluation and treatment of obesity and related comorbidities. Current BMI is Body mass index is 33.41 kg/m. [name] [name] has been struggling with her weight for many years and has been unsuccessful in either losing weight, maintaining weight loss, or reaching her healthy weight goal.  [name] [name] is currently in the action stage of change and ready to dedicate time achieving and maintaining a healthier weight. [name] [name] is interested in becoming our patient and working on intensive lifestyle modifications including (but not limited to) diet and exercise for weight loss.  [name] [name] was referred by Dr. Quay Burow.  She is on Saxenda 1.2 mg subcutaneously daily (due for increase).  She says she eats out 5 times a week between lunch and dinner.  She says she does like leftovers, however.  For breakfast, she will have fried bologna, 2 scrambled eggs, and maybe cranberry juice (feels satisfied).  For a snack, kale chips or seaweed or popcorn.  Lunch will be 2 cups of spaghetti leftovers and a Little Debbie cake (feels full).  She will have a snack consisting of potato chips.  Dinner will be [place] Mike[employer] salad with ham, [place], lettuce, tomato, and banana peppers.  [name] [name]'s habits were reviewed today and are as follows: her desired weight loss is 29 pounds, her heaviest weight ever was 226 pounds, she craves meat, she snacks frequently in the evenings, she frequently eats larger portions than normal and she struggles with emotional eating.  Depression Screen [name] [name]'s Food and Mood (modified PHQ-9) score was 5.  Depression screen PHQ 2/9 09/20/2020  Decreased Interest 1  Down, Depressed, Hopeless 1  PHQ - 2 Score 2  Altered sleeping 1  Tired, decreased  energy 0  Change in appetite 1  Feeling bad or failure about yourself  1  Trouble concentrating 0  Moving slowly or fidgety/restless 0  Suicidal thoughts 0  PHQ-9 Score 5  Difficult doing work/chores Not difficult at all  Some recent data might be hidden   Subjective:   1. Other fatigue Gwennette denies daytime somnolence and reports waking up still tired. Patent has a history of symptoms of daytime fatigue, morning fatigue, morning headache and snoring. Yashvi generally gets 5 hours of sleep per night, and states that she has poor quality sleep. Snoring is present. Apneic episodes are present. Epworth Sleepiness Score is 1.  2. SOB (shortness of breath) on exertion [name] [name] notes increasing shortness of breath with exercising and seems to be worsening over time with weight gain. She notes getting out of breath sooner with activity than she used to. This has not gotten worse recently. [name] [name] denies shortness of breath at rest or orthopnea.  3. Essential hypertension Review: taking medications as instructed, no medication side effects noted, no chest pain on exertion, no dyspnea on exertion, no swelling of ankles.  She was diagnosed many years ago.  She is on metoprolol, losartan, and HCTZ.  BP Readings from Last 3 Encounters:  09/20/20 122/79  07/28/20 140/82  05/21/20 124/84   4. OSA on CPAP Velora has a diagnosis of sleep apnea.  She does not have good compliance right now secondary to upper respiratory symptoms.   5. Prediabetes [name] [name] has a diagnosis of prediabetes based on her elevated HgA1c and was informed this puts her  at greater risk of developing diabetes. She continues to work on diet and exercise to decrease her risk of diabetes. She denies nausea or hypoglycemia.  A1c 5.9 12 years ago.  She is not on medications.  Lab Results  Component Value Date   HGBA1C 5.2 12/08/2019   Lab Results  Component Value Date   INSULIN 18.1 02/21/2017   6. Other  hyperlipidemia [name] [name] has hyperlipidemia and has been trying to improve her cholesterol levels with intensive lifestyle modification including a low saturated fat diet, exercise and weight loss. She denies any chest pain, claudication or myalgias.  Last LDL 120.  She is not on a statin.  Lab Results  Component Value Date   ALT 21 03/25/2020   AST 17 03/25/2020   ALKPHOS 81 03/25/2020   BILITOT 0.5 03/25/2020   Lab Results  Component Value Date   CHOL 213 (H) 12/08/2019   HDL 50.10 12/08/2019   LDLCALC 121 (H) 10/16/2018   LDLDIRECT 120.0 12/08/2019   TRIG 222.0 (H) 12/08/2019   CHOLHDL 4 12/08/2019   7. Depression screening Virgie was screened for depression as part of her new patient workup today.  PHQ-9 is 5.  8. At risk for diabetes mellitus Marene is at higher than average risk for developing diabetes due to obesity.   Assessment/Plan:   1. Other fatigue [name] [name] does not feel that her weight is causing her energy to be lower than it should be. Fatigue may be related to obesity, depression or many other causes. Labs will be ordered, and in the meanwhile, [name] [name] will focus on self care including making healthy food choices, increasing physical activity and focusing on stress reduction.  - EKG 12-Lead - Vitamin B12 - Folate - T3 - T4 - TSH - VITAMIN D 25 Hydroxy (Vit-D Deficiency, Fractures)  2. SOB (shortness of breath) on exertion [name] [name] does not feel that she gets out of breath more easily that she used to when she exercises. [name] [name]'s shortness of breath appears to be obesity related and exercise induced. She has agreed to work on weight loss and gradually increase exercise to treat her exercise induced shortness of breath. Will continue to monitor closely.  - CBC with Differential/Platelet  3. Essential hypertension [name] [name] is working on healthy weight loss and exercise to improve blood pressure control. We will watch for signs of hypotension as  she continues her lifestyle modifications.  Follow-up with Dr. Radford Pax.  Will check CMP, EKG, FLP today.  - Comprehensive metabolic panel - Lipid Panel With LDL/HDL Ratio  4. OSA on CPAP Intensive lifestyle modifications are the first line treatment for this issue. We discussed several lifestyle modifications today and she will continue to work on diet, exercise and weight loss efforts. We will continue to monitor. Orders and follow up as documented in patient record.  Follow-up at next appointment.   5. Prediabetes Rease will continue to work on weight loss, exercise, and decreasing simple carbohydrates to help decrease the risk of diabetes.  Will check A1c and insulin level today.  - Hemoglobin A1c - Insulin, random  6. Other hyperlipidemia Cardiovascular risk and specific lipid/LDL goals reviewed.  We discussed several lifestyle modifications today and [name] [name] will continue to work on diet, exercise and weight loss efforts. Orders and follow up as documented in patient record.  Will check FLP today.  Counseling Intensive lifestyle modifications are the first line treatment for this issue. . Dietary changes: Increase soluble fiber. Decrease simple carbohydrates. . Exercise changes: Moderate to vigorous-intensity  aerobic activity 150 minutes per week if tolerated. . Lipid-lowering medications: see documented in medical record.  7. Depression screening [name] [name]'s depression screening was negative today.  8. At risk for diabetes mellitus [name] [name] was given approximately 15 minutes of diabetes education and counseling today. We discussed intensive lifestyle modifications today with an emphasis on weight loss as well as increasing exercise and decreasing simple carbohydrates in her diet. We also reviewed medication options with an emphasis on risk versus benefit of those discussed.   Repetitive spaced learning was employed today to elicit superior memory formation and behavioral  change.  9. Class 1 obesity with serious comorbidity and body mass index (BMI) of 33.0 to 33.9 in adult, unspecified obesity type  [name] [name] is currently in the action stage of change and her goal is to continue with weight loss efforts. I recommend [name] [name] begin the structured treatment plan as follows:  She has agreed to the Category 2 Plan.  Exercise goals: No exercise has been prescribed at this time.   Behavioral modification strategies: increasing lean protein intake, meal planning and cooking strategies and planning for success.  She was informed of the importance of frequent follow-up visits to maximize her success with intensive lifestyle modifications for her multiple health conditions. She was informed we would discuss her lab results at her next visit unless there is a critical issue that needs to be addressed sooner. [name] [name] agreed to keep her next visit at the agreed upon time to discuss these results.  Objective:   Blood pressure 122/79, pulse 70, temperature 98.1 F (36.7 C), temperature source Oral, height 5\' 6"  (1.676 m), weight 207 lb (93.9 kg), last menstrual period 11/01/2012, SpO2 97 %. Body mass index is 33.41 kg/m.  EKG: Normal sinus rhythm, rate 85 bpm.  Indirect Calorimeter completed today shows a VO2 of 220 and a REE of 1529.  Her calculated basal metabolic rate is 11/03/2012 thus her basal metabolic rate is worse than expected.  General: Cooperative, alert, well developed, in no acute distress. HEENT: Conjunctivae and lids unremarkable. Cardiovascular: Regular rhythm.  Lungs: Normal work of breathing. Neurologic: No focal deficits.   Lab Results  Component Value Date   CREATININE 0.91 03/25/2020   BUN 10 03/25/2020   NA 142 03/25/2020   K 4.8 03/25/2020   CL 105 03/25/2020   CO2 27 03/25/2020   Lab Results  Component Value Date   ALT 21 03/25/2020   AST 17 03/25/2020   ALKPHOS 81 03/25/2020   BILITOT 0.5 03/25/2020   Lab Results  Component  Value Date   HGBA1C 5.2 12/08/2019   HGBA1C 5.4 02/21/2017   HGBA1C 5.9 12/30/2007   Lab Results  Component Value Date   INSULIN 18.1 02/21/2017   Lab Results  Component Value Date   TSH 1.80 12/08/2019   Lab Results  Component Value Date   CHOL 213 (H) 12/08/2019   HDL 50.10 12/08/2019   LDLCALC 121 (H) 10/16/2018   LDLDIRECT 120.0 12/08/2019   TRIG 222.0 (H) 12/08/2019   CHOLHDL 4 12/08/2019   Lab Results  Component Value Date   WBC 5.8 03/25/2020   HGB 13.2 03/25/2020   HCT 38.9 03/25/2020   MCV 87.4 03/25/2020   PLT 275 03/25/2020   Lab Results  Component Value Date   IRON 89 10/09/2017   FERRITIN 401.1 (H) 10/09/2017   Attestation Statements:   Reviewed by clinician on day of visit: allergies, medications, problem list, medical history, surgical history, family history, social history, and previous  encounter notes. This is the patient's first visit at [facility]. The patient's NEW PATIENT PACKET was reviewed at length. Included in the packet: current and past health history, medications, allergies, ROS, gynecologic history (women only), surgical history, family history, social history, weight history, weight loss surgery history (for those that have had weight loss surgery), nutritional evaluation, mood and food questionnaire, PHQ9, Epworth questionnaire, sleep habits questionnaire, patient life and health improvement goals questionnaire. These will all be scanned into the patient's chart under media.   During the visit, I independently reviewed the patient's EKG, bioimpedance scale results, and indirect calorimeter results. I used this information to tailor a meal plan for the patient that will help her to lose weight and will improve her obesity-related conditions going forward. I performed a medically necessary appropriate examination and/or evaluation. I discussed the assessment and treatment plan with the patient. The patient was provided an  opportunity to ask questions and all were answered. The patient agreed with the plan and demonstrated an understanding of the instructions. Labs were ordered at this visit and will be reviewed at the next visit unless more critical results need to be addressed immediately. Clinical information was updated and documented in the EMR.   Time spent on visit including pre-visit chart review and post-visit care was 45 minutes.   A separate 15 minutes was spent on risk counseling (see above).   I, Water quality scientist, CMA, am acting as transcriptionist for Coralie Common, MD.  I have reviewed the above documentation for accuracy and completeness, and I agree with the above. - Jinny Blossom, MD

## 2020-09-21 ENCOUNTER — Telehealth: Payer: Self-pay | Admitting: *Deleted

## 2020-09-21 LAB — COMPREHENSIVE METABOLIC PANEL
ALT: 21 IU/L (ref 0–32)
AST: 26 IU/L (ref 0–40)
Albumin/Globulin Ratio: 1.7 (ref 1.2–2.2)
Albumin: 4.8 g/dL (ref 3.8–4.9)
Alkaline Phosphatase: 91 IU/L (ref 44–121)
BUN/Creatinine Ratio: 11 (ref 9–23)
BUN: 9 mg/dL (ref 6–24)
Bilirubin Total: 0.5 mg/dL (ref 0.0–1.2)
CO2: 20 mmol/L (ref 20–29)
Calcium: 10.3 mg/dL — ABNORMAL HIGH (ref 8.7–10.2)
Chloride: 99 mmol/L (ref 96–106)
Creatinine, Ser: 0.81 mg/dL (ref 0.57–1.00)
GFR calc Af Amer: 93 mL/min/{1.73_m2} (ref >59)
GFR calc non Af Amer: 80 mL/min/{1.73_m2} (ref >59)
Globulin, Total: 2.8 g/dL (ref 1.5–4.5)
Glucose: 78 mg/dL (ref 65–99)
Potassium: 4.1 mmol/L (ref 3.5–5.2)
Sodium: 136 mmol/L (ref 134–144)
Total Protein: 7.6 g/dL (ref 6.0–8.5)

## 2020-09-21 LAB — CBC WITH DIFFERENTIAL/PLATELET
Basophils Absolute: 0 10*3/uL (ref 0.0–0.2)
Basos: 1 %
EOS (ABSOLUTE): 0.1 10*3/uL (ref 0.0–0.4)
Eos: 2 %
Hematocrit: 41.1 % (ref 34.0–46.6)
Hemoglobin: 13.8 g/dL (ref 11.1–15.9)
Immature Grans (Abs): 0 10*3/uL (ref 0.0–0.1)
Immature Granulocytes: 0 %
Lymphocytes Absolute: 2.8 10*3/uL (ref 0.7–3.1)
Lymphs: 41 %
MCH: 29.7 pg (ref 26.6–33.0)
MCHC: 33.6 g/dL (ref 31.5–35.7)
MCV: 88 fL (ref 79–97)
Monocytes Absolute: 0.5 10*3/uL (ref 0.1–0.9)
Monocytes: 7 %
Neutrophils Absolute: 3.3 10*3/uL (ref 1.4–7.0)
Neutrophils: 49 %
Platelets: 239 10*3/uL (ref 150–450)
RBC: 4.65 x10E6/uL (ref 3.77–5.28)
RDW: 14.4 % (ref 11.7–15.4)
WBC: 6.8 10*3/uL (ref 3.4–10.8)

## 2020-09-21 LAB — LIPID PANEL WITH LDL/HDL RATIO
Cholesterol, Total: 215 mg/dL — ABNORMAL HIGH (ref 100–199)
HDL: 51 mg/dL (ref >39)
LDL Chol Calc (NIH): 133 mg/dL — ABNORMAL HIGH (ref 0–99)
LDL/HDL Ratio: 2.6 ratio (ref 0.0–3.2)
Triglycerides: 175 mg/dL — ABNORMAL HIGH (ref 0–149)
VLDL Cholesterol Cal: 31 mg/dL (ref 5–40)

## 2020-09-21 LAB — FOLATE: Folate: >20 ng/mL (ref >3.0)

## 2020-09-21 LAB — HEMOGLOBIN A1C
Est. average glucose Bld gHb Est-mCnc: 105 mg/dL
Hgb A1c MFr Bld: 5.3 % (ref 4.8–5.6)

## 2020-09-21 LAB — TSH: TSH: 1.65 u[IU]/mL (ref 0.450–4.500)

## 2020-09-21 LAB — T4: T4, Total: 7.8 ug/dL (ref 4.5–12.0)

## 2020-09-21 LAB — INSULIN, RANDOM: INSULIN: 4.6 u[IU]/mL (ref 2.6–24.9)

## 2020-09-21 LAB — VITAMIN B12: Vitamin B-12: 1165 pg/mL (ref 232–1245)

## 2020-09-21 LAB — T3: T3, Total: 122 ng/dL (ref 71–180)

## 2020-09-21 LAB — VITAMIN D 25 HYDROXY (VIT D DEFICIENCY, FRACTURES): Vit D, 25-Hydroxy: 53 ng/mL (ref 30.0–100.0)

## 2020-09-21 NOTE — Telephone Encounter (Signed)
"  [name] [name] [phone]).  Have an FMLA form for my daughter.  What is the fax number to send FMLA forms?  This a renewal; she takes me to my appointments.  Call me for further questions.  I will pick up the form."  Awaiting form to queue for completion.  Provided fax number: [phone].

## 2020-09-22 NOTE — Telephone Encounter (Signed)
[id number].  Fax received from [employer].

## 2020-09-24 ENCOUNTER — Other Ambulatory Visit: Payer: Self-pay | Admitting: Internal Medicine

## 2020-09-24 DIAGNOSIS — Z1231 Encounter for screening mammogram for malignant neoplasm of breast: Secondary | ICD-10-CM

## 2020-09-28 ENCOUNTER — Telehealth: Payer: Self-pay | Admitting: *Deleted

## 2020-09-28 NOTE — Telephone Encounter (Signed)
This RN called the patient per FMLA form request for her daughter [name] [name].  This RN informed pt she has completed her care per this office ( as of July 2021) therefore we cannot complete form.  [name] [name] verbalized understanding of above- no further needs at this time.

## 2020-09-29 ENCOUNTER — Encounter: Payer: Self-pay | Admitting: Internal Medicine

## 2020-09-29 ENCOUNTER — Other Ambulatory Visit: Payer: Self-pay | Admitting: Internal Medicine

## 2020-10-01 ENCOUNTER — Telehealth: Payer: Self-pay | Admitting: Internal Medicine

## 2020-10-01 NOTE — Telephone Encounter (Signed)
I typically do not see her much - often 2/year --- so we can say 1 every 3 months to be on the safe side.  Thanks.

## 2020-10-01 NOTE — Telephone Encounter (Signed)
I received FMLA. Dr.Burns this will be for intermitting leave for taking patient to Dr.Burns appointment.  What frequency are ok with? Looking at her LOV It seems patient goes to the doctor maybe 1 every 1 to 3 months. 1 x every 1 to 68months?

## 2020-10-01 NOTE — Telephone Encounter (Signed)
  Paperwork she was going to pick up regarding  intermittant FML for her daughter, they have a deadline  Can it be faxed to [phone]  Please follow-up with the patient

## 2020-10-01 NOTE — Telephone Encounter (Signed)
Forms have been completed & Placed in providers box to review and sign.  

## 2020-10-04 ENCOUNTER — Telehealth (INDEPENDENT_AMBULATORY_CARE_PROVIDER_SITE_OTHER): Payer: BC Managed Care – PPO | Admitting: Family Medicine

## 2020-10-04 ENCOUNTER — Ambulatory Visit: Payer: Self-pay | Admitting: Internal Medicine

## 2020-10-04 ENCOUNTER — Other Ambulatory Visit: Payer: Self-pay

## 2020-10-04 ENCOUNTER — Encounter (INDEPENDENT_AMBULATORY_CARE_PROVIDER_SITE_OTHER): Payer: Self-pay | Admitting: Family Medicine

## 2020-10-04 ENCOUNTER — Telehealth (INDEPENDENT_AMBULATORY_CARE_PROVIDER_SITE_OTHER): Payer: Self-pay

## 2020-10-04 DIAGNOSIS — E7849 Other hyperlipidemia: Secondary | ICD-10-CM

## 2020-10-04 DIAGNOSIS — Z6833 Body mass index (BMI) 33.0-33.9, adult: Secondary | ICD-10-CM

## 2020-10-04 DIAGNOSIS — E559 Vitamin D deficiency, unspecified: Secondary | ICD-10-CM

## 2020-10-04 DIAGNOSIS — R7303 Prediabetes: Secondary | ICD-10-CM

## 2020-10-04 DIAGNOSIS — E669 Obesity, unspecified: Secondary | ICD-10-CM

## 2020-10-04 NOTE — Telephone Encounter (Signed)
I connected with  [name] [name] on 10/04/20 by a video enabled telemedicine application and verified that I am speaking with the correct person using two identifiers.   I discussed the limitations of evaluation and management by telemedicine. The patient expressed understanding and agreed to proceed.

## 2020-10-05 NOTE — Telephone Encounter (Signed)
Forms have been signed, Faxed, Copy sent to scan &Charged for under daughter.  Tonya informed and will pickup original.

## 2020-10-06 ENCOUNTER — Encounter: Payer: Self-pay | Admitting: Internal Medicine

## 2020-10-06 MED ORDER — RIVAROXABAN 20 MG PO TABS
ORAL_TABLET | ORAL | 1 refills | Status: DC
Start: 2020-10-06 — End: 2020-10-08

## 2020-10-06 NOTE — Progress Notes (Signed)
TeleHealth Visit:  Due to the COVID-19 pandemic, this visit was completed with telemedicine (audio/video) technology to reduce patient and provider exposure as well as to preserve personal protective equipment.   Amillion has verbally consented to this TeleHealth visit. The patient is located at home, the provider is located at the [facility] office. The participants in this visit include the listed provider and patient. The visit was conducted today via MyChart video.  Chief Complaint: OBESITY Senora is here to discuss her progress with her obesity treatment plan along with follow-up of her obesity related diagnoses. Makaiyah is on the Category 2 Plan and states she is following her eating plan approximately 100% of the time. Caree states she is walking for 10-15 minutes 3 times per week.  Today's visit was #: 2 Starting weight: 207 lbs Starting date: 09/20/2020  Interim History: Merrin finds eating all food difficult.  She does not like bread and is forcing herself to eat.  Denies hunger and is barely snacking.  She is weighing her meat and is getting all her protein in.  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  She says she does not want to make any changes.  Denies cravings.  Subjective:   1. Serum calcium elevated Last calcium 10.3 (minimal elevation).  She is on HCTZ for diuresis.  2. Other hyperlipidemia Elianne has hyperlipidemia and has been trying to improve her cholesterol levels with intensive lifestyle modification including a low saturated fat diet, exercise and weight loss. She denies any chest pain, claudication or myalgias.  Could not risk stratify due to virtual visit.  She is not on a statin.  Lab Results  Component Value Date   ALT 21 09/20/2020   AST 26 09/20/2020   ALKPHOS 91 09/20/2020   BILITOT 0.5 09/20/2020   Lab Results  Component Value Date   CHOL 215 (H) 09/20/2020   HDL 51 09/20/2020   LDLCALC 133 (H)  09/20/2020   LDLDIRECT 120.0 12/08/2019   TRIG 175 (H) 09/20/2020   CHOLHDL 4 12/08/2019   3. Vitamin D deficiency Hannah's Vitamin D level was 53.0 on 09/20/2020. She is currently taking OTC vitamin D 2,000 IU each day. She denies nausea, vomiting or muscle weakness.  She endorses fatigue.  4. Prediabetes Marysue has a diagnosis of prediabetes based on her elevated HgA1c and was informed this puts her at greater risk of developing diabetes. She continues to work on diet and exercise to decrease her risk of diabetes. She denies nausea or hypoglycemia.  She is on a GLP-1.  Lab Results  Component Value Date   HGBA1C 5.3 09/20/2020   Lab Results  Component Value Date   INSULIN 4.6 09/20/2020   INSULIN 18.1 02/21/2017   Assessment/Plan:   1. Serum calcium elevated Repeat labs in 3 months.   2. Other hyperlipidemia Cardiovascular risk and specific lipid/LDL goals reviewed.  We discussed several lifestyle modifications today and Berklee will continue to work on diet, exercise and weight loss efforts. Orders and follow up as documented in patient record.  Repeat labs in 3 months.  Counseling Intensive lifestyle modifications are the first line treatment for this issue. . Dietary changes: Increase soluble fiber. Decrease simple carbohydrates. . Exercise changes: Moderate to vigorous-intensity aerobic activity 150 minutes per week if tolerated. . Lipid-lowering medications: see documented in medical record.  3. Vitamin D deficiency Low Vitamin D level contributes to fatigue and are associated with obesity, breast, and colon cancer.  She agrees to continue to take OTC Vitamin D daily.  4. Prediabetes Dezyre will continue to work on weight loss, exercise, and decreasing simple carbohydrates to help decrease the risk of diabetes.  Continue GLP-1.  No change in dose.  5. Class 1 obesity with serious comorbidity and body mass index (BMI) of 33.0 to 33.9 in adult, unspecified obesity  type  Catilyn is currently in the action stage of change. As such, her goal is to continue with weight loss efforts. She has agreed to the Category 2 Plan.   Exercise goals: As is.  Behavioral modification strategies: increasing lean protein intake, meal planning and cooking strategies, keeping healthy foods in the home and planning for success.  Kaytelyn has agreed to follow-up with our clinic in 2 weeks. She was informed of the importance of frequent follow-up visits to maximize her success with intensive lifestyle modifications for her multiple health conditions.  Objective:   VITALS: Per patient if applicable, see vitals. GENERAL: Alert and in no acute distress. CARDIOPULMONARY: No increased WOB. Speaking in clear sentences.  PSYCH: Pleasant and cooperative. Speech normal rate and rhythm. Affect is appropriate. Insight and judgement are appropriate. Attention is focused, linear, and appropriate.  NEURO: Oriented as arrived to appointment on time with no prompting.   Lab Results  Component Value Date   CREATININE 0.81 09/20/2020   BUN 9 09/20/2020   NA 136 09/20/2020   K 4.1 09/20/2020   CL 99 09/20/2020   CO2 20 09/20/2020   Lab Results  Component Value Date   ALT 21 09/20/2020   AST 26 09/20/2020   ALKPHOS 91 09/20/2020   BILITOT 0.5 09/20/2020   Lab Results  Component Value Date   HGBA1C 5.3 09/20/2020   HGBA1C 5.2 12/08/2019   HGBA1C 5.4 02/21/2017   HGBA1C 5.9 12/30/2007   Lab Results  Component Value Date   INSULIN 4.6 09/20/2020   INSULIN 18.1 02/21/2017   Lab Results  Component Value Date   TSH 1.650 09/20/2020   Lab Results  Component Value Date   CHOL 215 (H) 09/20/2020   HDL 51 09/20/2020   LDLCALC 133 (H) 09/20/2020   LDLDIRECT 120.0 12/08/2019   TRIG 175 (H) 09/20/2020   CHOLHDL 4 12/08/2019   Lab Results  Component Value Date   WBC 6.8 09/20/2020   HGB 13.8 09/20/2020   HCT 41.1 09/20/2020   MCV 88 09/20/2020   PLT 239 09/20/2020    Lab Results  Component Value Date   IRON 89 10/09/2017   FERRITIN 401.1 (H) 10/09/2017   Attestation Statements:   Reviewed by clinician on day of visit: allergies, medications, problem list, medical history, surgical history, family history, social history, and previous encounter notes.  I, Water quality scientist, CMA, am acting as Location manager for [employer], DO  I have reviewed the above documentation for accuracy and completeness, and I agree with the above. - Jinny Blossom, MD

## 2020-10-07 NOTE — Patient Instructions (Addendum)
Blood work was reviewed.     No immunization administered today.   Medications changes include :   none  Your prescription(s) have been submitted to your pharmacy.      Please followup in 6 months    Health Maintenance, Female Adopting a healthy lifestyle and getting preventive care are important in promoting health and wellness. Ask your health care provider about:  The right schedule for you to have regular tests and exams.  Things you can do on your own to prevent diseases and keep yourself healthy. What should I know about diet, weight, and exercise? Eat a healthy diet  Eat a diet that includes plenty of vegetables, fruits, low-fat dairy products, and lean protein.  Do not eat a lot of foods that are high in solid fats, added sugars, or sodium.   Maintain a healthy weight Body mass index (BMI) is used to identify weight problems. It estimates body fat based on height and weight. Your health care provider can help determine your BMI and help you achieve or maintain a healthy weight. Get regular exercise Get regular exercise. This is one of the most important things you can do for your health. Most adults should:  Exercise for at least 150 minutes each week. The exercise should increase your heart rate and make you sweat (moderate-intensity exercise).  Do strengthening exercises at least twice a week. This is in addition to the moderate-intensity exercise.  Spend less time sitting. Even light physical activity can be beneficial. Watch cholesterol and blood lipids Have your blood tested for lipids and cholesterol at 59 years of age, then have this test every 5 years. Have your cholesterol levels checked more often if:  Your lipid or cholesterol levels are high.  You are older than 59 years of age.  You are at high risk for heart disease. What should I know about cancer screening? Depending on your health history and family history, you may need to have cancer  screening at various ages. This may include screening for:  Breast cancer.  Cervical cancer.  Colorectal cancer.  Skin cancer.  Lung cancer. What should I know about heart disease, diabetes, and high blood pressure? Blood pressure and heart disease  High blood pressure causes heart disease and increases the risk of stroke. This is more likely to develop in people who have high blood pressure readings, are of African descent, or are overweight.  Have your blood pressure checked: ? Every 3-5 years if you are 60-48 years of age. ? Every year if you are 58 years old or older. Diabetes Have regular diabetes screenings. This checks your fasting blood sugar level. Have the screening done:  Once every three years after age 23 if you are at a normal weight and have a low risk for diabetes.  More often and at a younger age if you are overweight or have a high risk for diabetes. What should I know about preventing infection? Hepatitis B If you have a higher risk for hepatitis B, you should be screened for this virus. Talk with your health care provider to find out if you are at risk for hepatitis B infection. Hepatitis C Testing is recommended for:  Everyone born from [birth year] through 1964.  Anyone with known risk factors for hepatitis C. Sexually transmitted infections (STIs)  Get screened for STIs, including gonorrhea and chlamydia, if: ? You are sexually active and are younger than 59 years of age. ? You are older than 59 years of age  and your health care provider tells you that you are at risk for this type of infection. ? Your sexual activity has changed since you were last screened, and you are at increased risk for chlamydia or gonorrhea. Ask your health care provider if you are at risk.  Ask your health care provider about whether you are at high risk for HIV. Your health care provider may recommend a prescription medicine to help prevent HIV infection. If you choose to take  medicine to prevent HIV, you should first get tested for HIV. You should then be tested every 3 months for as long as you are taking the medicine. Pregnancy  If you are about to stop having your period (premenopausal) and you may become pregnant, seek counseling before you get pregnant.  Take 400 to 800 micrograms (mcg) of folic acid every day if you become pregnant.  Ask for birth control (contraception) if you want to prevent pregnancy. Osteoporosis and menopause Osteoporosis is a disease in which the bones lose minerals and strength with aging. This can result in bone fractures. If you are 11 years old or older, or if you are at risk for osteoporosis and fractures, ask your health care provider if you should:  Be screened for bone loss.  Take a calcium or vitamin D supplement to lower your risk of fractures.  Be given hormone replacement therapy (HRT) to treat symptoms of menopause. Follow these instructions at home: Lifestyle  Do not use any products that contain nicotine or tobacco, such as cigarettes, e-cigarettes, and chewing tobacco. If you need help quitting, ask your health care provider.  Do not use street drugs.  Do not share needles.  Ask your health care provider for help if you need support or information about quitting drugs. Alcohol use  Do not drink alcohol if: ? Your health care provider tells you not to drink. ? You are pregnant, may be pregnant, or are planning to become pregnant.  If you drink alcohol: ? Limit how much you use to 0-1 drink a day. ? Limit intake if you are breastfeeding.  Be aware of how much alcohol is in your drink. In the [place] one drink equals one 12 oz bottle of beer (355 mL), one 5 oz glass of wine (148 mL), or one 1 oz glass of hard liquor (44 mL). General instructions  Schedule regular health, dental, and eye exams.  Stay current with your vaccines.  Tell your health care provider if: ? You often feel depressed. ? You have  ever been abused or do not feel safe at home. Summary  Adopting a healthy lifestyle and getting preventive care are important in promoting health and wellness.  Follow your health care provider's instructions about healthy diet, exercising, and getting tested or screened for diseases.  Follow your health care provider's instructions on monitoring your cholesterol and blood pressure. This information is not intended to replace advice given to you by your health care provider. Make sure you discuss any questions you have with your health care provider. Document Revised: 08/28/2018 Document Reviewed: 08/28/2018 Elsevier Patient Education  2021 [employer].

## 2020-10-07 NOTE — Progress Notes (Signed)
Subjective:    Patient ID: [name] [name], female    DOB: 1961-04-04, 60 y.o.   MRN: [id number]   This visit occurred during the SARS-CoV-2 public health emergency.  Safety protocols were in place, including screening questions prior to the visit, additional usage of staff PPE, and extensive cleaning of exam room while observing appropriate contact time as indicated for disinfecting solutions.    HPI She is here for a physical exam.   She is using saxenda.  She is not at the full dose.  [facility]    Medications and allergies reviewed with patient and updated if appropriate.  Patient Active Problem List   Diagnosis Date Noted   Disorder of SI (sacroiliac) joint 07/28/2020   Abnormal urine odor 07/28/2020   Right-sided chest wall pain 12/22/2019   AC (acromioclavicular) arthritis 10/09/2019   Acute pain of right shoulder 09/04/2019   Atrial flutter (HCC) 02/28/2019   RLS (restless legs syndrome) 11/21/2018   Sleep difficulties 08/06/2018   Trigger finger 01/23/2018   Meralgia paraesthetica, right 11/05/2017   Iron deficiency 10/09/2017   Vitamin D deficiency 06/15/2017   Lumbar radiculopathy 04/21/2017   Trigger index finger of left hand 04/21/2017   Morbid obesity (HCC) 04/21/2017   Left hip pain 10/31/2016   Pes planus 10/31/2016   Posterior tibialis muscle dysfunction 10/31/2016   Breast pain, right 08/24/2016   Palpitation 04/04/2016   Hand arthritis 04/04/2016   Malignant neoplasm of upper-outer quadrant of right breast in female, 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/03/2015   S/P mastectomy 05/13/2015   Chemotherapy-induced neuropathy (HCC) 04/21/2015   Genetic testing 12/11/2014   Breast cancer of upper-outer quadrant of right female breast (HCC) 10/23/2014   Allergic rhinitis 11/29/2009   KELOID 11/05/2009   CONSTIPATION, CHRONIC 08/17/2008   METABOLIC SYNDROME X 01/07/2008    GERD 01/07/2008   Essential hypertension 09/20/2007   Multiple sclerosis (HCC) 09/18/2007    Current Outpatient Medications on File Prior to Visit  Medication Sig Dispense Refill   azelastine (ASTELIN) 0.1 % nasal spray azelastine 137 mcg (0.1 %) nasal spray aerosol 30 mL 3   b complex vitamins tablet Take 1 tablet by mouth daily.     BD PEN NEEDLE MICRO U/F 32G X 6 MM MISC Inject into the skin.     COPAXONE 40 MG/ML SOSY INJECT ONE SYRINGE SUBCUTANEOUSLY THREE TIMES PER WEEK AT LEAST 48 HOURS APART. ALLOW SYRINGE TO WARM TO ROOM TEMPERATURE FOR 20 MINUTES. REFRIGERATE. 36 mL 2   ferrous gluconate (FERGON) 324 MG tablet Take 1 tablet (324 mg total) by mouth 2 (two) times daily with a meal. (Patient taking differently: Take 324 mg by mouth 3 (three) times a week.) 180 tablet 3   fluticasone (FLONASE) 50 MCG/ACT nasal spray SHAKE LIQUID AND USE 2 SPRAYS IN EACH NOSTRIL DAILY 16 g 6   gabapentin (NEURONTIN) 100 MG capsule Take 3 capsules (300 mg total) by mouth 3 (three) times daily. 810 capsule 1   hydrochlorothiazide (HYDRODIURIL) 25 MG tablet Take 25 mg by mouth daily.     Insulin Pen Needle (UNIFINE PENTIPS) 31G X 6 MM MISC Use daily as directly for saxenda injection 30 each 5   KLOR-CON M10 10 MEQ tablet TAKE 4 TABLETS DAILY 120 tablet 1   linaclotide (LINZESS) 290 MCG CAPS capsule Take 1 capsule (290 mcg total) by mouth daily before breakfast. 42 capsule 0   Liraglutide -Weight Management (SAXENDA) 18 MG/3ML SOPN  Inject 0.6 mg daily.  Increase by 0.6 mg weekly until goal of 3 mg daily 3 mL 5   loratadine (CLARITIN) 10 MG tablet Take 10 mg by mouth daily.     losartan (COZAAR) 50 MG tablet TAKE 1 TABLET(50 MG) BY MOUTH DAILY 90 tablet 1   metoprolol tartrate (LOPRESSOR) 50 MG tablet TAKE 1 TABLET(50 MG) BY MOUTH TWICE DAILY 180 tablet 1   pantoprazole (PROTONIX) 40 MG tablet TAKE 1 TABLET BY MOUTH EVERY DAY 90 tablet 1   potassium chloride (KLOR-CON) 10 MEQ tablet TAKE 4  TABLETS(40 MEQ) BY MOUTH DAILY 120 tablet 5   SYMBICORT 160-4.5 MCG/ACT inhaler INHALE 2 PUFFS INTO THE LUNGS TWICE DAILY (Patient taking differently: Takes as needed) 10.2 g 0   Tart Cherry 1200 MG CAPS Take by mouth.     TURMERIC PO Take by mouth daily.     valACYclovir (VALTREX) 500 MG tablet Take 500 mg by mouth 2 (two) times daily.     VITAMIN D PO Take 2,000 Units by mouth daily.     No current facility-administered medications on file prior to visit.    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   Anxiety    Breast cancer of upper-outer quadrant of right female breast ([place]) 10/23/2014   s/p r mastectomy, neoadj chemo completed 04/21/15; neg genetic testing   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   CHEST PAIN-UNSPECIFIED 05/19/2010   Qualifier: Diagnosis of  By: Aundra Dubin, MD, [place]     Chronic fatigue 06/08/2016   Colon polyps 05/2014   adenomatous, colo q 5y   Conjunctiva disorder 08/06/2018   CONSTIPATION, CHRONIC    Cough 04/25/2016   Depression 09/03/2016   Ear canal dryness 01/23/2018   FATIGUE 06/24/2008   Qualifier: Diagnosis of  By: Wynona Luna    GERD    Herpes zoster without complication 6/78/9381   Hot flashes    HYPERLIPIDEMIA    HYPERTENSION    Hypokalemia 12/10/2014   Lactose intolerance    LRTI (lower respiratory tract infection) 09/05/2017   LUQ cramping 04/21/2017   Migraine headache    MIGRAINES, HX OF 09/18/2007   Qualifier: Diagnosis of  By: Danelle Earthly CMA, Darlene     MS (multiple sclerosis) ([place]) 11/03/2015   Multiple sclerosis ([place])    Muscle strain 10/21/2016   OBESITY, TRUNCAL    OTHER SYMPTOMS INVOLVING DIGESTIVE SYSTEM OTHER 12/14/2009   Qualifier: Diagnosis of  By: Asa Lente MD, Valerie A    Otitis, externa, infective 05/24/2016    PERSONAL HX COLONIC POLYPS 04/21/2009   Qualifier: Diagnosis of  By: Shane Crutch, Amy S    SINUSITIS 01/14/2010   Qualifier: Diagnosis of  By: Asa Lente MD, Jannifer Rodney    Sleep apnea    Upper respiratory tract infection 09/23/2016   UTI 08/19/2010   Qualifier: Diagnosis of  By: Reatha Armour, Lucy     Wheezing 09/10/2017    Past Surgical History:  Procedure Laterality Date   BREAST BIOPSY Right    [place]    COLONOSCOPY     MASTECTOMY Right    PORT-A-CATH REMOVAL Left 05/13/2015   Procedure: REMOVAL PORT-A-CATH;  Surgeon: [name], MD;  Location: [facility];  Service: General;  Laterality: Left;   PORTACATH PLACEMENT Left 11/06/2014   Procedure: INSERTION PORT-A-CATH;  Surgeon:  [name], MD;  Location: [facility];  Service: General;  Laterality: Left;   SIMPLE MASTECTOMY WITH AXILLARY SENTINEL NODE BIOPSY Right 05/13/2015   Procedure: RIGHT TOTAL  MASTECTOMY WITH RIGHT  AXILLARY SENTINEL NODE BIOPSY;  Surgeon: [name], MD;  Location: [facility];  Service: General;  Laterality: Right;   TUBAL LIGATION      Social History   Socioeconomic History   Marital status: Single    Spouse name: Not on file   Number of children: 2   Years of education: 12   Highest education level: Not on file  Occupational History   Occupation: Solicitor: [employer]  Tobacco Use   Smoking status: Never Smoker   Smokeless tobacco: Never Used  Scientific laboratory technician Use: Never used  Substance and Sexual Activity   Alcohol use: No    Alcohol/week: 0.0 standard drinks   Drug use: No   Sexual activity: Not on file  Other Topics Concern   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    Patient is single and lives alone.    Patient is right handed.    Social Determinants of Health   Financial Resource Strain: Not on file  Food Insecurity:  Not on file  Transportation Needs: Not on file  Physical Activity: Not on file  Stress: Not on file  Social Connections: Not on file    Family History  Problem Relation Age of Onset   Coronary artery disease Mother    Heart disease Mother    Diabetes Mother    Hypertension Mother    Stroke Mother    Alcohol abuse Father    COPD Maternal Grandmother    Hypertension Other    Hyperlipidemia Other    Colon cancer Neg Hx    Rectal cancer Neg Hx    Stomach cancer Neg Hx    Esophageal cancer Neg Hx     Review of Systems  Constitutional: Negative for chills and fever.  Eyes: Negative for visual disturbance.  Respiratory: Positive for wheezing (occ). Negative for cough and shortness of breath.   Cardiovascular: Negative for chest pain, palpitations and leg swelling.  Gastrointestinal: Positive for constipation. Negative for abdominal pain, blood in stool, diarrhea and nausea.       Occ gerd  Genitourinary: Negative for dysuria and hematuria.  Musculoskeletal: Negative for arthralgias and back pain.  Skin: Negative for rash.  Neurological: Positive for headaches (occ). Negative for dizziness and light-headedness.  Psychiatric/Behavioral: Negative for dysphoric mood. The patient is nervous/anxious (occ, controlled - takes xanax prn - rare).        Objective:   Vitals:   10/08/20 1351  BP: 124/72  Pulse: 65  Temp: 98 F (36.7 C)  SpO2: 99%   Filed Weights   10/08/20 1351  Weight: 208 lb (94.3 kg)   Body mass index is 33.57 kg/m.  BP Readings from Last 3 Encounters:  10/08/20 124/72  09/20/20 122/79  07/28/20 140/82    Wt Readings from Last 3 Encounters:  10/08/20 208 lb (94.3 kg)  09/20/20 207 lb (93.9 kg)  07/28/20 218 lb (98.9 kg)     Physical Exam Constitutional: She appears well-developed and well-nourished. No distress.  HENT:  Head: Normocephalic and atraumatic.  Right Ear: External ear normal. Normal ear canal and TM Left Ear: External  ear normal.  Normal ear canal and TM Mouth/Throat: Oropharynx is clear and moist.  Eyes: Conjunctivae and EOM are normal.  Neck: Neck supple. No tracheal deviation present. No thyromegaly present.  No carotid bruit  Cardiovascular: Normal rate, regular rhythm and normal heart sounds.   No murmur heard.  No edema. Pulmonary/Chest: Effort normal and breath sounds normal. No respiratory distress. She has no wheezes. She has no rales.  Breast: deferred   Abdominal: Soft. She exhibits no distension. There is no tenderness.  Lymphadenopathy: She has no cervical adenopathy.  Skin: Skin is warm and dry. She is not diaphoretic.  Psychiatric: She has a normal mood and affect. Her behavior is normal.        Assessment & Plan:   Physical exam: Screening blood work    reviewed Immunizations  had covid booster, others up to date Colonoscopy  Up to date  Mammogram  scheduled Gyn  Up to date  Eye exams  Due - will schedule Exercise  walking Weight  Working on weight loss Substance abuse  none      See Problem List for Assessment and Plan of chronic medical problems.

## 2020-10-08 ENCOUNTER — Ambulatory Visit (INDEPENDENT_AMBULATORY_CARE_PROVIDER_SITE_OTHER): Payer: BC Managed Care – PPO | Admitting: Internal Medicine

## 2020-10-08 ENCOUNTER — Other Ambulatory Visit: Payer: Self-pay

## 2020-10-08 ENCOUNTER — Encounter: Payer: Self-pay | Admitting: Internal Medicine

## 2020-10-08 VITALS — BP 124/72 | HR 65 | Temp 98.0°F | Ht 66.0 in | Wt 208.0 lb

## 2020-10-08 DIAGNOSIS — I10 Essential (primary) hypertension: Secondary | ICD-10-CM

## 2020-10-08 DIAGNOSIS — T451X5A Adverse effect of antineoplastic and immunosuppressive drugs, initial encounter: Secondary | ICD-10-CM

## 2020-10-08 DIAGNOSIS — G35 Multiple sclerosis: Secondary | ICD-10-CM

## 2020-10-08 DIAGNOSIS — I484 Atypical atrial flutter: Secondary | ICD-10-CM

## 2020-10-08 DIAGNOSIS — G479 Sleep disorder, unspecified: Secondary | ICD-10-CM

## 2020-10-08 DIAGNOSIS — G620 Drug-induced polyneuropathy: Secondary | ICD-10-CM | POA: Diagnosis not present

## 2020-10-08 DIAGNOSIS — K219 Gastro-esophageal reflux disease without esophagitis: Secondary | ICD-10-CM

## 2020-10-08 DIAGNOSIS — G4733 Obstructive sleep apnea (adult) (pediatric): Secondary | ICD-10-CM | POA: Insufficient documentation

## 2020-10-08 DIAGNOSIS — Z0000 Encounter for general adult medical examination without abnormal findings: Secondary | ICD-10-CM | POA: Diagnosis not present

## 2020-10-08 MED ORDER — LINACLOTIDE 145 MCG PO CAPS: 145.0000 ug | ORAL_CAPSULE | Freq: Every day | ORAL | 5 refills | Status: DC

## 2020-10-08 MED ORDER — RIVAROXABAN 20 MG PO TABS: ORAL_TABLET | ORAL | 1 refills | Status: DC

## 2020-10-08 NOTE — Assessment & Plan Note (Signed)
Chronic BP well controlled Continue hctz 25 mg daily, losartan 50 mg daily, metoprolol 50 mg BID cmp

## 2020-10-08 NOTE — Assessment & Plan Note (Signed)
Chronic Continue gabapentin 900 mg TID

## 2020-10-08 NOTE — Assessment & Plan Note (Signed)
Chronic GERD controlled Continue protonix 40 mg daily  

## 2020-10-08 NOTE — Assessment & Plan Note (Signed)
Chronic, intermittent Taking benadryl on occ Ok to continue

## 2020-10-08 NOTE — Assessment & Plan Note (Signed)
Chronic On copaxone Following with Dr Brett Fairy

## 2020-10-08 NOTE — Assessment & Plan Note (Signed)
Chronic Dr Radford Pax - would like to see Dr Brett Fairy - referral ordered

## 2020-10-08 NOTE — Assessment & Plan Note (Signed)
Chronic Sees cardio No symptoms On xarelto 20 mg daily, metoprolol 50 mg BID Reviewed recent labs

## 2020-10-12 ENCOUNTER — Encounter: Payer: Self-pay | Admitting: Internal Medicine

## 2020-10-18 ENCOUNTER — Encounter (INDEPENDENT_AMBULATORY_CARE_PROVIDER_SITE_OTHER): Payer: Self-pay | Admitting: Family Medicine

## 2020-10-18 ENCOUNTER — Ambulatory Visit (INDEPENDENT_AMBULATORY_CARE_PROVIDER_SITE_OTHER): Payer: BC Managed Care – PPO | Admitting: Family Medicine

## 2020-10-18 ENCOUNTER — Other Ambulatory Visit: Payer: Self-pay

## 2020-10-18 VITALS — BP 129/75 | HR 86 | Temp 98.0°F | Ht 66.0 in | Wt 203.0 lb

## 2020-10-18 DIAGNOSIS — E669 Obesity, unspecified: Secondary | ICD-10-CM

## 2020-10-18 DIAGNOSIS — I10 Essential (primary) hypertension: Secondary | ICD-10-CM | POA: Diagnosis not present

## 2020-10-18 DIAGNOSIS — Z6832 Body mass index (BMI) 32.0-32.9, adult: Secondary | ICD-10-CM

## 2020-10-19 NOTE — Progress Notes (Signed)
Chief Complaint:   OBESITY [name] [name] is here to discuss her progress with her obesity treatment plan along with follow-up of her obesity related diagnoses. [name] [name] is on the Category 2 Plan and states she is following her eating plan approximately 75-80% of the time. [name] [name] states she is doing 0 minutes 0 times per week.  Today's visit was #: 3 Starting weight: 207 lbs Starting date: 09/20/2020 Today's weight: 203 lbs Today's date: 10/18/2020 Total lbs lost to date: 4 Total lbs lost since last in-office visit: 4  Interim History: [name] [name] has had a good few weeks. She has found the last few weeks to be slightly more difficult. She wants to start meal prepping to increase her compliance with the plan. She has no plans for the next few weeks. She thinks she will get all of the food in the fridge to take away some of the prep. She is feeling satisfied and she denies hunger, but she notes cravings for chocolate the other day.  Subjective:   1. Essential hypertension [name] [name]'s blood pressure is well controlled. She denies chest pain, chest pressure, or headache.  Assessment/Plan:   1. Essential hypertension [name] [name] will continue her current medications, and will continue working on healthy weight loss and exercise to improve blood pressure control. We will watch for signs of hypotension as she continues her lifestyle modifications.  2. Class 1 obesity with serious comorbidity and body mass index (BMI) of 32.0 to 32.9 in adult, unspecified obesity type [name] [name] is currently in the action stage of change. As such, her goal is to continue with weight loss efforts. She has agreed to the Category 2 Plan.   Exercise goals: No exercise has been prescribed at this time.  Behavioral modification strategies: increasing lean protein intake, meal planning and cooking strategies and keeping healthy foods in the home.  [name] [name] has agreed to follow-up with our clinic in 2 weeks. She was  informed of the importance of frequent follow-up visits to maximize her success with intensive lifestyle modifications for her multiple health conditions.   Objective:   Blood pressure 129/75, pulse 86, temperature 98 F (36.7 C), temperature source Oral, height 5\' 6"  (1.676 m), weight 203 lb (92.1 kg), last menstrual period 11/01/2012, SpO2 99 %. Body mass index is 32.77 kg/m.  General: Cooperative, alert, well developed, in no acute distress. HEENT: Conjunctivae and lids unremarkable. Cardiovascular: Regular rhythm.  Lungs: Normal work of breathing. Neurologic: No focal deficits.   Lab Results  Component Value Date   CREATININE 0.81 09/20/2020   BUN 9 09/20/2020   NA 136 09/20/2020   K 4.1 09/20/2020   CL 99 09/20/2020   CO2 20 09/20/2020   Lab Results  Component Value Date   ALT 21 09/20/2020   AST 26 09/20/2020   ALKPHOS 91 09/20/2020   BILITOT 0.5 09/20/2020   Lab Results  Component Value Date   HGBA1C 5.3 09/20/2020   HGBA1C 5.2 12/08/2019   HGBA1C 5.4 02/21/2017   HGBA1C 5.9 12/30/2007   Lab Results  Component Value Date   INSULIN 4.6 09/20/2020   INSULIN 18.1 02/21/2017   Lab Results  Component Value Date   TSH 1.650 09/20/2020   Lab Results  Component Value Date   CHOL 215 (H) 09/20/2020   HDL 51 09/20/2020   LDLCALC 133 (H) 09/20/2020   LDLDIRECT 120.0 12/08/2019   TRIG 175 (H) 09/20/2020   CHOLHDL 4 12/08/2019   Lab Results  Component Value Date   WBC 6.8  09/20/2020   HGB 13.8 09/20/2020   HCT 41.1 09/20/2020   MCV 88 09/20/2020   PLT 239 09/20/2020   Lab Results  Component Value Date   IRON 89 10/09/2017   FERRITIN 401.1 (H) 10/09/2017   Attestation Statements:   Reviewed by clinician on day of visit: allergies, medications, problem list, medical history, surgical history, family history, social history, and previous encounter notes.   I, Trixie Dredge, am acting as transcriptionist for Coralie Common, MD.  I have reviewed the  above documentation for accuracy and completeness, and I agree with the above. - Jinny Blossom, MD

## 2020-10-20 ENCOUNTER — Encounter: Payer: Self-pay | Admitting: Internal Medicine

## 2020-10-27 ENCOUNTER — Other Ambulatory Visit: Payer: Self-pay | Admitting: Internal Medicine

## 2020-11-02 ENCOUNTER — Encounter: Payer: Self-pay | Admitting: Internal Medicine

## 2020-11-02 ENCOUNTER — Ambulatory Visit (INDEPENDENT_AMBULATORY_CARE_PROVIDER_SITE_OTHER): Payer: BC Managed Care – PPO | Admitting: Family Medicine

## 2020-11-03 ENCOUNTER — Other Ambulatory Visit: Payer: Self-pay

## 2020-11-03 ENCOUNTER — Ambulatory Visit
Admission: RE | Admit: 2020-11-03 | Discharge: 2020-11-03 | Disposition: A | Discharge status: Home / Self Care | Payer: BC Managed Care – PPO | Source: Ambulatory Visit | Attending: Internal Medicine | Admitting: Internal Medicine

## 2020-11-03 ENCOUNTER — Encounter (INDEPENDENT_AMBULATORY_CARE_PROVIDER_SITE_OTHER): Payer: Self-pay

## 2020-11-03 DIAGNOSIS — Z1231 Encounter for screening mammogram for malignant neoplasm of breast: Secondary | ICD-10-CM

## 2020-11-12 ENCOUNTER — Other Ambulatory Visit: Payer: Self-pay | Admitting: Internal Medicine

## 2020-11-12 MED ORDER — BUDESONIDE-FORMOTEROL FUMARATE 160-4.5 MCG/ACT IN AERO: 2.0000 | INHALATION_SPRAY | Freq: Two times a day (BID) | RESPIRATORY_TRACT | 5 refills | Status: DC

## 2020-11-12 NOTE — Telephone Encounter (Signed)
Possible duplicate 

## 2020-11-15 NOTE — Progress Notes (Signed)
[name] [name] Phone: [phone] Subjective:   I Kandace Blitz am serving as a Education administrator for Dr. Hulan Saas.  This visit occurred during the SARS-CoV-2 public health emergency.  Safety protocols were in place, including screening questions prior to the visit, additional usage of staff PPE, and extensive cleaning of exam room while observing appropriate contact time as indicated for disinfecting solutions.   I'm seeing this patient by the request  of:  Binnie Rail, MD  CC: Right shoulder pain follow-up  NIO:[id number]   05/21/2020 Attempted injection more than the glenohumeral joint this time.  If continuing to have pain will consider the acromioclavicular joint at follow-up.  Patient is is a chronic problem.  Patient does have a history of breast cancer but x-rays have been only showing a glenohumeral and acromioclavicular arthritis noted with no bony mets.  We discussed advanced imaging which patient declined.  Patient will follow up again in 2 to 3 months   Update 11/16/2020 Ezmeralda Stefanick is a 60 y.o. female coming in with complaint of right shoulder pain. Patient states she is still in pain and it is similar to the issues she had before. Wants an injection if possible.  Last injection was 6 months ago.  Patient states it is starting to give her more discomfort and pain again.  Patient states that it is affecting daily activity.  Also know what the long-term goal is here.-   right shoulder injected 05/24/20  Past Medical History:  Diagnosis Date  . Acute non-recurrent maxillary sinusitis 01/08/2017  . Allergic rhinitis due to other allergen   . Allergy   . Angioedema    ACEI  . 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 Anxiety   . Breast cancer of upper-outer quadrant of right female breast ([place]) 10/23/2014   s/p r mastectomy, neoadj chemo completed 04/21/15; neg genetic testing  . Chemotherapy induced  thrombocytopenia 02/25/2015  . CHEST PAIN-UNSPECIFIED 05/19/2010   Qualifier: Diagnosis of  By: Aundra Dubin, MD, [place]    . Chronic fatigue 06/08/2016  . Colon polyps 05/2014   adenomatous, colo q 5y  . Conjunctiva disorder 08/06/2018  . CONSTIPATION, CHRONIC   . Cough 04/25/2016  . Depression 09/03/2016  . Ear canal dryness 01/23/2018  . FATIGUE 06/24/2008   Qualifier: Diagnosis of  By: Wynona Luna   . GERD   . Herpes zoster without complication 1/82/9937  . Hot flashes   . HYPERLIPIDEMIA   . HYPERTENSION   . Hypokalemia 12/10/2014  . Lactose intolerance   . LRTI (lower respiratory tract infection) 09/05/2017  . LUQ cramping 04/21/2017  . Migraine headache   . MIGRAINES, HX OF 09/18/2007   Qualifier: Diagnosis of  By: Danelle Earthly CMA, Darlene    . MS (multiple sclerosis) ([place]) 11/03/2015  . Multiple sclerosis ([place])   . Muscle strain 10/21/2016  . OBESITY, TRUNCAL   . OTHER SYMPTOMS INVOLVING DIGESTIVE SYSTEM OTHER 12/14/2009   Qualifier: Diagnosis of  By: Asa Lente MD, Jannifer Rodney   . Otitis, externa, infective 05/24/2016  . PERSONAL HX COLONIC POLYPS 04/21/2009   Qualifier: Diagnosis of  By: Trellis Paganini PA-c, Amy S   . SINUSITIS 01/14/2010   Qualifier: Diagnosis of  By: Asa Lente MD, Jannifer Rodney Sleep apnea   . Upper respiratory tract infection 09/23/2016  . UTI 08/19/2010   Qualifier: Diagnosis of  By: Marca Ancona RMA, Lucy    . Wheezing 09/10/2017   Past Surgical  History:  Procedure Laterality Date  . BREAST BIOPSY Right    [place]   . COLONOSCOPY    . MASTECTOMY Right   . PORT-A-CATH REMOVAL Left 05/13/2015   Procedure: REMOVAL PORT-A-CATH;  Surgeon: [name], MD;  Location: [facility];  Service: General;  Laterality: Left;  . PORTACATH PLACEMENT Left 11/06/2014   Procedure: INSERTION PORT-A-CATH;  Surgeon: [name], MD;  Location: [facility];  Service: General;  Laterality: Left;  . SIMPLE MASTECTOMY WITH AXILLARY SENTINEL NODE BIOPSY Right 05/13/2015   Procedure: RIGHT TOTAL   MASTECTOMY WITH RIGHT  AXILLARY SENTINEL NODE BIOPSY;  Surgeon: [name], MD;  Location: [facility];  Service: General;  Laterality: Right;  . TUBAL LIGATION     Social History   Socioeconomic History  . Marital status: Single    Spouse name: Not on file  . Number of children: 2  . Years of education: 45  . Highest education level: Not on file  Occupational History  . Occupation: Solicitor: [employer]  Tobacco Use  . Smoking status: Never Smoker  . Smokeless tobacco: Never Used  Vaping Use  . Vaping Use: Never used  Substance and Sexual Activity  . Alcohol use: No    Alcohol/week: 0.0 standard drinks  . Drug use: No  . Sexual activity: Not on file  Other Topics Concern  . 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    Patient is single and lives alone.    Patient is right handed.    Social Determinants of Health   Financial Resource Strain: Not on file  Food Insecurity: Not on file  Transportation Needs: Not on file  Physical Activity: Not on file  Stress: Not on file  Social Connections: Not on file   Allergies  Allergen Reactions  . Ace Inhibitors Anaphylaxis     Angioedema (tongue swelling)  . Clarithromycin Anaphylaxis    Throat swells  . Contrave [Naltrexone-Bupropion Hcl Er]     Chest and sob  . Levaquin [Levofloxacin] Other (See Comments)    Tendon pain - shoulder and calf   Family History  Problem Relation Age of Onset  . Coronary artery disease Mother   . Heart disease Mother   . Diabetes Mother   . Hypertension Mother   . Stroke Mother   . Alcohol abuse Father   . COPD Maternal Grandmother   . Hypertension Other   . Hyperlipidemia Other   . Colon cancer Neg Hx   . Rectal cancer Neg Hx   . Stomach cancer Neg Hx   . Esophageal cancer Neg Hx      Current Outpatient Medications (Cardiovascular):  .   hydrochlorothiazide (HYDRODIURIL) 25 MG tablet, Take 25 mg by mouth daily. [name]  losartan (COZAAR) 50 MG tablet, TAKE 1 TABLET(50 MG) BY MOUTH DAILY .  metoprolol tartrate (LOPRESSOR) 50 MG tablet, TAKE 1 TABLET(50 MG) BY MOUTH TWICE DAILY  Current Outpatient Medications (Respiratory):  .  azelastine (ASTELIN) 0.1 % nasal spray, azelastine 137 mcg (0.1 %) nasal spray aerosol .  budesonide-formoterol (SYMBICORT) 160-4.5 MCG/ACT inhaler, Inhale 2 puffs into the lungs 2 (two) times daily. .  fluticasone (FLONASE) 50 MCG/ACT nasal spray, SHAKE LIQUID AND USE 2 SPRAYS IN EACH NOSTRIL DAILY .  loratadine (CLARITIN) 10 MG tablet, Take 10 mg by mouth daily.   Current Outpatient Medications (Hematological):  [name]  ferrous gluconate (FERGON) 324 MG tablet, Take 1 tablet (324 mg total) by mouth 2 (two) times daily with a meal. (Patient taking differently: Take 324 mg by mouth 3 (three) times a week.) .  rivaroxaban (XARELTO) 20 MG TABS tablet, TAKE 1 TABLET(20 MG) BY MOUTH DAILY WITH SUPPER  Current Outpatient Medications (Other):  .  b complex vitamins tablet, Take 1 tablet by mouth daily. .  BD PEN NEEDLE MICRO U/F 32G X 6 MM MISC, Inject into the skin. [name]  COPAXONE 40 MG/ML SOSY, INJECT ONE SYRINGE SUBCUTANEOUSLY THREE TIMES PER WEEK AT LEAST 48 HOURS APART. ALLOW SYRINGE TO WARM TO ROOM TEMPERATURE FOR 20 MINUTES. REFRIGERATE. [name]  gabapentin (NEURONTIN) 100 MG capsule, Take 3 capsules (300 mg total) by mouth 3 (three) times daily. .  Insulin Pen Needle (UNIFINE PENTIPS) 31G X 6 MM MISC, Use daily as directly for saxenda injection .  KLOR-CON M10 10 MEQ tablet, TAKE 4 TABLETS BY MOUTH EVERY DAY .  linaclotide (LINZESS) 145 MCG CAPS capsule, Take 1 capsule (145 mcg total) by mouth daily before breakfast. .  Liraglutide -Weight Management (SAXENDA) 18 MG/3ML SOPN, Inject 0.6 mg daily.  Increase by 0.6 mg weekly until goal of 3 mg daily .  pantoprazole (PROTONIX) 40 MG tablet, TAKE 1 TABLET BY MOUTH EVERY  DAY .  potassium chloride (KLOR-CON) 10 MEQ tablet, TAKE 4 TABLETS(40 MEQ) BY MOUTH DAILY .  Tart Cherry 1200 MG CAPS, Take by mouth. .  TURMERIC PO, Take by mouth daily. .  valACYclovir (VALTREX) 500 MG tablet, Take 500 mg by mouth 2 (two) times daily. [name]  VITAMIN D PO, Take 2,000 Units by mouth daily.   Reviewed prior external information including notes and imaging from  primary care provider As well as notes that were available from care everywhere and other healthcare systems.  Past medical history, social, surgical and family history all reviewed in electronic medical record.  No pertanent information unless stated regarding to the chief complaint.   Review of Systems:  No headache, visual changes, nausea, vomiting, diarrhea, constipation, dizziness, abdominal pain, skin rash, fevers, chills, night sweats, weight loss, swollen lymph nodes,  joint swelling, chest pain, shortness of breath, mood changes. POSITIVE muscle aches, body aches  Objective  Blood pressure 138/80, pulse 71, height 5\' 6"  (1.676 m), weight 208 lb (94.3 kg), last menstrual period 11/01/2012, SpO2 99 %.   General: No apparent distress alert and oriented x3 mood and affect normal, dressed appropriately.  HEENT: Pupils equal, extraocular movements intact  Respiratory: Patient's speak in full sentences and does not appear short of breath  Cardiovascular: No lower extremity edema, non tender, no erythema  Gait normal with good balance and coordination.  MSK:  Right shoulder exam shows the patient does have weakness noted of the rotator cuff.  4 out of 5 strength noted.  Patient does have a positive crepitus with range of motion.  Patient has near passive range of motion but does have some limited active range of motion in all planes.  Mild high riding humeral head but no true atrophy noted.  Procedure: Real-time Ultrasound Guided Injection of right glenohumeral joint Device: GE Logiq Q7  Ultrasound guided injection  is preferred based studies that show increased duration, increased effect, greater accuracy, decreased procedural pain, increased response rate with ultrasound guided versus blind injection.  Verbal informed consent obtained.  Time-out conducted.  Noted no overlying erythema, induration, or other signs of local infection.  Skin prepped in a sterile fashion.  Local anesthesia: Topical Ethyl chloride.  With sterile technique and under real time ultrasound guidance:  Joint visualized.  23g 1  inch needle inserted posterior approach. Pictures taken for needle placement. Patient did have injection of  2 cc of 0.5% Marcaine, and 1.0 cc of Kenalog 40 mg/dL. Completed without difficulty  Pain immediately improved suggesting accurate placement of the medication.  Advised to call if fevers/chills, erythema, induration, drainage, or persistent bleeding.  Impression: Technically successful ultrasound guided injection.  Procedure: Real-time Ultrasound Guided Injection of right acromioclavicular joint Device: GE Logiq Q7 Ultrasound guided injection is preferred based studies that show increased duration, increased effect, greater accuracy, decreased procedural pain, increased response rate, and decreased cost with ultrasound guided versus blind injection.  Verbal informed consent obtained.  Time-out conducted.  Noted no overlying erythema, induration, or other signs of local infection.  Skin prepped in a sterile fashion.  Local anesthesia: Topical Ethyl chloride.  With sterile technique and under real time ultrasound guidance: With a 25-gauge 1 inch needle injected with 0.5 cc of 0.5% Marcaine and 0.5 cc of Kenalog 40 mg/mL from superior approach Completed without difficulty  Pain immediately improved suggesting accurate placement of the medication.  Advised to call if fevers/chills, erythema, induration, drainage, or persistent bleeding.  Impression: Technically successful ultrasound guided  injection.    Impression and Recommendations:     The above documentation has been reviewed and is accurate and complete Lyndal Pulley, DO

## 2020-11-16 ENCOUNTER — Ambulatory Visit: Payer: BC Managed Care – PPO | Admitting: Family Medicine

## 2020-11-16 ENCOUNTER — Ambulatory Visit: Payer: Self-pay

## 2020-11-16 ENCOUNTER — Ambulatory Visit (INDEPENDENT_AMBULATORY_CARE_PROVIDER_SITE_OTHER): Payer: BC Managed Care – PPO

## 2020-11-16 ENCOUNTER — Other Ambulatory Visit: Payer: Self-pay

## 2020-11-16 ENCOUNTER — Encounter: Payer: Self-pay | Admitting: Family Medicine

## 2020-11-16 VITALS — BP 138/80 | HR 71 | Ht 66.0 in | Wt 208.0 lb

## 2020-11-16 DIAGNOSIS — G8929 Other chronic pain: Secondary | ICD-10-CM

## 2020-11-16 DIAGNOSIS — M25511 Pain in right shoulder: Secondary | ICD-10-CM | POA: Diagnosis not present

## 2020-11-16 DIAGNOSIS — M12811 Other specific arthropathies, not elsewhere classified, right shoulder: Secondary | ICD-10-CM

## 2020-11-16 DIAGNOSIS — M19011 Primary osteoarthritis, right shoulder: Secondary | ICD-10-CM | POA: Diagnosis not present

## 2020-11-16 NOTE — Assessment & Plan Note (Signed)
Repeat injection given again today. Tolerated the procedure well, discussed icing regimen and home exercises. We will repeat x-rays to further evaluate. Encourage patient that if no improvement in 3 to 4 weeks I would like an MRI.

## 2020-11-16 NOTE — Patient Instructions (Addendum)
Good to see you Xray today If pain continues we should consider MRI and I want you to message me in 3 weeks to tell me how you are doing See me again in 2 to 3 months no matter what

## 2020-11-16 NOTE — Assessment & Plan Note (Signed)
Injection given today.  Tolerated the procedure well.  History of multiple sclerosis as well as the right-sided breast cancer. [name] [name] that this could be contributing some weakness.  We discussed again about advanced imaging but patient wanted to try the injections again.  Worsening pain I would highly recommend the MRI.  Patient is in agreement with the plan.  New x-rays are ordered today to further evaluate follow-up with me again in 2 months otherwise.

## 2020-11-21 ENCOUNTER — Other Ambulatory Visit: Payer: Self-pay | Admitting: Internal Medicine

## 2020-11-24 ENCOUNTER — Ambulatory Visit: Payer: BC Managed Care – PPO | Admitting: Family Medicine

## 2020-11-24 NOTE — Progress Notes (Signed)
Opened in error - please disregard

## 2020-11-28 ENCOUNTER — Other Ambulatory Visit: Payer: Self-pay | Admitting: Internal Medicine

## 2020-11-28 DIAGNOSIS — G5711 Meralgia paresthetica, right lower limb: Secondary | ICD-10-CM

## 2020-12-01 ENCOUNTER — Encounter: Payer: Self-pay | Admitting: Internal Medicine

## 2020-12-07 NOTE — Progress Notes (Signed)
Subjective:    Patient ID: [name] [name], female    DOB: August 22, 1961, 60 y.o.   MRN: [id number]  HPI The patient is here for an acute visit.  Pain under L breast, lateral left chest-- pain in lateral and lower left breast.  She had an episode of pain that lasted a while recently and is unsure how long it lasted.  She has not had any recurrent episodes.  She was not doing any exertion at the time that it presented.  She denies any associated shortness of breath, palpitations or lightheadedness.  She took a couple of Tylenol and that helped.  She was concerned about her heart.  She had a recent mammogram that was normal of her left breast.  She also had a breast exam 2 days ago by her gynecologist and that was normal.    In the past couple of days she also experienced some left hand pain and left shoulder pain.  The left hand pain started yesterday.  She currently does not have it.  She also had a twinge of pain in her left shoulder, which is not currently present.  She denies these pains coming with the chest pain.  These pains are all different and she wanted to have them evaluated.   Not currently walking/exercising regularly.  She also thinks she has a sinus infection.  She has sinus pressure, nasal congestion with discoloration.  This is more than her typical seasonal allergies.  She does have a history of frequent sinus infections.   Medications and allergies reviewed with patient and updated if appropriate.  Patient Active Problem List   Diagnosis Date Noted  . Rotator cuff arthropathy of right shoulder 11/16/2020  . OSA (obstructive sleep apnea) 10/08/2020  . Disorder of SI (sacroiliac) joint 07/28/2020  . Right-sided chest wall pain 12/22/2019  . AC (acromioclavicular) arthritis 10/09/2019  . Acute pain of right shoulder 09/04/2019  . Atrial flutter ([place]) 02/28/2019  . RLS (restless legs syndrome) 11/21/2018  . Sleep difficulties 08/06/2018  . Trigger finger 01/23/2018   . Meralgia paraesthetica, right 11/05/2017  . Iron deficiency 10/09/2017  . Vitamin D deficiency 06/15/2017  . Lumbar radiculopathy 04/21/2017  . Trigger index finger of left hand 04/21/2017  . Morbid obesity ([place]) 04/21/2017  . Left hip pain 10/31/2016  . Pes planus 10/31/2016  . Posterior tibialis muscle dysfunction 10/31/2016  . Breast pain, right 08/24/2016  . Palpitation 04/04/2016  . 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 Malignant neoplasm of upper-outer quadrant of right breast in female, 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[place]) 11/03/2015  . S/P mastectomy 05/13/2015  . Chemotherapy-induced neuropathy ([place]) 04/21/2015  . Genetic testing 12/11/2014  . Breast cancer of upper-outer quadrant of right female breast ([place]) 10/23/2014  . Allergic rhinitis 11/29/2009  . 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 CONSTIPATION, CHRONIC 08/17/2008  . METABOLIC SYNDROME X 54/62/7035  . GERD 01/07/2008  . Essential hypertension 09/20/2007  . Multiple sclerosis ([place]) 09/18/2007    Current Outpatient Medications on File Prior to Visit  Medication Sig Dispense Refill  . azelastine (ASTELIN) 0.1 % nasal spray azelastine 137 mcg (0.1 %) nasal spray aerosol 30 mL 3  . b complex vitamins tablet Take 1 tablet by mouth daily.    . BD PEN NEEDLE MICRO U/F 32G X 6 MM MISC Inject into the skin.    . budesonide-formoterol (SYMBICORT) 160-4.5 MCG/ACT inhaler Inhale 2 puffs into the lungs 2 (two) times daily. 10.2 g 5  . COPAXONE 40 MG/ML SOSY INJECT ONE SYRINGE  SUBCUTANEOUSLY THREE TIMES PER WEEK AT LEAST 48 HOURS APART. ALLOW SYRINGE TO WARM TO ROOM TEMPERATURE FOR 20 MINUTES. REFRIGERATE. 36 mL 2  . ferrous gluconate (FERGON) 324 MG tablet Take 1 tablet (324 mg total) by mouth 2 (two) times daily with a meal. (Patient taking differently: Take 324 mg by mouth 3 (three) times a week.) 180 tablet 3  . fluticasone (FLONASE) 50 MCG/ACT nasal spray SHAKE LIQUID AND USE 2 SPRAYS IN EACH NOSTRIL DAILY 16 g 6  . gabapentin (NEURONTIN) 100 MG  capsule TAKE 3 CAPSULES BY MOUTH THREE TIMES DAILY 810 capsule 1  . hydrochlorothiazide (HYDRODIURIL) 25 MG tablet Take 25 mg by mouth daily.    . Insulin Pen Needle (UNIFINE PENTIPS) 31G X 6 MM MISC Use daily as directly for saxenda injection 30 each 5  . KLOR-CON M10 10 MEQ tablet TAKE 4 TABLETS BY MOUTH EVERY DAY 120 tablet 1  . linaclotide (LINZESS) 145 MCG CAPS capsule Take 1 capsule (145 mcg total) by mouth daily before breakfast. 30 capsule 5  . Liraglutide -Weight Management (SAXENDA) 18 MG/3ML SOPN Inject 0.6 mg daily.  Increase by 0.6 mg weekly until goal of 3 mg daily 3 mL 5  . loratadine (CLARITIN) 10 MG tablet Take 10 mg by mouth daily.    [name] losartan (COZAAR) 50 MG tablet TAKE 1 TABLET(50 MG) BY MOUTH DAILY 90 tablet 1  . metoprolol tartrate (LOPRESSOR) 50 MG tablet TAKE 1 TABLET(50 MG) BY MOUTH TWICE DAILY 180 tablet 1  . pantoprazole (PROTONIX) 40 MG tablet TAKE 1 TABLET BY MOUTH EVERY DAY 90 tablet 1  . potassium chloride (KLOR-CON) 10 MEQ tablet TAKE 4 TABLETS(40 MEQ) BY MOUTH DAILY 120 tablet 5  . rivaroxaban (XARELTO) 20 MG TABS tablet TAKE 1 TABLET(20 MG) BY MOUTH DAILY WITH SUPPER 90 tablet 1  . Tart Cherry 1200 MG CAPS Take by mouth.    . TURMERIC PO Take by mouth daily.    . valACYclovir (VALTREX) 500 MG tablet Take 500 mg by mouth 2 (two) times daily.    [name] VITAMIN D PO Take 2,000 Units by mouth daily.    [name] ketoconazole (NIZORAL) 2 % shampoo Apply topically.     No current facility-administered medications on file prior to visit.    Past Medical History:  Diagnosis Date  . Acute non-recurrent maxillary sinusitis 01/08/2017  . Allergic rhinitis due to other allergen   . Allergy   . Angioedema    ACEI  . 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 Anxiety   . Breast cancer of upper-outer quadrant of right female breast ([place]) 10/23/2014   s/p r mastectomy, neoadj chemo completed 04/21/15; neg genetic testing  . 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   . CHEST PAIN-UNSPECIFIED 05/19/2010   Qualifier: Diagnosis of  By: Aundra Dubin, MD, [place]    . Chronic fatigue 06/08/2016  . Colon polyps 05/2014   adenomatous, colo q 5y  . Conjunctiva disorder 08/06/2018  . CONSTIPATION, CHRONIC   . Cough 04/25/2016  . Depression 09/03/2016  . Ear canal dryness 01/23/2018  . FATIGUE 06/24/2008   Qualifier: Diagnosis of  By: Wynona Luna   . GERD   . Herpes zoster without complication 3/76/2831  . Hot flashes   . HYPERLIPIDEMIA   . HYPERTENSION   . Hypokalemia 12/10/2014  . Lactose intolerance   . LRTI (lower respiratory tract infection) 09/05/2017  . LUQ cramping 04/21/2017  . Migraine headache   . MIGRAINES, HX OF 09/18/2007   Qualifier:  Diagnosis of  By: Danelle Earthly CMA, Darlene    . MS (multiple sclerosis) ([place]) 11/03/2015  . Multiple sclerosis ([place])   . Muscle strain 10/21/2016  . OBESITY, TRUNCAL   . OTHER SYMPTOMS INVOLVING DIGESTIVE SYSTEM OTHER 12/14/2009   Qualifier: Diagnosis of  By: Asa Lente MD, Jannifer Rodney   . Otitis, externa, infective 05/24/2016  . PERSONAL HX COLONIC POLYPS 04/21/2009   Qualifier: Diagnosis of  By: Trellis Paganini PA-c, Amy S   . SINUSITIS 01/14/2010   Qualifier: Diagnosis of  By: Asa Lente MD, Jannifer Rodney Sleep apnea   . Upper respiratory tract infection 09/23/2016  . UTI 08/19/2010   Qualifier: Diagnosis of  By: Marca Ancona RMA, [place]    . Wheezing 09/10/2017    Past Surgical History:  Procedure Laterality Date  . BREAST BIOPSY Right    [place]   . COLONOSCOPY    . MASTECTOMY Right   . PORT-A-CATH REMOVAL Left 05/13/2015   Procedure: REMOVAL PORT-A-CATH;  Surgeon: [name], MD;  Location: [facility];  Service: General;  Laterality: Left;  . PORTACATH PLACEMENT Left 11/06/2014   Procedure: INSERTION PORT-A-CATH;  Surgeon: [name], MD;  Location: [facility];  Service: General;  Laterality: Left;  . SIMPLE MASTECTOMY WITH AXILLARY SENTINEL NODE BIOPSY Right 05/13/2015   Procedure: RIGHT TOTAL  MASTECTOMY WITH RIGHT   AXILLARY SENTINEL NODE BIOPSY;  Surgeon: [name], MD;  Location: [facility];  Service: General;  Laterality: Right;  . TUBAL LIGATION      Social History   Socioeconomic History  . Marital status: Single    Spouse name: Not on file  . Number of children: 2  . Years of education: 61  . Highest education level: Not on file  Occupational History  . Occupation: Solicitor: [employer]  Tobacco Use  . Smoking status: Never Smoker  . Smokeless tobacco: Never Used  Vaping Use  . Vaping Use: Never used  Substance and Sexual Activity  . Alcohol use: No    Alcohol/week: 0.0 standard drinks  . Drug use: No  . Sexual activity: Not on file  Other Topics Concern  . 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    Patient is single and lives alone.    Patient is right handed.    Social Determinants of Health   Financial Resource Strain: Not on file  Food Insecurity: Not on file  Transportation Needs: Not on file  Physical Activity: Not on file  Stress: Not on file  Social Connections: Not on file    Family History  Problem Relation Age of Onset  . Coronary artery disease Mother   . Heart disease Mother   . Diabetes Mother   . Hypertension Mother   . Stroke Mother   . Alcohol abuse Father   . COPD Maternal Grandmother   . Hypertension Other   . Hyperlipidemia Other   . Colon cancer Neg Hx   . Rectal cancer Neg Hx   . Stomach cancer Neg Hx   . Esophageal cancer Neg Hx     Review of Systems  Constitutional: Negative for fever.  HENT: Positive for congestion and sinus pressure. Negative for ear pain and sore throat.   Respiratory: Negative for cough, shortness of breath and wheezing.   Cardiovascular: Positive for chest pain and palpitations (maybe). Negative for leg swelling.  Gastrointestinal: Negative  for nausea.       No gerd  Neurological:  Negative for light-headedness and headaches.       Objective:   Vitals:   12/08/20 0912  BP: 140/84  Pulse: 94  Temp: 98.4 F (36.9 C)  SpO2: 99%   BP Readings from Last 3 Encounters:  12/08/20 140/84  11/16/20 138/80  10/18/20 129/75   Wt Readings from Last 3 Encounters:  12/08/20 208 lb (94.3 kg)  11/16/20 208 lb (94.3 kg)  10/18/20 203 lb (92.1 kg)   Body mass index is 33.57 kg/m.   Physical Exam Constitutional:      General: She is not in acute distress.    Appearance: Normal appearance. She is not ill-appearing.  HENT:     Head: Normocephalic and atraumatic.     Nose: Nose normal.     Mouth/Throat:     Mouth: Mucous membranes are moist.     Pharynx: No posterior oropharyngeal erythema.  Neck:     Vascular: No carotid bruit.  Cardiovascular:     Rate and Rhythm: Normal rate and regular rhythm.     Heart sounds: No murmur heard.   Pulmonary:     Effort: Pulmonary effort is normal. No respiratory distress.     Breath sounds: Normal breath sounds. No wheezing or rales.  Musculoskeletal:     Cervical back: Neck supple. No tenderness.     Right lower leg: No edema.     Left lower leg: No edema.     Comments: No chest wall tenderness or breast tenderness with palpation.  No tenderness with palpation of left shoulder.  Full range of motion of left shoulder without discomfort.  No pain with palpation of left hand.  Lymphadenopathy:     Cervical: No cervical adenopathy.  Skin:    General: Skin is warm and dry.  Neurological:     Mental Status: She is alert.     Sensory: No sensory deficit.     Motor: No weakness.            Assessment & Plan:    See Problem List for Assessment and Plan of chronic medical problems.    This visit occurred during the SARS-CoV-2 public health emergency.  Safety protocols were in place, including screening questions prior to the visit, additional usage of staff PPE, and extensive cleaning of exam room while observing  appropriate contact time as indicated for disinfecting solutions.

## 2020-12-08 ENCOUNTER — Ambulatory Visit: Payer: BC Managed Care – PPO | Admitting: Internal Medicine

## 2020-12-08 ENCOUNTER — Encounter: Payer: Self-pay | Admitting: Internal Medicine

## 2020-12-08 ENCOUNTER — Other Ambulatory Visit: Payer: Self-pay

## 2020-12-08 VITALS — BP 140/84 | HR 94 | Temp 98.4°F | Ht 66.0 in | Wt 208.0 lb

## 2020-12-08 DIAGNOSIS — J0100 Acute maxillary sinusitis, unspecified: Secondary | ICD-10-CM

## 2020-12-08 DIAGNOSIS — R0789 Other chest pain: Secondary | ICD-10-CM | POA: Diagnosis not present

## 2020-12-08 MED ORDER — ALPRAZOLAM 0.5 MG PO TABS: 0.5000 mg | ORAL_TABLET | Freq: Two times a day (BID) | ORAL | 0 refills | Status: DC | PRN

## 2020-12-08 MED ORDER — AZITHROMYCIN 250 MG PO TABS: ORAL_TABLET | ORAL | 0 refills | Status: DC

## 2020-12-08 NOTE — Patient Instructions (Addendum)
An EKG was done today.  See Dr Radford Pax as scheduled.     An antibiotic was sent to your pharmacy.     Please call if there is no improvement in your symptoms.

## 2020-12-08 NOTE — Assessment & Plan Note (Signed)
Acute Episode of atypical chest pain that occurred at rest-has not recurred and there is no associated symptoms Unlikely to be cardiac in nature EKG today shows normal sinus rhythm at 93 bpm, nonspecific T wave abnormality.  No significant change from previous EKGs Reassured her that I do not think this is cardiac in nature.  She does have an appointment with her cardiologist, Dr. Radford Pax and she can evaluate further given her risk factors for CAD

## 2020-12-08 NOTE — Addendum Note (Signed)
Addended by: Marcina Millard on: 12/08/2020 12:43 PM   Modules accepted: Orders

## 2020-12-08 NOTE — Assessment & Plan Note (Signed)
Acute Symptoms consistent with sinus infection, especially given her history of sinus infections Start Z-Pak-this has worked well for her in the past Continue allergy medications, over-the-counter cold medications Call if no improvement

## 2020-12-12 ENCOUNTER — Other Ambulatory Visit: Payer: Self-pay | Admitting: Internal Medicine

## 2020-12-13 ENCOUNTER — Other Ambulatory Visit: Payer: Self-pay

## 2020-12-13 ENCOUNTER — Encounter (INDEPENDENT_AMBULATORY_CARE_PROVIDER_SITE_OTHER): Payer: Self-pay | Admitting: Family Medicine

## 2020-12-13 ENCOUNTER — Ambulatory Visit (INDEPENDENT_AMBULATORY_CARE_PROVIDER_SITE_OTHER): Payer: BC Managed Care – PPO | Admitting: Family Medicine

## 2020-12-13 VITALS — BP 137/83 | HR 87 | Temp 98.4°F | Ht 66.0 in | Wt 209.0 lb

## 2020-12-13 DIAGNOSIS — E669 Obesity, unspecified: Secondary | ICD-10-CM

## 2020-12-13 DIAGNOSIS — I10 Essential (primary) hypertension: Secondary | ICD-10-CM | POA: Diagnosis not present

## 2020-12-13 DIAGNOSIS — E7849 Other hyperlipidemia: Secondary | ICD-10-CM

## 2020-12-13 DIAGNOSIS — Z6833 Body mass index (BMI) 33.0-33.9, adult: Secondary | ICD-10-CM | POA: Diagnosis not present

## 2020-12-20 NOTE — Progress Notes (Signed)
Chief Complaint:   OBESITY [name] [name] is here to discuss her progress with her obesity treatment plan along with follow-up of her obesity related diagnoses. [name] [name] is on the Category 2 Plan and states she is following her eating plan approximately 22% of the time. [name] [name] states she is walking 10-15 minutes 5 times per week.  Today's visit was #: 4 Starting weight: 207 lbs Starting date: 09/20/2020 Today's weight: 209 lbs Today's date: 12/13/2020 Total lbs lost to date: 0 Total lbs lost since last in-office visit: 0  Interim History: [name] [name] stopped Saxenda due to hair thinning. She has struggled to adhere to plan. Her coworkers were bringing food into work and sh struggled with saying no. Indulgences were sweets- candy, donuts, etc.  Subjective:   1. Other hyperlipidemia [name] [name] last LDL 133, HDL 51, triglycerides 175. She is not on statin therapy.  Lab Results  Component Value Date   ALT 21 09/20/2020   AST 26 09/20/2020   ALKPHOS 91 09/20/2020   BILITOT 0.5 09/20/2020   Lab Results  Component Value Date   CHOL 215 (H) 09/20/2020   HDL 51 09/20/2020   LDLCALC 133 (H) 09/20/2020   LDLDIRECT 120.0 12/08/2019   TRIG 175 (H) 09/20/2020   CHOLHDL 4 12/08/2019    2. Essential hypertension [name] [name] BP is well controlled today.  Pt denies chest pain, chest pressure and headache.  BP Readings from Last 3 Encounters:  12/13/20 137/83  12/08/20 140/84  11/16/20 138/80    Assessment/Plan:   1. Other hyperlipidemia Cardiovascular risk and specific lipid/LDL goals reviewed.  We discussed several lifestyle modifications today and [name] [name] will continue to work on diet, exercise and weight loss efforts. Orders and follow up as documented in patient record. Repeat labs in May/June.  Counseling Intensive lifestyle modifications are the first line treatment for this issue. . Dietary changes: Increase soluble fiber. Decrease simple carbohydrates. . Exercise  changes: Moderate to vigorous-intensity aerobic activity 150 minutes per week if tolerated. . Lipid-lowering medications: see documented in medical record.  2. Essential hypertension [name] [name] is working on healthy weight loss and exercise to improve blood pressure control. We will watch for signs of hypotension as she continues her lifestyle modifications. Continue current meds.  3. Class 1 obesity with serious comorbidity and body mass index (BMI) of 33.0 to 33.9 in adult, unspecified obesity type [name] [name] is currently in the action stage of change. As such, her goal is to continue with weight loss efforts. She has agreed to the Category 2 Plan.   Discussed ways to implement some control in eating.  Will switch out bread with tortillas.  Exercise goals: As is  Behavioral modification strategies: increasing lean protein intake, meal planning and cooking strategies and keeping healthy foods in the home.  [name] [name] has agreed to follow-up with our clinic in 2-3 weeks. She was informed of the importance of frequent follow-up visits to maximize her success with intensive lifestyle modifications for her multiple health conditions.   Objective:   Blood pressure 137/83, pulse 87, temperature 98.4 F (36.9 C), temperature source Oral, height 5\' 6"  (1.676 m), weight 209 lb (94.8 kg), last menstrual period 11/01/2012, SpO2 99 %. Body mass index is 33.73 kg/m.  General: Cooperative, alert, well developed, in no acute distress. HEENT: Conjunctivae and lids unremarkable. Cardiovascular: Regular rhythm.  Lungs: Normal work of breathing. Neurologic: No focal deficits.   Lab Results  Component Value Date   CREATININE 0.81 09/20/2020   BUN 9 09/20/2020   NA  136 09/20/2020   K 4.1 09/20/2020   CL 99 09/20/2020   CO2 20 09/20/2020   Lab Results  Component Value Date   ALT 21 09/20/2020   AST 26 09/20/2020   ALKPHOS 91 09/20/2020   BILITOT 0.5 09/20/2020   Lab Results  Component Value  Date   HGBA1C 5.3 09/20/2020   HGBA1C 5.2 12/08/2019   HGBA1C 5.4 02/21/2017   HGBA1C 5.9 12/30/2007   Lab Results  Component Value Date   INSULIN 4.6 09/20/2020   INSULIN 18.1 02/21/2017   Lab Results  Component Value Date   TSH 1.650 09/20/2020   Lab Results  Component Value Date   CHOL 215 (H) 09/20/2020   HDL 51 09/20/2020   LDLCALC 133 (H) 09/20/2020   LDLDIRECT 120.0 12/08/2019   TRIG 175 (H) 09/20/2020   CHOLHDL 4 12/08/2019   Lab Results  Component Value Date   WBC 6.8 09/20/2020   HGB 13.8 09/20/2020   HCT 41.1 09/20/2020   MCV 88 09/20/2020   PLT 239 09/20/2020   Lab Results  Component Value Date   IRON 89 10/09/2017   FERRITIN 401.1 (H) 10/09/2017    Attestation Statements:   Reviewed by clinician on day of visit: allergies, medications, problem list, medical history, surgical history, family history, social history, and previous encounter notes.  Coral Ceo, am acting as transcriptionist for Coralie Common, MD.   I have reviewed the above documentation for accuracy and completeness, and I agree with the above. - Jinny Blossom, MD

## 2020-12-23 ENCOUNTER — Telehealth: Payer: Self-pay

## 2020-12-23 NOTE — Telephone Encounter (Signed)
[name] [name] (Key: [id number])  Your information has been submitted to [facility]. To check for an updated outcome later, reopen this PA request from your dashboard.  If [facility] has not responded to your request within 24 hours, contact Altha at [phone]. If you think there may be a problem with your PA request, use our live chat feature at the bottom right.

## 2020-12-23 NOTE — Telephone Encounter (Signed)
Your prior authorization for Saxenda has been approved! MORE INFO For eligible patients, copay assistance may be available. To learn more and be redirected to the Saxenda website, click on the "More Info" button to the right. Please also note that you may need to schedule a follow-up visit with your patient prior to the expiration of this prior authorization, as updated patient weight may be required for reauthorization.  Message from plan: Your PA request has been approved. Additional information will be provided in the approval communication. (Message 1145) 

## 2020-12-28 ENCOUNTER — Encounter: Payer: Self-pay | Admitting: Internal Medicine

## 2020-12-30 ENCOUNTER — Other Ambulatory Visit: Payer: Self-pay | Admitting: Internal Medicine

## 2021-01-04 ENCOUNTER — Ambulatory Visit (INDEPENDENT_AMBULATORY_CARE_PROVIDER_SITE_OTHER): Payer: BC Managed Care – PPO | Admitting: Family Medicine

## 2021-01-13 ENCOUNTER — Encounter: Payer: Self-pay | Admitting: Cardiology

## 2021-01-13 ENCOUNTER — Ambulatory Visit (INDEPENDENT_AMBULATORY_CARE_PROVIDER_SITE_OTHER): Payer: BC Managed Care – PPO | Admitting: Cardiology

## 2021-01-13 ENCOUNTER — Other Ambulatory Visit: Payer: Self-pay

## 2021-01-13 VITALS — BP 138/80 | HR 94 | Ht 66.0 in | Wt 217.8 lb

## 2021-01-13 DIAGNOSIS — G4733 Obstructive sleep apnea (adult) (pediatric): Secondary | ICD-10-CM | POA: Diagnosis not present

## 2021-01-13 DIAGNOSIS — J3089 Other allergic rhinitis: Secondary | ICD-10-CM | POA: Diagnosis not present

## 2021-01-13 DIAGNOSIS — I10 Essential (primary) hypertension: Secondary | ICD-10-CM

## 2021-01-13 DIAGNOSIS — E669 Obesity, unspecified: Secondary | ICD-10-CM | POA: Diagnosis not present

## 2021-01-13 NOTE — Addendum Note (Signed)
Addended by: Antonieta Iba on: 01/13/2021 04:42 PM   Modules accepted: Orders

## 2021-01-13 NOTE — Addendum Note (Signed)
Addended by: Antonieta Iba on: 01/13/2021 03:01 PM   Modules accepted: Orders

## 2021-01-13 NOTE — Progress Notes (Signed)
[facility] [place] Phone: [phone] Subjective:   [name] [name], am serving as a scribe for Dr. Hulan Saas. This visit occurred during the SARS-CoV-2 public health emergency.  Safety protocols were in place, including screening questions prior to the visit, additional usage of staff PPE, and extensive cleaning of exam room while observing appropriate contact time as indicated for disinfecting solutions.   I'm seeing this patient by the request  of:  [name] Rail, MD  CC: Shoulder pain follow-up  [id number]   11/16/2020 Injection given today.  Tolerated the procedure well.  History of multiple sclerosis as well as the right-sided breast cancer. [name] [name] that this could be contributing some weakness.  We discussed again about advanced imaging but patient wanted to try the injections again.  Worsening pain I would highly recommend the MRI.  Patient is in agreement with the plan.  New x-rays are ordered today to further evaluate follow-up with me again in 2 months otherwise.  Repeat injection given again today. Tolerated the procedure well, discussed icing regimen and home exercises. We will repeat x-rays to further evaluate. Encourage patient that if [name] improvement in 3 to 4 weeks I would like an MRI.  Update 01/18/2021 [name] [name] is a 60 y.o. female coming in with complaint of R shoulder pain. Would like injection in shoulder. Feels that last injections did help her pain.  Patient still feels like she is feeling 90% better.  Doing the home exercises which has been helpful as well.  Also complaining of L shoulder pain over superior and anterior aspect. Does have pain at night which makes it hard to sleep. Pain with flexion and abduction.       Past Medical History:  Diagnosis Date  . Acute non-recurrent maxillary sinusitis 01/08/2017  . Allergic rhinitis due to other allergen   . Allergy   . Angioedema    ACEI   . 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 Anxiety   . Breast cancer of upper-outer quadrant of right female breast ([place]) 10/23/2014   s/p r mastectomy, neoadj chemo completed 04/21/15; neg genetic testing  . 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  . CHEST PAIN-UNSPECIFIED 05/19/2010   Qualifier: Diagnosis of  By: Aundra Dubin, MD, [place]    . Chronic fatigue 06/08/2016  . Colon polyps 05/2014   adenomatous, colo q 5y  . Conjunctiva disorder 08/06/2018  . CONSTIPATION, CHRONIC   . Cough 04/25/2016  . Depression 09/03/2016  . Ear canal dryness 01/23/2018  . FATIGUE 06/24/2008   Qualifier: Diagnosis of  By: Wynona Luna   . GERD   . Herpes zoster without complication 1/69/6789  . Hot flashes   . HYPERLIPIDEMIA   . HYPERTENSION   . Hypokalemia 12/10/2014  . Lactose intolerance   . LRTI (lower respiratory tract infection) 09/05/2017  . LUQ cramping 04/21/2017  . Migraine headache   . MIGRAINES, HX OF 09/18/2007   Qualifier: Diagnosis of  By: Danelle Earthly CMA, Darlene    . MS (multiple sclerosis) ([place]) 11/03/2015  . Multiple sclerosis ([place])   . Muscle strain 10/21/2016  . OBESITY, TRUNCAL   . OTHER SYMPTOMS INVOLVING DIGESTIVE SYSTEM OTHER 12/14/2009   Qualifier: Diagnosis of  By: Asa Lente MD, Jannifer Rodney   . Otitis, externa, infective 05/24/2016  . PERSONAL HX COLONIC POLYPS 04/21/2009   Qualifier: Diagnosis of  By: Trellis Paganini PA-c, Amy S   . SINUSITIS 01/14/2010   Qualifier: Diagnosis of  By: Asa Lente  MD, Jannifer Rodney   . Sleep apnea   . Upper respiratory tract infection 09/23/2016  . UTI 08/19/2010   Qualifier: Diagnosis of  By: Marca Ancona RMA, Lucy    . Wheezing 09/10/2017   Past Surgical History:  Procedure Laterality Date  . BREAST BIOPSY Right    [place]   . COLONOSCOPY    . MASTECTOMY Right   . PORT-A-CATH REMOVAL Left 05/13/2015   Procedure: REMOVAL PORT-A-CATH;  Surgeon: [name], MD;  Location: [facility];  Service: General;  Laterality: Left;  . PORTACATH PLACEMENT Left 11/06/2014    Procedure: INSERTION PORT-A-CATH;  Surgeon: [name], MD;  Location: [facility];  Service: General;  Laterality: Left;  . SIMPLE MASTECTOMY WITH AXILLARY SENTINEL NODE BIOPSY Right 05/13/2015   Procedure: RIGHT TOTAL  MASTECTOMY WITH RIGHT  AXILLARY SENTINEL NODE BIOPSY;  Surgeon: [name], MD;  Location: [facility];  Service: General;  Laterality: Right;  . TUBAL LIGATION     Social History   Socioeconomic History  . Marital status: Single    Spouse name: Not on file  . Number of children: 2  . Years of education: 32  . Highest education level: Not on file  Occupational History  . Occupation: Solicitor: [employer]  Tobacco Use  . Smoking status: Never Smoker  . Smokeless tobacco: Never Used  Vaping Use  . Vaping Use: Never used  Substance and Sexual Activity  . Alcohol use: [name]    Alcohol/week: 0.0 standard drinks  . Drug use: [name]  . Sexual activity: Not on file  Other Topics Concern  . 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    Patient is single and lives alone.    Patient is right handed.    Social Determinants of Health   Financial Resource Strain: Not on file  Food Insecurity: Not on file  Transportation Needs: Not on file  Physical Activity: Not on file  Stress: Not on file  Social Connections: Not on file   Allergies  Allergen Reactions  . Ace Inhibitors Anaphylaxis     Angioedema (tongue swelling)  . Clarithromycin Anaphylaxis    Throat swells  . Contrave [Naltrexone-Bupropion Hcl Er]     Chest and sob  . Levaquin [Levofloxacin] Other (See Comments)    Tendon pain - shoulder and calf   Family History  Problem Relation Age of Onset  . Coronary artery disease Mother   . Heart disease Mother   . Diabetes Mother   . Hypertension Mother   . Stroke Mother   . Alcohol abuse Father   . COPD Maternal  Grandmother   . Hypertension Other   . Hyperlipidemia Other   . Colon cancer Neg Hx   . Rectal cancer Neg Hx   . Stomach cancer Neg Hx   . Esophageal cancer Neg Hx      Current Outpatient Medications (Cardiovascular):  .  hydrochlorothiazide (HYDRODIURIL) 25 MG tablet, TAKE 1 TABLET EVERY DAY .  losartan (COZAAR) 50 MG tablet, TAKE 1 TABLET(50 MG) BY MOUTH DAILY .  metoprolol tartrate (LOPRESSOR) 50 MG tablet, TAKE 1 TABLET(50 MG) BY MOUTH TWICE DAILY  Current Outpatient Medications (Respiratory):  .  azelastine (ASTELIN) 0.1 % nasal spray, azelastine 137 mcg (0.1 %) nasal spray aerosol .  budesonide-formoterol (SYMBICORT) 160-4.5 MCG/ACT inhaler, Inhale 2 puffs into the lungs 2 (two) times daily. [name]  fluticasone (FLONASE) 50 MCG/ACT nasal spray, SHAKE LIQUID AND USE 2 SPRAYS IN EACH NOSTRIL DAILY .  loratadine (CLARITIN) 10 MG tablet, Take 10 mg by mouth daily.   Current Outpatient Medications (Hematological):  .  ferrous gluconate (FERGON) 324 MG tablet, Take 1 tablet (324 mg total) by mouth 2 (two) times daily with a meal. (Patient taking differently: Take 324 mg by mouth 3 (three) times a week.) .  rivaroxaban (XARELTO) 20 MG TABS tablet, TAKE 1 TABLET(20 MG) BY MOUTH DAILY WITH SUPPER  Current Outpatient Medications (Other):  [name]  ALPRAZolam (XANAX) 0.5 MG tablet, Take 1 tablet (0.5 mg total) by mouth 2 (two) times daily as needed for anxiety or sleep. [name]  b complex vitamins tablet, Take 1 tablet by mouth daily. .  BD PEN NEEDLE MICRO U/F 32G X 6 MM MISC, Inject into the skin. [name]  COPAXONE 40 MG/ML SOSY, INJECT ONE SYRINGE SUBCUTANEOUSLY THREE TIMES PER WEEK AT LEAST 48 HOURS APART. ALLOW SYRINGE TO WARM TO ROOM TEMPERATURE FOR 20 MINUTES. REFRIGERATE. [name]  gabapentin (NEURONTIN) 100 MG capsule, TAKE 3 CAPSULES BY MOUTH THREE TIMES DAILY .  ketoconazole (NIZORAL) 2 % shampoo, Apply topically. [name]  KLOR-CON M10 10 MEQ tablet, TAKE 4 TABLETS BY MOUTH EVERY DAY .  linaclotide (LINZESS) 145  MCG CAPS capsule, Take 1 capsule (145 mcg total) by mouth daily before breakfast. .  pantoprazole (PROTONIX) 40 MG tablet, TAKE 1 TABLET BY MOUTH EVERY DAY .  potassium chloride (KLOR-CON) 10 MEQ tablet, TAKE 4 TABLETS(40 MEQ) BY MOUTH DAILY .  Tart Cherry 1200 MG CAPS, Take by mouth. .  TURMERIC PO, Take by mouth daily. .  valACYclovir (VALTREX) 500 MG tablet, Take 500 mg by mouth 2 (two) times daily. [name]  VITAMIN D PO, Take 2,000 Units by mouth daily.   Reviewed prior external information including notes and imaging from  primary care provider As well as notes that were available from care everywhere and other healthcare systems.  Past medical history, social, surgical and family history all reviewed in electronic medical record.  [name] pertanent information unless stated regarding to the chief complaint.   Review of Systems:  [name] headache, visual changes, nausea, vomiting, diarrhea, constipation, dizziness, abdominal pain, skin rash, fevers, chills, night sweats, weight loss, swollen lymph nodes, body aches, joint swelling, chest pain, shortness of breath, mood changes. POSITIVE muscle aches  Objective  Blood pressure 122/78, pulse 71, height 5\' 6"  (1.676 m), weight 215 lb (97.5 kg), last menstrual period 11/01/2012, SpO2 98 %.   General: [name] apparent distress alert and oriented x3 mood and affect normal, dressed appropriately.  HEENT: Pupils equal, extraocular movements intact  Respiratory: Patient's speak in full sentences and does not appear short of breath  Cardiovascular: [name] lower extremity edema, non tender, [name] erythema  Gait normal with good balance and coordination.  MSK: Right shoulder exam seems to be doing relatively well.  Patient has good range of motion.  Very mild positive crossover test noted.  Very minimal pain over the acromioclavicular joint.  5 out of 5 strength of the rotator cuff.  Contralateral shoulder has very mild positive and impingement with Neer's and Hawkins but  5 out of 5 strength of the rotator cuff full range of motion noted.    Impression and Recommendations:     The above documentation has been reviewed and is accurate and complete Lyndal Pulley, DO

## 2021-01-13 NOTE — Progress Notes (Addendum)
Date:  01/13/2021   ID:  [name] [name], DOB 1961-03-01, MRN [id number]  PCP:  Binnie Rail, MD  Cardiologist:  Fransico Him, MD  Electrophysiologist:  None   Chief Complaint:  OSA,  HTN  History of Present Illness:    [name] [name] is a 60 y.o. female  with a hx of MS, insomnia, RLS, obesity and HTNand PAFlutter. She is on Xarelto for a CHADS2VASC score of 2. A sleep study was ordered and she underwent PSG showing mild OSA with an AHI of 8 and is now on CPAP. She started having problems with severe bloating that would last for days at a time.  I added a chin strap and set her on 8cm H2O.    She is doing well with her CPAP device and thinks that she has gotten used to it.  She tolerates the mask and feels the pressure is adequate.  Since going on CPAP she feels rested in the am and has no significant daytime sleepiness.  She has not been compliant with it because she will get spells where she feels like she has a head cold and gets sneezing and stuffed up nose.  She is getting a new mask so she is hopeful that will help. She denies any significant mouth or nasal dryness or nasal congestion.  She does not think that he snores.    Prior CV studies:   The following studies were reviewed today:  PAP compliance download  Past Medical History:  Diagnosis Date  . Acute non-recurrent maxillary sinusitis 01/08/2017  . Allergic rhinitis due to other allergen   . Allergy   . Angioedema    ACEI  . 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 Anxiety   . Breast cancer of upper-outer quadrant of right female breast ([place]) 10/23/2014   s/p r mastectomy, neoadj chemo completed 04/21/15; neg genetic testing  . 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  . CHEST PAIN-UNSPECIFIED 05/19/2010   Qualifier: Diagnosis of  By: Aundra Dubin, MD, [place]    . Chronic fatigue 06/08/2016  . Colon polyps 05/2014   adenomatous, colo q 5y  . Conjunctiva disorder 08/06/2018  . CONSTIPATION,  CHRONIC   . Cough 04/25/2016  . Depression 09/03/2016  . Ear canal dryness 01/23/2018  . FATIGUE 06/24/2008   Qualifier: Diagnosis of  By: Wynona Luna   . GERD   . Herpes zoster without complication 2/67/1245  . Hot flashes   . HYPERLIPIDEMIA   . HYPERTENSION   . Hypokalemia 12/10/2014  . Lactose intolerance   . LRTI (lower respiratory tract infection) 09/05/2017  . LUQ cramping 04/21/2017  . Migraine headache   . MIGRAINES, HX OF 09/18/2007   Qualifier: Diagnosis of  By: Danelle Earthly CMA, Darlene    . MS (multiple sclerosis) ([place]) 11/03/2015  . Multiple sclerosis ([place])   . Muscle strain 10/21/2016  . OBESITY, TRUNCAL   . OTHER SYMPTOMS INVOLVING DIGESTIVE SYSTEM OTHER 12/14/2009   Qualifier: Diagnosis of  By: Asa Lente MD, Jannifer Rodney   . Otitis, externa, infective 05/24/2016  . PERSONAL HX COLONIC POLYPS 04/21/2009   Qualifier: Diagnosis of  By: Trellis Paganini PA-c, Amy S   . SINUSITIS 01/14/2010   Qualifier: Diagnosis of  By: Asa Lente MD, Jannifer Rodney Sleep apnea   . Upper respiratory tract infection 09/23/2016  . UTI 08/19/2010   Qualifier: Diagnosis of  By: Marca Ancona RMA, Lucy    . Wheezing 09/10/2017   Past Surgical History:  Procedure Laterality Date  .  BREAST BIOPSY Right    [place]   . COLONOSCOPY    . MASTECTOMY Right   . PORT-A-CATH REMOVAL Left 05/13/2015   Procedure: REMOVAL PORT-A-CATH;  Surgeon: [name], MD;  Location: [facility];  Service: General;  Laterality: Left;  . PORTACATH PLACEMENT Left 11/06/2014   Procedure: INSERTION PORT-A-CATH;  Surgeon: [name], MD;  Location: [facility];  Service: General;  Laterality: Left;  . SIMPLE MASTECTOMY WITH AXILLARY SENTINEL NODE BIOPSY Right 05/13/2015   Procedure: RIGHT TOTAL  MASTECTOMY WITH RIGHT  AXILLARY SENTINEL NODE BIOPSY;  Surgeon: [name], MD;  Location: [facility];  Service: General;  Laterality: Right;  . TUBAL LIGATION       Current Meds  Medication Sig  . ALPRAZolam (XANAX) 0.5 MG tablet Take 1  tablet (0.5 mg total) by mouth 2 (two) times daily as needed for anxiety or sleep.  [name] azelastine (ASTELIN) 0.1 % nasal spray azelastine 137 mcg (0.1 %) nasal spray aerosol  . b complex vitamins tablet Take 1 tablet by mouth daily.  . BD PEN NEEDLE MICRO U/F 32G X 6 MM MISC Inject into the skin.  . budesonide-formoterol (SYMBICORT) 160-4.5 MCG/ACT inhaler Inhale 2 puffs into the lungs 2 (two) times daily.  [name] COPAXONE 40 MG/ML SOSY INJECT ONE SYRINGE SUBCUTANEOUSLY THREE TIMES PER WEEK AT LEAST 48 HOURS APART. ALLOW SYRINGE TO WARM TO ROOM TEMPERATURE FOR 20 MINUTES. REFRIGERATE.  . ferrous gluconate (FERGON) 324 MG tablet Take 1 tablet (324 mg total) by mouth 2 (two) times daily with a meal. (Patient taking differently: Take 324 mg by mouth 3 (three) times a week.)  . fluticasone (FLONASE) 50 MCG/ACT nasal spray SHAKE LIQUID AND USE 2 SPRAYS IN EACH NOSTRIL DAILY  . gabapentin (NEURONTIN) 100 MG capsule TAKE 3 CAPSULES BY MOUTH THREE TIMES DAILY  . hydrochlorothiazide (HYDRODIURIL) 25 MG tablet TAKE 1 TABLET EVERY DAY  . ketoconazole (NIZORAL) 2 % shampoo Apply topically.  [name] KLOR-CON M10 10 MEQ tablet TAKE 4 TABLETS BY MOUTH EVERY DAY  . linaclotide (LINZESS) 145 MCG CAPS capsule Take 1 capsule (145 mcg total) by mouth daily before breakfast.  . loratadine (CLARITIN) 10 MG tablet Take 10 mg by mouth daily.  [name] losartan (COZAAR) 50 MG tablet TAKE 1 TABLET(50 MG) BY MOUTH DAILY  . metoprolol tartrate (LOPRESSOR) 50 MG tablet TAKE 1 TABLET(50 MG) BY MOUTH TWICE DAILY  . pantoprazole (PROTONIX) 40 MG tablet TAKE 1 TABLET BY MOUTH EVERY DAY  . potassium chloride (KLOR-CON) 10 MEQ tablet TAKE 4 TABLETS(40 MEQ) BY MOUTH DAILY  . rivaroxaban (XARELTO) 20 MG TABS tablet TAKE 1 TABLET(20 MG) BY MOUTH DAILY WITH SUPPER  . Tart Cherry 1200 MG CAPS Take by mouth.  . TURMERIC PO Take by mouth daily.  . valACYclovir (VALTREX) 500 MG tablet Take 500 mg by mouth 2 (two) times daily.  [name] VITAMIN D PO Take 2,000  Units by mouth daily.     Allergies:   Ace inhibitors, Clarithromycin, Contrave [naltrexone-bupropion hcl er], and Levaquin [levofloxacin]   Social History   Tobacco Use  . Smoking status: Never Smoker  . Smokeless tobacco: Never Used  Vaping Use  . Vaping Use: Never used  Substance Use Topics  . Alcohol use: No    Alcohol/week: 0.0 standard drinks  . Drug use: No     Family Hx: The patient's family history includes Alcohol abuse in her father; COPD in her maternal grandmother; Coronary artery disease in her mother; Diabetes in her mother;  Heart disease in her mother; Hyperlipidemia in an other family member; Hypertension in her mother and another family member; Stroke in her mother. There is no history of Colon cancer, Rectal cancer, Stomach cancer, or Esophageal cancer.  ROS:   Please see the history of present illness.     All other systems reviewed and are negative.   Labs/Other Tests and Data Reviewed:    Recent Labs: 09/20/2020: ALT 21; BUN 9; Creatinine, Ser 0.81; Hemoglobin 13.8; Platelets 239; Potassium 4.1; Sodium 136; TSH 1.650   Recent Lipid Panel Lab Results  Component Value Date/Time   CHOL 215 (H) 09/20/2020 10:38 AM   TRIG 175 (H) 09/20/2020 10:38 AM   HDL 51 09/20/2020 10:38 AM   CHOLHDL 4 12/08/2019 04:06 PM   LDLCALC 133 (H) 09/20/2020 10:38 AM   LDLDIRECT 120.0 12/08/2019 04:06 PM    Wt Readings from Last 3 Encounters:  01/13/21 217 lb 12.8 oz (98.8 kg)  12/13/20 209 lb (94.8 kg)  12/08/20 208 lb (94.3 kg)     Objective:    Vital Signs:  BP 138/80   Pulse 94   Ht 5\' 6"  (1.676 m)   Wt 217 lb 12.8 oz (98.8 kg)   LMP 11/01/2012   SpO2 97%   BMI 35.15 kg/m   GEN: Well nourished, well developed in no acute distress HEENT: Normal NECK: No JVD; No carotid bruits LYMPHATICS: No lymphadenopathy CARDIAC:RRR, no murmurs, rubs, gallops RESPIRATORY:  Clear to auscultation without rales, wheezing or rhonchi  ABDOMEN: Soft, non-tender,  non-distended MUSCULOSKELETAL:  No edema; No deformity  SKIN: Warm and dry NEUROLOGIC:  Alert and oriented x 3 PSYCHIATRIC:  Normal affect     ASSESSMENT & PLAN:    1.  OSA -The patient is tolerating PAP therapy well without any problems. The PAP download was reviewed today and showed an AHI of 0.9/hr on auto CPAP  with 47% compliance in using more than 4 hours nightly.  The patient has been using and benefiting from PAP use and will continue to benefit from therapy.  -I encouraged her to be more compliant with her device. -she was having problems with sneezing and nasal congestion  -she is getting a new mask and I have asked her to let me know if her sx do not resolve -I have recommended referring her to an allergist  2.  HTN -BP is adequately controlled on exam today -continue Lopressor 50mg  BID, HCTZ 25mg  daily and Losartan 50mg  daily -SCr stable at 0.81 and K+ 4.1 in Jan 2022  3.  Obesity -she  walks on the treadmill. -encouraged her to watch her carbs and sugars.    Medication Adjustments/Labs and Tests Ordered: Current medicines are reviewed at length with the patient today.  Concerns regarding medicines are outlined above.  Tests Ordered: No orders of the defined types were placed in this encounter.  Medication Changes: No orders of the defined types were placed in this encounter.   Disposition:  Follow up in 1 year(s)  Signed, Fransico Him, MD  01/13/2021 2:51 PM    [place]

## 2021-01-13 NOTE — Patient Instructions (Addendum)
Medication Instructions:  Your physician recommends that you continue on your current medications as directed. Please refer to the Current Medication list given to you today.  *If you need a refill on your cardiac medications before your next appointment, please call your pharmacy*  Follow-Up: At [facility], you and your health needs are our priority.  As part of our continuing mission to provide you with exceptional heart care, we have created designated Provider Care Teams.  These Care Teams include your primary Cardiologist (physician) and Advanced Practice Providers (APPs -  Physician Assistants and Nurse Practitioners) who all work together to provide you with the care you need, when you need it.  Your next appointment:   1 year(s)  The format for your next appointment:   In Person  Provider:   Fransico Him, MD   Other Instructions You have been referred to see an Allergist

## 2021-01-18 ENCOUNTER — Ambulatory Visit: Payer: Self-pay

## 2021-01-18 ENCOUNTER — Encounter: Payer: Self-pay | Admitting: Family Medicine

## 2021-01-18 ENCOUNTER — Other Ambulatory Visit: Payer: Self-pay

## 2021-01-18 ENCOUNTER — Ambulatory Visit: Payer: BC Managed Care – PPO | Admitting: Family Medicine

## 2021-01-18 VITALS — BP 122/78 | HR 71 | Ht 66.0 in | Wt 215.0 lb

## 2021-01-18 DIAGNOSIS — G8929 Other chronic pain: Secondary | ICD-10-CM

## 2021-01-18 DIAGNOSIS — M25511 Pain in right shoulder: Secondary | ICD-10-CM | POA: Diagnosis not present

## 2021-01-18 DIAGNOSIS — M25512 Pain in left shoulder: Secondary | ICD-10-CM

## 2021-01-18 DIAGNOSIS — M19011 Primary osteoarthritis, right shoulder: Secondary | ICD-10-CM | POA: Diagnosis not present

## 2021-01-18 DIAGNOSIS — M12811 Other specific arthropathies, not elsewhere classified, right shoulder: Secondary | ICD-10-CM

## 2021-01-18 NOTE — Assessment & Plan Note (Addendum)
Patient feels like she is doing significantly better at this time.  I discussed with patient if worsening pain do need to consider the possibility of advanced imaging.  Patient will has improved with her strength and range of motion patient would like to hold on the imaging secondary to this..  Hold on injections today but follow-up again in 2 months

## 2021-01-18 NOTE — Patient Instructions (Signed)
Ice 20 min before bed for pain relief Pennsaid 2x a day then switch to Voltaren gel See me again in 2 months but call if you need [place] sooner

## 2021-01-18 NOTE — Assessment & Plan Note (Signed)
Patient has improved with range of motion at this time.  Once again discussed the possibility of advanced imaging.  Patient declined.  Patient wants to continue with conservative therapy at this time.  We will get x-rays secondary to the pain in the contralateral side.  Follow-up again in 4 8 weeks

## 2021-01-20 ENCOUNTER — Other Ambulatory Visit: Payer: Self-pay

## 2021-01-20 ENCOUNTER — Other Ambulatory Visit: Payer: Self-pay | Admitting: Family Medicine

## 2021-01-26 ENCOUNTER — Other Ambulatory Visit: Payer: Self-pay | Admitting: Internal Medicine

## 2021-01-27 ENCOUNTER — Other Ambulatory Visit: Payer: Self-pay | Admitting: Internal Medicine

## 2021-01-31 ENCOUNTER — Other Ambulatory Visit: Payer: Self-pay

## 2021-01-31 ENCOUNTER — Telehealth (INDEPENDENT_AMBULATORY_CARE_PROVIDER_SITE_OTHER): Payer: BC Managed Care – PPO | Admitting: Internal Medicine

## 2021-01-31 ENCOUNTER — Encounter: Payer: Self-pay | Admitting: Internal Medicine

## 2021-01-31 DIAGNOSIS — J0100 Acute maxillary sinusitis, unspecified: Secondary | ICD-10-CM | POA: Diagnosis not present

## 2021-01-31 MED ORDER — AZITHROMYCIN 250 MG PO TABS: ORAL_TABLET | ORAL | 0 refills | Status: DC

## 2021-01-31 MED ORDER — HYDROCOD POLST-CPM POLST ER 10-8 MG/5ML PO SUER: 5.0000 mL | Freq: Two times a day (BID) | ORAL | 0 refills | Status: DC | PRN

## 2021-01-31 NOTE — Assessment & Plan Note (Signed)
Acute Symptoms consistent with sinus infection Has long history of sinus infections Start Z-Pak-this has been very effective for her Continue allergy medication, asthma medications, over-the-counter cold medication for symptom relief Rest, fluids Call if no improvement

## 2021-01-31 NOTE — Progress Notes (Signed)
Virtual Visit via Video Note  I connected with [name] [name] on 01/31/21 at  8:50 AM EDT by a video enabled telemedicine application and verified that I am speaking with the correct person using two identifiers.   I discussed the limitations of evaluation and management by telemedicine and the availability of in person appointments. The patient expressed understanding and agreed to proceed.  Present for the visit:  Myself, Dr Billey Gosling, [name] [name].  The patient is currently at home and I am in the office.    No referring provider.    History of Present Illness: She is here for an acute visit for cold symptoms.   Her symptoms started several days ago  She is experiencing teeth pain, sinus pain and pressure, low-grade fever, ST, PND, productive cough of discolored mucus.  She has had some wheezing and headaches.  She has been using her inhaler, taking her allergy medications.  She does have frequent sinus infections.  She will be seeing an allergist soon.     Review of Systems  Constitutional: Positive for fever (low grade).  HENT: Positive for sinus pain and sore throat. Negative for ear pain.        Teeth pain, PND  Respiratory: Positive for cough, sputum production and wheezing. Negative for shortness of breath.   Cardiovascular: Negative for chest pain.  Gastrointestinal: Negative for abdominal pain, diarrhea and nausea.  Musculoskeletal: Negative for myalgias.  Neurological: Positive for headaches. Negative for dizziness.     Social History   Socioeconomic History  . Marital status: Single    Spouse name: Not on file  . Number of children: 2  . Years of education: 80  . Highest education level: Not on file  Occupational History  . Occupation: Solicitor: [employer]  Tobacco Use  . Smoking status: Never Smoker  . Smokeless tobacco: Never Used  Vaping Use  . Vaping Use: Never used  Substance and Sexual Activity  . Alcohol  use: No    Alcohol/week: 0.0 standard drinks  . Drug use: No  . Sexual activity: Not on file  Other Topics Concern  . 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    Patient is single and lives alone.    Patient is right handed.    Social Determinants of Health   Financial Resource Strain: Not on file  Food Insecurity: Not on file  Transportation Needs: Not on file  Physical Activity: Not on file  Stress: Not on file  Social Connections: Not on file     Observations/Objective: Appears well in NAD Breathing normally  Assessment and Plan:  See Problem List for Assessment and Plan of chronic medical problems.   Follow Up Instructions:    I discussed the assessment and treatment plan with the patient. The patient was provided an opportunity to ask questions and all were answered. The patient agreed with the plan and demonstrated an understanding of the instructions.   The patient was advised to call back or seek an in-person evaluation if the symptoms worsen or if the condition fails to improve as anticipated.    Binnie Rail, MD

## 2021-02-01 ENCOUNTER — Encounter: Payer: Self-pay | Admitting: Internal Medicine

## 2021-02-02 MED ORDER — FLUCONAZOLE 150 MG PO TABS: 150.0000 mg | ORAL_TABLET | Freq: Once | ORAL | 0 refills | Status: AC

## 2021-02-10 ENCOUNTER — Other Ambulatory Visit: Payer: Self-pay | Admitting: Internal Medicine

## 2021-02-22 ENCOUNTER — Other Ambulatory Visit: Payer: Self-pay | Admitting: Internal Medicine

## 2021-03-04 ENCOUNTER — Other Ambulatory Visit: Payer: Self-pay | Admitting: Internal Medicine

## 2021-03-08 ENCOUNTER — Other Ambulatory Visit: Payer: Self-pay | Admitting: Internal Medicine

## 2021-03-09 ENCOUNTER — Encounter: Payer: Self-pay | Admitting: Internal Medicine

## 2021-03-10 MED ORDER — PANTOPRAZOLE SODIUM 40 MG PO TBEC: 1.0000 | DELAYED_RELEASE_TABLET | Freq: Every day | ORAL | 1 refills | Status: DC

## 2021-03-14 ENCOUNTER — Encounter: Payer: Self-pay | Admitting: Internal Medicine

## 2021-03-14 DIAGNOSIS — G5711 Meralgia paresthetica, right lower limb: Secondary | ICD-10-CM

## 2021-03-14 MED ORDER — GABAPENTIN 100 MG PO CAPS: 300.0000 mg | ORAL_CAPSULE | Freq: Three times a day (TID) | ORAL | 1 refills | Status: DC

## 2021-03-14 NOTE — Addendum Note (Signed)
Addended by: Binnie Rail on: 03/14/2021 04:47 PM   Modules accepted: Orders

## 2021-03-14 NOTE — Progress Notes (Signed)
[name] [place] Phone: [phone] Subjective:   [name] [name], am serving as a scribe for Dr. Hulan Saas.  I'm seeing this patient by the request  of:  Binnie Rail, MD  CC: Bilateral shoulder pain  EHM:[id number]  01/18/2021 Patient has improved with range of motion at this time.  Once again discussed the possibility of advanced imaging.  Patient declined.  Patient wants to continue with conservative therapy at this time.  We will get x-rays secondary to the pain in the contralateral side.  Follow-up again in 4 8 weeks  Patient feels like she is doing significantly better at this time.  I discussed with patient if worsening pain do need to consider the possibility of advanced imaging.  Patient will has improved with her strength and range of motion patient would like to hold on the imaging secondary to this..  Hold on injections today but follow-up again in 2 months  Update 03/15/2021 [name] [name] is a 60 y.o. female coming in with complaint of B shoulder pain. Patient states that that she doing a lot better form the injections the time before. States still feels some slight pain in the right shoulder but nothing like it was. Significant discomfort was gone at the moment.  Not has anything that stopping her from activity.  Still limited range of motion on the right side but nothing severe.      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   CHEST PAIN-UNSPECIFIED 05/19/2010   Qualifier: Diagnosis of  By: Aundra Dubin, MD, [place]      Chronic fatigue 06/08/2016   Colon polyps 05/2014   adenomatous, colo q 5y   Conjunctiva disorder 08/06/2018   CONSTIPATION, CHRONIC    Cough 04/25/2016   Depression 09/03/2016   Ear canal dryness 01/23/2018   FATIGUE 06/24/2008   Qualifier: Diagnosis of  By: Wynona Luna    GERD    Herpes zoster without complication 6/62/9476   Hot flashes    HYPERLIPIDEMIA    HYPERTENSION    Hypokalemia 12/10/2014   Lactose intolerance    LRTI (lower respiratory tract infection) 09/05/2017   LUQ cramping 04/21/2017   Migraine headache    MIGRAINES, HX OF 09/18/2007   Qualifier: Diagnosis of  By: Danelle Earthly CMA, Darlene     MS (multiple sclerosis) ([place]) 11/03/2015   Multiple sclerosis ([place])    Muscle strain 10/21/2016   OBESITY, TRUNCAL    OTHER SYMPTOMS INVOLVING DIGESTIVE SYSTEM OTHER 12/14/2009   Qualifier: Diagnosis of  By: Asa Lente MD, Valerie A    Otitis, externa, infective 05/24/2016   PERSONAL HX COLONIC POLYPS 04/21/2009   Qualifier: Diagnosis of  By: Shane Crutch, Amy S    SINUSITIS 01/14/2010   Qualifier: Diagnosis of  By: Asa Lente MD, Jannifer Rodney    Sleep apnea    Upper respiratory tract infection 09/23/2016   UTI 08/19/2010   Qualifier: Diagnosis of  By: Reatha Armour, Lucy     Wheezing 09/10/2017   Past Surgical History:  Procedure Laterality Date   BREAST BIOPSY  Right    [place]    COLONOSCOPY     MASTECTOMY Right    PORT-A-CATH REMOVAL Left 05/13/2015   Procedure: REMOVAL PORT-A-CATH;  Surgeon: [name], MD;  Location: [facility];  Service: General;  Laterality: Left;   PORTACATH PLACEMENT Left 11/06/2014   Procedure: INSERTION PORT-A-CATH;  Surgeon: [name], MD;  Location: [facility];  Service: General;  Laterality: Left;   SIMPLE MASTECTOMY WITH AXILLARY SENTINEL NODE BIOPSY Right 05/13/2015   Procedure: RIGHT TOTAL  MASTECTOMY WITH RIGHT  AXILLARY SENTINEL NODE BIOPSY;  Surgeon: [name], MD;  Location: [facility];  Service: General;  Laterality: Right;   TUBAL  LIGATION     Social History   Socioeconomic History   Marital status: Single    Spouse name: Not on file   Number of children: 2   Years of education: 12   Highest education level: Not on file  Occupational History   Occupation: Solicitor: [employer]  Tobacco Use   Smoking status: Never   Smokeless tobacco: Never  Vaping Use   Vaping Use: Never used  Substance and Sexual Activity   Alcohol use: No    Alcohol/week: 0.0 standard drinks   Drug use: No   Sexual activity: Not on file  Other Topics Concern   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    Patient is single and lives alone.    Patient is right handed.    Social Determinants of Health   Financial Resource Strain: Not on file  Food Insecurity: Not on file  Transportation Needs: Not on file  Physical Activity: Not on file  Stress: Not on file  Social Connections: Not on file   Allergies  Allergen Reactions   Ace Inhibitors Anaphylaxis     Angioedema (tongue swelling)   Clarithromycin Anaphylaxis    Throat swells   Contrave [Naltrexone-Bupropion Hcl Er]     Chest and sob   Levaquin [Levofloxacin] Other (See Comments)    Tendon pain - shoulder and calf   Family History  Problem Relation Age of Onset   Coronary artery disease Mother    Heart disease Mother    Diabetes Mother    Hypertension Mother    Stroke Mother    Alcohol abuse Father    COPD Maternal Grandmother    Hypertension Other    Hyperlipidemia Other    Colon cancer Neg Hx    Rectal cancer Neg Hx    Stomach cancer Neg Hx    Esophageal cancer Neg Hx      Current Outpatient Medications (Cardiovascular):    hydrochlorothiazide (HYDRODIURIL) 25 MG tablet, TAKE 1 TABLET EVERY DAY   losartan (COZAAR) 50 MG tablet, TAKE 1 TABLET EVERY DAY   metoprolol tartrate (LOPRESSOR) 50 MG tablet, TAKE 1 TABLET TWICE  DAILY  Current Outpatient Medications (Respiratory):    azelastine (ASTELIN) 0.1 % nasal spray, azelastine 137 mcg (0.1 %) nasal spray aerosol   budesonide-formoterol (SYMBICORT) 160-4.5 MCG/ACT inhaler, Inhale 2 puffs into the lungs 2 (two) times daily.   chlorpheniramine-HYDROcodone (TUSSIONEX PENNKINETIC ER) 10-8 MG/5ML SUER, Take 5 mLs by mouth every 12 (twelve) hours as needed for cough.   fluticasone (FLONASE) 50 MCG/ACT nasal spray, SHAKE LIQUID AND USE 2 SPRAYS IN EACH NOSTRIL DAILY   loratadine (CLARITIN) 10 MG tablet, Take 10 mg by mouth daily.   Current  Outpatient Medications (Hematological):    ferrous gluconate (FERGON) 324 MG tablet, Take 1 tablet (324 mg total) by mouth 2 (two) times daily with a meal. (Patient taking differently: Take 324 mg by mouth 3 (three) times a week.)   rivaroxaban (XARELTO) 20 MG TABS tablet, TAKE 1 TABLET(20 MG) BY MOUTH DAILY WITH SUPPER  Current Outpatient Medications (Other):    ALPRAZolam (XANAX) 0.5 MG tablet, Take 1 tablet (0.5 mg total) by mouth 2 (two) times daily as needed for anxiety or sleep.   b complex vitamins tablet, Take 1 tablet by mouth daily.   BD PEN NEEDLE MICRO U/F 32G X 6 MM MISC, Inject into the skin.   COPAXONE 40 MG/ML SOSY, INJECT ONE SYRINGE SUBCUTANEOUSLY THREE TIMES PER WEEK AT LEAST 48 HOURS APART. ALLOW SYRINGE TO WARM TO ROOM TEMPERATURE FOR 20 MINUTES. REFRIGERATE.   gabapentin (NEURONTIN) 100 MG capsule, Take 3 capsules (300 mg total) by mouth 3 (three) times daily.   ketoconazole (NIZORAL) 2 % shampoo, Apply topically.   KLOR-CON M10 10 MEQ tablet, TAKE 4 TABLETS BY MOUTH EVERY DAY   LINZESS 145 MCG CAPS capsule, TAKE 1 CAPSULE BY MOUTH DAILY BEFORE BREAKFAST.   pantoprazole (PROTONIX) 40 MG tablet, Take 1 tablet (40 mg total) by mouth daily.   PENNSAID 2 % SOLN, APPLY 2 PUMPS (2 GRAMS) TO AFFECTED AREA TOPICALLY TWICE DAILY AS DIRECTED   potassium chloride (KLOR-CON) 10 MEQ tablet, TAKE 4 TABLETS(40 MEQ) BY MOUTH  DAILY   Tart Cherry 1200 MG CAPS, Take by mouth.   TURMERIC PO, Take by mouth daily.   valACYclovir (VALTREX) 500 MG tablet, Take 500 mg by mouth 2 (two) times daily.   VITAMIN D PO, Take 2,000 Units by mouth daily.   Reviewed prior external information including notes and imaging from  primary care provider As well as notes that were available from care everywhere and other healthcare systems.  Past medical history, social, surgical and family history all reviewed in electronic medical record.  No pertanent information unless stated regarding to the chief complaint.   Review of Systems:  No headache, visual changes, nausea, vomiting, diarrhea, constipation, dizziness, abdominal pain, skin rash, fevers, chills, night sweats, weight loss, swollen lymph nodes, body aches, joint swelling, chest pain, shortness of breath, mood changes.  Objective  Blood pressure 130/80, pulse 75, height 5\' 6"  (1.676 m), weight 215 lb (97.5 kg), last menstrual period 11/01/2012, SpO2 98 %.   General: No apparent distress alert and oriented x3 mood and affect normal, dressed appropriately.  HEENT: Pupils equal, extraocular movements intact  Respiratory: Patient's speak in full sentences and does not appear short of breath  Cardiovascular: No lower extremity edema, non tender, no erythema  Gait normal with good balance and coordination.  MSK: Significant improvement in range of motion of the shoulders bilaterally.  Patient still has some mild decrease in external range of motion of the right shoulder.  Full range of motion of the left.  Rotator cuff strength on the left is 5 out of 5 and 4/5 on the right side.  Neurovascular intact distally.    Impression and Recommendations:    The above documentation has been reviewed and is accurate and complete [name] Pulley, DO

## 2021-03-15 ENCOUNTER — Encounter: Payer: Self-pay | Admitting: Internal Medicine

## 2021-03-15 ENCOUNTER — Ambulatory Visit: Payer: BC Managed Care – PPO | Admitting: Family Medicine

## 2021-03-15 ENCOUNTER — Ambulatory Visit (INDEPENDENT_AMBULATORY_CARE_PROVIDER_SITE_OTHER): Payer: BC Managed Care – PPO

## 2021-03-15 ENCOUNTER — Other Ambulatory Visit: Payer: Self-pay

## 2021-03-15 ENCOUNTER — Encounter: Payer: Self-pay | Admitting: Family Medicine

## 2021-03-15 DIAGNOSIS — M12811 Other specific arthropathies, not elsewhere classified, right shoulder: Secondary | ICD-10-CM | POA: Diagnosis not present

## 2021-03-15 DIAGNOSIS — M25512 Pain in left shoulder: Secondary | ICD-10-CM

## 2021-03-15 DIAGNOSIS — M19011 Primary osteoarthritis, right shoulder: Secondary | ICD-10-CM

## 2021-03-15 NOTE — Patient Instructions (Signed)
Good to see you  Get x-ray on your way out to be safe Thanks for making my job so easy See me again in 2-3 months if needed

## 2021-03-15 NOTE — Assessment & Plan Note (Signed)
Patient known degenerative changes of the right shoulder.  Getting x-ray of the left shoulder just to make sure there is nothing significantly abnormal with patient's history of breast cancer.  Doing significantly better.  Has full range of motion of the left side.  At this point patient can follow-up with me as needed but patient would like to have an appointment in 3 months just in case.

## 2021-03-15 NOTE — Assessment & Plan Note (Signed)
Stable at the moment.  Nearly 4 months.  Follow-up again in 2 to 3 months.

## 2021-03-17 ENCOUNTER — Ambulatory Visit (INDEPENDENT_AMBULATORY_CARE_PROVIDER_SITE_OTHER): Payer: BC Managed Care – PPO | Admitting: Allergy

## 2021-03-17 ENCOUNTER — Other Ambulatory Visit: Payer: Self-pay

## 2021-03-17 ENCOUNTER — Encounter: Payer: Self-pay | Admitting: Allergy

## 2021-03-17 VITALS — BP 132/74 | HR 75 | Temp 97.7°F | Resp 18 | Ht 63.0 in | Wt 218.8 lb

## 2021-03-17 DIAGNOSIS — R062 Wheezing: Secondary | ICD-10-CM

## 2021-03-17 DIAGNOSIS — J3089 Other allergic rhinitis: Secondary | ICD-10-CM | POA: Diagnosis not present

## 2021-03-17 MED ORDER — IPRATROPIUM BROMIDE 0.06 % NA SOLN: 2.0000 | Freq: Four times a day (QID) | NASAL | 5 refills | Status: AC | PRN

## 2021-03-17 NOTE — Patient Instructions (Signed)
-  environmental allergy testing is positive to dust mite, cat and cockroach -allergen avoidance measures discussed/handouts provided -use nasal Atrovent 2 sprays each nostril prior to putting on CPAP at night and have with you during the day for as needed use for nasal drainage or congestion.  Can be use up to 4 times a day if needed -can continue use of your Astelin and Flonase as needed for nasal drainage and congestion respectively for your allergy symptom control -for general allergy symptom relief recommend use of long-acting antihistamine like Allegra or Xyzal -reserve Claritin-D for when you have sinus pressure/pain/headache symptoms  -can use symbicort 2 puffs twice a day as needed for wheeze.    Follow-up in 3 months or sooner if needed

## 2021-03-17 NOTE — Progress Notes (Signed)
New Patient Note  RE: [name] [name] MRN: [id number] DOB: 12/08/1960 Date of Office Visit: 03/17/2021  Referring provider: Sueanne Margarita, MD Primary care provider: Binnie Rail, MD  Chief Complaint: Nasal congestion  History of present illness: [name] [name] is a 60 y.o. female presenting today for consultation for rhinitis.  She states with use of her CPAP machine for OSA she develops sneezing and nasal congestion and drainage that will bothersome the next day.  She states when she first got the CPAP it was during early Covid and she was working from home and had no issues but when she went back in to work she started noticing these issues after CPAP use.  She states she can only wear it like one night a week due to all the sneezing and congestion and drainage.   The symptoms will last about 2 days if she doesn't wear the CPAP anymore.  She states she has tried different CPAP mask and nose devices and she the symptoms with any of them.   She went to ENT and states was told to use her flonase nightly.  She states she tried to use flonase nightly and states didn't help.  She states she washes her CPAP daily with white vinegar and lets it dry out.  She also states she cleans her house regularly.  She has astelin nasal spray that she uses as needed for drainage.  The astelin and flonase she states does help with her seasonal allergy symptoms.   She takes claritin-d as needed when she has more sinus pressure or headaches.  Plain claritin does not help with allergy symptom control.  She has also taken xyzal and felt it was helpful.    She states she is allergic to cockroaches and dust that were positive on testing over 10 years ago.    She has symbicort that she uses about once a month for wheezing.   Review of systems: Review of Systems  Constitutional: Negative.   HENT:         See HPI  Eyes: Negative.   Respiratory: Negative.    Cardiovascular: Negative.   Gastrointestinal:  Negative.   Musculoskeletal: Negative.   Skin: Negative.   Neurological: Negative.    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   CHEST PAIN-UNSPECIFIED 05/19/2010   Qualifier: Diagnosis of  By: Aundra Dubin, MD, [place]     Chronic fatigue 06/08/2016   Colon polyps 05/2014   adenomatous, colo q 5y   Conjunctiva disorder 08/06/2018   CONSTIPATION, CHRONIC    Cough 04/25/2016   Depression 09/03/2016   Ear canal dryness 01/23/2018   FATIGUE 06/24/2008   Qualifier: Diagnosis of  By: Wynona Luna    GERD    Herpes zoster without complication 0/53/9767   Hot flashes    HYPERLIPIDEMIA    HYPERTENSION    Hypokalemia 12/10/2014   Lactose intolerance    LRTI (lower respiratory tract infection) 09/05/2017   LUQ cramping 04/21/2017   Migraine headache  MIGRAINES, HX OF 09/18/2007   Qualifier: Diagnosis of  By: Danelle Earthly CMA, Darlene     MS (multiple sclerosis) ([place]) 11/03/2015   Multiple sclerosis ([place])    Muscle strain 10/21/2016   OBESITY, TRUNCAL    OTHER SYMPTOMS INVOLVING DIGESTIVE SYSTEM OTHER 12/14/2009   Qualifier: Diagnosis of  By: Asa Lente MD, Valerie A    Otitis, externa, infective 05/24/2016   PERSONAL HX COLONIC POLYPS 04/21/2009   Qualifier: Diagnosis of  By: Shane Crutch, Amy S    SINUSITIS 01/14/2010   Qualifier: Diagnosis of  By: Asa Lente MD, Jannifer Rodney    Sleep apnea    Upper respiratory tract infection 09/23/2016   UTI 08/19/2010   Qualifier: Diagnosis of  By: Marca Ancona RMA, Lucy     Wheezing 09/10/2017    Past surgical  history: Past Surgical History:  Procedure Laterality Date   BREAST BIOPSY Right    [place]    COLONOSCOPY     MASTECTOMY Right    PORT-A-CATH REMOVAL Left 05/13/2015   Procedure: REMOVAL PORT-A-CATH;  Surgeon: [name], MD;  Location: [facility];  Service: General;  Laterality: Left;   PORTACATH PLACEMENT Left 11/06/2014   Procedure: INSERTION PORT-A-CATH;  Surgeon: [name], MD;  Location: [facility];  Service: General;  Laterality: Left;   SIMPLE MASTECTOMY WITH AXILLARY SENTINEL NODE BIOPSY Right 05/13/2015   Procedure: RIGHT TOTAL  MASTECTOMY WITH RIGHT  AXILLARY SENTINEL NODE BIOPSY;  Surgeon: [name], MD;  Location: [facility];  Service: General;  Laterality: Right;   TUBAL LIGATION      Family history:  Family History  Problem Relation Age of Onset   Coronary artery disease Mother    Heart disease Mother    Diabetes Mother    Hypertension Mother    Stroke Mother    Alcohol abuse Father    COPD Maternal Grandmother    Hypertension Other    Hyperlipidemia Other    Colon cancer Neg Hx    Rectal cancer Neg Hx    Stomach cancer Neg Hx    Esophageal cancer Neg Hx     Social history: She lives in an apartment with carpeting with electric heating and central cooling.  There is a fish in the home.  There is no concern for water damage, mildew or roaches in the home.  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  She denies a smoking history.   Medication List: Current Outpatient Medications  Medication Sig Dispense Refill   ALPRAZolam (XANAX) 0.5 MG tablet Take 1 tablet (0.5 mg total) by mouth 2 (two) times daily as needed for anxiety or sleep. 30 tablet 0   azelastine (ASTELIN) 0.1 % nasal spray azelastine 137 mcg (0.1 %) nasal spray aerosol 30 mL 3   b complex vitamins tablet Take 1 tablet by mouth daily.     budesonide-formoterol (SYMBICORT) 160-4.5 MCG/ACT inhaler Inhale 2 puffs into the  lungs 2 (two) times daily. 10.2 g 5   ferrous gluconate (FERGON) 324 MG tablet Take 1 tablet (324 mg total) by mouth 2 (two) times daily with a meal. (Patient taking differently: Take 324 mg by mouth 3 (three) times a week.) 180 tablet 3   fluticasone (FLONASE) 50 MCG/ACT nasal spray SHAKE LIQUID AND USE 2 SPRAYS IN EACH NOSTRIL DAILY 16 g 6   gabapentin (NEURONTIN) 100 MG capsule Take 3 capsules (300 mg total) by mouth 3 (three) times daily. 810 capsule 1  hydrochlorothiazide (HYDRODIURIL) 25 MG tablet TAKE 1 TABLET EVERY DAY 90 tablet 1   KLOR-CON M10 10 MEQ tablet TAKE 4 TABLETS BY MOUTH EVERY DAY 120 tablet 1   LINZESS 145 MCG CAPS capsule TAKE 1 CAPSULE BY MOUTH DAILY BEFORE BREAKFAST. 90 capsule 1   loratadine (CLARITIN) 10 MG tablet Take 10 mg by mouth daily.     losartan (COZAAR) 50 MG tablet TAKE 1 TABLET EVERY DAY 90 tablet 1   metoprolol tartrate (LOPRESSOR) 50 MG tablet TAKE 1 TABLET TWICE DAILY 180 tablet 1   pantoprazole (PROTONIX) 40 MG tablet Take 1 tablet (40 mg total) by mouth daily. 90 tablet 1   PENNSAID 2 % SOLN APPLY 2 PUMPS (2 GRAMS) TO AFFECTED AREA TOPICALLY TWICE DAILY AS DIRECTED 112 g 3   potassium chloride (KLOR-CON) 10 MEQ tablet TAKE 4 TABLETS(40 MEQ) BY MOUTH DAILY 120 tablet 5   rivaroxaban (XARELTO) 20 MG TABS tablet TAKE 1 TABLET(20 MG) BY MOUTH DAILY WITH SUPPER 90 tablet 1   Tart Cherry 1200 MG CAPS Take by mouth.     TURMERIC PO Take by mouth daily.     valACYclovir (VALTREX) 500 MG tablet Take 500 mg by mouth 2 (two) times daily.     VITAMIN D PO Take 2,000 Units by mouth daily.     BD PEN NEEDLE MICRO U/F 32G X 6 MM MISC Inject into the skin. (Patient not taking: Reported on 03/17/2021)     chlorpheniramine-HYDROcodone (TUSSIONEX PENNKINETIC ER) 10-8 MG/5ML SUER Take 5 mLs by mouth every 12 (twelve) hours as needed for cough. (Patient not taking: Reported on 03/17/2021) 115 mL 0   COPAXONE 40 MG/ML SOSY INJECT ONE SYRINGE SUBCUTANEOUSLY THREE TIMES PER WEEK  AT LEAST 48 HOURS APART. ALLOW SYRINGE TO WARM TO ROOM TEMPERATURE FOR 20 MINUTES. REFRIGERATE. (Patient not taking: Reported on 03/17/2021) 36 mL 2   ketoconazole (NIZORAL) 2 % shampoo Apply topically. (Patient not taking: Reported on 03/17/2021)     No current facility-administered medications for this visit.    Known medication allergies: Allergies  Allergen Reactions   Ace Inhibitors Anaphylaxis     Angioedema (tongue swelling)   Clarithromycin Anaphylaxis    Throat swells   Contrave [Naltrexone-Bupropion Hcl Er]     Chest and sob   Levaquin [Levofloxacin] Other (See Comments)    Tendon pain - shoulder and calf     Physical examination: Blood pressure 132/74, pulse 75, temperature 97.7 F (36.5 C), temperature source Temporal, resp. rate 18, height 5\' 3"  (1.6 m), weight 218 lb 12.8 oz (99.2 kg), last menstrual period 11/01/2012, SpO2 95 %.  General: Alert, interactive, in no acute distress. HEENT: PERRLA, TMs pearly gray, turbinates minimally edematous without discharge, post-pharynx non erythematous. Neck: Supple without lymphadenopathy. Lungs: Clear to auscultation without wheezing, rhonchi or rales. {no increased work of breathing. CV: Normal S1, S2 without murmurs. Abdomen: Nondistended, nontender. Skin: Warm and dry, without lesions or rashes. Extremities:  No clubbing, cyanosis or edema. Neuro:   Grossly intact.  Diagnositics/Labs:  Allergy testing: Environmental allergy skin prick testing is positive to both dust mites, cat and cockroach. Allergy testing results were read and interpreted by provider, documented by clinical staff.   Assessment and plan: Rhinitis, allergic  -environmental allergy testing is positive to dust mite, cat and cockroach -allergen avoidance measures discussed/handouts provided -use nasal Atrovent 2 sprays each nostril prior to putting on CPAP at night and have with you during the day for as needed use for nasal drainage  or congestion.  Can  be use up to 4 times a day if needed -can continue use of your Astelin and Flonase as needed for nasal drainage and congestion respectively for your allergy symptom control -for general allergy symptom relief recommend use of long-acting antihistamine like Allegra or Xyzal -reserve Claritin-D for when you have sinus pressure/pain/headache symptoms  Wheezing -can use symbicort 2 puffs twice a day as needed for wheeze.    Follow-up in 3 months or sooner if needed  I appreciate the opportunity to take part in Ary's care. Please do not hesitate to contact me with questions.  Sincerely,   Prudy Feeler, MD Allergy/Immunology [facility]

## 2021-03-22 ENCOUNTER — Other Ambulatory Visit: Payer: Self-pay | Admitting: Internal Medicine

## 2021-04-04 ENCOUNTER — Encounter: Payer: Self-pay | Admitting: Internal Medicine

## 2021-04-11 ENCOUNTER — Telehealth: Payer: Self-pay | Admitting: Cardiology

## 2021-04-11 NOTE — Telephone Encounter (Signed)
Patient states she has a question about her CPAP.

## 2021-04-12 ENCOUNTER — Encounter: Payer: Self-pay | Admitting: Internal Medicine

## 2021-04-12 NOTE — Telephone Encounter (Signed)
Patient is still having trouble sleeping with her cpap machine. She has had an appointment with the ENT and allergist doctor and she is still getting upper respiratory infections. She is using 4 nasal sprays also. She cleans her unit daily with white vinegar and water when she uses it. Please advise if she needs to purchase a cleaning machine or something else that can help.

## 2021-04-14 NOTE — Telephone Encounter (Signed)
Gave patient recommended directions made by Dr. Radford Pax and  understanding was verbalized.

## 2021-04-17 NOTE — Progress Notes (Signed)
Subjective:    Patient ID: [name] [name], female    DOB: Oct 13, 1960, 60 y.o.   MRN: [id number]  HPI The patient is here for an acute visit.   Insect bite - she was bit 7/26 - last Tuesday.  On her back.  It itches like crazy.  The other day it had a bump - ? Pus in it.  At that time also had some increased redness around it.  She was concerned about a bacterial infection at that time.  She denies any fevers or chills.  The redness has improved.  It is still very itchy.  She is concerned about still having an infection.  She uses oral Benadryl and topical Benadryl and gets some temporary relief.   Medications and allergies reviewed with patient and updated if appropriate.  Patient Active Problem List   Diagnosis Date Noted   Rotator cuff arthropathy of right shoulder 11/16/2020   OSA (obstructive sleep apnea) 10/08/2020   Disorder of SI (sacroiliac) joint 07/28/2020   Atypical chest pain 12/22/2019   AC (acromioclavicular) arthritis 10/09/2019   Acute pain of right shoulder 09/04/2019   Atrial flutter ([place]) 02/28/2019   RLS (restless legs syndrome) 11/21/2018   Sleep difficulties 08/06/2018   Trigger finger 01/23/2018   Meralgia paraesthetica, right 11/05/2017   Iron deficiency 10/09/2017   Vitamin D deficiency 06/15/2017   Lumbar radiculopathy 04/21/2017   Trigger index finger of left hand 04/21/2017   Morbid obesity ([place]) 04/21/2017   Acute non-recurrent maxillary sinusitis 01/08/2017   Left hip pain 10/31/2016   Pes planus 10/31/2016   Posterior tibialis muscle dysfunction 10/31/2016   Breast pain, right 08/24/2016   Palpitation 04/04/2016   Hand arthritis 04/04/2016   Malignant neoplasm of upper-outer quadrant of right breast in female, 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[place]) 11/03/2015   S/P mastectomy 05/13/2015   Chemotherapy-induced neuropathy ([place]) 04/21/2015   Genetic testing 12/11/2014   Breast cancer of upper-outer quadrant of right female breast ([place]) 10/23/2014    Allergic rhinitis 11/29/2009   KELOID 11/05/2009   CONSTIPATION, CHRONIC [id number]   METABOLIC SYNDROME X 99991111   GERD 01/07/2008   Essential hypertension 09/20/2007   Multiple sclerosis ([place]) 09/18/2007    Current Outpatient Medications on File Prior to Visit  Medication Sig Dispense Refill   ALPRAZolam (XANAX) 0.5 MG tablet Take 1 tablet (0.5 mg total) by mouth 2 (two) times daily as needed for anxiety or sleep. 30 tablet 0   azelastine (ASTELIN) 0.1 % nasal spray azelastine 137 mcg (0.1 %) nasal spray aerosol 30 mL 3   b complex vitamins tablet Take 1 tablet by mouth daily.     BD PEN NEEDLE MICRO U/F 32G X 6 MM MISC Inject into the skin.     budesonide-formoterol (SYMBICORT) 160-4.5 MCG/ACT inhaler Inhale 2 puffs into the lungs 2 (two) times daily. 10.2 g 5   COPAXONE 40 MG/ML SOSY INJECT ONE SYRINGE SUBCUTANEOUSLY THREE TIMES PER WEEK AT LEAST 48 HOURS APART. ALLOW SYRINGE TO WARM TO ROOM TEMPERATURE FOR 20 MINUTES. REFRIGERATE. 36 mL 2   ferrous gluconate (FERGON) 324 MG tablet Take 1 tablet (324 mg total) by mouth 2 (two) times daily with a meal. (Patient taking differently: Take 324 mg by mouth 3 (three) times a week.) 180 tablet 3   fluticasone (FLONASE) 50 MCG/ACT nasal spray SHAKE LIQUID AND USE 2 SPRAYS IN EACH NOSTRIL DAILY 16 g 6   gabapentin (NEURONTIN) 100 MG capsule Take 3 capsules (300 mg total) by mouth 3 (  three) times daily. 810 capsule 1   hydrochlorothiazide (HYDRODIURIL) 25 MG tablet TAKE 1 TABLET EVERY DAY 90 tablet 1   ipratropium (ATROVENT) 0.06 % nasal spray Place 2 sprays into both nostrils 4 (four) times daily as needed (nasal congestion/drainage). 15 mL 5   KLOR-CON M10 10 MEQ tablet TAKE 4 TABLETS BY MOUTH EVERY DAY 120 tablet 1   LINZESS 145 MCG CAPS capsule TAKE 1 CAPSULE BY MOUTH DAILY BEFORE BREAKFAST. 90 capsule 1   loratadine (CLARITIN) 10 MG tablet Take 10 mg by mouth daily.     losartan (COZAAR) 50 MG tablet TAKE 1 TABLET EVERY DAY 90 tablet 1    metoprolol tartrate (LOPRESSOR) 50 MG tablet TAKE 1 TABLET TWICE DAILY 180 tablet 1   pantoprazole (PROTONIX) 40 MG tablet Take 1 tablet (40 mg total) by mouth daily. 90 tablet 1   PENNSAID 2 % SOLN APPLY 2 PUMPS (2 GRAMS) TO AFFECTED AREA TOPICALLY TWICE DAILY AS DIRECTED 112 g 3   potassium chloride (KLOR-CON) 10 MEQ tablet TAKE 4 TABLETS(40 MEQ) BY MOUTH DAILY 120 tablet 5   rivaroxaban (XARELTO) 20 MG TABS tablet TAKE 1 TABLET(20 MG) BY MOUTH DAILY WITH SUPPER 90 tablet 1   Tart Cherry 1200 MG CAPS Take by mouth.     TURMERIC PO Take by mouth daily.     valACYclovir (VALTREX) 500 MG tablet Take 500 mg by mouth 2 (two) times daily.     VITAMIN D PO Take 2,000 Units by mouth daily.     No current facility-administered medications on file prior to visit.    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   CHEST PAIN-UNSPECIFIED 05/19/2010   Qualifier: Diagnosis of  By: Aundra Dubin, MD, [place]     Chronic fatigue 06/08/2016   Colon polyps 05/2014   adenomatous, colo q 5y   Conjunctiva disorder 08/06/2018   CONSTIPATION, CHRONIC    Cough 04/25/2016   Depression 09/03/2016   Ear canal dryness 01/23/2018   FATIGUE 06/24/2008   Qualifier: Diagnosis of  By: Wynona Luna    GERD    Herpes zoster without complication 99991111   Hot flashes    HYPERLIPIDEMIA    HYPERTENSION    Hypokalemia 12/10/2014   Lactose intolerance    LRTI (lower respiratory tract infection) 09/05/2017   LUQ cramping 04/21/2017   Migraine headache    MIGRAINES, HX OF 09/18/2007   Qualifier: Diagnosis of  By: Danelle Earthly CMA, Darlene     MS  (multiple sclerosis) ([place]) 11/03/2015   Multiple sclerosis ([place])    Muscle strain 10/21/2016   OBESITY, TRUNCAL    OTHER SYMPTOMS INVOLVING DIGESTIVE SYSTEM OTHER 12/14/2009   Qualifier: Diagnosis of  By: Asa Lente MD, Valerie A    Otitis, externa, infective 05/24/2016   PERSONAL HX COLONIC POLYPS 04/21/2009   Qualifier: Diagnosis of  By: Shane Crutch, Amy S    SINUSITIS 01/14/2010   Qualifier: Diagnosis of  By: Asa Lente MD, Jannifer Rodney    Sleep apnea    Upper respiratory tract infection 09/23/2016   UTI 08/19/2010   Qualifier: Diagnosis of  By:  Brand RMA, Lucy     Wheezing 09/10/2017    Past Surgical History:  Procedure Laterality Date   BREAST BIOPSY Right    [place]    COLONOSCOPY     MASTECTOMY Right    PORT-A-CATH REMOVAL Left 05/13/2015   Procedure: REMOVAL PORT-A-CATH;  Surgeon: [name], MD;  Location: [facility];  Service: General;  Laterality: Left;   PORTACATH PLACEMENT Left 11/06/2014   Procedure: INSERTION PORT-A-CATH;  Surgeon: [name], MD;  Location: [facility];  Service: General;  Laterality: Left;   SIMPLE MASTECTOMY WITH AXILLARY SENTINEL NODE BIOPSY Right 05/13/2015   Procedure: RIGHT TOTAL  MASTECTOMY WITH RIGHT  AXILLARY SENTINEL NODE BIOPSY;  Surgeon: [name], MD;  Location: [facility];  Service: General;  Laterality: Right;   TUBAL LIGATION      Social History   Socioeconomic History   Marital status: Single    Spouse name: Not on file   Number of children: 2   Years of education: 12   Highest education level: Not on file  Occupational History   Occupation: Solicitor: [employer]  Tobacco Use   Smoking status: Never   Smokeless tobacco: Never  Vaping Use   Vaping Use: Never used  Substance and Sexual Activity   Alcohol use: No    Alcohol/week: 0.0 standard drinks   Drug use: No   Sexual activity: Not on file  Other Topics Concern   Not on file  Social History Narrative   Patient is a  Programmer, 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    Patient is single and lives alone.    Patient is right handed.    Social Determinants of Health   Financial Resource Strain: Not on file  Food Insecurity: Not on file  Transportation Needs: Not on file  Physical Activity: Not on file  Stress: Not on file  Social Connections: Not on file    Family History  Problem Relation Age of Onset   Coronary artery disease Mother    Heart disease Mother    Diabetes Mother    Hypertension Mother    Stroke Mother    Alcohol abuse Father    COPD Maternal Grandmother    Hypertension Other    Hyperlipidemia Other    Colon cancer Neg Hx    Rectal cancer Neg Hx    Stomach cancer Neg Hx    Esophageal cancer Neg Hx     Review of Systems     Objective:   Vitals:   04/18/21 1409  BP: 136/76  Pulse: 86  Temp: 98.4 F (36.9 C)  SpO2: 97%   BP Readings from Last 3 Encounters:  04/18/21 136/76  03/17/21 132/74  03/15/21 130/80   Wt Readings from Last 3 Encounters:  04/18/21 216 lb 3.2 oz (98.1 kg)  03/17/21 218 lb 12.8 oz (99.2 kg)  03/15/21 215 lb (97.5 kg)   Body mass index is 38.3 kg/m.   Physical Exam Constitutional:      General: She is not in acute distress.    Appearance: Normal appearance. She is not ill-appearing.  HENT:     Head: Normocephalic and atraumatic.  Skin:    General: Skin is warm and dry.     Findings: Lesion (Mid upper back central pinpoint hole that looks like an insect bite with surrounding hyperpigmentation the size of an M&M, minimally tender, no fluctuance, no induration) present.  Neurological:  Mental Status: She is alert.           Assessment & Plan:    See Problem List for Assessment and Plan of chronic medical problems.    This visit occurred during the SARS-CoV-2 public health emergency.  Safety protocols were in place, including screening questions prior to the visit, additional usage of  staff PPE, and extensive cleaning of exam room while observing appropriate contact time as indicated for disinfecting solutions.

## 2021-04-18 ENCOUNTER — Other Ambulatory Visit: Payer: Self-pay

## 2021-04-18 ENCOUNTER — Encounter: Payer: Self-pay | Admitting: Internal Medicine

## 2021-04-18 ENCOUNTER — Ambulatory Visit: Payer: BC Managed Care – PPO | Admitting: Internal Medicine

## 2021-04-18 VITALS — BP 136/76 | HR 86 | Temp 98.4°F | Ht 63.0 in | Wt 216.2 lb

## 2021-04-18 DIAGNOSIS — W57XXXA Bitten or stung by nonvenomous insect and other nonvenomous arthropods, initial encounter: Secondary | ICD-10-CM | POA: Diagnosis not present

## 2021-04-18 DIAGNOSIS — S20469A Insect bite (nonvenomous) of unspecified back wall of thorax, initial encounter: Secondary | ICD-10-CM | POA: Insufficient documentation

## 2021-04-18 MED ORDER — TRIAMCINOLONE ACETONIDE 0.1 % EX CREA: 1.0000 "application " | TOPICAL_CREAM | Freq: Two times a day (BID) | CUTANEOUS | 0 refills | Status: DC

## 2021-04-18 NOTE — Patient Instructions (Addendum)
   Medications changes include :   steroid cream twice daily for the bug bite  Your prescription(s) have been submitted to your pharmacy. Please take as directed and contact our office if you believe you are having problem(s) with the medication(s).

## 2021-04-18 NOTE — Assessment & Plan Note (Signed)
Acute Insect bite of upper back-she does know that she was bitten when she got bit-unsure what it was Previous pictures do look like there may have been a mild infection, but much improved compared to then No need for antibiotics Her biggest complaint is the itching Can continue oral Benadryl at night and topical Benadryl Will try triamcinolone 0.1% cream twice daily to see if that helps more with the itching Call if no improvement

## 2021-04-23 ENCOUNTER — Other Ambulatory Visit: Payer: Self-pay | Admitting: Internal Medicine

## 2021-05-10 ENCOUNTER — Encounter: Payer: Self-pay | Admitting: Internal Medicine

## 2021-05-22 ENCOUNTER — Other Ambulatory Visit: Payer: Self-pay | Admitting: Internal Medicine

## 2021-06-10 ENCOUNTER — Telehealth: Payer: BC Managed Care – PPO | Admitting: Physician Assistant

## 2021-06-10 ENCOUNTER — Other Ambulatory Visit: Payer: Self-pay | Admitting: Internal Medicine

## 2021-06-10 DIAGNOSIS — B9689 Other specified bacterial agents as the cause of diseases classified elsewhere: Secondary | ICD-10-CM | POA: Diagnosis not present

## 2021-06-10 DIAGNOSIS — T3695XA Adverse effect of unspecified systemic antibiotic, initial encounter: Secondary | ICD-10-CM | POA: Diagnosis not present

## 2021-06-10 DIAGNOSIS — J019 Acute sinusitis, unspecified: Secondary | ICD-10-CM | POA: Diagnosis not present

## 2021-06-10 DIAGNOSIS — B379 Candidiasis, unspecified: Secondary | ICD-10-CM | POA: Diagnosis not present

## 2021-06-10 MED ORDER — AZITHROMYCIN 250 MG PO TABS: ORAL_TABLET | ORAL | 0 refills | Status: DC

## 2021-06-10 MED ORDER — FLUCONAZOLE 150 MG PO TABS: 150.0000 mg | ORAL_TABLET | Freq: Once | ORAL | 0 refills | Status: AC

## 2021-06-10 NOTE — Patient Instructions (Signed)
[name], thank you for joining Mar Daring, PA-C for today's virtual visit.  While this provider is not your primary care provider (PCP), if your PCP is located in our provider database this encounter information will be shared with them immediately following your visit.  Consent: (Patient) [name] [name] provided verbal consent for this virtual visit at the beginning of the encounter.  Current Medications:  Current Outpatient Medications:    azithromycin (ZITHROMAX) 250 MG tablet, Take 2 tablets PO on day one, and one tablet PO daily thereafter until completed., Disp: 6 tablet, Rfl: 0   fluconazole (DIFLUCAN) 150 MG tablet, Take 1 tablet (150 mg total) by mouth once for 1 dose. May repeat in 48-72 hrs if needed, Disp: 2 tablet, Rfl: 0   ALPRAZolam (XANAX) 0.5 MG tablet, Take 1 tablet (0.5 mg total) by mouth 2 (two) times daily as needed for anxiety or sleep., Disp: 30 tablet, Rfl: 0   azelastine (ASTELIN) 0.1 % nasal spray, azelastine 137 mcg (0.1 %) nasal spray aerosol, Disp: 30 mL, Rfl: 3   b complex vitamins tablet, Take 1 tablet by mouth daily., Disp: , Rfl:    BD PEN NEEDLE MICRO U/F 32G X 6 MM MISC, Inject into the skin., Disp: , Rfl:    budesonide-formoterol (SYMBICORT) 160-4.5 MCG/ACT inhaler, Inhale 2 puffs into the lungs 2 (two) times daily., Disp: 10.2 g, Rfl: 5   COPAXONE 40 MG/ML SOSY, INJECT ONE SYRINGE SUBCUTANEOUSLY THREE TIMES PER WEEK AT LEAST 48 HOURS APART. ALLOW SYRINGE TO WARM TO ROOM TEMPERATURE FOR 20 MINUTES. REFRIGERATE., Disp: 36 mL, Rfl: 2   ferrous gluconate (FERGON) 324 MG tablet, Take 1 tablet (324 mg total) by mouth 2 (two) times daily with a meal. (Patient taking differently: Take 324 mg by mouth 3 (three) times a week.), Disp: 180 tablet, Rfl: 3   fluticasone (FLONASE) 50 MCG/ACT nasal spray, SHAKE LIQUID AND USE 2 SPRAYS IN EACH NOSTRIL DAILY, Disp: 16 g, Rfl: 6   gabapentin (NEURONTIN) 100 MG capsule, Take 3 capsules (300 mg total) by  mouth 3 (three) times daily., Disp: 810 capsule, Rfl: 1   hydrochlorothiazide (HYDRODIURIL) 25 MG tablet, TAKE 1 TABLET BY MOUTH EVERY DAY, Disp: 90 tablet, Rfl: 1   ipratropium (ATROVENT) 0.06 % nasal spray, Place 2 sprays into both nostrils 4 (four) times daily as needed (nasal congestion/drainage)., Disp: 15 mL, Rfl: 5   KLOR-CON M10 10 MEQ tablet, TAKE 4 TABLETS BY MOUTH EVERY DAY, Disp: 120 tablet, Rfl: 1   LINZESS 145 MCG CAPS capsule, TAKE 1 CAPSULE BY MOUTH DAILY BEFORE BREAKFAST., Disp: 90 capsule, Rfl: 1   loratadine (CLARITIN) 10 MG tablet, Take 10 mg by mouth daily., Disp: , Rfl:    losartan (COZAAR) 50 MG tablet, TAKE 1 TABLET EVERY DAY, Disp: 90 tablet, Rfl: 1   metoprolol tartrate (LOPRESSOR) 50 MG tablet, TAKE 1 TABLET TWICE DAILY, Disp: 180 tablet, Rfl: 1   pantoprazole (PROTONIX) 40 MG tablet, Take 1 tablet (40 mg total) by mouth daily., Disp: 90 tablet, Rfl: 1   PENNSAID 2 % SOLN, APPLY 2 PUMPS (2 GRAMS) TO AFFECTED AREA TOPICALLY TWICE DAILY AS DIRECTED, Disp: 112 g, Rfl: 3   potassium chloride (KLOR-CON) 10 MEQ tablet, TAKE 4 TABLETS(40 MEQ) BY MOUTH DAILY, Disp: 120 tablet, Rfl: 5   rivaroxaban (XARELTO) 20 MG TABS tablet, TAKE 1 TABLET(20 MG) BY MOUTH DAILY WITH SUPPER, Disp: 90 tablet, Rfl: 1   Tart Cherry 1200 MG CAPS, Take by mouth., Disp: , Rfl:  triamcinolone cream (KENALOG) 0.1 %, Apply 1 application topically 2 (two) times daily., Disp: 30 g, Rfl: 0   TURMERIC PO, Take by mouth daily., Disp: , Rfl:    valACYclovir (VALTREX) 500 MG tablet, Take 500 mg by mouth 2 (two) times daily., Disp: , Rfl:    VITAMIN D PO, Take 2,000 Units by mouth daily., Disp: , Rfl:    Medications ordered in this encounter:  Meds ordered this encounter  Medications   azithromycin (ZITHROMAX) 250 MG tablet    Sig: Take 2 tablets PO on day one, and one tablet PO daily thereafter until completed.    Dispense:  6 tablet    Refill:  0    Order Specific Question:   Supervising Provider     Answer:   MILLER, BRIAN [[id number]]   fluconazole (DIFLUCAN) 150 MG tablet    Sig: Take 1 tablet (150 mg total) by mouth once for 1 dose. May repeat in 48-72 hrs if needed    Dispense:  2 tablet    Refill:  0    Order Specific Question:   Supervising Provider    Answer:   Sabra Heck, [place]     *If you need refills on other medications prior to your next appointment, please contact your pharmacy*  Follow-Up: Call back or seek an in-person evaluation if the symptoms worsen or if the condition fails to improve as anticipated.  Other Instructions Sinusitis, Adult Sinusitis is inflammation of your sinuses. Sinuses are hollow spaces in the bones around your face. Your sinuses are located: Around your eyes. In the middle of your forehead. Behind your nose. In your cheekbones. Mucus normally drains out of your sinuses. When your nasal tissues become inflamed or swollen, mucus can become trapped or blocked. This allows bacteria, viruses, and fungi to grow, which leads to infection. Most infections of the sinuses are caused by a virus. Sinusitis can develop quickly. It can last for up to 4 weeks (acute) or for more than 12 weeks (chronic). Sinusitis often develops after a cold. What are the causes? This condition is caused by anything that creates swelling in the sinuses or stops mucus from draining. This includes: Allergies. Asthma. Infection from bacteria or viruses. Deformities or blockages in your nose or sinuses. Abnormal growths in the nose (nasal polyps). Pollutants, such as chemicals or irritants in the air. Infection from fungi (rare). What increases the risk? You are more likely to develop this condition if you: Have a weak body defense system (immune system). Do a lot of swimming or diving. Overuse nasal sprays. Smoke. What are the signs or symptoms? The main symptoms of this condition are pain and a feeling of pressure around the affected sinuses. Other symptoms  include: Stuffy nose or congestion. Thick drainage from your nose. Swelling and warmth over the affected sinuses. Headache. Upper toothache. A cough that may get worse at night. Extra mucus that collects in the throat or the back of the nose (postnasal drip). Decreased sense of smell and taste. Fatigue. A fever. Sore throat. Bad breath. How is this diagnosed? This condition is diagnosed based on: Your symptoms. Your medical history. A physical exam. Tests to find out if your condition is acute or chronic. This may include: Checking your nose for nasal polyps. Viewing your sinuses using a device that has a light (endoscope). Testing for allergies or bacteria. Imaging tests, such as an MRI or CT scan. In rare cases, a bone biopsy may be done to rule out more  serious types of fungal sinus disease. How is this treated? Treatment for sinusitis depends on the cause and whether your condition is chronic or acute. If caused by a virus, your symptoms should go away on their own within 10 days. You may be given medicines to relieve symptoms. They include: Medicines that shrink swollen nasal passages (topical intranasal decongestants). Medicines that treat allergies (antihistamines). A spray that eases inflammation of the nostrils (topical intranasal corticosteroids). Rinses that help get rid of thick mucus in your nose (nasal saline washes). If caused by bacteria, your health care provider may recommend waiting to see if your symptoms improve. Most bacterial infections will get better without antibiotic medicine. You may be given antibiotics if you have: A severe infection. A weak immune system. If caused by narrow nasal passages or nasal polyps, you may need to have surgery. Follow these instructions at home: Medicines Take, use, or apply over-the-counter and prescription medicines only as told by your health care provider. These may include nasal sprays. If you were prescribed an  antibiotic medicine, take it as told by your health care provider. Do not stop taking the antibiotic even if you start to feel better. Hydrate and humidify  Drink enough fluid to keep your urine pale yellow. Staying hydrated will help to thin your mucus. Use a cool mist humidifier to keep the humidity level in your home above 50%. Inhale steam for 10-15 minutes, 3-4 times a day, or as told by your health care provider. You can do this in the bathroom while a hot shower is running. Limit your exposure to cool or dry air. Rest Rest as much as possible. Sleep with your head raised (elevated). Make sure you get enough sleep each night. General instructions  Apply a warm, moist washcloth to your face 3-4 times a day or as told by your health care provider. This will help with discomfort. Wash your hands often with soap and water to reduce your exposure to germs. If soap and water are not available, use hand sanitizer. Do not smoke. Avoid being around people who are smoking (secondhand smoke). Keep all follow-up visits as told by your health care provider. This is important. Contact a health care provider if: You have a fever. Your symptoms get worse. Your symptoms do not improve within 10 days. Get help right away if: You have a severe headache. You have persistent vomiting. You have severe pain or swelling around your face or eyes. You have vision problems. You develop confusion. Your neck is stiff. You have trouble breathing. Summary Sinusitis is soreness and inflammation of your sinuses. Sinuses are hollow spaces in the bones around your face. This condition is caused by nasal tissues that become inflamed or swollen. The swelling traps or blocks the flow of mucus. This allows bacteria, viruses, and fungi to grow, which leads to infection. If you were prescribed an antibiotic medicine, take it as told by your health care provider. Do not stop taking the antibiotic even if you start to  feel better. Keep all follow-up visits as told by your health care provider. This is important. This information is not intended to replace advice given to you by your health care provider. Make sure you discuss any questions you have with your health care provider. Document Revised: 02/04/2018 Document Reviewed: 02/04/2018 Elsevier Patient Education  2022 [employer].    If you have been instructed to have an in-person evaluation today at a local Urgent Care facility, please use the link below.  It will take you to a list of all of our available [facility], including address, phone number and hours of operation. Please do not delay care.  [facility]  If you or a family member do not have a primary care provider, use the link below to schedule a visit and establish care. When you choose a [facility] primary care physician or advanced practice provider, you gain a long-term partner in health. Find a Primary Care Provider  Learn more about [facility]'s in-office and virtual care options: [facility]

## 2021-06-10 NOTE — Progress Notes (Signed)
Virtual Visit Consent   [name] [name], you are scheduled for a virtual visit with a [facility] provider today.     Just as with appointments in the office, your consent must be obtained to participate.  Your consent will be active for this visit and any virtual visit you may have with one of our providers in the next 365 days.     If you have a MyChart account, a copy of this consent can be sent to you electronically.  All virtual visits are billed to your insurance company just like a traditional visit in the office.    As this is a virtual visit, video technology does not allow for your provider to perform a traditional examination.  This may limit your provider's ability to fully assess your condition.  If your provider identifies any concerns that need to be evaluated in person or the need to arrange testing (such as labs, EKG, etc.), we will make arrangements to do so.     Although advances in technology are sophisticated, we cannot ensure that it will always work on either your end or our end.  If the connection with a video visit is poor, the visit may have to be switched to a telephone visit.  With either a video or telephone visit, we are not always able to ensure that we have a secure connection.     I need to obtain your verbal consent now.   Are you willing to proceed with your visit today?    [name] [name] has provided verbal consent on 06/10/2021 for a virtual visit (video or telephone).   Mar Daring, PA-C   Date: 06/10/2021 11:29 AM   Virtual Visit via Video Note   I, Mar Daring, connected with  [name] [name]  ([id number], July 19, 1957) on 06/10/21 at 11:30 AM EDT by a video-enabled telemedicine application and verified that I am speaking with the correct person using two identifiers.  Location: Patient: Virtual Visit Location Patient: Home Provider: Virtual Visit Location Provider: Home Office   I discussed the limitations of evaluation and  management by telemedicine and the availability of in person appointments. The patient expressed understanding and agreed to proceed.    History of Present Illness: [name] [name] is a 60 y.o. who identifies as a female who was assigned female at birth, and is being seen today for possible sinusitis.  HPI: Sinusitis This is a new problem. The current episode started in the past 7 days. The problem has been gradually worsening since onset. There has been no fever. The pain is mild. Associated symptoms include congestion, coughing and sinus pressure. Pertinent negatives include no headaches, shortness of breath or sore throat. (Post nasal drainage) Past treatments include acetaminophen, saline sprays and spray decongestants. The treatment provided no relief.     Problems:  Patient Active Problem List   Diagnosis Date Noted   Insect bite of back wall of thorax 04/18/2021   Rotator cuff arthropathy of right shoulder 11/16/2020   OSA (obstructive sleep apnea) 10/08/2020   Disorder of SI (sacroiliac) joint 07/28/2020   Atypical chest pain 12/22/2019   AC (acromioclavicular) arthritis 10/09/2019   Acute pain of right shoulder 09/04/2019   Atrial flutter ([place]) 02/28/2019   RLS (restless legs syndrome) 11/21/2018   Sleep difficulties 08/06/2018   Trigger finger 01/23/2018   Meralgia paraesthetica, right 11/05/2017   Iron deficiency 10/09/2017   Vitamin D deficiency 06/15/2017   Lumbar radiculopathy 04/21/2017   Trigger index finger of left  hand 04/21/2017   Morbid obesity ([place]) 04/21/2017   Acute non-recurrent maxillary sinusitis 01/08/2017   Left hip pain 10/31/2016   Pes planus 10/31/2016   Posterior tibialis muscle dysfunction 10/31/2016   Breast pain, right 08/24/2016   Palpitation 04/04/2016   Hand arthritis 04/04/2016   Malignant neoplasm of upper-outer quadrant of right breast in female, 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[facility]) 11/03/2015   S/P mastectomy 05/13/2015    Chemotherapy-induced neuropathy ([place]) 04/21/2015   Genetic testing 12/11/2014   Breast cancer of upper-outer quadrant of right female breast ([place]) 10/23/2014   Allergic rhinitis 11/29/2009   KELOID 11/05/2009   CONSTIPATION, CHRONIC 09/81/1914   METABOLIC SYNDROME X 78/29/5621   GERD 01/07/2008   Essential hypertension 09/20/2007   Multiple sclerosis ([place]) 09/18/2007    Allergies:  Allergies  Allergen Reactions   Ace Inhibitors Anaphylaxis     Angioedema (tongue swelling)   Clarithromycin Anaphylaxis    Throat swells   Contrave [Naltrexone-Bupropion Hcl Er]     Chest and sob   Levaquin [Levofloxacin] Other (See Comments)    Tendon pain - shoulder and calf   Medications:  Current Outpatient Medications:    azithromycin (ZITHROMAX) 250 MG tablet, Take 2 tablets PO on day one, and one tablet PO daily thereafter until completed., Disp: 6 tablet, Rfl: 0   fluconazole (DIFLUCAN) 150 MG tablet, Take 1 tablet (150 mg total) by mouth once for 1 dose. May repeat in 48-72 hrs if needed, Disp: 2 tablet, Rfl: 0   ALPRAZolam (XANAX) 0.5 MG tablet, Take 1 tablet (0.5 mg total) by mouth 2 (two) times daily as needed for anxiety or sleep., Disp: 30 tablet, Rfl: 0   azelastine (ASTELIN) 0.1 % nasal spray, azelastine 137 mcg (0.1 %) nasal spray aerosol, Disp: 30 mL, Rfl: 3   b complex vitamins tablet, Take 1 tablet by mouth daily., Disp: , Rfl:    BD PEN NEEDLE MICRO U/F 32G X 6 MM MISC, Inject into the skin., Disp: , Rfl:    budesonide-formoterol (SYMBICORT) 160-4.5 MCG/ACT inhaler, Inhale 2 puffs into the lungs 2 (two) times daily., Disp: 10.2 g, Rfl: 5   COPAXONE 40 MG/ML SOSY, INJECT ONE SYRINGE SUBCUTANEOUSLY THREE TIMES PER WEEK AT LEAST 48 HOURS APART. ALLOW SYRINGE TO WARM TO ROOM TEMPERATURE FOR 20 MINUTES. REFRIGERATE., Disp: 36 mL, Rfl: 2   ferrous gluconate (FERGON) 324 MG tablet, Take 1 tablet (324 mg total) by mouth 2 (two) times daily with a meal. (Patient taking differently: Take 324 mg  by mouth 3 (three) times a week.), Disp: 180 tablet, Rfl: 3   fluticasone (FLONASE) 50 MCG/ACT nasal spray, SHAKE LIQUID AND USE 2 SPRAYS IN EACH NOSTRIL DAILY, Disp: 16 g, Rfl: 6   gabapentin (NEURONTIN) 100 MG capsule, Take 3 capsules (300 mg total) by mouth 3 (three) times daily., Disp: 810 capsule, Rfl: 1   hydrochlorothiazide (HYDRODIURIL) 25 MG tablet, TAKE 1 TABLET BY MOUTH EVERY DAY, Disp: 90 tablet, Rfl: 1   ipratropium (ATROVENT) 0.06 % nasal spray, Place 2 sprays into both nostrils 4 (four) times daily as needed (nasal congestion/drainage)., Disp: 15 mL, Rfl: 5   KLOR-CON M10 10 MEQ tablet, TAKE 4 TABLETS BY MOUTH EVERY DAY, Disp: 120 tablet, Rfl: 1   LINZESS 145 MCG CAPS capsule, TAKE 1 CAPSULE BY MOUTH DAILY BEFORE BREAKFAST., Disp: 90 capsule, Rfl: 1   loratadine (CLARITIN) 10 MG tablet, Take 10 mg by mouth daily., Disp: , Rfl:    losartan (COZAAR) 50 MG tablet, TAKE 1 TABLET  EVERY DAY, Disp: 90 tablet, Rfl: 1   metoprolol tartrate (LOPRESSOR) 50 MG tablet, TAKE 1 TABLET TWICE DAILY, Disp: 180 tablet, Rfl: 1   pantoprazole (PROTONIX) 40 MG tablet, Take 1 tablet (40 mg total) by mouth daily., Disp: 90 tablet, Rfl: 1   PENNSAID 2 % SOLN, APPLY 2 PUMPS (2 GRAMS) TO AFFECTED AREA TOPICALLY TWICE DAILY AS DIRECTED, Disp: 112 g, Rfl: 3   potassium chloride (KLOR-CON) 10 MEQ tablet, TAKE 4 TABLETS(40 MEQ) BY MOUTH DAILY, Disp: 120 tablet, Rfl: 5   rivaroxaban (XARELTO) 20 MG TABS tablet, TAKE 1 TABLET(20 MG) BY MOUTH DAILY WITH SUPPER, Disp: 90 tablet, Rfl: 1   Tart Cherry 1200 MG CAPS, Take by mouth., Disp: , Rfl:    triamcinolone cream (KENALOG) 0.1 %, Apply 1 application topically 2 (two) times daily., Disp: 30 g, Rfl: 0   TURMERIC PO, Take by mouth daily., Disp: , Rfl:    valACYclovir (VALTREX) 500 MG tablet, Take 500 mg by mouth 2 (two) times daily., Disp: , Rfl:    VITAMIN D PO, Take 2,000 Units by mouth daily., Disp: , Rfl:   Observations/Objective: Patient is well-developed,  well-nourished in no acute distress.  Resting comfortably at home.  Head is normocephalic, atraumatic.  No labored breathing.  Speech is clear and coherent with logical content.  Patient is alert and oriented at baseline.  Patient reports tenderness to palpation over bilateral maxillary sinuses  Assessment and Plan: 1. Acute bacterial sinusitis - azithromycin (ZITHROMAX) 250 MG tablet; Take 2 tablets PO on day one, and one tablet PO daily thereafter until completed.  Dispense: 6 tablet; Refill: 0  2. Antibiotic-induced yeast infection - fluconazole (DIFLUCAN) 150 MG tablet; Take 1 tablet (150 mg total) by mouth once for 1 dose. May repeat in 48-72 hrs if needed  Dispense: 2 tablet; Refill: 0  - Worsening symptoms that have not responded to OTC medications.  - Will give Zpak, patient tolerates and works well - Gets yeast infections with antibiotics. Diflucan given  - Continue allergy medications.  - Stay well hydrated and get plenty of rest.  - Call or seek in person evaluation if no symptom improvement or if symptoms worsen.  Follow Up Instructions: I discussed the assessment and treatment plan with the patient. The patient was provided an opportunity to ask questions and all were answered. The patient agreed with the plan and demonstrated an understanding of the instructions.  A copy of instructions were sent to the patient via MyChart unless otherwise noted below.   The patient was advised to call back or seek an in-person evaluation if the symptoms worsen or if the condition fails to improve as anticipated.  Time:  I spent 10 minutes with the patient via telehealth technology discussing the above problems/concerns.    Mar Daring, PA-C

## 2021-06-13 ENCOUNTER — Ambulatory Visit: Payer: BC Managed Care – PPO

## 2021-06-14 NOTE — Progress Notes (Signed)
[name] [name] [place] [address] [place] Phone: [phone] Subjective:   [name] [name], am serving as a scribe for Dr. Hulan Saas. This visit occurred during the SARS-CoV-2 public health emergency.  Safety protocols were in place, including screening questions prior to the visit, additional usage of staff PPE, and extensive cleaning of exam room while observing appropriate contact time as indicated for disinfecting solutions.   I'm seeing this patient by the request  of:  Binnie Rail, MD  CC: Shoulder pain follow-up  [id number]  03/15/2021 Stable at the moment.  Nearly 4 months.  Follow-up again in 2 to 3 months.  Patient known degenerative changes of the right shoulder.  Getting x-ray of the left shoulder just to make sure there is nothing significantly abnormal with patient's history of breast cancer.  Doing significantly better.  Has full range of motion of the left side.  At this point patient can follow-up with me as needed but patient would like to have an appointment in 3 months just in case.  Update 06/15/2021 [name] [name] is a 60 y.o. female coming in with complaint of L shoulder pain. Patient states she is doing well. Shoulder has good ROM and little pain. She has concerns about a bump on her left thumb.  Patient states that the lump has been there for nearly 2 to 3 years.  This would like to know what it is exactly.  He does not feel like it is getting any bigger.  Sometimes seems to be irritated a little bit but nothing crazy.  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   CHEST PAIN-UNSPECIFIED 05/19/2010   Qualifier: Diagnosis of  By: Aundra Dubin, MD, [place]     Chronic fatigue 06/08/2016   Colon polyps 05/2014   adenomatous, colo q 5y   Conjunctiva disorder 08/06/2018   CONSTIPATION, CHRONIC    Cough 04/25/2016   Depression 09/03/2016   Ear canal dryness 01/23/2018   FATIGUE 06/24/2008   Qualifier: Diagnosis of  By: Wynona Luna    GERD    Herpes zoster without complication 0/76/2263   Hot flashes    HYPERLIPIDEMIA    HYPERTENSION    Hypokalemia 12/10/2014   Lactose intolerance    LRTI (lower respiratory tract infection) 09/05/2017   LUQ cramping 04/21/2017   Migraine headache    MIGRAINES, HX OF 09/18/2007   Qualifier: Diagnosis of  By: Danelle Earthly CMA, Darlene     MS (multiple sclerosis) ([place]) 11/03/2015   Multiple sclerosis ([place])    Muscle strain 10/21/2016   OBESITY, TRUNCAL    OTHER SYMPTOMS INVOLVING DIGESTIVE SYSTEM OTHER 12/14/2009   Qualifier: Diagnosis of  By: Asa Lente MD, Valerie A    Otitis, externa, infective 05/24/2016   PERSONAL HX COLONIC POLYPS 04/21/2009   Qualifier: Diagnosis of  By: Shane Crutch, Amy S    SINUSITIS 01/14/2010   Qualifier: Diagnosis of  By: Asa Lente MD, Jannifer Rodney    Sleep apnea    Upper respiratory tract infection 09/23/2016  UTI 08/19/2010   Qualifier: Diagnosis of  By: Marca Ancona RMA, Lucy     Wheezing 09/10/2017   Past Surgical History:  Procedure Laterality Date   BREAST BIOPSY Right    [place]    COLONOSCOPY     MASTECTOMY Right    PORT-A-CATH REMOVAL Left 05/13/2015   Procedure: REMOVAL PORT-A-CATH;  Surgeon: [name], MD;  Location: [facility];  Service: General;  Laterality: Left;   PORTACATH PLACEMENT Left 11/06/2014   Procedure: INSERTION PORT-A-CATH;  Surgeon: [name], MD;  Location: [facility];  Service: General;  Laterality: Left;   SIMPLE MASTECTOMY WITH AXILLARY SENTINEL NODE BIOPSY Right 05/13/2015   Procedure: RIGHT  TOTAL  MASTECTOMY WITH RIGHT  AXILLARY SENTINEL NODE BIOPSY;  Surgeon: [name], MD;  Location: [facility];  Service: General;  Laterality: Right;   TUBAL LIGATION     Social History   Socioeconomic History   Marital status: Single    Spouse name: Not on file   Number of children: 2   Years of education: 12   Highest education level: Not on file  Occupational History   Occupation: Solicitor: Short  Tobacco Use   Smoking status: Never   Smokeless tobacco: Never  Vaping Use   Vaping Use: Never used  Substance and Sexual Activity   Alcohol use: No    Alcohol/week: 0.0 standard drinks   Drug use: No   Sexual activity: Not on file  Other Topics Concern   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    Patient is single and lives alone.    Patient is right handed.    Social Determinants of Health   Financial Resource Strain: Not on file  Food Insecurity: Not on file  Transportation Needs: Not on file  Physical Activity: Not on file  Stress: Not on file  Social Connections: Not on file   Allergies  Allergen Reactions   Ace Inhibitors Anaphylaxis     Angioedema (tongue swelling)   Clarithromycin Anaphylaxis    Throat swells   Contrave [Naltrexone-Bupropion Hcl Er]     Chest and sob   Levaquin [Levofloxacin] Other (See Comments)    Tendon pain - shoulder and calf   Family History  Problem Relation Age of Onset   Coronary artery disease Mother    Heart disease Mother    Diabetes Mother    Hypertension Mother    Stroke Mother    Alcohol abuse Father    COPD Maternal Grandmother    Hypertension Other    Hyperlipidemia Other    Colon cancer Neg Hx    Rectal cancer Neg Hx    Stomach cancer Neg Hx    Esophageal cancer Neg Hx      Current Outpatient Medications (Cardiovascular):    hydrochlorothiazide (HYDRODIURIL) 25 MG tablet, TAKE  1 TABLET BY MOUTH EVERY DAY   losartan (COZAAR) 50 MG tablet, TAKE 1 TABLET EVERY DAY   metoprolol tartrate (LOPRESSOR) 50 MG tablet, TAKE 1 TABLET TWICE DAILY  Current Outpatient Medications (Respiratory):    azelastine (ASTELIN) 0.1 % nasal spray, azelastine 137 mcg (0.1 %) nasal spray aerosol   budesonide-formoterol (SYMBICORT) 160-4.5 MCG/ACT inhaler, Inhale 2 puffs into the lungs 2 (two) times daily.   fluticasone (FLONASE) 50 MCG/ACT nasal spray, SHAKE LIQUID AND USE 2 SPRAYS IN EACH NOSTRIL DAILY   ipratropium (ATROVENT) 0.06 %  nasal spray, Place 2 sprays into both nostrils 4 (four) times daily as needed (nasal congestion/drainage).   loratadine (CLARITIN) 10 MG tablet, Take 10 mg by mouth daily.   Current Outpatient Medications (Hematological):    ferrous gluconate (FERGON) 324 MG tablet, Take 1 tablet (324 mg total) by mouth 2 (two) times daily with a meal. (Patient taking differently: Take 324 mg by mouth 3 (three) times a week.)   rivaroxaban (XARELTO) 20 MG TABS tablet, TAKE 1 TABLET(20 MG) BY MOUTH DAILY WITH SUPPER  Current Outpatient Medications (Other):    ALPRAZolam (XANAX) 0.5 MG tablet, Take 1 tablet (0.5 mg total) by mouth 2 (two) times daily as needed for anxiety or sleep.   azithromycin (ZITHROMAX) 250 MG tablet, Take 2 tablets PO on day one, and one tablet PO daily thereafter until completed.   b complex vitamins tablet, Take 1 tablet by mouth daily.   BD PEN NEEDLE MICRO U/F 32G X 6 MM MISC, Inject into the skin.   COPAXONE 40 MG/ML SOSY, INJECT ONE SYRINGE SUBCUTANEOUSLY THREE TIMES PER WEEK AT LEAST 48 HOURS APART. ALLOW SYRINGE TO WARM TO ROOM TEMPERATURE FOR 20 MINUTES. REFRIGERATE.   gabapentin (NEURONTIN) 100 MG capsule, Take 3 capsules (300 mg total) by mouth 3 (three) times daily.   KLOR-CON M10 10 MEQ tablet, TAKE 4 TABLETS BY MOUTH EVERY DAY   LINZESS 145 MCG CAPS capsule, TAKE 1 CAPSULE BY MOUTH DAILY BEFORE BREAKFAST.   pantoprazole (PROTONIX) 40 MG  tablet, Take 1 tablet (40 mg total) by mouth daily.   PENNSAID 2 % SOLN, APPLY 2 PUMPS (2 GRAMS) TO AFFECTED AREA TOPICALLY TWICE DAILY AS DIRECTED   potassium chloride (KLOR-CON) 10 MEQ tablet, TAKE 4 TABLETS(40 MEQ) BY MOUTH DAILY   Tart Cherry 1200 MG CAPS, Take by mouth.   triamcinolone cream (KENALOG) 0.1 %, Apply 1 application topically 2 (two) times daily.   TURMERIC PO, Take by mouth daily.   valACYclovir (VALTREX) 500 MG tablet, Take 500 mg by mouth 2 (two) times daily.   VITAMIN D PO, Take 2,000 Units by mouth daily.    Objective  Blood pressure 130/86, pulse 64, height 5\' 3"  (1.6 m), weight 215 lb (97.5 kg), last menstrual period 11/01/2012, SpO2 98 %.   General: No apparent distress alert and oriented x3 mood and affect normal, dressed appropriately.  HEENT: Pupils equal, extraocular movements intact  Respiratory: Patient's speak in full sentences and does not appear short of breath  Cardiovascular: No lower extremity edema, non tender, no erythema  Gait normal with good balance and coordination.  MSK: Patient shoulders today do have full range of motion noted at this moment.  Positive crossover noted.  The patient does not have any significant weakness.  Minimal impingement signs noted.  Left thumb has a callus formation noted over the IP joint.  Freely movable and very mild tender.  No breakdown of the skin noted.  Neurovascular intact distally with good capillary refill    Impression and Recommendations:     The above documentation has been reviewed and is accurate and complete Lyndal Pulley, DO

## 2021-06-15 ENCOUNTER — Other Ambulatory Visit: Payer: Self-pay

## 2021-06-15 ENCOUNTER — Ambulatory Visit (INDEPENDENT_AMBULATORY_CARE_PROVIDER_SITE_OTHER): Payer: BC Managed Care – PPO | Admitting: Family Medicine

## 2021-06-15 VITALS — BP 130/86 | HR 64 | Ht 63.0 in | Wt 215.0 lb

## 2021-06-15 DIAGNOSIS — M12811 Other specific arthropathies, not elsewhere classified, right shoulder: Secondary | ICD-10-CM | POA: Diagnosis not present

## 2021-06-15 DIAGNOSIS — L84 Corns and callosities: Secondary | ICD-10-CM | POA: Diagnosis not present

## 2021-06-15 DIAGNOSIS — M19011 Primary osteoarthritis, right shoulder: Secondary | ICD-10-CM

## 2021-06-15 NOTE — Assessment & Plan Note (Signed)
Patient's left thumb does seem to have more callus noted over the dorsal aspect.  Patient is full range of motion is nontender.  Patient states it has been there for 2 to 3 years.  Minorly tender and freely movable.  I believe that this is more secondary to callus formation than anything else.  We discussed potential further evaluation with imaging and with a hand specialist but patient declined.  Patient will continue to monitor and if any changes will seek medical attention.

## 2021-06-15 NOTE — Patient Instructions (Signed)
Good to see you   

## 2021-06-15 NOTE — Assessment & Plan Note (Signed)
Patient is doing significantly better at this time.  Has full range of motion.  Do not feel that further work-up is necessary.  Discussed with patient though that if any worsening pain and that she should seek medical attention again and see [place].  Otherwise at this moment can follow-up with me as needed.

## 2021-06-18 ENCOUNTER — Other Ambulatory Visit: Payer: Self-pay | Admitting: Internal Medicine

## 2021-06-19 ENCOUNTER — Encounter: Payer: Self-pay | Admitting: Internal Medicine

## 2021-06-29 ENCOUNTER — Encounter: Payer: Self-pay | Admitting: Internal Medicine

## 2021-06-30 MED ORDER — FLUTICASONE PROPIONATE 50 MCG/ACT NA SUSP: NASAL | 6 refills | Status: DC

## 2021-06-30 NOTE — Telephone Encounter (Signed)
Fluticasone 50 mg/act was refilled to [facility].

## 2021-07-01 ENCOUNTER — Ambulatory Visit: Payer: BC Managed Care – PPO | Admitting: Allergy

## 2021-07-09 ENCOUNTER — Other Ambulatory Visit: Payer: Self-pay

## 2021-07-09 ENCOUNTER — Ambulatory Visit (INDEPENDENT_AMBULATORY_CARE_PROVIDER_SITE_OTHER): Payer: BC Managed Care – PPO

## 2021-07-09 DIAGNOSIS — Z23 Encounter for immunization: Secondary | ICD-10-CM | POA: Diagnosis not present

## 2021-07-14 ENCOUNTER — Other Ambulatory Visit: Payer: Self-pay | Admitting: Internal Medicine

## 2021-07-25 ENCOUNTER — Other Ambulatory Visit: Payer: Self-pay | Admitting: Internal Medicine

## 2021-08-07 ENCOUNTER — Other Ambulatory Visit: Payer: Self-pay | Admitting: Internal Medicine

## 2021-08-13 ENCOUNTER — Other Ambulatory Visit: Payer: Self-pay | Admitting: Internal Medicine

## 2021-08-31 ENCOUNTER — Encounter: Payer: Self-pay | Admitting: Internal Medicine

## 2021-09-03 ENCOUNTER — Other Ambulatory Visit: Payer: Self-pay | Admitting: Internal Medicine

## 2021-09-04 ENCOUNTER — Other Ambulatory Visit: Payer: Self-pay | Admitting: Internal Medicine

## 2021-09-04 DIAGNOSIS — G5711 Meralgia paresthetica, right lower limb: Secondary | ICD-10-CM

## 2021-09-07 ENCOUNTER — Telehealth: Payer: Self-pay | Admitting: Cardiology

## 2021-09-07 NOTE — Telephone Encounter (Signed)
°  Pt c/o of Chest Pain: STAT if CP now or developed within 24 hours  1. Are you having CP right now? Yes   2. Are you experiencing any other symptoms (ex. SOB, nausea, vomiting, sweating)? No   3. How long have you been experiencing CP? 3 days  4. Is your CP continuous or coming and going? Stays long period of time,   5. Have you taken Nitroglycerin? No   Pt said she's been experiencing pain on her top part of her left shoulder for the last 3 days, she said its a nagging pain that she feels and its coming down now to the part where deltoid muscles is, she said her HR 78-82 and she does take tylenol it will relieved the pain but after few minutes the pain comes back   ?

## 2021-09-07 NOTE — Telephone Encounter (Signed)
Spoke with the patient who reports over the past several days she has had pain in her left shoulder. She denies any chest pain. No current pain at this time. She states that it comes any goes. She describes it as a nagging, aching pain. Does not radiate anywhere. Denies any shortness of breath, dizziness, lightheadedness, nausea or palpitations. She states her heart rate has been controlled. She has been feeling great otherwise. Denies any injury to the shoulder although thought she might have slept on it wrong the other night. She does not think that there is any swelling, bruising or redness. There is no difference in her pain with movement. I have advised the patient to continue using tylenol as needed and try icing it or using a heating pad. Advised to contact her PCP if symptoms do not resolve. Patient verbalized understanding.

## 2021-09-29 ENCOUNTER — Other Ambulatory Visit: Payer: Self-pay | Admitting: Internal Medicine

## 2021-09-29 DIAGNOSIS — Z1231 Encounter for screening mammogram for malignant neoplasm of breast: Secondary | ICD-10-CM

## 2021-10-31 ENCOUNTER — Other Ambulatory Visit: Payer: Self-pay

## 2021-10-31 ENCOUNTER — Other Ambulatory Visit: Payer: Self-pay | Admitting: Internal Medicine

## 2021-10-31 ENCOUNTER — Encounter: Payer: Self-pay | Admitting: Internal Medicine

## 2021-10-31 MED ORDER — AZELASTINE HCL 0.1 % NA SOLN: NASAL | 3 refills | Status: DC

## 2021-10-31 MED ORDER — AZELASTINE HCL 0.1 % NA SOLN: 2.0000 | Freq: Two times a day (BID) | NASAL | 2 refills | Status: DC

## 2021-11-04 ENCOUNTER — Ambulatory Visit: Payer: BC Managed Care – PPO

## 2021-11-04 ENCOUNTER — Ambulatory Visit
Admission: RE | Admit: 2021-11-04 | Discharge: 2021-11-04 | Disposition: A | Discharge status: Home / Self Care | Payer: BC Managed Care – PPO | Source: Ambulatory Visit | Attending: Internal Medicine | Admitting: Internal Medicine

## 2021-11-04 DIAGNOSIS — Z1231 Encounter for screening mammogram for malignant neoplasm of breast: Secondary | ICD-10-CM

## 2021-11-04 HISTORY — DX: Malignant neoplasm of unspecified site of unspecified female breast: C50.919

## 2021-11-06 ENCOUNTER — Encounter: Payer: Self-pay | Admitting: Internal Medicine

## 2021-11-06 NOTE — Patient Instructions (Addendum)
Blood work was ordered.     Medications changes include :   zpak, diflucan    Your prescription(s) have been sent to your pharmacy.     Return in about 6 months (around 05/09/2022) for 6 month f/u.    Health Maintenance, Female Adopting a healthy lifestyle and getting preventive care are important in promoting health and wellness. Ask your health care provider about: The right schedule for you to have regular tests and exams. Things you can do on your own to prevent diseases and keep yourself healthy. What should I know about diet, weight, and exercise? Eat a healthy diet  Eat a diet that includes plenty of vegetables, fruits, low-fat dairy products, and lean protein. Do not eat a lot of foods that are high in solid fats, added sugars, or sodium. Maintain a healthy weight Body mass index (BMI) is used to identify weight problems. It estimates body fat based on height and weight. Your health care provider can help determine your BMI and help you achieve or maintain a healthy weight. Get regular exercise Get regular exercise. This is one of the most important things you can do for your health. Most adults should: Exercise for at least 150 minutes each week. The exercise should increase your heart rate and make you sweat (moderate-intensity exercise). Do strengthening exercises at least twice a week. This is in addition to the moderate-intensity exercise. Spend less time sitting. Even light physical activity can be beneficial. Watch cholesterol and blood lipids Have your blood tested for lipids and cholesterol at 60 years of age, then have this test every 5 years. Have your cholesterol levels checked more often if: Your lipid or cholesterol levels are high. You are older than 60 years of age. You are at high risk for heart disease. What should I know about cancer screening? Depending on your health history and family history, you may need to have cancer screening at various  ages. This may include screening for: Breast cancer. Cervical cancer. Colorectal cancer. Skin cancer. Lung cancer. What should I know about heart disease, diabetes, and high blood pressure? Blood pressure and heart disease High blood pressure causes heart disease and increases the risk of stroke. This is more likely to develop in people who have high blood pressure readings or are overweight. Have your blood pressure checked: Every 3-5 years if you are 40-52 years of age. Every year if you are 46 years old or older. Diabetes Have regular diabetes screenings. This checks your fasting blood sugar level. Have the screening done: Once every three years after age 59 if you are at a normal weight and have a low risk for diabetes. More often and at a younger age if you are overweight or have a high risk for diabetes. What should I know about preventing infection? Hepatitis B If you have a higher risk for hepatitis B, you should be screened for this virus. Talk with your health care provider to find out if you are at risk for hepatitis B infection. Hepatitis C Testing is recommended for: Everyone born from [birth year] through 1964. Anyone with known risk factors for hepatitis C. Sexually transmitted infections (STIs) Get screened for STIs, including gonorrhea and chlamydia, if: You are sexually active and are younger than 60 years of age. You are older than 60 years of age and your health care provider tells you that you are at risk for this type of infection. Your sexual activity has changed since you were last  screened, and you are at increased risk for chlamydia or gonorrhea. Ask your health care provider if you are at risk. Ask your health care provider about whether you are at high risk for HIV. Your health care provider may recommend a prescription medicine to help prevent HIV infection. If you choose to take medicine to prevent HIV, you should first get tested for HIV. You should then be tested  every 3 months for as long as you are taking the medicine. Pregnancy If you are about to stop having your period (premenopausal) and you may become pregnant, seek counseling before you get pregnant. Take 400 to 800 micrograms (mcg) of folic acid every day if you become pregnant. Ask for birth control (contraception) if you want to prevent pregnancy. Osteoporosis and menopause Osteoporosis is a disease in which the bones lose minerals and strength with aging. This can result in bone fractures. If you are 100 years old or older, or if you are at risk for osteoporosis and fractures, ask your health care provider if you should: Be screened for bone loss. Take a calcium or vitamin D supplement to lower your risk of fractures. Be given hormone replacement therapy (HRT) to treat symptoms of menopause. Follow these instructions at home: Alcohol use Do not drink alcohol if: Your health care provider tells you not to drink. You are pregnant, may be pregnant, or are planning to become pregnant. If you drink alcohol: Limit how much you have to: 0-1 drink a day. Know how much alcohol is in your drink. In the [place] one drink equals one 12 oz bottle of beer (355 mL), one 5 oz glass of wine (148 mL), or one 1 oz glass of hard liquor (44 mL). Lifestyle Do not use any products that contain nicotine or tobacco. These products include cigarettes, chewing tobacco, and vaping devices, such as e-cigarettes. If you need help quitting, ask your health care provider. Do not use street drugs. Do not share needles. Ask your health care provider for help if you need support or information about quitting drugs. General instructions Schedule regular health, dental, and eye exams. Stay current with your vaccines. Tell your health care provider if: You often feel depressed. You have ever been abused or do not feel safe at home. Summary Adopting a healthy lifestyle and getting preventive care are important in promoting  health and wellness. Follow your health care provider's instructions about healthy diet, exercising, and getting tested or screened for diseases. Follow your health care provider's instructions on monitoring your cholesterol and blood pressure. This information is not intended to replace advice given to you by your health care provider. Make sure you discuss any questions you have with your health care provider. Document Revised: 01/24/2021 Document Reviewed: 01/24/2021 Elsevier Patient Education  [place].

## 2021-11-06 NOTE — Progress Notes (Signed)
Subjective:    Patient ID: [name] [name], female    DOB: 02/10/1961, 61 y.o.   MRN: [id number]   This visit occurred during the SARS-CoV-2 public health emergency.  Safety protocols were in place, including screening questions prior to the visit, additional usage of staff PPE, and extensive cleaning of exam room while observing appropriate contact time as indicated for disinfecting solutions.    HPI She is here for a physical exam.   She has a sinus infection - she started using her cpap again and got sick after using it - got congested and it turned into a sinus infection.  All of her teeth hurt, she has significant sinus congestion, sinus pressure.  She has long history of sinus infections.  Her symptoms are not improving.  She has stopped using the CPAP and does not plan on restarting it.  Other than that she feels great.  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   Medications and allergies reviewed with patient and updated if appropriate.  Patient Active Problem List   Diagnosis Date Noted   Prediabetes 11/09/2021   Corn or callus 06/15/2021   Rotator cuff arthropathy of right shoulder 11/16/2020   OSA (obstructive sleep apnea) 10/08/2020   Disorder of SI (sacroiliac) joint 07/28/2020   Atypical chest pain 12/22/2019   AC (acromioclavicular) arthritis 10/09/2019   Acute pain of right shoulder 09/04/2019   Atrial flutter ([place]) 02/28/2019   RLS (restless legs syndrome) 11/21/2018   Sleep difficulties 08/06/2018   Trigger finger 01/23/2018   Meralgia paraesthetica, right 11/05/2017   Iron deficiency 10/09/2017   Vitamin D deficiency 06/15/2017   Lumbar radiculopathy 04/21/2017   Trigger index finger of left hand 04/21/2017   Morbid obesity ([place]) 04/21/2017   Left hip pain 10/31/2016   Pes planus 10/31/2016   Posterior tibialis muscle dysfunction 10/31/2016    Breast pain, right 08/24/2016   Palpitation 04/04/2016   Hand arthritis 04/04/2016   Malignant neoplasm of upper-outer quadrant of right breast in female, 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[place]) 11/03/2015   S/P mastectomy 05/13/2015   Chemotherapy-induced neuropathy ([place]) 04/21/2015   Genetic testing 12/11/2014   Hypokalemia 12/10/2014   Breast cancer of upper-outer quadrant of right female breast ([place]) 10/23/2014   Allergic rhinitis 11/29/2009   KELOID 11/05/2009   CONSTIPATION, CHRONIC 16/60/6301   METABOLIC SYNDROME X 60/06/9322   GERD 01/07/2008   Essential hypertension 09/20/2007   Multiple sclerosis ([place]) 09/18/2007    Current Outpatient Medications on File Prior to Visit  Medication Sig Dispense Refill   ALPRAZolam (XANAX) 0.5 MG tablet Take 1 tablet (0.5 mg total) by mouth 2 (two) times daily as needed for anxiety or sleep. 30 tablet 0   azelastine (ASTELIN) 0.1 % nasal spray Place 2 sprays into both nostrils 2 (two) times daily. Use in each nostril as directed 30 mL 2   Azelastine HCl 137 MCG/SPRAY SOLN Place 2 sprays into both nostrils 2 (two) times daily. Use in each nostril as directed 30 mL 3   b complex vitamins tablet Take 1 tablet by mouth daily.     BD PEN NEEDLE MICRO U/F 32G X 6 MM MISC Inject into the skin.     budesonide-formoterol (SYMBICORT) 160-4.5 MCG/ACT inhaler Inhale 2 puffs into the lungs 2 (two) times daily. 10.2 g 5   COPAXONE 40 MG/ML SOSY INJECT ONE SYRINGE SUBCUTANEOUSLY THREE  TIMES PER WEEK AT LEAST 48 HOURS APART. ALLOW SYRINGE TO WARM TO ROOM TEMPERATURE FOR 20 MINUTES. REFRIGERATE. 36 mL 2   ferrous gluconate (FERGON) 324 MG tablet Take 1 tablet (324 mg total) by mouth 2 (two) times daily with a meal. (Patient taking differently: Take 324 mg by mouth 3 (three) times a week.) 180 tablet 3   fluticasone (FLONASE) 50 MCG/ACT nasal spray SHAKE LIQUID AND USE 2 SPRAYS IN EACH NOSTRIL DAILY 16 g 6   gabapentin (NEURONTIN) 100 MG capsule TAKE 3 CAPSULES BY  MOUTH 3 TIMES DAILY. 810 capsule 1   hydrochlorothiazide (HYDRODIURIL) 25 MG tablet TAKE 1 TABLET BY MOUTH EVERY DAY 90 tablet 1   ipratropium (ATROVENT) 0.06 % nasal spray Place 2 sprays into both nostrils 4 (four) times daily as needed (nasal congestion/drainage). 15 mL 5   KLOR-CON M10 10 MEQ tablet TAKE 4 TABLETS BY MOUTH EVERY DAY 120 tablet 5   LINZESS 145 MCG CAPS capsule TAKE 1 CAPSULE BY MOUTH EVERY DAY BEFORE BREAKFAST 90 capsule 1   loratadine (CLARITIN) 10 MG tablet Take 10 mg by mouth daily.     losartan (COZAAR) 50 MG tablet TAKE 1 TABLET BY MOUTH EVERY DAY 90 tablet 1   metoprolol tartrate (LOPRESSOR) 50 MG tablet TAKE 1 TABLET TWICE A DAY 180 tablet 1   pantoprazole (PROTONIX) 40 MG tablet TAKE 1 TABLET BY MOUTH EVERY DAY 90 tablet 1   PENNSAID 2 % SOLN APPLY 2 PUMPS (2 GRAMS) TO AFFECTED AREA TOPICALLY TWICE DAILY AS DIRECTED 112 g 3   rivaroxaban (XARELTO) 20 MG TABS tablet TAKE 1 TABLET(20 MG) BY MOUTH DAILY WITH SUPPER 90 tablet 1   Tart Cherry 1200 MG CAPS Take by mouth.     triamcinolone cream (KENALOG) 0.1 % Apply 1 application topically 2 (two) times daily. 30 g 0   TURMERIC PO Take by mouth daily.     valACYclovir (VALTREX) 500 MG tablet Take 500 mg by mouth 2 (two) times daily.     VITAMIN D PO Take 2,000 Units by mouth daily.     No current facility-administered medications on file prior to visit.    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   Anxiety    Breast cancer ([place])    Breast cancer of upper-outer quadrant of right female breast ([place]) 10/23/2014   s/p r mastectomy, neoadj chemo completed 04/21/15; neg genetic testing   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   CHEST PAIN-UNSPECIFIED 05/19/2010   Qualifier: Diagnosis of  By: Aundra Dubin, MD, [place]     Chronic fatigue  06/08/2016   Colon polyps 05/2014   adenomatous, colo q 5y   Conjunctiva disorder 08/06/2018   CONSTIPATION, CHRONIC    Cough 04/25/2016   Depression 09/03/2016   Ear canal dryness 01/23/2018   FATIGUE 06/24/2008   Qualifier: Diagnosis of  By: Wynona Luna    GERD    Herpes zoster without complication 62/94/7654   Hot flashes    HYPERLIPIDEMIA    HYPERTENSION    Hypokalemia 12/10/2014   Lactose intolerance    LRTI (lower respiratory tract infection) 09/05/2017   LUQ cramping 04/21/2017   Migraine headache    MIGRAINES, HX OF 09/18/2007   Qualifier: Diagnosis of  By: Danelle Earthly CMA, Darlene     MS (multiple  sclerosis) ([place]) 11/03/2015   Multiple sclerosis ([place])    Muscle strain 10/21/2016   OBESITY, TRUNCAL    OTHER SYMPTOMS INVOLVING DIGESTIVE SYSTEM OTHER 12/14/2009   Qualifier: Diagnosis of  By: Asa Lente MD, Valerie A    Otitis, externa, infective 05/24/2016   PERSONAL HX COLONIC POLYPS 04/21/2009   Qualifier: Diagnosis of  By: Trellis Paganini PA-c, Amy S    SINUSITIS 01/14/2010   Qualifier: Diagnosis of  By: Asa Lente MD, Jannifer Rodney    Sleep apnea    Upper respiratory tract infection 09/23/2016   UTI 08/19/2010   Qualifier: Diagnosis of  By: Marca Ancona RMA, [place]     Wheezing 09/10/2017    Past Surgical History:  Procedure Laterality Date   BREAST BIOPSY Right    [place]    COLONOSCOPY     MASTECTOMY Right    PORT-A-CATH REMOVAL Left 05/13/2015   Procedure: REMOVAL PORT-A-CATH;  Surgeon: [name], MD;  Location: [facility];  Service: General;  Laterality: Left;   PORTACATH PLACEMENT Left 11/06/2014   Procedure: INSERTION PORT-A-CATH;  Surgeon: [name], MD;  Location: [facility];  Service: General;  Laterality: Left;   SIMPLE MASTECTOMY WITH AXILLARY SENTINEL NODE BIOPSY Right 05/13/2015   Procedure: RIGHT TOTAL  MASTECTOMY WITH RIGHT  AXILLARY SENTINEL NODE BIOPSY;  Surgeon: [name], MD;  Location: [facility];  Service: General;  Laterality: Right;    TUBAL LIGATION      Social History   Socioeconomic History   Marital status: Single    Spouse name: Not on file   Number of children: 2   Years of education: 12   Highest education level: Not on file  Occupational History   Occupation: Solicitor: [employer]  Tobacco Use   Smoking status: Never   Smokeless tobacco: Never  Vaping Use   Vaping Use: Never used  Substance and Sexual Activity   Alcohol use: No    Alcohol/week: 0.0 standard drinks   Drug use: No   Sexual activity: Not on file  Other Topics Concern   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    Patient is single and lives alone.    Patient is right handed.    Social Determinants of Health   Financial Resource Strain: Not on file  Food Insecurity: Not on file  Transportation Needs: Not on file  Physical Activity: Not on file  Stress: Not on file  Social Connections: Not on file    Family History  Problem Relation Age of Onset   Coronary artery disease Mother    Heart disease Mother    Diabetes Mother    Hypertension Mother    Stroke Mother    Alcohol abuse Father    COPD Maternal Grandmother    Hypertension Other    Hyperlipidemia Other    Colon cancer Neg Hx    Rectal cancer Neg Hx    Stomach cancer Neg Hx    Esophageal cancer Neg Hx    Breast cancer Neg Hx     Review of Systems  Constitutional:  Negative for chills and fever.  HENT:  Positive for congestion, postnasal drip and sinus pressure. Negative for ear pain and sore throat.   Respiratory:  Positive for cough (at night) and wheezing (mild). Negative for shortness of breath.   Cardiovascular:  Negative for chest pain, palpitations and leg swelling.  Gastrointestinal:  Positive for constipation (controlled). Negative for abdominal pain, blood in stool, diarrhea and nausea.       No gerd  Genitourinary:  Negative  for dysuria, frequency and hematuria.  Musculoskeletal:  Negative for arthralgias and back pain.  Skin:  Negative for rash.  Neurological:  Positive for dizziness, light-headedness and headaches.  Psychiatric/Behavioral:  Negative for dysphoric mood. The patient is nervous/anxious.       Objective:   Vitals:   11/09/21 0943  BP: 128/78  Pulse: 89  Temp: 98.2 F (36.8 C)  SpO2: 98%   Filed Weights   11/09/21 0943  Weight: 216 lb (98 kg)   Body mass index is 38.26 kg/m.  BP Readings from Last 3 Encounters:  11/09/21 128/78  06/15/21 130/86  04/18/21 136/76    Wt Readings from Last 3 Encounters:  11/09/21 216 lb (98 kg)  06/15/21 215 lb (97.5 kg)  04/18/21 216 lb 3.2 oz (98.1 kg)    Depression screen [facility] 2/9 09/20/2020 07/28/2020  Decreased Interest 1 0  Down, Depressed, Hopeless 1 0  PHQ - 2 Score 2 0  Altered sleeping 1 -  Tired, decreased energy 0 -  Change in appetite 1 -  Feeling bad or failure about yourself  1 -  Trouble concentrating 0 -  Moving slowly or fidgety/restless 0 -  Suicidal thoughts 0 -  PHQ-9 Score 5 -  Difficult doing work/chores Not difficult at all -  Some recent data might be hidden     No flowsheet data found.     Physical Exam Constitutional: She appears well-developed and well-nourished. No distress.  HENT:  Head: Normocephalic and atraumatic.  Right Ear: External ear normal. Normal ear canal and TM Left Ear: External ear normal.  Normal ear canal and TM Mouth/Throat: Oropharynx is clear and moist.  Eyes: Conjunctivae and EOM are normal.  Neck: Neck supple. No tracheal deviation present. No thyromegaly present.  No carotid bruit  Cardiovascular: Normal rate, regular rhythm and normal heart sounds.   No murmur heard.  No edema. Pulmonary/Chest: Effort normal and breath sounds normal. No respiratory distress. She has no wheezes. She has no rales.  Breast: deferred   Abdominal: Soft. She exhibits no distension. There is no  tenderness.  Lymphadenopathy: She has no cervical adenopathy.  Skin: Skin is warm and dry. She is not diaphoretic.  Psychiatric: She has a normal mood and affect. Her behavior is normal.     Lab Results  Component Value Date   WBC 6.8 09/20/2020   HGB 13.8 09/20/2020   HCT 41.1 09/20/2020   PLT 239 09/20/2020   GLUCOSE 78 09/20/2020   CHOL 215 (H) 09/20/2020   TRIG 175 (H) 09/20/2020   HDL 51 09/20/2020   LDLDIRECT 120.0 12/08/2019   LDLCALC 133 (H) 09/20/2020   ALT 21 09/20/2020   AST 26 09/20/2020   NA 136 09/20/2020   K 4.1 09/20/2020   CL 99 09/20/2020   CREATININE 0.81 09/20/2020   BUN 9 09/20/2020   CO2 20 09/20/2020   TSH 1.650 09/20/2020   HGBA1C 5.3 09/20/2020         Assessment & Plan:   Physical exam: Screening blood work  ordered Exercise  walking more - more active with work Weight  encouraged weight loss Substance abuse  none   Reviewed recommended immunizations.   Health Maintenance  Topic Date Due   COVID-19 Vaccine (3 - Moderna risk series) 11/25/2021 (Originally 01/13/2020)   MAMMOGRAM  11/05/2023  PAP SMEAR-Modifier  12/04/2023   COLONOSCOPY (Pts 45-99yrs Insurance coverage will need to be confirmed)  08/26/2024   TETANUS/TDAP  09/01/2027   INFLUENZA VACCINE  Completed   Hepatitis C Screening  Completed   HIV Screening  Completed   Zoster Vaccines- Shingrix  Completed   HPV VACCINES  Aged Out          See Problem List for Assessment and Plan of chronic medical problems.

## 2021-11-08 ENCOUNTER — Encounter: Payer: BC Managed Care – PPO | Admitting: Internal Medicine

## 2021-11-09 ENCOUNTER — Ambulatory Visit (INDEPENDENT_AMBULATORY_CARE_PROVIDER_SITE_OTHER): Payer: BC Managed Care – PPO | Admitting: Internal Medicine

## 2021-11-09 ENCOUNTER — Other Ambulatory Visit: Payer: Self-pay

## 2021-11-09 VITALS — BP 128/78 | HR 89 | Temp 98.2°F | Ht 63.0 in | Wt 216.0 lb

## 2021-11-09 DIAGNOSIS — I484 Atypical atrial flutter: Secondary | ICD-10-CM | POA: Diagnosis not present

## 2021-11-09 DIAGNOSIS — G620 Drug-induced polyneuropathy: Secondary | ICD-10-CM

## 2021-11-09 DIAGNOSIS — T451X5A Adverse effect of antineoplastic and immunosuppressive drugs, initial encounter: Secondary | ICD-10-CM

## 2021-11-09 DIAGNOSIS — J0100 Acute maxillary sinusitis, unspecified: Secondary | ICD-10-CM

## 2021-11-09 DIAGNOSIS — E876 Hypokalemia: Secondary | ICD-10-CM | POA: Diagnosis not present

## 2021-11-09 DIAGNOSIS — I10 Essential (primary) hypertension: Secondary | ICD-10-CM | POA: Diagnosis not present

## 2021-11-09 DIAGNOSIS — Z0000 Encounter for general adult medical examination without abnormal findings: Secondary | ICD-10-CM

## 2021-11-09 DIAGNOSIS — K219 Gastro-esophageal reflux disease without esophagitis: Secondary | ICD-10-CM

## 2021-11-09 DIAGNOSIS — E559 Vitamin D deficiency, unspecified: Secondary | ICD-10-CM

## 2021-11-09 DIAGNOSIS — G35 Multiple sclerosis: Secondary | ICD-10-CM

## 2021-11-09 DIAGNOSIS — K5909 Other constipation: Secondary | ICD-10-CM

## 2021-11-09 DIAGNOSIS — G4733 Obstructive sleep apnea (adult) (pediatric): Secondary | ICD-10-CM

## 2021-11-09 DIAGNOSIS — R7303 Prediabetes: Secondary | ICD-10-CM | POA: Diagnosis not present

## 2021-11-09 LAB — COMPREHENSIVE METABOLIC PANEL
ALT: 16 U/L (ref 0–35)
AST: 16 U/L (ref 0–37)
Albumin: 4.4 g/dL (ref 3.5–5.2)
Alkaline Phosphatase: 78 U/L (ref 39–117)
BUN: 13 mg/dL (ref 6–23)
CO2: 33 mEq/L — ABNORMAL HIGH (ref 19–32)
Calcium: 9.9 mg/dL (ref 8.4–10.5)
Chloride: 102 mEq/L (ref 96–112)
Creatinine, Ser: 1.11 mg/dL (ref 0.40–1.20)
GFR: 54.42 mL/min — ABNORMAL LOW (ref >60.00)
Glucose, Bld: 96 mg/dL (ref 70–99)
Potassium: 3.8 mEq/L (ref 3.5–5.1)
Sodium: 138 mEq/L (ref 135–145)
Total Bilirubin: 0.5 mg/dL (ref 0.2–1.2)
Total Protein: 7.7 g/dL (ref 6.0–8.3)

## 2021-11-09 LAB — CBC WITH DIFFERENTIAL/PLATELET
Basophils Absolute: 0 10*3/uL (ref 0.0–0.1)
Basophils Relative: 0.2 % (ref 0.0–3.0)
Eosinophils Absolute: 0.1 10*3/uL (ref 0.0–0.7)
Eosinophils Relative: 2.1 % (ref 0.0–5.0)
HCT: 36.9 % (ref 36.0–46.0)
Hemoglobin: 12.3 g/dL (ref 12.0–15.0)
Lymphocytes Relative: 29.7 % (ref 12.0–46.0)
Lymphs Abs: 2 10*3/uL (ref 0.7–4.0)
MCHC: 33.3 g/dL (ref 30.0–36.0)
MCV: 88.8 fl (ref 78.0–100.0)
Monocytes Absolute: 0.5 10*3/uL (ref 0.1–1.0)
Monocytes Relative: 8 % (ref 3.0–12.0)
Neutro Abs: 4 10*3/uL (ref 1.4–7.7)
Neutrophils Relative %: 60 % (ref 43.0–77.0)
Platelets: 239 10*3/uL (ref 150.0–400.0)
RBC: 4.15 Mil/uL (ref 3.87–5.11)
RDW: 14.7 % (ref 11.5–15.5)
WBC: 6.7 10*3/uL (ref 4.0–10.5)

## 2021-11-09 LAB — HEMOGLOBIN A1C: Hgb A1c MFr Bld: 5.7 % (ref 4.6–6.5)

## 2021-11-09 LAB — LIPID PANEL
Cholesterol: 202 mg/dL — ABNORMAL HIGH (ref 0–200)
HDL: 43.7 mg/dL (ref >39.00)
NonHDL: 158.35
Total CHOL/HDL Ratio: 5
Triglycerides: 289 mg/dL — ABNORMAL HIGH (ref 0.0–149.0)
VLDL: 57.8 mg/dL — ABNORMAL HIGH (ref 0.0–40.0)

## 2021-11-09 LAB — TSH: TSH: 2.14 u[IU]/mL (ref 0.35–5.50)

## 2021-11-09 LAB — VITAMIN D 25 HYDROXY (VIT D DEFICIENCY, FRACTURES): VITD: 47.13 ng/mL (ref 30.00–100.00)

## 2021-11-09 LAB — LDL CHOLESTEROL, DIRECT: Direct LDL: 111 mg/dL

## 2021-11-09 MED ORDER — AZITHROMYCIN 250 MG PO TABS: ORAL_TABLET | ORAL | 0 refills | Status: AC

## 2021-11-09 MED ORDER — FLUCONAZOLE 150 MG PO TABS: 150.0000 mg | ORAL_TABLET | Freq: Once | ORAL | 0 refills | Status: AC

## 2021-11-09 NOTE — Assessment & Plan Note (Signed)
Chronic Using CPAP nightly 

## 2021-11-09 NOTE — Assessment & Plan Note (Signed)
Chronic Continue gabapentin 100 mg 3 times daily

## 2021-11-09 NOTE — Assessment & Plan Note (Signed)
Chronic Likely diuretic induced-HCTZ Continue [place]-Con 40 mill equivalents daily

## 2021-11-09 NOTE — Assessment & Plan Note (Signed)
Chronic Blood pressure well controlled CMP Continue metoprolol 50 mg twice daily, losartan 50 mg daily, HCTZ 25 mg daily 

## 2021-11-09 NOTE — Assessment & Plan Note (Signed)
Chronic On Copaxone Management per Dr. Dohmeier 

## 2021-11-09 NOTE — Assessment & Plan Note (Signed)
Acute Likely bacterial  Start Z-Pak, which is typically effective for her Can take over-the-counter cold medications for symptom relief  Rest, fluid Call if no improvement

## 2021-11-09 NOTE — Assessment & Plan Note (Signed)
Chronic Following with cardiology Asymptomatic On metoprolol 50 mg twice daily and Xarelto 20 mg daily CBC, CMP 

## 2021-11-09 NOTE — Assessment & Plan Note (Signed)
Chronic GERD controlled Continue pantoprazole 40 mg daily 

## 2021-11-09 NOTE — Assessment & Plan Note (Signed)
Chronic Controlled Continue Linzess 145 mcg daily

## 2021-11-09 NOTE — Assessment & Plan Note (Signed)
Chronic Taking vitamin D daily Check vitamin D level  

## 2021-11-09 NOTE — Assessment & Plan Note (Signed)
Chronic Check a1c Low sugar / carb diet Stressed regular exercise  

## 2021-11-28 ENCOUNTER — Encounter: Payer: Self-pay | Admitting: Internal Medicine

## 2021-12-02 ENCOUNTER — Other Ambulatory Visit: Payer: Self-pay | Admitting: Internal Medicine

## 2021-12-07 ENCOUNTER — Telehealth: Payer: Self-pay

## 2021-12-07 NOTE — Telephone Encounter (Signed)
To be honest I do not know anything about it. ?

## 2021-12-08 NOTE — Telephone Encounter (Signed)
Spoke with patient today. 

## 2021-12-26 ENCOUNTER — Encounter: Payer: Self-pay | Admitting: Internal Medicine

## 2021-12-26 NOTE — Progress Notes (Signed)
? ? ?Subjective:  ? ? Patient ID: [name] [name], female    DOB: 09-07-1961, 61 y.o.   MRN: [id number] ? ?This visit occurred during the SARS-CoV-2 public health emergency.  Safety protocols were in place, including screening questions prior to the visit, additional usage of staff PPE, and extensive cleaning of exam room while observing appropriate contact time as indicated for disinfecting solutions. ? ? ? ?HPI ?[name] [name] is here for  ?Chief Complaint  ?Patient presents with  ? Wheezing  ? ? ?She has been wheezing and minimal cough from allergies.  She states wheeze, cough, mild tightness in chest, sinus pressure and PND.   She states is worse at night.   ? ?Taking claritin, astelin nasal spray, flonase nasal spray.  She uses her symbicort twice daily.  ? ? ?Medications and allergies reviewed with patient and updated if appropriate. ? ?Current Outpatient Medications on File Prior to Visit  ?Medication Sig Dispense Refill  ? ALPRAZolam (XANAX) 0.5 MG tablet Take 1 tablet (0.5 mg total) by mouth 2 (two) times daily as needed for anxiety or sleep. 30 tablet 0  ? azelastine (ASTELIN) 0.1 % nasal spray Place 2 sprays into both nostrils 2 (two) times daily. Use in each nostril as directed 30 mL 2  ? Azelastine HCl 137 MCG/SPRAY SOLN Place 2 sprays into both nostrils 2 (two) times daily. Use in each nostril as directed 30 mL 3  ? b complex vitamins tablet Take 1 tablet by mouth daily.    ? BD PEN NEEDLE MICRO U/F 32G X 6 MM MISC Inject into the skin.    ? budesonide-formoterol (SYMBICORT) 160-4.5 MCG/ACT inhaler Inhale 2 puffs into the lungs 2 (two) times daily. 10.2 g 5  ? COPAXONE 40 MG/ML SOSY INJECT ONE SYRINGE SUBCUTANEOUSLY THREE TIMES PER WEEK AT LEAST 48 HOURS APART. ALLOW SYRINGE TO WARM TO ROOM TEMPERATURE FOR 20 MINUTES. REFRIGERATE. 36 mL 2  ? ferrous gluconate (FERGON) 324 MG tablet Take 1 tablet (324 mg total) by mouth 2 (two) times daily with a meal. (Patient taking differently: Take 324 mg by mouth 3  (three) times a week.) 180 tablet 3  ? fluticasone (FLONASE) 50 MCG/ACT nasal spray SHAKE LIQUID AND USE 2 SPRAYS IN EACH NOSTRIL DAILY 16 g 6  ? gabapentin (NEURONTIN) 100 MG capsule TAKE 3 CAPSULES BY MOUTH 3 TIMES DAILY. 810 capsule 1  ? hydrochlorothiazide (HYDRODIURIL) 25 MG tablet TAKE 1 TABLET BY MOUTH EVERY DAY 90 tablet 1  ? ipratropium (ATROVENT) 0.06 % nasal spray Place 2 sprays into both nostrils 4 (four) times daily as needed (nasal congestion/drainage). 15 mL 5  ? KLOR-CON M10 10 MEQ tablet TAKE 4 TABLETS BY MOUTH EVERY DAY 120 tablet 5  ? LINZESS 145 MCG CAPS capsule TAKE 1 CAPSULE BY MOUTH EVERY DAY BEFORE BREAKFAST 90 capsule 1  ? loratadine (CLARITIN) 10 MG tablet Take 10 mg by mouth daily.    ? losartan (COZAAR) 50 MG tablet TAKE 1 TABLET BY MOUTH EVERY DAY 90 tablet 1  ? metoprolol tartrate (LOPRESSOR) 50 MG tablet TAKE 1 TABLET TWICE A DAY 180 tablet 1  ? pantoprazole (PROTONIX) 40 MG tablet TAKE 1 TABLET BY MOUTH EVERY DAY 90 tablet 1  ? PENNSAID 2 % SOLN APPLY 2 PUMPS (2 GRAMS) TO AFFECTED AREA TOPICALLY TWICE DAILY AS DIRECTED 112 g 3  ? rivaroxaban (XARELTO) 20 MG TABS tablet TAKE 1 TABLET(20 MG) BY MOUTH DAILY WITH SUPPER 90 tablet 1  ? Tart Cherry 1200 MG CAPS  Take by mouth.    ? triamcinolone cream (KENALOG) 0.1 % Apply 1 application topically 2 (two) times daily. 30 g 0  ? TURMERIC PO Take by mouth daily.    ? valACYclovir (VALTREX) 500 MG tablet Take 500 mg by mouth 2 (two) times daily.    ? VITAMIN D PO Take 2,000 Units by mouth daily.    ? ?No current facility-administered medications on file prior to visit.  ? ? ?Review of Systems  ?Constitutional:  Negative for chills and fever.  ?HENT:  Positive for postnasal drip and sinus pressure (left side). Negative for congestion, ear pain and sore throat.   ?Respiratory:  Positive for cough (most dry - productive at times with discolorted mucus), chest tightness (mild) and wheezing. Negative for shortness of breath.   ?Neurological:   Negative for dizziness and headaches.  ? ?   ?Objective:  ? ?Vitals:  ? 12/27/21 0746  ?BP: 134/80  ?Pulse: 76  ?Temp: 98.3 ?F (36.8 ?C)  ?SpO2: 98%  ? ?BP Readings from Last 3 Encounters:  ?12/27/21 134/80  ?11/09/21 128/78  ?06/15/21 130/86  ? ?Wt Readings from Last 3 Encounters:  ?12/27/21 215 lb 12.8 oz (97.9 kg)  ?11/09/21 216 lb (98 kg)  ?06/15/21 215 lb (97.5 kg)  ? ?Body mass index is 38.23 kg/m?. ? ?  ?Physical Exam ?Constitutional:   ?   General: She is not in acute distress. ?   Appearance: Normal appearance. She is not ill-appearing.  ?HENT:  ?   Head: Normocephalic and atraumatic.  ?   Right Ear: Tympanic membrane, ear canal and external ear normal.  ?   Left Ear: Tympanic membrane, ear canal and external ear normal.  ?   Mouth/Throat:  ?   Mouth: Mucous membranes are moist.  ?   Pharynx: No oropharyngeal exudate or posterior oropharyngeal erythema.  ?Eyes:  ?   Conjunctiva/sclera: Conjunctivae normal.  ?Cardiovascular:  ?   Rate and Rhythm: Normal rate and regular rhythm.  ?Pulmonary:  ?   Effort: Pulmonary effort is normal. No respiratory distress.  ?   Breath sounds: Wheezing (diffuse with expiration) present. No rales.  ?Musculoskeletal:  ?   Cervical back: Neck supple. No tenderness.  ?Lymphadenopathy:  ?   Cervical: No cervical adenopathy.  ?Skin: ?   General: Skin is warm and dry.  ?Neurological:  ?   Mental Status: She is alert.  ? ?   ? ? ? ? ? ?Assessment & Plan:  ? ? ?See Problem List for Assessment and Plan of chronic medical problems.  ? ? ? ? ?

## 2021-12-27 ENCOUNTER — Ambulatory Visit: Payer: BC Managed Care – PPO | Admitting: Internal Medicine

## 2021-12-27 DIAGNOSIS — I10 Essential (primary) hypertension: Secondary | ICD-10-CM

## 2021-12-27 DIAGNOSIS — J45909 Unspecified asthma, uncomplicated: Secondary | ICD-10-CM | POA: Insufficient documentation

## 2021-12-27 DIAGNOSIS — J4531 Mild persistent asthma with (acute) exacerbation: Secondary | ICD-10-CM | POA: Diagnosis not present

## 2021-12-27 DIAGNOSIS — J45901 Unspecified asthma with (acute) exacerbation: Secondary | ICD-10-CM | POA: Insufficient documentation

## 2021-12-27 MED ORDER — PREDNISONE 20 MG PO TABS: 20.0000 mg | ORAL_TABLET | Freq: Every day | ORAL | 0 refills | Status: DC

## 2021-12-27 MED ORDER — ALBUTEROL SULFATE HFA 108 (90 BASE) MCG/ACT IN AERS: 2.0000 | INHALATION_SPRAY | Freq: Four times a day (QID) | RESPIRATORY_TRACT | 5 refills | Status: DC | PRN

## 2021-12-27 MED ORDER — AMOXICILLIN-POT CLAVULANATE 875-125 MG PO TABS: 1.0000 | ORAL_TABLET | Freq: Two times a day (BID) | ORAL | 0 refills | Status: DC

## 2021-12-27 NOTE — Assessment & Plan Note (Signed)
Chronic ?Blood pressure well controlled ?Continue metoprolol 50 mg twice daily, losartan 50 mg daily, HCTZ 25 mg daily ?

## 2021-12-27 NOTE — Patient Instructions (Addendum)
? ? ? ? ?  Medications changes include :   prednisone 40 mg daily with food x 5 days, augmentin x 10 days, albuterol inhaler as needed ? ? ?Your prescription(s) have been sent to your pharmacy.  ? ? ? ? ?Return if symptoms worsen or fail to improve. ? ?

## 2021-12-27 NOTE — Assessment & Plan Note (Addendum)
Acute ?Symptoms consistent with asthma exacerbation-diffuse expiratory wheeze ?Concern for possible bacterial infection ?Continue allergy medications-Astelin nasal spray, Flonase nasal spray, Claritin ?Continue Symbicort twice daily ?Prednisone 40 mg daily with breakfast x5 days ?Augmentin 875-125 mg twice daily x10 days ?Albuterol inhaler every 6 hours as needed ?Call if no improvement ?

## 2022-01-03 ENCOUNTER — Other Ambulatory Visit: Payer: Self-pay | Admitting: Internal Medicine

## 2022-01-06 MED ORDER — FLUCONAZOLE 150 MG PO TABS: 150.0000 mg | ORAL_TABLET | Freq: Once | ORAL | 0 refills | Status: AC

## 2022-01-11 ENCOUNTER — Encounter: Payer: Self-pay | Admitting: Internal Medicine

## 2022-01-13 ENCOUNTER — Telehealth: Payer: Self-pay | Admitting: Internal Medicine

## 2022-01-13 MED ORDER — HYDROCODONE BIT-HOMATROP MBR 5-1.5 MG/5ML PO SOLN: 5.0000 mL | Freq: Three times a day (TID) | ORAL | 0 refills | Status: DC | PRN

## 2022-01-13 NOTE — Telephone Encounter (Signed)
Pt called in to remind MD to please call in cough medication with codeine.  ? ?She asks that it be done ASAP as she is wanting to get some sleep.  ? ?[facility], [place] Phone:  [phone]  ?Fax:  [phone]  ?  ? ?

## 2022-01-13 NOTE — Telephone Encounter (Signed)
Done

## 2022-01-17 LAB — HM PAP SMEAR: HPV, high-risk: NEGATIVE

## 2022-01-18 ENCOUNTER — Other Ambulatory Visit: Payer: Self-pay | Admitting: Internal Medicine

## 2022-01-19 ENCOUNTER — Other Ambulatory Visit: Payer: Self-pay | Admitting: Internal Medicine

## 2022-01-23 ENCOUNTER — Other Ambulatory Visit: Payer: Self-pay | Admitting: Internal Medicine

## 2022-03-03 ENCOUNTER — Other Ambulatory Visit: Payer: Self-pay | Admitting: Internal Medicine

## 2022-04-17 ENCOUNTER — Other Ambulatory Visit: Payer: Self-pay | Admitting: Internal Medicine

## 2022-04-17 NOTE — Progress Notes (Deleted)
[name] [name] Phone: [phone] Subjective:    I'm seeing this patient by the request  of:  Binnie Rail, MD  CC: Foot and toe pain  [id number]  06/15/2021 Patient's left thumb does seem to have more callus noted over the dorsal aspect.  Patient is full range of motion is nontender.  Patient states it has been there for 2 to 3 years.  Minorly tender and freely movable.  I believe that this is more secondary to callus formation than anything else.  We discussed potential further evaluation with imaging and with a hand specialist but patient declined.  Patient will continue to monitor and if any changes will seek medical attention.  Patient is doing significantly better at this time.  Has full range of motion.  Do not feel that further work-up is necessary.  Discussed with patient though that if any worsening pain and that she should seek medical attention again and see [place].  Otherwise at this moment can follow-up with me as needed.  Updated 04/18/2022 [name] [name] is a 61 y.o. 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   CHEST PAIN-UNSPECIFIED 05/19/2010   Qualifier: Diagnosis of  By: Aundra Dubin, MD, [place]     Chronic fatigue 06/08/2016   Colon polyps 05/2014   adenomatous, colo q 5y   Conjunctiva disorder 08/06/2018   CONSTIPATION, CHRONIC    Cough 04/25/2016   Depression 09/03/2016   Ear canal dryness 01/23/2018   FATIGUE  06/24/2008   Qualifier: Diagnosis of  By: Wynona Luna    GERD    Herpes zoster without complication 77/41/2878   Hot flashes    HYPERLIPIDEMIA    HYPERTENSION    Hypokalemia 12/10/2014   Lactose intolerance    LRTI (lower respiratory tract infection) 09/05/2017   LUQ cramping 04/21/2017   Migraine headache    MIGRAINES, HX OF 09/18/2007   Qualifier: Diagnosis of  By: Danelle Earthly CMA, Darlene     MS (multiple sclerosis) ([place]) 11/03/2015   Multiple sclerosis ([place])    Muscle strain 10/21/2016   OBESITY, TRUNCAL    OTHER SYMPTOMS INVOLVING DIGESTIVE SYSTEM OTHER 12/14/2009   Qualifier: Diagnosis of  By: Asa Lente MD, Valerie A    Otitis, externa, infective 05/24/2016   PERSONAL HX COLONIC POLYPS 04/21/2009   Qualifier: Diagnosis of  By: Shane Crutch, Amy S    SINUSITIS 01/14/2010   Qualifier: Diagnosis of  By: Asa Lente MD, Jannifer Rodney    Sleep apnea    Upper respiratory tract infection 09/23/2016   UTI 08/19/2010   Qualifier: Diagnosis of  By: Reatha Armour, Lucy     Wheezing 09/10/2017   Past Surgical History:  Procedure Laterality Date   BREAST BIOPSY Right    [place]    COLONOSCOPY     MASTECTOMY Right    PORT-A-CATH REMOVAL Left 05/13/2015   Procedure: REMOVAL PORT-A-CATH;  Surgeon: [name], MD;  Location: [facility];  Service: General;  Laterality: Left;   PORTACATH PLACEMENT Left 11/06/2014   Procedure: INSERTION PORT-A-CATH;  Surgeon: [name], MD;  Location: [facility];  Service: General;  Laterality: Left;   SIMPLE MASTECTOMY WITH AXILLARY SENTINEL NODE BIOPSY Right 05/13/2015   Procedure: RIGHT TOTAL  MASTECTOMY WITH RIGHT  AXILLARY SENTINEL NODE BIOPSY;  Surgeon: [name], MD;  Location: [facility];  Service: General;  Laterality: Right;   TUBAL LIGATION     Social History   Socioeconomic History   Marital status: Single    Spouse name: Not on file   Number of children: 2   Years of education: 12   Highest education level: Not on file   Occupational History   Occupation: Solicitor: [employer]  Tobacco Use   Smoking status: Never   Smokeless tobacco: Never  Vaping Use   Vaping Use: Never used  Substance and Sexual Activity   Alcohol use: No    Alcohol/week: 0.0 standard drinks of alcohol   Drug use: No   Sexual activity: Not on file  Other Topics Concern   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    Patient is single and lives alone.    Patient is right handed.    Social Determinants of Health   Financial Resource Strain: Not on file  Food Insecurity: Not on file  Transportation Needs: Not on file  Physical Activity: Not on file  Stress: Not on file  Social Connections: Not on file   Allergies  Allergen Reactions   Ace Inhibitors Anaphylaxis     Angioedema (tongue swelling)   Clarithromycin Anaphylaxis    Throat swells   Contrave [Naltrexone-Bupropion Hcl Er]     Chest and sob   Levaquin [Levofloxacin] Other (See Comments)    Tendon pain - shoulder and calf   Family History  Problem Relation Age of Onset   Coronary artery disease Mother    Heart disease Mother    Diabetes Mother    Hypertension Mother    Stroke Mother    Alcohol abuse Father    COPD Maternal Grandmother    Hypertension Other    Hyperlipidemia Other    Colon cancer Neg Hx    Rectal cancer Neg Hx    Stomach cancer Neg Hx    Esophageal cancer Neg Hx    Breast cancer Neg Hx     Current Outpatient Medications (Endocrine & Metabolic):    predniSONE (DELTASONE) 20 MG tablet, Take 1 tablet (20 mg total) by mouth daily with breakfast.  Current Outpatient Medications (Cardiovascular):    hydrochlorothiazide (HYDRODIURIL) 25 MG tablet, TAKE 1 TABLET BY MOUTH EVERY DAY   losartan (COZAAR) 50 MG tablet, TAKE 1 TABLET BY MOUTH EVERY DAY   metoprolol tartrate (LOPRESSOR) 50 MG tablet, TAKE 1 TABLET BY MOUTH  TWICE A DAY  Current Outpatient Medications (Respiratory):    albuterol (VENTOLIN HFA) 108 (90 Base) MCG/ACT inhaler, Inhale 2 puffs into the lungs every 6 (six) hours as needed for wheezing or shortness of breath.   Azelastine HCl 137 MCG/SPRAY SOLN, Place 2 sprays into both nostrils 2 (two) times daily. Use in each nostril as directed   Azelastine HCl 137 MCG/SPRAY SOLN, PLACE 2 SPRAYS INTO BOTH NOSTRILS 2 (TWO) TIMES DAILY. USE IN EACH NOSTRIL AS DIRECTED   budesonide-formoterol (SYMBICORT) 160-4.5 MCG/ACT inhaler, Inhale 2 puffs into  the lungs 2 (two) times daily.   fluticasone (FLONASE) 50 MCG/ACT nasal spray, SHAKE LIQUID AND USE 2 SPRAYS IN EACH NOSTRIL DAILY   HYDROcodone bit-homatropine (HYCODAN) 5-1.5 MG/5ML syrup, Take 5 mLs by mouth every 8 (eight) hours as needed for cough.   ipratropium (ATROVENT) 0.06 % nasal spray, Place 2 sprays into both nostrils 4 (four) times daily as needed (nasal congestion/drainage).   loratadine (CLARITIN) 10 MG tablet, Take 10 mg by mouth daily.   Current Outpatient Medications (Hematological):    ferrous gluconate (FERGON) 324 MG tablet, Take 1 tablet (324 mg total) by mouth 2 (two) times daily with a meal. (Patient taking differently: Take 324 mg by mouth 3 (three) times a week.)   XARELTO 20 MG TABS tablet, TAKE 1 TABLET(20 MG) BY MOUTH DAILY WITH SUPPER  Current Outpatient Medications (Other):    ALPRAZolam (XANAX) 0.5 MG tablet, Take 1 tablet (0.5 mg total) by mouth 2 (two) times daily as needed for anxiety or sleep.   amoxicillin-clavulanate (AUGMENTIN) 875-125 MG tablet, Take 1 tablet by mouth 2 (two) times daily.   b complex vitamins tablet, Take 1 tablet by mouth daily.   BD PEN NEEDLE MICRO U/F 32G X 6 MM MISC, Inject into the skin.   COPAXONE 40 MG/ML SOSY, INJECT ONE SYRINGE SUBCUTANEOUSLY THREE TIMES PER WEEK AT LEAST 48 HOURS APART. ALLOW SYRINGE TO WARM TO ROOM TEMPERATURE FOR 20 MINUTES. REFRIGERATE.   gabapentin (NEURONTIN) 100 MG  capsule, TAKE 3 CAPSULES BY MOUTH 3 TIMES DAILY.   LINZESS 145 MCG CAPS capsule, TAKE 1 CAPSULE BY MOUTH EVERY DAY BEFORE BREAKFAST   pantoprazole (PROTONIX) 40 MG tablet, TAKE 1 TABLET BY MOUTH EVERY DAY   PENNSAID 2 % SOLN, APPLY 2 PUMPS (2 GRAMS) TO AFFECTED AREA TOPICALLY TWICE DAILY AS DIRECTED   potassium chloride (KLOR-CON M10) 10 MEQ tablet, TAKE 4 TABLETS BY MOUTH EVERY DAY   Tart Cherry 1200 MG CAPS, Take by mouth.   triamcinolone cream (KENALOG) 0.1 %, Apply 1 application topically 2 (two) times daily.   TURMERIC PO, Take by mouth daily.   valACYclovir (VALTREX) 500 MG tablet, Take 500 mg by mouth 2 (two) times daily.   VITAMIN D PO, Take 2,000 Units by mouth daily.   Reviewed prior external information including notes and imaging from  primary care provider As well as notes that were available from care everywhere and other healthcare systems.  Past medical history, social, surgical and family history all reviewed in electronic medical record.  No pertanent information unless stated regarding to the chief complaint.   Review of Systems:  No headache, visual changes, nausea, vomiting, diarrhea, constipation, dizziness, abdominal pain, skin rash, fevers, chills, night sweats, weight loss, swollen lymph nodes, body aches, joint swelling, chest pain, shortness of breath, mood changes. POSITIVE muscle aches  Objective  Last menstrual period 11/01/2012.   General: No apparent distress alert and oriented x3 mood and affect normal, dressed appropriately.  HEENT: Pupils equal, extraocular movements intact  Respiratory: Patient's speak in full sentences and does not appear short of breath  Cardiovascular: No lower extremity edema, non tender, no erythema    Limited muscular skeletal ultrasound was performed and interpreted by Hulan Saas, M      Impression and Recommendations:     The above documentation has been reviewed and is accurate and complete Lyndal Pulley,  DO

## 2022-04-18 ENCOUNTER — Ambulatory Visit: Payer: BC Managed Care – PPO | Admitting: Family Medicine

## 2022-04-26 ENCOUNTER — Encounter (INDEPENDENT_AMBULATORY_CARE_PROVIDER_SITE_OTHER): Payer: Self-pay

## 2022-04-28 NOTE — Progress Notes (Deleted)
[place] [facility] [place] Phone: [phone] Subjective:    I'm seeing this patient by the request  of:  Binnie Rail, MD  CC:   [id number]  06/15/2021 Patient's left thumb does seem to have more callus noted over the dorsal aspect.  Patient is full range of motion is nontender.  Patient states it has been there for 2 to 3 years.  Minorly tender and freely movable.  I believe that this is more secondary to callus formation than anything else.  We discussed potential further evaluation with imaging and with a hand specialist but patient declined.  Patient will continue to monitor and if any changes will seek medical attention.  Also seen for right shoulder pain  Updated 05/01/2022 Anacarolina Evelyn is a 61 y.o. 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   CHEST PAIN-UNSPECIFIED 05/19/2010   Qualifier: Diagnosis of  By: Aundra Dubin, MD, [place]     Chronic fatigue 06/08/2016   Colon polyps 05/2014   adenomatous, colo q 5y   Conjunctiva disorder 08/06/2018   CONSTIPATION, CHRONIC    Cough 04/25/2016   Depression 09/03/2016   Ear canal dryness 01/23/2018   FATIGUE 06/24/2008   Qualifier: Diagnosis of  By: Wynona Luna    GERD    Herpes zoster without complication 87/86/7672   Hot flashes    HYPERLIPIDEMIA    HYPERTENSION    Hypokalemia 12/10/2014   Lactose intolerance    LRTI (lower respiratory tract infection) 09/05/2017   LUQ cramping 04/21/2017    Migraine headache    MIGRAINES, HX OF 09/18/2007   Qualifier: Diagnosis of  By: Danelle Earthly CMA, Darlene     MS (multiple sclerosis) ([place]) 11/03/2015   Multiple sclerosis ([place])    Muscle strain 10/21/2016   OBESITY, TRUNCAL    OTHER SYMPTOMS INVOLVING DIGESTIVE SYSTEM OTHER 12/14/2009   Qualifier: Diagnosis of  By: Asa Lente MD, Valerie A    Otitis, externa, infective 05/24/2016   PERSONAL HX COLONIC POLYPS 04/21/2009   Qualifier: Diagnosis of  By: Shane Crutch, Amy S    SINUSITIS 01/14/2010   Qualifier: Diagnosis of  By: Asa Lente MD, Jannifer Rodney    Sleep apnea    Upper respiratory tract infection 09/23/2016   UTI 08/19/2010   Qualifier: Diagnosis of  By: Reatha Armour, Lucy     Wheezing 09/10/2017   Past Surgical History:  Procedure Laterality Date   BREAST BIOPSY Right    [place]    COLONOSCOPY     MASTECTOMY Right    PORT-A-CATH REMOVAL Left 05/13/2015   Procedure: REMOVAL PORT-A-CATH;  Surgeon: [name], MD;  Location: [facility];  Service: General;  Laterality: Left;   PORTACATH PLACEMENT Left 11/06/2014   Procedure: INSERTION PORT-A-CATH;  Surgeon: [name], MD;  Location: [facility];  Service: General;  Laterality: Left;   SIMPLE MASTECTOMY WITH AXILLARY SENTINEL NODE BIOPSY Right 05/13/2015   Procedure: RIGHT TOTAL  MASTECTOMY WITH RIGHT  AXILLARY SENTINEL NODE BIOPSY;  Surgeon: [name], MD;  Location: [facility];  Service: General;  Laterality: Right;   TUBAL LIGATION     Social History   Socioeconomic History   Marital status: Single    Spouse name: Not on file   Number of children: 2   Years of education: 12   Highest education level: Not on file  Occupational History   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    Patient is single and lives alone.    Patient is right handed.    Social Determinants of Health   Financial Resource Strain: Not on file  Food Insecurity: Not on file  Transportation Needs: Not on file  Physical Activity: Not on file  Stress: Not on file  Social Connections: Not on file   Allergies  Allergen Reactions   Ace Inhibitors Anaphylaxis     Angioedema (tongue swelling)   Clarithromycin Anaphylaxis    Throat swells   Contrave [Naltrexone-Bupropion Hcl Er]     Chest and sob   Levaquin [Levofloxacin] Other (See Comments)    Tendon pain - shoulder and calf   Family History  Problem Relation Age of Onset   Coronary artery disease Mother    Heart disease Mother    Diabetes Mother    Hypertension Mother    Stroke Mother    Alcohol abuse Father    COPD Maternal Grandmother    Hypertension Other    Hyperlipidemia Other    Colon cancer Neg Hx    Rectal cancer Neg Hx    Stomach cancer Neg Hx    Esophageal cancer Neg Hx    Breast cancer Neg Hx     Current Outpatient Medications (Endocrine & Metabolic):    predniSONE (DELTASONE) 20 MG tablet, Take 1 tablet (20 mg total) by mouth daily with breakfast.  Current Outpatient Medications (Cardiovascular):    hydrochlorothiazide (HYDRODIURIL) 25 MG tablet, TAKE 1 TABLET BY MOUTH EVERY DAY   losartan (COZAAR) 50 MG tablet, TAKE 1 TABLET BY MOUTH EVERY DAY   metoprolol tartrate (LOPRESSOR) 50 MG tablet, TAKE 1 TABLET BY MOUTH TWICE A DAY  Current Outpatient Medications (Respiratory):    albuterol (VENTOLIN HFA) 108 (90 Base) MCG/ACT inhaler, Inhale 2 puffs into the lungs every 6 (six) hours as needed for wheezing or shortness of breath.   Azelastine HCl 137 MCG/SPRAY SOLN, Place 2 sprays into both nostrils 2 (two) times daily.  Use in each nostril as directed   Azelastine HCl 137 MCG/SPRAY SOLN, PLACE 2 SPRAYS INTO BOTH NOSTRILS 2 (TWO) TIMES DAILY. USE IN EACH NOSTRIL AS DIRECTED   budesonide-formoterol (SYMBICORT) 160-4.5 MCG/ACT inhaler, Inhale 2 puffs into the lungs 2 (two) times daily.   fluticasone (FLONASE) 50 MCG/ACT nasal spray, SHAKE LIQUID AND USE 2 SPRAYS IN EACH NOSTRIL DAILY   HYDROcodone bit-homatropine (HYCODAN) 5-1.5 MG/5ML syrup, Take 5 mLs by mouth every 8 (eight) hours as needed for cough.   ipratropium (ATROVENT) 0.06 % nasal spray, Place  2 sprays into both nostrils 4 (four) times daily as needed (nasal congestion/drainage).   loratadine (CLARITIN) 10 MG tablet, Take 10 mg by mouth daily.   Current Outpatient Medications (Hematological):    ferrous gluconate (FERGON) 324 MG tablet, Take 1 tablet (324 mg total) by mouth 2 (two) times daily with a meal. (Patient taking differently: Take 324 mg by mouth 3 (three) times a week.)   XARELTO 20 MG TABS tablet, TAKE 1 TABLET(20 MG) BY MOUTH DAILY WITH SUPPER  Current Outpatient Medications (Other):    ALPRAZolam (XANAX) 0.5 MG tablet, Take 1 tablet (0.5 mg total) by mouth 2 (two) times daily as needed for anxiety or sleep.   amoxicillin-clavulanate (AUGMENTIN) 875-125 MG tablet, Take 1 tablet by mouth 2 (two) times daily.   b complex vitamins tablet, Take 1 tablet by mouth daily.   BD PEN NEEDLE MICRO U/F 32G X 6 MM MISC, Inject into the skin.   COPAXONE 40 MG/ML SOSY, INJECT ONE SYRINGE SUBCUTANEOUSLY THREE TIMES PER WEEK AT LEAST 48 HOURS APART. ALLOW SYRINGE TO WARM TO ROOM TEMPERATURE FOR 20 MINUTES. REFRIGERATE.   gabapentin (NEURONTIN) 100 MG capsule, TAKE 3 CAPSULES BY MOUTH 3 TIMES DAILY.   LINZESS 145 MCG CAPS capsule, TAKE 1 CAPSULE BY MOUTH EVERY DAY BEFORE BREAKFAST   pantoprazole (PROTONIX) 40 MG tablet, TAKE 1 TABLET BY MOUTH EVERY DAY   PENNSAID 2 % SOLN, APPLY 2 PUMPS (2 GRAMS) TO AFFECTED AREA TOPICALLY TWICE DAILY AS DIRECTED    potassium chloride (KLOR-CON M10) 10 MEQ tablet, TAKE 4 TABLETS BY MOUTH EVERY DAY   Tart Cherry 1200 MG CAPS, Take by mouth.   triamcinolone cream (KENALOG) 0.1 %, Apply 1 application topically 2 (two) times daily.   TURMERIC PO, Take by mouth daily.   valACYclovir (VALTREX) 500 MG tablet, Take 500 mg by mouth 2 (two) times daily.   VITAMIN D PO, Take 2,000 Units by mouth daily.   Reviewed prior external information including notes and imaging from  primary care provider As well as notes that were available from care everywhere and other healthcare systems.  Past medical history, social, surgical and family history all reviewed in electronic medical record.  No pertanent information unless stated regarding to the chief complaint.   Review of Systems:  No headache, visual changes, nausea, vomiting, diarrhea, constipation, dizziness, abdominal pain, skin rash, fevers, chills, night sweats, weight loss, swollen lymph nodes, body aches, joint swelling, chest pain, shortness of breath, mood changes. POSITIVE muscle aches  Objective  Last menstrual period 11/01/2012.   General: No apparent distress alert and oriented x3 mood and affect normal, dressed appropriately.  HEENT: Pupils equal, extraocular movements intact  Respiratory: Patient's speak in full sentences and does not appear short of breath  Cardiovascular: No lower extremity edema, non tender, no erythema      Impression and Recommendations:

## 2022-05-01 ENCOUNTER — Ambulatory Visit: Payer: BC Managed Care – PPO | Admitting: Family Medicine

## 2022-05-02 ENCOUNTER — Telehealth: Payer: Self-pay | Admitting: Cardiology

## 2022-05-02 NOTE — Telephone Encounter (Signed)
Spoke with the patient who states that she was wanting to talk with Lanny Hurst or Ivin Booty about something personal. Per below message Lanny Hurst did not wish to talk with the patient. Advised the patient that Ivin Booty was not in the office this week but I would forward the message to her.

## 2022-05-02 NOTE — Telephone Encounter (Signed)
On 8/14 patient called in requesting to speak directly with a Lanny Hurst. I asked the patient what it was regarding, but she told me it was a Air traffic controller. I contacted Lanny Hurst B from the lab and was told, "I do not know this patient, nor do I call patients." I asked if there is another Lanny Hurst in the office the patient may be referring to, but no response from [facility]. Perhaps someone directed her to [facility]? She also mentioned being able to speak with Ivin Booty regarding this. Is anyone able to appropriately direct this patient? Thank you

## 2022-05-04 ENCOUNTER — Telehealth: Payer: Self-pay

## 2022-05-04 NOTE — Telephone Encounter (Signed)
[name] [name] (Key: [id number])  Your information has been submitted to [facility]. To check for an updated outcome later, reopen this PA request from your dashboard.  If [facility] has not responded to your request within 24 hours, contact [facility] at [phone]. If you think there may be a problem with your PA request, use our live chat feature at the bottom right.

## 2022-05-09 ENCOUNTER — Encounter: Payer: Self-pay | Admitting: Internal Medicine

## 2022-05-09 NOTE — Progress Notes (Unsigned)
Subjective:    Patient ID: [name] [name], female    DOB: December 06, 1960, 61 y.o.   MRN: [id number]     HPI [name] [name] is here for follow up of her chronic medical problems, including hypertension, atypical a flutter, GERD, OSA, vitamin D deficiency, multiple sclerosis, chemo-induced neuropathy, prediabetes, chronic constipation, hypokalemia  She is taking her medication daily as prescribed.  She feels great.    She is not exercising regularly, she does play pickleball sometimes.     Medications and allergies reviewed with patient and updated if appropriate.  Current Outpatient Medications on File Prior to Visit  Medication Sig Dispense Refill   albuterol (VENTOLIN HFA) 108 (90 Base) MCG/ACT inhaler Inhale 2 puffs into the lungs every 6 (six) hours as needed for wheezing or shortness of breath. 8 g 5   ALPRAZolam (XANAX) 0.5 MG tablet Take 1 tablet (0.5 mg total) by mouth 2 (two) times daily as needed for anxiety or sleep. 30 tablet 0   Azelastine HCl 137 MCG/SPRAY SOLN PLACE 2 SPRAYS INTO BOTH NOSTRILS 2 (TWO) TIMES DAILY. USE IN EACH NOSTRIL AS DIRECTED 30 mL 2   b complex vitamins tablet Take 1 tablet by mouth daily.     BD PEN NEEDLE MICRO U/F 32G X 6 MM MISC Inject into the skin.     budesonide-formoterol (SYMBICORT) 160-4.5 MCG/ACT inhaler Inhale 2 puffs into the lungs 2 (two) times daily. 10.2 g 5   COPAXONE 40 MG/ML SOSY INJECT ONE SYRINGE SUBCUTANEOUSLY THREE TIMES PER WEEK AT LEAST 48 HOURS APART. ALLOW SYRINGE TO WARM TO ROOM TEMPERATURE FOR 20 MINUTES. REFRIGERATE. 36 mL 2   ferrous gluconate (FERGON) 324 MG tablet Take 1 tablet (324 mg total) by mouth 2 (two) times daily with a meal. (Patient taking differently: Take 324 mg by mouth 3 (three) times a week.) 180 tablet 3   fluticasone (FLONASE) 50 MCG/ACT nasal spray SHAKE LIQUID AND USE 2 SPRAYS IN EACH NOSTRIL DAILY 48 mL 2   gabapentin (NEURONTIN) 100 MG capsule TAKE 3 CAPSULES BY MOUTH 3 TIMES DAILY. 810 capsule 1    hydrochlorothiazide (HYDRODIURIL) 25 MG tablet TAKE 1 TABLET BY MOUTH EVERY DAY 90 tablet 1   ipratropium (ATROVENT) 0.06 % nasal spray Place 2 sprays into both nostrils 4 (four) times daily as needed (nasal congestion/drainage). 15 mL 5   LINZESS 145 MCG CAPS capsule TAKE 1 CAPSULE BY MOUTH EVERY DAY BEFORE BREAKFAST 90 capsule 1   loratadine (CLARITIN) 10 MG tablet Take 10 mg by mouth daily.     losartan (COZAAR) 50 MG tablet TAKE 1 TABLET BY MOUTH EVERY DAY 90 tablet 1   metoprolol tartrate (LOPRESSOR) 50 MG tablet TAKE 1 TABLET BY MOUTH TWICE A DAY 180 tablet 2   pantoprazole (PROTONIX) 40 MG tablet TAKE 1 TABLET BY MOUTH EVERY DAY 90 tablet 1   PENNSAID 2 % SOLN APPLY 2 PUMPS (2 GRAMS) TO AFFECTED AREA TOPICALLY TWICE DAILY AS DIRECTED 112 g 3   potassium chloride (KLOR-CON M10) 10 MEQ tablet TAKE 4 TABLETS BY MOUTH EVERY DAY 360 tablet 2   Tart Cherry 1200 MG CAPS Take by mouth.     triamcinolone cream (KENALOG) 0.1 % Apply 1 application topically 2 (two) times daily. 30 g 0   TURMERIC PO Take by mouth daily.     valACYclovir (VALTREX) 500 MG tablet Take 500 mg by mouth 2 (two) times daily.     VITAMIN D PO Take 2,000 Units by mouth daily.  XARELTO 20 MG TABS tablet TAKE 1 TABLET(20 MG) BY MOUTH DAILY WITH SUPPER 90 tablet 1   No current facility-administered medications on file prior to visit.     Review of Systems  Constitutional:  Negative for chills and fever.  HENT:  Positive for postnasal drip.   Respiratory:  Negative for cough, shortness of breath and wheezing.   Cardiovascular:  Negative for chest pain, palpitations and leg swelling.  Gastrointestinal:  Positive for constipation. Negative for abdominal pain, blood in stool and diarrhea.  Neurological:  Positive for headaches. Negative for light-headedness.       Objective:   Vitals:   05/10/22 0749  BP: 122/78  Pulse: 83  Temp: 98.4 F (36.9 C)  SpO2: 99%   BP Readings from Last 3 Encounters:  05/10/22  122/78  12/27/21 134/80  11/09/21 128/78   Wt Readings from Last 3 Encounters:  05/10/22 217 lb (98.4 kg)  12/27/21 215 lb 12.8 oz (97.9 kg)  11/09/21 216 lb (98 kg)   Body mass index is 38.44 kg/m.    Physical Exam Constitutional:      General: She is not in acute distress.    Appearance: Normal appearance.  HENT:     Head: Normocephalic and atraumatic.  Eyes:     Conjunctiva/sclera: Conjunctivae normal.  Cardiovascular:     Rate and Rhythm: Normal rate and regular rhythm.     Heart sounds: Normal heart sounds. No murmur heard. Pulmonary:     Effort: Pulmonary effort is normal. No respiratory distress.     Breath sounds: Normal breath sounds. No wheezing.  Musculoskeletal:     Cervical back: Neck supple.     Right lower leg: No edema.     Left lower leg: No edema.  Lymphadenopathy:     Cervical: No cervical adenopathy.  Skin:    General: Skin is warm and dry.     Findings: No rash.  Neurological:     Mental Status: She is alert. Mental status is at baseline.  Psychiatric:        Mood and Affect: Mood normal.        Behavior: Behavior normal.        Lab Results  Component Value Date   WBC 6.7 11/09/2021   HGB 12.3 11/09/2021   HCT 36.9 11/09/2021   PLT 239.0 11/09/2021   GLUCOSE 96 11/09/2021   CHOL 202 (H) 11/09/2021   TRIG 289.0 (H) 11/09/2021   HDL 43.70 11/09/2021   LDLDIRECT 111.0 11/09/2021   LDLCALC 133 (H) 09/20/2020   ALT 16 11/09/2021   AST 16 11/09/2021   NA 138 11/09/2021   K 3.8 11/09/2021   CL 102 11/09/2021   CREATININE 1.11 11/09/2021   BUN 13 11/09/2021   CO2 33 (H) 11/09/2021   TSH 2.14 11/09/2021   HGBA1C 5.7 11/09/2021     Assessment & Plan:    See Problem List for Assessment and Plan of chronic medical problems.

## 2022-05-10 ENCOUNTER — Ambulatory Visit: Payer: BC Managed Care – PPO | Admitting: Internal Medicine

## 2022-05-10 VITALS — BP 122/78 | HR 83 | Temp 98.4°F | Ht 63.0 in | Wt 217.0 lb

## 2022-05-10 DIAGNOSIS — I10 Essential (primary) hypertension: Secondary | ICD-10-CM

## 2022-05-10 DIAGNOSIS — Z23 Encounter for immunization: Secondary | ICD-10-CM

## 2022-05-10 DIAGNOSIS — E876 Hypokalemia: Secondary | ICD-10-CM | POA: Diagnosis not present

## 2022-05-10 DIAGNOSIS — K5909 Other constipation: Secondary | ICD-10-CM

## 2022-05-10 DIAGNOSIS — I484 Atypical atrial flutter: Secondary | ICD-10-CM | POA: Diagnosis not present

## 2022-05-10 DIAGNOSIS — G620 Drug-induced polyneuropathy: Secondary | ICD-10-CM

## 2022-05-10 DIAGNOSIS — K219 Gastro-esophageal reflux disease without esophagitis: Secondary | ICD-10-CM

## 2022-05-10 DIAGNOSIS — R7303 Prediabetes: Secondary | ICD-10-CM

## 2022-05-10 DIAGNOSIS — E559 Vitamin D deficiency, unspecified: Secondary | ICD-10-CM

## 2022-05-10 DIAGNOSIS — G479 Sleep disorder, unspecified: Secondary | ICD-10-CM

## 2022-05-10 DIAGNOSIS — T451X5A Adverse effect of antineoplastic and immunosuppressive drugs, initial encounter: Secondary | ICD-10-CM

## 2022-05-10 DIAGNOSIS — J4530 Mild persistent asthma, uncomplicated: Secondary | ICD-10-CM

## 2022-05-10 LAB — CBC WITH DIFFERENTIAL/PLATELET
Basophils Absolute: 0 10*3/uL (ref 0.0–0.1)
Basophils Relative: 0.4 % (ref 0.0–3.0)
Eosinophils Absolute: 0.1 10*3/uL (ref 0.0–0.7)
Eosinophils Relative: 1.4 % (ref 0.0–5.0)
HCT: 38.8 % (ref 36.0–46.0)
Hemoglobin: 13.1 g/dL (ref 12.0–15.0)
Lymphocytes Relative: 24.2 % (ref 12.0–46.0)
Lymphs Abs: 1.5 10*3/uL (ref 0.7–4.0)
MCHC: 33.8 g/dL (ref 30.0–36.0)
MCV: 87.3 fl (ref 78.0–100.0)
Monocytes Absolute: 0.3 10*3/uL (ref 0.1–1.0)
Monocytes Relative: 4.6 % (ref 3.0–12.0)
Neutro Abs: 4.4 10*3/uL (ref 1.4–7.7)
Neutrophils Relative %: 69.4 % (ref 43.0–77.0)
Platelets: 257 10*3/uL (ref 150.0–400.0)
RBC: 4.44 Mil/uL (ref 3.87–5.11)
RDW: 15.3 % (ref 11.5–15.5)
WBC: 6.3 10*3/uL (ref 4.0–10.5)

## 2022-05-10 LAB — COMPREHENSIVE METABOLIC PANEL
ALT: 17 U/L (ref 0–35)
AST: 18 U/L (ref 0–37)
Albumin: 4.6 g/dL (ref 3.5–5.2)
Alkaline Phosphatase: 84 U/L (ref 39–117)
BUN: 12 mg/dL (ref 6–23)
CO2: 28 mEq/L (ref 19–32)
Calcium: 10.2 mg/dL (ref 8.4–10.5)
Chloride: 100 mEq/L (ref 96–112)
Creatinine, Ser: 0.83 mg/dL (ref 0.40–1.20)
GFR: 76.87 mL/min (ref >60.00)
Glucose, Bld: 93 mg/dL (ref 70–99)
Potassium: 4 mEq/L (ref 3.5–5.1)
Sodium: 138 mEq/L (ref 135–145)
Total Bilirubin: 0.6 mg/dL (ref 0.2–1.2)
Total Protein: 8 g/dL (ref 6.0–8.3)

## 2022-05-10 LAB — HEMOGLOBIN A1C: Hgb A1c MFr Bld: 5.8 % (ref 4.6–6.5)

## 2022-05-10 NOTE — Assessment & Plan Note (Signed)
Chronic Taking vitamin D daily Vitamin D level was good 6 months ago-recheck next year Continue supplementation

## 2022-05-10 NOTE — Assessment & Plan Note (Signed)
Chronic Controlled, Stable Continue Linzess 145 mcg daily

## 2022-05-10 NOTE — Assessment & Plan Note (Signed)
Chronic Blood pressure well controlled CMP Continue metoprolol 50 mg twice daily, losartan 50 mg daily, HCTZ 25 mg daily

## 2022-05-10 NOTE — Assessment & Plan Note (Signed)
Chronic Following with cardiology Asymptomatic On metoprolol 50 mg twice daily and Xarelto 20 mg daily CBC, CMP

## 2022-05-10 NOTE — Progress Notes (Unsigned)
[name] [name] [place] [address] [place] Phone: [phone] Subjective:   [name] [name], am serving as a scribe for Dr. Hulan Saas.  I'm seeing this patient by the request  of:  Binnie Rail, MD  CC: Bilateral foot pain  [name]  [name] [name] is a 61 y.o. female coming in with complaint of bilateral foot/toe pain. Last seen for shoulder pain in 05/2021. Patient states pain arch of foot and big toe. No other complaints.       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   Anxiety    Breast cancer ([place])    Breast cancer of upper-outer quadrant of right female breast ([place]) 10/23/2014   s/p r mastectomy, neoadj chemo completed 04/21/15; neg genetic testing   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   CHEST PAIN-UNSPECIFIED 05/19/2010   Qualifier: Diagnosis of  By: Aundra Dubin, MD, [place]     Chronic fatigue 06/08/2016   Colon polyps 05/2014   adenomatous, colo q 5y   Conjunctiva disorder 08/06/2018   CONSTIPATION, CHRONIC    Cough 04/25/2016   Depression 09/03/2016   Ear canal dryness 01/23/2018   FATIGUE 06/24/2008   Qualifier: Diagnosis of  By: Wynona Luna    GERD    Herpes zoster without complication 93/26/7124   Hot flashes    HYPERLIPIDEMIA    HYPERTENSION    Hypokalemia 12/10/2014   Lactose intolerance    LRTI (lower respiratory tract infection) 09/05/2017   LUQ cramping 04/21/2017   Migraine headache    MIGRAINES, HX OF 09/18/2007   Qualifier: Diagnosis of  By: Danelle Earthly CMA, Darlene     MS (multiple sclerosis) ([place]) 11/03/2015   Multiple sclerosis ([place])    Muscle strain 10/21/2016   OBESITY, TRUNCAL    OTHER SYMPTOMS INVOLVING DIGESTIVE SYSTEM OTHER 12/14/2009   Qualifier: Diagnosis of  By: Asa Lente MD, Valerie A     Otitis, externa, infective 05/24/2016   PERSONAL HX COLONIC POLYPS 04/21/2009   Qualifier: Diagnosis of  By: Shane Crutch, Amy S    SINUSITIS 01/14/2010   Qualifier: Diagnosis of  By: Asa Lente MD, Jannifer Rodney    Sleep apnea    Upper respiratory tract infection 09/23/2016   UTI 08/19/2010   Qualifier: Diagnosis of  By: Marca Ancona RMA, Lucy     Wheezing 09/10/2017   Past Surgical History:  Procedure Laterality Date   BREAST BIOPSY Right    [place]    COLONOSCOPY     MASTECTOMY Right    PORT-A-CATH REMOVAL Left 05/13/2015   Procedure: REMOVAL PORT-A-CATH;  Surgeon: [name], MD;  Location: [facility];  Service: General;  Laterality: Left;   PORTACATH PLACEMENT Left 11/06/2014   Procedure: INSERTION PORT-A-CATH;  Surgeon: [name], MD;  Location: [facility];  Service: General;  Laterality: Left;   SIMPLE MASTECTOMY WITH AXILLARY SENTINEL NODE BIOPSY Right 05/13/2015   Procedure: RIGHT TOTAL  MASTECTOMY WITH RIGHT  AXILLARY SENTINEL NODE BIOPSY;  Surgeon: [name], MD;  Location: [facility];  Service: General;  Laterality: Right;   TUBAL LIGATION     Social History   Socioeconomic History   Marital status: Single    Spouse name: Not on file   Number of children: 2   Years of education: 12   Highest education  level: Not on file  Occupational History   Occupation: Solicitor: [employer]  Tobacco Use   Smoking status: Never   Smokeless tobacco: Never  Vaping Use   Vaping Use: Never used  Substance and Sexual Activity   Alcohol use: No    Alcohol/week: 0.0 standard drinks of alcohol   Drug use: No   Sexual activity: Not on file  Other Topics Concern   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    Patient is single and lives alone.    Patient is right handed.    Social Determinants of Health   Financial Resource Strain: Not on file   Food Insecurity: Not on file  Transportation Needs: Not on file  Physical Activity: Not on file  Stress: Not on file  Social Connections: Not on file   Allergies  Allergen Reactions   Ace Inhibitors Anaphylaxis     Angioedema (tongue swelling)   Clarithromycin Anaphylaxis    Throat swells   Contrave [Naltrexone-Bupropion Hcl Er]     Chest and sob   Levaquin [Levofloxacin] Other (See Comments)    Tendon pain - shoulder and calf   Family History  Problem Relation Age of Onset   Coronary artery disease Mother    Heart disease Mother    Diabetes Mother    Hypertension Mother    Stroke Mother    Alcohol abuse Father    COPD Maternal Grandmother    Hypertension Other    Hyperlipidemia Other    Colon cancer Neg Hx    Rectal cancer Neg Hx    Stomach cancer Neg Hx    Esophageal cancer Neg Hx    Breast cancer Neg Hx      Current Outpatient Medications (Cardiovascular):    hydrochlorothiazide (HYDRODIURIL) 25 MG tablet, TAKE 1 TABLET BY MOUTH EVERY DAY   losartan (COZAAR) 50 MG tablet, TAKE 1 TABLET BY MOUTH EVERY DAY   metoprolol tartrate (LOPRESSOR) 50 MG tablet, TAKE 1 TABLET BY MOUTH TWICE A DAY  Current Outpatient Medications (Respiratory):    albuterol (VENTOLIN HFA) 108 (90 Base) MCG/ACT inhaler, Inhale 2 puffs into the lungs every 6 (six) hours as needed for wheezing or shortness of breath.   Azelastine HCl 137 MCG/SPRAY SOLN, PLACE 2 SPRAYS INTO BOTH NOSTRILS 2 (TWO) TIMES DAILY. USE IN EACH NOSTRIL AS DIRECTED   budesonide-formoterol (SYMBICORT) 160-4.5 MCG/ACT inhaler, Inhale 2 puffs into the lungs 2 (two) times daily.   fluticasone (FLONASE) 50 MCG/ACT nasal spray, SHAKE LIQUID AND USE 2 SPRAYS IN EACH NOSTRIL DAILY   ipratropium (ATROVENT) 0.06 % nasal spray, Place 2 sprays into both nostrils 4 (four) times daily as needed (nasal congestion/drainage).   loratadine (CLARITIN) 10 MG tablet, Take 10 mg by mouth daily.   Current Outpatient Medications  (Hematological):    ferrous gluconate (FERGON) 324 MG tablet, Take 1 tablet (324 mg total) by mouth 2 (two) times daily with a meal. (Patient taking differently: Take 324 mg by mouth 3 (three) times a week.)   XARELTO 20 MG TABS tablet, TAKE 1 TABLET(20 MG) BY MOUTH DAILY WITH SUPPER  Current Outpatient Medications (Other):    ALPRAZolam (XANAX) 0.5 MG tablet, Take 1 tablet (0.5 mg total) by mouth 2 (two) times daily as needed for anxiety or sleep.   b complex vitamins tablet, Take 1 tablet by mouth daily.   BD  PEN NEEDLE MICRO U/F 32G X 6 MM MISC, Inject into the skin.   COPAXONE 40 MG/ML SOSY, INJECT ONE SYRINGE SUBCUTANEOUSLY THREE TIMES PER WEEK AT LEAST 48 HOURS APART. ALLOW SYRINGE TO WARM TO ROOM TEMPERATURE FOR 20 MINUTES. REFRIGERATE.   gabapentin (NEURONTIN) 100 MG capsule, TAKE 3 CAPSULES BY MOUTH 3 TIMES DAILY.   LINZESS 145 MCG CAPS capsule, TAKE 1 CAPSULE BY MOUTH EVERY DAY BEFORE BREAKFAST   pantoprazole (PROTONIX) 40 MG tablet, TAKE 1 TABLET BY MOUTH EVERY DAY   PENNSAID 2 % SOLN, APPLY 2 PUMPS (2 GRAMS) TO AFFECTED AREA TOPICALLY TWICE DAILY AS DIRECTED   potassium chloride (KLOR-CON M10) 10 MEQ tablet, TAKE 4 TABLETS BY MOUTH EVERY DAY   Tart Cherry 1200 MG CAPS, Take by mouth.   triamcinolone cream (KENALOG) 0.1 %, Apply 1 application topically 2 (two) times daily.   TURMERIC PO, Take by mouth daily.   valACYclovir (VALTREX) 500 MG tablet, Take 500 mg by mouth 2 (two) times daily.   VITAMIN D PO, Take 2,000 Units by mouth daily.   Reviewed prior external information including notes and imaging from  primary care provider As well as notes that were available from care everywhere and other healthcare systems.  Past medical history, social, surgical and family history all reviewed in electronic medical record.  No pertanent information unless stated regarding to the chief complaint.   Review of Systems:  No headache, visual changes, nausea, vomiting, diarrhea,  constipation, dizziness, abdominal pain, skin rash, fevers, chills, night sweats, weight loss, swollen lymph nodes, body aches, joint swelling, chest pain, shortness of breath, mood changes. POSITIVE muscle aches  Objective  Blood pressure 126/76, pulse (!) 104, height '5\' 3"'$  (1.6 m), last menstrual period 11/01/2012, SpO2 96 %.   General: No apparent distress alert and oriented x3 mood and affect normal, dressed appropriately.  HEENT: Pupils equal, extraocular movements intact  Respiratory: Patient's speak in full sentences and does not appear short of breath  Cardiovascular: No lower extremity edema, non tender, no erythema  Antalgic gait noted. Bilateral foot exam shows the patient does have breakdown of the transverse arch bilaterally.  Hallux limitus noted right greater than left.  Patient is tender to palpation over the first MTP on the right.  No pain over the navicular bone.  Mild overpronation of the hindfoot.  Limited muscular skeletal ultrasound was performed and interpreted by Hulan Saas, M  Limited ultrasound shows patient does have hypoechoic changes that is consistent with a capsulitis of the first MTP.  Mild to moderate arthritic changes noted.  Patient has no cortical irregularity noted though. Impression: MTP arthritis with capsulitis    Impression and Recommendations:    The above documentation has been reviewed and is accurate and complete Lyndal Pulley, DO

## 2022-05-10 NOTE — Patient Instructions (Addendum)
     Flu immunization administered today.     Blood work was ordered.     Medications changes include :   None     Return in about 6 months (around 11/10/2022) for Physical Exam.

## 2022-05-10 NOTE — Assessment & Plan Note (Addendum)
Chronic GERD controlled Continue pantoprazole 40 mg daily 

## 2022-05-10 NOTE — Assessment & Plan Note (Signed)
Chronic Intermittent Okay to continue alprazolam 0.5 mg nightly as needed for anxiety or sleep

## 2022-05-10 NOTE — Addendum Note (Signed)
Addended by: Marcina Millard on: 05/10/2022 08:56 AM   Modules accepted: Orders

## 2022-05-10 NOTE — Assessment & Plan Note (Signed)
Chronic Controlled Continue gabapentin 300 mg 3 times daily 

## 2022-05-10 NOTE — Assessment & Plan Note (Addendum)
Chronic Mild, intermittent Continue Symbicort 160-4.5 mcg per ACT 2 puffs twice daily prn Continue albuterol as needed

## 2022-05-10 NOTE — Assessment & Plan Note (Signed)
Chronic Related to HCTZ CMP Continue Klor-Con 40 mEq daily

## 2022-05-10 NOTE — Assessment & Plan Note (Signed)
Chronic Check a1c Low sugar / carb diet Stressed regular exercise  

## 2022-05-11 ENCOUNTER — Encounter: Payer: Self-pay | Admitting: Family Medicine

## 2022-05-11 ENCOUNTER — Ambulatory Visit: Payer: Self-pay

## 2022-05-11 ENCOUNTER — Ambulatory Visit: Payer: BC Managed Care – PPO | Admitting: Family Medicine

## 2022-05-11 VITALS — BP 126/76 | HR 104 | Ht 63.0 in

## 2022-05-11 DIAGNOSIS — M79671 Pain in right foot: Secondary | ICD-10-CM | POA: Diagnosis not present

## 2022-05-11 DIAGNOSIS — M79672 Pain in left foot: Secondary | ICD-10-CM

## 2022-05-11 DIAGNOSIS — M19079 Primary osteoarthritis, unspecified ankle and foot: Secondary | ICD-10-CM | POA: Diagnosis not present

## 2022-05-11 NOTE — Patient Instructions (Addendum)
Voltaren and Ice Rigid sole shoes Oofos or hoka recovery sandals Iron with Vit C '500mg'$  daily for a month then 3 times a week

## 2022-05-11 NOTE — Assessment & Plan Note (Signed)
Arthritis noted of the first toes bilaterally right greater than left.  Discussed breakdown of the transverse arch noted.  Discussed home exercises and icing regimen.  Increase activity slowly otherwise.  Follow-up with me again in 6 to 8 weeks worsening pain consider injection.  Discussed rigid soled shoes

## 2022-05-12 ENCOUNTER — Encounter: Payer: Self-pay | Admitting: Internal Medicine

## 2022-05-12 MED ORDER — AZITHROMYCIN 250 MG PO TABS: ORAL_TABLET | ORAL | 0 refills | Status: DC

## 2022-05-12 NOTE — Addendum Note (Signed)
Addended by: Binnie Rail on: 05/12/2022 05:19 PM   Modules accepted: Orders

## 2022-05-20 ENCOUNTER — Encounter: Payer: Self-pay | Admitting: Internal Medicine

## 2022-05-28 ENCOUNTER — Other Ambulatory Visit: Payer: Self-pay | Admitting: Internal Medicine

## 2022-06-07 ENCOUNTER — Encounter: Payer: Self-pay | Admitting: Internal Medicine

## 2022-06-08 ENCOUNTER — Encounter: Payer: Self-pay | Admitting: Cardiology

## 2022-06-22 ENCOUNTER — Ambulatory Visit: Payer: BC Managed Care – PPO | Admitting: Family Medicine

## 2022-06-24 ENCOUNTER — Other Ambulatory Visit: Payer: Self-pay | Admitting: Internal Medicine

## 2022-06-27 NOTE — Progress Notes (Signed)
[name] [name] [employer] [address] [place] Phone: [phone] Subjective:   [name], am serving as a scribe for Dr. Hulan Saas.  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  Discussed breakdown of the transverse arch noted.  Discussed home exercises and icing regimen.  Increase activity slowly otherwise.  Follow-up with me again in 6 to 8 weeks worsening pain consider injection.  Discussed rigid soled shoes  Update 06/28/2022 [name] [name] is a 60 y.o. female coming in with complaint of B foot pain. Patient here today to talk about trigger finger. Patient states 2nd digit on right hand has been catching lately. Starting to wake her up at night.  Affecting daily activities such as repetitive motions including patient's work sometimes.     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   Anxiety    Breast cancer ([place])    Breast cancer of upper-outer quadrant of right female breast ([place]) 10/23/2014   s/p r mastectomy, neoadj chemo completed 04/21/15; neg genetic testing   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   CHEST PAIN-UNSPECIFIED 05/19/2010   Qualifier: Diagnosis of  By: Aundra Dubin, MD, [place]     Chronic fatigue 06/08/2016   Colon polyps 05/2014   adenomatous, colo q 5y   Conjunctiva disorder 08/06/2018   CONSTIPATION, CHRONIC    Cough 04/25/2016   Depression 09/03/2016   Ear canal dryness 01/23/2018   FATIGUE 06/24/2008   Qualifier: Diagnosis of  By: Wynona Luna    GERD    Herpes zoster without complication 09/32/6712   Hot flashes    HYPERLIPIDEMIA    HYPERTENSION    Hypokalemia  12/10/2014   Lactose intolerance    LRTI (lower respiratory tract infection) 09/05/2017   LUQ cramping 04/21/2017   Migraine headache    MIGRAINES, HX OF 09/18/2007   Qualifier: Diagnosis of  By: Danelle Earthly CMA, Darlene     MS (multiple sclerosis) ([place]) 11/03/2015   Multiple sclerosis ([place])    Muscle strain 10/21/2016   OBESITY, TRUNCAL    OTHER SYMPTOMS INVOLVING DIGESTIVE SYSTEM OTHER 12/14/2009   Qualifier: Diagnosis of  By: Asa Lente MD, Valerie A    Otitis, externa, infective 05/24/2016   PERSONAL HX COLONIC POLYPS 04/21/2009   Qualifier: Diagnosis of  By: Shane Crutch, Amy S    SINUSITIS 01/14/2010   Qualifier: Diagnosis of  By: Asa Lente MD, Jannifer Rodney    Sleep apnea    Upper respiratory tract infection 09/23/2016   UTI 08/19/2010   Qualifier: Diagnosis of  By: Marca Ancona RMA, Lucy     Wheezing 09/10/2017   Past Surgical History:  Procedure Laterality Date   BREAST BIOPSY Right    [place]    COLONOSCOPY     MASTECTOMY Right    PORT-A-CATH REMOVAL Left 05/13/2015   Procedure: REMOVAL PORT-A-CATH;  Surgeon: [name], MD;  Location: [facility];  Service: General;  Laterality: Left;   PORTACATH PLACEMENT Left 11/06/2014   Procedure: INSERTION PORT-A-CATH;  Surgeon: [name], MD;  Location: [facility];  Service: General;  Laterality: Left;   SIMPLE MASTECTOMY WITH AXILLARY SENTINEL NODE BIOPSY Right 05/13/2015  Procedure: RIGHT TOTAL  MASTECTOMY WITH RIGHT  AXILLARY SENTINEL NODE BIOPSY;  Surgeon: [name], MD;  Location: [facility];  Service: General;  Laterality: Right;   TUBAL LIGATION     Social History   Socioeconomic History   Marital status: Single    Spouse name: Not on file   Number of children: 2   Years of education: 12   Highest education level: Not on file  Occupational History   Occupation: Solicitor: [employer]  Tobacco Use   Smoking status: Never   Smokeless tobacco: Never  Vaping Use   Vaping Use: Never  used  Substance and Sexual Activity   Alcohol use: No    Alcohol/week: 0.0 standard drinks of alcohol   Drug use: No   Sexual activity: Not on file  Other Topics Concern   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    Patient is single and lives alone.    Patient is right handed.    Social Determinants of Health   Financial Resource Strain: Not on file  Food Insecurity: Not on file  Transportation Needs: Not on file  Physical Activity: Not on file  Stress: Not on file  Social Connections: Not on file   Allergies  Allergen Reactions   Ace Inhibitors Anaphylaxis     Angioedema (tongue swelling)   Clarithromycin Anaphylaxis    Throat swells   Contrave [Naltrexone-Bupropion Hcl Er]     Chest and sob   Levaquin [Levofloxacin] Other (See Comments)    Tendon pain - shoulder and calf   Family History  Problem Relation Age of Onset   Coronary artery disease Mother    Heart disease Mother    Diabetes Mother    Hypertension Mother    Stroke Mother    Alcohol abuse Father    COPD Maternal Grandmother    Hypertension Other    Hyperlipidemia Other    Colon cancer Neg Hx    Rectal cancer Neg Hx    Stomach cancer Neg Hx    Esophageal cancer Neg Hx    Breast cancer Neg Hx      Current Outpatient Medications (Cardiovascular):    hydrochlorothiazide (HYDRODIURIL) 25 MG tablet, Take 1 tablet (25 mg total) by mouth daily.   losartan (COZAAR) 50 MG tablet, TAKE 1 TABLET BY MOUTH EVERY DAY   metoprolol tartrate (LOPRESSOR) 50 MG tablet, TAKE 1 TABLET BY MOUTH TWICE A DAY  Current Outpatient Medications (Respiratory):    albuterol (VENTOLIN HFA) 108 (90 Base) MCG/ACT inhaler, Inhale 2 puffs into the lungs every 6 (six) hours as needed for wheezing or shortness of breath.   Azelastine HCl 137 MCG/SPRAY SOLN, PLACE 2 SPRAYS INTO BOTH NOSTRILS 2 (TWO) TIMES DAILY. USE IN EACH NOSTRIL AS  DIRECTED   budesonide-formoterol (SYMBICORT) 160-4.5 MCG/ACT inhaler, Inhale 2 puffs into the lungs 2 (two) times daily.   fluticasone (FLONASE) 50 MCG/ACT nasal spray, SHAKE LIQUID AND USE 2 SPRAYS IN EACH NOSTRIL DAILY   ipratropium (ATROVENT) 0.06 % nasal spray, Place 2 sprays into both nostrils 4 (four) times daily as needed (nasal congestion/drainage).   loratadine (CLARITIN) 10 MG tablet, Take 10 mg by mouth daily.   Current Outpatient Medications (Hematological):    ferrous gluconate (FERGON) 324 MG tablet, Take 1 tablet (324 mg total) by mouth 2 (two) times daily with a meal. (Patient taking differently:  Take 324 mg by mouth 3 (three) times a week.)   XARELTO 20 MG TABS tablet, TAKE 1 TABLET(20 MG) BY MOUTH DAILY WITH SUPPER  Current Outpatient Medications (Other):    ALPRAZolam (XANAX) 0.5 MG tablet, Take 1 tablet (0.5 mg total) by mouth 2 (two) times daily as needed for anxiety or sleep.   azithromycin (ZITHROMAX) 250 MG tablet, Take two tabs the first day and then one tab daily for four days   b complex vitamins tablet, Take 1 tablet by mouth daily.   BD PEN NEEDLE MICRO U/F 32G X 6 MM MISC, Inject into the skin.   COPAXONE 40 MG/ML SOSY, INJECT ONE SYRINGE SUBCUTANEOUSLY THREE TIMES PER WEEK AT LEAST 48 HOURS APART. ALLOW SYRINGE TO WARM TO ROOM TEMPERATURE FOR 20 MINUTES. REFRIGERATE.   gabapentin (NEURONTIN) 100 MG capsule, TAKE 3 CAPSULES BY MOUTH 3 TIMES DAILY.   KLOR-CON M10 10 MEQ tablet, TAKE 4 TABLETS BY MOUTH EVERY DAY   LINZESS 145 MCG CAPS capsule, TAKE 1 CAPSULE BY MOUTH EVERY DAY BEFORE BREAKFAST   pantoprazole (PROTONIX) 40 MG tablet, TAKE 1 TABLET BY MOUTH EVERY DAY   PENNSAID 2 % SOLN, APPLY 2 PUMPS (2 GRAMS) TO AFFECTED AREA TOPICALLY TWICE DAILY AS DIRECTED   Tart Cherry 1200 MG CAPS, Take by mouth.   triamcinolone cream (KENALOG) 0.1 %, Apply 1 application topically 2 (two) times daily.   TURMERIC PO, Take by mouth daily.   valACYclovir (VALTREX) 500 MG tablet,  Take 500 mg by mouth 2 (two) times daily.   VITAMIN D PO, Take 2,000 Units by mouth daily.   Reviewed prior external information including notes and imaging from  primary care provider As well as notes that were available from care everywhere and other healthcare systems.  Past medical history, social, surgical and family history all reviewed in electronic medical record.  No pertanent information unless stated regarding to the chief complaint.   Review of Systems:  No headache, visual changes, nausea, vomiting, diarrhea, constipation, dizziness, abdominal pain, skin rash, fevers, chills, night sweats, weight loss, swollen lymph nodes, body aches, joint swelling, chest pain, shortness of breath, mood changes. POSITIVE muscle aches  Objective  Blood pressure 126/74, pulse 71, height '5\' 3"'$  (1.6 m), weight 217 lb (98.4 kg), last menstrual period 11/01/2012, SpO2 96 %.   General: No apparent distress alert and oriented x3 mood and affect normal, dressed appropriately.  HEENT: Pupils equal, extraocular movements intact  Respiratory: Patient's speak in full sentences and does not appear short of breath  Cardiovascular: No lower extremity edema, non tender, no erythema  Patient does have some tenderness to palpation in the hand.  Seems to have a trigger nodule at the A2 pulley of the hand right index finger.  Triggering noted with flexion extension.  Procedure: Real-time Ultrasound Guided Injection of right flexor tendon sheath Device: GE Logiq Q7 Ultrasound guided injection is preferred based studies that show increased duration, increased effect, greater accuracy, decreased procedural pain, increased response rate, and decreased cost with ultrasound guided versus blind injection.  Verbal informed consent obtained.  Time-out conducted.  Noted no overlying erythema, induration, or other signs of local infection.  Skin prepped in a sterile fashion.  Local anesthesia: Topical Ethyl chloride.   With sterile technique and under real time ultrasound guidance: With a 25-gauge half inch needle injecting 0.5 cc of 0.5% Marcaine and 0.5 cc of Kenalog 40 mg/mL Completed without difficulty  Advised to call if fevers/chills, erythema, induration, drainage, or persistent  bleeding.  Impression: Technically successful ultrasound guided injection.    Impression and Recommendations:    The above documentation has been reviewed and is accurate and complete Lyndal Pulley, DO

## 2022-06-28 ENCOUNTER — Other Ambulatory Visit: Payer: Self-pay | Admitting: *Deleted

## 2022-06-28 MED ORDER — HYDROCHLOROTHIAZIDE 25 MG PO TABS: 25.0000 mg | ORAL_TABLET | Freq: Every day | ORAL | 1 refills | Status: DC

## 2022-06-28 NOTE — Telephone Encounter (Signed)
Pt states [facility] has been sending request to have her HCTZ refill. Pharmacy haven't receive back. Inform pt per chart have not received request, but will approve and send to [facility]

## 2022-06-29 ENCOUNTER — Encounter: Payer: Self-pay | Admitting: Family Medicine

## 2022-06-29 ENCOUNTER — Ambulatory Visit: Payer: Self-pay

## 2022-06-29 ENCOUNTER — Ambulatory Visit: Payer: BC Managed Care – PPO | Admitting: Family Medicine

## 2022-06-29 VITALS — BP 126/74 | HR 71 | Ht 63.0 in | Wt 217.0 lb

## 2022-06-29 DIAGNOSIS — M65321 Trigger finger, right index finger: Secondary | ICD-10-CM

## 2022-06-29 DIAGNOSIS — M79644 Pain in right finger(s): Secondary | ICD-10-CM | POA: Diagnosis not present

## 2022-06-29 NOTE — Patient Instructions (Addendum)
Injection in finger today Good to see you! Can cancel appointment if doing well

## 2022-06-29 NOTE — Assessment & Plan Note (Signed)
Patient given injection, this is worsening than previous examinations.  Exacerbation causing more locking and affecting daily activities.  Patient hopefully will respond well to the injection.  Discussed bracing at night.  Follow-up again in 2 to 3 months

## 2022-07-04 ENCOUNTER — Ambulatory Visit: Payer: BC Managed Care – PPO | Admitting: Internal Medicine

## 2022-07-27 ENCOUNTER — Encounter: Payer: Self-pay | Admitting: Internal Medicine

## 2022-07-27 MED ORDER — LOSARTAN POTASSIUM 50 MG PO TABS: 50.0000 mg | ORAL_TABLET | Freq: Every day | ORAL | 1 refills | Status: DC

## 2022-08-03 ENCOUNTER — Encounter: Payer: Self-pay | Admitting: Internal Medicine

## 2022-08-03 NOTE — Progress Notes (Signed)
Subjective:    Patient ID: [name] [name], female    DOB: Mar 20, 1961, 61 y.o.   MRN: [id number]      HPI Tonni is here for  Chief Complaint  Patient presents with   Sinusitis    Nasal drainage and sinus headache (no cough or sore throat)     Her symptoms started over a week ago.  She has a headache behind her left eye, teeth hurt, some drainage, nasal congestion    Medications and allergies reviewed with patient and updated if appropriate.  Current Outpatient Medications on File Prior to Visit  Medication Sig Dispense Refill   albuterol (VENTOLIN HFA) 108 (90 Base) MCG/ACT inhaler Inhale 2 puffs into the lungs every 6 (six) hours as needed for wheezing or shortness of breath. 8 g 5   ALPRAZolam (XANAX) 0.5 MG tablet Take 1 tablet (0.5 mg total) by mouth 2 (two) times daily as needed for anxiety or sleep. 30 tablet 0   Azelastine HCl 137 MCG/SPRAY SOLN PLACE 2 SPRAYS INTO BOTH NOSTRILS 2 (TWO) TIMES DAILY. USE IN EACH NOSTRIL AS DIRECTED 30 mL 2   azithromycin (ZITHROMAX) 250 MG tablet Take two tabs the first day and then one tab daily for four days 6 tablet 0   b complex vitamins tablet Take 1 tablet by mouth daily.     BD PEN NEEDLE MICRO U/F 32G X 6 MM MISC Inject into the skin.     budesonide-formoterol (SYMBICORT) 160-4.5 MCG/ACT inhaler Inhale 2 puffs into the lungs 2 (two) times daily. 10.2 g 5   COPAXONE 40 MG/ML SOSY INJECT ONE SYRINGE SUBCUTANEOUSLY THREE TIMES PER WEEK AT LEAST 48 HOURS APART. ALLOW SYRINGE TO WARM TO ROOM TEMPERATURE FOR 20 MINUTES. REFRIGERATE. 36 mL 2   ferrous gluconate (FERGON) 324 MG tablet Take 1 tablet (324 mg total) by mouth 2 (two) times daily with a meal. (Patient taking differently: Take 324 mg by mouth 3 (three) times a week.) 180 tablet 3   fluticasone (FLONASE) 50 MCG/ACT nasal spray SHAKE LIQUID AND USE 2 SPRAYS IN EACH NOSTRIL DAILY 48 mL 2   gabapentin (NEURONTIN) 100 MG capsule TAKE 3 CAPSULES BY MOUTH 3 TIMES DAILY. 810  capsule 1   hydrochlorothiazide (HYDRODIURIL) 25 MG tablet Take 1 tablet (25 mg total) by mouth daily. 90 tablet 1   ipratropium (ATROVENT) 0.06 % nasal spray Place 2 sprays into both nostrils 4 (four) times daily as needed (nasal congestion/drainage). 15 mL 5   KLOR-CON M10 10 MEQ tablet TAKE 4 TABLETS BY MOUTH EVERY DAY 360 tablet 1   LINZESS 145 MCG CAPS capsule TAKE 1 CAPSULE BY MOUTH EVERY DAY BEFORE BREAKFAST 90 capsule 1   loratadine (CLARITIN) 10 MG tablet Take 10 mg by mouth daily.     losartan (COZAAR) 50 MG tablet Take 1 tablet (50 mg total) by mouth daily. 90 tablet 1   metoprolol tartrate (LOPRESSOR) 50 MG tablet TAKE 1 TABLET BY MOUTH TWICE A DAY 180 tablet 2   pantoprazole (PROTONIX) 40 MG tablet TAKE 1 TABLET BY MOUTH EVERY DAY 90 tablet 1   PENNSAID 2 % SOLN APPLY 2 PUMPS (2 GRAMS) TO AFFECTED AREA TOPICALLY TWICE DAILY AS DIRECTED 112 g 3   Tart Cherry 1200 MG CAPS Take by mouth.     triamcinolone cream (KENALOG) 0.1 % Apply 1 application topically 2 (two) times daily. 30 g 0   TURMERIC PO Take by mouth daily.     valACYclovir (VALTREX) 500 MG tablet  Take 500 mg by mouth 2 (two) times daily.     VITAMIN D PO Take 2,000 Units by mouth daily.     XARELTO 20 MG TABS tablet TAKE 1 TABLET(20 MG) BY MOUTH DAILY WITH SUPPER 90 tablet 1   No current facility-administered medications on file prior to visit.    Review of Systems  Constitutional:  Negative for chills and fever.  HENT:  Positive for congestion, postnasal drip and sinus pressure. Negative for ear pain and sore throat.        Teeth pain  Respiratory:  Negative for cough, shortness of breath and wheezing.   Neurological:  Positive for dizziness and headaches. Negative for light-headedness.       Objective:   Vitals:   08/04/22 0752  BP: 120/78  Pulse: 75  Temp: 98.6 F (37 C)  SpO2: 98%   BP Readings from Last 3 Encounters:  08/04/22 120/78  06/29/22 126/74  05/11/22 126/76   Wt Readings from Last 3  Encounters:  08/04/22 217 lb (98.4 kg)  06/29/22 217 lb (98.4 kg)  05/10/22 217 lb (98.4 kg)   Body mass index is 38.44 kg/m.    Physical Exam Constitutional:      General: She is not in acute distress.    Appearance: Normal appearance. She is not ill-appearing.  HENT:     Head: Normocephalic and atraumatic.     Right Ear: Tympanic membrane, ear canal and external ear normal.     Left Ear: Tympanic membrane, ear canal and external ear normal.     Mouth/Throat:     Mouth: Mucous membranes are moist.     Pharynx: No oropharyngeal exudate or posterior oropharyngeal erythema.  Eyes:     Conjunctiva/sclera: Conjunctivae normal.  Cardiovascular:     Rate and Rhythm: Normal rate and regular rhythm.  Pulmonary:     Effort: Pulmonary effort is normal. No respiratory distress.     Breath sounds: Normal breath sounds. No wheezing or rales.  Musculoskeletal:     Cervical back: Neck supple. No tenderness.  Lymphadenopathy:     Cervical: No cervical adenopathy.  Skin:    General: Skin is warm and dry.  Neurological:     Mental Status: She is alert.            Assessment & Plan:    See Problem List for Assessment and Plan of chronic medical problems.

## 2022-08-04 ENCOUNTER — Ambulatory Visit (INDEPENDENT_AMBULATORY_CARE_PROVIDER_SITE_OTHER): Payer: BC Managed Care – PPO | Admitting: Internal Medicine

## 2022-08-04 VITALS — BP 120/78 | HR 75 | Temp 98.6°F | Ht 63.0 in | Wt 217.0 lb

## 2022-08-04 DIAGNOSIS — J0100 Acute maxillary sinusitis, unspecified: Secondary | ICD-10-CM

## 2022-08-04 MED ORDER — AZITHROMYCIN 250 MG PO TABS: ORAL_TABLET | ORAL | 0 refills | Status: DC

## 2022-08-04 NOTE — Patient Instructions (Addendum)
      Medications changes include :   zpak      Return if symptoms worsen or fail to improve.

## 2022-08-04 NOTE — Assessment & Plan Note (Signed)
Acute Likely bacterial  Start Zpak otc cold medications Rest, fluid Call if no improvement

## 2022-08-25 ENCOUNTER — Telehealth: Payer: Self-pay | Admitting: *Deleted

## 2022-08-25 MED ORDER — FLUCONAZOLE 150 MG PO TABS: 150.0000 mg | ORAL_TABLET | Freq: Once | ORAL | 0 refills | Status: AC

## 2022-08-25 NOTE — Telephone Encounter (Signed)
Pt states MD gave her antibiotics at her last visit. Now she need diflucan sent to pharmacy.Marland KitchenJohny Chess

## 2022-08-31 ENCOUNTER — Ambulatory Visit: Payer: BC Managed Care – PPO | Admitting: Family Medicine

## 2022-09-04 ENCOUNTER — Telehealth: Payer: Self-pay

## 2022-09-04 NOTE — Telephone Encounter (Signed)
PA approved for Saxenda.

## 2022-09-22 ENCOUNTER — Other Ambulatory Visit: Payer: Self-pay | Admitting: Internal Medicine

## 2022-09-26 ENCOUNTER — Encounter (HOSPITAL_BASED_OUTPATIENT_CLINIC_OR_DEPARTMENT_OTHER): Payer: Self-pay | Admitting: Obstetrics

## 2022-09-26 NOTE — Progress Notes (Signed)
Called via phone left message for OR scheduler for Dr D. Clark.  Requesting  cardiac clearance for pt's blood thinner , xarelto, pt takes for PAFlutter.  Pt has surgery scheduled for 09-29-2022 @ [facility].  Pt's xarelto is managed by her cardiologist,  Dr Fransico Him, and last office visit was 01-13-2021.  Call back number and fax number given.

## 2022-09-27 ENCOUNTER — Encounter (HOSPITAL_BASED_OUTPATIENT_CLINIC_OR_DEPARTMENT_OTHER): Payer: Self-pay | Admitting: Obstetrics

## 2022-09-27 ENCOUNTER — Encounter: Payer: Self-pay | Admitting: Cardiology

## 2022-09-27 ENCOUNTER — Telehealth: Payer: Self-pay | Admitting: Cardiology

## 2022-09-27 ENCOUNTER — Ambulatory Visit: Payer: BC Managed Care – PPO | Attending: Cardiology | Admitting: Cardiology

## 2022-09-27 VITALS — BP 122/80 | HR 74 | Ht 63.0 in | Wt 216.4 lb

## 2022-09-27 DIAGNOSIS — I10 Essential (primary) hypertension: Secondary | ICD-10-CM | POA: Diagnosis not present

## 2022-09-27 DIAGNOSIS — G4733 Obstructive sleep apnea (adult) (pediatric): Secondary | ICD-10-CM

## 2022-09-27 NOTE — Patient Instructions (Signed)
Medication Instructions:  Your physician recommends that you continue on your current medications as directed. Please refer to the Current Medication list given to you today.  *If you need a refill on your cardiac medications before your next appointment, please call your pharmacy*  Lab Work: NONE  Testing/Procedures: NONE  Follow-Up: As needed.  Important Information About Sugar

## 2022-09-27 NOTE — Telephone Encounter (Signed)
   Pre-operative Risk Assessment    Patient Name: [name] [name]  DOB: 1961-05-09 MRN: [id number]      Request for Surgical Clearance    Procedure:   D&C Hysteroscopy  Date of Surgery:  Clearance 09/29/22                                 Surgeon:  Dr. Guadalupe Maple Surgeon's Group or Practice Name:  [facility]  Phone number:  [phone] [place]  Fax number:  [phone]   Type of Clearance Requested:   - Pharmacy:  Hold Rivaroxaban (Xarelto) -Medical   Type of Anesthesia:  General    Additional requests/questions:   Caller stated patient needs to be medically cleared for this surgery.  Patient has appointment this morning (1/10) at 8:00am.  Signed, Heloise Beecham   09/27/2022, 8:06 AM

## 2022-09-27 NOTE — Progress Notes (Addendum)
Spoke w/ via phone for pre-op interview--- pt Lab needs dos----   [employer] results------ current EKG in epic/ chart COVID test -----patient states asymptomatic no test needed Arrive at ------- 1000 on 09-29-2022 NPO after MN NO Solid Food.  Clear liquids from MN until--- 0900 Med rec completed Medications to take morning of surgery ----- gabapentin, protonix, lopressor, azelastine spray Diabetic medication ----- n/a Patient instructed no nail polish to be worn day of surgery Patient instructed to bring photo id and insurance card day of surgery Patient aware to have Driver (ride ) / caregiver    for 24 hours after surgery -- son, antonio Patient Special Instructions ----- asked pt to bring both inhalers dos Pre-Op special Istructions -----  telephone cardiac clearance has been requested for xarelto by Dr Carlis Abbott office.  Pt had seen cardiologist today , Dr Radford Pax, awaiting for completion. Patient verbalized understanding of instructions that were given at this phone interview. Patient denies shortness of breath, chest pain, fever, cough at this phone interview.  Anesthesia Review:  HTN;  atypical PAflutter;  mild persistance asthma;  mild osa per pt has not used cpap since 2022 and was told by Dr Radford Pax this was ok;  MS relapsing / remitting  PCP: Dr Jenness Corner (lov 08-04-2022) Cardiologist :  Dr T. Turner  (lov 09-27-2022) Chest x-ray :  no EKG : 09-27-2022 Echo : 03-27-2019 Stress test: no Cardiac Cath : no Activity level: denies sob w/ any activity Sleep Study/ CPAP : yes/ no  Blood Thinner/ Instructions Maryjane Hurter Dose: xarelto ASA / Instructions/ Last Dose : no Awaiting cardiac clearance for instructions.  Pt stated Dr Carlis Abbott told not to take the night before surgery.

## 2022-09-27 NOTE — Telephone Encounter (Signed)
Patient with diagnosis of afib on Xarelto for anticoagulation.    Procedure:  D&C Hysteroscopy  Date of procedure: 09/29/22   CHA2DS2-VASc Score = 2   This indicates a 2.2% annual risk of stroke. The patient's score is based upon: CHF History: 0 HTN History: 1 Diabetes History: 0 Stroke History: 0 Vascular Disease History: 0 Age Score: 0 Gender Score: 1       CrCl 80 ml/min  Per office protocol, patient can hold Xarelto for 2 days prior to procedure.    Please ensure patient did not take Xarelto today.  **This guidance is not considered finalized until pre-operative APP has relayed final recommendations.**

## 2022-09-27 NOTE — Progress Notes (Signed)
Date:  09/27/2022   ID:  [name] [name], DOB Jun 18, 1961, MRN [id number]  PCP:  Binnie Rail, MD  Cardiologist:  Fransico Him, MD  Electrophysiologist:  None   Chief Complaint:  OSA,  HTN  History of Present Illness:    [name] [name] is a 62 y.o. female  with a hx of MS, insomnia, RLS, obesity and HTN and PAFlutter.  She is on Xarelto for a CHADS2VASC score of 2.  A sleep study was ordered and she underwent PSG showing mild OSA with an AHI of 8 and is now on CPAP.  She started having problems with severe bloating that would last for days at a time.  I added a chin strap and set her on 8cm H2O.    Unfortunately she has not been able to use her CPAP device.  She wakes up very congestion in the am.  She has seen ENT and they recommended using Flonase and nasal saline spray.  She uses the Flonase but not the saline.  She says that the next morning she is very congestion in her head and her nose starts running.  She feels rested in the am and has no daytime sleepiness and does nap at all.  She does not wake herselp up snoring or gasping for breath.   Prior CV studies:   The following studies were reviewed today:  PAP compliance download  Past Medical History:  Diagnosis Date   Allergic rhinitis due to other allergen    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   Breast cancer of upper-outer quadrant of right female breast ([place]) 10/23/2014   s/p r mastectomy, neoadj chemo completed 04/21/15; neg genetic testing   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   Chronic constipation    Chronic fatigue 06/08/2016   Conjunctiva disorder 08/06/2018   Depression 09/03/2016   GAD (generalized anxiety disorder)    GERD    Herpes zoster without complication 17/49/4496   History of angioedema    ACE inhibitors   Hx of adenomatous colonic polyps 05/2014   Hyperlipidemia    Hypertension    Lactose intolerance    Migraine headache    MS (multiple sclerosis) ([place])  11/03/2015   PMB (postmenopausal bleeding)    Sleep apnea    Past Surgical History:  Procedure Laterality Date   COLONOSCOPY     PORT-A-CATH REMOVAL Left 05/13/2015   Procedure: REMOVAL PORT-A-CATH;  Surgeon: [name], MD;  Location: [facility];  Service: General;  Laterality: Left;   PORTACATH PLACEMENT Left 11/06/2014   Procedure: INSERTION PORT-A-CATH;  Surgeon: [name], MD;  Location: [facility];  Service: General;  Laterality: Left;   SIMPLE MASTECTOMY WITH AXILLARY SENTINEL NODE BIOPSY Right 05/13/2015   Procedure: RIGHT TOTAL  MASTECTOMY WITH RIGHT  AXILLARY SENTINEL NODE BIOPSY;  Surgeon: [name], MD;  Location: [facility];  Service: General;  Laterality: Right;   TUBAL LIGATION       Current Meds  Medication Sig   albuterol (VENTOLIN HFA) 108 (90 Base) MCG/ACT inhaler Inhale 2 puffs into the lungs every 6 (six) hours as needed for wheezing or shortness of breath.   ALPRAZolam (XANAX) 0.5 MG tablet Take 1 tablet (0.5 mg total) by mouth 2 (two) times daily as needed for anxiety or sleep.   Azelastine HCl 137 MCG/SPRAY SOLN PLACE 2 SPRAYS INTO BOTH NOSTRILS 2 (TWO) TIMES DAILY. USE IN EACH NOSTRIL AS DIRECTED   azithromycin (ZITHROMAX) 250 MG tablet Take two tabs the first day and  then one tab daily for four days   b complex vitamins tablet Take 1 tablet by mouth daily.   BD PEN NEEDLE MICRO U/F 32G X 6 MM MISC Inject into the skin.   budesonide-formoterol (SYMBICORT) 160-4.5 MCG/ACT inhaler Inhale 2 puffs into the lungs 2 (two) times daily.   COPAXONE 40 MG/ML SOSY INJECT ONE SYRINGE SUBCUTANEOUSLY THREE TIMES PER WEEK AT LEAST 48 HOURS APART. ALLOW SYRINGE TO WARM TO ROOM TEMPERATURE FOR 20 MINUTES. REFRIGERATE.   ferrous gluconate (FERGON) 324 MG tablet Take 1 tablet (324 mg total) by mouth 2 (two) times daily with a meal.   fluticasone (FLONASE) 50 MCG/ACT nasal spray SHAKE LIQUID AND USE 2 SPRAYS IN EACH NOSTRIL DAILY   gabapentin (NEURONTIN)  100 MG capsule TAKE 3 CAPSULES BY MOUTH 3 TIMES DAILY.   hydrochlorothiazide (HYDRODIURIL) 25 MG tablet Take 1 tablet (25 mg total) by mouth daily.   ipratropium (ATROVENT) 0.06 % nasal spray Place 2 sprays into both nostrils 4 (four) times daily as needed (nasal congestion/drainage).   KLOR-CON M10 10 MEQ tablet TAKE 4 TABLETS BY MOUTH EVERY DAY   LINZESS 145 MCG CAPS capsule TAKE 1 CAPSULE BY MOUTH EVERY DAY BEFORE BREAKFAST   loratadine (CLARITIN) 10 MG tablet Take 10 mg by mouth daily.   losartan (COZAAR) 50 MG tablet Take 1 tablet (50 mg total) by mouth daily.   metoprolol tartrate (LOPRESSOR) 50 MG tablet TAKE 1 TABLET BY MOUTH TWICE A DAY   pantoprazole (PROTONIX) 40 MG tablet TAKE 1 TABLET BY MOUTH EVERY DAY   PENNSAID 2 % SOLN APPLY 2 PUMPS (2 GRAMS) TO AFFECTED AREA TOPICALLY TWICE DAILY AS DIRECTED   Tart Cherry 1200 MG CAPS Take by mouth.   triamcinolone cream (KENALOG) 0.1 % Apply 1 application topically 2 (two) times daily.   TURMERIC PO Take by mouth daily.   valACYclovir (VALTREX) 500 MG tablet Take 500 mg by mouth 2 (two) times daily.   VITAMIN D PO Take 2,000 Units by mouth daily.   XARELTO 20 MG TABS tablet TAKE 1 TABLET(20 MG) BY MOUTH DAILY WITH SUPPER     Allergies:   Ace inhibitors, Clarithromycin, Contrave [naltrexone-bupropion hcl er], and Levaquin [levofloxacin]   Social History   Tobacco Use   Smoking status: Never   Smokeless tobacco: Never  Vaping Use   Vaping Use: Never used  Substance Use Topics   Alcohol use: No    Alcohol/week: 0.0 standard drinks of alcohol   Drug use: No     Family Hx: The patient's family history includes Alcohol abuse in her father; COPD in her maternal grandmother; Coronary artery disease in her mother; Diabetes in her mother; Heart disease in her mother; Hyperlipidemia in an other family member; Hypertension in her mother and another family member; Stroke in her mother. There is no history of Colon cancer, Rectal cancer,  Stomach cancer, Esophageal cancer, or Breast cancer.  ROS:   Please see the history of present illness.     All other systems reviewed and are negative.   Labs/Other Tests and Data Reviewed:    Recent Labs: 11/09/2021: TSH 2.14 05/10/2022: ALT 17; BUN 12; Creatinine, Ser 0.83; Hemoglobin 13.1; Platelets 257.0; Potassium 4.0; Sodium 138   Recent Lipid Panel Lab Results  Component Value Date/Time   CHOL 202 (H) 11/09/2021 10:32 AM   CHOL 215 (H) 09/20/2020 10:38 AM   TRIG 289.0 (H) 11/09/2021 10:32 AM   HDL 43.70 11/09/2021 10:32 AM   HDL 51 09/20/2020 10:38  AM   CHOLHDL 5 11/09/2021 10:32 AM   LDLCALC 133 (H) 09/20/2020 10:38 AM   LDLDIRECT 111.0 11/09/2021 10:32 AM    Wt Readings from Last 3 Encounters:  09/27/22 216 lb 6.4 oz (98.2 kg)  08/04/22 217 lb (98.4 kg)  06/29/22 217 lb (98.4 kg)     Objective:    Vital Signs:  BP 122/80   Pulse 74   Ht '5\' 3"'$  (1.6 m)   Wt 216 lb 6.4 oz (98.2 kg)   LMP 11/01/2012   SpO2 97%   BMI 38.33 kg/m   GEN: Well nourished, well developed in no acute distress HEENT: Normal NECK: No JVD; No carotid bruits LYMPHATICS: No lymphadenopathy CARDIAC:RRR, no murmurs, rubs, gallops RESPIRATORY:  Clear to auscultation without rales, wheezing or rhonchi  ABDOMEN: Soft, non-tender, non-distended MUSCULOSKELETAL:  No edema; No deformity  SKIN: Warm and dry NEUROLOGIC:  Alert and oriented x 3 PSYCHIATRIC:  Normal affect   EKG perfomed in the office today demonstrates NSR with low voltage QRS in the precordial leads ASSESSMENT & PLAN:    1. OSA  -she has been intolerant to the CPAP therapy due to ongoing sinus issues despite Flonase -her OSA is very mild and she is asymptomatic so I do not think it is imperative that we treat it -she avoids sleeping supine -Continue to work on weight loss, as the link between excess weight  and sleep apnea is well established.   -Remember to establish a good bedtime routine, and work on sleep hygiene.   -Limit daytime naps , avoid stimulants such as caffeine and nicotine close to bedtime, exercise daily to promote sleep quality, avoid heavy , spicy, fried , or rich foods before bed. Ensure adequate exposure to natural light during the day,establish a relaxing bedtime routine with a pleasant sleep environment ( Bedroom between 60 and 67 degrees, turn off bright lights , TV or device screens screens , consider black out curtains or white noise machines) Do not drive if sleepy.  2.  HTN -BP is adequately controlled on exam today -continue Lopressor '50mg'$  BID, HCTZ '25mg'$  daily and Losartan '50mg'$  daily -SCr stable at 0.81 and K+ 4.1 in Jan 2022    Medication Adjustments/Labs and Tests Ordered: Current medicines are reviewed at length with the patient today.  Concerns regarding medicines are outlined above.  Tests Ordered: Orders Placed This Encounter  Procedures   EKG 12-Lead   Medication Changes: No orders of the defined types were placed in this encounter.   Disposition:  Follow up in 1 year(s)  Signed, Fransico Him, MD  09/27/2022 8:20 AM    [facility]

## 2022-09-28 NOTE — Progress Notes (Deleted)
[facility] [place] Phone: [phone] Subjective:    I'm seeing this patient by the request  of:  Binnie Rail, MD  CC:   [id number]  06/29/2022 Patient given injection, this is worsening than previous examinations.  Exacerbation causing more locking and affecting daily activities.  Patient hopefully will respond well to the injection.  Discussed bracing at night.  Follow-up again in 2 to 3 months   Update 10/04/2022 [name] [name] is a 62 y.o. female coming in with complaint of R hand pain. Hysterectomy on 09/29/2022. 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 breast ([place]) 10/23/2014   s/p r mastectomy, neoadj chemo completed 04/21/15; neg genetic testing   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   Chronic constipation    Chronic fatigue 06/08/2016   Conjunctiva disorder 08/06/2018   Depression 09/03/2016   GAD (generalized anxiety disorder)    GERD    Herpes zoster without complication [id number]   History of angioedema    ACE inhibitors   Hx of adenomatous colonic polyps 05/2014   Hyperlipidemia    Hypertension    Lactose intolerance    Migraine headache    Multiple sclerosis, relapsing-remitting ([place]) 11/03/2015   neurologist--- dr Marcha Dutton   PMB (postmenopausal bleeding)    Sleep apnea    Past Surgical History:  Procedure Laterality Date   COLONOSCOPY  2018   PORT-A-CATH REMOVAL Left 05/13/2015   Procedure: REMOVAL PORT-A-CATH;  Surgeon: [name], MD;  Location: [facility];  Service: General;  Laterality: Left;   PORTACATH PLACEMENT Left 11/06/2014   Procedure: INSERTION PORT-A-CATH;  Surgeon: [name], MD;  Location: [facility];   Service: General;  Laterality: Left;   SIMPLE MASTECTOMY WITH AXILLARY SENTINEL NODE BIOPSY Right 05/13/2015   Procedure: RIGHT TOTAL  MASTECTOMY WITH RIGHT  AXILLARY SENTINEL NODE BIOPSY;  Surgeon: [name], MD;  Location: [facility];  Service: General;  Laterality: Right;   TUBAL LIGATION     yrs ago   Social History   Socioeconomic History   Marital status: Single    Spouse name: Not on file   Number of children: 2   Years of education: 12   Highest education level: Not on file  Occupational History   Occupation: Solicitor: [employer]  Tobacco Use   Smoking status: Never   Smokeless tobacco: Never  Vaping Use   Vaping Use: Never used  Substance and Sexual Activity   Alcohol use: No    Alcohol/week: 0.0 standard drinks of alcohol   Drug use: Never   Sexual activity: Not on file  Other Topics Concern   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    Patient is single and lives alone.    Patient is right handed.    Social Determinants of Health   Financial Resource Strain: Not on file  Food Insecurity: Not on file  Transportation Needs: Not on file  Physical Activity: Not on file  Stress: Not on file  Social Connections: Not on file   Allergies  Allergen Reactions   Ace Inhibitors Anaphylaxis  Angioedema (tongue swelling)   Clarithromycin Anaphylaxis    Throat swells   Contrave [Naltrexone-Bupropion Hcl Er] Shortness Of Breath    Chest and sob   Levaquin [Levofloxacin] Other (See Comments)    Tendon pain - shoulder and calf   Family History  Problem Relation Age of Onset   Coronary artery disease Mother    Heart disease Mother    Diabetes Mother    Hypertension Mother    Stroke Mother    Alcohol abuse Father    COPD Maternal Grandmother    Hypertension Other    Hyperlipidemia Other    Colon cancer Neg Hx    Rectal cancer Neg  Hx    Stomach cancer Neg Hx    Esophageal cancer Neg Hx    Breast cancer Neg Hx      Current Outpatient Medications (Cardiovascular):    hydrochlorothiazide (HYDRODIURIL) 25 MG tablet, Take 1 tablet (25 mg total) by mouth daily.   losartan (COZAAR) 50 MG tablet, Take 1 tablet (50 mg total) by mouth daily. (Patient taking differently: Take 50 mg by mouth daily.)   metoprolol tartrate (LOPRESSOR) 50 MG tablet, TAKE 1 TABLET BY MOUTH TWICE A DAY (Patient taking differently: Take 50 mg by mouth 2 (two) times daily.)  Current Outpatient Medications (Respiratory):    albuterol (VENTOLIN HFA) 108 (90 Base) MCG/ACT inhaler, Inhale 2 puffs into the lungs every 6 (six) hours as needed for wheezing or shortness of breath.   Azelastine HCl 137 MCG/SPRAY SOLN, PLACE 2 SPRAYS INTO BOTH NOSTRILS 2 (TWO) TIMES DAILY. USE IN EACH NOSTRIL AS DIRECTED (Patient taking differently: Place 2 sprays into the nose 2 (two) times daily.)   budesonide-formoterol (SYMBICORT) 160-4.5 MCG/ACT inhaler, Inhale 2 puffs into the lungs 2 (two) times daily. (Patient taking differently: Inhale 2 puffs into the lungs 2 (two) times daily as needed.)   fluticasone (FLONASE) 50 MCG/ACT nasal spray, SHAKE LIQUID AND USE 2 SPRAYS IN EACH NOSTRIL DAILY (Patient taking differently: Place 2 sprays into both nostrils daily as needed for allergies or rhinitis. SHAKE LIQUID AND USE 2 SPRAYS IN EACH NOSTRIL DAILY)   ipratropium (ATROVENT) 0.06 % nasal spray, Place 2 sprays into both nostrils 4 (four) times daily as needed (nasal congestion/drainage).   loratadine (CLARITIN) 10 MG tablet, Take 10 mg by mouth daily as needed for itching, rhinitis or allergies.   Current Outpatient Medications (Hematological):    ferrous gluconate (FERGON) 324 MG tablet, Take 1 tablet (324 mg total) by mouth 2 (two) times daily with a meal. (Patient taking differently: Take 324 mg by mouth 3 (three) times a week. PER PT TAKES MON/ WED/ FRIDAY TWICE DAILY)    XARELTO 20 MG TABS tablet, TAKE 1 TABLET(20 MG) BY MOUTH DAILY WITH SUPPER (Patient taking differently: Take 20 mg by mouth at bedtime.)  Current Outpatient Medications (Other):    ALPRAZolam (XANAX) 0.5 MG tablet, Take 1 tablet (0.5 mg total) by mouth 2 (two) times daily as needed for anxiety or sleep.   b complex vitamins tablet, Take 1 tablet by mouth daily.   BD PEN NEEDLE MICRO U/F 32G X 6 MM MISC, Inject into the skin.   Cholecalciferol (VITAMIN D3) 50 MCG (2000 UT) TABS, Take 1 tablet by mouth daily.   COPAXONE 40 MG/ML SOSY, INJECT ONE SYRINGE SUBCUTANEOUSLY THREE TIMES PER WEEK AT LEAST 48 HOURS APART. ALLOW SYRINGE TO WARM TO ROOM TEMPERATURE FOR 20 MINUTES. REFRIGERATE. (Patient taking differently: Inject 40 mg as directed 3 (three) times a  week. INJECT ONE SYRINGE SUBCUTANEOUSLY THREE TIMES PER WEEK AT LEAST 47 HOURS APART. ALLOW SYRINGE TO WARM TO ROOM TEMPERATURE FOR 20 MINUTES. REFRIGERATE.)   gabapentin (NEURONTIN) 100 MG capsule, TAKE 3 CAPSULES BY MOUTH 3 TIMES DAILY. (Patient taking differently: Take 300 mg by mouth 3 (three) times daily.)   KLOR-CON M10 10 MEQ tablet, TAKE 4 TABLETS BY MOUTH EVERY DAY (Patient taking differently: Take 4 mEq by mouth daily.)   LINZESS 145 MCG CAPS capsule, TAKE 1 CAPSULE BY MOUTH EVERY DAY BEFORE BREAKFAST (Patient taking differently: Take 145 mcg by mouth daily before breakfast.)   pantoprazole (PROTONIX) 40 MG tablet, TAKE 1 TABLET BY MOUTH EVERY DAY (Patient taking differently: Take 40 mg by mouth daily.)   Tart Cherry 1200 MG CAPS, Take 1 capsule by mouth at bedtime.   triamcinolone cream (KENALOG) 0.1 %, Apply 1 application topically 2 (two) times daily. (Patient taking differently: Apply 1 application  topically 2 (two) times daily as needed.)   TURMERIC PO, Take 1 capsule by mouth daily.   valACYclovir (VALTREX) 500 MG tablet, Take 500 mg by mouth 2 (two) times daily as needed.   Reviewed prior external information including notes and  imaging from  primary care provider As well as notes that were available from care everywhere and other healthcare systems.  Past medical history, social, surgical and family history all reviewed in electronic medical record.  No pertanent information unless stated regarding to the chief complaint.   Review of Systems:  No headache, visual changes, nausea, vomiting, diarrhea, constipation, dizziness, abdominal pain, skin rash, fevers, chills, night sweats, weight loss, swollen lymph nodes, body aches, joint swelling, chest pain, shortness of breath, mood changes. POSITIVE muscle aches  Objective  Last menstrual period 11/01/2012.   General: No apparent distress alert and oriented x3 mood and affect normal, dressed appropriately.  HEENT: Pupils equal, extraocular movements intact  Respiratory: Patient's speak in full sentences and does not appear short of breath  Cardiovascular: No lower extremity edema, non tender, no erythema      Impression and Recommendations:

## 2022-09-28 NOTE — Telephone Encounter (Signed)
   Primary Cardiologist: Fransico Him, MD  Chart reviewed as part of pre-operative protocol coverage. Given past medical history and time since last visit, based on ACC/AHA guidelines, [name] [name] would be at acceptable risk for the planned procedure without further cardiovascular testing.   Per office protocol, patient can hold Xarelto for 2 days prior to procedure.  Please ensure patient did not take Xarelto on 1/10 and 1/11.  I will route this recommendation to the requesting party via Epic fax function and remove from pre-op pool.  Please call with questions.  Emmaline Life, NP-C  09/28/2022, 12:12 PM [address], [address] Office [phone] Fax [phone]

## 2022-09-28 NOTE — Telephone Encounter (Signed)
Dr. Radford Pax, patient seen by you on 09/27/2022.  I do not see a specific mention of preoperative clearance.  Do you agree that she may proceed with surgery with no additional cardiac testing?  Please forward your response to p cv div preop.  Thank you, Sharyn Lull

## 2022-09-28 NOTE — Telephone Encounter (Signed)
[facility] calling to follow up, because patient's procedure is tomorrow.

## 2022-09-29 ENCOUNTER — Other Ambulatory Visit: Payer: Self-pay

## 2022-09-29 ENCOUNTER — Ambulatory Visit (HOSPITAL_BASED_OUTPATIENT_CLINIC_OR_DEPARTMENT_OTHER)
Admission: RE | Admit: 2022-09-29 | Discharge: 2022-09-29 | Disposition: A | Discharge status: Home / Self Care | Payer: BC Managed Care – PPO | Attending: Obstetrics | Admitting: Obstetrics

## 2022-09-29 ENCOUNTER — Encounter (HOSPITAL_BASED_OUTPATIENT_CLINIC_OR_DEPARTMENT_OTHER): Payer: Self-pay | Admitting: Obstetrics

## 2022-09-29 ENCOUNTER — Ambulatory Visit (HOSPITAL_BASED_OUTPATIENT_CLINIC_OR_DEPARTMENT_OTHER): Payer: BC Managed Care – PPO | Admitting: Anesthesiology

## 2022-09-29 ENCOUNTER — Encounter (HOSPITAL_BASED_OUTPATIENT_CLINIC_OR_DEPARTMENT_OTHER)
Admission: RE | Disposition: A | Discharge status: Home / Self Care | Payer: Self-pay | Source: Home / Self Care | Attending: Obstetrics

## 2022-09-29 DIAGNOSIS — F411 Generalized anxiety disorder: Secondary | ICD-10-CM | POA: Diagnosis not present

## 2022-09-29 DIAGNOSIS — I10 Essential (primary) hypertension: Secondary | ICD-10-CM | POA: Insufficient documentation

## 2022-09-29 DIAGNOSIS — E669 Obesity, unspecified: Secondary | ICD-10-CM | POA: Diagnosis not present

## 2022-09-29 DIAGNOSIS — K219 Gastro-esophageal reflux disease without esophagitis: Secondary | ICD-10-CM | POA: Diagnosis not present

## 2022-09-29 DIAGNOSIS — G4730 Sleep apnea, unspecified: Secondary | ICD-10-CM | POA: Diagnosis not present

## 2022-09-29 DIAGNOSIS — Z6838 Body mass index (BMI) 38.0-38.9, adult: Secondary | ICD-10-CM | POA: Diagnosis not present

## 2022-09-29 DIAGNOSIS — G35 Multiple sclerosis: Secondary | ICD-10-CM | POA: Insufficient documentation

## 2022-09-29 DIAGNOSIS — N840 Polyp of corpus uteri: Secondary | ICD-10-CM | POA: Diagnosis not present

## 2022-09-29 DIAGNOSIS — N950 Postmenopausal bleeding: Secondary | ICD-10-CM | POA: Insufficient documentation

## 2022-09-29 DIAGNOSIS — Z01818 Encounter for other preprocedural examination: Secondary | ICD-10-CM

## 2022-09-29 HISTORY — DX: Hyperlipidemia, unspecified: E78.5

## 2022-09-29 HISTORY — DX: Long term (current) use of anticoagulants: Z79.01

## 2022-09-29 HISTORY — PX: DILATATION & CURETTAGE/HYSTEROSCOPY WITH MYOSURE: SHX6511

## 2022-09-29 HISTORY — DX: Generalized anxiety disorder: F41.1

## 2022-09-29 HISTORY — DX: Postmenopausal bleeding: N95.0

## 2022-09-29 HISTORY — DX: Other constipation: K59.09

## 2022-09-29 HISTORY — DX: Essential (primary) hypertension: I10

## 2022-09-29 HISTORY — DX: Personal history of other specified conditions: Z87.898

## 2022-09-29 LAB — POCT I-STAT, CHEM 8
BUN: 12 mg/dL (ref 6–20)
Calcium, Ion: 1.22 mmol/L (ref 1.15–1.40)
Chloride: 103 mmol/L (ref 98–111)
Creatinine, Ser: 0.7 mg/dL (ref 0.44–1.00)
Glucose, Bld: 96 mg/dL (ref 70–99)
HCT: 40 % (ref 36.0–46.0)
Hemoglobin: 13.6 g/dL (ref 12.0–15.0)
Potassium: 3.9 mmol/L (ref 3.5–5.1)
Sodium: 139 mmol/L (ref 135–145)
TCO2: 23 mmol/L (ref 22–32)

## 2022-09-29 SURGERY — DILATATION & CURETTAGE/HYSTEROSCOPY WITH MYOSURE
Anesthesia: General

## 2022-09-29 MED ORDER — FENTANYL CITRATE (PF) 100 MCG/2ML IJ SOLN
INTRAMUSCULAR | Status: AC
  Filled 2022-09-29: qty 2

## 2022-09-29 MED ORDER — LIDOCAINE 2% (20 MG/ML) 5 ML SYRINGE
INTRAMUSCULAR | Status: DC | PRN
  Administered 2022-09-29: 60 mg via INTRAVENOUS

## 2022-09-29 MED ORDER — MIDAZOLAM HCL 2 MG/2ML IJ SOLN
INTRAMUSCULAR | Status: DC | PRN
  Administered 2022-09-29: 2 mg via INTRAVENOUS

## 2022-09-29 MED ORDER — DEXAMETHASONE SODIUM PHOSPHATE 10 MG/ML IJ SOLN
INTRAMUSCULAR | Status: AC
  Filled 2022-09-29: qty 1

## 2022-09-29 MED ORDER — KETOROLAC TROMETHAMINE 15 MG/ML IJ SOLN
INTRAMUSCULAR | Status: DC | PRN
  Administered 2022-09-29: 15 mg via INTRAVENOUS

## 2022-09-29 MED ORDER — SODIUM CHLORIDE 0.9 % IR SOLN
Status: DC | PRN
  Administered 2022-09-29: 1

## 2022-09-29 MED ORDER — FENTANYL CITRATE (PF) 100 MCG/2ML IJ SOLN
INTRAMUSCULAR | Status: DC | PRN
  Administered 2022-09-29: 50 ug via INTRAVENOUS
  Administered 2022-09-29 (×2): 25 ug via INTRAVENOUS

## 2022-09-29 MED ORDER — PROPOFOL 10 MG/ML IV BOLUS
INTRAVENOUS | Status: DC | PRN
  Administered 2022-09-29: 180 mg via INTRAVENOUS

## 2022-09-29 MED ORDER — ONDANSETRON HCL 4 MG/2ML IJ SOLN
INTRAMUSCULAR | Status: AC
  Filled 2022-09-29: qty 2

## 2022-09-29 MED ORDER — ACETAMINOPHEN 500 MG PO TABS
ORAL_TABLET | ORAL | Status: AC
  Filled 2022-09-29: qty 2

## 2022-09-29 MED ORDER — ACETAMINOPHEN 500 MG PO TABS
1000.0000 mg | ORAL_TABLET | Freq: Once | ORAL | Status: AC
  Administered 2022-09-29: 1000 mg via ORAL

## 2022-09-29 MED ORDER — MIDAZOLAM HCL 2 MG/2ML IJ SOLN
INTRAMUSCULAR | Status: AC
  Filled 2022-09-29: qty 2

## 2022-09-29 MED ORDER — KETOROLAC TROMETHAMINE 30 MG/ML IJ SOLN
INTRAMUSCULAR | Status: AC
  Filled 2022-09-29: qty 1

## 2022-09-29 MED ORDER — POVIDONE-IODINE 10 % EX SWAB: 2.0000 | Freq: Once | CUTANEOUS | Status: DC

## 2022-09-29 MED ORDER — DEXAMETHASONE SODIUM PHOSPHATE 10 MG/ML IJ SOLN
INTRAMUSCULAR | Status: DC | PRN
  Administered 2022-09-29: 5 mg via INTRAVENOUS

## 2022-09-29 MED ORDER — ONDANSETRON HCL 4 MG/2ML IJ SOLN
INTRAMUSCULAR | Status: DC | PRN
  Administered 2022-09-29: 4 mg via INTRAVENOUS

## 2022-09-29 MED ORDER — LACTATED RINGERS IV SOLN: INTRAVENOUS | Status: DC

## 2022-09-29 SURGICAL SUPPLY — 13 items
DEVICE MYOSURE REACH (MISCELLANEOUS) IMPLANT
GLOVE BIOGEL PI IND STRL 6 (GLOVE) ×1 IMPLANT
GLOVE BIOGEL PI IND STRL 6.5 (GLOVE) ×1 IMPLANT
GLOVE BIOGEL PI IND STRL 7.0 (GLOVE) ×1 IMPLANT
GLOVE ECLIPSE 6.0 STRL STRAW (GLOVE) ×1 IMPLANT
GOWN STRL REUS W/TWL LRG LVL3 (GOWN DISPOSABLE) ×1 IMPLANT
IV NS IRRIG 3000ML ARTHROMATIC (IV SOLUTION) ×1 IMPLANT
KIT PROCEDURE FLUENT (KITS) ×1 IMPLANT
KIT TURNOVER CYSTO (KITS) ×1 IMPLANT
PACK VAGINAL MINOR WOMEN LF (CUSTOM PROCEDURE TRAY) ×1 IMPLANT
PAD OB MATERNITY 4.3X12.25 (PERSONAL CARE ITEMS) ×1 IMPLANT
SEAL ROD LENS SCOPE MYOSURE (ABLATOR) ×1 IMPLANT
TOWEL OR 17X26 10 PK STRL BLUE (TOWEL DISPOSABLE) ×1 IMPLANT

## 2022-09-29 NOTE — H&P (Signed)
62 y.o. G2P2 presents for hysteroscopy, D&C for recurrent postmenopausal bleeding.  She underwent a pelvic US initially in May 2023 for an episode of light bleeding a few months prior.  Endometrial thickness was 9.3 mm so endometrial biopsy was performed.  Bleeding recurred in July.  Had planned for hysteroscopy to further evaluate the uterus, but she needed to delay due to a family matter.  Bleeding has since occurred two additional times.   Endometrial biopsy: benign, inactive endometrium Ultrasound showed a retroverted uterus with a solitary posterior right intramural fibroid measuring 3.0 cm in greatest dimension.  Endometrium diffusely thickened at 9.26m.  No increased flow by color doppler.  Ovaries were not well visualized   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 breast ([place] [facility] 10/23/2014   s/p r mastectomy, neoadj chemo completed 04/21/15; neg genetic testing   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   Chronic constipation    Chronic fatigue 06/08/2016   Conjunctiva disorder 08/06/2018   Depression 09/03/2016   GAD (generalized anxiety disorder)    GERD    Herpes zoster without complication 089/21/1941  History of angioedema    ACE inhibitors   Hx of adenomatous colonic polyps 05/2014   Hyperlipidemia    Hypertension    Lactose intolerance    Migraine headache    Multiple sclerosis, [place]-remitting ([facility] 11/03/2015   neurologist--- dr dMarcha Dutton  PMB (postmenopausal bleeding)    Sleep apnea     Past Surgical History:  Procedure Laterality Date   COLONOSCOPY  2018   PORT-A-CATH REMOVAL Left 05/13/2015   Procedure: REMOVAL PORT-A-CATH;  Surgeon: MRolm Bookbinder MD;  [place] [facility]  Service: General;  Laterality: Left;   PORTACATH PLACEMENT Left 11/06/2014    Procedure: INSERTION PORT-A-CATH;  Surgeon: MRolm Bookbinder MD;  [place] [facility]  Service: General;  Laterality: Left;   SIMPLE MASTECTOMY WITH AXILLARY SENTINEL NODE BIOPSY Right 05/13/2015   Procedure: RIGHT TOTAL  MASTECTOMY WITH RIGHT  AXILLARY SENTINEL NODE BIOPSY;  Surgeon: MRolm Bookbinder MD;  [facility] [facility]  Service: General;  Laterality: Right;   TUBAL LIGATION     yrs ago    OB History  Gravida Para Term Preterm AB Living  '2 2 2        '$ SAB IAB Ectopic Multiple Live Births               # Outcome Date GA Lbr Len/2nd Weight Sex Delivery Anes PTL Lv  2 Term           1 Term             Social History   Socioeconomic History   Marital status: Single    Spouse name: Not on file   Number of children: 2   Years of education: 12   Highest education level: Not on file  Occupational History   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    Patient has a Copywriter, advertising.    Patient is single and lives alone.    Patient is right handed.    Social Determinants of Health   Financial Resource Strain: Not on file  Food Insecurity: Not on file  Transportation Needs: Not on file  Physical Activity: Not on file  Stress: Not on file  Social Connections: Not on file  Intimate Partner Violence: Not on file   Ace inhibitors, Clarithromycin, Contrave [naltrexone-bupropion hcl er], and Levaquin [levofloxacin]    Vitals:   09/29/22 1022  BP: (!) 155/84  Pulse: 75  Resp: 16  Temp: 97.6 F (36.4 C)  SpO2: 99%     General:  NAD Abdomen:  soft Pelvic:   Vulva normal, cervix normal, uterus mobile, not enlarged    A/P   62 y.o.  G2P2  presents for hysteroscopy, dilation and curettage for postmenopausal bleeding.   Recurrent PMB. Thickened endometrium on gyn scan in may 2023, but normal EMB. Bleeding then recurred multiple times, never heavy. Needed to defer surgery in fall.  Bleeding has recurred twice since August. Will plan to proceed with diagnostic hysteroscopy, D&C. If endometrial polyp is identified, plan , possible polypectomy. Discussed risks to include infection, bleeding, damage to surrounding structures (including but not limited to vagina, cervix, bladder, uterus), uterine perforation, inability to enter uterine cavity or fully resect polyp or not find a polyp, possible recurrence of polyp, possible recurrence of postmenopausal bleeding, need for additional procedures.  [place]

## 2022-09-29 NOTE — Op Note (Signed)
Operative Note  Pre-operative Diagnosis: post menopausal bleeding  Post-operative Diagnosis: endometrial polyps  Surgeon: Jerelyn Charles, MD  Procedure: hysteroscopy, polypectomy with MyoSure, dilation and curettage  Anesthesia: general  Estimated Blood Loss:  minimal         Drains: none         Specimens: endometrial curettings and polyps to pathology         Findings: Normal retroverted uterus.  Large endometrial polyp filling the entire cavity with a stalk arising from the posterior wall.    Description of Procedure:         After adequate anesthesia was achieved, the patient placed in the dorsal lithotomy position in [place].  She was prepped and draped in the usual sterile fashion.   A bimanual exam revealed a retroverted uterus.  The bivalve speculum was placed in the vagina and the anterior lip of the cervix grasped with a single-tooth tenaculum.  The cervix was serially dilated with Hank dilators to 4m.  Under direct visualization, the hysteroscope was advanced.  The uterine cavity was surveyed. A large endometrial polyp arising from the posterior wall filled the entire cavity.  The MyoSure device was advanced into the uterine cavity.  Under direct visualization, the polyp was resected completely with the MyoSure. Following resection, the endometrium was noted to be thin and atrophic.  A normal cavity was visualized along with both tubal ostia. The endometrium was also globally [facility] The hysteroscope was removed.  The polyps and endometrial curettings were sent to pathology.   All the vaginal instruments were removed.  Counts were correct.  The patient was awakened from anesthesia and transferred to PACU in stable condition.    [facility]

## 2022-09-29 NOTE — Anesthesia Postprocedure Evaluation (Signed)
Anesthesia Post Note  Patient: [name] [name]  Procedure(s) Performed: DILATATION & CURETTAGE/HYSTEROSCOPY WITH [place]     Patient location during evaluation: PACU Anesthesia Type: General Level of consciousness: awake and alert Pain management: pain level controlled Vital Signs Assessment: post-procedure vital signs reviewed and stable Respiratory status: spontaneous breathing, nonlabored ventilation and respiratory function stable Cardiovascular status: stable and blood pressure returned to baseline Anesthetic complications: no   No notable events documented.  Last Vitals:  Vitals:   09/29/22 1300 09/29/22 1315  BP: 119/77 129/64  Pulse: 75 66  Resp: 13 16  Temp:  36.5 C  SpO2: 100% 99%    Last Pain:  Vitals:   09/29/22 1315  TempSrc:   PainSc: 0-No pain                 Audry Pili

## 2022-09-29 NOTE — Transfer of Care (Signed)
Immediate Anesthesia Transfer of Care Note  Patient: [name] [name]  Procedure(s) Performed: Procedure(s) (LRB): DILATATION & CURETTAGE/HYSTEROSCOPY WITH MYOSURE (N/A)  Patient Location: PACU  Anesthesia Type: General  Level of Consciousness: awake, oriented, sedated and patient cooperative  Airway & Oxygen Therapy: Patient Spontanous Breathing and Patient connected to face mask oxygen  Post-op Assessment: Report given to PACU RN and Post -op Vital signs reviewed and stable  Post vital signs: Reviewed and stable  Complications: No apparent anesthesia complications Last Vitals:  Vitals Value Taken Time  BP    Temp    Pulse    Resp    SpO2      Last Pain:  Vitals:   09/29/22 1022  TempSrc: Oral  PainSc: 0-No pain      Patients Stated Pain Goal: 5 (43/73/57 [id number])  Complications: No notable events documented.

## 2022-09-29 NOTE — Anesthesia Preprocedure Evaluation (Addendum)
Anesthesia Evaluation  Patient identified by MRN, date of birth, ID band Patient awake    Reviewed: Allergy & Precautions, NPO status , Patient's Chart, lab work & pertinent test results, reviewed documented beta blocker date and time   History of Anesthesia Complications Negative for: history of anesthetic complications  Airway Mallampati: II  TM Distance: >3 FB Neck ROM: Full    Dental  (+) Dental Advisory Given   Pulmonary asthma , sleep apnea    Pulmonary exam normal        Cardiovascular hypertension, Pt. on medications and Pt. on home beta blockers Normal cardiovascular exam     Neuro/Psych  Headaches PSYCHIATRIC DISORDERS Anxiety Depression     Neuromuscular disease (MS)    GI/Hepatic Neg liver ROS,GERD  Medicated and Controlled,,  Endo/Other   Obesity   Renal/GU negative Renal ROS     Musculoskeletal  (+) Arthritis ,    Abdominal   Peds  Hematology  On xarelto    Anesthesia Other Findings   Reproductive/Obstetrics  Breast cancer                              Anesthesia Physical Anesthesia Plan  ASA: 3  Anesthesia Plan: General   Post-op Pain Management: Tylenol PO (pre-op)*   Induction: Intravenous  PONV Risk Score and Plan: 3 and Treatment may vary due to age or medical condition, Ondansetron, Dexamethasone and Midazolam  Airway Management Planned: LMA  Additional Equipment: None  Intra-op Plan:   Post-operative Plan: Extubation in OR  Informed Consent: I have reviewed the patients History and Physical, chart, labs and discussed the procedure including the risks, benefits and alternatives for the proposed anesthesia with the patient or authorized representative who has indicated his/her understanding and acceptance.     Dental advisory given  Plan Discussed with: CRNA and Anesthesiologist  Anesthesia Plan Comments:        Anesthesia Quick  Evaluation

## 2022-09-29 NOTE — Anesthesia Procedure Notes (Signed)
Procedure Name: LMA Insertion Date/Time: 09/29/2022 12:11 PM  Performed by: Suan Halter, CRNAPre-anesthesia Checklist: Patient identified, Emergency Drugs available, Suction available and Patient being monitored Patient Re-evaluated:Patient Re-evaluated prior to induction Oxygen Delivery Method: Circle system utilized Preoxygenation: Pre-oxygenation with 100% oxygen Induction Type: IV induction Ventilation: Mask ventilation without difficulty LMA: LMA inserted LMA Size: 4.0 Number of attempts: 1 Airway Equipment and Method: Bite block Placement Confirmation: positive ETCO2 Tube secured with: Tape Dental Injury: Teeth and Oropharynx as per pre-operative assessment

## 2022-09-29 NOTE — Discharge Instructions (Addendum)
DISCHARGE INSTRUCTIONS: HYSTEROSCOPY / ENDOMETRIAL ABLATION The following instructions have been prepared to help you care for yourself upon your return home.   May take stool softner while taking narcotic pain medication to prevent constipation.  Drink plenty of water.  Personal hygiene:  Use sanitary pads for vaginal drainage, not tampons.  Shower the day after your procedure.  NO tub baths, pools or Jacuzzis for 2-3 weeks.  Wipe front to back after using the bathroom.  Activity and limitations:  Do NOT drive or operate any equipment for 24 hours. The effects of anesthesia are still present and drowsiness may result.  Do NOT rest in bed all day.  Walking is encouraged.  Walk up and down stairs slowly.  You may resume your normal activity in one to two days or as indicated by your physician. Sexual activity: NO intercourse for at least 2 weeks after the procedure, or as indicated by your Doctor.  Diet: Eat a light meal as desired this evening. You may resume your usual diet tomorrow.  Return to Work: You may resume your work activities in one to two days or as indicated by Marine scientist.  What to expect after your surgery: Expect to have vaginal bleeding/discharge for 2-3 days and spotting for up to 10 days. It is not unusual to have soreness for up to 1-2 weeks. You may have a slight burning sensation when you urinate for the first day. Mild cramps may continue for a couple of days. You may have a regular period in 2-6 weeks.  Call your doctor for any of the following:  Excessive vaginal bleeding or clotting, saturating and changing one pad every hour.  Inability to urinate 6 hours after discharge from hospital.  Pain not relieved by pain medication.  Fever of 100.4 F or greater.  Unusual vaginal discharge or odor.       No acetaminophen/Tylenol until after 4:25 pm today if needed.  No ibuprofen, Advil, Aleve, Motrin, ketorolac, meloxicam, naproxen, or other NSAIDS  until after 6:30 pm today if needed.       Post Anesthesia Home Care Instructions  Activity: Get plenty of rest for the remainder of the day. A responsible individual must stay with you for 24 hours following the procedure.  For the next 24 hours, DO NOT: -Drive a car -Paediatric nurse -Drink alcoholic beverages -Take any medication unless instructed by your physician -Make any legal decisions or sign important papers.  Meals: Start with liquid foods such as gelatin or soup. Progress to regular foods as tolerated. Avoid greasy, spicy, heavy foods. If nausea and/or vomiting occur, drink only clear liquids until the nausea and/or vomiting subsides. Call your physician if vomiting continues.  Special Instructions/Symptoms: Your throat may feel dry or sore from the anesthesia or the breathing tube placed in your throat during surgery. If this causes discomfort, gargle with warm salt water. The discomfort should disappear within 24 hours.

## 2022-10-02 ENCOUNTER — Encounter (HOSPITAL_BASED_OUTPATIENT_CLINIC_OR_DEPARTMENT_OTHER): Payer: Self-pay | Admitting: Obstetrics

## 2022-10-02 LAB — SURGICAL PATHOLOGY

## 2022-10-04 ENCOUNTER — Other Ambulatory Visit: Payer: Self-pay | Admitting: Internal Medicine

## 2022-10-04 ENCOUNTER — Ambulatory Visit: Payer: BC Managed Care – PPO | Admitting: Family Medicine

## 2022-10-04 DIAGNOSIS — Z1231 Encounter for screening mammogram for malignant neoplasm of breast: Secondary | ICD-10-CM

## 2022-10-21 ENCOUNTER — Other Ambulatory Visit: Payer: Self-pay | Admitting: Internal Medicine

## 2022-10-24 ENCOUNTER — Other Ambulatory Visit: Payer: Self-pay | Admitting: Internal Medicine

## 2022-11-03 ENCOUNTER — Telehealth: Payer: Self-pay | Admitting: Cardiology

## 2022-11-03 NOTE — Telephone Encounter (Signed)
[name] [name] is requesting to transfer from Dr. Radford Pax to Dr. Claiborne Billings.  She is aware that Dr. Claiborne Billings will be retiring in the coming years and was accepting of this. Her last visit notes a 1 year follow up due in Jan 2025, however, pt has requested a sooner appointment with Dr. Claiborne Billings because her CPAP currently does not help her. She is aware that his next available sleep visit is 03/14/23 and that we would put her on a wait list should an appointment become available sooner.   Upon Dr. Radford Pax and Dr. Evette Georges approval of this transfer, I will call her back and schedule that visit in June unless, otherwise advised by physician.

## 2022-11-08 ENCOUNTER — Ambulatory Visit (INDEPENDENT_AMBULATORY_CARE_PROVIDER_SITE_OTHER): Payer: BC Managed Care – PPO | Admitting: Family Medicine

## 2022-11-08 ENCOUNTER — Ambulatory Visit: Payer: Self-pay

## 2022-11-08 VITALS — BP 118/82 | HR 82 | Ht 63.0 in | Wt 220.0 lb

## 2022-11-08 DIAGNOSIS — C50411 Malignant neoplasm of upper-outer quadrant of right female breast: Secondary | ICD-10-CM

## 2022-11-08 DIAGNOSIS — M545 Low back pain, unspecified: Secondary | ICD-10-CM

## 2022-11-08 DIAGNOSIS — M25511 Pain in right shoulder: Secondary | ICD-10-CM

## 2022-11-08 DIAGNOSIS — M65331 Trigger finger, right middle finger: Secondary | ICD-10-CM

## 2022-11-08 DIAGNOSIS — M19011 Primary osteoarthritis, right shoulder: Secondary | ICD-10-CM | POA: Diagnosis not present

## 2022-11-08 NOTE — Patient Instructions (Addendum)
Injected shoulder and hand today PT [place] See me again in 2-3 months

## 2022-11-08 NOTE — Progress Notes (Signed)
[name] [name] [place] Phone: [phone] Subjective:   [name] [name], am serving as a scribe for Dr. Hulan Saas.  I'm seeing this patient by the request  of:  Binnie Rail, MD  CC: Right index finger and shoulder pain  [id number]  06/29/2022 Patient given injection, this is worsening than previous examinations.  Exacerbation causing more locking and affecting daily activities.  Patient hopefully will respond well to the injection.  Discussed bracing at night.  Follow-up again in 2 to 3 months     Update 11/08/2022 [name] [name] is a 62 y.o. female coming in with complaint of R index finger trigger finger and R shoulder pain. AC jt injection in 2022. Patient states that her 2nd, 3rd, and 4th fingers have been locking up on her for past 2 months.   Also having an increasing in R shoulder pain. Pain at night. [name] pain right now. [name] neck pain.     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   Chronic constipation    Chronic fatigue 06/08/2016   Conjunctiva disorder 08/06/2018   Depression 09/03/2016   GAD (generalized anxiety disorder)    GERD    Herpes zoster without complication [id number]   History of angioedema    ACE inhibitors   Hx of adenomatous colonic polyps 05/2014   Hyperlipidemia    Hypertension    Lactose intolerance    Migraine headache    Multiple sclerosis, relapsing-remitting ([place]) 11/03/2015   neurologist--- dr Marcha Dutton   PMB (postmenopausal bleeding)    Sleep apnea    Past Surgical History:  Procedure Laterality Date   COLONOSCOPY  2018    DILATATION & CURETTAGE/HYSTEROSCOPY WITH MYOSURE N/A 09/29/2022   Procedure: DILATATION & CURETTAGE/HYSTEROSCOPY WITH MYOSURE;  Surgeon: Jerelyn Charles, MD;  Location: [facility];  Service: Gynecology;  Laterality: N/A;   PORT-A-CATH REMOVAL Left 05/13/2015   Procedure: REMOVAL PORT-A-CATH;  Surgeon: [name], MD;  Location: [facility];  Service: General;  Laterality: Left;   PORTACATH PLACEMENT Left 11/06/2014   Procedure: INSERTION PORT-A-CATH;  Surgeon: [name], MD;  Location: [facility];  Service: General;  Laterality: Left;   SIMPLE MASTECTOMY WITH AXILLARY SENTINEL NODE BIOPSY Right 05/13/2015   Procedure: RIGHT TOTAL  MASTECTOMY WITH RIGHT  AXILLARY SENTINEL NODE BIOPSY;  Surgeon: [name], MD;  Location: [facility];  Service: General;  Laterality: Right;   TUBAL LIGATION     yrs ago   Social History   Socioeconomic History   Marital status: Single    Spouse name: Not on file   Number of children: 2   Years of education: 12   Highest education level: Not on file  Occupational History   Occupation: Solicitor: [employer]  Tobacco Use   Smoking status: Never   Smokeless tobacco: Never  Vaping Use   Vaping Use: Never used  Substance and Sexual Activity   Alcohol use: [name]    Alcohol/week: 0.0 standard drinks of alcohol   Drug use: Never   Sexual activity: Not on file  Other Topics Concern   Not on  file  Social History Narrative   Patient is a Programmer, 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    Patient is single and lives alone.    Patient is right handed.    Social Determinants of Health   Financial Resource Strain: Not on file  Food Insecurity: Not on file  Transportation Needs: Not on file  Physical Activity: Not on file  Stress: Not on file  Social Connections: Not on file   Allergies  Allergen Reactions   Ace Inhibitors Anaphylaxis     Angioedema  (tongue swelling)   Clarithromycin Anaphylaxis    Throat swells   Contrave [Naltrexone-Bupropion Hcl Er] Shortness Of Breath    Chest and sob   Levaquin [Levofloxacin] Other (See Comments)    Tendon pain - shoulder and calf   Family History  Problem Relation Age of Onset   Coronary artery disease Mother    Heart disease Mother    Diabetes Mother    Hypertension Mother    Stroke Mother    Alcohol abuse Father    COPD Maternal Grandmother    Hypertension Other    Hyperlipidemia Other    Colon cancer Neg Hx    Rectal cancer Neg Hx    Stomach cancer Neg Hx    Esophageal cancer Neg Hx    Breast cancer Neg Hx      Current Outpatient Medications (Cardiovascular):    hydrochlorothiazide (HYDRODIURIL) 25 MG tablet, TAKE 1 TABLET (25 MG TOTAL) BY MOUTH DAILY.   losartan (COZAAR) 50 MG tablet, Take 1 tablet (50 mg total) by mouth daily. (Patient taking differently: Take 50 mg by mouth daily.)   metoprolol tartrate (LOPRESSOR) 50 MG tablet, TAKE 1 TABLET BY MOUTH TWICE A DAY (Patient taking differently: Take 50 mg by mouth 2 (two) times daily.)  Current Outpatient Medications (Respiratory):    albuterol (VENTOLIN HFA) 108 (90 Base) MCG/ACT inhaler, Inhale 2 puffs into the lungs every 6 (six) hours as needed for wheezing or shortness of breath.   Azelastine HCl 137 MCG/SPRAY SOLN, PLACE 2 SPRAYS INTO BOTH NOSTRILS 2 (TWO) TIMES DAILY. USE IN EACH NOSTRIL AS DIRECTED (Patient taking differently: Place 2 sprays into the nose 2 (two) times daily.)   budesonide-formoterol (SYMBICORT) 160-4.5 MCG/ACT inhaler, Inhale 2 puffs into the lungs 2 (two) times daily. (Patient taking differently: Inhale 2 puffs into the lungs 2 (two) times daily as needed.)   fluticasone (FLONASE) 50 MCG/ACT nasal spray, SHAKE LIQUID AND USE 2 SPRAYS IN EACH NOSTRIL DAILY   ipratropium (ATROVENT) 0.06 % nasal spray, Place 2 sprays into both nostrils 4 (four) times daily as needed (nasal congestion/drainage).   loratadine  (CLARITIN) 10 MG tablet, Take 10 mg by mouth daily as needed for itching, rhinitis or allergies.   Current Outpatient Medications (Hematological):    ferrous gluconate (FERGON) 324 MG tablet, Take 1 tablet (324 mg total) by mouth 2 (two) times daily with a meal. (Patient taking differently: Take 324 mg by mouth 3 (three) times a week. PER PT TAKES MON/ WED/ FRIDAY TWICE DAILY)   XARELTO 20 MG TABS tablet, TAKE 1 TABLET(20 MG) BY MOUTH DAILY WITH SUPPER  Current Outpatient Medications (Other):    ALPRAZolam (XANAX) 0.5 MG tablet, Take 1 tablet (0.5 mg total) by mouth 2 (two) times daily as needed for anxiety or sleep.   b complex vitamins tablet, Take 1 tablet by mouth daily.   Cholecalciferol (VITAMIN D3) 50 MCG (2000 UT) TABS, Take 1  tablet by mouth daily.   COPAXONE 40 MG/ML SOSY, INJECT ONE SYRINGE SUBCUTANEOUSLY THREE TIMES PER WEEK AT LEAST 48 HOURS APART. ALLOW SYRINGE TO WARM TO ROOM TEMPERATURE FOR 20 MINUTES. REFRIGERATE. (Patient taking differently: Inject 40 mg as directed 3 (three) times a week. INJECT ONE SYRINGE SUBCUTANEOUSLY THREE TIMES PER WEEK AT LEAST 77 HOURS APART. ALLOW SYRINGE TO WARM TO ROOM TEMPERATURE FOR 20 MINUTES. REFRIGERATE.)   gabapentin (NEURONTIN) 100 MG capsule, TAKE 3 CAPSULES BY MOUTH 3 TIMES DAILY. (Patient taking differently: Take 300 mg by mouth 3 (three) times daily.)   KLOR-CON M10 10 MEQ tablet, TAKE 4 TABLETS BY MOUTH EVERY DAY (Patient taking differently: Take 4 mEq by mouth daily.)   LINZESS 145 MCG CAPS capsule, TAKE 1 CAPSULE BY MOUTH EVERY DAY BEFORE BREAKFAST (Patient taking differently: Take 145 mcg by mouth daily before breakfast.)   pantoprazole (PROTONIX) 40 MG tablet, TAKE 1 TABLET BY MOUTH EVERY DAY (Patient taking differently: Take 40 mg by mouth daily.)   Tart Cherry 1200 MG CAPS, Take 1 capsule by mouth at bedtime.   TURMERIC PO, Take 1 capsule by mouth daily.   valACYclovir (VALTREX) 500 MG tablet, Take 500 mg by mouth 2 (two) times daily  as needed.   Reviewed prior external information including notes and imaging from  primary care provider As well as notes that were available from care everywhere and other healthcare systems.  Past medical history, social, surgical and family history all reviewed in electronic medical record.  [name] pertanent information unless stated regarding to the chief complaint.   Review of Systems:  [name] headache, visual changes, nausea, vomiting, diarrhea, constipation, dizziness, abdominal pain, skin rash, fevers, chills, night sweats, weight loss, swollen lymph nodes, body aches, joint swelling, chest pain, shortness of breath, mood changes. POSITIVE muscle aches  Objective  Blood pressure 118/82, pulse 82, height 5' 3"$  (1.6 m), weight 220 lb (99.8 kg), last menstrual period 11/01/2012, SpO2 98 %.   General: [name] apparent distress alert and oriented x3 mood and affect normal, dressed appropriately.  HEENT: Pupils equal, extraocular movements intact  Respiratory: Patient's speak in full sentences and does not appear short of breath  Cardiovascular: [name] lower extremity edema, non tender, [name] erythema  Right shoulder has positive crossover test noted.  Rotator cuff strength appears to be about patient's baseline.  Still has some mild crepitus with range of motion noted.  Patient's right hand shows the patient does have a trigger nodule at the A2 pulley of the middle finger.  [name] significant though difficulty with flexion today.  Procedure: Real-time Ultrasound Guided Injection of right middle flexor tendon sheath Device: GE Logiq Q7 Ultrasound guided injection is preferred based studies that show increased duration, increased effect, greater accuracy, decreased procedural pain, increased response rate, and decreased cost with ultrasound guided versus blind injection.  Verbal informed consent obtained.  Time-out conducted.  Noted [name] overlying erythema, induration, or other signs of local infection.  Skin  prepped in a sterile fashion.  Local anesthesia: Topical Ethyl chloride.  With sterile technique and under real time ultrasound guidance: With a 25-gauge half inch needle injected with 0.5 cc of 0.5% Marcaine and 0.5 cc of Kenalog 40 mg per mL Completed without difficulty  Pain immediately resolved suggesting accurate placement of the medication.  Advised to call if fevers/chills, erythema, induration, drainage, or persistent bleeding.  Impression: Technically successful ultrasound guided injection.  Procedure: Real-time Ultrasound Guided Injection of right acromioclavicular joint Device: GE Logiq Q7 Ultrasound guided  injection is preferred based studies that show increased duration, increased effect, greater accuracy, decreased procedural pain, increased response rate, and decreased cost with ultrasound guided versus blind injection.  Verbal informed consent obtained.  Time-out conducted.  Noted [name] overlying erythema, induration, or other signs of local infection.  Skin prepped in a sterile fashion.  Local anesthesia: Topical Ethyl chloride.  With sterile technique and under real time ultrasound guidance: With a 25-gauge half inch needle injected with 0.5 cc of 0.5% Marcaine and 0.5 cc of Kenalog 40 mg/mL. Completed without difficulty  Pain immediately resolved suggesting accurate placement of the medication.  Advised to call if fevers/chills, erythema, induration, drainage, or persistent bleeding.  Impression: Technically successful ultrasound guided injection.    Impression and Recommendations:     The above documentation has been reviewed and is accurate and complete Lyndal Pulley, DO

## 2022-11-09 ENCOUNTER — Encounter: Payer: Self-pay | Admitting: Internal Medicine

## 2022-11-09 DIAGNOSIS — M65331 Trigger finger, right middle finger: Secondary | ICD-10-CM | POA: Insufficient documentation

## 2022-11-09 NOTE — Assessment & Plan Note (Signed)
Injection given today and tolerated the procedure well, discussed icing regimen and home exercises, which activities to do and which ones to avoid.  Increase activity as tolerated.  Bracing at night encouraged.  Follow-up again in 6 to 8 weeks to further evaluate

## 2022-11-09 NOTE — Patient Instructions (Addendum)
Blood work was ordered.   The lab is on the first floor.    Medications changes include :   None     Return in about 6 months (around 05/11/2023) for follow up.    Health Maintenance, Female Adopting a healthy lifestyle and getting preventive care are important in promoting health and wellness. Ask your health care provider about: The right schedule for you to have regular tests and exams. Things you can do on your own to prevent diseases and keep yourself healthy. What should I know about diet, weight, and exercise? Eat a healthy diet  Eat a diet that includes plenty of vegetables, fruits, low-fat dairy products, and lean protein. Do not eat a lot of foods that are high in solid fats, added sugars, or sodium. Maintain a healthy weight Body mass index (BMI) is used to identify weight problems. It estimates body fat based on height and weight. Your health care provider can help determine your BMI and help you achieve or maintain a healthy weight. Get regular exercise Get regular exercise. This is one of the most important things you can do for your health. Most adults should: Exercise for at least 150 minutes each week. The exercise should increase your heart rate and make you sweat (moderate-intensity exercise). Do strengthening exercises at least twice a week. This is in addition to the moderate-intensity exercise. Spend less time sitting. Even light physical activity can be beneficial. Watch cholesterol and blood lipids Have your blood tested for lipids and cholesterol at 61 years of age, then have this test every 5 years. Have your cholesterol levels checked more often if: Your lipid or cholesterol levels are high. You are older than 61 years of age. You are at high risk for heart disease. What should I know about cancer screening? Depending on your health history and family history, you may need to have cancer screening at various ages. This may include screening  for: Breast cancer. Cervical cancer. Colorectal cancer. Skin cancer. Lung cancer. What should I know about heart disease, diabetes, and high blood pressure? Blood pressure and heart disease High blood pressure causes heart disease and increases the risk of stroke. This is more likely to develop in people who have high blood pressure readings or are overweight. Have your blood pressure checked: Every 3-5 years if you are 55-38 years of age. Every year if you are 30 years old or older. Diabetes Have regular diabetes screenings. This checks your fasting blood sugar level. Have the screening done: Once every three years after age 58 if you are at a normal weight and have a low risk for diabetes. More often and at a younger age if you are overweight or have a high risk for diabetes. What should I know about preventing infection? Hepatitis B If you have a higher risk for hepatitis B, you should be screened for this virus. Talk with your health care provider to find out if you are at risk for hepatitis B infection. Hepatitis C Testing is recommended for: Everyone born from [birth year] through 1964. Anyone with known risk factors for hepatitis C. Sexually transmitted infections (STIs) Get screened for STIs, including gonorrhea and chlamydia, if: You are sexually active and are younger than 61 years of age. You are older than 61 years of age and your health care provider tells you that you are at risk for this type of infection. Your sexual activity has changed since you were last screened, and you are  at increased risk for chlamydia or gonorrhea. Ask your health care provider if you are at risk. Ask your health care provider about whether you are at high risk for HIV. Your health care provider may recommend a prescription medicine to help prevent HIV infection. If you choose to take medicine to prevent HIV, you should first get tested for HIV. You should then be tested every 3 months for as long as you  are taking the medicine. Pregnancy If you are about to stop having your period (premenopausal) and you may become pregnant, seek counseling before you get pregnant. Take 400 to 800 micrograms (mcg) of folic acid every day if you become pregnant. Ask for birth control (contraception) if you want to prevent pregnancy. Osteoporosis and menopause Osteoporosis is a disease in which the bones lose minerals and strength with aging. This can result in bone fractures. If you are 33 years old or older, or if you are at risk for osteoporosis and fractures, ask your health care provider if you should: Be screened for bone loss. Take a calcium or vitamin D supplement to lower your risk of fractures. Be given hormone replacement therapy (HRT) to treat symptoms of menopause. Follow these instructions at home: Alcohol use Do not drink alcohol if: Your health care provider tells you not to drink. You are pregnant, may be pregnant, or are planning to become pregnant. If you drink alcohol: Limit how much you have to: 0-1 drink a day. Know how much alcohol is in your drink. In the [place] one drink equals one 12 oz bottle of beer (355 mL), one 5 oz glass of wine (148 mL), or one 1 oz glass of hard liquor (44 mL). Lifestyle Do not use any products that contain nicotine or tobacco. These products include cigarettes, chewing tobacco, and vaping devices, such as e-cigarettes. If you need help quitting, ask your health care provider. Do not use street drugs. Do not share needles. Ask your health care provider for help if you need support or information about quitting drugs. General instructions Schedule regular health, dental, and eye exams. Stay current with your vaccines. Tell your health care provider if: You often feel depressed. You have ever been abused or do not feel safe at home. Summary Adopting a healthy lifestyle and getting preventive care are important in promoting health and wellness. Follow your  health care provider's instructions about healthy diet, exercising, and getting tested or screened for diseases. Follow your health care provider's instructions on monitoring your cholesterol and blood pressure. This information is not intended to replace advice given to you by your health care provider. Make sure you discuss any questions you have with your health care provider. Document Revised: 01/24/2021 Document Reviewed: 01/24/2021 Elsevier Patient Education  [employer].

## 2022-11-09 NOTE — Assessment & Plan Note (Signed)
Chronic problem with exacerbation.  Worsening pain.  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  Discussed with patient about continuing strength and encouraged still patient avoiding certain repetitive activity.  Follow-up with me again in 6 to 8 weeks

## 2022-11-09 NOTE — Progress Notes (Signed)
Subjective:    Patient ID: [name] [name], female    DOB: Apr 27, 1961, 62 y.o.   MRN: [id number]      HPI [name] [name] is here for a Physical exam and follow-up of her chronic medical problems.    She feels wonderful.  Will retire next month  Has a little scratchy throat last night.  Feels a little phlegm in her throat.    Medications and allergies reviewed with patient and updated if appropriate.  Current Outpatient Medications on File Prior to Visit  Medication Sig Dispense Refill   albuterol (VENTOLIN HFA) 108 (90 Base) MCG/ACT inhaler Inhale 2 puffs into the lungs every 6 (six) hours as needed for wheezing or shortness of breath. 8 g 5   ALPRAZolam (XANAX) 0.5 MG tablet Take 1 tablet (0.5 mg total) by mouth 2 (two) times daily as needed for anxiety or sleep. 30 tablet 0   Azelastine HCl 137 MCG/SPRAY SOLN PLACE 2 SPRAYS INTO BOTH NOSTRILS 2 (TWO) TIMES DAILY. USE IN EACH NOSTRIL AS DIRECTED (Patient taking differently: Place 2 sprays into the nose 2 (two) times daily.) 30 mL 2   b complex vitamins tablet Take 1 tablet by mouth daily.     budesonide-formoterol (SYMBICORT) 160-4.5 MCG/ACT inhaler Inhale 2 puffs into the lungs 2 (two) times daily. (Patient taking differently: Inhale 2 puffs into the lungs 2 (two) times daily as needed.) 10.2 g 5   Cholecalciferol (VITAMIN D3) 50 MCG (2000 UT) TABS Take 1 tablet by mouth daily.     COPAXONE 40 MG/ML SOSY INJECT ONE SYRINGE SUBCUTANEOUSLY THREE TIMES PER WEEK AT LEAST 48 HOURS APART. ALLOW SYRINGE TO WARM TO ROOM TEMPERATURE FOR 20 MINUTES. REFRIGERATE. (Patient taking differently: Inject 40 mg as directed 3 (three) times a week. INJECT ONE SYRINGE SUBCUTANEOUSLY THREE TIMES PER WEEK AT LEAST 69 HOURS APART. ALLOW SYRINGE TO WARM TO ROOM TEMPERATURE FOR 20 MINUTES. REFRIGERATE.) 36 mL 2   ferrous gluconate (FERGON) 324 MG tablet Take 1 tablet (324 mg total) by mouth 2 (two) times daily with a meal. (Patient taking differently: Take  324 mg by mouth 3 (three) times a week. PER PT TAKES MON/ WED/ FRIDAY TWICE DAILY) 180 tablet 3   fluticasone (FLONASE) 50 MCG/ACT nasal spray SHAKE LIQUID AND USE 2 SPRAYS IN EACH NOSTRIL DAILY 48 mL 2   gabapentin (NEURONTIN) 100 MG capsule TAKE 3 CAPSULES BY MOUTH 3 TIMES DAILY. (Patient taking differently: Take 300 mg by mouth 3 (three) times daily.) 810 capsule 1   hydrochlorothiazide (HYDRODIURIL) 25 MG tablet TAKE 1 TABLET (25 MG TOTAL) BY MOUTH DAILY. 90 tablet 1   ipratropium (ATROVENT) 0.06 % nasal spray Place 2 sprays into both nostrils 4 (four) times daily as needed (nasal congestion/drainage). 15 mL 5   KLOR-CON M10 10 MEQ tablet TAKE 4 TABLETS BY MOUTH EVERY DAY (Patient taking differently: Take 4 mEq by mouth daily.) 360 tablet 1   LINZESS 145 MCG CAPS capsule TAKE 1 CAPSULE BY MOUTH EVERY DAY BEFORE BREAKFAST (Patient taking differently: Take 145 mcg by mouth daily before breakfast.) 90 capsule 1   loratadine (CLARITIN) 10 MG tablet Take 10 mg by mouth daily as needed for itching, rhinitis or allergies.     losartan (COZAAR) 50 MG tablet Take 1 tablet (50 mg total) by mouth daily. (Patient taking differently: Take 50 mg by mouth daily.) 90 tablet 1   metoprolol tartrate (LOPRESSOR) 50 MG tablet TAKE 1 TABLET BY MOUTH TWICE A DAY (Patient taking differently:  Take 50 mg by mouth 2 (two) times daily.) 180 tablet 2   pantoprazole (PROTONIX) 40 MG tablet TAKE 1 TABLET BY MOUTH EVERY DAY (Patient taking differently: Take 40 mg by mouth daily.) 90 tablet 1   Tart Cherry 1200 MG CAPS Take 1 capsule by mouth at bedtime.     TURMERIC PO Take 1 capsule by mouth daily.     valACYclovir (VALTREX) 500 MG tablet Take 500 mg by mouth 2 (two) times daily as needed.     XARELTO 20 MG TABS tablet TAKE 1 TABLET(20 MG) BY MOUTH DAILY WITH SUPPER 90 tablet 1   No current facility-administered medications on file prior to visit.    Review of Systems  Constitutional:  Negative for fever.  HENT:   Positive for postnasal drip. Negative for sore throat (scratchy throat).   Eyes:  Negative for visual disturbance.  Respiratory:  Negative for cough, shortness of breath and wheezing.   Cardiovascular:  Negative for chest pain, palpitations and leg swelling.  Gastrointestinal:  Positive for constipation (controlled). Negative for abdominal pain, blood in stool and diarrhea.       No gerd  Genitourinary:  Negative for dysuria.  Musculoskeletal:  Positive for arthralgias (right shoulder, hand). Negative for back pain.  Skin:  Negative for rash.  Neurological:  Negative for light-headedness and headaches.  Psychiatric/Behavioral:  Negative for dysphoric mood. The patient is not nervous/anxious.        Objective:   Vitals:   11/10/22 0831  BP: 120/78  Pulse: 70  Temp: 98.7 F (37.1 C)  SpO2: 97%   Filed Weights   11/10/22 0831  Weight: 220 lb (99.8 kg)   Body mass index is 38.97 kg/m.  BP Readings from Last 3 Encounters:  11/10/22 120/78  11/08/22 118/82  09/29/22 129/64    Wt Readings from Last 3 Encounters:  11/10/22 220 lb (99.8 kg)  11/08/22 220 lb (99.8 kg)  09/29/22 216 lb 4.8 oz (98.1 kg)       Physical Exam Constitutional: She appears well-developed and well-nourished. No distress.  HENT:  Head: Normocephalic and atraumatic.  Right Ear: External ear normal. Normal ear canal and TM Left Ear: External ear normal.  Normal ear canal and TM Mouth/Throat: Oropharynx is clear and moist.  Eyes: Conjunctivae normal.  Neck: Neck supple. No tracheal deviation present. No thyromegaly present.  No carotid bruit  Cardiovascular: Normal rate, regular rhythm and normal heart sounds.   No murmur heard.  No edema. Pulmonary/Chest: Effort normal and breath sounds normal. No respiratory distress. She has no wheezes. She has no rales.  Breast: deferred   Abdominal: Soft. She exhibits no distension. There is no tenderness.  Lymphadenopathy: She has no cervical adenopathy.   Skin: Skin is warm and dry. She is not diaphoretic.  Psychiatric: She has a normal mood and affect. Her behavior is normal.     Lab Results  Component Value Date   WBC 6.3 05/10/2022   HGB 13.6 09/29/2022   HCT 40.0 09/29/2022   PLT 257.0 05/10/2022   GLUCOSE 96 09/29/2022   CHOL 202 (H) 11/09/2021   TRIG 289.0 (H) 11/09/2021   HDL 43.70 11/09/2021   LDLDIRECT 111.0 11/09/2021   LDLCALC 133 (H) 09/20/2020   ALT 17 05/10/2022   AST 18 05/10/2022   NA 139 09/29/2022   K 3.9 09/29/2022   CL 103 09/29/2022   CREATININE 0.70 09/29/2022   BUN 12 09/29/2022   CO2 28 05/10/2022   TSH 2.14 11/09/2021  HGBA1C 5.8 05/10/2022         Assessment & Plan:   Physical exam: Screening blood work  ordered Exercise  not regular - will start after retirement Weight  encouraged weight loss Substance abuse  none   Reviewed recommended immunizations.   Health Maintenance  Topic Date Due   COVID-19 Vaccine (3 - Moderna risk series) 11/26/2022 (Originally 01/13/2020)   MAMMOGRAM  11/05/2023   PAP SMEAR-Modifier  12/04/2023   COLONOSCOPY (Pts 45-31yr Insurance coverage will need to be confirmed)  08/26/2024   DTaP/Tdap/Td (4 - Td or Tdap) 09/01/2027   INFLUENZA VACCINE  Completed   Hepatitis C Screening  Completed   HIV Screening  Completed   Zoster Vaccines- Shingrix  Completed   HPV VACCINES  Aged Out          See Problem List for Assessment and Plan of chronic medical problems.

## 2022-11-09 NOTE — Assessment & Plan Note (Signed)
With history of breast cancer on the right side would also highly recommend that if this does not seem to resolve that further imaging is necessary again.  Patient did have improvement in range of motion after the injection immediately.

## 2022-11-10 ENCOUNTER — Encounter: Payer: Self-pay | Admitting: Internal Medicine

## 2022-11-10 ENCOUNTER — Ambulatory Visit (INDEPENDENT_AMBULATORY_CARE_PROVIDER_SITE_OTHER): Payer: BC Managed Care – PPO | Admitting: Internal Medicine

## 2022-11-10 VITALS — BP 120/78 | HR 70 | Temp 98.7°F | Ht 63.0 in | Wt 220.0 lb

## 2022-11-10 DIAGNOSIS — G4733 Obstructive sleep apnea (adult) (pediatric): Secondary | ICD-10-CM

## 2022-11-10 DIAGNOSIS — G35 Multiple sclerosis: Secondary | ICD-10-CM | POA: Diagnosis not present

## 2022-11-10 DIAGNOSIS — R7303 Prediabetes: Secondary | ICD-10-CM

## 2022-11-10 DIAGNOSIS — Z0000 Encounter for general adult medical examination without abnormal findings: Secondary | ICD-10-CM

## 2022-11-10 DIAGNOSIS — K219 Gastro-esophageal reflux disease without esophagitis: Secondary | ICD-10-CM | POA: Diagnosis not present

## 2022-11-10 DIAGNOSIS — I484 Atypical atrial flutter: Secondary | ICD-10-CM

## 2022-11-10 DIAGNOSIS — E559 Vitamin D deficiency, unspecified: Secondary | ICD-10-CM | POA: Diagnosis not present

## 2022-11-10 DIAGNOSIS — J4530 Mild persistent asthma, uncomplicated: Secondary | ICD-10-CM

## 2022-11-10 DIAGNOSIS — E876 Hypokalemia: Secondary | ICD-10-CM

## 2022-11-10 DIAGNOSIS — I10 Essential (primary) hypertension: Secondary | ICD-10-CM | POA: Diagnosis not present

## 2022-11-10 DIAGNOSIS — G479 Sleep disorder, unspecified: Secondary | ICD-10-CM

## 2022-11-10 DIAGNOSIS — K5909 Other constipation: Secondary | ICD-10-CM

## 2022-11-10 LAB — CBC WITH DIFFERENTIAL/PLATELET
Basophils Absolute: 0 10*3/uL (ref 0.0–0.1)
Basophils Relative: 0.5 % (ref 0.0–3.0)
Eosinophils Absolute: 0.1 10*3/uL (ref 0.0–0.7)
Eosinophils Relative: 1.6 % (ref 0.0–5.0)
HCT: 38.2 % (ref 36.0–46.0)
Hemoglobin: 12.9 g/dL (ref 12.0–15.0)
Lymphocytes Relative: 33.2 % (ref 12.0–46.0)
Lymphs Abs: 2.4 10*3/uL (ref 0.7–4.0)
MCHC: 33.9 g/dL (ref 30.0–36.0)
MCV: 88.7 fl (ref 78.0–100.0)
Monocytes Absolute: 0.5 10*3/uL (ref 0.1–1.0)
Monocytes Relative: 6.8 % (ref 3.0–12.0)
Neutro Abs: 4.2 10*3/uL (ref 1.4–7.7)
Neutrophils Relative %: 57.9 % (ref 43.0–77.0)
Platelets: 312 10*3/uL (ref 150.0–400.0)
RBC: 4.31 Mil/uL (ref 3.87–5.11)
RDW: 15.1 % (ref 11.5–15.5)
WBC: 7.3 10*3/uL (ref 4.0–10.5)

## 2022-11-10 LAB — LIPID PANEL
Cholesterol: 220 mg/dL — ABNORMAL HIGH (ref 0–200)
HDL: 61.9 mg/dL (ref >39.00)
LDL Cholesterol: 127 mg/dL — ABNORMAL HIGH (ref 0–99)
NonHDL: 157.74
Total CHOL/HDL Ratio: 4
Triglycerides: 156 mg/dL — ABNORMAL HIGH (ref 0.0–149.0)
VLDL: 31.2 mg/dL (ref 0.0–40.0)

## 2022-11-10 LAB — COMPREHENSIVE METABOLIC PANEL
ALT: 19 U/L (ref 0–35)
AST: 18 U/L (ref 0–37)
Albumin: 4.6 g/dL (ref 3.5–5.2)
Alkaline Phosphatase: 76 U/L (ref 39–117)
BUN: 19 mg/dL (ref 6–23)
CO2: 28 mEq/L (ref 19–32)
Calcium: 10.1 mg/dL (ref 8.4–10.5)
Chloride: 102 mEq/L (ref 96–112)
Creatinine, Ser: 0.88 mg/dL (ref 0.40–1.20)
GFR: 71.4 mL/min (ref >60.00)
Glucose, Bld: 90 mg/dL (ref 70–99)
Potassium: 4.5 mEq/L (ref 3.5–5.1)
Sodium: 139 mEq/L (ref 135–145)
Total Bilirubin: 0.6 mg/dL (ref 0.2–1.2)
Total Protein: 7.9 g/dL (ref 6.0–8.3)

## 2022-11-10 LAB — VITAMIN D 25 HYDROXY (VIT D DEFICIENCY, FRACTURES): VITD: 36.43 ng/mL (ref 30.00–100.00)

## 2022-11-10 LAB — TSH: TSH: 2.76 u[IU]/mL (ref 0.35–5.50)

## 2022-11-10 LAB — HEMOGLOBIN A1C: Hgb A1c MFr Bld: 5.8 % (ref 4.6–6.5)

## 2022-11-10 NOTE — Assessment & Plan Note (Signed)
Chronic GERD controlled Continue pantoprazole 40 mg daily

## 2022-11-10 NOTE — Assessment & Plan Note (Signed)
Chronic On Copaxone Management per Dr. Brett Fairy

## 2022-11-10 NOTE — Assessment & Plan Note (Signed)
Chronic Mild, intermittent Continue Symbicort 160-4.5 mcg per ACT 2 puffs twice daily as needed Continue albuterol inhaler as needed

## 2022-11-10 NOTE — Assessment & Plan Note (Signed)
Chronic Taking vitamin D daily Check vitamin D level  

## 2022-11-10 NOTE — Assessment & Plan Note (Signed)
Chronic Check a1c Low sugar / carb diet Stressed regular exercise  

## 2022-11-10 NOTE — Assessment & Plan Note (Signed)
Chronic Continue Linzess 145 mcg daily

## 2022-11-10 NOTE — Assessment & Plan Note (Signed)
Chronic CMP Continue Klor-Con 40 mill equivalent daily

## 2022-11-10 NOTE — Assessment & Plan Note (Signed)
Chronic Blood pressure well controlled CMP Continue metoprolol 50 mg twice daily, losartan 50 mg daily, hydrochlorothiazide 25 mg daily

## 2022-11-10 NOTE — Assessment & Plan Note (Signed)
Chronic Intermittent Continue alprazolam 0.5 mg nightly as needed for anxiety or sleep-does not take often

## 2022-11-10 NOTE — Assessment & Plan Note (Signed)
Chronic Following with cardiology On Xarelto 20 mg daily, metoprolol 50 mg twice daily CBC, CMP

## 2022-11-12 ENCOUNTER — Encounter: Payer: Self-pay | Admitting: Internal Medicine

## 2022-11-13 ENCOUNTER — Telehealth: Payer: Self-pay

## 2022-11-13 NOTE — Telephone Encounter (Signed)
Received vm from patient today. Tried returning call and no answer. Unfortunately, unable to leave voicemail at this time. Will try to call patient back later today

## 2022-11-15 MED ORDER — OZEMPIC (0.25 OR 0.5 MG/DOSE) 2 MG/1.5ML ~~LOC~~ SOPN: PEN_INJECTOR | SUBCUTANEOUS | 0 refills | Status: DC

## 2022-11-16 NOTE — Therapy (Signed)
OUTPATIENT PHYSICAL THERAPY SHOULDER EVALUATION   Patient Name: [name] [name] MRN: [id number] DOB:1961/02/18, 62 y.o., female Today's Date: 11/20/2022  END OF SESSION:  PT End of Session - 11/20/22 0834     Visit Number 1    Date for PT Re-Evaluation 02/05/23    Authorization Type BC[employer]    PT Start Time 0845    PT Stop Time 0930    PT Time Calculation (min) 45 min    Activity Tolerance Patient tolerated treatment well    Behavior During Therapy [facility] for tasks assessed/performed             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 breast ([place]) 10/23/2014   s/p r mastectomy, neoadj chemo completed 04/21/15; neg genetic testing   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   Chronic constipation    Chronic fatigue 06/08/2016   Conjunctiva disorder 08/06/2018   Depression 09/03/2016   GAD (generalized anxiety disorder)    GERD    Herpes zoster without complication [id number]   History of angioedema    ACE inhibitors   Hx of adenomatous colonic polyps 05/2014   Hyperlipidemia    Hypertension    Lactose intolerance    Migraine headache    Multiple sclerosis, relapsing-remitting ([place]) 11/03/2015   neurologist--- dr Marcha Dutton   PMB (postmenopausal bleeding)    Sleep apnea    Past Surgical History:  Procedure Laterality Date   COLONOSCOPY  2018   DILATATION & CURETTAGE/HYSTEROSCOPY WITH MYOSURE N/A 09/29/2022   Procedure: DILATATION & CURETTAGE/HYSTEROSCOPY WITH MYOSURE;  Surgeon: Jerelyn Charles, MD;  Location: [place];  Service: Gynecology;  Laterality: N/A;   PORT-A-CATH REMOVAL Left 05/13/2015   Procedure: REMOVAL PORT-A-CATH;  Surgeon: [name], MD;  Location: [place];  Service: General;  Laterality: Left;   PORTACATH PLACEMENT Left  11/06/2014   Procedure: INSERTION PORT-A-CATH;  Surgeon: [name], MD;  Location: [place];  Service: General;  Laterality: Left;   SIMPLE MASTECTOMY WITH AXILLARY SENTINEL NODE BIOPSY Right 05/13/2015   Procedure: RIGHT TOTAL  MASTECTOMY WITH RIGHT  AXILLARY SENTINEL NODE BIOPSY;  Surgeon: [name], MD;  Location: [place];  Service: General;  Laterality: Right;   TUBAL LIGATION     yrs ago   Patient Active Problem List   Diagnosis Date Noted   Trigger middle finger of right hand 11/09/2022   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   Asthma 12/27/2021   Prediabetes 11/09/2021   Corn or callus 06/15/2021   Rotator cuff arthropathy of right shoulder 11/16/2020   OSA (obstructive sleep apnea) 10/08/2020   Disorder of SI (sacroiliac) joint 07/28/2020   AC (acromioclavicular) arthritis 10/09/2019   Acute pain of right shoulder 09/04/2019   Atrial flutter ([place]) 02/28/2019   RLS (restless legs syndrome) 11/21/2018   Sleep difficulties 08/06/2018   Trigger finger 01/23/2018   Meralgia paraesthetica, right 11/05/2017   Iron deficiency 10/09/2017   Vitamin D deficiency 06/15/2017   Lumbar radiculopathy 04/21/2017   Trigger index finger of left hand 04/21/2017   Morbid obesity ([place]) 04/21/2017   Left hip pain 10/31/2016   Pes planus 10/31/2016   Posterior tibialis muscle dysfunction 10/31/2016   Breast pain, right 08/24/2016   Palpitation 04/04/2016   Hand arthritis 04/04/2016   Malignant neoplasm of upper-outer quadrant of right breast  in female, 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[place]) 11/03/2015   S/P mastectomy 05/13/2015   Chemotherapy-induced neuropathy ([place]) 04/21/2015   Genetic testing 12/11/2014   Hypokalemia 12/10/2014   Breast cancer of upper-outer quadrant of right female breast ([place]) 10/23/2014   Malignant tumor of breast ([place]) 10/13/2014   Allergic rhinitis 11/29/2009   KELOID 11/05/2009   CONSTIPATION, CHRONIC [id number]   METABOLIC SYNDROME X 99991111    GERD 01/07/2008   Essential hypertension 09/20/2007   Multiple sclerosis ([place]) 09/18/2007    PCP: Billey Gosling  REFERRING PROVIDER: Hulan Saas  REFERRING DIAG:  M25.511 (ICD-10-CM) - Pain in joint of right shoulder  M54.50 (ICD-10-CM) - Lumbar spine pain    THERAPY DIAG:  Chronic right shoulder pain  Muscle weakness (generalized)  Rationale for Evaluation and Treatment: Rehabilitation  ONSET DATE: 11/08/22  SUBJECTIVE:                                                                                                                                                                                      SUBJECTIVE STATEMENT: I fell about 2 years ago and I hurt it some kind of way. I have been getting injections in it, I was told I have a lot of inflammation and arthritis in it. Last injection was last week I believe.   PERTINENT HISTORY: Multiple sclerosis, arthritis, hx of cancer  PAIN:  Are you having pain? Yes: NPRS scale: 10/10 Pain location: R shoulder Pain description: dull, constant, annoying, sharp Aggravating factors: nothing in particular Relieving factors: injections, ice sometimes and heat I alternate   PRECAUTIONS: None  WEIGHT BEARING RESTRICTIONS: No  FALLS:  Has patient fallen in last 6 months? 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   PLOF: Independent  PATIENT GOALS:to get better movement in my back and shoulder    OBJECTIVE:   PATIENT SURVEYS:  FOTO 63  COGNITION: Overall cognitive status: Within functional limits for tasks assessed     SENSATION: WFL  POSTURE: Rounded shoulders  UPPER EXTREMITY ROM:   Active ROM Right eval Left eval  Shoulder flexion WNL WNL  Shoulder extension    Shoulder abduction WNL WNL  Shoulder adduction    Shoulder internal rotation WNL   Shoulder external rotation WFL  pain at end range   Elbow flexion    Elbow extension    Wrist flexion    Wrist extension    Wrist ulnar deviation    Wrist radial deviation    Wrist pronation    Wrist supination    (Blank rows =  not tested)  UPPER EXTREMITY MMT:  MMT Right eval Left eval  Shoulder flexion 2+ with pain   Shoulder extension    Shoulder abduction 3+ with pain   Shoulder adduction    Shoulder internal rotation 5   Shoulder external rotation 3+ with pain   Middle trapezius    Lower trapezius    Elbow flexion 5 5  Elbow extension 5 5  Wrist flexion    Wrist extension    Wrist ulnar deviation    Wrist radial deviation    Wrist pronation    Wrist supination    Grip strength (lbs)    (Blank rows = not tested)  SHOULDER SPECIAL TESTS: Impingement tests: Neer impingement test: positive , Hawkins/Kennedy impingement test: positive , and Painful arc test: positive   JOINT MOBILITY TESTING:  Full PROM   PALPATION:  No TTP    TODAY'S TREATMENT:                                                                                                                                         DATE: 11/20/22- EVAL    PATIENT EDUCATION: Education details: HEP and POC Person educated: Patient Education method: Explanation Education comprehension: verbalized understanding  HOME EXERCISE PROGRAM: Access Code: [email or web address] URL: [email or web address] Date: 11/20/2022 Prepared by: Andris Baumann  Exercises - Doorway Pec Stretch at 90 Degrees Abduction  - 2 x daily - 7 x weekly - 2 reps - 30 hold - Standing Shoulder Horizontal Abduction with Resistance  - 1 x daily - 7 x weekly - 2 sets - 10 reps - Seated Thoracic Lumbar Extension with Pectoralis Stretch  - 1 x daily - 7 x weekly - 2 sets - 10 reps - Standing Shoulder Flexion with Resistance  - 1 x daily - 7 x weekly - 2 sets - 10 reps - Standing Single Arm Shoulder Abduction with Resistance  - 1 x daily - 7 x weekly - 2 sets - 10  reps  ASSESSMENT:  CLINICAL IMPRESSION: Patient is a 62 y.o. female who was seen today for physical therapy evaluation and treatment for R shoulder pain. She presents with increased weakness in her R shoulder especially into flexion and abduction. She rates her pain a 10/10 when it hurts. Patient has full range of motion. She may have some bursitis or impingement in the R shoulder. She will benefit from skilled PT to address her R shoulder pain and weakness to be able to complete household chores and ADLs without an increase in pain.   OBJECTIVE IMPAIRMENTS: decreased strength and pain.   REHAB POTENTIAL: Good  CLINICAL DECISION MAKING: Stable/uncomplicated  EVALUATION COMPLEXITY: Low   GOALS: Goals reviewed with patient? Yes  SHORT TERM GOALS: Target date: 12/25/22  Patient will be independent with initial HEP.  Goal status: INITIAL   LONG TERM GOALS: Target date: 02/05/23  Patient will be independent with advanced/ongoing  HEP to improve outcomes and carryover.  Goal status: INITIAL  2.  Patient will report 75% improvement in R shoulder pain to improve QOL.  Baseline: 10/10 when it hurts Goal status: INITIAL  3.  Patient will demonstrate improved functional RUE strength as demonstrated by 5/5. Goal status: INITIAL  4.  Patient will report 26 on FOTO (patient outcome measure) to demonstrate improved functional ability.  Baseline: 63 Goal status: INITIAL  PLAN:  PT FREQUENCY: 1-2x/week  PT DURATION: 10 weeks  PLANNED INTERVENTIONS: Therapeutic exercises, Therapeutic activity, Neuromuscular re-education, Balance training, Gait training, Patient/Family education, Self Care, Joint mobilization, Dry Needling, Electrical stimulation, Cryotherapy, Moist heat, Ionotophoresis '4mg'$ /ml Dexamethasone, and Manual therapy  PLAN FOR NEXT SESSION: R shoulder strengthening   Andris Baumann, PT 11/20/2022, 9:43 AM

## 2022-11-17 ENCOUNTER — Telehealth: Payer: Self-pay | Admitting: Family Medicine

## 2022-11-17 NOTE — Telephone Encounter (Signed)
Patient called stating that she has started having some right knee pain and asked what she could use over the counter that might help?  Please advise.

## 2022-11-20 ENCOUNTER — Ambulatory Visit (INDEPENDENT_AMBULATORY_CARE_PROVIDER_SITE_OTHER): Payer: BC Managed Care – PPO

## 2022-11-20 ENCOUNTER — Ambulatory Visit: Payer: BC Managed Care – PPO | Admitting: Internal Medicine

## 2022-11-20 ENCOUNTER — Ambulatory Visit: Payer: BC Managed Care – PPO | Attending: Family Medicine

## 2022-11-20 ENCOUNTER — Telehealth: Payer: Self-pay | Admitting: Internal Medicine

## 2022-11-20 DIAGNOSIS — M25511 Pain in right shoulder: Secondary | ICD-10-CM | POA: Diagnosis not present

## 2022-11-20 DIAGNOSIS — M6281 Muscle weakness (generalized): Secondary | ICD-10-CM | POA: Diagnosis present

## 2022-11-20 DIAGNOSIS — Z111 Encounter for screening for respiratory tuberculosis: Secondary | ICD-10-CM | POA: Diagnosis not present

## 2022-11-20 DIAGNOSIS — G8929 Other chronic pain: Secondary | ICD-10-CM | POA: Diagnosis present

## 2022-11-20 DIAGNOSIS — M545 Low back pain, unspecified: Secondary | ICD-10-CM | POA: Diagnosis not present

## 2022-11-20 NOTE — Telephone Encounter (Signed)
Tried to call patient. No answer and VM was full.

## 2022-11-20 NOTE — Progress Notes (Signed)
Tuberculin skin test applied to left  ventral forearm. Explained to pt that they will come into office on Wednesday to get it read

## 2022-11-20 NOTE — Telephone Encounter (Signed)
Pt called stating that the pharmacy need Prior Authorization for ozempic.

## 2022-11-21 ENCOUNTER — Telehealth: Payer: Self-pay

## 2022-11-21 NOTE — Telephone Encounter (Signed)
Spoke to patient and informed. She said that she found some Pensaid at home and has been using that which has helped the pain.

## 2022-11-21 NOTE — Telephone Encounter (Signed)
[name] [name] (Key: [id number])  PA submitted on yesterday with clinicals.  Patient informed.   [name] [name] (Key: [id number])  form thumbnail Your information has been submitted to [facility]. To check for an updated outcome later, reopen this PA request from your dashboard.  If [employer] has not responded to your request within 24 hours, contact [name] at [phone]. If you think there may be a problem with your PA request, use our live chat feature at the bottom right.

## 2022-11-22 ENCOUNTER — Ambulatory Visit: Payer: BC Managed Care – PPO

## 2022-11-22 LAB — TB SKIN TEST
Induration: 0 mm
TB Skin Test: NEGATIVE

## 2022-11-22 NOTE — Progress Notes (Signed)
I was able to read pts TB skin test on forearm and it was negative.

## 2022-11-23 ENCOUNTER — Other Ambulatory Visit (HOSPITAL_COMMUNITY): Payer: Self-pay

## 2022-11-23 ENCOUNTER — Encounter: Payer: Self-pay | Admitting: Nurse Practitioner

## 2022-11-23 NOTE — Telephone Encounter (Signed)
Ozempic is now only approved for Type 2 diabetes. Please change to [facility] if appropriate or advise.

## 2022-11-25 MED ORDER — WEGOVY 0.25 MG/0.5ML ~~LOC~~ SOAJ: 0.2500 mg | SUBCUTANEOUS | 0 refills | Status: DC

## 2022-11-25 NOTE — Telephone Encounter (Signed)
Wegovy sent to pharmacy

## 2022-11-27 ENCOUNTER — Ambulatory Visit: Payer: BC Managed Care – PPO

## 2022-11-27 ENCOUNTER — Ambulatory Visit
Admission: RE | Admit: 2022-11-27 | Discharge: 2022-11-27 | Disposition: A | Discharge status: Home / Self Care | Payer: BC Managed Care – PPO | Source: Ambulatory Visit | Attending: Internal Medicine | Admitting: Internal Medicine

## 2022-11-27 ENCOUNTER — Other Ambulatory Visit: Payer: Self-pay | Admitting: Internal Medicine

## 2022-11-27 DIAGNOSIS — Z1231 Encounter for screening mammogram for malignant neoplasm of breast: Secondary | ICD-10-CM

## 2022-11-27 HISTORY — DX: Personal history of antineoplastic chemotherapy: Z92.21

## 2022-11-28 ENCOUNTER — Telehealth: Payer: Self-pay | Admitting: Internal Medicine

## 2022-11-28 MED ORDER — LOSARTAN POTASSIUM 50 MG PO TABS: 50.0000 mg | ORAL_TABLET | Freq: Every day | ORAL | 3 refills | Status: DC

## 2022-11-28 NOTE — Telephone Encounter (Signed)
Patient called to see why her losartan (COZAAR) 50 MG tablet  has not been approved. She would like a call back letting her know why at [phone]. If it can be approved, she would like it sent to [id number] [place] [place]

## 2022-11-28 NOTE — Telephone Encounter (Signed)
Notified pt not sure why was denied. Was given back in 07/2022. Pt had cpx 10/2022 [facility]

## 2022-11-28 NOTE — Telephone Encounter (Signed)
Called Pt and was unable to left voicemail , if she calls back let her know she was sent in a 90 day supply with one refill back in November. So she should have a refill available at her pharmacy she would need to reach out to them to fill the prescription.

## 2022-12-04 ENCOUNTER — Other Ambulatory Visit: Payer: Self-pay | Admitting: Family Medicine

## 2022-12-04 ENCOUNTER — Ambulatory Visit: Payer: BC Managed Care – PPO

## 2022-12-08 ENCOUNTER — Telehealth: Payer: Self-pay | Admitting: Internal Medicine

## 2022-12-08 NOTE — Telephone Encounter (Signed)
Pt call sated pharmacy been waiting on Prior authorization 2 weeks.  Semaglutide-Weight Management (WEGOVY) 0.25 MG/0.5ML

## 2022-12-11 ENCOUNTER — Ambulatory Visit: Payer: BC Managed Care – PPO

## 2022-12-11 ENCOUNTER — Encounter: Payer: Self-pay | Admitting: Neurology

## 2022-12-11 ENCOUNTER — Ambulatory Visit (INDEPENDENT_AMBULATORY_CARE_PROVIDER_SITE_OTHER): Payer: BC Managed Care – PPO | Admitting: Neurology

## 2022-12-11 VITALS — BP 127/82 | HR 84 | Ht 65.0 in | Wt 218.0 lb

## 2022-12-11 DIAGNOSIS — T451X5A Adverse effect of antineoplastic and immunosuppressive drugs, initial encounter: Secondary | ICD-10-CM

## 2022-12-11 DIAGNOSIS — G620 Drug-induced polyneuropathy: Secondary | ICD-10-CM | POA: Diagnosis not present

## 2022-12-11 DIAGNOSIS — I484 Atypical atrial flutter: Secondary | ICD-10-CM | POA: Diagnosis not present

## 2022-12-11 DIAGNOSIS — G4733 Obstructive sleep apnea (adult) (pediatric): Secondary | ICD-10-CM | POA: Diagnosis not present

## 2022-12-11 DIAGNOSIS — E66812 Obesity, class 2: Secondary | ICD-10-CM

## 2022-12-11 NOTE — Patient Instructions (Signed)

## 2022-12-11 NOTE — Progress Notes (Signed)
SLEEP MEDICINE CLINIC   Provider:  Larey Seat, [place] D  Primary Care Physician:  Binnie Rail, MD   Referring Provider: Binnie Rail, MD   Chief Complaint  Patient presents with   Insomnia    Rm 1 alone Pt is well, states she has been having trouble falling asleep and staying asleep for about 10-15 years. Had been a patient until 3.5 years ago here, then followed Dr Fransico Him, who did a HST and order CPAP. She was unable to tolerate CPAP     HPI:  [name] [name] is a 62 y.o. female , seen here as in a referral  from Dr. Quay Burow for insomnia;  Provider:  Larey Seat, MD   Referring Provider: Binnie Rail, MD Primary Care Physician:  Binnie Rail, MD  Chief Complaint  Patient presents with   New Patient (Initial Visit)    pt alone,  can fall asleep but not stay asleep, chronic insomnia for decades.  Onset of insomnia in 2010 , while care taking for her mother, day and night.  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      12-11-2022; This  62 year -old AA female patient is meanwhile retired, and seen here after a 3 year hiatus, now again treated as a new patient for Sleep Clinic. Chief compliant is again chronic insomnia.  She has undergone a HST with dr Radford Pax, Cardiology- after she had tachycardia , atrial fib, now on Xeralto.   HST in 2020- that did test positive using the WatchPAT device and showed mild obstructive sleep apnea with an AHI of 8/h.  Oxygen nadir was 84% but no significant time was spent in hypoxia.  Total sleep time was 6 hours 40 minutes.  This sleep study was not done with [place] but by her cardiologist Golden Hurter on April 22, 2019.  Golden Hurter initiated the CPAP treatment but the patient states she could not tolerate it and therefore discontinued it.  No data from CPAP use were available.   Looking at the sleep study report Dr. Theodosia Blender interpretation did not put  much attention to the REM sleep AHI.  This patient has 25 apneas and hypopneas in rem sleep and only 3.5 in non-REM sleep.  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  She had mild to moderate snoring present.  Her AHI was interestingly higher in prone sleep position than in supine she should however sleep on the left or right side in order to treat her apnea without CPAP.  Weight loss would help to reduce some of the REM sleep dependency.  Medical history : Obesity, awaiting Wegovy through PCP. Pre-diabetes.  Mastectomy after breast cancer on the right breast, 2016. Chemotherapy pre surgery. No radiation.    Social history and sleep habits: bedroom is cool and quiet dark. She sleeps alone.  She takes a hot shower or bath before bedtime. Switches TV off after 9 PM and until then watches TV in bed-  she is usually asleep by 10 PM and she sleeps in 2-3 hour intervals, and will restart the Tv (!!!). Why?  There should be no screen in the bedroom, the alarm-clock should be turned away from view or covered by a cloth.  Total hours of sleep averages 4 hours (!).  Doesn't nap in daytime. Hasn't napped  since her thirties.   Family history : daughter sleeps well, mother was a good sleeper, father died young.    12-01-18  [name] [name] is a 62 y.o. female is seen after one year- last seen by Lowella Dandy in  March 2019, and was originally here as a referral from Dr. Quay Burow for sleep care,  headaches and re-establish care at [facility]. I have the pleasure of meeting Ms. Zriyah Penley today whom I personally last saw in 2017 who has seen the last 2 visits in our office Cecille Rubin, NP.  The original referral or re-referral was for neuropathy but today we are meeting to discuss her sleeping difficulties.  Over the last 5 years ago the patient had undergone a sleep study at the [facility] long sleep lab.  She was never treated for  anything based off that study and assume that there was nothing found.  There have been a lot of medical changes however, she is a cancer survivor and has been declared breast cancer free, 5 years status post chemotherapy ( no radiation but surgery, a mastectomy of the right breast). She feels that her cancer diagnosis and treatment coincide with her insomnia problems.    Sleep habits: The patient lives alone and has an early dinner usually around 5 PM, she may spend the evening watching TV or reading and she goes to bed by 8 or 9 PM, but she also watches TV in bed.  She waits until she feels sleepy enough, switches the TV off and then still struggles often to fall asleep. Right side sleeper, using one pillow. The bedroom is cool, quiet and dark after the TV is off.   Once asleep, she may stay asleep for 2 to 3 hours, sometimes woken by hot flashes, other times by hand and arm pain ( likely related to her neuropathy), many times she is not sure what wakes her. Sometimes she will go to the bathroom but it was not the urge to urinate that woke her up. If she wakes around midnight it will take her an hour before she can go back to sleep.  Once asleep she may sleep again for 2 to 3 hours. After she wakes up again, she can't sleep any longer. She rises at 4.30AM for work. She feels fatigues, sleepiness and lethargy.     "HX 2017- new visit . Over the last 3 years , the patient had been followed by Dr. Doyle Askew, neurologist at [facility].  She has MRIs at [facility], and was in the meantime diagnosed with breast cancer.  She received the diagnosed on February 9th 2016 .The patient had not been seen in our practice since Epic has been implemented. Her last visit was with Cecille Rubin on 10/30/2012, the last visit prior to that January  24th 2011. She was on Copaxone and use it without injection site problems at the time she gets patient assistance through shared solutions prior to Copaxone she was on very brisk  but could not tolerate the medication she had also been on Avonex and developed side effects she remains on Copaxone now. She has switched to Copaxone 3 times weekly injection over 3 years ago and she had no exacerbation or relapse in several years. Since she was diagnosed with breast cancer a year ago she has undergone chemotherapy and a right breast mastectomy. She has no acute problems and wants to be followed again at [facility] for MS.".    Sleep medical history: I slept good until 5 years  ago. Even in my pregnancy. 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 Mrs. Moorehead was diagnosed with breast cancer in 2016, underwent mastectomy of the right breast and chemotherapy.  She also carries a diagnosis of  MS, of meralgia paresthetica, vitamin D deficiency, CAD, transient iron deficiency, obesity, depression-anxiety.  I also reviewed the patient's medication. She continues on Copaxone for MS, ferrous gluconate for iron deficiency, Flonase as needed for rhinitis, gabapentin 3 capsules by mouth 3 times a day for neuropathy, hydrochlorothiazide for blood pressure control Linzess for irritable bowel control, Cozaar for blood pressure, metoprolol for blood pressure, Protonix for GERD, Center mine was originally prescribed for weight loss 8 she is not taking it currently.  Potassium supplement Symbicort, vitamin D supplement, trazodone 50 mg at night as needed for sleep. She takes goodie powder with caffeine for headaches.       Family sleep history:  No apnea history    Social history: single, adult children ( daughter 93, son is 28), was never married.  Children are perceived as not supportive ( I am not allowed to mention my cancer, fears and worries )  Full time gainfully employed. Non smoker, non drinker, caffeine rarely.  No regular exercises.   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      Review of Systems: Out of a complete 14 system review, the patient complains of only the following symptoms, and all other reviewed systems are negative.  Insomnia. Chronic and associated with poor sleep hygiene.    Neuropathy, on Gabapentin by Oncologist   MS by history - Dr Olen Pel d/c treatment after breast cancer .  Can't sleep on trazodone.     Epworth Sleepiness score: 3/ 24  Fatigue severity score:29/ 63  Depression score: N/A but tearful.    Social History   Socioeconomic History   Marital status: Single    Spouse name: Not on file   Number of children: 2   Years of education: 12   Highest education level: Not on file  Occupational History   Occupation: Solicitor: [employer]  Tobacco Use   Smoking status: Never   Smokeless tobacco: Never  Vaping Use   Vaping Use: Never used  Substance and Sexual Activity   Alcohol use: No    Alcohol/week: 0.0 standard drinks of alcohol   Drug use: Never   Sexual activity: Not on file  Other Topics Concern   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    Patient is single and lives alone.    Patient is right handed.    Social Determinants of Health   Financial Resource Strain: Not on file  Food Insecurity: Not on file  Transportation Needs: Not on file  Physical Activity: Not on file  Stress: Not on file  Social Connections: Not on file  Intimate Partner Violence: Not on file    Family History  Problem Relation Age of Onset   Coronary artery disease Mother    Heart disease Mother    Diabetes Mother    Hypertension Mother    Stroke Mother    Alcohol abuse Father    COPD Maternal Grandmother    Hypertension Other    Hyperlipidemia Other    Colon cancer Neg Hx  Rectal cancer Neg Hx    Stomach cancer Neg Hx    Esophageal cancer Neg Hx    Breast cancer Neg Hx     Past Medical  History:  Diagnosis Date   Allergic rhinitis due to other allergen    Anticoagulated    xarelto--- managed by cardiology   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   Breast cancer of upper-outer quadrant of right female breast ([place]) 10/23/2014   s/p r mastectomy, neoadj chemo completed 04/21/15; neg genetic testing   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   Chronic constipation    Chronic fatigue 06/08/2016   Conjunctiva disorder 08/06/2018   Depression 09/03/2016   GAD (generalized anxiety disorder)    GERD    Herpes zoster without complication [id number]   History of angioedema    ACE inhibitors   Hx of adenomatous colonic polyps 05/2014   Hyperlipidemia    Hypertension    Lactose intolerance    Migraine headache    Multiple sclerosis, relapsing-remitting ([place]) 11/03/2015   neurologist--- dr Marcha Dutton   Personal history of chemotherapy    PMB (postmenopausal bleeding)    Sleep apnea     Past Surgical History:  Procedure Laterality Date   BREAST BIOPSY Right 2016   x4   COLONOSCOPY  2018   DILATATION & CURETTAGE/HYSTEROSCOPY WITH MYOSURE N/A 09/29/2022   Procedure: DILATATION & CURETTAGE/HYSTEROSCOPY WITH MYOSURE;  Surgeon: Jerelyn Charles, MD;  Location: [place];  Service: Gynecology;  Laterality: N/A;   MASTECTOMY Right 2016   PORT-A-CATH REMOVAL Left 05/13/2015   Procedure: REMOVAL PORT-A-CATH;  Surgeon: [name], MD;  Location: [place];  Service: General;  Laterality: Left;   PORTACATH PLACEMENT Left 11/06/2014   Procedure: INSERTION PORT-A-CATH;  Surgeon: [name], MD;  Location: [place];  Service: General;  Laterality: Left;   SIMPLE MASTECTOMY WITH AXILLARY SENTINEL NODE BIOPSY Right 05/13/2015   Procedure: RIGHT TOTAL  MASTECTOMY WITH RIGHT  AXILLARY SENTINEL NODE BIOPSY;  Surgeon: [name], MD;  Location: [place];  Service: General;  Laterality: Right;   TUBAL LIGATION     yrs ago     Current Outpatient Medications  Medication Sig Dispense Refill   albuterol (VENTOLIN HFA) 108 (90 Base) MCG/ACT inhaler Inhale 2 puffs into the lungs every 6 (six) hours as needed for wheezing or shortness of breath. 8 g 5   ALPRAZolam (XANAX) 0.5 MG tablet Take 1 tablet (0.5 mg total) by mouth 2 (two) times daily as needed for anxiety or sleep. 30 tablet 0   Azelastine HCl 137 MCG/SPRAY SOLN PLACE 2 SPRAYS INTO BOTH NOSTRILS 2 (TWO) TIMES DAILY. USE IN EACH NOSTRIL AS DIRECTED (Patient taking differently: Place 2 sprays into the nose 2 (two) times daily.) 30 mL 2   b complex vitamins tablet Take 1 tablet by mouth daily.     budesonide-formoterol (SYMBICORT) 160-4.5 MCG/ACT inhaler Inhale 2 puffs into the lungs 2 (two) times daily. (Patient taking differently: Inhale 2 puffs into the lungs 2 (two) times daily as needed.) 10.2 g 5   Cholecalciferol (VITAMIN D3) 50 MCG (2000 UT) TABS Take 1 tablet by mouth daily.     COPAXONE 40 MG/ML SOSY INJECT ONE SYRINGE SUBCUTANEOUSLY THREE TIMES PER WEEK AT LEAST 48 HOURS APART. ALLOW SYRINGE TO WARM TO ROOM TEMPERATURE FOR 20 MINUTES. REFRIGERATE. (Patient taking differently: Inject 40 mg as directed 3 (three) times a week. INJECT ONE SYRINGE SUBCUTANEOUSLY THREE TIMES PER WEEK AT LEAST 76 HOURS APART. ALLOW SYRINGE TO WARM  TO ROOM TEMPERATURE FOR 20 MINUTES. REFRIGERATE.) 36 mL 2   ferrous gluconate (FERGON) 324 MG tablet Take 1 tablet (324 mg total) by mouth 2 (two) times daily with a meal. (Patient taking differently: Take 324 mg by mouth 3 (three) times a week. PER PT TAKES MON/ WED/ FRIDAY TWICE DAILY) 180 tablet 3   fluticasone (FLONASE) 50 MCG/ACT nasal spray SHAKE LIQUID AND USE 2 SPRAYS IN EACH NOSTRIL DAILY 48 mL 2   gabapentin (NEURONTIN) 100 MG capsule TAKE 3 CAPSULES BY MOUTH 3 TIMES DAILY. (Patient taking differently: Take 300 mg by mouth 3 (three) times daily.) 810 capsule 1   hydrochlorothiazide (HYDRODIURIL) 25 MG tablet TAKE 1 TABLET (25 MG  TOTAL) BY MOUTH DAILY. 90 tablet 1   ipratropium (ATROVENT) 0.06 % nasal spray Place 2 sprays into both nostrils 4 (four) times daily as needed (nasal congestion/drainage). 15 mL 5   KLOR-CON M10 10 MEQ tablet TAKE 4 TABLETS BY MOUTH EVERY DAY (Patient taking differently: Take 4 mEq by mouth daily.) 360 tablet 1   LINZESS 145 MCG CAPS capsule TAKE 1 CAPSULE BY MOUTH EVERY DAY BEFORE BREAKFAST (Patient taking differently: Take 145 mcg by mouth daily before breakfast.) 90 capsule 1   loratadine (CLARITIN) 10 MG tablet Take 10 mg by mouth daily as needed for itching, rhinitis or allergies.     losartan (COZAAR) 50 MG tablet Take 1 tablet (50 mg total) by mouth daily. 90 tablet 3   metoprolol tartrate (LOPRESSOR) 50 MG tablet TAKE 1 TABLET BY MOUTH TWICE A DAY (Patient taking differently: Take 50 mg by mouth 2 (two) times daily.) 180 tablet 2   pantoprazole (PROTONIX) 40 MG tablet TAKE 1 TABLET BY MOUTH EVERY DAY (Patient taking differently: Take 40 mg by mouth daily.) 90 tablet 1   Semaglutide-Weight Management (WEGOVY) 0.25 MG/0.5ML SOAJ Inject 0.25 mg into the skin once a week. 2 mL 0   Tart Cherry 1200 MG CAPS Take 1 capsule by mouth at bedtime.     TURMERIC PO Take 1 capsule by mouth daily.     valACYclovir (VALTREX) 500 MG tablet Take 500 mg by mouth 2 (two) times daily as needed.     XARELTO 20 MG TABS tablet TAKE 1 TABLET(20 MG) BY MOUTH DAILY WITH SUPPER 90 tablet 1   No current facility-administered medications for this visit.    Allergies as of 12/11/2022 - Review Complete 12/11/2022  Allergen Reaction Noted   Ace inhibitors Anaphylaxis 10/19/2008   Clarithromycin Anaphylaxis    Contrave [naltrexone-bupropion hcl er] Shortness Of Breath 06/15/2017   Levaquin [levofloxacin] Other (See Comments) 08/27/2013    Vitals: BP 127/82   Pulse 84   Ht 5\' 5"  (1.651 m)   Wt 218 lb (98.9 kg)   LMP 11/01/2012   BMI 36.28 kg/m  Last Weight:  Wt Readings from Last 1 Encounters:  12/11/22 218  lb (98.9 kg)   [id number] mass index is 36.28 kg/m.     Last Height:   Ht Readings from Last 1 Encounters:  12/11/22 5\' 5"  (1.651 m)    Physical exam:  General: The patient is awake, alert and appears not in acute distress. The patient is well groomed. Head: Normocephalic, atraumatic. Neck is supple. Mallampati: 3,  neck circumference:16 ". Nasal airflow patent -TMJ click in longer evident.  Retrognathia is mildy seen.  Cardiovascular:  Regular rate and rhythm, without  murmurs or carotid bruit, and without distended neck veins. Respiratory:  Skin:  Without evidence of edema, or  rash Trunk: BMI is 36.21kg/m2 . The patient's posture is erect.   Neurologic exam : The patient is awake and alert, oriented to place and time.   Memory subjective described as intact.   Attention span & concentration ability appears normal.  Speech is fluent,  without dysarthria, dysphonia or aphasia.  Mood and affect are appropriate.  Cranial nerves: Pupils are equal and briskly reactive to light. Extraocular movements  in vertical and horizontal planes intact and without nystagmus. Visual fields by finger perimetry are intact. Hearing to finger rub intact.   Facial sensation intact to fine touch.  Facial motor strength is symmetric and tongue and uvula move midline. Shoulder shrug was symmetrical.   Motor exam: Normal tone, muscle bulk and symmetric strength in all extremities. Strong grip strength bilaterally.   Sensory:  Fine touch, pinprick and vibration were tested in all extremities. Proprioception tested in the upper extremities was normal.  Coordination: Rapid alternating movements in the fingers/hands was normal.  Finger-to-nose maneuver  normal without evidence of ataxia, dysmetria or tremor.  Gait and station: Patient walks without assistive device and is able unassisted to climb up to the exam table. Strength within normal limits.  Stance is stable and normal. Toe stand was normal. Walking  on tip toes and on heels , steadily. Tandem gait is unfragmented. Turns with 3.5  Steps.  Deep tendon reflexes: in the upper and lower extremities are symmetric and intact.    Assessment/ Plan  After physical and neurologic examination, review of laboratory studies,  Personal review of imaging studies, reports of other /same  Imaging studies, results of polysomnography and / or neurophysiology testing and pre-existing records as far as provided in visit., my assessment is:   1)  Insomnia. CHRONIC and maintained  by poor habits, TV in Bed, TV on when waking up from sleep - hindering to resume sleep.  She goes to bed not to sleep but to watch TV, there is nothing else to do.  Retirement has been not so good for her, she likes interaction and the being on her feet . She has explored returning to work part time.   Insomnia was beginning with caretaker duties in 2010 , forcing her to be vigilant about her mother's needs. It became somewhat more pronounced after her mother's death and her cancer diagnosis. She was lonely and alone in fighting the disease, he children reportedly don't support her very much.    There is no neurological treatment for this condition. This I would be best treated  cognitive behavior therapy, but she is not interested. .  Concentrate on sleep hygiene to improve sleep quality and duration.  Eliminate electronics form the bedroom, no screen light, use audio books or calming background sounds to support sleep. Read in a book with pages!   2) neuropathy is controlled currently treated by oncology. Neuropathy from cancer treatments has been controlled under gabapentin . No more RLS .   3)  SVT- chronically anticoagulated - cardiologist is dr Radford Pax who has also found mild REM sleep dependent apnea . The patient has not used CPAP. She is likely snoring, as she wakes up with a dry mouth.  Best treatment is weight loss for her, this also compensates for REM dependent sleep apnea.  There is no dental device or inspire device that would be used with this BMI and for this type of apnea.  I hope she will get to be on [facility] as she told me, that associated weight loss  could render any apnea treatment unnecessary. Avoid supine sleep. !!!   I spent more than 35 minutes of face to face time with the patient. Greater than 50% of time was spent in counseling and coordination of care. We have discussed the diagnosis and differential and I answered the patient's questions.     No follow up needed but the patient wants to follow yearly- I suggested a HST to be repeated when she lost at least 25 pounds- that may be the point when apnea is not longer present.    Larey Seat, MD [id number], [id number] PM  Certified in Neurology by ABPN Certified in [place] by [facility] [address], [place], [place] [address]

## 2022-12-13 ENCOUNTER — Encounter: Payer: Self-pay | Admitting: Internal Medicine

## 2022-12-13 ENCOUNTER — Telehealth: Payer: Self-pay

## 2022-12-13 NOTE — Telephone Encounter (Signed)
[name] [name] (Key: [id number])

## 2022-12-14 ENCOUNTER — Other Ambulatory Visit (HOSPITAL_COMMUNITY): Payer: Self-pay

## 2022-12-14 NOTE — Telephone Encounter (Signed)
Patient Advocate Encounter  Prior Authorization for [employer] 0.25mg /0.54ml has been approved.    Effective dates: 12/13/22 through 07/15/23

## 2022-12-14 NOTE — Telephone Encounter (Signed)
Patient Advocate Encounter  Prior Authorization for Mancel Parsons has been approved.     Effective dates: 12/13/22 through 07/15/23  Approval letter attached to chart

## 2022-12-18 ENCOUNTER — Ambulatory Visit: Payer: BC Managed Care – PPO | Admitting: Physical Therapy

## 2022-12-18 NOTE — Telephone Encounter (Signed)
My-chart sent to patient and patient notified.

## 2022-12-19 ENCOUNTER — Other Ambulatory Visit: Payer: Self-pay | Admitting: Internal Medicine

## 2022-12-20 ENCOUNTER — Other Ambulatory Visit: Payer: Self-pay

## 2022-12-20 ENCOUNTER — Ambulatory Visit: Payer: BC Managed Care – PPO | Admitting: Family Medicine

## 2022-12-20 MED ORDER — DICLOFENAC SODIUM 2 % EX SOLN: 2.0000 | Freq: Two times a day (BID) | CUTANEOUS | 0 refills | Status: DC | PRN

## 2022-12-26 ENCOUNTER — Ambulatory Visit: Payer: BC Managed Care – PPO | Admitting: Internal Medicine

## 2022-12-26 ENCOUNTER — Ambulatory Visit (INDEPENDENT_AMBULATORY_CARE_PROVIDER_SITE_OTHER): Payer: BC Managed Care – PPO | Admitting: Family Medicine

## 2022-12-26 VITALS — Ht 65.0 in

## 2022-12-26 DIAGNOSIS — R200 Anesthesia of skin: Secondary | ICD-10-CM

## 2022-12-26 DIAGNOSIS — M5416 Radiculopathy, lumbar region: Secondary | ICD-10-CM

## 2022-12-26 DIAGNOSIS — M545 Low back pain, unspecified: Secondary | ICD-10-CM

## 2022-12-26 MED ORDER — METHYLPREDNISOLONE ACETATE 80 MG/ML IJ SUSP
80.0000 mg | Freq: Once | INTRAMUSCULAR | Status: AC
Start: 2022-12-26 — End: 2022-12-26
  Administered 2022-12-26: 80 mg via INTRAMUSCULAR

## 2022-12-26 MED ORDER — KETOROLAC TROMETHAMINE 60 MG/2ML IM SOLN
60.0000 mg | Freq: Once | INTRAMUSCULAR | Status: AC
  Administered 2022-12-26: 60 mg via INTRAMUSCULAR

## 2022-12-26 MED ORDER — GABAPENTIN 300 MG PO CAPS: 300.0000 mg | ORAL_CAPSULE | Freq: Every day | ORAL | 0 refills | Status: DC

## 2022-12-26 NOTE — Patient Instructions (Signed)
Gabapentin 300mg  2x a day prescribed Send [place] a message in 2 weeks to let us know how you're doing See you again in 6 weeks

## 2022-12-26 NOTE — Progress Notes (Signed)
[name] [address] [place] [address] Phone: [phone] Subjective:    I'm seeing this patient by the request  of:  Pincus Sanes, MD  CC: Right leg pain, back pain  [id number]  11/08/2022 With history of breast cancer on the right side would also highly recommend that if this does not seem to resolve that further imaging is necessary again. Patient did have improvement in range of motion after the injection immediately.   Chronic problem with exacerbation. Worsening pain. 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 Discussed with patient about continuing strength and encouraged still patient avoiding certain repetitive activity. Follow-up with me again in 6 to 8 weeks   Injection given today and tolerated the procedure well, discussed icing regimen and home exercises, which activities to do and which ones to avoid. Increase activity as tolerated. Bracing at night encouraged. Follow-up again in 6 to 8 weeks to further evaluate   Updated 12/26/2022 [name] [name] is a 62 y.o. female coming in with complaint of R leg numbness and pain. Chronic issue that has become more prominent. Throbbing burning sensation in R thigh mostly when standing. No treatment has helped thus far.      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   Chronic constipation    Chronic fatigue 06/08/2016   Conjunctiva disorder 08/06/2018   Depression 09/03/2016   GAD (generalized anxiety disorder)    GERD    Herpes zoster  without complication 12/11/2016   History of angioedema    ACE inhibitors   Hx of adenomatous colonic polyps 05/2014   Hyperlipidemia    Hypertension    Lactose intolerance    Migraine headache    Multiple sclerosis, relapsing-remitting (HCC) 11/03/2015   neurologist--- dr Wilnette Kales   Personal history of chemotherapy    PMB (postmenopausal bleeding)    Sleep apnea    Past Surgical History:  Procedure Laterality Date   BREAST BIOPSY Right 2016   x4   COLONOSCOPY  2018   DILATATION & CURETTAGE/HYSTEROSCOPY WITH MYOSURE N/A 09/29/2022   Procedure: DILATATION & CURETTAGE/HYSTEROSCOPY WITH MYOSURE;  Surgeon: Marlow Baars, MD;  Location: [facility] [place];  Service: Gynecology;  Laterality: N/A;   MASTECTOMY Right 2016   PORT-A-CATH REMOVAL Left 05/13/2015   Procedure: REMOVAL PORT-A-CATH;  Surgeon: Emelia Loron, MD;  Location: [facility] OR;  Service: General;  Laterality: Left;   PORTACATH PLACEMENT Left 11/06/2014   Procedure: INSERTION PORT-A-CATH;  Surgeon: Emelia Loron, MD;  Location: [facility];  Service: General;  Laterality: Left;   SIMPLE MASTECTOMY WITH AXILLARY SENTINEL NODE BIOPSY Right 05/13/2015   Procedure: RIGHT TOTAL  MASTECTOMY WITH RIGHT  AXILLARY SENTINEL NODE BIOPSY;  Surgeon: Emelia Loron, MD;  Location: [facility] OR;  Service: General;  Laterality: Right;   TUBAL LIGATION     yrs ago   Social History   Socioeconomic History   Marital status: Single    Spouse name: Not on file   Number of children: 2   Years of education: 12   Highest education level: Not on file  Occupational History   Occupation: Engineer, drillingDispatcher    Employer: [employer]  Tobacco Use   Smoking status: Never   Smokeless tobacco: Never  Vaping Use   Vaping Use: Never used  Substance and Sexual Activity   Alcohol use: No    Alcohol/week: 0.0 standard drinks of alcohol   Drug use: Never   Sexual activity: Not on file  Other Topics Concern   Not on file   Social History Narrative   Patient is a Administrator, 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    Patient has a [employer] education.    Patient is single and lives alone.    Patient is right handed.    Social Determinants of Health   Financial Resource Strain: Not on file  Food Insecurity: Not on file  Transportation Needs: Not on file  Physical Activity: Not on file  Stress: Not on file  Social Connections: Not on file   Allergies  Allergen Reactions   Ace Inhibitors Anaphylaxis     Angioedema (tongue swelling)   Clarithromycin Anaphylaxis    Throat swells   Contrave [Naltrexone-Bupropion Hcl Er] Shortness Of Breath    Chest and sob   Levaquin [Levofloxacin] Other (See Comments)    Tendon pain - shoulder and calf   Family History  Problem Relation Age of Onset   Coronary artery disease Mother    Heart disease Mother    Diabetes Mother    Hypertension Mother    Stroke Mother    Alcohol abuse Father    COPD Maternal Grandmother    Hypertension Other    Hyperlipidemia Other    Colon cancer Neg Hx    Rectal cancer Neg Hx    Stomach cancer Neg Hx    Esophageal cancer Neg Hx    Breast cancer Neg Hx      Current Outpatient Medications (Cardiovascular):    hydrochlorothiazide (HYDRODIURIL) 25 MG tablet, TAKE 1 TABLET (25 MG TOTAL) BY MOUTH DAILY.   losartan (COZAAR) 50 MG tablet, Take 1 tablet (50 mg total) by mouth daily.   metoprolol tartrate (LOPRESSOR) 50 MG tablet, TAKE 1 TABLET BY MOUTH TWICE A DAY (Patient taking differently: Take 50 mg by mouth 2 (two) times daily.)  Current Outpatient Medications (Respiratory):    albuterol (VENTOLIN HFA) 108 (90 Base) MCG/ACT inhaler, Inhale 2 puffs into the lungs every 6 (six) hours as needed for wheezing or shortness of breath.   Azelastine HCl 137 MCG/SPRAY SOLN, PLACE 2 SPRAYS INTO BOTH NOSTRILS 2 (TWO) TIMES DAILY. USE IN EACH NOSTRIL AS DIRECTED (Patient taking differently: Place 2 sprays into the nose 2 (two) times daily.)    budesonide-formoterol (SYMBICORT) 160-4.5 MCG/ACT inhaler, Inhale 2 puffs into the lungs 2 (two) times daily. (Patient taking differently: Inhale 2 puffs into the lungs 2 (two) times daily as needed.)   fluticasone (FLONASE) 50 MCG/ACT nasal spray, SHAKE LIQUID AND USE 2 SPRAYS IN EACH NOSTRIL DAILY   ipratropium (ATROVENT) 0.06 % nasal spray, Place 2 sprays into both nostrils 4 (four) times daily as needed (nasal congestion/drainage).   loratadine (CLARITIN) 10 MG tablet, Take 10 mg by mouth daily as needed for itching, rhinitis or allergies.   Current Outpatient Medications (Hematological):    ferrous gluconate (FERGON) 324 MG tablet, Take 1 tablet (324 mg total) by mouth 2 (two) times daily with a meal. (Patient taking differently: Take 324 mg by mouth 3 (three) times a week. PER PT TAKES MON/ WED/ FRIDAY TWICE DAILY)   XARELTO 20 MG TABS  tablet, TAKE 1 TABLET(20 MG) BY MOUTH DAILY WITH SUPPER  Current Outpatient Medications (Other):    diclofenac Sodium (PENNSAID) 2 % SOLN, Apply 2 Pump (40 mg total) topically 2 (two) times daily as needed.   gabapentin (NEURONTIN) 300 MG capsule, Take 1 capsule (300 mg total) by mouth at bedtime.   ALPRAZolam (XANAX) 0.5 MG tablet, Take 1 tablet (0.5 mg total) by mouth 2 (two) times daily as needed for anxiety or sleep.   b complex vitamins tablet, Take 1 tablet by mouth daily.   Cholecalciferol (VITAMIN D3) 50 MCG (2000 UT) TABS, Take 1 tablet by mouth daily.   COPAXONE 40 MG/ML SOSY, INJECT ONE SYRINGE SUBCUTANEOUSLY THREE TIMES PER WEEK AT LEAST 48 HOURS APART. ALLOW SYRINGE TO WARM TO ROOM TEMPERATURE FOR 20 MINUTES. REFRIGERATE. (Patient taking differently: Inject 40 mg as directed 3 (three) times a week. INJECT ONE SYRINGE SUBCUTANEOUSLY THREE TIMES PER WEEK AT LEAST 48 HOURS APART. ALLOW SYRINGE TO WARM TO ROOM TEMPERATURE FOR 20 MINUTES. REFRIGERATE.)   gabapentin (NEURONTIN) 100 MG capsule, TAKE 3 CAPSULES BY MOUTH 3 TIMES DAILY. (Patient taking  differently: Take 300 mg by mouth 3 (three) times daily.)   LINZESS 145 MCG CAPS capsule, TAKE 1 CAPSULE BY MOUTH EVERY DAY BEFORE BREAKFAST (Patient taking differently: Take 145 mcg by mouth daily before breakfast.)   pantoprazole (PROTONIX) 40 MG tablet, TAKE 1 TABLET BY MOUTH EVERY DAY   potassium chloride (KLOR-CON M10) 10 MEQ tablet, TAKE 4 TABLETS BY MOUTH EVERY DAY   Semaglutide-Weight Management (WEGOVY) 0.25 MG/0.5ML SOAJ, Inject 0.25 mg into the skin once a week.   Tart Cherry 1200 MG CAPS, Take 1 capsule by mouth at bedtime.   TURMERIC PO, Take 1 capsule by mouth daily.   valACYclovir (VALTREX) 500 MG tablet, Take 500 mg by mouth 2 (two) times daily as needed.   Reviewed prior external information including notes and imaging from  primary care provider As well as notes that were available from care everywhere and other healthcare systems.  Past medical history, social, surgical and family history all reviewed in electronic medical record.  No pertanent information unless stated regarding to the chief complaint.   Review of Systems:  No headache, visual changes, nausea, vomiting, diarrhea, constipation, dizziness, abdominal pain, skin rash, fevers, chills, night sweats, weight loss, swollen lymph nodes, body aches, joint swelling, chest pain, shortness of breath, mood changes. POSITIVE muscle aches  Objective  Height  (1.651 m), last menstrual period 11/01/2012.   General: No apparent distress alert and oriented x3 mood and affect normal, dressed appropriately.  HEENT: Pupils equal, extraocular movements intact  Respiratory: Patient's speak in full sentences and does not appear short of breath  Cardiovascular: No lower extremity edema, non tender, no erythema  Patient does have tenderness to palpation over the greater trochanteric area as well as on the lower part of the back.  Mild positive straight leg test noted.  Tender to palpation over the knee as well.  Mild crepitus  of the patellofemoral with lateral tracking    Impression and Recommendations:    The above documentation has been reviewed and is accurate and complete Judi Saa, DO

## 2022-12-26 NOTE — Assessment & Plan Note (Addendum)
Very concerned still with a lumbar radiculopathy.  Patient's x-rays do show an L4-L5 and L5-S1 degenerative disc disease and facet arthropathy that could be contributing.  We discussed if this continues to give her difficulty we do need to consider the possibility of an MRI.  Patient would like to hold on this.  Underlying multiple sclerosis is there.  Toradol and Depo-Medrol given today that I hope will be beneficial.  Can do also a prescription of prednisone if necessary.  We discussed continuing to be active otherwise.  Follow-up with me again in 6 to 8 weeks increase patient's gabapentin to 300 mg twice daily

## 2023-01-02 ENCOUNTER — Other Ambulatory Visit: Payer: Self-pay

## 2023-01-02 ENCOUNTER — Encounter: Payer: Self-pay | Admitting: Family Medicine

## 2023-01-02 DIAGNOSIS — M25551 Pain in right hip: Secondary | ICD-10-CM

## 2023-01-02 DIAGNOSIS — M545 Low back pain, unspecified: Secondary | ICD-10-CM

## 2023-01-03 ENCOUNTER — Ambulatory Visit (INDEPENDENT_AMBULATORY_CARE_PROVIDER_SITE_OTHER): Payer: BC Managed Care – PPO

## 2023-01-03 DIAGNOSIS — M25551 Pain in right hip: Secondary | ICD-10-CM | POA: Diagnosis not present

## 2023-01-03 DIAGNOSIS — M25552 Pain in left hip: Secondary | ICD-10-CM

## 2023-01-03 DIAGNOSIS — M545 Low back pain, unspecified: Secondary | ICD-10-CM | POA: Diagnosis not present

## 2023-01-04 ENCOUNTER — Ambulatory Visit: Payer: BC Managed Care – PPO | Admitting: Cardiovascular Disease

## 2023-01-09 ENCOUNTER — Telehealth: Payer: Self-pay | Admitting: Family Medicine

## 2023-01-09 ENCOUNTER — Other Ambulatory Visit: Payer: Self-pay

## 2023-01-09 ENCOUNTER — Ambulatory Visit: Payer: BC Managed Care – PPO | Admitting: Family Medicine

## 2023-01-09 MED ORDER — GABAPENTIN 300 MG PO CAPS: 300.0000 mg | ORAL_CAPSULE | Freq: Two times a day (BID) | ORAL | 0 refills | Status: DC

## 2023-01-09 NOTE — Telephone Encounter (Signed)
At her last visit she was told to take Gabapentin  twice a day but the prescription was sent in for one  at day.  Patient wanted to confirm that she should be taking two?  She has also been taking s of Ibuprofen in between doses of Tylenol which has helped her leg pain greatly. She asked if we would be able to send in Ibuprofen  to her pharmacy?  Please advise.  (Okay to message through MyChart)

## 2023-01-09 NOTE — Telephone Encounter (Signed)
Rx filled. Patient notified.  

## 2023-01-18 ENCOUNTER — Encounter: Payer: Self-pay | Admitting: Internal Medicine

## 2023-01-18 NOTE — Progress Notes (Signed)
Subjective:    Patient ID: [name] [name], female    DOB: May 17, 1961, 62 y.o.   MRN: [id number]      HPI [name] [name] is here for  Chief Complaint  Patient presents with   Sinusitis    Sinus headache, nasal drainage and bad cough at night (not sleeping)    She is here for an acute visit for cold symptoms.   Her symptoms started 1 week ago  She is experiencing fatigue, fever, PND, sinus pressure, sore throat initially, cough that is dry, except at night she will bring up some thick mucus, wheezing, headaches and lightheadedness.  She has been using her Symbicort.  She has not needed her inhaler.     Medications and allergies reviewed with patient and updated if appropriate.  Current Outpatient Medications on File Prior to Visit  Medication Sig Dispense Refill   albuterol (VENTOLIN HFA) 108 (90 Base) MCG/ACT inhaler Inhale 2 puffs into the lungs every 6 (six) hours as needed for wheezing or shortness of breath. 8 g 5   ALPRAZolam (XANAX) 0.5 MG tablet Take 1 tablet (0.5 mg total) by mouth 2 (two) times daily as needed for anxiety or sleep. 30 tablet 0   Azelastine HCl 137 MCG/SPRAY SOLN PLACE 2 SPRAYS INTO BOTH NOSTRILS 2 (TWO) TIMES DAILY. USE IN EACH NOSTRIL AS DIRECTED (Patient taking differently: Place 2 sprays into the nose 2 (two) times daily.) 30 mL 2   b complex vitamins tablet Take 1 tablet by mouth daily.     budesonide-formoterol (SYMBICORT) 160-4.5 MCG/ACT inhaler Inhale 2 puffs into the lungs 2 (two) times daily. (Patient taking differently: Inhale 2 puffs into the lungs 2 (two) times daily as needed.) 10.2 g 5   Cholecalciferol (VITAMIN D3) 50 MCG (2000 UT) TABS Take 1 tablet by mouth daily.     COPAXONE 40 MG/ML SOSY INJECT ONE SYRINGE SUBCUTANEOUSLY THREE TIMES PER WEEK AT LEAST 48 HOURS APART. ALLOW SYRINGE TO WARM TO ROOM TEMPERATURE FOR 20 MINUTES. REFRIGERATE. (Patient taking differently: Inject 40 mg as directed 3 (three) times a week. INJECT ONE SYRINGE  SUBCUTANEOUSLY THREE TIMES PER WEEK AT LEAST 48 HOURS APART. ALLOW SYRINGE TO WARM TO ROOM TEMPERATURE FOR 20 MINUTES. REFRIGERATE.) 36 mL 2   diclofenac Sodium (PENNSAID) 2 % SOLN Apply 2 Pump (40 mg total) topically 2 (two) times daily as needed. 112 g 0   ferrous gluconate (FERGON) 324 MG tablet Take 1 tablet (324 mg total) by mouth 2 (two) times daily with a meal. (Patient taking differently: Take 324 mg by mouth 3 (three) times a week. PER PT TAKES MON/ WED/ FRIDAY TWICE DAILY) 180 tablet 3   fluticasone (FLONASE) 50 MCG/ACT nasal spray SHAKE LIQUID AND USE 2 SPRAYS IN EACH NOSTRIL DAILY 48 mL 2   gabapentin (NEURONTIN) 300 MG capsule Take 1 capsule (300 mg total) by mouth 2 (two) times daily. 180 capsule 0   hydrochlorothiazide (HYDRODIURIL) 25 MG tablet TAKE 1 TABLET (25 MG TOTAL) BY MOUTH DAILY. 90 tablet 1   ipratropium (ATROVENT) 0.06 % nasal spray Place 2 sprays into both nostrils 4 (four) times daily as needed (nasal congestion/drainage). 15 mL 5   LINZESS 145 MCG CAPS capsule TAKE 1 CAPSULE BY MOUTH EVERY DAY BEFORE BREAKFAST (Patient taking differently: Take 145 mcg by mouth daily before breakfast.) 90 capsule 1   loratadine (CLARITIN) 10 MG tablet Take 10 mg by mouth daily as needed for itching, rhinitis or allergies.     losartan (COZAAR) 50  MG tablet Take 1 tablet (50 mg total) by mouth daily. 90 tablet 3   metoprolol tartrate (LOPRESSOR) 50 MG tablet TAKE 1 TABLET BY MOUTH TWICE A DAY (Patient taking differently: Take 50 mg by mouth 2 (two) times daily.) 180 tablet 2   pantoprazole (PROTONIX) 40 MG tablet TAKE 1 TABLET BY MOUTH EVERY DAY 90 tablet 1   potassium chloride (KLOR-CON M10) 10 MEQ tablet TAKE 4 TABLETS BY MOUTH EVERY DAY 360 tablet 1   Semaglutide-Weight Management (WEGOVY) 0.25 MG/0.5ML SOAJ Inject 0.25 mg into the skin once a week. 2 mL 0   Tart Cherry 1200 MG CAPS Take 1 capsule by mouth at bedtime.     TURMERIC PO Take 1 capsule by mouth daily.     valACYclovir  (VALTREX) 500 MG tablet Take 500 mg by mouth 2 (two) times daily as needed.     XARELTO 20 MG TABS tablet TAKE 1 TABLET(20 MG) BY MOUTH DAILY WITH SUPPER 90 tablet 1   No current facility-administered medications on file prior to visit.    Review of Systems  Constitutional:  Positive for fatigue and fever.  HENT:  Positive for postnasal drip, sinus pressure and sore throat. Negative for congestion and ear pain.   Respiratory:  Positive for cough (at night - some mucus- thick) and wheezing. Negative for shortness of breath.   Neurological:  Positive for light-headedness and headaches.       Objective:   Vitals:   01/19/23 1510  BP: (!) 140/80  Pulse: (!) 103  Temp: 98.5 F (36.9 C)  SpO2: 98%   BP Readings from Last 3 Encounters:  01/19/23 (!) 140/80  12/11/22 127/82  11/10/22 120/78   Wt Readings from Last 3 Encounters:  01/19/23 218 lb (98.9 kg)  12/11/22 218 lb (98.9 kg)  11/10/22 220 lb (99.8 kg)   Body mass index is 36.28 kg/m.    Physical Exam Constitutional:      General: She is not in acute distress.    Appearance: Normal appearance. She is not ill-appearing.  HENT:     Head: Normocephalic and atraumatic.     Right Ear: Tympanic membrane, ear canal and external ear normal.     Left Ear: Tympanic membrane, ear canal and external ear normal.     Mouth/Throat:     Mouth: Mucous membranes are moist.     Pharynx: No oropharyngeal exudate or posterior oropharyngeal erythema.  Eyes:     Conjunctiva/sclera: Conjunctivae normal.  Cardiovascular:     Rate and Rhythm: Normal rate and regular rhythm.  Pulmonary:     Effort: Pulmonary effort is normal. No respiratory distress.     Breath sounds: Normal breath sounds. No wheezing or rales.  Musculoskeletal:     Cervical back: Neck supple. No tenderness.  Lymphadenopathy:     Cervical: No cervical adenopathy.  Skin:    General: Skin is warm and dry.  Neurological:     Mental Status: She is alert.             Assessment & Plan:    See Problem List for Assessment and Plan of chronic medical problems.

## 2023-01-19 ENCOUNTER — Ambulatory Visit: Payer: BC Managed Care – PPO | Admitting: Internal Medicine

## 2023-01-19 VITALS — BP 140/80 | HR 103 | Temp 98.5°F | Ht 65.0 in | Wt 218.0 lb

## 2023-01-19 DIAGNOSIS — J0100 Acute maxillary sinusitis, unspecified: Secondary | ICD-10-CM

## 2023-01-19 DIAGNOSIS — I10 Essential (primary) hypertension: Secondary | ICD-10-CM

## 2023-01-19 DIAGNOSIS — J45901 Unspecified asthma with (acute) exacerbation: Secondary | ICD-10-CM | POA: Insufficient documentation

## 2023-01-19 MED ORDER — AMOXICILLIN-POT CLAVULANATE 875-125 MG PO TABS: 1.0000 | ORAL_TABLET | Freq: Two times a day (BID) | ORAL | 0 refills | Status: AC

## 2023-01-19 MED ORDER — IBUPROFEN 800 MG PO TABS: 800.0000 mg | ORAL_TABLET | Freq: Three times a day (TID) | ORAL | 1 refills | Status: DC | PRN

## 2023-01-19 MED ORDER — HYDROCOD POLI-CHLORPHE POLI ER 10-8 MG/5ML PO SUER: 5.0000 mL | Freq: Two times a day (BID) | ORAL | 0 refills | Status: DC | PRN

## 2023-01-19 MED ORDER — FLUCONAZOLE 150 MG PO TABS: 150.0000 mg | ORAL_TABLET | Freq: Once | ORAL | 0 refills | Status: AC

## 2023-01-19 NOTE — Assessment & Plan Note (Signed)
Acute Mild exacerbation of her asthma-wheezing on exam and cough secondary to sinus infection Continue Symbicort inhaler 160-4.5 mcg per ACT 2 puffs twice daily Albuterol as needed Augmentin 875-125 mg twice daily x 7 days Tussionex cough syrup for nighttime Call if no improvement

## 2023-01-19 NOTE — Patient Instructions (Addendum)
       Medications changes include :   Augmentin, tussionex, fluconazole    Return if symptoms worsen or fail to improve.

## 2023-01-19 NOTE — Assessment & Plan Note (Signed)
Chronic Blood pressure slightly elevated here today, but typically much better controlled-last month blood pressure was 127/82 in the month prior 128/78 No change in medications Continue metoprolol 50 mg twice daily, losartan 50 mg daily, HCTZ 25 mg daily

## 2023-01-19 NOTE — Assessment & Plan Note (Signed)
Acute With mild exacerbation of her asthma-wheezing on exam and cough  Continue Symbicort inhaler 160-4.5 mcg per ACT 2 puffs twice daily Albuterol as needed Augmentin 875-125 mg twice daily x 7 days Tussionex cough syrup for nighttime Call if no improvement

## 2023-01-23 ENCOUNTER — Encounter: Payer: Self-pay | Admitting: Internal Medicine

## 2023-01-23 MED ORDER — PREDNISONE 10 MG PO TABS
ORAL_TABLET | ORAL | 0 refills | Status: DC
Start: 2023-01-23 — End: 2023-03-02

## 2023-01-23 NOTE — Addendum Note (Signed)
Addended by: Pincus Sanes on: 01/23/2023 07:06 PM   Modules accepted: Orders

## 2023-01-23 NOTE — Progress Notes (Unsigned)
[name] [address] [place] [address] Phone: [phone] Subjective:   [name] [name], am serving as a scribe for Dr. Antoine Primas.  I'm seeing this patient by the request  of:  Pincus Sanes, MD  CC: Low back pain  [id number]  12/26/2022 Very concerned still with a lumbar radiculopathy.  Patient's x-rays do show an L4-L5 and L5-S1 degenerative disc disease and facet arthropathy that could be contributing.  We discussed if this continues to give her difficulty we do need to consider the possibility of an MRI.  Patient would like to hold on this.  Underlying multiple sclerosis is there.  Toradol and Depo-Medrol given today that I hope will be beneficial.  Can do also a prescription of prednisone if necessary.  We discussed continuing to be active otherwise.  Follow-up with me again in 6 to 8 weeks increase patient's gabapentin to 300 mg twice daily   Updated 01/24/2023 [name] [name] is a 62 y.o. female coming in with complaint of back pain, last visit was to increase gabapentin to 300 mg twice a day. Patient has been tolerating pain in her back and leg. Pain is worse when she is standing up. Pain shooting down leg on Monday, 10/10 pain. Would like do discuss shoes and back bracing. Might need another cocktail.    Most recent x-rays recently April of this year showed the patient did have progression of degenerative changes at L4-L5 and facet arthropathy.  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   Chronic constipation    Chronic fatigue 06/08/2016   Conjunctiva disorder 08/06/2018   Depression 09/03/2016   GAD (generalized anxiety disorder)    GERD    Herpes zoster without complication 12/11/2016   History of angioedema    ACE inhibitors   Hx of adenomatous colonic polyps 05/2014   Hyperlipidemia    Hypertension    Lactose intolerance    Migraine headache    Multiple sclerosis, relapsing-remitting (HCC) 11/03/2015   neurologist--- dr Wilnette Kales   Personal history of chemotherapy    PMB (postmenopausal bleeding)    Sleep apnea    Past Surgical History:  Procedure Laterality Date   BREAST BIOPSY Right 2016   x4   COLONOSCOPY  2018   DILATATION & CURETTAGE/HYSTEROSCOPY WITH MYOSURE N/A 09/29/2022   Procedure: DILATATION & CURETTAGE/HYSTEROSCOPY WITH MYOSURE;  Surgeon: Marlow Baars, MD;  Location: [facility];  Service: Gynecology;  Laterality: N/A;   MASTECTOMY Right 2016   PORT-A-CATH REMOVAL Left 05/13/2015   Procedure: REMOVAL PORT-A-CATH;  Surgeon: Emelia Loron, MD;  Location: [facility] OR;  Service: General;  Laterality: Left;   PORTACATH PLACEMENT Left 11/06/2014   Procedure: INSERTION PORT-A-CATH;  Surgeon: Emelia Loron, MD;  Location: [facility];  Service: General;  Laterality: Left;   SIMPLE MASTECTOMY WITH AXILLARY SENTINEL NODE BIOPSY Right 05/13/2015   Procedure: RIGHT TOTAL  MASTECTOMY WITH RIGHT  AXILLARY SENTINEL NODE  BIOPSY;  Surgeon: Emelia Loron, MD;  Location: [facility] OR;  Service: General;  Laterality: Right;   TUBAL LIGATION     yrs ago   Social History   Socioeconomic History   Marital status: Single    Spouse name: Not on file   Number of children: 2   Years of education: 12   Highest education level: Not on file  Occupational History   Occupation: Engineer, drilling: [employer]   Tobacco Use   Smoking status: Never   Smokeless tobacco: Never  Vaping Use   Vaping Use: Never used  Substance and Sexual Activity   Alcohol use: No    Alcohol/week: 0.0 standard drinks of alcohol   Drug use: Never   Sexual activity: Not on file  Other Topics Concern   Not on file  Social History Narrative   Patient is a Administrator, 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    Patient is single and lives alone.    Patient is right handed.    Social Determinants of Health   Financial Resource Strain: Not on file  Food Insecurity: Not on file  Transportation Needs: Not on file  Physical Activity: Not on file  Stress: Not on file  Social Connections: Not on file   Allergies  Allergen Reactions   Ace Inhibitors Anaphylaxis     Angioedema (tongue swelling)   Clarithromycin Anaphylaxis    Throat swells   Contrave [Naltrexone-Bupropion Hcl Er] Shortness Of Breath    Chest and sob   Levaquin [Levofloxacin] Other (See Comments)    Tendon pain - shoulder and calf   Family History  Problem Relation Age of Onset   Coronary artery disease Mother    Heart disease Mother    Diabetes Mother    Hypertension Mother    Stroke Mother    Alcohol abuse Father    COPD Maternal Grandmother    Hypertension Other    Hyperlipidemia Other    Colon cancer Neg Hx    Rectal cancer Neg Hx    Stomach cancer Neg Hx    Esophageal cancer Neg Hx    Breast cancer Neg Hx     Current Outpatient Medications (Endocrine & Metabolic):    predniSONE (DELTASONE) 10 MG tablet, Take 4 tabs po qd x 2 days, then 3 tabs po qd x 2 days, then 2 tabs po qd x 2 days, then 1 tab po qd x 2 days  Current Outpatient Medications (Cardiovascular):    hydrochlorothiazide (HYDRODIURIL) 25 MG tablet, TAKE 1 TABLET (25 MG TOTAL) BY MOUTH DAILY.   losartan (COZAAR) 50 MG tablet, Take 1 tablet (50 mg total) by mouth daily.   metoprolol tartrate (LOPRESSOR) 50 MG tablet, TAKE 1 TABLET BY  MOUTH TWICE A DAY (Patient taking differently: Take 50 mg by mouth 2 (two) times daily.)  Current Outpatient Medications (Respiratory):    albuterol (VENTOLIN HFA) 108 (90 Base) MCG/ACT inhaler, Inhale 2 puffs into the lungs every 6 (six) hours as needed for wheezing or shortness of breath.   Azelastine HCl 137 MCG/SPRAY SOLN, PLACE 2 SPRAYS INTO BOTH NOSTRILS 2 (TWO) TIMES DAILY. USE IN EACH NOSTRIL AS DIRECTED (Patient taking differently: Place 2 sprays into the nose 2 (two) times daily.)   budesonide-formoterol (SYMBICORT) 160-4.5 MCG/ACT inhaler, Inhale 2 puffs into the lungs 2 (two) times daily. (Patient taking differently: Inhale 2 puffs into the lungs 2 (two) times daily as needed.)   chlorpheniramine-HYDROcodone (  TUSSIONEX) 10-8 MG/5ML, Take 5 mLs by mouth every 12 (twelve) hours as needed.   fluticasone (FLONASE) 50 MCG/ACT nasal spray, SHAKE LIQUID AND USE 2 SPRAYS IN EACH NOSTRIL DAILY   ipratropium (ATROVENT) 0.06 % nasal spray, Place 2 sprays into both nostrils 4 (four) times daily as needed (nasal congestion/drainage).   loratadine (CLARITIN) 10 MG tablet, Take 10 mg by mouth daily as needed for itching, rhinitis or allergies.  Current Outpatient Medications (Analgesics):    ibuprofen (ADVIL) 800 MG tablet, Take 1 tablet (800 mg total) by mouth every 8 (eight) hours as needed.  Current Outpatient Medications (Hematological):    ferrous gluconate (FERGON) 324 MG tablet, Take 1 tablet (324 mg total) by mouth 2 (two) times daily with a meal. (Patient taking differently: Take 324 mg by mouth 3 (three) times a week. PER PT TAKES MON/ WED/ FRIDAY TWICE DAILY)   XARELTO 20 MG TABS tablet, TAKE 1 TABLET(20 MG) BY MOUTH DAILY WITH SUPPER  Current Outpatient Medications (Other):    ALPRAZolam (XANAX) 0.5 MG tablet, Take 1 tablet (0.5 mg total) by mouth 2 (two) times daily as needed for anxiety or sleep.   amoxicillin-clavulanate (AUGMENTIN) 875-125 MG tablet, Take 1 tablet by mouth 2 (two)  times daily for 7 days.   b complex vitamins tablet, Take 1 tablet by mouth daily.   Cholecalciferol (VITAMIN D3) 50 MCG (2000 UT) TABS, Take 1 tablet by mouth daily.   COPAXONE 40 MG/ML SOSY, INJECT ONE SYRINGE SUBCUTANEOUSLY THREE TIMES PER WEEK AT LEAST 48 HOURS APART. ALLOW SYRINGE TO WARM TO ROOM TEMPERATURE FOR 20 MINUTES. REFRIGERATE. (Patient taking differently: Inject 40 mg as directed 3 (three) times a week. INJECT ONE SYRINGE SUBCUTANEOUSLY THREE TIMES PER WEEK AT LEAST 48 HOURS APART. ALLOW SYRINGE TO WARM TO ROOM TEMPERATURE FOR 20 MINUTES. REFRIGERATE.)   diclofenac Sodium (PENNSAID) 2 % SOLN, Apply 2 Pump (40 mg total) topically 2 (two) times daily as needed.   gabapentin (NEURONTIN) 300 MG capsule, Take 1 capsule (300 mg total) by mouth 2 (two) times daily.   LINZESS 145 MCG CAPS capsule, TAKE 1 CAPSULE BY MOUTH EVERY DAY BEFORE BREAKFAST (Patient taking differently: Take 145 mcg by mouth daily before breakfast.)   pantoprazole (PROTONIX) 40 MG tablet, TAKE 1 TABLET BY MOUTH EVERY DAY   potassium chloride (KLOR-CON M10) 10 MEQ tablet, TAKE 4 TABLETS BY MOUTH EVERY DAY   Semaglutide-Weight Management (WEGOVY) 0.25 MG/0.5ML SOAJ, Inject 0.25 mg into the skin once a week.   Tart Cherry 1200 MG CAPS, Take 1 capsule by mouth at bedtime.   TURMERIC PO, Take 1 capsule by mouth daily.   valACYclovir (VALTREX) 500 MG tablet, Take 500 mg by mouth 2 (two) times daily as needed.   Reviewed prior external information including notes and imaging from  primary care provider As well as notes that were available from care everywhere and other healthcare systems.  Past medical history, social, surgical and family history all reviewed in electronic medical record.  No pertanent information unless stated regarding to the chief complaint.   Review of Systems:  No headache, visual changes, nausea, vomiting, diarrhea, constipation, dizziness, abdominal pain, skin rash, fevers, chills, night sweats,  weight loss, swollen lymph nodes, body aches, joint swelling, chest pain, shortness of breath, mood changes. POSITIVE muscle aches  Objective  Blood pressure 110/76, pulse 80, height 5\' 5"  (1.651 m), weight 215 lb (97.5 kg), last menstrual period 11/01/2012, SpO2 98 %.   General: No apparent distress alert and  oriented x3 mood and affect normal, dressed appropriately.  HEENT: Pupils equal, extraocular movements intact  Respiratory: Patient's speak in full sentences and does not appear short of breath  Cardiovascular: No lower extremity edema, non tender, no erythema  Low back exam shows patient does have some loss of lordosis.  Tightness with straight leg test on the right side.  Radicular symptoms in the L4 and L5 distribution no significant weakness.  Positive Faber on the right side.    Impression and Recommendations:     The above documentation has been reviewed and is accurate and complete Judi Saa, DO

## 2023-01-24 ENCOUNTER — Ambulatory Visit: Payer: BC Managed Care – PPO | Admitting: Internal Medicine

## 2023-01-24 ENCOUNTER — Ambulatory Visit: Payer: BC Managed Care – PPO | Admitting: Family Medicine

## 2023-01-24 ENCOUNTER — Encounter: Payer: Self-pay | Admitting: Family Medicine

## 2023-01-24 VITALS — BP 110/76 | HR 80 | Ht 65.0 in | Wt 215.0 lb

## 2023-01-24 DIAGNOSIS — M5416 Radiculopathy, lumbar region: Secondary | ICD-10-CM | POA: Diagnosis not present

## 2023-01-24 DIAGNOSIS — M545 Low back pain, unspecified: Secondary | ICD-10-CM

## 2023-01-24 MED ORDER — KETOROLAC TROMETHAMINE 60 MG/2ML IM SOLN
60.0000 mg | Freq: Once | INTRAMUSCULAR | Status: AC
Start: 2023-01-24 — End: 2023-01-24
  Administered 2023-01-24: 60 mg via INTRAMUSCULAR

## 2023-01-24 MED ORDER — METHYLPREDNISOLONE ACETATE 80 MG/ML IJ SUSP
80.0000 mg | Freq: Once | INTRAMUSCULAR | Status: AC
Start: 2023-01-24 — End: 2023-01-24
  Administered 2023-01-24: 80 mg via INTRAMUSCULAR

## 2023-01-24 NOTE — Patient Instructions (Addendum)
Injections backside Altra shoes or Gravity [email or web address] When you want to consider MRI, write [place] We can send in Valum as well See me as needed

## 2023-01-24 NOTE — Assessment & Plan Note (Signed)
Chronic, with exacerbation again.  Patient's differential does include MS spinal concern for more of a lumbar radiculopathy.  We discussed with patient about the possibility of advanced imaging but she would like to hold on it longer.  Patient symptoms is consistent with a nerve impingement.  We discussed with patient about if she needs a open MRI we can consider this.  Toradol and Depo-Medrol given as well.  Discussed if worsening symptoms to seek medical attention immediately.  Patient is in agreement with the plan.  Is going to schedule a follow-up

## 2023-01-24 NOTE — Telephone Encounter (Signed)
During follow up on to ensure [name] [name] was properly transferred to Dr. Tresa Endo I see that she canceled the appointment we scheduled with Dr. Tresa Endo for April 2024. After further review, I see that she established care with [facility] in March 2024 for sleep medicine. At this time, I don't see a recall or follow up that is necessary for her to be rescheduled with Dr. Tresa Endo unless she calls back to request so.

## 2023-01-26 ENCOUNTER — Encounter: Payer: Self-pay | Admitting: Family Medicine

## 2023-01-27 ENCOUNTER — Encounter: Payer: Self-pay | Admitting: Internal Medicine

## 2023-01-29 ENCOUNTER — Other Ambulatory Visit: Payer: Self-pay

## 2023-01-29 MED ORDER — ALBUTEROL SULFATE HFA 108 (90 BASE) MCG/ACT IN AERS: 2.0000 | INHALATION_SPRAY | Freq: Four times a day (QID) | RESPIRATORY_TRACT | 5 refills | Status: AC | PRN

## 2023-01-29 MED ORDER — BUDESONIDE-FORMOTEROL FUMARATE 160-4.5 MCG/ACT IN AERO: 2.0000 | INHALATION_SPRAY | Freq: Two times a day (BID) | RESPIRATORY_TRACT | 5 refills | Status: AC

## 2023-02-02 ENCOUNTER — Encounter: Payer: Self-pay | Admitting: Internal Medicine

## 2023-02-09 ENCOUNTER — Ambulatory Visit: Payer: BC Managed Care – PPO | Admitting: Internal Medicine

## 2023-02-09 NOTE — Progress Notes (Deleted)
Subjective:    Patient ID: [name] [name], female    DOB: March 27, 1961, 62 y.o.   MRN: [id number]      HPI [name] [name] is here for No chief complaint on file.    I saw her 5/3 for sinus infection x 1 week, asthma exacerbation and treated her with Augmentin x 7 days, Hycodan cough syrup.  She was using her inhalers.  She is still having a cough    Medications and allergies reviewed with patient and updated if appropriate.  Current Outpatient Medications on File Prior to Visit  Medication Sig Dispense Refill   albuterol (VENTOLIN HFA) 108 (90 Base) MCG/ACT inhaler Inhale 2 puffs into the lungs every 6 (six) hours as needed for wheezing or shortness of breath. 8 g 5   ALPRAZolam (XANAX) 0.5 MG tablet Take 1 tablet (0.5 mg total) by mouth 2 (two) times daily as needed for anxiety or sleep. 30 tablet 0   Azelastine HCl 137 MCG/SPRAY SOLN PLACE 2 SPRAYS INTO BOTH NOSTRILS 2 (TWO) TIMES DAILY. USE IN EACH NOSTRIL AS DIRECTED (Patient taking differently: Place 2 sprays into the nose 2 (two) times daily.) 30 mL 2   b complex vitamins tablet Take 1 tablet by mouth daily.     budesonide-formoterol (SYMBICORT) 160-4.5 MCG/ACT inhaler Inhale 2 puffs into the lungs 2 (two) times daily. 10.2 g 5   chlorpheniramine-HYDROcodone (TUSSIONEX) 10-8 MG/5ML Take 5 mLs by mouth every 12 (twelve) hours as needed. 115 mL 0   Cholecalciferol (VITAMIN D3) 50 MCG (2000 UT) TABS Take 1 tablet by mouth daily.     COPAXONE 40 MG/ML SOSY INJECT ONE SYRINGE SUBCUTANEOUSLY THREE TIMES PER WEEK AT LEAST 48 HOURS APART. ALLOW SYRINGE TO WARM TO ROOM TEMPERATURE FOR 20 MINUTES. REFRIGERATE. (Patient taking differently: Inject 40 mg as directed 3 (three) times a week. INJECT ONE SYRINGE SUBCUTANEOUSLY THREE TIMES PER WEEK AT LEAST 48 HOURS APART. ALLOW SYRINGE TO WARM TO ROOM TEMPERATURE FOR 20 MINUTES. REFRIGERATE.) 36 mL 2   diclofenac Sodium (PENNSAID) 2 % SOLN Apply 2 Pump (40 mg total) topically 2 (two) times daily as  needed. 112 g 0   ferrous gluconate (FERGON) 324 MG tablet Take 1 tablet (324 mg total) by mouth 2 (two) times daily with a meal. (Patient taking differently: Take 324 mg by mouth 3 (three) times a week. PER PT TAKES MON/ WED/ FRIDAY TWICE DAILY) 180 tablet 3   fluticasone (FLONASE) 50 MCG/ACT nasal spray SHAKE LIQUID AND USE 2 SPRAYS IN EACH NOSTRIL DAILY 48 mL 2   gabapentin (NEURONTIN) 300 MG capsule Take 1 capsule (300 mg total) by mouth 2 (two) times daily. 180 capsule 0   hydrochlorothiazide (HYDRODIURIL) 25 MG tablet TAKE 1 TABLET (25 MG TOTAL) BY MOUTH DAILY. 90 tablet 1   ibuprofen (ADVIL) 800 MG tablet Take 1 tablet (800 mg total) by mouth every 8 (eight) hours as needed. 60 tablet 1   ipratropium (ATROVENT) 0.06 % nasal spray Place 2 sprays into both nostrils 4 (four) times daily as needed (nasal congestion/drainage). 15 mL 5   LINZESS 145 MCG CAPS capsule TAKE 1 CAPSULE BY MOUTH EVERY DAY BEFORE BREAKFAST (Patient taking differently: Take 145 mcg by mouth daily before breakfast.) 90 capsule 1   loratadine (CLARITIN) 10 MG tablet Take 10 mg by mouth daily as needed for itching, rhinitis or allergies.     losartan (COZAAR) 50 MG tablet Take 1 tablet (50 mg total) by mouth daily. 90 tablet 3   metoprolol  tartrate (LOPRESSOR) 50 MG tablet TAKE 1 TABLET BY MOUTH TWICE A DAY (Patient taking differently: Take 50 mg by mouth 2 (two) times daily.) 180 tablet 2   pantoprazole (PROTONIX) 40 MG tablet TAKE 1 TABLET BY MOUTH EVERY DAY 90 tablet 1   potassium chloride (KLOR-CON M10) 10 MEQ tablet TAKE 4 TABLETS BY MOUTH EVERY DAY 360 tablet 1   predniSONE (DELTASONE) 10 MG tablet Take 4 tabs po qd x 2 days, then 3 tabs po qd x 2 days, then 2 tabs po qd x 2 days, then 1 tab po qd x 2 days 20 tablet 0   Semaglutide-Weight Management (WEGOVY) 0.25 MG/0.5ML SOAJ Inject 0.25 mg into the skin once a week. 2 mL 0   Tart Cherry 1200 MG CAPS Take 1 capsule by mouth at bedtime.     TURMERIC PO Take 1 capsule by  mouth daily.     valACYclovir (VALTREX) 500 MG tablet Take 500 mg by mouth 2 (two) times daily as needed.     XARELTO 20 MG TABS tablet TAKE 1 TABLET(20 MG) BY MOUTH DAILY WITH SUPPER 90 tablet 1   No current facility-administered medications on file prior to visit.    Review of Systems     Objective:  There were no vitals filed for this visit. BP Readings from Last 3 Encounters:  01/24/23 110/76  01/19/23 (!) 140/80  12/11/22 127/82   Wt Readings from Last 3 Encounters:  01/24/23 215 lb (97.5 kg)  01/19/23 218 lb (98.9 kg)  12/11/22 218 lb (98.9 kg)   There is no height or weight on file to calculate BMI.    Physical Exam         Assessment & Plan:    See Problem List for Assessment and Plan of chronic medical problems.

## 2023-02-16 ENCOUNTER — Other Ambulatory Visit: Payer: Self-pay | Admitting: *Deleted

## 2023-02-16 MED ORDER — WEGOVY 0.25 MG/0.5ML ~~LOC~~ SOAJ: 0.2500 mg | SUBCUTANEOUS | 0 refills | Status: DC

## 2023-02-16 NOTE — Telephone Encounter (Signed)
Pt is wanting to her a refill on her Wegovy. Inform pt will send to [facility]

## 2023-02-23 ENCOUNTER — Other Ambulatory Visit: Payer: Self-pay | Admitting: Obstetrics

## 2023-02-23 DIAGNOSIS — N644 Mastodynia: Secondary | ICD-10-CM

## 2023-03-01 ENCOUNTER — Encounter: Payer: Self-pay | Admitting: Internal Medicine

## 2023-03-01 NOTE — Patient Instructions (Addendum)
       Medications changes include :   ear drops     Return if symptoms worsen or fail to improve.

## 2023-03-01 NOTE — Progress Notes (Signed)
Subjective:    Patient ID: [name] [name], female    DOB: 1961/01/24, 62 y.o.   MRN: [id number]      HPI Trinh is here for  Chief Complaint  Patient presents with   Ear Pain    Right  ear pain x1 week     Right ear pain - started about 1 week ago. The inside hurts.  It feels like she has a piece of hard of wax in there.    Medications and allergies reviewed with patient and updated if appropriate.  Current Outpatient Medications on File Prior to Visit  Medication Sig Dispense Refill   albuterol (VENTOLIN HFA) 108 (90 Base) MCG/ACT inhaler Inhale 2 puffs into the lungs every 6 (six) hours as needed for wheezing or shortness of breath. 8 g 5   ALPRAZolam (XANAX) 0.5 MG tablet Take 1 tablet (0.5 mg total) by mouth 2 (two) times daily as needed for anxiety or sleep. 30 tablet 0   Azelastine HCl 137 MCG/SPRAY SOLN PLACE 2 SPRAYS INTO BOTH NOSTRILS 2 (TWO) TIMES DAILY. USE IN EACH NOSTRIL AS DIRECTED (Patient taking differently: Place 2 sprays into the nose 2 (two) times daily.) 30 mL 2   b complex vitamins tablet Take 1 tablet by mouth daily.     budesonide-formoterol (SYMBICORT) 160-4.5 MCG/ACT inhaler Inhale 2 puffs into the lungs 2 (two) times daily. 10.2 g 5   chlorpheniramine-HYDROcodone (TUSSIONEX) 10-8 MG/5ML Take 5 mLs by mouth every 12 (twelve) hours as needed. 115 mL 0   Cholecalciferol (VITAMIN D3) 50 MCG (2000 UT) TABS Take 1 tablet by mouth daily.     COPAXONE 40 MG/ML SOSY INJECT ONE SYRINGE SUBCUTANEOUSLY THREE TIMES PER WEEK AT LEAST 48 HOURS APART. ALLOW SYRINGE TO WARM TO ROOM TEMPERATURE FOR 20 MINUTES. REFRIGERATE. (Patient taking differently: Inject 40 mg as directed 3 (three) times a week. INJECT ONE SYRINGE SUBCUTANEOUSLY THREE TIMES PER WEEK AT LEAST 48 HOURS APART. ALLOW SYRINGE TO WARM TO ROOM TEMPERATURE FOR 20 MINUTES. REFRIGERATE.) 36 mL 2   diclofenac Sodium (PENNSAID) 2 % SOLN Apply 2 Pump (40 mg total) topically 2 (two) times daily as needed. 112  g 0   ferrous gluconate (FERGON) 324 MG tablet Take 1 tablet (324 mg total) by mouth 2 (two) times daily with a meal. (Patient taking differently: Take 324 mg by mouth 3 (three) times a week. PER PT TAKES MON/ WED/ FRIDAY TWICE DAILY) 180 tablet 3   fluticasone (FLONASE) 50 MCG/ACT nasal spray SHAKE LIQUID AND USE 2 SPRAYS IN EACH NOSTRIL DAILY 48 mL 2   gabapentin (NEURONTIN) 300 MG capsule Take 1 capsule (300 mg total) by mouth 2 (two) times daily. 180 capsule 0   hydrochlorothiazide (HYDRODIURIL) 25 MG tablet TAKE 1 TABLET (25 MG TOTAL) BY MOUTH DAILY. 90 tablet 1   ibuprofen (ADVIL) 800 MG tablet Take 1 tablet (800 mg total) by mouth every 8 (eight) hours as needed. 60 tablet 1   ipratropium (ATROVENT) 0.06 % nasal spray Place 2 sprays into both nostrils 4 (four) times daily as needed (nasal congestion/drainage). 15 mL 5   LINZESS 145 MCG CAPS capsule TAKE 1 CAPSULE BY MOUTH EVERY DAY BEFORE BREAKFAST (Patient taking differently: Take 145 mcg by mouth daily before breakfast.) 90 capsule 1   loratadine (CLARITIN) 10 MG tablet Take 10 mg by mouth daily as needed for itching, rhinitis or allergies.     losartan (COZAAR) 50 MG tablet Take 1 tablet (50 mg total) by mouth daily.  90 tablet 3   metoprolol tartrate (LOPRESSOR) 50 MG tablet TAKE 1 TABLET BY MOUTH TWICE A DAY (Patient taking differently: Take 50 mg by mouth 2 (two) times daily.) 180 tablet 2   pantoprazole (PROTONIX) 40 MG tablet TAKE 1 TABLET BY MOUTH EVERY DAY 90 tablet 1   potassium chloride (KLOR-CON M10) 10 MEQ tablet TAKE 4 TABLETS BY MOUTH EVERY DAY 360 tablet 1   predniSONE (DELTASONE) 10 MG tablet Take 4 tabs po qd x 2 days, then 3 tabs po qd x 2 days, then 2 tabs po qd x 2 days, then 1 tab po qd x 2 days 20 tablet 0   Semaglutide-Weight Management (WEGOVY) 0.25 MG/0.5ML SOAJ Inject 0.25 mg into the skin once a week. 2 mL 0   Tart Cherry 1200 MG CAPS Take 1 capsule by mouth at bedtime.     TURMERIC PO Take 1 capsule by mouth daily.      valACYclovir (VALTREX) 500 MG tablet Take 500 mg by mouth 2 (two) times daily as needed.     XARELTO 20 MG TABS tablet TAKE 1 TABLET(20 MG) BY MOUTH DAILY WITH SUPPER 90 tablet 1   No current facility-administered medications on file prior to visit.    Review of Systems  Constitutional:  Negative for fever.  HENT:  Positive for ear pain. Negative for congestion, hearing loss, sinus pain and sore throat.   Respiratory:  Positive for cough (in morning - can bring up mucus).   Neurological:  Positive for headaches. Negative for dizziness.       Objective:   Vitals:   03/02/23 0735  BP: 120/82  Pulse: 67  Temp: 98 F (36.7 C)  SpO2: 98%   BP Readings from Last 3 Encounters:  03/02/23 120/82  01/24/23 110/76  01/19/23 (!) 140/80   Wt Readings from Last 3 Encounters:  01/24/23 215 lb (97.5 kg)  01/19/23 218 lb (98.9 kg)  12/11/22 218 lb (98.9 kg)   Body mass index is 35.78 kg/m.    Physical Exam Constitutional:      General: She is not in acute distress.    Appearance: Normal appearance. She is not ill-appearing.  HENT:     Head: Normocephalic and atraumatic.     Right Ear: Tympanic membrane, ear canal and external ear normal. There is no impacted cerumen (light redness and swelling in right ear canal).     Left Ear: Tympanic membrane, ear canal and external ear normal. There is no impacted cerumen.     Mouth/Throat:     Mouth: Mucous membranes are moist.     Pharynx: No oropharyngeal exudate or posterior oropharyngeal erythema.  Eyes:     Conjunctiva/sclera: Conjunctivae normal.  Musculoskeletal:     Cervical back: Neck supple. No tenderness.  Lymphadenopathy:     Cervical: No cervical adenopathy.  Skin:    General: Skin is warm and dry.  Neurological:     Mental Status: She is alert.            Assessment & Plan:    See Problem List for Assessment and Plan of chronic medical problems.

## 2023-03-02 ENCOUNTER — Encounter: Payer: Self-pay | Admitting: Internal Medicine

## 2023-03-02 ENCOUNTER — Ambulatory Visit: Payer: BC Managed Care – PPO | Admitting: Internal Medicine

## 2023-03-02 VITALS — BP 120/82 | HR 67 | Temp 98.0°F | Ht 65.0 in

## 2023-03-02 DIAGNOSIS — I10 Essential (primary) hypertension: Secondary | ICD-10-CM | POA: Diagnosis not present

## 2023-03-02 DIAGNOSIS — H9201 Otalgia, right ear: Secondary | ICD-10-CM | POA: Diagnosis not present

## 2023-03-02 MED ORDER — HYDROCORTISONE-ACETIC ACID 1-2 % OT SOLN
3.0000 [drp] | Freq: Two times a day (BID) | OTIC | 0 refills | Status: DC
Start: 2023-03-02 — End: 2023-05-25

## 2023-03-02 NOTE — Assessment & Plan Note (Signed)
Chronic ?Blood pressure well controlled ?Continue metoprolol 50 mg twice daily, losartan 50 mg daily, HCTZ 25 mg daily ?

## 2023-03-02 NOTE — Assessment & Plan Note (Signed)
Acute Ear canal with mild irritation - slight erythema and swelling - both mild Possibly from scratching in her ear Start acetic acid-hydrocortisone drops

## 2023-03-05 ENCOUNTER — Other Ambulatory Visit: Payer: Self-pay | Admitting: Obstetrics

## 2023-03-05 ENCOUNTER — Ambulatory Visit
Admission: RE | Admit: 2023-03-05 | Discharge: 2023-03-05 | Disposition: A | Discharge status: Home / Self Care | Payer: BC Managed Care – PPO | Source: Ambulatory Visit | Attending: Obstetrics | Admitting: Obstetrics

## 2023-03-05 DIAGNOSIS — N644 Mastodynia: Secondary | ICD-10-CM

## 2023-03-05 DIAGNOSIS — R599 Enlarged lymph nodes, unspecified: Secondary | ICD-10-CM

## 2023-03-05 DIAGNOSIS — N6320 Unspecified lump in the left breast, unspecified quadrant: Secondary | ICD-10-CM

## 2023-03-06 ENCOUNTER — Other Ambulatory Visit: Payer: Self-pay

## 2023-03-06 ENCOUNTER — Ambulatory Visit: Payer: BC Managed Care – PPO | Admitting: Family Medicine

## 2023-03-06 ENCOUNTER — Encounter: Payer: Self-pay | Admitting: Family Medicine

## 2023-03-06 VITALS — BP 118/78 | HR 99 | Ht 65.0 in | Wt 209.0 lb

## 2023-03-06 DIAGNOSIS — M6740 Ganglion, unspecified site: Secondary | ICD-10-CM | POA: Diagnosis not present

## 2023-03-06 DIAGNOSIS — M2550 Pain in unspecified joint: Secondary | ICD-10-CM | POA: Diagnosis not present

## 2023-03-06 DIAGNOSIS — M79604 Pain in right leg: Secondary | ICD-10-CM

## 2023-03-06 NOTE — Patient Instructions (Signed)
See you again in 2 months Ganglion Cyst drained

## 2023-03-06 NOTE — Progress Notes (Signed)
[name] [address] [place] [address] Phone: [phone] Subjective:   INadine Counts, am serving as a scribe for Dr. Antoine Primas.  I'm seeing this patient by the request  of:  Pincus Sanes, MD  CC: right calf pain   [id number]  [name] [name] is a 62 y.o. female coming in with complaint of R side calf pain. Lateral side that radiates down from knee to lower fibula. Has been getting worse over the last 2 week. Has tried tylenol and topicals, but only eases it for a short time.      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   Chronic constipation    Chronic fatigue 06/08/2016   Conjunctiva disorder 08/06/2018   Depression 09/03/2016   GAD (generalized anxiety disorder)    GERD    Herpes zoster without complication 12/11/2016   History of angioedema    ACE inhibitors   Hx of adenomatous colonic polyps 05/2014   Hyperlipidemia    Hypertension    Lactose intolerance    Migraine headache    Multiple sclerosis, relapsing-remitting (HCC) 11/03/2015   neurologist--- dr Wilnette Kales   Personal history of chemotherapy    PMB (postmenopausal bleeding)    Sleep apnea    Past Surgical History:  Procedure Laterality Date   BREAST BIOPSY Right 2016   x4   COLONOSCOPY  2018   DILATATION & CURETTAGE/HYSTEROSCOPY WITH MYOSURE N/A 09/29/2022   Procedure: DILATATION & CURETTAGE/HYSTEROSCOPY WITH MYOSURE;  Surgeon: Marlow Baars, MD;  Location: [facility];  Service: Gynecology;  Laterality: N/A;   MASTECTOMY Right 2016   PORT-A-CATH REMOVAL Left 05/13/2015   Procedure:  REMOVAL PORT-A-CATH;  Surgeon: Emelia Loron, MD;  Location: [facility] OR;  Service: General;  Laterality: Left;   PORTACATH PLACEMENT Left 11/06/2014   Procedure: INSERTION PORT-A-CATH;  Surgeon: Emelia Loron, MD;  Location: [facility];  Service: General;  Laterality: Left;   SIMPLE MASTECTOMY WITH AXILLARY SENTINEL NODE BIOPSY Right 05/13/2015   Procedure: RIGHT TOTAL  MASTECTOMY WITH RIGHT  AXILLARY SENTINEL NODE BIOPSY;  Surgeon: Emelia Loron, MD;  Location: [facility] OR;  Service: General;  Laterality: Right;   TUBAL LIGATION     yrs ago   Social History   Socioeconomic History   Marital status: Single    Spouse name: Not on file   Number of children: 2   Years of education: 12   Highest education level: Not on file  Occupational History   Occupation: Engineer, drilling: [employer]  Tobacco Use   Smoking status: Never   Smokeless tobacco: Never  Vaping Use   Vaping Use: Never used  Substance and Sexual Activity   Alcohol use: No    Alcohol/week: 0.0 standard drinks of alcohol   Drug use: Never   Sexual activity: Not on file  Other Topics Concern   Not on file  Social History Narrative   Patient is a Administrator, 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    Patient is single and lives alone.    Patient is right handed.    Social Determinants  of Health   Financial Resource Strain: Not on file  Food Insecurity: Not on file  Transportation Needs: Not on file  Physical Activity: Not on file  Stress: Not on file  Social Connections: Not on file   Allergies  Allergen Reactions   Ace Inhibitors Anaphylaxis     Angioedema (tongue swelling)   Clarithromycin Anaphylaxis    Throat swells   Contrave [Naltrexone-Bupropion Hcl Er] Shortness Of Breath    Chest and sob   Levaquin [Levofloxacin] Other (See Comments)    Tendon pain - shoulder and calf   Family History  Problem Relation Age of Onset   Coronary  artery disease Mother    Heart disease Mother    Diabetes Mother    Hypertension Mother    Stroke Mother    Alcohol abuse Father    COPD Maternal Grandmother    Hypertension Other    Hyperlipidemia Other    Colon cancer Neg Hx    Rectal cancer Neg Hx    Stomach cancer Neg Hx    Esophageal cancer Neg Hx    Breast cancer Neg Hx      Current Outpatient Medications (Cardiovascular):    hydrochlorothiazide (HYDRODIURIL) 25 MG tablet, TAKE 1 TABLET (25 MG TOTAL) BY MOUTH DAILY.   losartan (COZAAR) 50 MG tablet, Take 1 tablet (50 mg total) by mouth daily.   metoprolol tartrate (LOPRESSOR) 50 MG tablet, TAKE 1 TABLET BY MOUTH TWICE A DAY (Patient taking differently: Take 50 mg by mouth 2 (two) times daily.)  Current Outpatient Medications (Respiratory):    albuterol (VENTOLIN HFA) 108 (90 Base) MCG/ACT inhaler, Inhale 2 puffs into the lungs every 6 (six) hours as needed for wheezing or shortness of breath.   Azelastine HCl 137 MCG/SPRAY SOLN, PLACE 2 SPRAYS INTO BOTH NOSTRILS 2 (TWO) TIMES DAILY. USE IN EACH NOSTRIL AS DIRECTED (Patient taking differently: Place 2 sprays into the nose 2 (two) times daily.)   budesonide-formoterol (SYMBICORT) 160-4.5 MCG/ACT inhaler, Inhale 2 puffs into the lungs 2 (two) times daily.   fluticasone (FLONASE) 50 MCG/ACT nasal spray, SHAKE LIQUID AND USE 2 SPRAYS IN EACH NOSTRIL DAILY   ipratropium (ATROVENT) 0.06 % nasal spray, Place 2 sprays into both nostrils 4 (four) times daily as needed (nasal congestion/drainage).   loratadine (CLARITIN) 10 MG tablet, Take 10 mg by mouth daily as needed for itching, rhinitis or allergies.  Current Outpatient Medications (Analgesics):    ibuprofen (ADVIL) 800 MG tablet, Take 1 tablet (800 mg total) by mouth every 8 (eight) hours as needed.  Current Outpatient Medications (Hematological):    ferrous gluconate (FERGON) 324 MG tablet, Take 1 tablet (324 mg total) by mouth 2 (two) times daily with a meal. (Patient taking  differently: Take 324 mg by mouth 3 (three) times a week. PER PT TAKES MON/ WED/ FRIDAY TWICE DAILY)   XARELTO 20 MG TABS tablet, TAKE 1 TABLET(20 MG) BY MOUTH DAILY WITH SUPPER  Current Outpatient Medications (Other):    acetic acid-hydrocortisone (VOSOL-HC) OTIC solution, Place 3 drops into the right ear 2 (two) times daily.   ALPRAZolam (XANAX) 0.5 MG tablet, Take 1 tablet (0.5 mg total) by mouth 2 (two) times daily as needed for anxiety or sleep.   b complex vitamins tablet, Take 1 tablet by mouth daily.   Cholecalciferol (VITAMIN D3) 50 MCG (2000 UT) TABS, Take 1 tablet by mouth daily.   COPAXONE 40 MG/ML SOSY, INJECT ONE SYRINGE SUBCUTANEOUSLY THREE TIMES PER WEEK AT LEAST 48 HOURS APART. ALLOW  SYRINGE TO WARM TO ROOM TEMPERATURE FOR 20 MINUTES. REFRIGERATE. (Patient taking differently: Inject 40 mg as directed 3 (three) times a week. INJECT ONE SYRINGE SUBCUTANEOUSLY THREE TIMES PER WEEK AT LEAST 48 HOURS APART. ALLOW SYRINGE TO WARM TO ROOM TEMPERATURE FOR 20 MINUTES. REFRIGERATE.)   diclofenac Sodium (PENNSAID) 2 % SOLN, Apply 2 Pump (40 mg total) topically 2 (two) times daily as needed.   gabapentin (NEURONTIN) 300 MG capsule, Take 1 capsule (300 mg total) by mouth 2 (two) times daily.   LINZESS 145 MCG CAPS capsule, TAKE 1 CAPSULE BY MOUTH EVERY DAY BEFORE BREAKFAST (Patient taking differently: Take 145 mcg by mouth daily before breakfast.)   pantoprazole (PROTONIX) 40 MG tablet, TAKE 1 TABLET BY MOUTH EVERY DAY   potassium chloride (KLOR-CON M10) 10 MEQ tablet, TAKE 4 TABLETS BY MOUTH EVERY DAY   Semaglutide-Weight Management (WEGOVY) 0.25 MG/0.5ML SOAJ, Inject 0.25 mg into the skin once a week.   Tart Cherry 1200 MG CAPS, Take 1 capsule by mouth at bedtime.   TURMERIC PO, Take 1 capsule by mouth daily.   valACYclovir (VALTREX) 500 MG tablet, Take 500 mg by mouth 2 (two) times daily as needed.   Reviewed prior external information including notes and imaging from  primary care  provider As well as notes that were available from care everywhere and other healthcare systems.  Past medical history, social, surgical and family history all reviewed in electronic medical record.  No pertanent information unless stated regarding to the chief complaint.   Review of Systems:  No headache, visual changes, nausea, vomiting, diarrhea, constipation, dizziness, abdominal pain, skin rash, fevers, chills, night sweats, weight loss, swollen lymph nodes, body aches, joint swelling, chest pain, shortness of breath, mood changes. POSITIVE muscle aches  Objective  Blood pressure 118/78, pulse 99, height 5\' 5"  (1.651 m), weight 209 lb (94.8 kg), last menstrual period 11/01/2012, SpO2 96 %.   General: No apparent distress alert and oriented x3 mood and affect normal, dressed appropriately.  HEENT: Pupils equal, extraocular movements intact  Respiratory: Patient's speak in full sentences and does not appear short of breath  Cardiovascular: No lower extremity edema, non tender, no erythema  Antalgic gait noted.  Patient does have severe tenderness over the anterior tibialis no pain on calf musculature posteriorly.  No significant swelling of the lower extremity.  No worsening pain with resisted dorsiflexion of the foot  Limited muscular skeletal ultrasound was performed and interpreted by Antoine Primas, M  Limited ultrasound shows the patient does have a hypoechoic mass noted in the anterior tibialis muscle.  No abnormal vascularity.  Severely tender with compression. Impression: Likely ganglion cyst in the anterior tibialis tendon muscular junction  Procedure: Real-time Ultrasound Guided Injection of right anterior cyst aspiration Device: GE Logiq Q7 Ultrasound guided injection is preferred based studies that show increased duration, increased effect, greater accuracy, decreased procedural pain, increased response rate, and decreased cost with ultrasound guided versus blind injection.   Verbal informed consent obtained.  Time-out conducted.  Noted no overlying erythema, induration, or other signs of local infection.  Skin prepped in a sterile fashion.  Local anesthesia: Topical Ethyl chloride.  With sterile technique and under real time ultrasound guidance: With an 18-gauge 1-1/2 inch needle patient was injected with 0.5 cc 0.5% Marcaine and then removed 2 cc of gel like material with mild serosanguineous fluid.  On ultrasound it does appear that the sac great and went down the anterior tibialis area. Completed without difficulty  Pain immediately  resolved suggesting accurate placement of the medication.  Patient was able to walk better immediately. Advised to call if fevers/chills, erythema, induration, drainage, or persistent bleeding.  Impression: Technically successful ultrasound guided injection.    Impression and Recommendations:    The above documentation has been reviewed and is accurate and complete Judi Saa, DO

## 2023-03-06 NOTE — Assessment & Plan Note (Signed)
Aspiration today.  Discussed with patient about which activities to do and which ones to avoid.  Likely this is from a long time ago and just is accumulated slowly over the course of time.  I do not see any abnormal blood findings around the area.  If no significant benefit patient needs to seek medical attention or any type of findings that would be consistent with a potential infectious etiology.  Patient is being worked up for the possibility of recurrent cancer but did not have any pain in the posterior vascularity.  Patient does not completely resolved, even though it is at this moment to see [place] again sooner otherwise we will follow-up again in 2 months to make sure there is no reaccumulation

## 2023-03-07 LAB — SYNOVIAL FLUID ANALYSIS, COMPLETE
Basophils, %: 0 %
Eosinophils-Synovial: 0 % (ref 0–2)
Lymphocytes-Synovial Fld: 50 % (ref 0–74)
Monocyte/Macrophage: 30 % (ref 0–69)
Neutrophil, Synovial: 20 % (ref 0–24)
Synoviocytes, %: 0 % (ref 0–15)
WBC, Synovial: 486 cells/uL — ABNORMAL HIGH (ref <150)

## 2023-03-09 ENCOUNTER — Ambulatory Visit
Admission: RE | Admit: 2023-03-09 | Discharge: 2023-03-09 | Disposition: A | Discharge status: Home / Self Care | Payer: BC Managed Care – PPO | Source: Ambulatory Visit | Attending: Obstetrics | Admitting: Obstetrics

## 2023-03-09 ENCOUNTER — Encounter: Payer: Self-pay | Admitting: Family Medicine

## 2023-03-09 DIAGNOSIS — N6320 Unspecified lump in the left breast, unspecified quadrant: Secondary | ICD-10-CM

## 2023-03-09 DIAGNOSIS — R599 Enlarged lymph nodes, unspecified: Secondary | ICD-10-CM

## 2023-03-09 HISTORY — PX: BREAST BIOPSY: SHX20

## 2023-03-12 ENCOUNTER — Other Ambulatory Visit: Payer: Self-pay | Admitting: Family Medicine

## 2023-03-12 DIAGNOSIS — M79604 Pain in right leg: Secondary | ICD-10-CM

## 2023-03-13 ENCOUNTER — Ambulatory Visit: Payer: BC Managed Care – PPO | Admitting: Family Medicine

## 2023-03-13 NOTE — Addendum Note (Signed)
Addended by: Doristine Bosworth on: 03/13/2023 08:34 AM   Modules accepted: Orders

## 2023-03-16 ENCOUNTER — Telehealth: Payer: Self-pay | Admitting: Family Medicine

## 2023-03-16 MED ORDER — DIAZEPAM 5 MG PO TABS: ORAL_TABLET | ORAL | 0 refills | Status: DC

## 2023-03-16 NOTE — Telephone Encounter (Signed)
Done

## 2023-03-16 NOTE — Telephone Encounter (Signed)
Patient called stating that she is scheduled for her MRI on 7/12 and is on their wait list for the possibility of a cancellation. She asked if Dr Katrinka Blazing could send in something for her to help her relax during the MRI?  Please advise.  Pharmacy: [facility]

## 2023-03-19 ENCOUNTER — Telehealth: Payer: Self-pay | Admitting: Family Medicine

## 2023-03-19 ENCOUNTER — Ambulatory Visit (INDEPENDENT_AMBULATORY_CARE_PROVIDER_SITE_OTHER): Payer: BC Managed Care – PPO

## 2023-03-19 ENCOUNTER — Telehealth: Payer: Self-pay

## 2023-03-19 ENCOUNTER — Other Ambulatory Visit: Payer: Self-pay

## 2023-03-19 DIAGNOSIS — M79604 Pain in right leg: Secondary | ICD-10-CM

## 2023-03-19 MED ORDER — ALPRAZOLAM 0.5 MG PO TABS: 0.5000 mg | ORAL_TABLET | Freq: Two times a day (BID) | ORAL | 0 refills | Status: DC | PRN

## 2023-03-19 NOTE — Telephone Encounter (Signed)
Patient called asking if someone would be able to call her back in regards to the pain she is having in her right shin?  Please advise.

## 2023-03-19 NOTE — Telephone Encounter (Signed)
sent 

## 2023-03-19 NOTE — Telephone Encounter (Signed)
Xray ordered for patient. She will come by today to get image taken.

## 2023-04-04 NOTE — Progress Notes (Signed)
[name] [address] [place] [address] Phone: [phone] Subjective:   [name] [name], am serving as a scribe for Dr. Antoine Primas.  I'm seeing this patient by the request  of:  Pincus Sanes, MD  CC: right leg pain   ION:[id number]  03/06/2023 Aspiration today. Discussed with patient about which activities to do and which ones to avoid. Likely this is from a long time ago and just is accumulated slowly over the course of time. I do not see any abnormal blood findings around the area. If no significant benefit patient needs to seek medical attention or any type of findings that would be consistent with a potential infectious etiology. Patient is being worked up for the possibility of recurrent cancer but did not have any pain in the posterior vascularity. Patient does not completely resolved, even though it is at this moment to see [place] again sooner otherwise we will follow-up again in 2 months to make sure there is no reaccumulation   Updated 04/11/2023 [name] [name] is a 62 y.o. female coming in with complaint of pain in calf. Leg pain is intermittent and not as bad as it was last visit. Aspiration was helpful but then pain became sharp and is now not as intense.   Patient brings in an outside MRI that states that there is likely a ganglion cyst as well as some inflammation from previous aspiration.  The area of the anterior tibialis muscle.  Does not appear to have any type of abnormality noted except for possible medial tibial stress reaction but is not in need vicinity where patient is hurting.     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   Chronic constipation    Chronic fatigue 06/08/2016   Conjunctiva disorder 08/06/2018   Depression 09/03/2016   GAD (generalized anxiety disorder)    GERD    Herpes zoster without complication 12/11/2016   History of angioedema    ACE inhibitors   Hx of adenomatous colonic polyps 05/2014   Hyperlipidemia    Hypertension    Lactose intolerance    Migraine headache    Multiple sclerosis, relapsing-remitting (HCC) 11/03/2015   neurologist--- dr Wilnette Kales   Personal history of chemotherapy    PMB (postmenopausal bleeding)    Sleep apnea    Past Surgical History:  Procedure Laterality Date   BREAST BIOPSY Right 2016   x4   BREAST BIOPSY Left 03/09/2023   BREAST BIOPSY Left 03/09/2023   [place] LT BREAST BX W LOC DEV 1ST LESION IMG BX SPEC US GUIDE 03/09/2023 [facility]   COLONOSCOPY  2018   DILATATION & CURETTAGE/HYSTEROSCOPY WITH MYOSURE N/A 09/29/2022   Procedure: DILATATION & CURETTAGE/HYSTEROSCOPY WITH MYOSURE;  Surgeon: Marlow Baars, MD;  Location: [facility];  Service: Gynecology;  Laterality: N/A;   MASTECTOMY Right 2016   PORT-A-CATH REMOVAL Left 05/13/2015   Procedure: REMOVAL PORT-A-CATH;  Surgeon: Emelia Loron, MD;  Location: [facility] OR;  Service: General;  Laterality: Left;   PORTACATH PLACEMENT Left 11/06/2014   Procedure: INSERTION PORT-A-CATH;  Surgeon: Emelia Loron, MD;  Location: [facility];  Service: General;  Laterality:  Left;   SIMPLE MASTECTOMY WITH AXILLARY SENTINEL NODE BIOPSY Right 05/13/2015   Procedure: RIGHT TOTAL  MASTECTOMY WITH RIGHT  AXILLARY SENTINEL NODE BIOPSY;  Surgeon: Emelia Loron, MD;  Location: [facility] OR;  Service: General;  Laterality: Right;   TUBAL LIGATION     yrs ago   Social History   Socioeconomic History   Marital status: Single    Spouse name: Not on file   Number of children: 2   Years of education: 12   Highest education level: Not on file   Occupational History   Occupation: Engineer, drilling: [employer]  Tobacco Use   Smoking status: Never   Smokeless tobacco: Never  Vaping Use   Vaping status: Never Used  Substance and Sexual Activity   Alcohol use: No    Alcohol/week: 0.0 standard drinks of alcohol   Drug use: Never   Sexual activity: Not on file  Other Topics Concern   Not on file  Social History Narrative   Patient is a Administrator, 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    Patient is single and lives alone.    Patient is right handed.    Social Determinants of Health   Financial Resource Strain: Not on file  Food Insecurity: Not on file  Transportation Needs: Not on file  Physical Activity: Not on file  Stress: Not on file  Social Connections: Not on file   Allergies  Allergen Reactions   Ace Inhibitors Anaphylaxis     Angioedema (tongue swelling)   Clarithromycin Anaphylaxis    Throat swells   Contrave [Naltrexone-Bupropion Hcl Er] Shortness Of Breath    Chest and sob   Levaquin [Levofloxacin] Other (See Comments)    Tendon pain - shoulder and calf   Family History  Problem Relation Age of Onset   Coronary artery disease Mother    Heart disease Mother    Diabetes Mother    Hypertension Mother    Stroke Mother    Alcohol abuse Father    COPD Maternal Grandmother    Hypertension Other    Hyperlipidemia Other    Colon cancer Neg Hx    Rectal cancer Neg Hx    Stomach cancer Neg Hx    Esophageal cancer Neg Hx    Breast cancer Neg Hx      Current Outpatient Medications (Cardiovascular):    hydrochlorothiazide (HYDRODIURIL) 25 MG tablet, TAKE 1 TABLET (25 MG TOTAL) BY MOUTH DAILY.   losartan (COZAAR) 50 MG tablet, Take 1 tablet (50 mg total) by mouth daily.   metoprolol tartrate (LOPRESSOR) 50 MG tablet, TAKE 1 TABLET BY MOUTH TWICE A DAY (Patient taking differently: Take 50 mg by mouth 2 (two) times daily.)  Current Outpatient  Medications (Respiratory):    albuterol (VENTOLIN HFA) 108 (90 Base) MCG/ACT inhaler, Inhale 2 puffs into the lungs every 6 (six) hours as needed for wheezing or shortness of breath.   Azelastine HCl 137 MCG/SPRAY SOLN, PLACE 2 SPRAYS INTO BOTH NOSTRILS 2 (TWO) TIMES DAILY. USE IN EACH NOSTRIL AS DIRECTED (Patient taking differently: Place 2 sprays into the nose 2 (two) times daily.)   budesonide-formoterol (SYMBICORT) 160-4.5 MCG/ACT inhaler, Inhale 2 puffs into the lungs 2 (two) times daily.   fluticasone (FLONASE) 50 MCG/ACT nasal spray, SHAKE LIQUID AND USE 2 SPRAYS IN EACH NOSTRIL DAILY   ipratropium (ATROVENT) 0.06 % nasal spray, Place 2 sprays into both nostrils 4 (four) times daily as needed (nasal  congestion/drainage).   loratadine (CLARITIN) 10 MG tablet, Take 10 mg by mouth daily as needed for itching, rhinitis or allergies.  Current Outpatient Medications (Analgesics):    ibuprofen (ADVIL) 800 MG tablet, Take 1 tablet (800 mg total) by mouth every 8 (eight) hours as needed.  Current Outpatient Medications (Hematological):    ferrous gluconate (FERGON) 324 MG tablet, Take 1 tablet (324 mg total) by mouth 2 (two) times daily with a meal. (Patient taking differently: Take 324 mg by mouth 3 (three) times a week. PER PT TAKES MON/ WED/ FRIDAY TWICE DAILY)   XARELTO 20 MG TABS tablet, TAKE 1 TABLET(20 MG) BY MOUTH DAILY WITH SUPPER  Current Outpatient Medications (Other):    acetic acid-hydrocortisone (VOSOL-HC) OTIC solution, Place 3 drops into the right ear 2 (two) times daily.   ALPRAZolam (XANAX) 0.5 MG tablet, Take 1 tablet (0.5 mg total) by mouth 2 (two) times daily as needed for anxiety or sleep.   b complex vitamins tablet, Take 1 tablet by mouth daily.   Cholecalciferol (VITAMIN D3) 50 MCG (2000 UT) TABS, Take 1 tablet by mouth daily.   COPAXONE 40 MG/ML SOSY, INJECT ONE SYRINGE SUBCUTANEOUSLY THREE TIMES PER WEEK AT LEAST 48 HOURS APART. ALLOW SYRINGE TO WARM TO ROOM TEMPERATURE  FOR 20 MINUTES. REFRIGERATE. (Patient taking differently: Inject 40 mg as directed 3 (three) times a week. INJECT ONE SYRINGE SUBCUTANEOUSLY THREE TIMES PER WEEK AT LEAST 48 HOURS APART. ALLOW SYRINGE TO WARM TO ROOM TEMPERATURE FOR 20 MINUTES. REFRIGERATE.)   diazepam (VALIUM) 5 MG tablet, One tab by mouth, 2 hours before procedure.   diclofenac Sodium (PENNSAID) 2 % SOLN, Apply 2 Pump (40 mg total) topically 2 (two) times daily as needed.   gabapentin (NEURONTIN) 300 MG capsule, TAKE 1 CAPSULE BY MOUTH TWICE A DAY   LINZESS 145 MCG CAPS capsule, TAKE 1 CAPSULE BY MOUTH EVERY DAY BEFORE BREAKFAST (Patient taking differently: Take 145 mcg by mouth daily before breakfast.)   pantoprazole (PROTONIX) 40 MG tablet, TAKE 1 TABLET BY MOUTH EVERY DAY   potassium chloride (KLOR-CON M10) 10 MEQ tablet, TAKE 4 TABLETS BY MOUTH EVERY DAY   Tart Cherry 1200 MG CAPS, Take 1 capsule by mouth at bedtime.   TURMERIC PO, Take 1 capsule by mouth daily.   valACYclovir (VALTREX) 500 MG tablet, Take 500 mg by mouth 2 (two) times daily as needed.   Reviewed prior external information including notes and imaging from  primary care provider As well as notes that were available from care everywhere and other healthcare systems.  Past medical history, social, surgical and family history all reviewed in electronic medical record.  No pertanent information unless stated regarding to the chief complaint.   Review of Systems:  No headache, visual changes, nausea, vomiting, diarrhea, constipation, dizziness, abdominal pain, skin rash, fevers, chills, night sweats, weight loss, swollen lymph nodes, body aches, joint swelling, chest pain, shortness of breath, mood changes. POSITIVE muscle aches  Objective  Blood pressure 112/76, pulse 97, height 5\' 5"  (1.651 m), weight 208 lb (94.3 kg), last menstrual period 11/01/2012, SpO2 96%.   General: No apparent distress alert and oriented x3 mood and affect normal, dressed  appropriately.  HEENT: Pupils equal, extraocular movements intact  Respiratory: Patient's speak in full sentences and does not appear short of breath  Cardiovascular: No lower extremity edema, non tender, no erythema  Right leg does have some tenderness still noted at the origin of the anterior Patient does have tenderness to palpation in  that area but no significant symptoms noted at this time.  Patient is able to ambulate without any significant discomfort.  Limited muscular skeletal ultrasound was performed and interpreted by Antoine Primas, M  Limited ultrasound does show that the ganglion stenosis noted in the area.  Measuring approximately 1.5 cm in diameter.  No abnormal vascularity noted in the area.  No significant communication to the bone itself. Impression: Likely ganglion cyst.    Impression and Recommendations:    The above documentation has been reviewed and is accurate and complete Judi Saa, DO

## 2023-04-09 ENCOUNTER — Other Ambulatory Visit: Payer: Self-pay | Admitting: Family Medicine

## 2023-04-09 ENCOUNTER — Other Ambulatory Visit: Payer: Self-pay | Admitting: Internal Medicine

## 2023-04-11 ENCOUNTER — Encounter: Payer: Self-pay | Admitting: Family Medicine

## 2023-04-11 ENCOUNTER — Ambulatory Visit: Payer: BC Managed Care – PPO | Admitting: Family Medicine

## 2023-04-11 ENCOUNTER — Other Ambulatory Visit: Payer: Self-pay

## 2023-04-11 VITALS — BP 112/76 | HR 97 | Ht 65.0 in | Wt 208.0 lb

## 2023-04-11 DIAGNOSIS — M6740 Ganglion, unspecified site: Secondary | ICD-10-CM | POA: Diagnosis not present

## 2023-04-11 DIAGNOSIS — I70221 Atherosclerosis of native arteries of extremities with rest pain, right leg: Secondary | ICD-10-CM

## 2023-04-11 DIAGNOSIS — M79604 Pain in right leg: Secondary | ICD-10-CM | POA: Diagnosis not present

## 2023-04-11 NOTE — Assessment & Plan Note (Signed)
Held on any type of aspiration today.  Discussed with patient that there is a possibility that any worsening discomfort may need to continue to monitor.  Patient wants to hold on any surgical intervention.  Still awaiting to look at the images at this moment but did have the report showing that there is mostly a ganglion cyst with some soft tissue inflammation from the procedure but otherwise nothing specific.  Hopefully will be able to get the images to further evaluate.  Past medical history significant for breast cancer multiple years ago but no fevers, chills, any abnormal weight loss and findings did not suggest anything as concerning at this time, patient wants to try compression and monitor for the next 2 months.

## 2023-04-11 NOTE — Patient Instructions (Addendum)
Report shows ganglion cyst and you have 2 of them  You should do well though. Look at [facility] compression socks 15-20MMHg of mercury  or 2xu See me again in 2 months

## 2023-04-17 NOTE — Progress Notes (Unsigned)
[name] [name] [address] [place] [address] Phone: [phone] Subjective:   [name] [name], am serving as a scribe for Dr. Antoine Primas.  I'm seeing this patient by the request  of:  Pincus Sanes, MD  CC: Leg pain follow-up  [id number]  04/11/2023 Held on any type of aspiration today.  Discussed with patient that there is a possibility that any worsening discomfort may need to continue to monitor.  Patient wants to hold on any surgical intervention.  Still awaiting to look at the images at this moment but did have the report showing that there is mostly a ganglion cyst with some soft tissue inflammation from the procedure but otherwise nothing specific.  Hopefully will be able to get the images to further evaluate.  Past medical history significant for breast cancer multiple years ago but no fevers, chills, any abnormal weight loss and findings did not suggest anything as concerning at this time, patient wants to try compression and monitor for the next 2 months.     Update 04/18/2023 [name] [name] is a 62 y.o. female coming in with complaint of R leg pain. Patient states that she is wearing support socks again and feels like knee needs to be drained.  Patient states that it is not very uncomfortable.  Can affect daily activities and symptoms.  Night.  Did get an MRI showing that there was a ganglion cyst but no other significant findings at the moment.      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   Chronic constipation    Chronic fatigue 06/08/2016   Conjunctiva disorder 08/06/2018    Depression 09/03/2016   GAD (generalized anxiety disorder)    GERD    Herpes zoster without complication 12/11/2016   History of angioedema    ACE inhibitors   Hx of adenomatous colonic polyps 05/2014   Hyperlipidemia    Hypertension    Lactose intolerance    Migraine headache    Multiple sclerosis, relapsing-remitting (HCC) 11/03/2015   neurologist--- dr Wilnette Kales   Personal history of chemotherapy    PMB (postmenopausal bleeding)    Sleep apnea    Past Surgical History:  Procedure Laterality Date   BREAST BIOPSY Right 2016   x4   BREAST BIOPSY Left 03/09/2023   BREAST BIOPSY Left 03/09/2023   [place] LT BREAST BX W LOC DEV 1ST LESION IMG BX SPEC US GUIDE 03/09/2023 [facility]   COLONOSCOPY  2018   DILATATION & CURETTAGE/HYSTEROSCOPY WITH MYOSURE N/A 09/29/2022   Procedure: DILATATION & CURETTAGE/HYSTEROSCOPY WITH MYOSURE;  Surgeon: Marlow Baars, MD;  Location: [facility];  Service: Gynecology;  Laterality: N/A;   MASTECTOMY Right 2016   PORT-A-CATH REMOVAL Left 05/13/2015   Procedure: REMOVAL PORT-A-CATH;  Surgeon: Emelia Loron, MD;  Location: [facility] OR;  Service: General;  Laterality: Left;   PORTACATH PLACEMENT Left 11/06/2014   Procedure: INSERTION PORT-A-CATH;  Surgeon: Emelia Loron, MD;  Location: [facility];  Service: General;  Laterality: Left;   SIMPLE MASTECTOMY WITH AXILLARY SENTINEL NODE BIOPSY Right 05/13/2015   Procedure: RIGHT TOTAL  MASTECTOMY WITH RIGHT  AXILLARY SENTINEL NODE BIOPSY;  Surgeon: Emelia Loron, MD;  Location: [facility] OR;  Service: General;  Laterality: Right;   TUBAL LIGATION     yrs ago   Social History   Socioeconomic History   Marital status: Single    Spouse name: Not on file   Number of children: 2   Years of education: 12   Highest education level: Not on file  Occupational History   Occupation: Engineer, drilling: [employer]  Tobacco Use   Smoking status: Never   Smokeless  tobacco: Never  Vaping Use   Vaping status: Never Used  Substance and Sexual Activity   Alcohol use: No    Alcohol/week: 0.0 standard drinks of alcohol   Drug use: Never   Sexual activity: Not on file  Other Topics Concern   Not on file  Social History Narrative   Patient is a Administrator, 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    Patient is single and lives alone.    Patient is right handed.    Social Determinants of Health   Financial Resource Strain: Not on file  Food Insecurity: Not on file  Transportation Needs: Not on file  Physical Activity: Not on file  Stress: Not on file  Social Connections: Not on file   Allergies  Allergen Reactions   Ace Inhibitors Anaphylaxis     Angioedema (tongue swelling)   Clarithromycin Anaphylaxis    Throat swells   Contrave [Naltrexone-Bupropion Hcl Er] Shortness Of Breath    Chest and sob   Levaquin [Levofloxacin] Other (See Comments)    Tendon pain - shoulder and calf   Family History  Problem Relation Age of Onset   Coronary artery disease Mother    Heart disease Mother    Diabetes Mother    Hypertension Mother    Stroke Mother    Alcohol abuse Father    COPD Maternal Grandmother    Hypertension Other    Hyperlipidemia Other    Colon cancer Neg Hx    Rectal cancer Neg Hx    Stomach cancer Neg Hx    Esophageal cancer Neg Hx    Breast cancer Neg Hx      Current Outpatient Medications (Cardiovascular):    hydrochlorothiazide (HYDRODIURIL) 25 MG tablet, TAKE 1 TABLET (25 MG TOTAL) BY MOUTH DAILY.   losartan (COZAAR) 50 MG tablet, Take 1 tablet (50 mg total) by mouth daily.   metoprolol tartrate (LOPRESSOR) 50 MG tablet, TAKE 1 TABLET BY MOUTH TWICE A DAY (Patient taking differently: Take 50 mg by mouth 2 (two) times daily.)  Current Outpatient Medications (Respiratory):    albuterol (VENTOLIN HFA) 108 (90 Base) MCG/ACT inhaler, Inhale 2 puffs into the lungs every 6 (six) hours as  needed for wheezing or shortness of breath.   Azelastine HCl 137 MCG/SPRAY SOLN, PLACE 2 SPRAYS INTO BOTH NOSTRILS 2 (TWO) TIMES DAILY. USE IN EACH NOSTRIL AS DIRECTED (Patient taking differently: Place 2 sprays into the nose 2 (two) times daily.)   budesonide-formoterol (SYMBICORT) 160-4.5 MCG/ACT inhaler, Inhale 2 puffs into the lungs 2 (two) times daily.   fluticasone (FLONASE) 50 MCG/ACT nasal spray, SHAKE LIQUID AND USE 2 SPRAYS IN EACH NOSTRIL DAILY   ipratropium (ATROVENT) 0.06 % nasal spray, Place 2 sprays into both nostrils 4 (four) times daily as needed (nasal congestion/drainage).   loratadine (CLARITIN) 10 MG tablet, Take 10 mg by mouth daily as needed for itching, rhinitis or allergies.  Current Outpatient Medications (Analgesics):  ibuprofen (ADVIL) 800 MG tablet, Take 1 tablet (800 mg total) by mouth every 8 (eight) hours as needed.  Current Outpatient Medications (Hematological):    ferrous gluconate (FERGON) 324 MG tablet, Take 1 tablet (324 mg total) by mouth 2 (two) times daily with a meal. (Patient taking differently: Take 324 mg by mouth 3 (three) times a week. PER PT TAKES MON/ WED/ FRIDAY TWICE DAILY)   XARELTO 20 MG TABS tablet, TAKE 1 TABLET(20 MG) BY MOUTH DAILY WITH SUPPER  Current Outpatient Medications (Other):    ALPRAZolam (XANAX) 0.5 MG tablet, Take 1 tablet (0.5 mg total) by mouth 2 (two) times daily as needed for anxiety or sleep.   b complex vitamins tablet, Take 1 tablet by mouth daily.   Cholecalciferol (VITAMIN D3) 50 MCG (2000 UT) TABS, Take 1 tablet by mouth daily.   gabapentin (NEURONTIN) 300 MG capsule, TAKE 1 CAPSULE BY MOUTH TWICE A DAY   LINZESS 145 MCG CAPS capsule, TAKE 1 CAPSULE BY MOUTH EVERY DAY BEFORE BREAKFAST (Patient taking differently: Take 145 mcg by mouth daily before breakfast.)   pantoprazole (PROTONIX) 40 MG tablet, TAKE 1 TABLET BY MOUTH EVERY DAY   potassium chloride (KLOR-CON M10) 10 MEQ tablet, TAKE 4 TABLETS BY MOUTH EVERY DAY    Tart Cherry 1200 MG CAPS, Take 1 capsule by mouth at bedtime.   TURMERIC PO, Take 1 capsule by mouth daily.   valACYclovir (VALTREX) 500 MG tablet, Take 500 mg by mouth 2 (two) times daily as needed.   acetic acid-hydrocortisone (VOSOL-HC) OTIC solution, Place 3 drops into the right ear 2 (two) times daily.   COPAXONE 40 MG/ML SOSY, INJECT ONE SYRINGE SUBCUTANEOUSLY THREE TIMES PER WEEK AT LEAST 48 HOURS APART. ALLOW SYRINGE TO WARM TO ROOM TEMPERATURE FOR 20 MINUTES. REFRIGERATE. (Patient taking differently: Inject 40 mg as directed 3 (three) times a week. INJECT ONE SYRINGE SUBCUTANEOUSLY THREE TIMES PER WEEK AT LEAST 48 HOURS APART. ALLOW SYRINGE TO WARM TO ROOM TEMPERATURE FOR 20 MINUTES. REFRIGERATE.)   diazepam (VALIUM) 5 MG tablet, One tab by mouth, 2 hours before procedure.   diclofenac Sodium (PENNSAID) 2 % SOLN, Apply 2 Pump (40 mg total) topically 2 (two) times daily as needed.   Reviewed prior external information including notes and imaging from  primary care provider As well as notes that were available from care everywhere and other healthcare systems.  Past medical history, social, surgical and family history all reviewed in electronic medical record.  No pertanent information unless stated regarding to the chief complaint.   Review of Systems:  No headache, visual changes, nausea, vomiting, diarrhea, constipation, dizziness, abdominal pain, skin rash, fevers, chills, night sweats, weight loss, swollen lymph nodes, body aches, joint swelling, chest pain, shortness of breath, mood changes. POSITIVE muscle aches  Objective  Blood pressure 128/84, pulse 76, height 5\' 5"  (1.651 m), weight 208 lb (94.3 kg), last menstrual period 11/01/2012, SpO2 96%.   General: No apparent distress alert and oriented x3 mood and affect normal, dressed appropriately.  HEENT: Pupils equal, extraocular movements intact  Respiratory: Patient's speak in full sentences and does not appear short of breath   Cardiovascular: No lower extremity edema, non tender, no erythema  Patient is very Still has some fullness noted of the anterior tibialis area proximal to the knee.  Tender to palpation in this area.  Procedure: Real-time Ultrasound Guided Injection of right ganglion cyst of the anterior tibialis area Device: GE Logiq Q7 Ultrasound guided injection is preferred based studies  that show increased duration, increased effect, greater accuracy, decreased procedural pain, increased response rate, and decreased cost with ultrasound guided versus blind injection.  Verbal informed consent obtained.  Time-out conducted.  Noted no overlying erythema, induration, or other signs of local infection.  Skin prepped in a sterile fashion.  Local anesthesia: Topical Ethyl chloride.  With sterile technique and under real time ultrasound guidance: With a 18-gauge 1-1/2 inch needle injected with 1 cc of 0.5% Marcaine and aspirated with some gel-like material approximately 1 cc with blood tinge.  Then injected with 1 cc of Kenalog 40 mg/mL Completed without difficulty  Pain immediately resolved suggesting accurate placement of the medication.  Advised to call if fevers/chills, erythema, induration, drainage, or persistent bleeding.  Impression: Technically successful ultrasound guided injection.    Impression and Recommendations:     The above documentation has been reviewed and is accurate and complete Judi Saa, DO

## 2023-04-18 ENCOUNTER — Encounter: Payer: Self-pay | Admitting: Family Medicine

## 2023-04-18 ENCOUNTER — Other Ambulatory Visit: Payer: Self-pay

## 2023-04-18 ENCOUNTER — Ambulatory Visit (INDEPENDENT_AMBULATORY_CARE_PROVIDER_SITE_OTHER): Payer: BC Managed Care – PPO | Admitting: Family Medicine

## 2023-04-18 VITALS — BP 128/84 | HR 76 | Ht 65.0 in | Wt 208.0 lb

## 2023-04-18 DIAGNOSIS — M6740 Ganglion, unspecified site: Secondary | ICD-10-CM

## 2023-04-18 DIAGNOSIS — M25561 Pain in right knee: Secondary | ICD-10-CM

## 2023-04-18 NOTE — Patient Instructions (Signed)
If any signs of infection occur such as redness, swelling, heat, or pain, please seek medical attention or go into emergency room for further evaluation  See me as scheduled

## 2023-04-18 NOTE — Assessment & Plan Note (Signed)
Patient did have reaccumulation quickly.  Did have aspiration again today.  Hopefully this will be more beneficial.  If worsening pain or reoccurrence again would need to consider the possibility of surgical intervention.  Patient does on the medial aspect, mild stress reaction noted on the MRI but nothing severe.  Increase activity slowly otherwise.  Discussed proper shoes and compression with activity.  Follow-up with me again in 6 weeks

## 2023-04-20 ENCOUNTER — Encounter: Payer: Self-pay | Admitting: Family Medicine

## 2023-04-25 ENCOUNTER — Ambulatory Visit: Payer: BC Managed Care – PPO | Admitting: Family Medicine

## 2023-05-20 ENCOUNTER — Encounter: Payer: Self-pay | Admitting: Internal Medicine

## 2023-05-25 ENCOUNTER — Ambulatory Visit: Payer: BC Managed Care – PPO | Admitting: Internal Medicine

## 2023-05-25 ENCOUNTER — Encounter: Payer: Self-pay | Admitting: Internal Medicine

## 2023-05-25 VITALS — BP 130/74 | HR 97 | Temp 98.6°F | Ht 65.0 in | Wt 207.0 lb

## 2023-05-25 DIAGNOSIS — R0789 Other chest pain: Secondary | ICD-10-CM | POA: Diagnosis not present

## 2023-05-25 DIAGNOSIS — F419 Anxiety disorder, unspecified: Secondary | ICD-10-CM | POA: Diagnosis not present

## 2023-05-25 MED ORDER — SERTRALINE HCL 50 MG PO TABS: 50.0000 mg | ORAL_TABLET | Freq: Every day | ORAL | 3 refills | Status: DC

## 2023-05-25 NOTE — Assessment & Plan Note (Addendum)
Female Left-sided chest pain started today Has had the pain for 7 hours-has improved and resolved by the end of the visit Unlikely to be cardiac in nature EKG today: Sinus tachycardia at 113 bpm, nonspecific T wave abnormality.  Tachycardia is new compared to previous EKG.  T wave abnormality has been seen on prior EKGs.  Possibly stress/anxiety related

## 2023-05-25 NOTE — Patient Instructions (Addendum)
     An EKG was done today.     Blood work was ordered.   The lab is on the first floor.    Medications changes include :   sertraline 50 mg daily     Return if symptoms worsen or fail to improve.

## 2023-05-25 NOTE — Assessment & Plan Note (Addendum)
Experiencing increased anxiety Partially related to her sister's recent death Some of her symptoms she is complaining about today possibly related to anxiety EKG, lab work today to rule out other causes Check CBC, CMP, TSH Can use alprazolam 0.5 mg twice daily as needed for anxiety Discussed daily anxiety medication-start sertraline 50 mg daily.  Discussed possible side effects Follow-up in 4 weeks

## 2023-05-25 NOTE — Progress Notes (Signed)
Subjective:    Patient ID: [name] [name], female    DOB: [birth year], 62 y.o.   MRN: [id number]      HPI [name] [name] is here for  Chief Complaint  Patient presents with   Dizziness    Sister died 05/08/23 - metastatic colon ca.  She was diagnosed 3 and half weeks prior to her death.  Left sided chest pain - started today.  No pain with palpation.  Nothing makes it better/worse.  Has had pain for 7 hours - getting better.  The pain is not reproducible   7 days ago she was riding in the car and had funny feeling come over - she got hot and felt tired feeling in body.  Her hands got clammy.  Lasted a couple of seconds. No cp, sob, palps, HA, lightheadedness.   Later that day she did not feel tired but her body felt tired.    She drinks water all day long so was not dehydrated.  She had eaten that morning.   Had another episode this morning - similar but much less severe.    She has taken her alprazolam on occasion and that has helped.  Medications and allergies reviewed with patient and updated if appropriate.  Current Outpatient Medications on File Prior to Visit  Medication Sig Dispense Refill   albuterol (VENTOLIN HFA) 108 (90 Base) MCG/ACT inhaler Inhale 2 puffs into the lungs every 6 (six) hours as needed for wheezing or shortness of breath. 8 g 5   ALPRAZolam (XANAX) 0.5 MG tablet Take 1 tablet (0.5 mg total) by mouth 2 (two) times daily as needed for anxiety or sleep. 30 tablet 0   Azelastine HCl 137 MCG/SPRAY SOLN PLACE 2 SPRAYS INTO BOTH NOSTRILS 2 (TWO) TIMES DAILY. USE IN EACH NOSTRIL AS DIRECTED (Patient taking differently: Place 2 sprays into the nose 2 (two) times daily.) 30 mL 2   b complex vitamins tablet Take 1 tablet by mouth daily.     budesonide-formoterol (SYMBICORT) 160-4.5 MCG/ACT inhaler Inhale 2 puffs into the lungs 2 (two) times daily. 10.2 g 5   Cholecalciferol (VITAMIN D3) 50 MCG (2000 UT) TABS Take 1 tablet by mouth daily.     ferrous gluconate  (FERGON) 324 MG tablet Take 1 tablet (324 mg total) by mouth 2 (two) times daily with a meal. (Patient taking differently: Take 324 mg by mouth 3 (three) times a week. PER PT TAKES MON/ WED/ FRIDAY TWICE DAILY) 180 tablet 3   fluticasone (FLONASE) 50 MCG/ACT nasal spray SHAKE LIQUID AND USE 2 SPRAYS IN EACH NOSTRIL DAILY 48 mL 2   gabapentin (NEURONTIN) 300 MG capsule TAKE 1 CAPSULE BY MOUTH TWICE A DAY 180 capsule 0   hydrochlorothiazide (HYDRODIURIL) 25 MG tablet TAKE 1 TABLET (25 MG TOTAL) BY MOUTH DAILY. 90 tablet 1   ibuprofen (ADVIL) 800 MG tablet Take 1 tablet (800 mg total) by mouth every 8 (eight) hours as needed. 60 tablet 1   ipratropium (ATROVENT) 0.06 % nasal spray Place 2 sprays into both nostrils 4 (four) times daily as needed (nasal congestion/drainage). 15 mL 5   LINZESS 145 MCG CAPS capsule TAKE 1 CAPSULE BY MOUTH EVERY DAY BEFORE BREAKFAST (Patient taking differently: Take 145 mcg by mouth daily before breakfast.) 90 capsule 1   loratadine (CLARITIN) 10 MG tablet Take 10 mg by mouth daily as needed for itching, rhinitis or allergies.     losartan (COZAAR) 50 MG tablet Take 1 tablet (50 mg total) by mouth  daily. 90 tablet 3   metoprolol tartrate (LOPRESSOR) 50 MG tablet TAKE 1 TABLET BY MOUTH TWICE A DAY (Patient taking differently: Take 50 mg by mouth 2 (two) times daily.) 180 tablet 2   pantoprazole (PROTONIX) 40 MG tablet TAKE 1 TABLET BY MOUTH EVERY DAY 90 tablet 1   potassium chloride (KLOR-CON M10) 10 MEQ tablet TAKE 4 TABLETS BY MOUTH EVERY DAY 360 tablet 1   Tart Cherry 1200 MG CAPS Take 1 capsule by mouth at bedtime.     TURMERIC PO Take 1 capsule by mouth daily.     valACYclovir (VALTREX) 500 MG tablet Take 500 mg by mouth 2 (two) times daily as needed.     XARELTO 20 MG TABS tablet TAKE 1 TABLET(20 MG) BY MOUTH DAILY WITH SUPPER 90 tablet 1   No current facility-administered medications on file prior to visit.    Review of Systems  Constitutional:  Negative for  chills, diaphoresis and fever.  Respiratory:  Positive for shortness of breath (yesterday and today with activity - albuterol helped). Negative for cough and wheezing.   Cardiovascular:  Positive for chest pain (left sided - today). Negative for palpitations and leg swelling.  Gastrointestinal:  Positive for constipation.  Neurological:  Positive for headaches (mild - from allergies). Negative for dizziness and light-headedness.       Objective:   Vitals:   05/25/23 1543  BP: 130/74  Pulse: 97  Temp: 98.6 F (37 C)  SpO2: 98%   BP Readings from Last 3 Encounters:  05/25/23 130/74  04/18/23 128/84  04/11/23 112/76   Wt Readings from Last 3 Encounters:  05/25/23 207 lb (93.9 kg)  04/18/23 208 lb (94.3 kg)  04/11/23 208 lb (94.3 kg)   Body mass index is 34.45 kg/m.    Physical Exam Constitutional:      General: She is not in acute distress.    Appearance: Normal appearance.  HENT:     Head: Normocephalic and atraumatic.  Eyes:     Conjunctiva/sclera: Conjunctivae normal.  Cardiovascular:     Rate and Rhythm: Normal rate and regular rhythm.     Heart sounds: Normal heart sounds.  Pulmonary:     Effort: Pulmonary effort is normal. No respiratory distress.     Breath sounds: Normal breath sounds. No wheezing.  Musculoskeletal:     Cervical back: Neck supple.     Right lower leg: No edema.     Left lower leg: No edema.  Lymphadenopathy:     Cervical: No cervical adenopathy.  Skin:    General: Skin is warm and dry.     Findings: No rash.  Neurological:     Mental Status: She is alert. Mental status is at baseline.  Psychiatric:        Mood and Affect: Mood normal.        Behavior: Behavior normal.            Assessment & Plan:    See Problem List for Assessment and Plan of chronic medical problems.

## 2023-05-28 LAB — COMPREHENSIVE METABOLIC PANEL
AG Ratio: 1.6 (calc) (ref 1.0–2.5)
ALT: 19 U/L (ref 6–29)
AST: 17 U/L (ref 10–35)
Albumin: 4.7 g/dL (ref 3.6–5.1)
Alkaline phosphatase (APISO): 76 U/L (ref 37–153)
BUN: 14 mg/dL (ref 7–25)
CO2: 26 mmol/L (ref 20–32)
Calcium: 10.1 mg/dL (ref 8.6–10.4)
Chloride: 106 mmol/L (ref 98–110)
Creat: 0.9 mg/dL (ref 0.50–1.05)
Globulin: 3 g/dL (ref 1.9–3.7)
Glucose, Bld: 99 mg/dL (ref 65–99)
Potassium: 4 mmol/L (ref 3.5–5.3)
Sodium: 143 mmol/L (ref 135–146)
Total Bilirubin: 0.6 mg/dL (ref 0.2–1.2)
Total Protein: 7.7 g/dL (ref 6.1–8.1)

## 2023-05-28 LAB — CBC WITH DIFFERENTIAL/PLATELET

## 2023-05-28 LAB — TSH: TSH: 2.64 m[IU]/L (ref 0.40–4.50)

## 2023-05-29 ENCOUNTER — Ambulatory Visit: Payer: BC Managed Care – PPO | Admitting: Internal Medicine

## 2023-05-29 ENCOUNTER — Other Ambulatory Visit: Payer: Self-pay | Admitting: Pharmacist

## 2023-06-02 ENCOUNTER — Other Ambulatory Visit: Payer: Self-pay | Admitting: Internal Medicine

## 2023-06-02 ENCOUNTER — Other Ambulatory Visit: Payer: Self-pay | Admitting: Family Medicine

## 2023-06-11 NOTE — Progress Notes (Deleted)
[name] [address] [place] [address] Phone: [phone] Subjective:    I'm seeing this patient by the request  of:  Pincus Sanes, MD  CC:   [id number]  04/18/2023 Patient did have reaccumulation quickly.  Did have aspiration again today.  Hopefully this will be more beneficial.  If worsening pain or reoccurrence again would need to consider the possibility of surgical intervention.  Patient does on the medial aspect, mild stress reaction noted on the MRI but nothing severe.  Increase activity slowly otherwise.  Discussed proper shoes and compression with activity.  Follow-up with me again in 6 weeks     Updated 06/12/2023 [name] [name] is a 62 y.o. 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   Chronic constipation    Chronic fatigue 06/08/2016   Conjunctiva disorder 08/06/2018   Depression 09/03/2016   GAD (generalized anxiety disorder)    GERD    Herpes zoster without complication 12/11/2016   History of angioedema    ACE inhibitors   Hx of adenomatous colonic polyps 05/2014   Hyperlipidemia    Hypertension    Lactose intolerance    Migraine headache    Multiple sclerosis, relapsing-remitting (HCC) 11/03/2015   neurologist--- dr Wilnette Kales   Personal history of chemotherapy    PMB (postmenopausal bleeding)    Sleep apnea    Past Surgical History:  Procedure Laterality Date   BREAST BIOPSY Right 2016   x4   BREAST BIOPSY Left 03/09/2023   BREAST BIOPSY Left 03/09/2023   [place] LT BREAST BX W LOC DEV 1ST LESION IMG BX SPEC  US GUIDE 03/09/2023 [facility]   COLONOSCOPY  2018   DILATATION & CURETTAGE/HYSTEROSCOPY WITH MYOSURE N/A 09/29/2022   Procedure: DILATATION & CURETTAGE/HYSTEROSCOPY WITH MYOSURE;  Surgeon: Marlow Baars, MD;  Location: [facility];  Service: Gynecology;  Laterality: N/A;   MASTECTOMY Right 2016   PORT-A-CATH REMOVAL Left 05/13/2015   Procedure: REMOVAL PORT-A-CATH;  Surgeon: Emelia Loron, MD;  Location: [facility] OR;  Service: General;  Laterality: Left;   PORTACATH PLACEMENT Left 11/06/2014   Procedure: INSERTION PORT-A-CATH;  Surgeon: Emelia Loron, MD;  Location: [facility];  Service: General;  Laterality: Left;   SIMPLE MASTECTOMY WITH AXILLARY SENTINEL NODE BIOPSY Right 05/13/2015   Procedure: RIGHT TOTAL  MASTECTOMY WITH RIGHT  AXILLARY SENTINEL NODE BIOPSY;  Surgeon: Emelia Loron, MD;  Location: [facility] OR;  Service: General;  Laterality: Right;   TUBAL LIGATION     yrs ago   Social History   Socioeconomic History   Marital status: Single    Spouse name: Not on file   Number of children: 2   Years of education: 12   Highest education level: Not on file  Occupational History   Occupation: Engineer, drilling: [employer]  Tobacco Use   Smoking status: Never   Smokeless tobacco: Never  Vaping Use   Vaping status: Never Used  Substance and Sexual Activity   Alcohol use: No    Alcohol/week: 0.0 standard drinks of alcohol  Drug use: Never   Sexual activity: Not on file  Other Topics Concern   Not on file  Social History Narrative   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    Patient has a Barista.    Patient is single and lives alone.    Patient is right handed.    Social Determinants of Health   Financial Resource Strain: Not on file  Food Insecurity: Not on file  Transportation Needs: Not on file  Physical Activity: Not on file  Stress: Not on file  Social Connections: Not on  file   Allergies  Allergen Reactions   Ace Inhibitors Anaphylaxis     Angioedema (tongue swelling)   Clarithromycin Anaphylaxis    Throat swells   Contrave [Naltrexone-Bupropion Hcl Er] Shortness Of Breath    Chest and sob   Levaquin [Levofloxacin] Other (See Comments)    Tendon pain - shoulder and calf   Family History  Problem Relation Age of Onset   Coronary artery disease Mother    Heart disease Mother    Diabetes Mother    Hypertension Mother    Stroke Mother    Alcohol abuse Father    Colon cancer Sister    COPD Maternal Grandmother    Hypertension Other    Hyperlipidemia Other    Rectal cancer Neg Hx    Stomach cancer Neg Hx    Esophageal cancer Neg Hx    Breast cancer Neg Hx      Current Outpatient Medications (Cardiovascular):    hydrochlorothiazide (HYDRODIURIL) 25 MG tablet, TAKE 1 TABLET (25 MG TOTAL) BY MOUTH DAILY.   losartan (COZAAR) 50 MG tablet, Take 1 tablet (50 mg total) by mouth daily.   metoprolol tartrate (LOPRESSOR) 50 MG tablet, TAKE 1 TABLET BY MOUTH TWICE A DAY  Current Outpatient Medications (Respiratory):    albuterol (VENTOLIN HFA) 108 (90 Base) MCG/ACT inhaler, Inhale 2 puffs into the lungs every 6 (six) hours as needed for wheezing or shortness of breath.   Azelastine HCl 137 MCG/SPRAY SOLN, PLACE 2 SPRAYS INTO BOTH NOSTRILS 2 (TWO) TIMES DAILY. USE IN EACH NOSTRIL AS DIRECTED (Patient taking differently: Place 2 sprays into the nose 2 (two) times daily.)   budesonide-formoterol (SYMBICORT) 160-4.5 MCG/ACT inhaler, Inhale 2 puffs into the lungs 2 (two) times daily.   fluticasone (FLONASE) 50 MCG/ACT nasal spray, SHAKE LIQUID AND USE 2 SPRAYS IN EACH NOSTRIL DAILY   ipratropium (ATROVENT) 0.06 % nasal spray, Place 2 sprays into both nostrils 4 (four) times daily as needed (nasal congestion/drainage).   loratadine (CLARITIN) 10 MG tablet, Take 10 mg by mouth daily as needed for itching, rhinitis or allergies.  Current Outpatient Medications  (Analgesics):    ibuprofen (ADVIL) 800 MG tablet, Take 1 tablet (800 mg total) by mouth every 8 (eight) hours as needed.  Current Outpatient Medications (Hematological):    ferrous gluconate (FERGON) 324 MG tablet, Take 1 tablet (324 mg total) by mouth 2 (two) times daily with a meal. (Patient taking differently: Take 324 mg by mouth 3 (three) times a week. PER PT TAKES MON/ WED/ FRIDAY TWICE DAILY)   XARELTO 20 MG TABS tablet, TAKE 1 TABLET(20 MG) BY MOUTH DAILY WITH SUPPER  Current Outpatient Medications (Other):    ALPRAZolam (XANAX) 0.5 MG tablet, Take 1 tablet (0.5 mg total) by mouth 2 (two) times daily as needed for anxiety or sleep.   b complex vitamins tablet, Take 1 tablet by mouth daily.   Cholecalciferol (VITAMIN D3)  50 MCG (2000 UT) TABS, Take 1 tablet by mouth daily.   gabapentin (NEURONTIN) 300 MG capsule, TAKE 1 CAPSULE BY MOUTH TWICE A DAY   linaclotide (LINZESS) 145 MCG CAPS capsule, TAKE 1 CAPSULE BY MOUTH EVERY DAY BEFORE BREAKFAST   pantoprazole (PROTONIX) 40 MG tablet, TAKE 1 TABLET BY MOUTH EVERY DAY   potassium chloride (KLOR-CON M10) 10 MEQ tablet, TAKE 4 TABLETS BY MOUTH EVERY DAY   sertraline (ZOLOFT) 50 MG tablet, Take 1 tablet (50 mg total) by mouth daily.   Tart Cherry 1200 MG CAPS, Take 1 capsule by mouth at bedtime.   TURMERIC PO, Take 1 capsule by mouth daily.   valACYclovir (VALTREX) 500 MG tablet, Take 500 mg by mouth 2 (two) times daily as needed.   Reviewed prior external information including notes and imaging from  primary care provider As well as notes that were available from care everywhere and other healthcare systems.  Past medical history, social, surgical and family history all reviewed in electronic medical record.  No pertanent information unless stated regarding to the chief complaint.   Review of Systems:  No headache, visual changes, nausea, vomiting, diarrhea, constipation, dizziness, abdominal pain, skin rash, fevers, chills, night  sweats, weight loss, swollen lymph nodes, body aches, joint swelling, chest pain, shortness of breath, mood changes. POSITIVE muscle aches  Objective  Last menstrual period 11/01/2012.   General: No apparent distress alert and oriented x3 mood and affect normal, dressed appropriately.  HEENT: Pupils equal, extraocular movements intact  Respiratory: Patient's speak in full sentences and does not appear short of breath  Cardiovascular: No lower extremity edema, non tender, no erythema      Impression and Recommendations:

## 2023-06-12 ENCOUNTER — Ambulatory Visit: Payer: BC Managed Care – PPO | Admitting: Family Medicine

## 2023-06-13 ENCOUNTER — Ambulatory Visit: Payer: BC Managed Care – PPO | Admitting: Adult Health

## 2023-06-16 ENCOUNTER — Other Ambulatory Visit: Payer: Self-pay | Admitting: Internal Medicine

## 2023-07-05 ENCOUNTER — Encounter: Payer: Self-pay | Admitting: Internal Medicine

## 2023-07-05 NOTE — Progress Notes (Signed)
Subjective:    Patient ID: [name] [name], female    DOB: Dec 09, 1960, 62 y.o.   MRN: [id number]      HPI [name] [name] is here for  Chief Complaint  Patient presents with   Numbness     Numbness in hands - at night when she lays down - her hands nad arms feel liek they are asleep.  She know it is her MS.  She needs her steroids.  She will f/u with her neurlogist but had to cancel an appt due to work.   Sinus pressure - thinks she has another sinus infection.     Feels anxious - did not tolerate sertraline - made her feel dull   Medications and allergies reviewed with patient and updated if appropriate.  Current Outpatient Medications on File Prior to Visit  Medication Sig Dispense Refill   albuterol (VENTOLIN HFA) 108 (90 Base) MCG/ACT inhaler Inhale 2 puffs into the lungs every 6 (six) hours as needed for wheezing or shortness of breath. 8 g 5   ALPRAZolam (XANAX) 0.5 MG tablet Take 1 tablet (0.5 mg total) by mouth 2 (two) times daily as needed for anxiety or sleep. 30 tablet 0   Azelastine HCl 137 MCG/SPRAY SOLN PLACE 2 SPRAYS INTO BOTH NOSTRILS 2 (TWO) TIMES DAILY. USE IN EACH NOSTRIL AS DIRECTED (Patient taking differently: Place 2 sprays into the nose 2 (two) times daily.) 30 mL 2   b complex vitamins tablet Take 1 tablet by mouth daily.     budesonide-formoterol (SYMBICORT) 160-4.5 MCG/ACT inhaler Inhale 2 puffs into the lungs 2 (two) times daily. 10.2 g 5   Cholecalciferol (VITAMIN D3) 50 MCG (2000 UT) TABS Take 1 tablet by mouth daily.     ferrous gluconate (FERGON) 324 MG tablet Take 1 tablet (324 mg total) by mouth 2 (two) times daily with a meal. (Patient taking differently: Take 324 mg by mouth 3 (three) times a week. PER PT TAKES MON/ WED/ FRIDAY TWICE DAILY) 180 tablet 3   fluticasone (FLONASE) 50 MCG/ACT nasal spray SHAKE LIQUID AND USE 2 SPRAYS IN EACH NOSTRIL DAILY 48 mL 2   gabapentin (NEURONTIN) 300 MG capsule TAKE 1 CAPSULE BY MOUTH TWICE A DAY 180 capsule 0    hydrochlorothiazide (HYDRODIURIL) 25 MG tablet TAKE 1 TABLET (25 MG TOTAL) BY MOUTH DAILY. 90 tablet 1   ibuprofen (ADVIL) 800 MG tablet Take 1 tablet (800 mg total) by mouth every 8 (eight) hours as needed. 60 tablet 1   ipratropium (ATROVENT) 0.06 % nasal spray Place 2 sprays into both nostrils 4 (four) times daily as needed (nasal congestion/drainage). 15 mL 5   linaclotide (LINZESS) 145 MCG CAPS capsule TAKE 1 CAPSULE BY MOUTH EVERY DAY BEFORE BREAKFAST 90 capsule 1   loratadine (CLARITIN) 10 MG tablet Take 10 mg by mouth daily as needed for itching, rhinitis or allergies.     losartan (COZAAR) 50 MG tablet Take 1 tablet (50 mg total) by mouth daily. 90 tablet 3   metoprolol tartrate (LOPRESSOR) 50 MG tablet TAKE 1 TABLET BY MOUTH TWICE A DAY 180 tablet 2   pantoprazole (PROTONIX) 40 MG tablet TAKE 1 TABLET BY MOUTH EVERY DAY 90 tablet 1   potassium chloride (KLOR-CON M10) 10 MEQ tablet TAKE 4 TABLETS BY MOUTH EVERY DAY 360 tablet 1   Tart Cherry 1200 MG CAPS Take 1 capsule by mouth at bedtime.     TURMERIC PO Take 1 capsule by mouth daily.     valACYclovir (VALTREX)  500 MG tablet Take 500 mg by mouth 2 (two) times daily as needed.     XARELTO 20 MG TABS tablet TAKE 1 TABLET(20 MG) BY MOUTH DAILY WITH SUPPER 90 tablet 1   No current facility-administered medications on file prior to visit.    Review of Systems  HENT:  Positive for sinus pressure.   Neurological:  Positive for numbness and headaches.  Psychiatric/Behavioral:  The patient is nervous/anxious.        Objective:   Vitals:   07/06/23 1009  BP: 130/80  Pulse: 65  Temp: 98 F (36.7 C)  SpO2: 98%   BP Readings from Last 3 Encounters:  07/06/23 130/80  05/25/23 130/74  04/18/23 128/84   Wt Readings from Last 3 Encounters:  07/06/23 210 lb (95.3 kg)  05/25/23 207 lb (93.9 kg)  04/18/23 208 lb (94.3 kg)   Body mass index is 34.95 kg/m.    Physical Exam Constitutional:      General: She is not in acute  distress.    Appearance: Normal appearance. She is not ill-appearing.  HENT:     Head: Normocephalic and atraumatic.     Right Ear: Tympanic membrane, ear canal and external ear normal.     Left Ear: Tympanic membrane, ear canal and external ear normal.     Mouth/Throat:     Mouth: Mucous membranes are moist.     Pharynx: No oropharyngeal exudate or posterior oropharyngeal erythema.  Eyes:     Conjunctiva/sclera: Conjunctivae normal.  Cardiovascular:     Rate and Rhythm: Normal rate and regular rhythm.  Pulmonary:     Effort: Pulmonary effort is normal. No respiratory distress.     Breath sounds: Normal breath sounds. No wheezing or rales.  Musculoskeletal:     Cervical back: Neck supple. No tenderness.  Lymphadenopathy:     Cervical: No cervical adenopathy.  Skin:    General: Skin is warm and dry.  Neurological:     Mental Status: She is alert.     Motor: No weakness.            Assessment & Plan:    See Problem List for Assessment and Plan of chronic medical problems.

## 2023-07-06 ENCOUNTER — Ambulatory Visit: Payer: BC Managed Care – PPO | Admitting: Internal Medicine

## 2023-07-06 VITALS — BP 130/80 | HR 65 | Temp 98.0°F | Ht 65.0 in | Wt 210.0 lb

## 2023-07-06 DIAGNOSIS — R202 Paresthesia of skin: Secondary | ICD-10-CM | POA: Diagnosis not present

## 2023-07-06 DIAGNOSIS — F419 Anxiety disorder, unspecified: Secondary | ICD-10-CM

## 2023-07-06 DIAGNOSIS — J0100 Acute maxillary sinusitis, unspecified: Secondary | ICD-10-CM

## 2023-07-06 DIAGNOSIS — R200 Anesthesia of skin: Secondary | ICD-10-CM | POA: Diagnosis not present

## 2023-07-06 MED ORDER — PREDNISONE 10 MG PO TABS: ORAL_TABLET | ORAL | 0 refills | Status: DC

## 2023-07-06 MED ORDER — AZITHROMYCIN 250 MG PO TABS
ORAL_TABLET | ORAL | 0 refills | Status: DC
Start: 2023-07-06 — End: 2023-07-23

## 2023-07-06 MED ORDER — ESCITALOPRAM OXALATE 5 MG PO TABS: 5.0000 mg | ORAL_TABLET | Freq: Every day | ORAL | 5 refills | Status: DC

## 2023-07-06 NOTE — Assessment & Plan Note (Signed)
Acute Likely bacterial  Start  zpak otc cold medications Rest, fluid Call if no improvement

## 2023-07-06 NOTE — Patient Instructions (Addendum)
       Medications changes include :   prednisone taper, lexapro 5 mg daily, zpak      Return if symptoms worsen or fail to improve.

## 2023-07-06 NOTE — Assessment & Plan Note (Signed)
Acute This is a flare of her MS - she has had it in the past Needs steroid taper Will f/u with neuro - had to cancel last appt Start prednisone taper - 40 mg x 3 days, 30 mg x 3 days, 20 mg x 3 days, 10 mg x 3 days

## 2023-07-06 NOTE — Assessment & Plan Note (Signed)
Chronic Increased - not controlled Did not tolerate sertraline - made her feel blah, dull - not taking Start lexapro 5 mg daily Xanax 0.5 mg bid prn

## 2023-07-09 NOTE — Progress Notes (Unsigned)
[facility] [address] [place]. [place] [address] [phone]       OFFICE FOLLOW UP NOTE  Ms. [name] Date of Birth:  01-Mar-1961 Medical Record Number:  [id number]    Primary neurologist: Dr. Vickey Huger Reason for visit: sleep complaints    SUBJECTIVE:  CHIEF COMPLAINT:  No chief complaint on file.   Follow-up visit:  Prior visit: 12/11/2022 Dr. Vickey Huger  Brief HPI:   [name] [name] is a 62 y.o. female who was seen by Dr. Vickey Huger on 12/11/2022 for complaints of chronic insomnia.  Previously followed for history of MS with prior f/u visit for MS with Darrol Angel, NP in 2019 on Copaxone. She then returned to see Dr. Vickey Huger in 11/2018 for complaints of insomnia as well as neuropathy post chemo. She did have HST in 03/2019 (ordered by Dr. Mayford Knife) which showed mild sleep apnea with initiation of CPAP but patient unable to tolerate therefore discontinued. Per Dr. 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 Recommended consideration of repeat HST after at least 25lb weight loss to see if apnea still present. Advised to continue to follow with oncology for neuropathy management as neuropathy controlled on gabapentin. No mention regarding MS or medication use at prior visits, MS previously managed by Dr. Izola Price at [facility], was placed on Copaxone in 2014***?    Interval history:  Concern of recent MS flare with numbness/tingling in hands bilaterally, PCP placed on prednisone taper  Reports she continues on Copaxone 3 times per week Prior MRI brain in 2008 through           ROS:   14 system review of systems performed and negative with exception of ***  PMH:  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   Breast cancer of  upper-outer quadrant of right female breast (HCC) 10/23/2014   s/p r mastectomy, neoadj chemo completed 04/21/15; neg genetic testing   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   Chronic constipation    Chronic fatigue 06/08/2016   Conjunctiva disorder 08/06/2018   Depression 09/03/2016   GAD (generalized anxiety disorder)    GERD    Herpes zoster without complication 12/11/2016   History of angioedema    ACE inhibitors   Hx of adenomatous colonic polyps 05/2014   Hyperlipidemia    Hypertension    Lactose intolerance    Migraine headache    Multiple sclerosis, relapsing-remitting (HCC) 11/03/2015   neurologist--- dr Wilnette Kales   Personal history of chemotherapy    PMB (postmenopausal bleeding)    Sleep apnea     PSH:  Past Surgical History:  Procedure Laterality Date   BREAST BIOPSY Right 2016   x4   BREAST BIOPSY Left 03/09/2023   BREAST BIOPSY Left 03/09/2023   [place] LT BREAST BX W LOC DEV 1ST LESION IMG BX SPEC US GUIDE 03/09/2023 [facility]   COLONOSCOPY  2018   DILATATION & CURETTAGE/HYSTEROSCOPY WITH MYOSURE N/A 09/29/2022   Procedure: DILATATION & CURETTAGE/HYSTEROSCOPY WITH MYOSURE;  Surgeon: Marlow Baars, MD;  Location: [facility];  Service: Gynecology;  Laterality: N/A;   MASTECTOMY Right 2016   PORT-A-CATH REMOVAL Left 05/13/2015   Procedure: REMOVAL PORT-A-CATH;  Surgeon: Emelia Loron, MD;  Location: [facility] OR;  Service: General;  Laterality: Left;   PORTACATH PLACEMENT Left 11/06/2014  Procedure: INSERTION PORT-A-CATH;  Surgeon: Emelia Loron, MD;  Location: [facility];  Service: General;  Laterality: Left;   SIMPLE MASTECTOMY WITH AXILLARY SENTINEL NODE BIOPSY Right 05/13/2015   Procedure: RIGHT TOTAL  MASTECTOMY WITH RIGHT  AXILLARY SENTINEL NODE BIOPSY;  Surgeon: Emelia Loron, MD;  Location: [facility] OR;  Service: General;  Laterality: Right;   TUBAL LIGATION     yrs ago    Social History:  Social History    Socioeconomic History   Marital status: Single    Spouse name: Not on file   Number of children: 2   Years of education: 12   Highest education level: Not on file  Occupational History   Occupation: Engineer, drilling: [employer]  Tobacco Use   Smoking status: Never   Smokeless tobacco: Never  Vaping Use   Vaping status: Never Used  Substance and Sexual Activity   Alcohol use: No    Alcohol/week: 0.0 standard drinks of alcohol   Drug use: Never   Sexual activity: Not on file  Other Topics Concern   Not on file  Social History Narrative   Patient is a Administrator, 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    Patient is single and lives alone.    Patient is right handed.    Social Determinants of Corporate investment banker Strain: Not on file  Food Insecurity: Not on file  Transportation Needs: Not on file  Physical Activity: Not on file  Stress: Not on file  Social Connections: Not on file  Intimate Partner Violence: Not on file    Family History:  Family History  Problem Relation Age of Onset   Coronary artery disease Mother    Heart disease Mother    Diabetes Mother    Hypertension Mother    Stroke Mother    Alcohol abuse Father    Colon cancer Sister    COPD Maternal Grandmother    Hypertension Other    Hyperlipidemia Other    Rectal cancer Neg Hx    Stomach cancer Neg Hx    Esophageal cancer Neg Hx    Breast cancer Neg Hx     Medications:   Current Outpatient Medications on File Prior to Visit  Medication Sig Dispense Refill   albuterol (VENTOLIN HFA) 108 (90 Base) MCG/ACT inhaler Inhale 2 puffs into the lungs every 6 (six) hours as needed for wheezing or shortness of breath. 8 g 5   ALPRAZolam (XANAX) 0.5 MG tablet Take 1 tablet (0.5 mg total) by mouth 2 (two) times daily as needed for anxiety or sleep. 30 tablet 0   Azelastine HCl 137 MCG/SPRAY SOLN PLACE 2 SPRAYS INTO BOTH NOSTRILS 2 (TWO) TIMES  DAILY. USE IN EACH NOSTRIL AS DIRECTED (Patient taking differently: Place 2 sprays into the nose 2 (two) times daily.) 30 mL 2   azithromycin (ZITHROMAX) 250 MG tablet Take two tabs the first day and then one tab daily for four days 6 tablet 0   b complex vitamins tablet Take 1 tablet by mouth daily.     budesonide-formoterol (SYMBICORT) 160-4.5 MCG/ACT inhaler Inhale 2 puffs into the lungs 2 (two) times daily. 10.2 g 5   Cholecalciferol (VITAMIN D3) 50 MCG (2000 UT) TABS Take 1 tablet by mouth daily.     escitalopram (LEXAPRO) 5 MG tablet Take 1 tablet (5 mg total) by mouth daily. 30 tablet 5   ferrous gluconate (FERGON) 324 MG tablet  Take 1 tablet (324 mg total) by mouth 2 (two) times daily with a meal. (Patient taking differently: Take 324 mg by mouth 3 (three) times a week. PER PT TAKES MON/ WED/ FRIDAY TWICE DAILY) 180 tablet 3   fluticasone (FLONASE) 50 MCG/ACT nasal spray SHAKE LIQUID AND USE 2 SPRAYS IN EACH NOSTRIL DAILY 48 mL 2   gabapentin (NEURONTIN) 300 MG capsule TAKE 1 CAPSULE BY MOUTH TWICE A DAY 180 capsule 0   hydrochlorothiazide (HYDRODIURIL) 25 MG tablet TAKE 1 TABLET (25 MG TOTAL) BY MOUTH DAILY. 90 tablet 1   ibuprofen (ADVIL) 800 MG tablet Take 1 tablet (800 mg total) by mouth every 8 (eight) hours as needed. 60 tablet 1   ipratropium (ATROVENT) 0.06 % nasal spray Place 2 sprays into both nostrils 4 (four) times daily as needed (nasal congestion/drainage). 15 mL 5   linaclotide (LINZESS) 145 MCG CAPS capsule TAKE 1 CAPSULE BY MOUTH EVERY DAY BEFORE BREAKFAST 90 capsule 1   loratadine (CLARITIN) 10 MG tablet Take 10 mg by mouth daily as needed for itching, rhinitis or allergies.     losartan (COZAAR) 50 MG tablet Take 1 tablet (50 mg total) by mouth daily. 90 tablet 3   metoprolol tartrate (LOPRESSOR) 50 MG tablet TAKE 1 TABLET BY MOUTH TWICE A DAY 180 tablet 2   pantoprazole (PROTONIX) 40 MG tablet TAKE 1 TABLET BY MOUTH EVERY DAY 90 tablet 1   potassium chloride (KLOR-CON  M10) 10 MEQ tablet TAKE 4 TABLETS BY MOUTH EVERY DAY 360 tablet 1   predniSONE (DELTASONE) 10 MG tablet Take 4 tabs po qd x 3 days, then 3 tabs po qd x 3 days, then 2 tabs po qd x 3 days, then 1 tab po qd x 3 days 30 tablet 0   Tart Cherry 1200 MG CAPS Take 1 capsule by mouth at bedtime.     TURMERIC PO Take 1 capsule by mouth daily.     valACYclovir (VALTREX) 500 MG tablet Take 500 mg by mouth 2 (two) times daily as needed.     XARELTO 20 MG TABS tablet TAKE 1 TABLET(20 MG) BY MOUTH DAILY WITH SUPPER 90 tablet 1   No current facility-administered medications on file prior to visit.    Allergies:   Allergies  Allergen Reactions   Ace Inhibitors Anaphylaxis     Angioedema (tongue swelling)   Clarithromycin Anaphylaxis    Throat swells   Contrave [Naltrexone-Bupropion Hcl Er] Shortness Of Breath    Chest and sob   Levaquin [Levofloxacin] Other (See Comments)    Tendon pain - shoulder and calf      OBJECTIVE:  Physical Exam  There were no vitals filed for this visit. There is no height or weight on file to calculate BMI. No results found.  General: well developed, well nourished, seated, in no evident distress Head: head normocephalic and atraumatic.   Neck: supple with no carotid or supraclavicular bruits Cardiovascular: regular rate and rhythm, no murmurs Musculoskeletal: no deformity Skin:  no rash/petichiae Vascular:  Normal pulses all extremities   Neurologic Exam Mental Status: Awake and fully alert. Oriented to place and time. Recent and remote memory intact. Attention span, concentration and fund of knowledge appropriate. Mood and affect appropriate.  Cranial Nerves: Pupils equal, briskly reactive to light. Extraocular movements full without nystagmus. Visual fields full to confrontation. Hearing intact. Facial sensation intact. Face, tongue, palate moves normally and symmetrically.  Motor: Normal bulk and tone. Normal strength in all tested extremity  muscles  Sensory.: intact to touch , pinprick , position and vibratory sensation.  Coordination: Rapid alternating movements normal in all extremities. Finger-to-nose and heel-to-shin performed accurately bilaterally. Gait and Station: Arises from chair without difficulty. Stance is normal. Gait demonstrates normal stride length and balance with ***. Tandem walk and heel toe ***.  Reflexes: 1+ and symmetric. Toes downgoing.         ASSESSMENT/PLAN: [name] [name] is a 62 y.o. year old female with longstanding history of MS on Copaxone. Previously seen by Dr. Vickey Huger as well for c/o insomnia and mild OSA with CPAP intolerance.       MS :  Continue Copaxone 3 days weekly Recent lab work 05/2023 by PCP WNL including TSH and LFT's. CBC/D not completed - will need to repeat this today, prior CBC/D 10/2022 WNL Needs updated MRI brain w/wo contrast     Follow up in *** or call earlier if needed   CC:  PCP: Pincus Sanes, MD    I spent *** minutes of face-to-face and non-face-to-face time with patient.  This included previsit chart review, lab review, study review, order entry, electronic health record documentation, patient education and discussion regarding above diagnoses and treatment plan and answered all other questions to patient's satisfaction    Ihor Austin, [facility]  [facility] [address] [place], [place] [address]  Phone [phone] Fax [phone] Note: This document was prepared with digital dictation and possible smart phrase technology. Any transcriptional errors that result from this process are unintentional.

## 2023-07-10 ENCOUNTER — Ambulatory Visit: Payer: BC Managed Care – PPO | Admitting: Adult Health

## 2023-07-10 ENCOUNTER — Telehealth: Payer: Self-pay | Admitting: Adult Health

## 2023-07-10 ENCOUNTER — Other Ambulatory Visit: Payer: Self-pay | Admitting: Internal Medicine

## 2023-07-10 ENCOUNTER — Encounter: Payer: Self-pay | Admitting: Adult Health

## 2023-07-10 VITALS — BP 137/83 | HR 87 | Ht 65.0 in | Wt 207.0 lb

## 2023-07-10 DIAGNOSIS — G4700 Insomnia, unspecified: Secondary | ICD-10-CM

## 2023-07-10 DIAGNOSIS — G35 Multiple sclerosis: Secondary | ICD-10-CM

## 2023-07-10 DIAGNOSIS — Z789 Other specified health status: Secondary | ICD-10-CM

## 2023-07-10 DIAGNOSIS — G4733 Obstructive sleep apnea (adult) (pediatric): Secondary | ICD-10-CM | POA: Diagnosis not present

## 2023-07-10 NOTE — Telephone Encounter (Signed)
Pt would like a call from the office manager in regards today's office visit this morning. Pt did not want to elaborate on the reason for the call.

## 2023-07-10 NOTE — Patient Instructions (Addendum)
Your Plan:  Consider doing CBT-I (cognitive behavioral therapy for insomnia) - if interested, please let me know  Recommend further discuss regarding treatment for insomnia with Dr. Lawerance Bach or consider being seen by psychiatry to further assist with this  Please ensure good sleep hygiene to help with insomnia such as sleeping in a dark cool room, no TVs or use of electronics 30 minutes prior to going to bed, and avoid having pets in the bed  Consider repeat sleep study, we will let you know if we are able to do this now or if we need to wait until further weigh loss. Keep up the good work!!   Would recommend repeat MRI brain to evaluate for any progression of MS, if you are interested in pursing in the future, please let me know      Follow up in 1 year with Dr. Vickey Huger for MS follow up or call earlier if needed     Thank you for coming to see [place] at [facility]. I hope we have been able to provide you high quality care today.  You may receive a patient satisfaction survey over the next few weeks. We would appreciate your feedback and comments so that we may continue to improve ourselves and the health of our patients.

## 2023-07-11 NOTE — Telephone Encounter (Signed)
Returned patient's call. She was unable to talk at this moment and has asked that I call her back after 3pm today, after she is finished with her work shift.

## 2023-07-12 NOTE — Telephone Encounter (Signed)
Spoke with patient yesterday about her visit she had the day before. After explaining the plan that Shanda Bumps, NP and Dr. Vickey Huger have recommended, pt is amenable with proceeding to repeat a home sleep test. Pt was very appreciative of my call and states she understands now how our next steps of treatment for her untreated OSA. Pt should follow up with Dr Vickey Huger at next visit.

## 2023-07-22 ENCOUNTER — Encounter: Payer: Self-pay | Admitting: Family Medicine

## 2023-07-22 NOTE — Progress Notes (Unsigned)
Subjective:    Patient ID: [name] [name], female    DOB: Aug 12, 1961, 62 y.o.   MRN: [id number]      HPI Maddison is here for No chief complaint on file.   Watery eyes -    Insomnia --  will be having a sleep study  - intolerant os cpap but may be a candidate for oral appliance or inspire.     Medications and allergies reviewed with patient and updated if appropriate.  Current Outpatient Medications on File Prior to Visit  Medication Sig Dispense Refill   albuterol (VENTOLIN HFA) 108 (90 Base) MCG/ACT inhaler Inhale 2 puffs into the lungs every 6 (six) hours as needed for wheezing or shortness of breath. 8 g 5   ALPRAZolam (XANAX) 0.5 MG tablet Take 1 tablet (0.5 mg total) by mouth 2 (two) times daily as needed for anxiety or sleep. 30 tablet 0   Azelastine HCl 137 MCG/SPRAY SOLN PLACE 2 SPRAYS INTO BOTH NOSTRILS 2 (TWO) TIMES DAILY. USE IN EACH NOSTRIL AS DIRECTED (Patient taking differently: Place 2 sprays into the nose 2 (two) times daily.) 30 mL 2   azithromycin (ZITHROMAX) 250 MG tablet Take two tabs the first day and then one tab daily for four days 6 tablet 0   b complex vitamins tablet Take 1 tablet by mouth daily.     budesonide-formoterol (SYMBICORT) 160-4.5 MCG/ACT inhaler Inhale 2 puffs into the lungs 2 (two) times daily. 10.2 g 5   Cholecalciferol (VITAMIN D3) 50 MCG (2000 UT) TABS Take 1 tablet by mouth daily.     escitalopram (LEXAPRO) 5 MG tablet Take 1 tablet (5 mg total) by mouth daily. 30 tablet 5   ferrous gluconate (FERGON) 324 MG tablet Take 1 tablet (324 mg total) by mouth 2 (two) times daily with a meal. (Patient taking differently: Take 324 mg by mouth 3 (three) times a week. PER PT TAKES MON/ WED/ FRIDAY TWICE DAILY) 180 tablet 3   fluticasone (FLONASE) 50 MCG/ACT nasal spray SHAKE LIQUID AND USE 2 SPRAYS IN EACH NOSTRIL DAILY 48 mL 2   gabapentin (NEURONTIN) 300 MG capsule TAKE 1 CAPSULE BY MOUTH TWICE A DAY 180 capsule 0   hydrochlorothiazide  (HYDRODIURIL) 25 MG tablet TAKE 1 TABLET (25 MG TOTAL) BY MOUTH DAILY. 90 tablet 1   ibuprofen (ADVIL) 800 MG tablet Take 1 tablet (800 mg total) by mouth every 8 (eight) hours as needed. 60 tablet 1   ipratropium (ATROVENT) 0.06 % nasal spray Place 2 sprays into both nostrils 4 (four) times daily as needed (nasal congestion/drainage). 15 mL 5   KLOR-CON M10 10 MEQ tablet TAKE 4 TABLETS BY MOUTH EVERY DAY 360 tablet 1   linaclotide (LINZESS) 145 MCG CAPS capsule TAKE 1 CAPSULE BY MOUTH EVERY DAY BEFORE BREAKFAST 90 capsule 1   loratadine (CLARITIN) 10 MG tablet Take 10 mg by mouth daily as needed for itching, rhinitis or allergies.     losartan (COZAAR) 50 MG tablet Take 1 tablet (50 mg total) by mouth daily. 90 tablet 3   metoprolol tartrate (LOPRESSOR) 50 MG tablet TAKE 1 TABLET BY MOUTH TWICE A DAY 180 tablet 2   pantoprazole (PROTONIX) 40 MG tablet TAKE 1 TABLET BY MOUTH EVERY DAY 90 tablet 1   predniSONE (DELTASONE) 10 MG tablet Take 4 tabs po qd x 3 days, then 3 tabs po qd x 3 days, then 2 tabs po qd x 3 days, then 1 tab po qd x 3 days 30 tablet  0   Tart Cherry 1200 MG CAPS Take 1 capsule by mouth at bedtime.     TURMERIC PO Take 1 capsule by mouth daily.     valACYclovir (VALTREX) 500 MG tablet Take 500 mg by mouth 2 (two) times daily as needed.     XARELTO 20 MG TABS tablet TAKE 1 TABLET(20 MG) BY MOUTH DAILY WITH SUPPER 90 tablet 1   No current facility-administered medications on file prior to visit.    Review of Systems     Objective:  There were no vitals filed for this visit. BP Readings from Last 3 Encounters:  07/10/23 137/83  07/06/23 130/80  05/25/23 130/74   Wt Readings from Last 3 Encounters:  07/10/23 207 lb (93.9 kg)  07/06/23 210 lb (95.3 kg)  05/25/23 207 lb (93.9 kg)   There is no height or weight on file to calculate BMI.    Physical Exam         Assessment & Plan:    See Problem List for Assessment and Plan of chronic medical problems.

## 2023-07-23 ENCOUNTER — Ambulatory Visit: Payer: BC Managed Care – PPO

## 2023-07-23 ENCOUNTER — Encounter: Payer: Self-pay | Admitting: Internal Medicine

## 2023-07-23 ENCOUNTER — Ambulatory Visit: Payer: BC Managed Care – PPO | Admitting: Internal Medicine

## 2023-07-23 VITALS — BP 110/76 | HR 99 | Temp 98.1°F | Ht 65.0 in | Wt 207.0 lb

## 2023-07-23 DIAGNOSIS — I10 Essential (primary) hypertension: Secondary | ICD-10-CM | POA: Diagnosis not present

## 2023-07-23 DIAGNOSIS — M25562 Pain in left knee: Secondary | ICD-10-CM

## 2023-07-23 DIAGNOSIS — L989 Disorder of the skin and subcutaneous tissue, unspecified: Secondary | ICD-10-CM

## 2023-07-23 DIAGNOSIS — H04203 Unspecified epiphora, bilateral lacrimal glands: Secondary | ICD-10-CM

## 2023-07-23 NOTE — Assessment & Plan Note (Signed)
Chronic ?Blood pressure well controlled ?Continue metoprolol 50 mg twice daily, losartan 50 mg daily, HCTZ 25 mg daily ?

## 2023-07-23 NOTE — Patient Instructions (Addendum)
     Have an xray today downstairs.    Medications changes include :   none    A referral was ordered dermatology and someone will call you to schedule an appointment.     Return if symptoms worsen or fail to improve.

## 2023-07-23 NOTE — Assessment & Plan Note (Signed)
Acute Fell 2 weeks ago onto her knees and still has some bruising, swelling and tenderness Will go ahead and get an x-ray although doubt fracture Symptomatic treatment

## 2023-07-23 NOTE — Assessment & Plan Note (Signed)
Acute Both eyes are watering-clear liquid-teary ?  Allergy related versus dry eyes Has been told she has dry eyes in the past Has tried several things that have not been successful Recommend that she see her eye doctor for further evaluation and appropriate treatment and she will schedule an appointment

## 2023-07-23 NOTE — Assessment & Plan Note (Signed)
Acute Having increased hyperpigmented marks on arms and legs Did do.  Benign Referral to dermatology

## 2023-07-24 ENCOUNTER — Other Ambulatory Visit: Payer: Self-pay

## 2023-07-24 DIAGNOSIS — M25562 Pain in left knee: Secondary | ICD-10-CM

## 2023-07-28 ENCOUNTER — Other Ambulatory Visit: Payer: Self-pay | Admitting: Internal Medicine

## 2023-07-31 ENCOUNTER — Telehealth: Payer: Self-pay | Admitting: Internal Medicine

## 2023-07-31 ENCOUNTER — Ambulatory Visit: Payer: BC Managed Care – PPO | Admitting: Neurology

## 2023-07-31 DIAGNOSIS — G4733 Obstructive sleep apnea (adult) (pediatric): Secondary | ICD-10-CM

## 2023-07-31 DIAGNOSIS — G4700 Insomnia, unspecified: Secondary | ICD-10-CM

## 2023-07-31 DIAGNOSIS — Z789 Other specified health status: Secondary | ICD-10-CM

## 2023-07-31 NOTE — Telephone Encounter (Signed)
Can not advise, as she has not been seen in 4 years. She needs to go to ER if she is in distress. Otherwise, routine office appointment with me or APP.

## 2023-07-31 NOTE — Telephone Encounter (Signed)
Inbound call from patient, states she would like to speak to Dr. Lamar Sprinkles nurse in regards to some changes in bowel habits. Please advise.

## 2023-07-31 NOTE — Telephone Encounter (Signed)
Pt reports she was having some constipation and her PCP started her on Linzess daily. Reports she is having 1 BM daily but PCP also told her if she felt like she needed to go more she could increase to as needed. Pt is concerned because her abdomen is quite distended. Pt states people as asking her if she is pregnant. She is concerned and wanted to know if she needed to see Dr. Marina Goodell or if this may be related to only having 1 bm/day, or if it is the linzess. She states miralax does not work for her and prior to linzess she was taking MOM and Mag Citrate as needed. Please advise.

## 2023-08-01 NOTE — Telephone Encounter (Signed)
Spoke with pt and let her know she needs to be seen as she has not been here in 4 years. Offered pt an appt on 11/20 but pt states she cannot make that time. Reports she will check her schedule and call back regarding an appt.

## 2023-08-14 NOTE — Progress Notes (Signed)
[name] [name] 62 year old female 11-21-1960   HOME SLEEP TEST REPORT ( by Watch PAT)  MAIL-OUT TEST  STUDY DATE:  08-14-2023    ORDERING CLINICIAN: Ihor Austin , NP  REFERRING CLINICIAN: PCP    CLINICAL INFORMATION/HISTORY: atypical atrial flutter, SVT,  Insomnia. Chronic, with sleep hygiene challenges- [name] [name] goes to bed not to sleep but to watch TV, there is nothing else to do.  Retirement has been not so good for her, [name] [name] likes interaction and the being on her feet . [name] [name] has explored returning to work part time.   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 The patient has not used the recommended CPAP.  07-10-2023: [name] [name] is a 62 y.o. female who was seen by Dr. Vickey Huger on 12/11/2022 for complaints of chronic insomnia.  Previously followed for history of MS with prior f/u visit for MS with Darrol Angel, NP in 2019 on Copaxone. [name] [name] then returned to see Dr. Vickey Huger in 11/2018 for complaints of insomnia as well as neuropathy post chemo. [name] [name] did have HST in 03/2019 (ordered by Dr. Mayford Knife) which showed mild sleep apnea with initiation of CPAP but patient unable to tolerate therefore discontinued. Per Dr. 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 Recommended consideration of repeat HST after at least 25lb weight loss to see if apnea still present. Advised to continue to follow with oncology for neuropathy management as neuropathy controlled on gabapentin     Epworth sleepiness score: NA /24.   BMI: 34.4 - reduced by 2 points  since last visit.  Neck Circumference: NA    FINDINGS:   Sleep Summary following CMS criteria   Total Recording Time (hours, min): 9 hours 30 minutes      Total Sleep Time (hours, min):     6 hours 53 minutes            Percent REM (%):      14%                                  Respiratory  Indices by CMS criteria, (patient is on medical disability )  and followed by data for AASM criteria   Calculated pAHI (per hour):    5.7/h  -no central events were present, all obstructive hypopnea and apnea.     (12.8/h)                  REM pAHI:       11.5/h       (19.8/h)                                   NREM pAHI:    4.8/h       (11.6/h)                  Supine AHI:   By CMS criteria 3.8/h and by AASM criteria 12/h.  The patient slept 236 minutes in supine and 124 minutes on her right side associated with a CMS AHI of 4.4/h and an AASM criteria AHI of 11.3/h.  Oxygen Saturation Statistics:   Oxygen Saturation (%) Mean:    94%             Between a nadir of 87% and a maximum saturation of 96%       O2 Saturation (minutes) <89%: Under 1 minute         Pulse Rate Statistics:   Pulse Mean (bpm):    70 bpm             Pulse Range:    Between 48 and 104 bpm.  Please note the cardiac rhythm data cannot be extrapolated from this device.             IMPRESSION:  This HST confirms the presence of mild sleep apnea when following AASM criteria.   I am unsure if the patient is a medicare recipient YET,  but if so her an AHI would be even milder 5.7./H  and treatment would be optional.  There is no associated hypoxia, there is no true bradycardia tachycardia present.    The main problem is fragmented sleep and insomnia,  which is chronic. Chronic insomnia is to be treated by cognitive behavioral therapy it does not respond to treatment of apnea.   RECOMMENDATION: The patient has in the past tried CPAP and could not tolerate it.  I would offer the patient a trial of CPAP once more and employing different interfaces nasal pillow etc. to reduce the aversion to CPAP.    INTERPRETING PHYSICIAN:   Melvyn Novas, MD

## 2023-08-22 ENCOUNTER — Other Ambulatory Visit (HOSPITAL_COMMUNITY): Payer: Self-pay

## 2023-08-22 ENCOUNTER — Other Ambulatory Visit: Payer: Self-pay

## 2023-08-22 ENCOUNTER — Telehealth: Payer: Self-pay | Admitting: Adult Health

## 2023-08-22 NOTE — Telephone Encounter (Signed)
Pt called wanting to know when she will be called with her sleep study results. Pt states that she received a message on mychart but it was a week ago and she still has not heard back. Please advise.

## 2023-08-26 DIAGNOSIS — G4700 Insomnia, unspecified: Secondary | ICD-10-CM | POA: Insufficient documentation

## 2023-08-26 DIAGNOSIS — G4733 Obstructive sleep apnea (adult) (pediatric): Secondary | ICD-10-CM | POA: Insufficient documentation

## 2023-08-26 DIAGNOSIS — Z789 Other specified health status: Secondary | ICD-10-CM | POA: Insufficient documentation

## 2023-08-26 NOTE — Procedures (Signed)
[facility] 62 year old female Oct 07, 1960   HOME SLEEP TEST REPORT ( by Watch PAT)  MAIL-OUT TEST  STUDY DATE:  08-14-2023    ORDERING CLINICIAN: Ihor Austin , NP  REFERRING CLINICIAN: PCP    CLINICAL INFORMATION/HISTORY: atypical atrial flutter, SVT,  Insomnia. Chronic, with sleep hygiene challenges- she goes to bed not to sleep but to watch TV, there is nothing else to do.  Retirement has been not so good for her, she likes interaction and the being on her feet . She has explored returning to work part time.   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 The patient has not used the recommended CPAP.  07-10-2023: [name] [name] is a 62 y.o. female who was seen by Dr. Vickey Huger on 12/11/2022 for complaints of chronic insomnia.  Previously followed for history of MS with prior f/u visit for MS with Darrol Angel, NP in 2019 on Copaxone. She then returned to see Dr. Vickey Huger in 11/2018 for complaints of insomnia as well as neuropathy post chemo. She did have HST in 03/2019 (ordered by Dr. Mayford Knife) which showed mild sleep apnea with initiation of CPAP but patient unable to tolerate therefore discontinued. Per Dr. 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 Recommended consideration of repeat HST after at least 25lb weight loss to see if apnea still present. Advised to continue to follow with oncology for neuropathy management as neuropathy controlled on gabapentin     Epworth sleepiness score: NA /24. FSS : NA/ 63 POINTS. GDS : NA   BMI: 34.4 - reduced by 2 points  since last visit.  Neck Circumference: NA    FINDINGS:   Sleep Summary :   Total Recording Time (hours, min): 9 hours 30 minutes      Total Sleep Time (hours, min):     6 hours 53 minutes            Percent REM (%):      14%                                   Respiratory Indices by CMS criteria, (patient is on medical disability )  and followed by data scoring per AASM criteria ().   Calculated pAHI (per hour):    5.7/h  -no central events were present, all obstructive hypopnea and apnea.     (12.8/h)                  REM pAHI:       11.5/h       (19.8/h)                                   NREM pAHI:    4.8/h       (11.6/h)                  Supine AHI:   By CMS criteria 3.8/h and by AASM criteria 12/h.  The patient slept 236 minutes in supine and 124 minutes on her right side associated with a CMS AHI of 4.4/h and an AASM criteria AHI of 11.3/h.  Oxygen Saturation Statistics:   Oxygen Saturation (%) Mean:    94%             Between a nadir of 87% and a maximum saturation of 96%       O2 Saturation (minutes) <89%: Under 1 minute         Pulse Rate Statistics:   Pulse Mean (bpm):    70 bpm             Pulse Range:    Between 48 and 104 bpm.  Please note the cardiac rhythm data cannot be extrapolated from this device.             IMPRESSION:  This HST confirms the presence of mild sleep apnea when following AASM criteria.  This is REM sleep accentuated sleep apnea that can be treated or reduced by weight loss.   I am unsure if the patient is a medicare recipient YET,  but if so her an AHI would be even milder 5.7./H and treatment would be optional.  There is no associated hypoxia, there is no true bradycardia tachycardia present.    The main problem is fragmented sleep and insomnia,  which is chronic. Chronic insomnia is to be treated by cognitive behavioral therapy it does not respond to treatment of apnea.   RECOMMENDATION: The patient has in the past tried CPAP and could not tolerate it.  I would offer the patient a trial of CPAP once more and employing different interfaces nasal pillow etc. to reduce the aversion to CPAP.    INTERPRETING PHYSICIAN:   Melvyn Novas, MD

## 2023-08-29 ENCOUNTER — Telehealth: Payer: Self-pay

## 2023-08-29 NOTE — Telephone Encounter (Signed)
Called patient to informed her of Repeat HST presence of mild sleep apnea. Patient isn't willing to restart CPAP or to be referral for an CBT-I (cognitive behavioral therapy for insomnia). Patient stated no one has talked about CBT and Dr burns already knows about her Insomnia and he had nothing to do with her insomnia. Patient seemed irritated, upset and ending up hanging up the phone as I'm certifying if she was interested or not.

## 2023-08-29 NOTE — Telephone Encounter (Signed)
-----   Message from Ihor Austin sent at 08/27/2023  4:08 PM EST ----- Please advise patient that repeat sleep study shows presence of mild sleep apnea.  Dr. Vickey Huger recommended retrial of CPAP and trial different interfaces such as nasal pillow, etc to reduce aversion to CPAP. If patient willing to restart, please let me know and I will place order. In regards to her insomnia, this is chronic and rec'd tx with CBT-I which has been previously discussed. Thank you.

## 2023-09-03 NOTE — Progress Notes (Unsigned)
[name] [address] [place] [address] Phone: [phone] Subjective:   INadine Counts, am serving as a scribe for Dr. Antoine Primas.  I'm seeing this patient by the request  of:  Pincus Sanes, MD  CC: Right foot pain right leg pain  [id number]  04/18/2023 Patient did have reaccumulation quickly. Did have aspiration again today. Hopefully this will be more beneficial. If worsening pain or reoccurrence again would need to consider the possibility of surgical intervention. Patient does on the medial aspect, mild stress reaction noted on the MRI but nothing severe. Increase activity slowly otherwise. Discussed proper shoes and compression with activity. Follow-up with me again in 6 weeks   Updated 09/04/2023 [name] [name] is a 62 y.o. female coming in with complaint of R foot pain. Lateral side and heel pain. Started up again and getting worse. Bump on R leg followed by burning.       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   Chronic constipation    Chronic fatigue 06/08/2016   Conjunctiva disorder 08/06/2018   Depression 09/03/2016   GAD (generalized anxiety disorder)    GERD    Herpes zoster without complication 12/11/2016   History of angioedema    ACE inhibitors   Hx of adenomatous colonic polyps 05/2014   Hyperlipidemia    Hypertension    Lactose intolerance    Migraine headache    Multiple sclerosis, relapsing-remitting (HCC) 11/03/2015   neurologist--- dr Wilnette Kales   Personal history of chemotherapy    PMB (postmenopausal bleeding)    Sleep apnea    Past Surgical  History:  Procedure Laterality Date   BREAST BIOPSY Right 2016   x4   BREAST BIOPSY Left 03/09/2023   BREAST BIOPSY Left 03/09/2023   [place] LT BREAST BX W LOC DEV 1ST LESION IMG BX SPEC US GUIDE 03/09/2023 [facility]   COLONOSCOPY  2018   DILATATION & CURETTAGE/HYSTEROSCOPY WITH MYOSURE N/A 09/29/2022   Procedure: DILATATION & CURETTAGE/HYSTEROSCOPY WITH MYOSURE;  Surgeon: Marlow Baars, MD;  Location: [facility];  Service: Gynecology;  Laterality: N/A;   MASTECTOMY Right 2016   PORT-A-CATH REMOVAL Left 05/13/2015   Procedure: REMOVAL PORT-A-CATH;  Surgeon: Emelia Loron, MD;  Location: [facility] OR;  Service: General;  Laterality: Left;   PORTACATH PLACEMENT Left 11/06/2014   Procedure: INSERTION PORT-A-CATH;  Surgeon: Emelia Loron, MD;  Location: [facility];  Service: General;  Laterality: Left;   SIMPLE MASTECTOMY WITH AXILLARY SENTINEL NODE BIOPSY Right 05/13/2015   Procedure: RIGHT TOTAL  MASTECTOMY WITH RIGHT  AXILLARY SENTINEL NODE BIOPSY;  Surgeon: Emelia Loron, MD;  Location: [facility] OR;  Service: General;  Laterality: Right;   TUBAL LIGATION     yrs ago   Social History   Socioeconomic History   Marital status: Single    Spouse name: Not on file   Number of children: 2   Years of education: 12   Highest education level: Not on file  Occupational History   Occupation: Engineer, drilling: [employer]  Tobacco Use   Smoking status: Never   Smokeless tobacco: Never  Vaping Use  Vaping status: Never Used  Substance and Sexual Activity   Alcohol use: No    Alcohol/week: 0.0 standard drinks of alcohol   Drug use: Never   Sexual activity: Not on file  Other Topics Concern   Not on file  Social History Narrative   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    Patient has a Barista.    Patient is single and lives alone.    Patient is right handed.    Social Drivers of Health    Financial Resource Strain: Patient Declined (09/02/2023)   Overall Financial Resource Strain (CARDIA)    Difficulty of Paying Living Expenses: Patient declined  Food Insecurity: Patient Declined (09/02/2023)   Hunger Vital Sign    Worried About Running Out of Food in the Last Year: Patient declined    Ran Out of Food in the Last Year: Patient declined  Transportation Needs: Patient Declined (09/02/2023)   PRAPARE - Administrator, Civil Service (Medical): Patient declined    Lack of Transportation (Non-Medical): Patient declined  Physical Activity: Unknown (09/02/2023)   Exercise Vital Sign    Days of Exercise per Week: 0 days    Minutes of Exercise per Session: Not on file  Stress: No Stress Concern Present (09/02/2023)   [employer] of Occupational Health - Occupational Stress Questionnaire    Feeling of Stress : Not at all  Social Connections: Moderately Integrated (09/02/2023)   Social Connection and Isolation Panel [NHANES]    Frequency of Communication with Friends and Family: More than three times a week    Frequency of Social Gatherings with Friends and Family: More than three times a week    Attends Religious Services: More than 4 times per year    Active Member of [employer] or Organizations: Yes    Attends Engineer, structural: More than 4 times per year    Marital Status: Never married   Allergies  Allergen Reactions   Ace Inhibitors Anaphylaxis     Angioedema (tongue swelling)   Clarithromycin Anaphylaxis    Throat swells   Contrave [Naltrexone-Bupropion Hcl Er] Shortness Of Breath    Chest and sob   Levaquin [Levofloxacin] Other (See Comments)    Tendon pain - shoulder and calf   Family History  Problem Relation Age of Onset   Coronary artery disease Mother    Heart disease Mother    Diabetes Mother    Hypertension Mother    Stroke Mother    Alcohol abuse Father    Colon cancer Sister    COPD Maternal Grandmother    Hypertension  Other    Hyperlipidemia Other    Rectal cancer Neg Hx    Stomach cancer Neg Hx    Esophageal cancer Neg Hx    Breast cancer Neg Hx     Current Outpatient Medications (Endocrine & Metabolic):    predniSONE (DELTASONE) 10 MG tablet, Take 4 tabs po qd x 3 days, then 3 tabs po qd x 3 days, then 2 tabs po qd x 3 days, then 1 tab po qd x 3 days  Current Outpatient Medications (Cardiovascular):    hydrochlorothiazide (HYDRODIURIL) 25 MG tablet, TAKE 1 TABLET (25 MG TOTAL) BY MOUTH DAILY.   losartan (COZAAR) 50 MG tablet, Take 1 tablet (50 mg total) by mouth daily.   metoprolol tartrate (LOPRESSOR) 50 MG tablet, TAKE 1 TABLET BY MOUTH TWICE A DAY  Current Outpatient Medications (Respiratory):    albuterol (VENTOLIN HFA) 108 (  90 Base) MCG/ACT inhaler, Inhale 2 puffs into the lungs every 6 (six) hours as needed for wheezing or shortness of breath.   Azelastine HCl 137 MCG/SPRAY SOLN, PLACE 2 SPRAYS INTO BOTH NOSTRILS 2 (TWO) TIMES DAILY. USE IN EACH NOSTRIL AS DIRECTED (Patient taking differently: Place 2 sprays into the nose 2 (two) times daily.)   budesonide-formoterol (SYMBICORT) 160-4.5 MCG/ACT inhaler, Inhale 2 puffs into the lungs 2 (two) times daily.   fluticasone (FLONASE) 50 MCG/ACT nasal spray, SHAKE LIQUID AND USE 2 SPRAYS IN EACH NOSTRIL DAILY   ipratropium (ATROVENT) 0.06 % nasal spray, Place 2 sprays into both nostrils 4 (four) times daily as needed (nasal congestion/drainage).   loratadine (CLARITIN) 10 MG tablet, Take 10 mg by mouth daily as needed for itching, rhinitis or allergies.  Current Outpatient Medications (Analgesics):    ibuprofen (ADVIL) 800 MG tablet, Take 1 tablet (800 mg total) by mouth every 8 (eight) hours as needed.  Current Outpatient Medications (Hematological):    ferrous gluconate (FERGON) 324 MG tablet, Take 1 tablet (324 mg total) by mouth 2 (two) times daily with a meal. (Patient taking differently: Take 324 mg by mouth 3 (three) times a week. PER PT TAKES  MON/ WED/ FRIDAY TWICE DAILY)   XARELTO 20 MG TABS tablet, TAKE 1 TABLET(20 MG) BY MOUTH DAILY WITH SUPPER  Current Outpatient Medications (Other):    doxycycline (VIBRA-TABS) 100 MG tablet, Take 1 tablet (100 mg total) by mouth 2 (two) times daily.   fluconazole (DIFLUCAN) 100 MG tablet, Take 1 tablet (100 mg total) by mouth daily.   Vitamin D, Ergocalciferol, (DRISDOL) 1.25 MG (50000 UNIT) CAPS capsule, Take 1 capsule (50,000 Units total) by mouth every 7 (seven) days.   ALPRAZolam (XANAX) 0.5 MG tablet, Take 1 tablet (0.5 mg total) by mouth 2 (two) times daily as needed for anxiety or sleep.   b complex vitamins tablet, Take 1 tablet by mouth daily.   Cholecalciferol (VITAMIN D3) 50 MCG (2000 UT) TABS, Take 1 tablet by mouth daily.   escitalopram (LEXAPRO) 5 MG tablet, TAKE 1 TABLET (5 MG TOTAL) BY MOUTH DAILY.   gabapentin (NEURONTIN) 300 MG capsule, TAKE 1 CAPSULE BY MOUTH TWICE A DAY   KLOR-CON M10 10 MEQ tablet, TAKE 4 TABLETS BY MOUTH EVERY DAY   linaclotide (LINZESS) 145 MCG CAPS capsule, TAKE 1 CAPSULE BY MOUTH EVERY DAY BEFORE BREAKFAST   pantoprazole (PROTONIX) 40 MG tablet, TAKE 1 TABLET BY MOUTH EVERY DAY   Tart Cherry 1200 MG CAPS, Take 1 capsule by mouth at bedtime.   TURMERIC PO, Take 1 capsule by mouth daily.   valACYclovir (VALTREX) 500 MG tablet, Take 500 mg by mouth 2 (two) times daily as needed.   Reviewed prior external information including notes and imaging from  primary care provider As well as notes that were available from care everywhere and other healthcare systems.  Past medical history, social, surgical and family history all reviewed in electronic medical record.  No pertanent information unless stated regarding to the chief complaint.   Review of Systems:  No headache, visual changes, nausea, vomiting, diarrhea, constipation, dizziness, abdominal pain, skin rash, fevers, chills, night sweats, weight loss, swollen lymph nodes, body aches, joint swelling,  chest pain, shortness of breath, mood changes. POSITIVE muscle aches  Objective  Blood pressure 122/76, pulse (!) 111, height 5\' 5"  (1.651 m), weight 208 lb (94.3 kg), last menstrual period 11/01/2012, SpO2 97%.   General: No apparent distress alert and oriented x3 mood and  affect normal, dressed appropriately.  HEENT: Pupils equal, extraocular movements intact  Respiratory: Patient's speak in full sentences and does not appear short of breath  Cardiovascular: No lower extremity edema, non tender, no erythema  Antalgic gait noted.  Patient does have severe tenderness over the fourth and fifth metatarsal approximately.  Some swelling in the area also noted.  No erythema of the skin.  Patient's right inguinal area does have what appears to be an ingrown hair with some erythema noted.  Limited muscular skeletal ultrasound was performed and interpreted by Antoine Primas, M  Limited ultrasound of patient's right foot shows that there is a cortical irregularity noted of the fourth and fifth metatarsals that is consistent with likely more of a stress reaction.  No significant displacement noted.  Tender with even compression in the area.   Impression and Recommendations:     The above documentation has been reviewed and is accurate and complete Judi Saa, DO

## 2023-09-04 ENCOUNTER — Ambulatory Visit (INDEPENDENT_AMBULATORY_CARE_PROVIDER_SITE_OTHER): Payer: BC Managed Care – PPO | Admitting: Family Medicine

## 2023-09-04 ENCOUNTER — Other Ambulatory Visit: Payer: Self-pay

## 2023-09-04 VITALS — BP 122/76 | HR 111 | Ht 65.0 in | Wt 208.0 lb

## 2023-09-04 DIAGNOSIS — L02415 Cutaneous abscess of right lower limb: Secondary | ICD-10-CM | POA: Diagnosis not present

## 2023-09-04 DIAGNOSIS — M84374A Stress fracture, right foot, initial encounter for fracture: Secondary | ICD-10-CM

## 2023-09-04 DIAGNOSIS — M79671 Pain in right foot: Secondary | ICD-10-CM

## 2023-09-04 DIAGNOSIS — M79672 Pain in left foot: Secondary | ICD-10-CM

## 2023-09-04 MED ORDER — DOXYCYCLINE HYCLATE 100 MG PO TABS: 100.0000 mg | ORAL_TABLET | Freq: Two times a day (BID) | ORAL | 0 refills | Status: DC

## 2023-09-04 MED ORDER — VITAMIN D (ERGOCALCIFEROL) 1.25 MG (50000 UNIT) PO CAPS: 50000.0000 [IU] | ORAL_CAPSULE | ORAL | 0 refills | Status: DC

## 2023-09-04 MED ORDER — FLUCONAZOLE 100 MG PO TABS: 100.0000 mg | ORAL_TABLET | Freq: Every day | ORAL | 0 refills | Status: DC

## 2023-09-04 NOTE — Patient Instructions (Addendum)
You have 14 days to return or exchange your brace Call [phone], then return the brace to our office  Vit D once weekly Stop daily vit d When sitting come out of boot and move ankle Note for work for See you again in 4 weeks

## 2023-09-05 ENCOUNTER — Encounter: Payer: Self-pay | Admitting: Family Medicine

## 2023-09-05 ENCOUNTER — Encounter: Payer: Self-pay | Admitting: Internal Medicine

## 2023-09-05 DIAGNOSIS — L02415 Cutaneous abscess of right lower limb: Secondary | ICD-10-CM | POA: Insufficient documentation

## 2023-09-05 DIAGNOSIS — M84374A Stress fracture, right foot, initial encounter for fracture: Secondary | ICD-10-CM | POA: Insufficient documentation

## 2023-09-05 NOTE — Patient Instructions (Incomplete)
Medications changes include :   gabapentin 100 mg at night in addition to the 300 mg pill for total of 400 mg.    Start cymbalta 20 mg daily.  Stop lexapro   NO TV in Bedroom.      Quality Sleep Information, Adult Quality sleep is important for your mental and physical health. It also improves your quality of life. Quality sleep means you: Are asleep for most of the time you are in bed. Fall asleep within 30 minutes. Wake up no more than once a night. Are awake for no longer than 20 minutes if you do wake up during the night. Most adults need 7-8 hours of quality sleep each night. How can poor sleep affect me? If you do not get enough quality sleep, you may have: Mood swings. Daytime sleepiness. Decreased alertness, reaction time, and concentration. Sleep disorders, such as insomnia and sleep apnea. Difficulty with: Solving problems. Coping with stress. Paying attention. These issues may affect your performance and productivity at work, school, and home. Lack of sleep may also put you at higher risk for accidents, suicide, and risky behaviors. If you do not get quality sleep, you may also be at higher risk for several health problems, including: Infections. Type 2 diabetes. Heart disease. High blood pressure. Obesity. Worsening of long-term conditions, like arthritis, kidney disease, depression, Parkinson's disease, and epilepsy. What actions can I take to get more quality sleep? Sleep schedule and routine Stick to a sleep schedule. Go to sleep and wake up at about the same time each day. Do not try to sleep less on weekdays and make up for lost sleep on weekends. This does not work. Limit naps during the day to 30 minutes or less. Do not take naps in the late afternoon. Make time to relax before bed. Reading, listening to music, or taking a hot bath promotes quality sleep. Make your bedroom a place that promotes quality sleep. Keep your bedroom dark, quiet, and at a  comfortable room temperature. Make sure your bed is comfortable. Avoid using electronic devices that give off bright blue light for 30 minutes before bedtime. Your brain perceives bright blue light as sunlight. This includes television, phones, and computers. If you are lying awake in bed for longer than 20 minutes, get up and do a relaxing activity until you feel sleepy. Lifestyle     Try to get at least 30 minutes of exercise on most days. Do not exercise 2-3 hours before going to bed. Do not use any products that contain nicotine or tobacco. These products include cigarettes, chewing tobacco, and vaping devices, such as e-cigarettes. If you need help quitting, ask your health care provider. Do not drink caffeinated beverages for at least 8 hours before going to bed. Coffee, tea, and some sodas contain caffeine. Do not drink alcohol or eat large meals close to bedtime. Try to get at least 30 minutes of sunlight every day. Morning sunlight is best. Medical concerns Work with your health care provider to treat medical conditions that may affect sleeping, such as: Nasal obstruction. Snoring. Sleep apnea and other sleep disorders. Talk to your health care provider if you think any of your prescription medicines may cause you to have difficulty falling or staying asleep. If you have sleep problems, talk with a sleep consultant. If you think you have a sleep disorder, talk with your health care provider about getting evaluated by a specialist. Where to find more information Sleep Foundation:  sleepfoundation.org American Academy of Sleep Medicine: aasm.org Centers for Disease Control and Prevention (CDC): TonerPromos.no Contact a health care provider if: You have trouble getting to sleep or staying asleep. You often wake up very early in the morning and cannot get back to sleep. You have daytime sleepiness. You have daytime sleep attacks of suddenly falling asleep and sudden muscle weakness  (narcolepsy). You have a tingling sensation in your legs with a strong urge to move your legs (restless legs syndrome). You stop breathing briefly during sleep (sleep apnea). You think you have a sleep disorder or are taking a medicine that is affecting your quality of sleep. Summary Most adults need 7-8 hours of quality sleep each night. Getting enough quality sleep is important for your mental and physical health. Make your bedroom a place that promotes quality sleep, and avoid things that may cause you to have poor sleep, such as alcohol, caffeine, smoking, or large meals. Talk to your health care provider if you have trouble falling asleep or staying asleep. This information is not intended to replace advice given to you by your health care provider. Make sure you discuss any questions you have with your health care provider. Document Revised: 12/28/2021 Document Reviewed: 12/28/2021 Elsevier Patient Education  2024 [employer].

## 2023-09-05 NOTE — Assessment & Plan Note (Signed)
Small subcutaneous infection noted but seems to be more of an ingrown hair.  Secondary to patient's other comorbidities though will treat with doxycycline to cover for MRSA.  Warned of potential side effects but optimistic and will make a difference.  No significant signs of any expansion at the moment.  Follow-up with me again for primary care provider in 10 days if not completely resolved.

## 2023-09-05 NOTE — Assessment & Plan Note (Signed)
For the fifth metatarsal, once weekly vitamin D, CAM Walker given.  Warned of stiffness of the ankle and range of motion exercises encouraged.  Follow-up again 4 weeks to further evaluate

## 2023-09-05 NOTE — Progress Notes (Unsigned)
Subjective:    Patient ID: [name] [name], female    DOB: 1961-05-09, 62 y.o.   MRN: [id number]      HPI Brenay is here for No chief complaint on file.    Neuropathy in fingers-   Sleep study-mild sleep apnea.  Would be improved with weight loss.  No associated hypoxia or change in heart rate.  Her main problem is fragmented sleep and insomnia. Medications and allergies reviewed with patient and updated if appropriate.  Current Outpatient Medications on File Prior to Visit  Medication Sig Dispense Refill   albuterol (VENTOLIN HFA) 108 (90 Base) MCG/ACT inhaler Inhale 2 puffs into the lungs every 6 (six) hours as needed for wheezing or shortness of breath. 8 g 5   ALPRAZolam (XANAX) 0.5 MG tablet Take 1 tablet (0.5 mg total) by mouth 2 (two) times daily as needed for anxiety or sleep. 30 tablet 0   Azelastine HCl 137 MCG/SPRAY SOLN PLACE 2 SPRAYS INTO BOTH NOSTRILS 2 (TWO) TIMES DAILY. USE IN EACH NOSTRIL AS DIRECTED (Patient taking differently: Place 2 sprays into the nose 2 (two) times daily.) 30 mL 2   b complex vitamins tablet Take 1 tablet by mouth daily.     budesonide-formoterol (SYMBICORT) 160-4.5 MCG/ACT inhaler Inhale 2 puffs into the lungs 2 (two) times daily. 10.2 g 5   Cholecalciferol (VITAMIN D3) 50 MCG (2000 UT) TABS Take 1 tablet by mouth daily.     doxycycline (VIBRA-TABS) 100 MG tablet Take 1 tablet (100 mg total) by mouth 2 (two) times daily. 20 tablet 0   escitalopram (LEXAPRO) 5 MG tablet TAKE 1 TABLET (5 MG TOTAL) BY MOUTH DAILY. 90 tablet 2   ferrous gluconate (FERGON) 324 MG tablet Take 1 tablet (324 mg total) by mouth 2 (two) times daily with a meal. (Patient taking differently: Take 324 mg by mouth 3 (three) times a week. PER PT TAKES MON/ WED/ FRIDAY TWICE DAILY) 180 tablet 3   fluconazole (DIFLUCAN) 100 MG tablet Take 1 tablet (100 mg total) by mouth daily. 5 tablet 0   fluticasone (FLONASE) 50 MCG/ACT nasal spray SHAKE LIQUID AND USE 2 SPRAYS IN  EACH NOSTRIL DAILY 48 mL 2   gabapentin (NEURONTIN) 300 MG capsule TAKE 1 CAPSULE BY MOUTH TWICE A DAY 180 capsule 0   hydrochlorothiazide (HYDRODIURIL) 25 MG tablet TAKE 1 TABLET (25 MG TOTAL) BY MOUTH DAILY. 90 tablet 1   ibuprofen (ADVIL) 800 MG tablet Take 1 tablet (800 mg total) by mouth every 8 (eight) hours as needed. 60 tablet 1   ipratropium (ATROVENT) 0.06 % nasal spray Place 2 sprays into both nostrils 4 (four) times daily as needed (nasal congestion/drainage). 15 mL 5   KLOR-CON M10 10 MEQ tablet TAKE 4 TABLETS BY MOUTH EVERY DAY 360 tablet 1   linaclotide (LINZESS) 145 MCG CAPS capsule TAKE 1 CAPSULE BY MOUTH EVERY DAY BEFORE BREAKFAST 90 capsule 1   loratadine (CLARITIN) 10 MG tablet Take 10 mg by mouth daily as needed for itching, rhinitis or allergies.     losartan (COZAAR) 50 MG tablet Take 1 tablet (50 mg total) by mouth daily. 90 tablet 3   metoprolol tartrate (LOPRESSOR) 50 MG tablet TAKE 1 TABLET BY MOUTH TWICE A DAY 180 tablet 2   pantoprazole (PROTONIX) 40 MG tablet TAKE 1 TABLET BY MOUTH EVERY DAY 90 tablet 1   predniSONE (DELTASONE) 10 MG tablet Take 4 tabs po qd x 3 days, then 3 tabs po qd x 3 days,  then 2 tabs po qd x 3 days, then 1 tab po qd x 3 days 30 tablet 0   Tart Cherry 1200 MG CAPS Take 1 capsule by mouth at bedtime.     TURMERIC PO Take 1 capsule by mouth daily.     valACYclovir (VALTREX) 500 MG tablet Take 500 mg by mouth 2 (two) times daily as needed.     Vitamin D, Ergocalciferol, (DRISDOL) 1.25 MG (50000 UNIT) CAPS capsule Take 1 capsule (50,000 Units total) by mouth every 7 (seven) days. 12 capsule 0   XARELTO 20 MG TABS tablet TAKE 1 TABLET(20 MG) BY MOUTH DAILY WITH SUPPER 90 tablet 1   No current facility-administered medications on file prior to visit.    Review of Systems     Objective:  There were no vitals filed for this visit. BP Readings from Last 3 Encounters:  09/04/23 122/76  07/23/23 110/76  07/10/23 137/83   Wt Readings from Last 3  Encounters:  09/04/23 208 lb (94.3 kg)  07/23/23 207 lb (93.9 kg)  07/10/23 207 lb (93.9 kg)   There is no height or weight on file to calculate BMI.    Physical Exam         Assessment & Plan:    See Problem List for Assessment and Plan of chronic medical problems.

## 2023-09-06 ENCOUNTER — Ambulatory Visit: Payer: BC Managed Care – PPO | Admitting: Internal Medicine

## 2023-09-06 VITALS — BP 122/84 | HR 75 | Temp 97.8°F | Ht 65.0 in | Wt 206.0 lb

## 2023-09-06 DIAGNOSIS — G620 Drug-induced polyneuropathy: Secondary | ICD-10-CM

## 2023-09-06 DIAGNOSIS — T451X5A Adverse effect of antineoplastic and immunosuppressive drugs, initial encounter: Secondary | ICD-10-CM

## 2023-09-06 DIAGNOSIS — G479 Sleep disorder, unspecified: Secondary | ICD-10-CM | POA: Diagnosis not present

## 2023-09-06 MED ORDER — GABAPENTIN 100 MG PO CAPS: 100.0000 mg | ORAL_CAPSULE | Freq: Every day | ORAL | 5 refills | Status: DC

## 2023-09-06 MED ORDER — DULOXETINE HCL 20 MG PO CPEP: 20.0000 mg | ORAL_CAPSULE | Freq: Every day | ORAL | 5 refills | Status: DC

## 2023-09-06 NOTE — Assessment & Plan Note (Signed)
Chronic Does have mild sleep apnea without hypoxia or bradycardia/tachycardia Working on weight loss Has bad sleep hygiene-discussed she needs to not watch TV in bed and needs a bedtime/wake time She will work on improving her sleep regimen

## 2023-09-06 NOTE — Assessment & Plan Note (Signed)
Chronic Controlled Continue gabapentin 300 mg in the morning Increase gabapentin 400 mg at night-can increase this further if needed Start Cymbalta 20 mg nightly

## 2023-09-09 ENCOUNTER — Encounter: Payer: Self-pay | Admitting: Internal Medicine

## 2023-10-02 NOTE — Progress Notes (Signed)
 [facility] [address] [place] [address] Phone: [phone] Subjective:   Delwyn Filippo, am serving as a scribe for Dr. Ronnell Coins.  I'm seeing this patient by the request  of:  Colene Dauphin, MD  CC: Right foot pain  [id number]  09/04/2023 For the fifth metatarsal, once weekly vitamin D , CAM Walker given.  Warned of stiffness of the ankle and range of motion exercises encouraged.  Follow-up again 4 weeks to further evaluate     Small subcutaneous infection noted but seems to be more of an ingrown hair.  Secondary to patient's other comorbidities though will treat with doxycycline  to cover for MRSA.  Warned of potential side effects but optimistic and will make a difference.  No significant signs of any expansion at the moment.  Follow-up with me again for primary care provider in 10 days if not completely resolved.   Updated 10/03/2023 Brooklynn Divens is a 63 y.o. female coming in with complaint of R foot pain. Patient states that she is no longer having pain.  Patient is feeling significantly better.  Has worn a regular shoe even with no significant discomfort.  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   Chronic constipation    Chronic fatigue 06/08/2016   Conjunctiva disorder 08/06/2018   Depression 09/03/2016   GAD (generalized anxiety disorder)    GERD    Herpes zoster without complication 12/11/2016   History of angioedema    ACE inhibitors   Hx of adenomatous colonic polyps 05/2014   Hyperlipidemia    Hypertension    Lactose  intolerance    Migraine headache    Multiple sclerosis, relapsing-remitting (HCC) 11/03/2015   neurologist--- dr Junette Older   Personal history of chemotherapy    PMB (postmenopausal bleeding)    Sleep apnea    Past Surgical History:  Procedure Laterality Date   BREAST BIOPSY Right 2016   x4   BREAST BIOPSY Left 03/09/2023   BREAST BIOPSY Left 03/09/2023   US  LT BREAST BX W LOC DEV 1ST LESION IMG BX SPEC US  GUIDE 03/09/2023 [facility]   COLONOSCOPY  2018   DILATATION & CURETTAGE/HYSTEROSCOPY WITH MYOSURE N/A 09/29/2022   Procedure: DILATATION & CURETTAGE/HYSTEROSCOPY WITH MYOSURE;  Surgeon: Luan Rumpf, MD;  Location:  S[facility];  Service: Gynecology;  Laterality: N/A;   MASTECTOMY Right 2016   PORT-A-CATH REMOVAL Left 05/13/2015   Procedure: REMOVAL PORT-A-CATH;  Surgeon: Enid Harry, MD;  Location: [facility] OR;  Service: General;  Laterality: Left;   PORTACATH PLACEMENT Left 11/06/2014   Procedure: INSERTION PORT-A-CATH;  Surgeon: Enid Harry, MD;  Location: [facility];  Service: General;  Laterality: Left;   SIMPLE MASTECTOMY WITH AXILLARY SENTINEL NODE BIOPSY Right 05/13/2015   Procedure: RIGHT TOTAL  MASTECTOMY WITH RIGHT  AXILLARY SENTINEL NODE BIOPSY;  Surgeon: Enid Harry, MD;  Location: [facility] OR;  Service: General;  Laterality: Right;   TUBAL LIGATION     yrs ago   Social History   Socioeconomic History   Marital status: Single    Spouse name: Not on file  Number of children: 2   Years of education: 12   Highest education level: Not on file  Occupational History   Occupation: Engineer, drilling: [employer]  Tobacco Use   Smoking status: Never   Smokeless tobacco: Never  Vaping Use   Vaping status: Never Used  Substance and Sexual Activity   Alcohol use: No    Alcohol/week: 0.0 standard drinks of alcohol   Drug use: Never   Sexual activity: Not on file  Other Topics Concern   Not on file  Social  History Narrative   Patient is a Administrator, 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    Patient is single and lives alone.    Patient is right handed.    Social Drivers of Health   Financial Resource Strain: Patient Declined (09/02/2023)   Overall Financial Resource Strain (CARDIA)    Difficulty of Paying Living Expenses: Patient declined  Food Insecurity: Patient Declined (09/02/2023)   Hunger Vital Sign    Worried About Running Out of Food in the Last Year: Patient declined    Ran Out of Food in the Last Year: Patient declined  Transportation Needs: Patient Declined (09/02/2023)   PRAPARE - Administrator, Civil Service (Medical): Patient declined    Lack of Transportation (Non-Medical): Patient declined  Physical Activity: Unknown (09/02/2023)   Exercise Vital Sign    Days of Exercise per Week: 0 days    Minutes of Exercise per Session: Not on file  Stress: No Stress Concern Present (09/02/2023)   [employer] of Occupational Health - Occupational Stress Questionnaire    Feeling of Stress : Not at all  Social Connections: Moderately Integrated (09/02/2023)   Social Connection and Isolation Panel [NHANES]    Frequency of Communication with Friends and Family: More than three times a week    Frequency of Social Gatherings with Friends and Family: More than three times a week    Attends Religious Services: More than 4 times per year    Active Member of [employer] or Organizations: Yes    Attends Engineer, structural: More than 4 times per year    Marital Status: Never married   Allergies  Allergen Reactions   Ace Inhibitors Anaphylaxis     Angioedema (tongue swelling)   Clarithromycin Anaphylaxis    Throat swells   Contrave  [Naltrexone -Bupropion  Hcl Er] Shortness Of Breath    Chest and sob   Levaquin  [Levofloxacin ] Other (See Comments)    Tendon pain - shoulder and calf   Family History  Problem Relation Age of  Onset   Coronary artery disease Mother    Heart disease Mother    Diabetes Mother    Hypertension Mother    Stroke Mother    Alcohol abuse Father    Colon cancer Sister    COPD Maternal Grandmother    Hypertension Other    Hyperlipidemia Other    Rectal cancer Neg Hx    Stomach cancer Neg Hx    Esophageal cancer Neg Hx    Breast cancer Neg Hx     Current Outpatient Medications (Endocrine & Metabolic):    predniSONE  (DELTASONE ) 10 MG tablet, Take 4 tabs po qd x 3 days, then 3 tabs po qd x 3 days, then 2 tabs po qd x 3 days, then 1 tab po qd x 3 days  Current Outpatient Medications (Cardiovascular):    hydrochlorothiazide  (HYDRODIURIL ) 25 MG tablet, TAKE 1 TABLET (  25 MG TOTAL) BY MOUTH DAILY.   losartan  (COZAAR ) 50 MG tablet, Take 1 tablet (50 mg total) by mouth daily.   metoprolol  tartrate (LOPRESSOR ) 50 MG tablet, TAKE 1 TABLET BY MOUTH TWICE A DAY  Current Outpatient Medications (Respiratory):    albuterol  (VENTOLIN  HFA) 108 (90 Base) MCG/ACT inhaler, Inhale 2 puffs into the lungs every 6 (six) hours as needed for wheezing or shortness of breath.   Azelastine  HCl 137 MCG/SPRAY SOLN, PLACE 2 SPRAYS INTO BOTH NOSTRILS 2 (TWO) TIMES DAILY. USE IN EACH NOSTRIL AS DIRECTED (Patient taking differently: Place 2 sprays into the nose 2 (two) times daily.)   budesonide -formoterol  (SYMBICORT ) 160-4.5 MCG/ACT inhaler, Inhale 2 puffs into the lungs 2 (two) times daily.   fluticasone  (FLONASE ) 50 MCG/ACT nasal spray, SHAKE LIQUID AND USE 2 SPRAYS IN EACH NOSTRIL DAILY   ipratropium (ATROVENT ) 0.06 % nasal spray, Place 2 sprays into both nostrils 4 (four) times daily as needed (nasal congestion/drainage).   loratadine  (CLARITIN ) 10 MG tablet, Take 10 mg by mouth daily as needed for itching, rhinitis or allergies.  Current Outpatient Medications (Analgesics):    ibuprofen  (ADVIL ) 800 MG tablet, Take 1 tablet (800 mg total) by mouth every 8 (eight) hours as needed.  Current Outpatient Medications  (Hematological):    ferrous gluconate  (FERGON) 324 MG tablet, Take 1 tablet (324 mg total) by mouth 2 (two) times daily with a meal. (Patient taking differently: Take 324 mg by mouth 3 (three) times a week. PER PT TAKES MON/ WED/ FRIDAY TWICE DAILY)   XARELTO  20 MG TABS tablet, TAKE 1 TABLET(20 MG) BY MOUTH DAILY WITH SUPPER  Current Outpatient Medications (Other):    ALPRAZolam  (XANAX ) 0.5 MG tablet, Take 1 tablet (0.5 mg total) by mouth 2 (two) times daily as needed for anxiety or sleep.   b complex vitamins tablet, Take 1 tablet by mouth daily.   Cholecalciferol (VITAMIN D3) 50 MCG (2000 UT) TABS, Take 1 tablet by mouth daily.   doxycycline  (VIBRA -TABS) 100 MG tablet, Take 1 tablet (100 mg total) by mouth 2 (two) times daily.   fluconazole  (DIFLUCAN ) 100 MG tablet, Take 1 tablet (100 mg total) by mouth daily.   gabapentin  (NEURONTIN ) 100 MG capsule, Take 1 capsule (100 mg total) by mouth at bedtime. In addition to 300 mg pill for total of 400 mg   gabapentin  (NEURONTIN ) 300 MG capsule, TAKE 1 CAPSULE BY MOUTH TWICE A DAY   KLOR-CON  M10 10 MEQ tablet, TAKE 4 TABLETS BY MOUTH EVERY DAY   linaclotide  (LINZESS ) 145 MCG CAPS capsule, TAKE 1 CAPSULE BY MOUTH EVERY DAY BEFORE BREAKFAST   pantoprazole  (PROTONIX ) 40 MG tablet, TAKE 1 TABLET BY MOUTH EVERY DAY   Tart Cherry 1200 MG CAPS, Take 1 capsule by mouth at bedtime.   TURMERIC PO, Take 1 capsule by mouth daily.   valACYclovir  (VALTREX ) 500 MG tablet, Take 500 mg by mouth 2 (two) times daily as needed.   Vitamin D , Ergocalciferol , (DRISDOL ) 1.25 MG (50000 UNIT) CAPS capsule, Take 1 capsule (50,000 Units total) by mouth every 7 (seven) days.   Reviewed prior external information including notes and imaging from  primary care provider As well as notes that were available from care everywhere and other healthcare systems.  Past medical history, social, surgical and family history all reviewed in electronic medical record.  No pertanent  information unless stated regarding to the chief complaint.   Review of Systems:  No headache, visual changes, nausea, vomiting, diarrhea, constipation, dizziness, abdominal pain,  skin rash, fevers, chills, night sweats, weight loss, swollen lymph nodes, body aches, joint swelling, chest pain, shortness of breath, mood changes. POSITIVE muscle aches  Objective  Blood pressure 108/76, pulse 86, height 5\' 5"  (1.651 m), last menstrual period 11/01/2012, SpO2 95%.   General: No apparent distress alert and oriented x3 mood and affect normal, dressed appropriately.  HEENT: Pupils equal, extraocular movements intact  Respiratory: Patient's speak in full sentences and does not appear short of breath  Cardiovascular: No lower extremity edema, non tender, no erythema  Right foot exam shows the patient does not have any swelling noted.  Nontender on exam.  Good range of motion of the ankle and foot.  Limited muscular skeletal ultrasound was performed and interpreted by Ronnell Coins, M   Limited ultrasound shows no cortical irregularity noted at this time.  Good callus formation of the fifth metatarsal proximally where we did see the injury previously.  No increase in Doppler flow. Impression: Interval improvement    Impression and Recommendations:    The above documentation has been reviewed and is accurate and complete Talin Feister M Deleon Passe, DO

## 2023-10-03 ENCOUNTER — Ambulatory Visit: Payer: 59 | Admitting: Family Medicine

## 2023-10-03 ENCOUNTER — Encounter: Payer: Self-pay | Admitting: Family Medicine

## 2023-10-03 ENCOUNTER — Other Ambulatory Visit: Payer: Self-pay

## 2023-10-03 VITALS — BP 108/76 | HR 86 | Ht 65.0 in

## 2023-10-03 DIAGNOSIS — M79671 Pain in right foot: Secondary | ICD-10-CM

## 2023-10-07 ENCOUNTER — Encounter: Payer: Self-pay | Admitting: Internal Medicine

## 2023-10-07 NOTE — Progress Notes (Unsigned)
Subjective:    Patient ID: [name] [name], female    DOB: 1960-09-23, 63 y.o.   MRN: [id number]      HPI [name] [name] is here for No chief complaint on file.    Neuropathy in fingers -   Did not tolerate cymbalta -     Medications and allergies reviewed with patient and updated if appropriate.  Current Outpatient Medications on File Prior to Visit  Medication Sig Dispense Refill   albuterol (VENTOLIN HFA) 108 (90 Base) MCG/ACT inhaler Inhale 2 puffs into the lungs every 6 (six) hours as needed for wheezing or shortness of breath. 8 g 5   ALPRAZolam (XANAX) 0.5 MG tablet Take 1 tablet (0.5 mg total) by mouth 2 (two) times daily as needed for anxiety or sleep. 30 tablet 0   Azelastine HCl 137 MCG/SPRAY SOLN PLACE 2 SPRAYS INTO BOTH NOSTRILS 2 (TWO) TIMES DAILY. USE IN EACH NOSTRIL AS DIRECTED (Patient taking differently: Place 2 sprays into the nose 2 (two) times daily.) 30 mL 2   b complex vitamins tablet Take 1 tablet by mouth daily.     budesonide-formoterol (SYMBICORT) 160-4.5 MCG/ACT inhaler Inhale 2 puffs into the lungs 2 (two) times daily. 10.2 g 5   Cholecalciferol (VITAMIN D3) 50 MCG (2000 UT) TABS Take 1 tablet by mouth daily.     doxycycline (VIBRA-TABS) 100 MG tablet Take 1 tablet (100 mg total) by mouth 2 (two) times daily. 20 tablet 0   ferrous gluconate (FERGON) 324 MG tablet Take 1 tablet (324 mg total) by mouth 2 (two) times daily with a meal. (Patient taking differently: Take 324 mg by mouth 3 (three) times a week. PER PT TAKES MON/ WED/ FRIDAY TWICE DAILY) 180 tablet 3   fluconazole (DIFLUCAN) 100 MG tablet Take 1 tablet (100 mg total) by mouth daily. 5 tablet 0   fluticasone (FLONASE) 50 MCG/ACT nasal spray SHAKE LIQUID AND USE 2 SPRAYS IN EACH NOSTRIL DAILY 48 mL 2   gabapentin (NEURONTIN) 100 MG capsule Take 1 capsule (100 mg total) by mouth at bedtime. In addition to 300 mg pill for total of 400 mg 30 capsule 5   gabapentin (NEURONTIN) 300 MG capsule TAKE 1  CAPSULE BY MOUTH TWICE A DAY 180 capsule 0   hydrochlorothiazide (HYDRODIURIL) 25 MG tablet TAKE 1 TABLET (25 MG TOTAL) BY MOUTH DAILY. 90 tablet 1   ibuprofen (ADVIL) 800 MG tablet Take 1 tablet (800 mg total) by mouth every 8 (eight) hours as needed. 60 tablet 1   ipratropium (ATROVENT) 0.06 % nasal spray Place 2 sprays into both nostrils 4 (four) times daily as needed (nasal congestion/drainage). 15 mL 5   KLOR-CON M10 10 MEQ tablet TAKE 4 TABLETS BY MOUTH EVERY DAY 360 tablet 1   linaclotide (LINZESS) 145 MCG CAPS capsule TAKE 1 CAPSULE BY MOUTH EVERY DAY BEFORE BREAKFAST 90 capsule 1   loratadine (CLARITIN) 10 MG tablet Take 10 mg by mouth daily as needed for itching, rhinitis or allergies.     losartan (COZAAR) 50 MG tablet Take 1 tablet (50 mg total) by mouth daily. 90 tablet 3   metoprolol tartrate (LOPRESSOR) 50 MG tablet TAKE 1 TABLET BY MOUTH TWICE A DAY 180 tablet 2   pantoprazole (PROTONIX) 40 MG tablet TAKE 1 TABLET BY MOUTH EVERY DAY 90 tablet 1   predniSONE (DELTASONE) 10 MG tablet Take 4 tabs po qd x 3 days, then 3 tabs po qd x 3 days, then 2 tabs po qd x 3  days, then 1 tab po qd x 3 days 30 tablet 0   Tart Cherry 1200 MG CAPS Take 1 capsule by mouth at bedtime.     TURMERIC PO Take 1 capsule by mouth daily.     valACYclovir (VALTREX) 500 MG tablet Take 500 mg by mouth 2 (two) times daily as needed.     Vitamin D, Ergocalciferol, (DRISDOL) 1.25 MG (50000 UNIT) CAPS capsule Take 1 capsule (50,000 Units total) by mouth every 7 (seven) days. 12 capsule 0   XARELTO 20 MG TABS tablet TAKE 1 TABLET(20 MG) BY MOUTH DAILY WITH SUPPER 90 tablet 1   No current facility-administered medications on file prior to visit.    Review of Systems     Objective:  There were no vitals filed for this visit. BP Readings from Last 3 Encounters:  10/03/23 108/76  09/06/23 122/84  09/04/23 122/76   Wt Readings from Last 3 Encounters:  09/06/23 206 lb (93.4 kg)  09/04/23 208 lb (94.3 kg)   07/23/23 207 lb (93.9 kg)   There is no height or weight on file to calculate BMI.    Physical Exam         Assessment & Plan:    See Problem List for Assessment and Plan of chronic medical problems.

## 2023-10-07 NOTE — Patient Instructions (Incomplete)
      Blood work was ordered.       Medications changes include :   None    A referral was ordered and someone will call you to schedule an appointment.     No follow-ups on file.

## 2023-10-08 ENCOUNTER — Ambulatory Visit (INDEPENDENT_AMBULATORY_CARE_PROVIDER_SITE_OTHER): Payer: 59 | Admitting: Internal Medicine

## 2023-10-08 VITALS — BP 122/70 | HR 70 | Temp 97.6°F | Ht 65.0 in | Wt 208.0 lb

## 2023-10-08 DIAGNOSIS — J029 Acute pharyngitis, unspecified: Secondary | ICD-10-CM | POA: Diagnosis not present

## 2023-10-08 DIAGNOSIS — I10 Essential (primary) hypertension: Secondary | ICD-10-CM | POA: Diagnosis not present

## 2023-10-08 DIAGNOSIS — G620 Drug-induced polyneuropathy: Secondary | ICD-10-CM | POA: Diagnosis not present

## 2023-10-08 DIAGNOSIS — T451X5A Adverse effect of antineoplastic and immunosuppressive drugs, initial encounter: Secondary | ICD-10-CM

## 2023-10-08 NOTE — Assessment & Plan Note (Signed)
Acute Started last night No other cold symptoms Just just it could be the beginning of a cold versus other Exam is normal Symptoms are mild Monitor, symptomatic treatment She will call me if her symptoms progress or worsen

## 2023-10-08 NOTE — Assessment & Plan Note (Addendum)
Chronic Has neuropathy in her feet and hands The neuropathy in her feet has remained stable The neuropathy in her hands is progressively getting worse Was taking gabapentin 300 mg nightly and we increased that to 500 mg nightly which she is unsure if it is helping more or not Did not tolerate duloxetine My concern is that the neuropathy seems to be getting worse and is this still neuropathy related to the chemotherapy orders or some other cause-will refer to neurology for further evaluation There may be other medications neurology can offer as well to help treat the neuropathy Continue gabapentin 500 mg nightly

## 2023-10-08 NOTE — Assessment & Plan Note (Signed)
Chronic ?Blood pressure well controlled ?Continue metoprolol 50 mg twice daily, losartan 50 mg daily, HCTZ 25 mg daily ?

## 2023-10-17 ENCOUNTER — Other Ambulatory Visit: Payer: Self-pay | Admitting: Internal Medicine

## 2023-10-17 DIAGNOSIS — Z1231 Encounter for screening mammogram for malignant neoplasm of breast: Secondary | ICD-10-CM

## 2023-10-18 NOTE — Progress Notes (Signed)
[name] [address] [place] [address] Phone: [phone] Subjective:   Bruce Donath, am serving as a scribe for Dr. Antoine Primas.  I'm seeing this patient by the request  of:  Pincus Sanes, MD  CC: Right ankle pain follow-up  [id number]  Orpha Dain is a 63 y.o. female coming in with complaint of R foot pain. Last seen on 10/03/2023. Callus formation noted on [place]. Patient states that pain is not getting any better.       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   Chronic constipation    Chronic fatigue 06/08/2016   Conjunctiva disorder 08/06/2018   Depression 09/03/2016   GAD (generalized anxiety disorder)    GERD    Herpes zoster without complication 12/11/2016   History of angioedema    ACE inhibitors   Hx of adenomatous colonic polyps 05/2014   Hyperlipidemia    Hypertension    Lactose intolerance    Migraine headache    Multiple sclerosis, relapsing-remitting (HCC) 11/03/2015   neurologist--- dr Wilnette Kales   Personal history of chemotherapy    PMB (postmenopausal bleeding)    Sleep apnea    Past Surgical History:  Procedure Laterality Date   BREAST BIOPSY Right 2016   x4   BREAST BIOPSY Left 03/09/2023   BREAST BIOPSY Left 03/09/2023   [place] LT BREAST BX W LOC DEV 1ST LESION IMG BX SPEC US GUIDE 03/09/2023 [facility]   COLONOSCOPY  2018   DILATATION & CURETTAGE/HYSTEROSCOPY WITH MYOSURE N/A 09/29/2022   Procedure: DILATATION & CURETTAGE/HYSTEROSCOPY WITH MYOSURE;  Surgeon: Marlow Baars, MD;  Location: [facility];  Service: Gynecology;   Laterality: N/A;   MASTECTOMY Right 2016   PORT-A-CATH REMOVAL Left 05/13/2015   Procedure: REMOVAL PORT-A-CATH;  Surgeon: Emelia Loron, MD;  Location: [facility] OR;  Service: General;  Laterality: Left;   PORTACATH PLACEMENT Left 11/06/2014   Procedure: INSERTION PORT-A-CATH;  Surgeon: Emelia Loron, MD;  Location: [facility];  Service: General;  Laterality: Left;   SIMPLE MASTECTOMY WITH AXILLARY SENTINEL NODE BIOPSY Right 05/13/2015   Procedure: RIGHT TOTAL  MASTECTOMY WITH RIGHT  AXILLARY SENTINEL NODE BIOPSY;  Surgeon: Emelia Loron, MD;  Location: [facility] OR;  Service: General;  Laterality: Right;   TUBAL LIGATION     yrs ago   Social History   Socioeconomic History   Marital status: Single    Spouse name: Not on file   Number of children: 2   Years of education: 12   Highest education level: Not on file  Occupational History   Occupation: Engineer, drilling: [employer]  Tobacco Use   Smoking status: Never   Smokeless tobacco: Never  Vaping Use   Vaping status: Never Used  Substance and Sexual Activity   Alcohol use: No    Alcohol/week: 0.0 standard drinks of alcohol   Drug use: Never   Sexual activity: Not on file  Other Topics Concern   Not on file  Social History Narrative   Patient is a Administrator, 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    Patient  is single and lives alone.    Patient is right handed.    Social Drivers of Health   Financial Resource Strain: Patient Declined (09/02/2023)   Overall Financial Resource Strain (CARDIA)    Difficulty of Paying Living Expenses: Patient declined  Food Insecurity: Patient Declined (09/02/2023)   Hunger Vital Sign    Worried About Running Out of Food in the Last Year: Patient declined    Ran Out of Food in the Last Year: Patient declined  Transportation Needs: Patient Declined (09/02/2023)   PRAPARE - Administrator, Civil Service  (Medical): Patient declined    Lack of Transportation (Non-Medical): Patient declined  Physical Activity: Unknown (09/02/2023)   Exercise Vital Sign    Days of Exercise per Week: 0 days    Minutes of Exercise per Session: Not on file  Stress: No Stress Concern Present (09/02/2023)   [employer] of Occupational Health - Occupational Stress Questionnaire    Feeling of Stress : Not at all  Social Connections: Moderately Integrated (09/02/2023)   Social Connection and Isolation Panel [NHANES]    Frequency of Communication with Friends and Family: More than three times a week    Frequency of Social Gatherings with Friends and Family: More than three times a week    Attends Religious Services: More than 4 times per year    Active Member of [employer] or Organizations: Yes    Attends Engineer, structural: More than 4 times per year    Marital Status: Never married   Allergies  Allergen Reactions   Ace Inhibitors Anaphylaxis     Angioedema (tongue swelling)   Clarithromycin Anaphylaxis    Throat swells   Contrave [Naltrexone-Bupropion Hcl Er] Shortness Of Breath    Chest and sob   Levaquin [Levofloxacin] Other (See Comments)    Tendon pain - shoulder and calf   Duloxetine Other (See Comments)    Did not feel herself when taking it   Family History  Problem Relation Age of Onset   Coronary artery disease Mother    Heart disease Mother    Diabetes Mother    Hypertension Mother    Stroke Mother    Alcohol abuse Father    Colon cancer Sister    COPD Maternal Grandmother    Hypertension Other    Hyperlipidemia Other    Rectal cancer Neg Hx    Stomach cancer Neg Hx    Esophageal cancer Neg Hx    Breast cancer Neg Hx     Current Outpatient Medications (Endocrine & Metabolic):    predniSONE (DELTASONE) 10 MG tablet, Take 4 tabs po qd x 3 days, then 3 tabs po qd x 3 days, then 2 tabs po qd x 3 days, then 1 tab po qd x 3 days  Current Outpatient Medications  (Cardiovascular):    hydrochlorothiazide (HYDRODIURIL) 25 MG tablet, TAKE 1 TABLET (25 MG TOTAL) BY MOUTH DAILY.   losartan (COZAAR) 50 MG tablet, Take 1 tablet (50 mg total) by mouth daily.   metoprolol tartrate (LOPRESSOR) 50 MG tablet, TAKE 1 TABLET BY MOUTH TWICE A DAY  Current Outpatient Medications (Respiratory):    albuterol (VENTOLIN HFA) 108 (90 Base) MCG/ACT inhaler, Inhale 2 puffs into the lungs every 6 (six) hours as needed for wheezing or shortness of breath.   Azelastine HCl 137 MCG/SPRAY SOLN, PLACE 2 SPRAYS INTO BOTH NOSTRILS 2 (TWO) TIMES DAILY. USE IN EACH NOSTRIL AS DIRECTED (Patient taking differently: Place 2 sprays into the nose  2 (two) times daily.)   budesonide-formoterol (SYMBICORT) 160-4.5 MCG/ACT inhaler, Inhale 2 puffs into the lungs 2 (two) times daily.   fluticasone (FLONASE) 50 MCG/ACT nasal spray, SHAKE LIQUID AND USE 2 SPRAYS IN EACH NOSTRIL DAILY   ipratropium (ATROVENT) 0.06 % nasal spray, Place 2 sprays into both nostrils 4 (four) times daily as needed (nasal congestion/drainage).   loratadine (CLARITIN) 10 MG tablet, Take 10 mg by mouth daily as needed for itching, rhinitis or allergies.  Current Outpatient Medications (Analgesics):    ibuprofen (ADVIL) 800 MG tablet, Take 1 tablet (800 mg total) by mouth every 8 (eight) hours as needed.  Current Outpatient Medications (Hematological):    ferrous gluconate (FERGON) 324 MG tablet, Take 1 tablet (324 mg total) by mouth 2 (two) times daily with a meal. (Patient taking differently: Take 324 mg by mouth 3 (three) times a week. PER PT TAKES MON/ WED/ FRIDAY TWICE DAILY)   XARELTO 20 MG TABS tablet, TAKE 1 TABLET(20 MG) BY MOUTH DAILY WITH SUPPER  Current Outpatient Medications (Other):    ALPRAZolam (XANAX) 0.5 MG tablet, Take 1 tablet (0.5 mg total) by mouth 2 (two) times daily as needed for anxiety or sleep.   b complex vitamins tablet, Take 1 tablet by mouth daily.   Cholecalciferol (VITAMIN D3) 50 MCG (2000  UT) TABS, Take 1 tablet by mouth daily.   doxycycline (VIBRA-TABS) 100 MG tablet, Take 1 tablet (100 mg total) by mouth 2 (two) times daily.   fluconazole (DIFLUCAN) 100 MG tablet, Take 1 tablet (100 mg total) by mouth daily.   gabapentin (NEURONTIN) 100 MG capsule, Take 1 capsule (100 mg total) by mouth at bedtime. In addition to 300 mg pill for total of 400 mg   gabapentin (NEURONTIN) 300 MG capsule, TAKE 1 CAPSULE BY MOUTH TWICE A DAY   KLOR-CON M10 10 MEQ tablet, TAKE 4 TABLETS BY MOUTH EVERY DAY   linaclotide (LINZESS) 145 MCG CAPS capsule, TAKE 1 CAPSULE BY MOUTH EVERY DAY BEFORE BREAKFAST   pantoprazole (PROTONIX) 40 MG tablet, TAKE 1 TABLET BY MOUTH EVERY DAY   Tart Cherry 1200 MG CAPS, Take 1 capsule by mouth at bedtime.   TURMERIC PO, Take 1 capsule by mouth daily.   valACYclovir (VALTREX) 500 MG tablet, Take 500 mg by mouth 2 (two) times daily as needed.   Vitamin D, Ergocalciferol, (DRISDOL) 1.25 MG (50000 UNIT) CAPS capsule, Take 1 capsule (50,000 Units total) by mouth every 7 (seven) days.   Reviewed prior external information including notes and imaging from  primary care provider As well as notes that were available from care everywhere and other healthcare systems.  Past medical history, social, surgical and family history all reviewed in electronic medical record.  No pertanent information unless stated regarding to the chief complaint.   Review of Systems:  No headache, visual changes, nausea, vomiting, diarrhea, constipation, dizziness, abdominal pain, skin rash, fevers, chills, night sweats, weight loss, swollen lymph nodes, body aches, joint swelling, chest pain, shortness of breath, mood changes. POSITIVE muscle aches  Objective  Blood pressure 112/78, pulse 80, height 5\' 5"  (1.651 m), weight 209 lb (94.8 kg), last menstrual period 11/01/2012, SpO2 98%.   General: No apparent distress alert and oriented x3 mood and affect normal, dressed appropriately.  HEENT: Pupils  equal, extraocular movements intact  Respiratory: Patient's speak in full sentences and does not appear short of breath  Cardiovascular: No lower extremity edema, non tender, no erythema  Right ankle does have some new acute  swelling noted more over the posterior tibialis tendons.  Patient is not as severe over the balloon at this moment.  Limited muscular skeletal ultrasound was performed and interpreted by Antoine Primas, M  Limited ultrasound shows some hypoechoic changes within the tendon sheath which is different than previous exam.  No significant cortical irregularity noted today.  Seems to still have the callus formation noted that was previously seen 3 weeks ago.  Mild thickening noted of this. 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

## 2023-10-23 ENCOUNTER — Ambulatory Visit (INDEPENDENT_AMBULATORY_CARE_PROVIDER_SITE_OTHER): Payer: 59

## 2023-10-23 ENCOUNTER — Encounter: Payer: Self-pay | Admitting: Family Medicine

## 2023-10-23 ENCOUNTER — Ambulatory Visit (INDEPENDENT_AMBULATORY_CARE_PROVIDER_SITE_OTHER): Payer: 59 | Admitting: Family Medicine

## 2023-10-23 ENCOUNTER — Other Ambulatory Visit: Payer: Self-pay

## 2023-10-23 VITALS — BP 112/78 | HR 80 | Ht 65.0 in | Wt 209.0 lb

## 2023-10-23 DIAGNOSIS — M6788 Other specified disorders of synovium and tendon, other site: Secondary | ICD-10-CM | POA: Insufficient documentation

## 2023-10-23 DIAGNOSIS — M79671 Pain in right foot: Secondary | ICD-10-CM

## 2023-10-23 NOTE — Assessment & Plan Note (Signed)
 Likely secondary to the compensating for the foot pain that she was having previously.  X-rays ordered today, exercises given, Aircast given, discussed icing regimen.  We discussed but seated work only for the next 4 weeks.  Discussed icing regimen and home exercises.  Follow-up again in 6 to 8 weeks otherwise.

## 2023-10-23 NOTE — Patient Instructions (Signed)
You have 14 days to return or exchange your brace Call [phone], then return the brace to our office Do prescribed exercises at least 3x a week See you again in 4 weeks

## 2023-10-30 ENCOUNTER — Other Ambulatory Visit (HOSPITAL_BASED_OUTPATIENT_CLINIC_OR_DEPARTMENT_OTHER): Payer: Self-pay

## 2023-11-04 ENCOUNTER — Other Ambulatory Visit: Payer: Self-pay | Admitting: Internal Medicine

## 2023-11-12 NOTE — Progress Notes (Deleted)
 [name] [address] [place] [address] Phone: [phone] Subjective:    I'm seeing this patient by the request  of:  Pincus Sanes, MD  CC:   [id number]  10/23/2023 Likely secondary to the compensating for the foot pain that she was having previously.  X-rays ordered today, exercises given, Aircast given, discussed icing regimen.  We discussed but seated work only for the next 4 weeks.  Discussed icing regimen and home exercises.  Follow-up again in 6 to 8 weeks otherwise      Update 11/20/3033 [name] [name] is a 63 y.o. female coming in with complaint of R ankle pain. 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   Chronic constipation    Chronic fatigue 06/08/2016   Conjunctiva disorder 08/06/2018   Depression 09/03/2016   GAD (generalized anxiety disorder)    GERD    Herpes zoster without complication 12/11/2016   History of angioedema    ACE inhibitors   Hx of adenomatous colonic polyps 05/2014   Hyperlipidemia    Hypertension    Lactose intolerance    Migraine headache    Multiple sclerosis, relapsing-remitting (HCC) 11/03/2015   neurologist--- dr Wilnette Kales   Personal history of chemotherapy    PMB (postmenopausal bleeding)    Sleep apnea    Past Surgical History:  Procedure Laterality Date   BREAST BIOPSY Right 2016   x4   BREAST BIOPSY Left 03/09/2023   BREAST BIOPSY Left 03/09/2023   [place] LT BREAST BX W LOC DEV 1ST LESION IMG BX SPEC US GUIDE 03/09/2023 [facility]   COLONOSCOPY  2018   DILATATION & CURETTAGE/HYSTEROSCOPY WITH MYOSURE N/A  09/29/2022   Procedure: DILATATION & CURETTAGE/HYSTEROSCOPY WITH MYOSURE;  Surgeon: Marlow Baars, MD;  Location: [facility];  Service: Gynecology;  Laterality: N/A;   MASTECTOMY Right 2016   PORT-A-CATH REMOVAL Left 05/13/2015   Procedure: REMOVAL PORT-A-CATH;  Surgeon: Emelia Loron, MD;  Location: [facility];  Service: General;  Laterality: Left;   PORTACATH PLACEMENT Left 11/06/2014   Procedure: INSERTION PORT-A-CATH;  Surgeon: Emelia Loron, MD;  Location: [facility];  Service: General;  Laterality: Left;   SIMPLE MASTECTOMY WITH AXILLARY SENTINEL NODE BIOPSY Right 05/13/2015   Procedure: RIGHT TOTAL  MASTECTOMY WITH RIGHT  AXILLARY SENTINEL NODE BIOPSY;  Surgeon: Emelia Loron, MD;  Location: [facility] OR;  Service: General;  Laterality: Right;   TUBAL LIGATION     yrs ago   Social History   Socioeconomic History   Marital status: Single    Spouse name: Not on file   Number of children: 2   Years of education: 12   Highest education level: Not on file  Occupational History   Occupation: Engineer, drilling: [employer]  Tobacco Use   Smoking status: Never   Smokeless tobacco: Never  Vaping Use   Vaping status: Never Used  Substance and Sexual Activity   Alcohol use: No    Alcohol/week: 0.0 standard drinks of alcohol   Drug use: Never   Sexual activity: Not on file  Other Topics Concern  Not on file  Social History Narrative   Patient is a Administrator, 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    Patient is single and lives alone.    Patient is right handed.    Social Drivers of Health   Financial Resource Strain: Patient Declined (09/02/2023)   Overall Financial Resource Strain (CARDIA)    Difficulty of Paying Living Expenses: Patient declined  Food Insecurity: Patient Declined (09/02/2023)   Hunger Vital Sign    Worried About Running Out of Food in the Last Year: Patient declined     Ran Out of Food in the Last Year: Patient declined  Transportation Needs: Patient Declined (09/02/2023)   PRAPARE - Administrator, Civil Service (Medical): Patient declined    Lack of Transportation (Non-Medical): Patient declined  Physical Activity: Unknown (09/02/2023)   Exercise Vital Sign    Days of Exercise per Week: 0 days    Minutes of Exercise per Session: Not on file  Stress: No Stress Concern Present (09/02/2023)   [employer] of Occupational Health - Occupational Stress Questionnaire    Feeling of Stress : Not at all  Social Connections: Moderately Integrated (09/02/2023)   Social Connection and Isolation Panel [NHANES]    Frequency of Communication with Friends and Family: More than three times a week    Frequency of Social Gatherings with Friends and Family: More than three times a week    Attends Religious Services: More than 4 times per year    Active Member of [employer] or Organizations: Yes    Attends Engineer, structural: More than 4 times per year    Marital Status: Never married   Allergies  Allergen Reactions   Ace Inhibitors Anaphylaxis     Angioedema (tongue swelling)   Clarithromycin Anaphylaxis    Throat swells   Contrave [Naltrexone-Bupropion Hcl Er] Shortness Of Breath    Chest and sob   Levaquin [Levofloxacin] Other (See Comments)    Tendon pain - shoulder and calf   Duloxetine Other (See Comments)    Did not feel herself when taking it   Family History  Problem Relation Age of Onset   Coronary artery disease Mother    Heart disease Mother    Diabetes Mother    Hypertension Mother    Stroke Mother    Alcohol abuse Father    Colon cancer Sister    COPD Maternal Grandmother    Hypertension Other    Hyperlipidemia Other    Rectal cancer Neg Hx    Stomach cancer Neg Hx    Esophageal cancer Neg Hx    Breast cancer Neg Hx     Current Outpatient Medications (Endocrine & Metabolic):    predniSONE (DELTASONE) 10 MG tablet,  Take 4 tabs po qd x 3 days, then 3 tabs po qd x 3 days, then 2 tabs po qd x 3 days, then 1 tab po qd x 3 days  Current Outpatient Medications (Cardiovascular):    hydrochlorothiazide (HYDRODIURIL) 25 MG tablet, TAKE 1 TABLET (25 MG TOTAL) BY MOUTH DAILY.   losartan (COZAAR) 50 MG tablet, TAKE 1 TABLET BY MOUTH EVERY DAY   metoprolol tartrate (LOPRESSOR) 50 MG tablet, TAKE 1 TABLET BY MOUTH TWICE A DAY  Current Outpatient Medications (Respiratory):    albuterol (VENTOLIN HFA) 108 (90 Base) MCG/ACT inhaler, Inhale 2 puffs into the lungs every 6 (six) hours as needed for wheezing or shortness of breath.   Azelastine HCl 137  MCG/SPRAY SOLN, PLACE 2 SPRAYS INTO BOTH NOSTRILS 2 (TWO) TIMES DAILY. USE IN EACH NOSTRIL AS DIRECTED (Patient taking differently: Place 2 sprays into the nose 2 (two) times daily.)   budesonide-formoterol (SYMBICORT) 160-4.5 MCG/ACT inhaler, Inhale 2 puffs into the lungs 2 (two) times daily.   fluticasone (FLONASE) 50 MCG/ACT nasal spray, SHAKE LIQUID AND USE 2 SPRAYS IN EACH NOSTRIL DAILY   ipratropium (ATROVENT) 0.06 % nasal spray, Place 2 sprays into both nostrils 4 (four) times daily as needed (nasal congestion/drainage).   loratadine (CLARITIN) 10 MG tablet, Take 10 mg by mouth daily as needed for itching, rhinitis or allergies.  Current Outpatient Medications (Analgesics):    ibuprofen (ADVIL) 800 MG tablet, Take 1 tablet (800 mg total) by mouth every 8 (eight) hours as needed.  Current Outpatient Medications (Hematological):    ferrous gluconate (FERGON) 324 MG tablet, Take 1 tablet (324 mg total) by mouth 2 (two) times daily with a meal. (Patient taking differently: Take 324 mg by mouth 3 (three) times a week. PER PT TAKES MON/ WED/ FRIDAY TWICE DAILY)   XARELTO 20 MG TABS tablet, TAKE 1 TABLET(20 MG) BY MOUTH DAILY WITH SUPPER  Current Outpatient Medications (Other):    ALPRAZolam (XANAX) 0.5 MG tablet, Take 1 tablet (0.5 mg total) by mouth 2 (two) times daily as  needed for anxiety or sleep.   b complex vitamins tablet, Take 1 tablet by mouth daily.   Cholecalciferol (VITAMIN D3) 50 MCG (2000 UT) TABS, Take 1 tablet by mouth daily.   doxycycline (VIBRA-TABS) 100 MG tablet, Take 1 tablet (100 mg total) by mouth 2 (two) times daily.   fluconazole (DIFLUCAN) 100 MG tablet, Take 1 tablet (100 mg total) by mouth daily.   gabapentin (NEURONTIN) 100 MG capsule, Take 1 capsule (100 mg total) by mouth at bedtime. In addition to 300 mg pill for total of 400 mg   gabapentin (NEURONTIN) 300 MG capsule, TAKE 1 CAPSULE BY MOUTH TWICE A DAY   KLOR-CON M10 10 MEQ tablet, TAKE 4 TABLETS BY MOUTH EVERY DAY   linaclotide (LINZESS) 145 MCG CAPS capsule, TAKE 1 CAPSULE BY MOUTH EVERY DAY BEFORE BREAKFAST   pantoprazole (PROTONIX) 40 MG tablet, TAKE 1 TABLET BY MOUTH EVERY DAY   Tart Cherry 1200 MG CAPS, Take 1 capsule by mouth at bedtime.   TURMERIC PO, Take 1 capsule by mouth daily.   valACYclovir (VALTREX) 500 MG tablet, Take 500 mg by mouth 2 (two) times daily as needed.   Vitamin D, Ergocalciferol, (DRISDOL) 1.25 MG (50000 UNIT) CAPS capsule, Take 1 capsule (50,000 Units total) by mouth every 7 (seven) days.   Reviewed prior external information including notes and imaging from  primary care provider As well as notes that were available from care everywhere and other healthcare systems.  Past medical history, social, surgical and family history all reviewed in electronic medical record.  No pertanent information unless stated regarding to the chief complaint.   Review of Systems:  No headache, visual changes, nausea, vomiting, diarrhea, constipation, dizziness, abdominal pain, skin rash, fevers, chills, night sweats, weight loss, swollen lymph nodes, body aches, joint swelling, chest pain, shortness of breath, mood changes. POSITIVE muscle aches  Objective  Last menstrual period 11/01/2012.   General: No apparent distress alert and oriented x3 mood and affect  normal, dressed appropriately.  HEENT: Pupils equal, extraocular movements intact  Respiratory: Patient's speak in full sentences and does not appear short of breath  Cardiovascular: No lower extremity edema, non tender,  no erythema      Impression and Recommendations:

## 2023-11-13 ENCOUNTER — Ambulatory Visit (INDEPENDENT_AMBULATORY_CARE_PROVIDER_SITE_OTHER): Payer: 59 | Admitting: Neurology

## 2023-11-13 VITALS — BP 129/80 | HR 88 | Ht 63.0 in | Wt 208.8 lb

## 2023-11-13 DIAGNOSIS — M79641 Pain in right hand: Secondary | ICD-10-CM | POA: Diagnosis not present

## 2023-11-13 DIAGNOSIS — G620 Drug-induced polyneuropathy: Secondary | ICD-10-CM

## 2023-11-13 DIAGNOSIS — M79642 Pain in left hand: Secondary | ICD-10-CM

## 2023-11-13 DIAGNOSIS — M542 Cervicalgia: Secondary | ICD-10-CM | POA: Diagnosis not present

## 2023-11-13 DIAGNOSIS — R600 Localized edema: Secondary | ICD-10-CM

## 2023-11-13 MED ORDER — PREGABALIN 50 MG PO CAPS: 50.0000 mg | ORAL_CAPSULE | Freq: Three times a day (TID) | ORAL | 2 refills | Status: DC

## 2023-11-13 NOTE — Progress Notes (Signed)
 Provider:  Melvyn Novas, MD  Primary Care Physician:  Pincus Sanes, MD [address] [place] [address]     Referring Provider: Pincus Sanes, Md [address] [place],  [place] [address]          Chief Complaint according to patient   Patient presents with:                HISTORY OF PRESENT ILLNESS:  [name] [name] is a 63 y.o. female patient who is here for revisit 11/13/2023 for " finger- nerves ".   Chief concern according to patient :  its  affecting both hands, but the right hand is more noticeable. And the middle and ring finger are most numb.  She takes gabapentin, taking 300 mg in AM 500 mg at bedtime.  Since 2016 .    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  Chemotherapy was in 2016 for breast cancer.  No radiation.  Right breast.  Atypical atrial flutter , paroxysmally.  My first encounter with this patient was for OSA, and she had such mild apnea that we felt CPAP was optional.       CLINICAL INFORMATION/HISTORY: atypical atrial flutter, SVT,  Insomnia. Chronic, with sleep hygiene challenges- she goes to bed not to sleep but to watch TV, there is nothing else to do.  Retirement has been not so good for her, she likes interaction and the being on her feet . She has explored returning to work part time.   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 The patient has not used the recommended CPAP.  07-10-2023: [name] [name] is a 63 y.o. female who was seen by Dr. Vickey Huger on 12/11/2022 for complaints of chronic insomnia.  Previously followed for history of MS with prior f/u visit for MS with Darrol Angel, NP in 2019 on Copaxone. She then returned to see Dr. Vickey Huger in 11/2018 for complaints of insomnia as well as neuropathy post chemo. She did have HST in 03/2019 (ordered by Dr. Mayford Knife) which showed mild sleep apnea with initiation of CPAP but patient  unable to tolerate therefore discontinued. Per Dr. 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 Recommended consideration of repeat HST after at least 25lb weight loss to see if apnea still present. Advised to continue to follow with oncology for neuropathy management as neuropathy controlled on gabapentin        Review of Systems: Out of a complete 14 system review, the patient complains of only the following symptoms, and all other reviewed systems are negative.:   There was preserved sensation of fine touch and vibration from tuning fork in all fingers and tips.  She describes at night being woken by a pain that starts in the ring and middle fingers and palms, radiates to the elbow but not above, stretching the arms and moving helps.   Hands are swollen.     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    Patient has a Barista.    Patient is single and lives alone.    Patient is right handed.    Social Drivers of Health   Financial Resource Strain: Patient Declined (09/02/2023)   Overall Financial Resource Strain (CARDIA)    Difficulty of Paying Living Expenses: Patient declined  Food Insecurity: Patient Declined (09/02/2023)   Hunger Vital Sign    Worried About Running Out of Food in the Last Year:  Patient declined    Ran Out of Food in the Last Year: Patient declined  Transportation Needs: Patient Declined (09/02/2023)   PRAPARE - Administrator, Civil Service (Medical): Patient declined    Lack of Transportation (Non-Medical): Patient declined  Physical Activity: Unknown (09/02/2023)   Exercise Vital Sign    Days of Exercise per Week: 0 days    Minutes of Exercise per Session: Not on file  Stress: No Stress Concern Present (09/02/2023)   [employer] of Occupational Health - Occupational Stress Questionnaire    Feeling of Stress : Not at all  Social Connections: Moderately Integrated (09/02/2023)   Social Connection and Isolation Panel [NHANES]    Frequency of Communication with Friends and Family: More than three times a week    Frequency of Social Gatherings with Friends and Family: More than three times a week    Attends Religious Services: More than 4 times per year    Active Member of Clubs or Organizations: Yes    Attends Engineer, structural: More than 4 times per year    Marital Status: Never married    Family History  Problem Relation Age of Onset   Coronary artery disease Mother    Heart disease Mother    Diabetes Mother    Hypertension Mother    Stroke Mother    Alcohol abuse Father    Colon cancer Sister    COPD Maternal Grandmother    Hypertension Other    Hyperlipidemia Other    Rectal cancer Neg Hx    Stomach cancer Neg Hx    Esophageal cancer Neg Hx    Breast cancer Neg Hx     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   Chronic constipation    Chronic fatigue 06/08/2016    Conjunctiva disorder 08/06/2018   Depression 09/03/2016   GAD (generalized anxiety disorder)    GERD    Herpes zoster without complication 12/11/2016   History of angioedema    ACE inhibitors   Hx of adenomatous colonic polyps 05/2014   Hyperlipidemia    Hypertension    Lactose intolerance    Migraine headache    Multiple sclerosis, relapsing-remitting (HCC) 11/03/2015   neurologist--- dr Wilnette Kales   Personal history of chemotherapy    PMB (postmenopausal bleeding)  Sleep apnea     Past Surgical History:  Procedure Laterality Date   BREAST BIOPSY Right 2016   x4   BREAST BIOPSY Left 03/09/2023   BREAST BIOPSY Left 03/09/2023   [place] LT BREAST BX W LOC DEV 1ST LESION IMG BX SPEC US GUIDE 03/09/2023 [facility]   COLONOSCOPY  2018   DILATATION & CURETTAGE/HYSTEROSCOPY WITH MYOSURE N/A 09/29/2022   Procedure: DILATATION & CURETTAGE/HYSTEROSCOPY WITH MYOSURE;  Surgeon: Marlow Baars, MD;  Location: [facility];  Service: Gynecology;  Laterality: N/A;   MASTECTOMY Right 2016   PORT-A-CATH REMOVAL Left 05/13/2015   Procedure: REMOVAL PORT-A-CATH;  Surgeon: Emelia Loron, MD;  Location: [facility];  Service: General;  Laterality: Left;   PORTACATH PLACEMENT Left 11/06/2014   Procedure: INSERTION PORT-A-CATH;  Surgeon: Emelia Loron, MD;  Location: [facility];  Service: General;  Laterality: Left;   SIMPLE MASTECTOMY WITH AXILLARY SENTINEL NODE BIOPSY Right 05/13/2015   Procedure: RIGHT TOTAL  MASTECTOMY WITH RIGHT  AXILLARY SENTINEL NODE BIOPSY;  Surgeon: Emelia Loron, MD;  Location: [facility] OR;  Service: General;  Laterality: Right;   TUBAL LIGATION     yrs ago     Current Outpatient Medications on File Prior to Visit  Medication Sig Dispense Refill   albuterol (VENTOLIN HFA) 108 (90 Base) MCG/ACT inhaler Inhale 2 puffs into the lungs every 6 (six) hours as needed for wheezing or shortness of breath. 8 g 5   ALPRAZolam (XANAX) 0.5 MG tablet Take  1 tablet (0.5 mg total) by mouth 2 (two) times daily as needed for anxiety or sleep. 30 tablet 0   Azelastine HCl 137 MCG/SPRAY SOLN PLACE 2 SPRAYS INTO BOTH NOSTRILS 2 (TWO) TIMES DAILY. USE IN EACH NOSTRIL AS DIRECTED (Patient taking differently: Place 2 sprays into the nose 2 (two) times daily.) 30 mL 2   b complex vitamins tablet Take 1 tablet by mouth daily.     budesonide-formoterol (SYMBICORT) 160-4.5 MCG/ACT inhaler Inhale 2 puffs into the lungs 2 (two) times daily. 10.2 g 5   Cholecalciferol (VITAMIN D3) 50 MCG (2000 UT) TABS Take 1 tablet by mouth daily.     ferrous gluconate (FERGON) 324 MG tablet Take 1 tablet (324 mg total) by mouth 2 (two) times daily with a meal. (Patient taking differently: Take 324 mg by mouth 3 (three) times a week. PER PT TAKES MON/ WED/ FRIDAY TWICE DAILY) 180 tablet 3   fluconazole (DIFLUCAN) 100 MG tablet Take 1 tablet (100 mg total) by mouth daily. 5 tablet 0   fluticasone (FLONASE) 50 MCG/ACT nasal spray SHAKE LIQUID AND USE 2 SPRAYS IN EACH NOSTRIL DAILY 48 mL 2   gabapentin (NEURONTIN) 100 MG capsule Take 1 capsule (100 mg total) by mouth at bedtime. In addition to 300 mg pill for total of 400 mg 30 capsule 5   gabapentin (NEURONTIN) 300 MG capsule TAKE 1 CAPSULE BY MOUTH TWICE A DAY 180 capsule 0   hydrochlorothiazide (HYDRODIURIL) 25 MG tablet TAKE 1 TABLET (25 MG TOTAL) BY MOUTH DAILY. 90 tablet 1   ibuprofen (ADVIL) 800 MG tablet Take 1 tablet (800 mg total) by mouth every 8 (eight) hours as needed. 60 tablet 1   ipratropium (ATROVENT) 0.06 % nasal spray Place 2 sprays into both nostrils 4 (four) times daily as needed (nasal congestion/drainage). 15 mL 5   KLOR-CON M10 10 MEQ tablet TAKE 4 TABLETS BY MOUTH EVERY DAY 360 tablet 1   linaclotide (LINZESS) 145 MCG CAPS capsule TAKE 1  CAPSULE BY MOUTH EVERY DAY BEFORE BREAKFAST 90 capsule 1   loratadine (CLARITIN) 10 MG tablet Take 10 mg by mouth daily as needed for itching, rhinitis or allergies.      losartan (COZAAR) 50 MG tablet TAKE 1 TABLET BY MOUTH EVERY DAY 90 tablet 3   metoprolol tartrate (LOPRESSOR) 50 MG tablet TAKE 1 TABLET BY MOUTH TWICE A DAY 180 tablet 2   pantoprazole (PROTONIX) 40 MG tablet TAKE 1 TABLET BY MOUTH EVERY DAY 90 tablet 1   Tart Cherry 1200 MG CAPS Take 1 capsule by mouth at bedtime.     TURMERIC PO Take 1 capsule by mouth daily.     valACYclovir (VALTREX) 500 MG tablet Take 500 mg by mouth 2 (two) times daily as needed.     Vitamin D, Ergocalciferol, (DRISDOL) 1.25 MG (50000 UNIT) CAPS capsule Take 1 capsule (50,000 Units total) by mouth every 7 (seven) days. 12 capsule 0   XARELTO 20 MG TABS tablet TAKE 1 TABLET(20 MG) BY MOUTH DAILY WITH SUPPER 90 tablet 1   doxycycline (VIBRA-TABS) 100 MG tablet Take 1 tablet (100 mg total) by mouth 2 (two) times daily. (Patient not taking: Reported on 11/13/2023) 20 tablet 0   predniSONE (DELTASONE) 10 MG tablet Take 4 tabs po qd x 3 days, then 3 tabs po qd x 3 days, then 2 tabs po qd x 3 days, then 1 tab po qd x 3 days (Patient not taking: Reported on 11/13/2023) 30 tablet 0   No current facility-administered medications on file prior to visit.    Allergies  Allergen Reactions   Ace Inhibitors Anaphylaxis     Angioedema (tongue swelling)   Clarithromycin Anaphylaxis    Throat swells   Contrave [Naltrexone-Bupropion Hcl Er] Shortness Of Breath    Chest and sob   Levaquin [Levofloxacin] Other (See Comments)    Tendon pain - shoulder and calf   Duloxetine Other (See Comments)    Did not feel herself when taking it     DIAGNOSTIC DATA (LABS, IMAGING, TESTING) - I reviewed patient records, labs, notes, testing and imaging myself where available.  Lab Results  Component Value Date   WBC CANCELED 05/25/2023   HGB 12.9 11/10/2022   HCT 38.2 11/10/2022   MCV 88.7 11/10/2022   PLT 312.0 11/10/2022      Component Value Date/Time   NA 143 05/25/2023 1630   NA 136 09/20/2020 1038   NA 142 04/05/2017 1040   K 4.0  05/25/2023 1630   K 4.5 04/05/2017 1040   CL 106 05/25/2023 1630   CO2 26 05/25/2023 1630   CO2 25 04/05/2017 1040   GLUCOSE 99 05/25/2023 1630   GLUCOSE 89 04/05/2017 1040   BUN 14 05/25/2023 1630   BUN 9 09/20/2020 1038   BUN 18.6 04/05/2017 1040   CREATININE 0.90 05/25/2023 1630   CREATININE 1.0 04/05/2017 1040   CALCIUM 10.1 05/25/2023 1630   CALCIUM 10.2 04/05/2017 1040   PROT 7.7 05/25/2023 1630   PROT 7.6 09/20/2020 1038   PROT 7.7 04/05/2017 1040   ALBUMIN 4.6 11/10/2022 0915   ALBUMIN 4.8 09/20/2020 1038   ALBUMIN 4.2 04/05/2017 1040   AST 17 05/25/2023 1630   AST 17 03/25/2020 0843   AST 17 04/05/2017 1040   ALT 19 05/25/2023 1630   ALT 21 03/25/2020 0843   ALT 17 04/05/2017 1040   ALKPHOS 76 11/10/2022 0915   ALKPHOS 101 04/05/2017 1040   BILITOT 0.6 05/25/2023 1630   BILITOT  0.5 09/20/2020 1038   BILITOT 0.5 03/25/2020 0843   BILITOT 0.56 04/05/2017 1040   GFRNONAA 80 09/20/2020 1038   GFRNONAA >60 03/25/2020 0843   GFRAA 93 09/20/2020 1038   GFRAA >60 03/25/2020 0843   Lab Results  Component Value Date   CHOL 220 (H) 11/10/2022   HDL 61.90 11/10/2022   LDLCALC 127 (H) 11/10/2022   LDLDIRECT 111.0 11/09/2021   TRIG 156.0 (H) 11/10/2022   CHOLHDL 4 11/10/2022   Lab Results  Component Value Date   HGBA1C 5.8 11/10/2022   Lab Results  Component Value Date   VITAMINB12 1,165 09/20/2020   Lab Results  Component Value Date   TSH 2.64 05/25/2023    PHYSICAL EXAM:  Today's Vitals   11/13/23 1437  BP: 129/80  Pulse: 88  Weight: 208 lb 12.8 oz (94.7 kg)  Height: 5\' 3"  (1.6 m)   Body mass index is 36.99 kg/m.   Wt Readings from Last 3 Encounters:  11/13/23 208 lb 12.8 oz (94.7 kg)  10/23/23 209 lb (94.8 kg)  10/08/23 208 lb (94.3 kg)     Ht Readings from Last 3 Encounters:  11/13/23 5\' 3"  (1.6 m)  10/23/23 5\' 5"  (1.651 m)  10/08/23 5\' 5"  (1.651 m)      General: The patient is awake, alert and appears not in acute distress. The  patient is well groomed. Head: Normocephalic, atraumatic. Neck is supple. Mallampati 2-3,  neck circumference:16 inches . Nasal airflow patent.   Overbite Harlow Asa is  seen.  Dental status: biological  Cardiovascular:  Regular rate and cardiac rhythm by pulse,  without distended neck veins. Respiratory: Lungs are clear to auscultation.  Skin:  Without evidence of ankle edema, or rash. Trunk: The patient's posture is erect.   NEUROLOGIC EXAM: The patient is awake and alert, oriented to place and time.   Memory subjective described as intact.  Attention span & concentration ability appears normal.  Speech is fluent,  without  dysarthria, dysphonia or aphasia.  Mood and affect are appropriate.   Cranial nerves: no loss of smell or taste reported  Pupils are equal and briskly reactive to light. Funduscopic exam deferred..  Extraocular movements in vertical and horizontal planes were intact and without nystagmus. No Diplopia. Visual fields by finger perimetry are intact. Hearing was intact to soft voice and finger rubbing.    Facial sensation intact to fine touch.  Facial motor strength is symmetric and tongue and uvula move midline.  Neck ROM : rotation, tilt and flexion extension were normal for age and shoulder shrug was symmetrical.    Motor exam:  Symmetric bulk, tone and ROM.   Normal tone without cog wheeling, symmetric grip strength .   Sensory:  Fine touch, pinprick and vibration were tested  and  normal.   There was preserved sensation of fine touch and vibration from tuning fork in all fingers and tips.  She describes at night being woken by a pain that starts in the ring and middle fingers and palms, radiates to the elbow but not above, stretching the arms and moving helps.  Proprioception tested in the upper extremities was normal.   Coordination: Rapid alternating movements in the fingers/hands were of normal speed.  The Finger-to-nose maneuver was intact without  evidence of ataxia, dysmetria or tremor.   Gait and station: Patient could rise unassisted from a seated position, walked without assistive device.  Stance is of normal width/ base and the patient turned with 3 steps.  Toe  and heel walk were deferred.  Deep tendon reflexes: in the  upper and lower extremities are symmetric and intact.  Babinski response was deferred .    ASSESSMENT AND PLAN 63 y.o. year old female  here with:    1) There was preserved sensation of fine touch and vibration from tuning fork in all fingers and tips.  She describes at night being woken by a pain that starts in the ring and middle fingers and palms, radiates to the elbow but not above, stretching the arms and moving helps.   This  will call for a cervical spine MRI with contrast, and I will need to defer for NCV and EMG to another  practitioner. My practice is Sleep neurology and epilepsy,  and our office has limited services for EMG and NCV available.  I will refer to phys medicine for NCS and EMG.    2)  rheumatological panel ordered, the swelling in both hands and burning sensation call for inflammatory markers.    3) MRI  cervical cord and spine.   4) EMG and NCV  referral to phys med.   5) gabapentin no longer working,  change to Lyrica, Prec gabalin. First month 50 mg tid po.   And second months will increase to 150 mg at bedtime po.  D/c gabapentin .    RV through our NP within 4-6 months.   I would like to thank Pincus Sanes, MD and Pincus Sanes, Md [address] [place],  [place] [address] for allowing me to meet with and to take care of this pleasant patient.   CCAfter spending a total time of  35  minutes face to face and additional time for physical and neurologic examination, review of laboratory studies,  personal review of imaging studies, reports and results of other testing and review of referral information / records as far as provided in visit,   Electronically signed by: Melvyn Novas, MD 11/13/2023 3:14 PM  [facility] Board certified by The [employer] of Sleep Medicine and Diplomate of the [employer] of Sleep Medicine. Board certified In Neurology through the ABPN, Fellow of the [employer] of Neu[employer].

## 2023-11-13 NOTE — Patient Instructions (Signed)
 63 y.o. year old female  here with:     1) There was preserved sensation of fine touch and vibration from tuning fork in all fingers and tips.  She describes at night being woken by a pain that starts in the ring and middle fingers and palms, radiates to the elbow but not above, stretching the arms and moving helps.   This  will call for a cervical spine MRI with contrast, and I will need to defer for NCV and EMG to another  practitioner. My practice is Sleep neurology and epilepsy,  and our office has limited services for EMG and NCV available.  I will refer to phys medicine for NCS and EMG.      2)  rheumatological panel ordered, the swelling in both hands and burning sensation call for inflammatory markers.     3) MRI  cervical cord and spine.    4) EMG and NCV  referral to phys med.        RV through our NP within 4-6 months.    I would like to thank Pincus Sanes, MD

## 2023-11-14 ENCOUNTER — Encounter: Payer: Self-pay | Admitting: Internal Medicine

## 2023-11-14 ENCOUNTER — Encounter: Payer: Self-pay | Admitting: Physical Medicine & Rehabilitation

## 2023-11-14 DIAGNOSIS — L659 Nonscarring hair loss, unspecified: Secondary | ICD-10-CM

## 2023-11-14 DIAGNOSIS — L989 Disorder of the skin and subcutaneous tissue, unspecified: Secondary | ICD-10-CM

## 2023-11-19 ENCOUNTER — Encounter: Payer: Self-pay | Admitting: Neurology

## 2023-11-19 LAB — COMPREHENSIVE METABOLIC PANEL
ALT: 20 IU/L (ref 0–32)
AST: 24 IU/L (ref 0–40)
Albumin: 4.6 g/dL (ref 3.9–4.9)
Alkaline Phosphatase: 111 IU/L (ref 44–121)
BUN/Creatinine Ratio: 15 (ref 12–28)
BUN: 13 mg/dL (ref 8–27)
Bilirubin Total: 0.3 mg/dL (ref 0.0–1.2)
CO2: 21 mmol/L (ref 20–29)
Calcium: 9.8 mg/dL (ref 8.7–10.3)
Chloride: 101 mmol/L (ref 96–106)
Creatinine, Ser: 0.85 mg/dL (ref 0.57–1.00)
Globulin, Total: 2.3 g/dL (ref 1.5–4.5)
Glucose: 97 mg/dL (ref 70–99)
Potassium: 4.1 mmol/L (ref 3.5–5.2)
Sodium: 142 mmol/L (ref 134–144)
Total Protein: 6.9 g/dL (ref 6.0–8.5)
eGFR: 78 mL/min/{1.73_m2} (ref >59)

## 2023-11-19 LAB — CBC WITH DIFFERENTIAL/PLATELET
Basophils Absolute: 0 10*3/uL (ref 0.0–0.2)
Basos: 0 %
EOS (ABSOLUTE): 0.2 10*3/uL (ref 0.0–0.4)
Eos: 3 %
Hematocrit: 38.3 % (ref 34.0–46.6)
Hemoglobin: 13.1 g/dL (ref 11.1–15.9)
Immature Grans (Abs): 0 10*3/uL (ref 0.0–0.1)
Immature Granulocytes: 0 %
Lymphocytes Absolute: 2.7 10*3/uL (ref 0.7–3.1)
Lymphs: 49 %
MCH: 30 pg (ref 26.6–33.0)
MCHC: 34.2 g/dL (ref 31.5–35.7)
MCV: 88 fL (ref 79–97)
Monocytes Absolute: 0.4 10*3/uL (ref 0.1–0.9)
Monocytes: 7 %
Neutrophils Absolute: 2.3 10*3/uL (ref 1.4–7.0)
Neutrophils: 41 %
Platelets: 290 10*3/uL (ref 150–450)
RBC: 4.37 x10E6/uL (ref 3.77–5.28)
RDW: 13.9 % (ref 11.7–15.4)
WBC: 5.6 10*3/uL (ref 3.4–10.8)

## 2023-11-19 LAB — MULTIPLE MYELOMA PANEL, SERUM
Albumin SerPl Elph-Mcnc: 4 g/dL (ref 2.9–4.4)
Albumin/Glob SerPl: 1.4 (ref 0.7–1.7)
Alpha 1: 0.2 g/dL (ref 0.0–0.4)
Alpha2 Glob SerPl Elph-Mcnc: 0.5 g/dL (ref 0.4–1.0)
B-Globulin SerPl Elph-Mcnc: 1.2 g/dL (ref 0.7–1.3)
Gamma Glob SerPl Elph-Mcnc: 0.9 g/dL (ref 0.4–1.8)
Globulin, Total: 2.9 g/dL (ref 2.2–3.9)
IgA/Immunoglobulin A, Serum: 214 mg/dL (ref 87–352)
IgG (Immunoglobin G), Serum: 1108 mg/dL (ref 586–1602)
IgM (Immunoglobulin M), Srm: 169 mg/dL (ref 26–217)

## 2023-11-19 LAB — C-REACTIVE PROTEIN: CRP: 11 mg/L — ABNORMAL HIGH (ref 0–10)

## 2023-11-19 LAB — ANA W/REFLEX: ANA Titer 1: NEGATIVE

## 2023-11-19 LAB — METHYLMALONIC ACID, SERUM: Methylmalonic Acid: 149 nmol/L (ref 0–378)

## 2023-11-19 LAB — SJOGREN'S SYNDROME ANTIBODS(SSA + SSB)
ENA SSA (RO) Ab: <0.2 AI (ref 0.0–0.9)
ENA SSB (LA) Ab: <0.2 AI (ref 0.0–0.9)

## 2023-11-19 LAB — SEDIMENTATION RATE: Sed Rate: 7 mm/h (ref 0–40)

## 2023-11-21 ENCOUNTER — Ambulatory Visit: Payer: 59 | Admitting: Family Medicine

## 2023-11-28 ENCOUNTER — Ambulatory Visit
Admission: RE | Admit: 2023-11-28 | Discharge: 2023-11-28 | Disposition: A | Discharge status: Home / Self Care | Payer: 59 | Source: Ambulatory Visit | Attending: Internal Medicine | Admitting: Internal Medicine

## 2023-11-28 DIAGNOSIS — Z1231 Encounter for screening mammogram for malignant neoplasm of breast: Secondary | ICD-10-CM

## 2023-12-02 ENCOUNTER — Encounter: Payer: Self-pay | Admitting: Internal Medicine

## 2023-12-02 NOTE — Progress Notes (Unsigned)
 Subjective:    Patient ID: [name] [name], female    DOB: 24-Feb-1961, 63 y.o.   MRN: [id number]      HPI [name] [name] is here for a Physical exam and her chronic medical problems.   Retired and her body feels better  Hands swell mid day - goes away overnight.  The joints hurt when this happens.  No other joint pain or swelling.     Medications and allergies reviewed with patient and updated if appropriate.  Current Outpatient Medications on File Prior to Visit  Medication Sig Dispense Refill   albuterol (VENTOLIN HFA) 108 (90 Base) MCG/ACT inhaler Inhale 2 puffs into the lungs every 6 (six) hours as needed for wheezing or shortness of breath. 8 g 5   ALPRAZolam (XANAX) 0.5 MG tablet Take 1 tablet (0.5 mg total) by mouth 2 (two) times daily as needed for anxiety or sleep. 30 tablet 0   Azelastine HCl 137 MCG/SPRAY SOLN PLACE 2 SPRAYS INTO BOTH NOSTRILS 2 (TWO) TIMES DAILY. USE IN EACH NOSTRIL AS DIRECTED (Patient taking differently: Place 2 sprays into the nose 2 (two) times daily.) 30 mL 2   b complex vitamins tablet Take 1 tablet by mouth daily.     budesonide-formoterol (SYMBICORT) 160-4.5 MCG/ACT inhaler Inhale 2 puffs into the lungs 2 (two) times daily. 10.2 g 5   Cholecalciferol (VITAMIN D3) 50 MCG (2000 UT) TABS Take 1 tablet by mouth daily.     ferrous gluconate (FERGON) 324 MG tablet Take 1 tablet (324 mg total) by mouth 2 (two) times daily with a meal. (Patient taking differently: Take 324 mg by mouth 3 (three) times a week. PER PT TAKES MON/ WED/ FRIDAY TWICE DAILY) 180 tablet 3   fluconazole (DIFLUCAN) 100 MG tablet Take 1 tablet (100 mg total) by mouth daily. 5 tablet 0   fluticasone (FLONASE) 50 MCG/ACT nasal spray SHAKE LIQUID AND USE 2 SPRAYS IN EACH NOSTRIL DAILY 48 mL 2   hydrochlorothiazide (HYDRODIURIL) 25 MG tablet TAKE 1 TABLET (25 MG TOTAL) BY MOUTH DAILY. 90 tablet 1   ibuprofen (ADVIL) 800 MG tablet Take 1 tablet (800 mg total) by mouth every 8 (eight)  hours as needed. 60 tablet 1   ipratropium (ATROVENT) 0.06 % nasal spray Place 2 sprays into both nostrils 4 (four) times daily as needed (nasal congestion/drainage). 15 mL 5   KLOR-CON M10 10 MEQ tablet TAKE 4 TABLETS BY MOUTH EVERY DAY 360 tablet 1   linaclotide (LINZESS) 145 MCG CAPS capsule TAKE 1 CAPSULE BY MOUTH EVERY DAY BEFORE BREAKFAST 90 capsule 1   loratadine (CLARITIN) 10 MG tablet Take 10 mg by mouth daily as needed for itching, rhinitis or allergies.     losartan (COZAAR) 50 MG tablet TAKE 1 TABLET BY MOUTH EVERY DAY 90 tablet 3   metoprolol tartrate (LOPRESSOR) 50 MG tablet TAKE 1 TABLET BY MOUTH TWICE A DAY 180 tablet 2   pantoprazole (PROTONIX) 40 MG tablet TAKE 1 TABLET BY MOUTH EVERY DAY 90 tablet 1   pregabalin (LYRICA) 50 MG capsule Take 1 capsule (50 mg total) by mouth 3 (three) times daily. 90 capsule 2   Tart Cherry 1200 MG CAPS Take 1 capsule by mouth at bedtime.     TURMERIC PO Take 1 capsule by mouth daily.     valACYclovir (VALTREX) 500 MG tablet Take 500 mg by mouth 2 (two) times daily as needed.     Vitamin D, Ergocalciferol, (DRISDOL) 1.25 MG (50000 UNIT) CAPS capsule Take  1 capsule (50,000 Units total) by mouth every 7 (seven) days. 12 capsule 0   XARELTO 20 MG TABS tablet TAKE 1 TABLET(20 MG) BY MOUTH DAILY WITH SUPPER 90 tablet 1   No current facility-administered medications on file prior to visit.    Review of Systems  Constitutional:  Negative for fever.  Eyes:  Negative for visual disturbance.  Respiratory:  Positive for wheezing (occ with allergies). Negative for cough and shortness of breath.   Cardiovascular:  Negative for chest pain, palpitations and leg swelling.  Gastrointestinal:  Negative for abdominal pain, blood in stool, constipation and diarrhea.       No gerd  Genitourinary:  Negative for dysuria.  Musculoskeletal:  Positive for arthralgias (hands). Negative for back pain.  Skin:  Negative for rash.  Neurological:  Negative for  light-headedness and headaches.  Psychiatric/Behavioral:  Positive for sleep disturbance (getting better). Negative for dysphoric mood. The patient is nervous/anxious (controlled).        Objective:   Vitals:   12/03/23 0813  BP: 120/76  Pulse: 73  Temp: 98.2 F (36.8 C)  SpO2: 98%   Filed Weights   12/03/23 0813  Weight: 206 lb (93.4 kg)   Body mass index is 36.49 kg/m.  BP Readings from Last 3 Encounters:  12/03/23 120/76  11/13/23 129/80  10/23/23 112/78    Wt Readings from Last 3 Encounters:  12/03/23 206 lb (93.4 kg)  11/13/23 208 lb 12.8 oz (94.7 kg)  10/23/23 209 lb (94.8 kg)       Physical Exam Constitutional: She appears well-developed and well-nourished. No distress.  HENT:  Head: Normocephalic and atraumatic.  Right Ear: External ear normal. Normal ear canal and TM Left Ear: External ear normal.  Normal ear canal and TM Mouth/Throat: Oropharynx is clear and moist.  Eyes: Conjunctivae normal.  Neck: Neck supple. No tracheal deviation present. No thyromegaly present.  No carotid bruit  Cardiovascular: Normal rate, regular rhythm and normal heart sounds.   No murmur heard.  No edema. Pulmonary/Chest: Effort normal and breath sounds normal. No respiratory distress. She has no wheezes. She has no rales.  Breast: deferred   Abdominal: Soft. She exhibits no distension. There is no tenderness.  Lymphadenopathy: She has no cervical adenopathy.  Skin: Skin is warm and dry. She is not diaphoretic.  Psychiatric: She has a normal mood and affect. Her behavior is normal.     Lab Results  Component Value Date   WBC 5.6 11/13/2023   HGB 13.1 11/13/2023   HCT 38.3 11/13/2023   PLT 290 11/13/2023   GLUCOSE 97 11/13/2023   CHOL 220 (H) 11/10/2022   TRIG 156.0 (H) 11/10/2022   HDL 61.90 11/10/2022   LDLDIRECT 111.0 11/09/2021   LDLCALC 127 (H) 11/10/2022   ALT 20 11/13/2023   AST 24 11/13/2023   NA 142 11/13/2023   K 4.1 11/13/2023   CL 101 11/13/2023    CREATININE 0.85 11/13/2023   BUN 13 11/13/2023   CO2 21 11/13/2023   TSH 2.64 05/25/2023   HGBA1C 5.8 11/10/2022         Assessment & Plan:   Physical exam: Screening blood work  ordered Exercise  joined an aerobics class - twice a week and will walk other days Weight  obese - encouraged weight loss Substance abuse  none   Reviewed recommended immunizations.   Health Maintenance  Topic Date Due   Pneumococcal Vaccine 38-39 Years old (1 of 2 - PCV) Never done  COVID-19 Vaccine (3 - Moderna risk series) 01/13/2020   Cervical Cancer Screening (HPV/Pap Cotest)  12/04/2023   INFLUENZA VACCINE  12/17/2023 (Originally 04/19/2023)   Colonoscopy  08/26/2024   MAMMOGRAM  11/27/2025   DTaP/Tdap/Td (4 - Td or Tdap) 09/01/2027   Hepatitis C Screening  Completed   HIV Screening  Completed   Zoster Vaccines- Shingrix  Completed   HPV VACCINES  Aged Out          See Problem List for Assessment and Plan of chronic medical problems.

## 2023-12-02 NOTE — Patient Instructions (Addendum)

## 2023-12-03 ENCOUNTER — Ambulatory Visit (INDEPENDENT_AMBULATORY_CARE_PROVIDER_SITE_OTHER): Payer: 59 | Admitting: Internal Medicine

## 2023-12-03 VITALS — BP 120/76 | HR 73 | Temp 98.2°F | Ht 63.0 in | Wt 206.0 lb

## 2023-12-03 DIAGNOSIS — R7303 Prediabetes: Secondary | ICD-10-CM

## 2023-12-03 DIAGNOSIS — J4530 Mild persistent asthma, uncomplicated: Secondary | ICD-10-CM

## 2023-12-03 DIAGNOSIS — K5909 Other constipation: Secondary | ICD-10-CM | POA: Diagnosis not present

## 2023-12-03 DIAGNOSIS — E876 Hypokalemia: Secondary | ICD-10-CM

## 2023-12-03 DIAGNOSIS — I10 Essential (primary) hypertension: Secondary | ICD-10-CM | POA: Diagnosis not present

## 2023-12-03 DIAGNOSIS — G4733 Obstructive sleep apnea (adult) (pediatric): Secondary | ICD-10-CM

## 2023-12-03 DIAGNOSIS — Z136 Encounter for screening for cardiovascular disorders: Secondary | ICD-10-CM

## 2023-12-03 DIAGNOSIS — I484 Atypical atrial flutter: Secondary | ICD-10-CM

## 2023-12-03 DIAGNOSIS — K219 Gastro-esophageal reflux disease without esophagitis: Secondary | ICD-10-CM

## 2023-12-03 DIAGNOSIS — Z0000 Encounter for general adult medical examination without abnormal findings: Secondary | ICD-10-CM | POA: Diagnosis not present

## 2023-12-03 DIAGNOSIS — E559 Vitamin D deficiency, unspecified: Secondary | ICD-10-CM

## 2023-12-03 LAB — LIPID PANEL
Cholesterol: 213 mg/dL — ABNORMAL HIGH (ref 0–200)
HDL: 47.7 mg/dL (ref >39.00)
LDL Cholesterol: 112 mg/dL — ABNORMAL HIGH (ref 0–99)
NonHDL: 165.12
Total CHOL/HDL Ratio: 4
Triglycerides: 264 mg/dL — ABNORMAL HIGH (ref 0.0–149.0)
VLDL: 52.8 mg/dL — ABNORMAL HIGH (ref 0.0–40.0)

## 2023-12-03 LAB — HEMOGLOBIN A1C: Hgb A1c MFr Bld: 5.7 % (ref 4.6–6.5)

## 2023-12-03 NOTE — Assessment & Plan Note (Signed)
Chronic Continue Linzess 145 mcg daily

## 2023-12-03 NOTE — Assessment & Plan Note (Signed)
 Chronic Mild, intermittent Continue Symbicort 160-4.5 mcg per ACT 2 puffs twice daily as needed Continue albuterol inhaler as needed - 1-2 times a month

## 2023-12-03 NOTE — Assessment & Plan Note (Addendum)
 Chronic Blood pressure well controlled Check lipid panel Continue metoprolol 50 mg twice daily, losartan 50 mg daily, HCTZ 25 mg daily

## 2023-12-03 NOTE — Assessment & Plan Note (Signed)
 Chronic GERD controlled Continue pantoprazole 40 mg daily

## 2023-12-03 NOTE — Assessment & Plan Note (Signed)
 Chronic CMP -done recently-potassium was normal Continue Klor-Con 40 mill equivalent daily

## 2023-12-03 NOTE — Assessment & Plan Note (Addendum)
Chronic Check a1c, lipid panel Low sugar / carb diet Stressed regular exercise

## 2023-12-03 NOTE — Assessment & Plan Note (Signed)
 Chronic Has not seen cardiology in a little while-discussed following up with them again for reevaluation. On Xarelto 20 mg daily, metoprolol 50 mg twice daily CBC, CMP done recently-within normal limits

## 2023-12-04 ENCOUNTER — Encounter: Payer: Self-pay | Admitting: Internal Medicine

## 2023-12-06 ENCOUNTER — Other Ambulatory Visit: Payer: Self-pay | Admitting: Internal Medicine

## 2023-12-13 ENCOUNTER — Encounter: Payer: Self-pay | Admitting: Internal Medicine

## 2023-12-19 ENCOUNTER — Encounter: Payer: Self-pay | Admitting: Internal Medicine

## 2023-12-19 ENCOUNTER — Ambulatory Visit (INDEPENDENT_AMBULATORY_CARE_PROVIDER_SITE_OTHER): Admitting: Internal Medicine

## 2023-12-19 VITALS — BP 112/72 | HR 68 | Temp 98.3°F | Ht 63.0 in | Wt 206.0 lb

## 2023-12-19 DIAGNOSIS — J0100 Acute maxillary sinusitis, unspecified: Secondary | ICD-10-CM | POA: Diagnosis not present

## 2023-12-19 MED ORDER — AZITHROMYCIN 250 MG PO TABS: ORAL_TABLET | ORAL | 0 refills | Status: DC

## 2023-12-19 MED ORDER — HYDROCOD POLI-CHLORPHE POLI ER 10-8 MG/5ML PO SUER: 5.0000 mL | Freq: Two times a day (BID) | ORAL | 0 refills | Status: DC | PRN

## 2023-12-19 NOTE — Progress Notes (Signed)
 Subjective:    Patient ID: [name] [name], female    DOB: [birth year], 62 y.o.   MRN: [id number]      HPI [name] [name] is here for  Chief Complaint  Patient presents with   Sinusitis    Sinus headache over a week and not sleeping due to cough    Her symptoms started one week ago.   She states nasal congestion, PND, sinus pain, cough that is productive in the morning and worse at night, wheezing, headaches and dizziness.  She denies fever or SOB.   She does her nasal rinses.   She is using her inhalers.    Medications and allergies reviewed with patient and updated if appropriate.  Current Outpatient Medications on File Prior to Visit  Medication Sig Dispense Refill   albuterol (VENTOLIN HFA) 108 (90 Base) MCG/ACT inhaler Inhale 2 puffs into the lungs every 6 (six) hours as needed for wheezing or shortness of breath. 8 g 5   ALPRAZolam (XANAX) 0.5 MG tablet Take 1 tablet (0.5 mg total) by mouth 2 (two) times daily as needed for anxiety or sleep. 30 tablet 0   Azelastine HCl 137 MCG/SPRAY SOLN PLACE 2 SPRAYS INTO BOTH NOSTRILS 2 (TWO) TIMES DAILY. USE IN EACH NOSTRIL AS DIRECTED (Patient taking differently: Place 2 sprays into the nose 2 (two) times daily.) 30 mL 2   b complex vitamins tablet Take 1 tablet by mouth daily.     budesonide-formoterol (SYMBICORT) 160-4.5 MCG/ACT inhaler Inhale 2 puffs into the lungs 2 (two) times daily. 10.2 g 5   Cholecalciferol (VITAMIN D3) 50 MCG (2000 UT) TABS Take 1 tablet by mouth daily.     ferrous gluconate (FERGON) 324 MG tablet Take 1 tablet (324 mg total) by mouth 2 (two) times daily with a meal. (Patient taking differently: Take 324 mg by mouth 3 (three) times a week. PER PT TAKES MON/ WED/ FRIDAY TWICE DAILY) 180 tablet 3   fluconazole (DIFLUCAN) 100 MG tablet Take 1 tablet (100 mg total) by mouth daily. 5 tablet 0   fluticasone (FLONASE) 50 MCG/ACT nasal spray SHAKE LIQUID AND USE 2 SPRAYS IN EACH NOSTRIL DAILY 48 mL 2    hydrochlorothiazide (HYDRODIURIL) 25 MG tablet TAKE 1 TABLET (25 MG TOTAL) BY MOUTH DAILY. 90 tablet 1   ibuprofen (ADVIL) 800 MG tablet Take 1 tablet (800 mg total) by mouth every 8 (eight) hours as needed. 60 tablet 1   ipratropium (ATROVENT) 0.06 % nasal spray Place 2 sprays into both nostrils 4 (four) times daily as needed (nasal congestion/drainage). 15 mL 5   KLOR-CON M10 10 MEQ tablet TAKE 4 TABLETS BY MOUTH EVERY DAY 360 tablet 1   linaclotide (LINZESS) 145 MCG CAPS capsule TAKE 1 CAPSULE BY MOUTH EVERY DAY BEFORE BREAKFAST 90 capsule 1   loratadine (CLARITIN) 10 MG tablet Take 10 mg by mouth daily as needed for itching, rhinitis or allergies.     losartan (COZAAR) 50 MG tablet TAKE 1 TABLET BY MOUTH EVERY DAY 90 tablet 3   metoprolol tartrate (LOPRESSOR) 50 MG tablet TAKE 1 TABLET BY MOUTH TWICE A DAY 180 tablet 2   pantoprazole (PROTONIX) 40 MG tablet TAKE 1 TABLET BY MOUTH EVERY DAY 90 tablet 1   Tart Cherry 1200 MG CAPS Take 1 capsule by mouth at bedtime.     TURMERIC PO Take 1 capsule by mouth daily.     valACYclovir (VALTREX) 500 MG tablet Take 500 mg by mouth 2 (two) times daily as needed.  Vitamin D, Ergocalciferol, (DRISDOL) 1.25 MG (50000 UNIT) CAPS capsule Take 1 capsule (50,000 Units total) by mouth every 7 (seven) days. 12 capsule 0   XARELTO 20 MG TABS tablet TAKE 1 TABLET(20 MG) BY MOUTH DAILY WITH SUPPER 90 tablet 1   No current facility-administered medications on file prior to visit.    Review of Systems  Constitutional:  Negative for fever.  HENT:  Positive for congestion, postnasal drip and sinus pain. Negative for sore throat (resolved).        Teeth hurt  Respiratory:  Positive for cough (productive in morning, awful cough at night) and wheezing (a little  - uses inhalers). Negative for shortness of breath.   Neurological:  Positive for dizziness and headaches.       Objective:   Vitals:   12/19/23 1449  BP: 112/72  Pulse: 68  Temp: 98.3 F (36.8 C)   SpO2: 96%   BP Readings from Last 3 Encounters:  12/19/23 112/72  12/03/23 120/76  11/13/23 129/80   Wt Readings from Last 3 Encounters:  12/19/23 206 lb (93.4 kg)  12/03/23 206 lb (93.4 kg)  11/13/23 208 lb 12.8 oz (94.7 kg)   Body mass index is 36.49 kg/m.    Physical Exam Constitutional:      General: She is not in acute distress.    Appearance: Normal appearance. She is not ill-appearing.  HENT:     Head: Normocephalic and atraumatic.     Right Ear: Tympanic membrane, ear canal and external ear normal.     Left Ear: Tympanic membrane, ear canal and external ear normal.     Mouth/Throat:     Mouth: Mucous membranes are moist.     Pharynx: No oropharyngeal exudate or posterior oropharyngeal erythema.  Eyes:     Conjunctiva/sclera: Conjunctivae normal.  Cardiovascular:     Rate and Rhythm: Normal rate and regular rhythm.  Pulmonary:     Effort: Pulmonary effort is normal. No respiratory distress.     Breath sounds: Normal breath sounds. No wheezing or rales.  Musculoskeletal:     Cervical back: Neck supple. No tenderness.  Lymphadenopathy:     Cervical: No cervical adenopathy.  Skin:    General: Skin is warm and dry.  Neurological:     Mental Status: She is alert.            Assessment & Plan:    See Problem List for Assessment and Plan of chronic medical problems.

## 2023-12-19 NOTE — Assessment & Plan Note (Signed)
 Acute Likely bacterial  Start zpak Tussionex cough syrup otc cold medications Rest, fluid Call if no improvement

## 2023-12-19 NOTE — Patient Instructions (Addendum)
        Medications changes include :   zpak, tussionex cough syrup      Return if symptoms worsen or fail to improve.

## 2023-12-21 ENCOUNTER — Encounter: Payer: 59 | Admitting: Physical Medicine & Rehabilitation

## 2024-01-02 ENCOUNTER — Other Ambulatory Visit: Payer: Self-pay | Admitting: Internal Medicine

## 2024-01-10 ENCOUNTER — Other Ambulatory Visit: Payer: Self-pay | Admitting: Internal Medicine

## 2024-01-14 ENCOUNTER — Encounter: Payer: Self-pay | Admitting: Family Medicine

## 2024-01-21 NOTE — Progress Notes (Unsigned)
 Subjective:    Patient ID: [name] [name], female    DOB: [birth year], 62 y.o.   MRN: [id number]      HPI [name] [name] is here for No chief complaint on file.   Joint pain -    ANA, sjogren's tests negative 10/2023  Medications and allergies reviewed with patient and updated if appropriate.  Current Outpatient Medications on File Prior to Visit  Medication Sig Dispense Refill   albuterol  (VENTOLIN  HFA) 108 (90 Base) MCG/ACT inhaler Inhale 2 puffs into the lungs every 6 (six) hours as needed for wheezing or shortness of breath. 8 g 5   ALPRAZolam  (XANAX ) 0.5 MG tablet Take 1 tablet (0.5 mg total) by mouth 2 (two) times daily as needed for anxiety or sleep. 30 tablet 0   Azelastine  HCl 137 MCG/SPRAY SOLN PLACE 2 SPRAYS INTO BOTH NOSTRILS 2 (TWO) TIMES DAILY. USE IN EACH NOSTRIL AS DIRECTED (Patient taking differently: Place 2 sprays into the nose 2 (two) times daily.) 30 mL 2   azithromycin  (ZITHROMAX ) 250 MG tablet Take two tabs the first day and then one tab daily for four days 6 tablet 0   b complex vitamins tablet Take 1 tablet by mouth daily.     budesonide -formoterol  (SYMBICORT ) 160-4.5 MCG/ACT inhaler Inhale 2 puffs into the lungs 2 (two) times daily. 10.2 g 5   chlorpheniramine-HYDROcodone  (TUSSIONEX) 10-8 MG/5ML Take 5 mLs by mouth every 12 (twelve) hours as needed. 115 mL 0   Cholecalciferol (VITAMIN D3) 50 MCG (2000 UT) TABS Take 1 tablet by mouth daily.     ferrous gluconate  (FERGON) 324 MG tablet Take 1 tablet (324 mg total) by mouth 2 (two) times daily with a meal. (Patient taking differently: Take 324 mg by mouth 3 (three) times a week. PER PT TAKES MON/ WED/ FRIDAY TWICE DAILY) 180 tablet 3   fluconazole  (DIFLUCAN ) 100 MG tablet Take 1 tablet (100 mg total) by mouth daily. 5 tablet 0   fluticasone  (FLONASE ) 50 MCG/ACT nasal spray SHAKE LIQUID AND USE 2 SPRAYS IN EACH NOSTRIL DAILY 48 mL 2   hydrochlorothiazide  (HYDRODIURIL ) 25 MG tablet TAKE 1 TABLET (25 MG TOTAL) BY  MOUTH DAILY. 90 tablet 1   ibuprofen  (ADVIL ) 800 MG tablet Take 1 tablet (800 mg total) by mouth every 8 (eight) hours as needed. 60 tablet 1   ipratropium (ATROVENT ) 0.06 % nasal spray Place 2 sprays into both nostrils 4 (four) times daily as needed (nasal congestion/drainage). 15 mL 5   KLOR-CON  M10 10 MEQ tablet TAKE 4 TABLETS BY MOUTH EVERY DAY 360 tablet 1   LINZESS  145 MCG CAPS capsule TAKE 1 CAPSULE BY MOUTH EVERY DAY BEFORE BREAKFAST 90 capsule 1   loratadine  (CLARITIN ) 10 MG tablet Take 10 mg by mouth daily as needed for itching, rhinitis or allergies.     losartan  (COZAAR ) 50 MG tablet TAKE 1 TABLET BY MOUTH EVERY DAY 90 tablet 3   metoprolol  tartrate (LOPRESSOR ) 50 MG tablet TAKE 1 TABLET BY MOUTH TWICE A DAY 180 tablet 2   pantoprazole  (PROTONIX ) 40 MG tablet TAKE 1 TABLET BY MOUTH EVERY DAY 90 tablet 1   Tart Cherry 1200 MG CAPS Take 1 capsule by mouth at bedtime.     TURMERIC PO Take 1 capsule by mouth daily.     valACYclovir  (VALTREX ) 500 MG tablet Take 500 mg by mouth 2 (two) times daily as needed.     Vitamin D , Ergocalciferol , (DRISDOL ) 1.25 MG (50000 UNIT) CAPS capsule Take 1 capsule (50,000  Units total) by mouth every 7 (seven) days. 12 capsule 0   XARELTO  20 MG TABS tablet TAKE 1 TABLET(20 MG) BY MOUTH DAILY WITH SUPPER 90 tablet 1   No current facility-administered medications on file prior to visit.    Review of Systems     Objective:  There were no vitals filed for this visit. BP Readings from Last 3 Encounters:  12/19/23 112/72  12/03/23 120/76  11/13/23 129/80   Wt Readings from Last 3 Encounters:  12/19/23 206 lb (93.4 kg)  12/03/23 206 lb (93.4 kg)  11/13/23 208 lb 12.8 oz (94.7 kg)   There is no height or weight on file to calculate BMI.    Physical Exam         Assessment & Plan:    See Problem List for Assessment and Plan of chronic medical problems.

## 2024-01-22 ENCOUNTER — Encounter: Payer: Self-pay | Admitting: Internal Medicine

## 2024-01-22 ENCOUNTER — Ambulatory Visit (INDEPENDENT_AMBULATORY_CARE_PROVIDER_SITE_OTHER): Admitting: Internal Medicine

## 2024-01-22 VITALS — BP 118/72 | HR 102 | Temp 98.2°F | Ht 63.0 in | Wt 205.0 lb

## 2024-01-22 DIAGNOSIS — M159 Polyosteoarthritis, unspecified: Secondary | ICD-10-CM | POA: Diagnosis not present

## 2024-01-22 DIAGNOSIS — M25541 Pain in joints of right hand: Secondary | ICD-10-CM

## 2024-01-22 DIAGNOSIS — I484 Atypical atrial flutter: Secondary | ICD-10-CM

## 2024-01-22 MED ORDER — GABAPENTIN 100 MG PO CAPS: 100.0000 mg | ORAL_CAPSULE | Freq: Three times a day (TID) | ORAL | 3 refills | Status: DC

## 2024-01-22 NOTE — Assessment & Plan Note (Addendum)
 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

## 2024-01-22 NOTE — Patient Instructions (Addendum)
       Medications changes include :   restart gabapentin  100 mg three times a day.   Continue tylenol .      A referral was ordered for Cardiology and someone will call you to schedule an appointment.

## 2024-01-24 ENCOUNTER — Ambulatory Visit (INDEPENDENT_AMBULATORY_CARE_PROVIDER_SITE_OTHER)

## 2024-01-24 ENCOUNTER — Encounter: Payer: Self-pay | Admitting: Internal Medicine

## 2024-01-24 DIAGNOSIS — M25542 Pain in joints of left hand: Secondary | ICD-10-CM | POA: Diagnosis not present

## 2024-01-24 DIAGNOSIS — M25541 Pain in joints of right hand: Secondary | ICD-10-CM

## 2024-01-31 ENCOUNTER — Telehealth: Payer: Self-pay | Admitting: Cardiology

## 2024-01-31 NOTE — Telephone Encounter (Signed)
 Patient wants to switch to a new cardiologist. She switched from Dr. Micael Adas to Dr. Loetta Ringer but never followed up with Dr. Loetta Ringer. OK for patient to see Dr. Filiberto Hug?

## 2024-02-03 ENCOUNTER — Other Ambulatory Visit: Payer: Self-pay | Admitting: Internal Medicine

## 2024-02-28 ENCOUNTER — Other Ambulatory Visit: Payer: Self-pay | Admitting: Internal Medicine

## 2024-03-03 NOTE — Progress Notes (Signed)
 [facility] [address] [place] [address] Phone: [phone]   Assessment and Plan:     There are no diagnoses linked to this encounter.  ***   Pertinent previous records reviewed include ***    Follow Up: ***     Subjective:    Chief Complaint: right thumb pain  HPI:   10/23/2023 Saw Dr. Felipe Horton for right foot pain.  03/03/24 Patient states that right thumb is locked up.  Duration? Did you have an Injury to cause this pain? Taking Medication for pain? Numbness or Tingling? Does the pain Radiate?  Altered gait or use? ROM/ impairment of movement?   Relevant Historical Information: ***  Additional pertinent review of systems negative.   Current Outpatient Medications:    albuterol  (VENTOLIN  HFA) 108 (90 Base) MCG/ACT inhaler, Inhale 2 puffs into the lungs every 6 (six) hours as needed for wheezing or shortness of breath., Disp: 8 g, Rfl: 5   ALPRAZolam  (XANAX ) 0.5 MG tablet, Take 1 tablet (0.5 mg total) by mouth 2 (two) times daily as needed for anxiety or sleep., Disp: 30 tablet, Rfl: 0   Azelastine  HCl 137 MCG/SPRAY SOLN, PLACE 2 SPRAYS INTO BOTH NOSTRILS 2 (TWO) TIMES DAILY. USE IN EACH NOSTRIL AS DIRECTED (Patient taking differently: Place 2 sprays into the nose 2 (two) times daily.), Disp: 30 mL, Rfl: 2   b complex vitamins tablet, Take 1 tablet by mouth daily., Disp: , Rfl:    budesonide -formoterol  (SYMBICORT ) 160-4.5 MCG/ACT inhaler, Inhale 2 puffs into the lungs 2 (two) times daily., Disp: 10.2 g, Rfl: 5   Cholecalciferol (VITAMIN D3) 50 MCG (2000 UT) TABS, Take 1 tablet by mouth daily., Disp: , Rfl:    ferrous gluconate  (FERGON) 324 MG tablet, Take 1 tablet (324 mg total) by mouth 2 (two) times daily with a meal. (Patient taking differently: Take 324 mg by mouth 3 (three) times a week. PER PT TAKES MON/ WED/ FRIDAY TWICE DAILY), Disp: 180 tablet, Rfl: 3   fluconazole  (DIFLUCAN ) 100 MG tablet, Take 1  tablet (100 mg total) by mouth daily., Disp: 5 tablet, Rfl: 0   fluticasone  (FLONASE ) 50 MCG/ACT nasal spray, SHAKE LIQUID AND USE 2 SPRAYS IN EACH NOSTRIL DAILY, Disp: 48 mL, Rfl: 2   gabapentin  (NEURONTIN ) 100 MG capsule, Take 1 capsule (100 mg total) by mouth 3 (three) times daily., Disp: 270 capsule, Rfl: 3   hydrochlorothiazide  (HYDRODIURIL ) 25 MG tablet, TAKE 1 TABLET (25 MG TOTAL) BY MOUTH DAILY., Disp: 90 tablet, Rfl: 1   ipratropium (ATROVENT ) 0.06 % nasal spray, Place 2 sprays into both nostrils 4 (four) times daily as needed (nasal congestion/drainage)., Disp: 15 mL, Rfl: 5   LINZESS  145 MCG CAPS capsule, TAKE 1 CAPSULE BY MOUTH EVERY DAY BEFORE BREAKFAST, Disp: 90 capsule, Rfl: 1   loratadine  (CLARITIN ) 10 MG tablet, Take 10 mg by mouth daily as needed for itching, rhinitis or allergies., Disp: , Rfl:    losartan  (COZAAR ) 50 MG tablet, TAKE 1 TABLET BY MOUTH EVERY DAY, Disp: 90 tablet, Rfl: 3   metoprolol  tartrate (LOPRESSOR ) 50 MG tablet, TAKE 1 TABLET BY MOUTH TWICE A DAY, Disp: 180 tablet, Rfl: 2   pantoprazole  (PROTONIX ) 40 MG tablet, TAKE 1 TABLET BY MOUTH EVERY DAY, Disp: 90 tablet, Rfl: 1   potassium chloride  (KLOR-CON  M) 10 MEQ tablet, TAKE 4 TABLETS BY MOUTH EVERY DAY, Disp: 360 tablet, Rfl: 1   Tart Cherry 1200 MG CAPS, Take 1 capsule by  mouth at bedtime., Disp: , Rfl:    TURMERIC PO, Take 1 capsule by mouth daily., Disp: , Rfl:    valACYclovir  (VALTREX ) 500 MG tablet, Take 500 mg by mouth 2 (two) times daily as needed., Disp: , Rfl:    Vitamin D , Ergocalciferol , (DRISDOL ) 1.25 MG (50000 UNIT) CAPS capsule, Take 1 capsule (50,000 Units total) by mouth every 7 (seven) days., Disp: 12 capsule, Rfl: 0   XARELTO  20 MG TABS tablet, TAKE 1 TABLET(20 MG) BY MOUTH DAILY WITH SUPPER, Disp: 90 tablet, Rfl: 1   Objective:     There were no vitals filed for this visit.    There is no height or weight on file to calculate BMI.    Physical Exam:    ***   Electronically signed by:   Marshall Skeeter [facility] 8:38 AM 03/03/24

## 2024-03-04 ENCOUNTER — Ambulatory Visit (INDEPENDENT_AMBULATORY_CARE_PROVIDER_SITE_OTHER): Admitting: Sports Medicine

## 2024-03-04 VITALS — BP 118/60 | HR 74 | Ht 63.0 in | Wt 206.0 lb

## 2024-03-04 DIAGNOSIS — M79641 Pain in right hand: Secondary | ICD-10-CM

## 2024-03-04 DIAGNOSIS — M19041 Primary osteoarthritis, right hand: Secondary | ICD-10-CM

## 2024-03-04 DIAGNOSIS — M65311 Trigger thumb, right thumb: Secondary | ICD-10-CM | POA: Diagnosis not present

## 2024-03-04 DIAGNOSIS — M65351 Trigger finger, right little finger: Secondary | ICD-10-CM | POA: Diagnosis not present

## 2024-03-04 MED ORDER — METHYLPREDNISOLONE 4 MG PO TBPK: ORAL_TABLET | ORAL | 0 refills | Status: DC

## 2024-03-04 NOTE — Patient Instructions (Addendum)
 Good to see you  Injections in right hand today   Continue conservative treatment including warm hand baths Tylenol  [phone] mg 2-3 times a day for pain relief Topical Voltaren  gel over areas of pain   Prednisone  dose pak sent to pharmacy do not start till the weekend and only if the pain persists   Follow up as needed

## 2024-03-10 ENCOUNTER — Ambulatory Visit: Attending: Cardiology | Admitting: Cardiology

## 2024-03-10 DIAGNOSIS — E782 Mixed hyperlipidemia: Secondary | ICD-10-CM | POA: Insufficient documentation

## 2024-03-10 NOTE — Progress Notes (Deleted)
  Cardiology Office Note:  .   Date:  03/10/2024  ID:  [name] [name], DOB 04/06/1961, MRN [id number] PCP: Geofm Glade PARAS, MD  [name] [name] Cardiologist:  Newman Lawrence, MD PCP: Geofm Glade PARAS, MD  No chief complaint on file.    [name] [name] is a 63 y.o. female with hypertension, hyperlipidemia, OSA  History of Present Illness  Patient was previously seeing Dr. Shlomo, last seen in 09/2022.  She requested for her to switch, therefore seeing me today.  ***     There were no vitals filed for this visit.    ROS      Studies Reviewed: .        *** Labs 11/2023: Chol 213, TG 264, HDL 47, LDL 112 HbA1C 5.7% Hb 13.1 Cr 0.85 ***  Echocardiogram 2020:  1. The left ventricle has normal systolic function with an ejection  fraction of 60-65%. The cavity size was normal. Left ventricular diastolic  Doppler parameters are consistent with impaired relaxation.   2. The right ventricle has normal systolic function. The cavity was  normal. There is no increase in right ventricular wall thickness.   3. No evidence of mitral valve stenosis.   4. No stenosis of the aortic valve.   5. The aortic root and ascending aorta are normal in size and structure.   6. The interatrial septum was not assessed.   Monitor 2020: Sinus bradycardia, normal sinus rhythm and sinus tachycardia. The average heart rate was 84bpm and ranged from 59 to 152bpm. Occasional PACs and atrial couplets. Occasional PVCs, ventricular couplets and triplets.      Risk Assessment/Calculations:   {Does this patient have ATRIAL FIBRILLATION?:[phone]}    Physical Exam   VISIT DIAGNOSES: No diagnosis found.   [name] [name] is a 63 y.o. female with hypertension, hyperlipidemia, OSA Assessment & Plan  Hypertension: ***  Mixed hyperlipidemia: ***  OSA: Mild OSA, intolerant to CPAP due to sinus issues. ***     {Are you ordering a CV Procedure (e.g. stress  test, cath, DCCV, TEE, etc)?   Press F2        :[id number]}    No orders of the defined types were placed in this encounter.    F/u in ***  Signed, Newman PARAS Lawrence, MD

## 2024-03-11 ENCOUNTER — Encounter: Payer: Self-pay | Admitting: Cardiology

## 2024-03-26 ENCOUNTER — Encounter: Payer: Self-pay | Admitting: Cardiology

## 2024-03-26 ENCOUNTER — Ambulatory Visit: Attending: Cardiology | Admitting: Cardiology

## 2024-03-26 VITALS — BP 133/72 | HR 80 | Ht 63.0 in | Wt 201.0 lb

## 2024-03-26 DIAGNOSIS — R002 Palpitations: Secondary | ICD-10-CM

## 2024-03-26 DIAGNOSIS — I484 Atypical atrial flutter: Secondary | ICD-10-CM

## 2024-03-26 DIAGNOSIS — E782 Mixed hyperlipidemia: Secondary | ICD-10-CM

## 2024-03-26 DIAGNOSIS — I10 Essential (primary) hypertension: Secondary | ICD-10-CM

## 2024-03-26 DIAGNOSIS — R9431 Abnormal electrocardiogram [ECG] [EKG]: Secondary | ICD-10-CM | POA: Diagnosis not present

## 2024-03-26 NOTE — Patient Instructions (Addendum)
 Testing/Procedures: 30 Day Heart Monitor Your physician has recommended that you wear an event monitor. Event monitors are medical devices that record the heart's electrical activity. Doctors most often us  these monitors to diagnose arrhythmias. Arrhythmias are problems with the speed or rhythm of the heartbeat. The monitor is a small, portable device. You can wear one while you do your normal daily activities. This is usually used to diagnose what is causing palpitations/syncope (passing out).   ECHOCARDIOGRAM Your physician has requested that you have an echocardiogram. Echocardiography is a painless test that uses sound waves to create images of your heart. It provides your doctor with information about the size and shape of your heart and how well your heart's chambers and valves are working. This procedure takes approximately one hour. There are no restrictions for this procedure. Please do NOT wear cologne, perfume, aftershave, or lotions (deodorant is allowed). Please arrive 15 minutes prior to your appointment time.  Please note: We ask at that you not bring children with you during ultrasound (echo/ vascular) testing. Due to room size and safety concerns, children are not allowed in the ultrasound rooms during exams. Our front office staff cannot provide observation of children in our lobby area while testing is being conducted. An adult accompanying a patient to their appointment will only be allowed in the ultrasound room at the discretion of the ultrasound technician under special circumstances. We apologize for any inconvenience.   Follow-Up: At [facility] you and your health needs are our priority.  As part of our continuing mission to provide you with exceptional heart care, our providers are all part of one team.  This team includes your primary Cardiologist (physician) and Advanced Practice Providers or APPs (Physician Assistants and Nurse Practitioners) who all work together  to provide you with the care you need, when you need it.  Your next appointment:   6 months  Provider:   Newman JINNY Lawrence, MD    We recommend signing up for the patient portal called MyChart.  Sign up information is provided on this After Visit Summary.  MyChart is used to connect with patients for Virtual Visits (Telemedicine).  Patients are able to view lab/test results, encounter notes, upcoming appointments, etc.  Non-urgent messages can be sent to your provider as well.   To learn more about what you can do with MyChart, go to [email or web address].   Other Instructions Diet & Lifestyle recommendations:  Physical activity recommendation (The Physical Activity Guidelines for Americans. JAMA 2018;Nov 12) At least 150-300 minutes a week of moderate-intensity, or 75-150 minutes a week of vigorous-intensity aerobic physical activity, or an equivalent combination of moderate- and vigorous-intensity aerobic activity. Adults should perform muscle-strengthening activities on 2 or more days a week. Older adults should do multicomponent physical activity that includes balance training as well as aerobic and muscle-strengthening activities. Benefits of increased physical activity include lower risk of mortality including cardiovascular mortality, lower risk of cardiovascular events and associated risk factors (hypertension and diabetes), and lower risk of many cancers (including bladder, breast, colon, endometrium, esophagus, kidney, lung, and stomach). Additional improvments have been seen in cognition, risk of dementia, anxiety and depression, improved bone health, lower risk of falls, and associated injuries.  Dietary recommendation The 2019 ACC/AHA guidelines promote nutrition as a main fixture of cardiovascular wellness, with a recommendation for a varied diet of fruit, vegetables, fish, legumes, and whole grains (Class I), as well as recommendations to reduce sodium, cholesterol, processed  meats, and refined sugars (Class  IIa recommendation).10 Sodium intake, a topic of some controversy as of late, is recommended to be kept at 1,500 mg/day or less, far below the average daily intake in the [place]  of 3,409 mg/day, and notably below that of previous [place]  recommendations for 300mg /day.10,11 For those unable to reach 1,500 mg/day, they recommend at least a reduction of 1000 mg/day.  A Pesco-Mediterranean Diet With Intermittent Fasting: JACC Review Topic of the Week. J Am Coll Cardiol 2020;76:1484-1493 Pesco-Mediterranean diet, it is supplemented with extra-virgin olive oil (EVOO), which is the principle fat source, along with moderate amounts of dairy (particularly yogurt and cheese) and eggs, as well as modest amounts of alcohol consumption (ideally red wine with the evening meal), but few red and processed meats.

## 2024-03-26 NOTE — Progress Notes (Signed)
 Cardiology Office Note:  .   Date:  03/26/2024  ID:  [name] [name], DOB [birth year], MRN [id number] PCP: Geofm Glade PARAS, MD  [facility] Cardiologist:  Newman Lawrence, MD PCP: Geofm Glade PARAS, MD  Chief Complaint  Patient presents with   Atrial Flutter   Palpitations     [name] [name] is a 63 y.o. female with hypertension, hyperlipidemia, obesity, reported h/o atrial flutter, OSA  History of Present Illness   Patient was previously followed Dr. Shlomo, now switching care to me.  She lives by herself, trying to increase physical activity with walking 1 to 2 miles 3 days a week without any complains of chest pain shortness of breath.  She has been on Xarelto  since 2020.  I performed extensive chart review to find out when she was started on Xarelto .  It appears that this was based on an EKG performed by her PCP on 02/28/2019 was deemed as atrial flutter.  Stated EKG was found in media, attached below.  Patient has only occasional palpitations when she drinks sparkling water, last occurred 2 days 3 months ago.  Patient has mild OSA, does not use CPAP, but is trying to lose weight    Vitals:   03/26/24 0756  BP: 133/72  Pulse: 80  SpO2: 97%      Review of Systems  Cardiovascular:  Positive for palpitations (Only occasional). Negative for chest pain, dyspnea on exertion, leg swelling and syncope.        Studies Reviewed: SABRA    EKG 03/26/2024: Normal sinus rhythm Anterior infarct , age undetermined When compared with ECG of 11-May-2015 08:59, Criteria for Anterior infarct is now Present Nonspecific T wave abnormality has replaced inverted T waves in Inferior leads T wave inversion now evident in Anterior leads    Echocardiogram 2020:  1. The left ventricle has normal systolic function with an ejection  fraction of 60-65%. The cavity size was normal. Left ventricular diastolic  Doppler parameters are consistent with impaired relaxation.   2. The  right ventricle has normal systolic function. The cavity was  normal. There is no increase in right ventricular wall thickness.   3. No evidence of mitral valve stenosis.   4. No stenosis of the aortic valve.   5. The aortic root and ascending aorta are normal in size and structure.   6. The interatrial septum was not assessed.     My read: Sinus tachycardia 115 bpm  Labs 11/2023: Chol 213, TG 264, HDL 47, LDL 112 HbA1C 5.7% Hb 13.1 Cr 0.8 TSH 2.6  Risk Assessment/Calculations:    CHA2DS2-VASc Score = 2  This indicates a 2.2% annual risk of stroke. The patient's score is based upon: CHF History: 0 HTN History: 1 Diabetes History: 0 Stroke History: 0 Vascular Disease History: 0 Age Score: 0 Gender Score: 1        Physical Exam Vitals and nursing note reviewed.  Constitutional:      General: She is not in acute distress.    Appearance: She is obese.  Neck:     Vascular: No JVD.  Cardiovascular:     Rate and Rhythm: Normal rate and regular rhythm.     Heart sounds: Normal heart sounds. No murmur heard. Pulmonary:     Effort: Pulmonary effort is normal.     Breath sounds: Normal breath sounds. No wheezing or rales.  Musculoskeletal:     Right lower leg: No edema.     Left lower leg: No edema.  VISIT DIAGNOSES:   ICD-10-CM   1. Atypical atrial flutter (HCC)  I48.4 EKG 12-Lead    2. Essential hypertension  I10 EKG 12-Lead    3. Palpitations  R00.2 CARDIAC EVENT MONITOR    4. Abnormal EKG  R94.31 ECHOCARDIOGRAM COMPLETE       [name] [name] is a 63 y.o. female with hypertension, hyperlipidemia, obesity, reported h/o atrial flutter, OSA Assessment & Plan  Reported atrial flutter: Diagnosed based on EKG on 03/07/2021 performed at PCP office.  I reviewed the EKG.  I am not convinced that this was atrial flutter, looks sinus tachycardia to me.  I do not have any other EKG or telemetry evidence of A-fib or flutter.  That said, she has been on Xarelto   for last 5 years.  I will place her on 30-day monitor to ensure there is indeed no atrial for flutter, as patient has had at least some palpitations 2 to 3 months ago, when she was drinking sparkling water.  If there is no A-fib/flutter on her monitor, she does not need anticoagulation.  Even if she were to have atrial flutter, CHA2DS2VASc score is only 2 and she could suffice with only 4 weeks of anticoagulation, if cardioversion is needed.  Overall, is a good likelihood that she will come off anticoagulation after upcoming 30-day event monitor.  Abnormal EKG: Today's EKG shows anterior Q waves and T wave inversion that were not present on EKG in 05/2023.  Patient has not had any significant history of chest pain to suggest any prior MI.  I will obtain echocardiogram to ensure no wall motion abnormality or low EF.  In addition, I will also obtain CT cardiac scoring scan given her hyperlipidemia.  If no significant calcification on CT cardiac scoring scan and echocardiogram normal, it is unlikely that she would have had a prior MI and would not need aspirin.  CT cardiac scoring scan loss of the present risk stratification to decide on need for statin.  Obesity/OSA: BMI 35, mild OSA, currently not on CPAP.  She wants to lose weight and avoid CPAP if possible.  Discussed diet and lifestyle modification, especially heart healthy diet, regular aerobic activity, as well as avoiding late night snacks which seems to be a big point.  I gave patient weight loss goal of 7.5%.  I will reassess her in 6 months.  If no active cardiac issues at that time, I will then see her as needed.    F/u in 6 months  I spent 45 minutes in the care of [name] [name] today including extensive chart review of records between 2020 and 2025 to look for evidence of A-fib/flutter, review of EKG from 02/28/2019, discussing risks and benefits of anticoagulation, as well as aspirin, discussing additional risk stratification testing  including calcium score scan, and documenting in the encounter.   Signed, Newman JINNY Lawrence, MD

## 2024-03-26 NOTE — Addendum Note (Signed)
 Addended by: MANDA BOTTCHER B on: 03/26/2024 09:58 AM   Modules accepted: Orders

## 2024-03-27 ENCOUNTER — Encounter: Payer: Self-pay | Admitting: Cardiology

## 2024-03-31 ENCOUNTER — Encounter: Payer: Self-pay | Admitting: Internal Medicine

## 2024-03-31 ENCOUNTER — Other Ambulatory Visit: Payer: Self-pay | Admitting: Internal Medicine

## 2024-03-31 MED ORDER — ALPRAZOLAM 0.5 MG PO TABS: 0.5000 mg | ORAL_TABLET | Freq: Every day | ORAL | 0 refills | Status: DC | PRN

## 2024-04-10 ENCOUNTER — Other Ambulatory Visit (HOSPITAL_BASED_OUTPATIENT_CLINIC_OR_DEPARTMENT_OTHER)

## 2024-04-10 ENCOUNTER — Other Ambulatory Visit: Payer: Self-pay | Admitting: Internal Medicine

## 2024-04-19 ENCOUNTER — Other Ambulatory Visit: Payer: Self-pay | Admitting: Internal Medicine

## 2024-04-27 ENCOUNTER — Other Ambulatory Visit: Payer: Self-pay | Admitting: Internal Medicine

## 2024-04-30 NOTE — Patient Instructions (Addendum)
 [name] [name]

## 2024-04-30 NOTE — Progress Notes (Signed)
 Subjective:    Patient ID: [name] [name], female    DOB: 01/29/1961, 63 y.o.   MRN: [id number]      HPI [name] [name] is here for No chief complaint on file.   She is concerned about her weight and has not been able to lose weight.  She is not sure if she really wants medication or not.  She has been working with a weight loss group and has some made some changes in her diet that has been helpful.  She is walking 2-3 miles every other day.    Recently saw her gynecologist.  She has recurrent postmenopausal bleeding going on for 2 years.  She had an endometrial biopsy done.    Medications and allergies reviewed with patient and updated if appropriate.  Current Outpatient Medications on File Prior to Visit  Medication Sig Dispense Refill   albuterol  (VENTOLIN  HFA) 108 (90 Base) MCG/ACT inhaler Inhale 2 puffs into the lungs every 6 (six) hours as needed for wheezing or shortness of breath. 8 g 5   Azelastine  HCl 137 MCG/SPRAY SOLN PLACE 2 SPRAYS INTO BOTH NOSTRILS 2 (TWO) TIMES DAILY. USE IN EACH NOSTRIL AS DIRECTED (Patient taking differently: Place 2 sprays into the nose 2 (two) times daily.) 30 mL 2   b complex vitamins tablet Take 1 tablet by mouth daily.     budesonide -formoterol  (SYMBICORT ) 160-4.5 MCG/ACT inhaler Inhale 2 puffs into the lungs 2 (two) times daily. 10.2 g 5   Cholecalciferol (VITAMIN D3) 50 MCG (2000 UT) TABS Take 1 tablet by mouth daily.     ferrous gluconate  (FERGON) 324 MG tablet Take 1 tablet (324 mg total) by mouth 2 (two) times daily with a meal. (Patient taking differently: Take 324 mg by mouth 3 (three) times a week. PER PT TAKES MON/ WED/ FRIDAY TWICE DAILY) 180 tablet 3   fluconazole  (DIFLUCAN ) 100 MG tablet Take 1 tablet (100 mg total) by mouth daily. 5 tablet 0   fluticasone  (FLONASE ) 50 MCG/ACT nasal spray SHAKE LIQUID AND USE 2 SPRAYS IN EACH NOSTRIL DAILY 48 mL 2   gabapentin  (NEURONTIN ) 100 MG capsule Take 1 capsule (100 mg total) by mouth 3  (three) times daily. 270 capsule 3   hydrochlorothiazide  (HYDRODIURIL ) 25 MG tablet TAKE 1 TABLET (25 MG TOTAL) BY MOUTH DAILY. 90 tablet 1   ipratropium (ATROVENT ) 0.06 % nasal spray Place 2 sprays into both nostrils 4 (four) times daily as needed (nasal congestion/drainage). 15 mL 5   LINZESS  145 MCG CAPS capsule TAKE 1 CAPSULE BY MOUTH EVERY DAY BEFORE BREAKFAST 90 capsule 1   loratadine  (CLARITIN ) 10 MG tablet Take 10 mg by mouth daily as needed for itching, rhinitis or allergies.     losartan  (COZAAR ) 50 MG tablet TAKE 1 TABLET BY MOUTH EVERY DAY 90 tablet 3   metoprolol  tartrate (LOPRESSOR ) 50 MG tablet TAKE 1 TABLET BY MOUTH TWICE A DAY 180 tablet 2   pantoprazole  (PROTONIX ) 40 MG tablet TAKE 1 TABLET BY MOUTH EVERY DAY 90 tablet 1   potassium chloride  (KLOR-CON  M) 10 MEQ tablet TAKE 4 TABLETS BY MOUTH EVERY DAY 360 tablet 1   Tart Cherry 1200 MG CAPS Take 1 capsule by mouth at bedtime.     TURMERIC PO Take 1 capsule by mouth daily.     valACYclovir  (VALTREX ) 500 MG tablet Take 500 mg by mouth 2 (two) times daily as needed.     Vitamin D , Ergocalciferol , (DRISDOL ) 1.25 MG (50000 UNIT) CAPS capsule Take 1  capsule (50,000 Units total) by mouth every 7 (seven) days. 12 capsule 0   XARELTO  20 MG TABS tablet TAKE 1 TABLET(20 MG) BY MOUTH DAILY WITH SUPPER 90 tablet 1   No current facility-administered medications on file prior to visit.    Review of Systems     Objective:   Vitals:   05/01/24 0749  BP: 120/80  Pulse: 68  Temp: 98 F (36.7 C)  SpO2: 97%   BP Readings from Last 3 Encounters:  05/01/24 120/80  03/26/24 133/72  03/04/24 118/60   Wt Readings from Last 3 Encounters:  05/01/24 202 lb (91.6 kg)  03/26/24 201 lb (91.2 kg)  03/04/24 206 lb (93.4 kg)   Body mass index is 35.78 kg/m.    Physical Exam Constitutional:      General: She is not in acute distress.    Appearance: Normal appearance. She is not ill-appearing.  HENT:     Head: Normocephalic and  atraumatic.  Skin:    General: Skin is warm and dry.  Neurological:     Mental Status: She is alert. Mental status is at baseline.  Psychiatric:        Mood and Affect: Mood normal.        Behavior: Behavior normal.        Thought Content: Thought content normal.        Judgment: Judgment normal.            Assessment & Plan:    See Problem List for Assessment and Plan of chronic medical problems.

## 2024-05-01 ENCOUNTER — Ambulatory Visit (INDEPENDENT_AMBULATORY_CARE_PROVIDER_SITE_OTHER): Admitting: Internal Medicine

## 2024-05-01 ENCOUNTER — Encounter: Payer: Self-pay | Admitting: Internal Medicine

## 2024-05-01 DIAGNOSIS — F419 Anxiety disorder, unspecified: Secondary | ICD-10-CM

## 2024-05-01 DIAGNOSIS — Z6835 Body mass index (BMI) 35.0-35.9, adult: Secondary | ICD-10-CM | POA: Diagnosis not present

## 2024-05-01 MED ORDER — PHENTERMINE HCL 15 MG PO CAPS: 15.0000 mg | ORAL_CAPSULE | ORAL | 0 refills | Status: DC

## 2024-05-01 MED ORDER — ALPRAZOLAM 0.5 MG PO TABS: 0.5000 mg | ORAL_TABLET | Freq: Every day | ORAL | 0 refills | Status: AC | PRN

## 2024-05-01 NOTE — Assessment & Plan Note (Signed)
 Chronic controlled Continue Xanax  0.5 mg bid prn

## 2024-05-01 NOTE — Assessment & Plan Note (Signed)
 Chronic Bmi 35.78 with htn, hld, gerd, prediabetes Wants to lose weight - not sure what she needs to help her She is walking 2-3 miles every other day - stressed to continue to exercise regularly - increase if she can - the more the better.  Encouraged adding in resistance training Discussed diet - goal is about 1200 cal - lean protein, veges and good fiber should be the majority of what she eats Advised to drink lots of fluids Stressed the only thing that works long term is lifestyle Does not want GLP-1, not sure about other meds Will try phentermine  15 mg daily x 3 months to see if that helps burst her efforts

## 2024-05-02 ENCOUNTER — Ambulatory Visit (HOSPITAL_COMMUNITY)
Admission: RE | Admit: 2024-05-02 | Discharge: 2024-05-02 | Disposition: A | Discharge status: Home / Self Care | Source: Ambulatory Visit | Attending: Cardiology | Admitting: Cardiology

## 2024-05-02 DIAGNOSIS — R9431 Abnormal electrocardiogram [ECG] [EKG]: Secondary | ICD-10-CM | POA: Diagnosis present

## 2024-05-02 LAB — ECHOCARDIOGRAM COMPLETE
Area-P 1/2: 3.21 cm2
S' Lateral: 3.2 cm

## 2024-05-06 ENCOUNTER — Other Ambulatory Visit (HOSPITAL_COMMUNITY)

## 2024-05-06 ENCOUNTER — Ambulatory Visit: Attending: Cardiology

## 2024-05-06 DIAGNOSIS — R002 Palpitations: Secondary | ICD-10-CM

## 2024-05-07 ENCOUNTER — Ambulatory Visit: Payer: Self-pay | Admitting: Cardiology

## 2024-05-16 DIAGNOSIS — R002 Palpitations: Secondary | ICD-10-CM

## 2024-05-20 ENCOUNTER — Encounter: Payer: Self-pay | Admitting: Internal Medicine

## 2024-05-20 NOTE — Progress Notes (Unsigned)
 Virtual Visit via Video Note  I connected with [name] [name] on 05/20/24 at  1:40 PM EDT by a video enabled telemedicine application and verified that I am speaking with the correct person using two identifiers.   I discussed the limitations of evaluation and management by telemedicine and the availability of in person appointments. The patient expressed understanding and agreed to proceed.  Present for the visit:  Myself, Dr [name] [name], [name] [name].  The patient is currently at home and I am in the office.    No referring provider.    History of Present Illness: She is here for an acute visit for cold symptoms.   Her symptoms started   She is experiencing nasal congestion, postnasal drainage, sinus pain, sore throat, occasional cough, occasional wheeze and headaches.  She denies any fever or shortness of breath.  She does have a history of sinus infections.  She has tried taking azelastine  nasal spray, flonase , claritin , ginger/lemon/honey, albuterol    Review of Systems  Constitutional:  Negative for fever.  HENT:  Positive for congestion (with PND), sinus pain and sore throat (improved). Negative for ear pain.   Respiratory:  Positive for cough (occ) and wheezing. Negative for shortness of breath.   Neurological:  Positive for headaches. Negative for dizziness.      Social History   Socioeconomic History   Marital status: Single    Spouse name: Not on file   Number of children: 2   Years of education: 12   Highest education level: Not on file  Occupational History   Occupation: Engineer, drilling: [employer]  Tobacco Use   Smoking status: Never   Smokeless tobacco: Never  Vaping Use   Vaping status: Never Used  Substance and Sexual Activity   Alcohol use: No    Alcohol/week: 0.0 standard drinks of alcohol   Drug use: Never   Sexual activity: Not on file  Other Topics Concern   Not on file  Social History Narrative   Patient is a  Administrator, 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    Patient is single and lives alone.    Patient is right handed.    Social Drivers of Health   Financial Resource Strain: Low Risk  (04/30/2024)   Overall Financial Resource Strain (CARDIA)    Difficulty of Paying Living Expenses: Not very hard  Food Insecurity: Patient Declined (04/30/2024)   Hunger Vital Sign    Worried About Running Out of Food in the Last Year: Patient declined    Ran Out of Food in the Last Year: Patient declined  Transportation Needs: Patient Declined (04/30/2024)   PRAPARE - Administrator, Civil Service (Medical): Patient declined    Lack of Transportation (Non-Medical): Patient declined  Physical Activity: Insufficiently Active (04/30/2024)   Exercise Vital Sign    Days of Exercise per Week: 3 days    Minutes of Exercise per Session: 40 min  Stress: No Stress Concern Present (04/30/2024)   [employer] of Occupational Health - Occupational Stress Questionnaire    Feeling of Stress: Only a little  Social Connections: Moderately Integrated (04/30/2024)   Social Connection and Isolation Panel    Frequency of Communication with Friends and Family: More than three times a week    Frequency of Social Gatherings with Friends and Family: More than three times a week    Attends Religious Services: More than 4 times per year  Active Member of Clubs or Organizations: Yes    Attends Banker Meetings: More than 4 times per year    Marital Status: Never married     Observations/Objective: Appears well in NAD Breathing normally  Skin is warm and dry  Assessment and Plan:  See Problem List for Assessment and Plan of chronic medical problems.   Follow Up Instructions:    I discussed the assessment and treatment plan with the patient. The patient was provided an opportunity to ask questions and all were answered. The patient agreed with the plan and  demonstrated an understanding of the instructions.   The patient was advised to call back or seek an in-person evaluation if the symptoms worsen or if the condition fails to improve as anticipated.    [name] JINNY Hope, MD

## 2024-05-21 ENCOUNTER — Telehealth (INDEPENDENT_AMBULATORY_CARE_PROVIDER_SITE_OTHER): Admitting: Internal Medicine

## 2024-05-21 DIAGNOSIS — J0100 Acute maxillary sinusitis, unspecified: Secondary | ICD-10-CM

## 2024-05-21 MED ORDER — AZITHROMYCIN 250 MG PO TABS
ORAL_TABLET | ORAL | 0 refills | Status: DC
Start: 2024-05-21 — End: 2024-08-06

## 2024-05-21 NOTE — Assessment & Plan Note (Signed)
 Acute Likely bacterial given history of sinus infections a couple of times a year Start zpak otc cold medications Rest, fluid Call if no improvement

## 2024-05-27 ENCOUNTER — Other Ambulatory Visit (HOSPITAL_COMMUNITY): Payer: Self-pay

## 2024-06-17 ENCOUNTER — Ambulatory Visit: Admitting: Dermatology

## 2024-07-06 ENCOUNTER — Other Ambulatory Visit: Payer: Self-pay | Admitting: Internal Medicine

## 2024-07-09 ENCOUNTER — Ambulatory Visit: Payer: BC Managed Care – PPO | Admitting: Neurology

## 2024-07-27 ENCOUNTER — Other Ambulatory Visit: Payer: Self-pay | Admitting: Internal Medicine

## 2024-08-05 NOTE — Progress Notes (Unsigned)
 [name] [address] [place] [address] Phone: [phone] Subjective:   [name], am serving as a scribe for Dr. Arthea Claudene.  I'm seeing this patient by the request  of:  Geofm Glade PARAS, MD  CC:  right pain in the right thigh.  YEP:Dlagzrupcz  [name] [name] is a 63 y.o. female coming in with complaint of R thigh pain. Last seen in Feb 2025 for R foot pain. Patient states pain in R hip when standing for too long. Radiating down the hip, but numb on lateral leg. B achilles pain. previous x-rays in 2024 showed the lumbar spine facet arthropathy most prominent at the L4-L5 with progression since 2018.    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   Chronic constipation    Chronic fatigue 06/08/2016   Conjunctiva disorder 08/06/2018   Depression 09/03/2016   GAD (generalized anxiety disorder)    GERD    Herpes zoster without complication 12/11/2016   History of angioedema    ACE inhibitors   Hx of adenomatous colonic polyps 05/2014   Hyperlipidemia    Hypertension    Lactose intolerance    Migraine headache    Multiple sclerosis, relapsing-remitting 11/03/2015   neurologist--- dr shirlee   Personal history of chemotherapy    PMB (postmenopausal bleeding)    Sleep apnea    Past Surgical History:  Procedure Laterality Date   BREAST BIOPSY Right 2016   x4   BREAST BIOPSY Left 03/09/2023   BREAST BIOPSY Left 03/09/2023   US  LT BREAST BX W LOC DEV 1ST LESION IMG BX SPEC US  GUIDE 03/09/2023 [facility]   COLONOSCOPY  2018   DILATATION & CURETTAGE/HYSTEROSCOPY WITH MYOSURE  N/A 09/29/2022   Procedure: DILATATION & CURETTAGE/HYSTEROSCOPY WITH MYOSURE;  Surgeon: Gretta Gums, MD;  Location: [facility];  Service: Gynecology;  Laterality: N/A;   MASTECTOMY Right 2016   PORT-A-CATH REMOVAL Left 05/13/2015   Procedure: REMOVAL PORT-A-CATH;  Surgeon: Donnice Bury, MD;  Location: [facility] OR;  Service: General;  Laterality: Left;   PORTACATH PLACEMENT Left 11/06/2014   Procedure: INSERTION PORT-A-CATH;  Surgeon: Donnice Bury, MD;  Location: [facility];  Service: General;  Laterality: Left;   SIMPLE MASTECTOMY WITH AXILLARY SENTINEL NODE BIOPSY Right 05/13/2015   Procedure: RIGHT TOTAL  MASTECTOMY WITH RIGHT  AXILLARY SENTINEL NODE BIOPSY;  Surgeon: Donnice Bury, MD;  Location: [facility] OR;  Service: General;  Laterality: Right;   TUBAL LIGATION     yrs ago   Social History   Socioeconomic History   Marital status: Single    Spouse name: Not on file   Number of children: 2   Years of education: 12   Highest education level: Not on file  Occupational History   Occupation: Engineer, Drilling: [employer]  Tobacco Use   Smoking status: Never   Smokeless tobacco: Never  Vaping Use   Vaping status: Never Used  Substance and Sexual Activity   Alcohol use: No    Alcohol/week: 0.0 standard drinks of alcohol   Drug use: Never   Sexual activity: Not on file  Other Topics  Concern   Not on file  Social History Narrative   Patient is a administrator, 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    Patient is single and lives alone.    Patient is right handed.    Social Drivers of Health   Financial Resource Strain: Low Risk  (04/30/2024)   Overall Financial Resource Strain (CARDIA)    Difficulty of Paying Living Expenses: Not very hard  Food Insecurity: Patient Declined (04/30/2024)   Hunger Vital Sign    Worried About Running Out of Food in the Last Year: Patient declined    Ran Out  of Food in the Last Year: Patient declined  Transportation Needs: Patient Declined (04/30/2024)   PRAPARE - Administrator, Civil Service (Medical): Patient declined    Lack of Transportation (Non-Medical): Patient declined  Physical Activity: Insufficiently Active (04/30/2024)   Exercise Vital Sign    Days of Exercise per Week: 3 days    Minutes of Exercise per Session: 40 min  Stress: No Stress Concern Present (04/30/2024)   [employer] of Occupational Health - Occupational Stress Questionnaire    Feeling of Stress: Only a little  Social Connections: Moderately Integrated (04/30/2024)   Social Connection and Isolation Panel    Frequency of Communication with Friends and Family: More than three times a week    Frequency of Social Gatherings with Friends and Family: More than three times a week    Attends Religious Services: More than 4 times per year    Active Member of [employer] or Organizations: Yes    Attends Engineer, Structural: More than 4 times per year    Marital Status: Never married   Allergies  Allergen Reactions   Ace Inhibitors Anaphylaxis     Angioedema (tongue swelling)   Clarithromycin Anaphylaxis    Throat swells   Contrave  [Naltrexone -Bupropion  Hcl Er] Shortness Of Breath    Chest and sob   Levaquin  [Levofloxacin ] Other (See Comments)    Tendon pain - shoulder and calf   Duloxetine  Other (See Comments)    Did not feel herself when taking it   Family History  Problem Relation Age of Onset   Coronary artery disease Mother    Heart disease Mother    Diabetes Mother    Hypertension Mother    Stroke Mother    Alcohol abuse Father    Colon cancer Sister    COPD Maternal Grandmother    Hypertension Other    Hyperlipidemia Other    Rectal cancer Neg Hx    Stomach cancer Neg Hx    Esophageal cancer Neg Hx    Breast cancer Neg Hx     Current Outpatient Medications (Endocrine & Metabolic):    predniSONE  (DELTASONE ) 10 MG tablet, Take 4  tabs po qd x 3 days, then 3 tabs po qd x 3 days, then 2 tabs po qd x 3 days, then 1 tab po qd x 3 days  Current Outpatient Medications (Cardiovascular):    hydrochlorothiazide  (HYDRODIURIL ) 25 MG tablet, TAKE 1 TABLET (25 MG TOTAL) BY MOUTH DAILY.   losartan  (COZAAR ) 50 MG tablet, TAKE 1 TABLET BY MOUTH EVERY DAY   metoprolol  tartrate (LOPRESSOR ) 50 MG tablet, TAKE 1 TABLET BY MOUTH TWICE A DAY  Current Outpatient Medications (Respiratory):    albuterol  (VENTOLIN  HFA) 108 (90 Base) MCG/ACT inhaler, Inhale 2 puffs into the lungs every 6 (six) hours as needed for wheezing or shortness of breath.  Azelastine  HCl 137 MCG/SPRAY SOLN, PLACE 2 SPRAYS INTO BOTH NOSTRILS 2 (TWO) TIMES DAILY. USE IN EACH NOSTRIL AS DIRECTED (Patient taking differently: Place 2 sprays into the nose 2 (two) times daily.)   budesonide -formoterol  (SYMBICORT ) 160-4.5 MCG/ACT inhaler, Inhale 2 puffs into the lungs 2 (two) times daily.   fluticasone  (FLONASE ) 50 MCG/ACT nasal spray, SHAKE LIQUID AND USE 2 SPRAYS IN EACH NOSTRIL DAILY   ipratropium (ATROVENT ) 0.06 % nasal spray, Place 2 sprays into both nostrils 4 (four) times daily as needed (nasal congestion/drainage).   loratadine  (CLARITIN ) 10 MG tablet, Take 10 mg by mouth daily as needed for itching, rhinitis or allergies.   Current Outpatient Medications (Hematological):    ferrous gluconate  (FERGON) 324 MG tablet, Take 1 tablet (324 mg total) by mouth 2 (two) times daily with a meal. (Patient taking differently: Take 324 mg by mouth 3 (three) times a week. PER PT TAKES MON/ WED/ FRIDAY TWICE DAILY)   XARELTO  20 MG TABS tablet, TAKE 1 TABLET(20 MG) BY MOUTH DAILY WITH SUPPER  Current Outpatient Medications (Other):    ALPRAZolam  (XANAX ) 0.5 MG tablet, Take 1 tablet (0.5 mg total) by mouth daily as needed for anxiety or sleep.   b complex vitamins tablet, Take 1 tablet by mouth daily.   Cholecalciferol (VITAMIN D3) 50 MCG (2000 UT) TABS, Take 1 tablet by mouth daily.    LINZESS  145 MCG CAPS capsule, TAKE 1 CAPSULE BY MOUTH EVERY DAY BEFORE BREAKFAST   pantoprazole  (PROTONIX ) 40 MG tablet, TAKE 1 TABLET BY MOUTH EVERY DAY   phentermine  15 MG capsule, Take 1 capsule (15 mg total) by mouth every morning.   potassium chloride  (KLOR-CON  M) 10 MEQ tablet, TAKE 4 TABLETS BY MOUTH EVERY DAY   Tart Cherry 1200 MG CAPS, Take 1 capsule by mouth at bedtime.   TURMERIC PO, Take 1 capsule by mouth daily.   valACYclovir  (VALTREX ) 500 MG tablet, Take 500 mg by mouth 2 (two) times daily as needed.   Vitamin D , Ergocalciferol , (DRISDOL ) 1.25 MG (50000 UNIT) CAPS capsule, Take 1 capsule (50,000 Units total) by mouth every 7 (seven) days.   Reviewed prior external information including notes and imaging from  primary care provider As well as notes that were available from care everywhere and other healthcare systems.  Past medical history, social, surgical and family history all reviewed in electronic medical record.  No pertanent information unless stated regarding to the chief complaint.   Review of Systems:  No headache, visual changes, nausea, vomiting, diarrhea, constipation, dizziness, abdominal pain, skin rash, fevers, chills, night sweats, weight loss, swollen lymph nodes, body aches, joint swelling, chest pain, shortness of breath, mood changes. POSITIVE muscle aches  Objective  Blood pressure 122/76, pulse 71, height 5' 3 (1.6 m), weight 201 lb (91.2 kg), last menstrual period 11/01/2012, SpO2 99%.   General: No apparent distress alert and oriented x3 mood and affect normal, dressed appropriately.  HEENT: Pupils equal, extraocular movements intact  Respiratory: Patient's speak in full sentences and does not appear short of breath  Cardiovascular: No lower extremity edema, non tender, no erythema  Low back exam shows mild loss of lordosis.  Tightness with straight leg test but very mild radicular symptoms.  No weakness noted.  Severe tenderness over the greater  trochanteric area.  Neurovascularly intact distally   After verbal consent patient was prepped with alcohol swab and with a 21-gauge 2 inch needle injected into the right greater trochanteric area with 2 cc of 0.5% Marcaine  and  1 cc of Kenalog  40 mg/mL.  No blood loss.  Band-Aid placed.  Postinjection instructions given    Impression and Recommendations:    The above documentation has been reviewed and is accurate and complete Aniqua Briere M Jinnifer Montejano, DO

## 2024-08-06 ENCOUNTER — Ambulatory Visit: Admitting: Internal Medicine

## 2024-08-06 ENCOUNTER — Ambulatory Visit: Admitting: Family Medicine

## 2024-08-06 ENCOUNTER — Encounter: Payer: Self-pay | Admitting: Family Medicine

## 2024-08-06 ENCOUNTER — Encounter: Payer: Self-pay | Admitting: Internal Medicine

## 2024-08-06 VITALS — BP 122/76 | HR 64 | Temp 98.1°F | Ht 63.0 in | Wt 199.0 lb

## 2024-08-06 VITALS — BP 122/76 | HR 71 | Ht 63.0 in | Wt 201.0 lb

## 2024-08-06 DIAGNOSIS — M7061 Trochanteric bursitis, right hip: Secondary | ICD-10-CM | POA: Diagnosis not present

## 2024-08-06 DIAGNOSIS — G35D Multiple sclerosis, unspecified: Secondary | ICD-10-CM | POA: Diagnosis not present

## 2024-08-06 MED ORDER — PREDNISONE 10 MG PO TABS: ORAL_TABLET | ORAL | 0 refills | Status: DC

## 2024-08-06 NOTE — Patient Instructions (Signed)
 Good to see you! Heel lifts Injection in hip today See you again in 2-3 months

## 2024-08-06 NOTE — Progress Notes (Signed)
 Subjective:    Patient ID: [name] [name], female    DOB: 07-11-1961, 62 y.o.   MRN: [id number]      HPI [name] [name] is here for  Chief Complaint  Patient presents with   Medical Management of Chronic Issues    MS flare-up   Started two weeks ago.   Hands are n/t and painful.  Pain up into foreamrs.  Discussed the use of AI scribe software for clinical note transcription with the patient, who gave verbal consent to proceed.  History of Present Illness [name] is a 63 year old female with multiple sclerosis who presents with a flare-up characterized by numbness, tingling, and pain in her arms.  She has been experiencing a flare-up of her multiple sclerosis symptoms for the past two weeks. Her hands are numb, tingling, and painful, with the sensation extending up both arms. She describes her arms as feeling 'asleep' and needing movement to alleviate the sensation. This flare-up is consistent with her previous episodes, although they are infrequent.  She has not been on any medication for her multiple sclerosis recently and has not required high-dose steroids in the past. Previous treatments have been effective in resolving her symptoms. She stopped taking gabapentin  some time ago and has not been on it recently.  No headaches, fevers, chills, changes in energy level, weakness in her arms or legs, blurry or double vision, and any other symptoms in her legs. Her energy level remains good, and she continues to work a few hours a day.  She mentions a bruise on her leg that has persisted for a month after an injury, but she does not express concern about it.       Medications and allergies reviewed with patient and updated if appropriate.  Current Outpatient Medications on File Prior to Visit  Medication Sig Dispense Refill   albuterol  (VENTOLIN  HFA) 108 (90 Base) MCG/ACT inhaler Inhale 2 puffs into the lungs every 6 (six) hours as needed for wheezing or  shortness of breath. 8 g 5   ALPRAZolam  (XANAX ) 0.5 MG tablet Take 1 tablet (0.5 mg total) by mouth daily as needed for anxiety or sleep. 30 tablet 0   Azelastine  HCl 137 MCG/SPRAY SOLN PLACE 2 SPRAYS INTO BOTH NOSTRILS 2 (TWO) TIMES DAILY. USE IN EACH NOSTRIL AS DIRECTED (Patient taking differently: Place 2 sprays into the nose 2 (two) times daily.) 30 mL 2   azithromycin  (ZITHROMAX ) 250 MG tablet Take two tabs the first day and then one tab daily for four days 6 tablet 0   b complex vitamins tablet Take 1 tablet by mouth daily.     budesonide -formoterol  (SYMBICORT ) 160-4.5 MCG/ACT inhaler Inhale 2 puffs into the lungs 2 (two) times daily. 10.2 g 5   Cholecalciferol (VITAMIN D3) 50 MCG (2000 UT) TABS Take 1 tablet by mouth daily.     ferrous gluconate  (FERGON) 324 MG tablet Take 1 tablet (324 mg total) by mouth 2 (two) times daily with a meal. (Patient taking differently: Take 324 mg by mouth 3 (three) times a week. PER PT TAKES MON/ WED/ FRIDAY TWICE DAILY) 180 tablet 3   fluconazole  (DIFLUCAN ) 100 MG tablet Take 1 tablet (100 mg total) by mouth daily. 5 tablet 0   fluticasone  (FLONASE ) 50 MCG/ACT nasal spray SHAKE LIQUID AND USE 2 SPRAYS IN EACH NOSTRIL DAILY 48 mL 2   gabapentin  (NEURONTIN ) 100 MG capsule Take 1 capsule (100 mg total) by mouth 3 (three) times daily. 270 capsule 3  hydrochlorothiazide  (HYDRODIURIL ) 25 MG tablet TAKE 1 TABLET (25 MG TOTAL) BY MOUTH DAILY. 90 tablet 1   ipratropium (ATROVENT ) 0.06 % nasal spray Place 2 sprays into both nostrils 4 (four) times daily as needed (nasal congestion/drainage). 15 mL 5   LINZESS  145 MCG CAPS capsule TAKE 1 CAPSULE BY MOUTH EVERY DAY BEFORE BREAKFAST 90 capsule 1   loratadine  (CLARITIN ) 10 MG tablet Take 10 mg by mouth daily as needed for itching, rhinitis or allergies.     losartan  (COZAAR ) 50 MG tablet TAKE 1 TABLET BY MOUTH EVERY DAY 90 tablet 3   metoprolol  tartrate (LOPRESSOR ) 50 MG tablet TAKE 1 TABLET BY MOUTH TWICE A DAY 180 tablet  2   pantoprazole  (PROTONIX ) 40 MG tablet TAKE 1 TABLET BY MOUTH EVERY DAY 90 tablet 1   phentermine  15 MG capsule Take 1 capsule (15 mg total) by mouth every morning. 90 capsule 0   potassium chloride  (KLOR-CON  M) 10 MEQ tablet TAKE 4 TABLETS BY MOUTH EVERY DAY 360 tablet 1   Tart Cherry 1200 MG CAPS Take 1 capsule by mouth at bedtime.     TURMERIC PO Take 1 capsule by mouth daily.     valACYclovir  (VALTREX ) 500 MG tablet Take 500 mg by mouth 2 (two) times daily as needed.     Vitamin D , Ergocalciferol , (DRISDOL ) 1.25 MG (50000 UNIT) CAPS capsule Take 1 capsule (50,000 Units total) by mouth every 7 (seven) days. 12 capsule 0   XARELTO  20 MG TABS tablet TAKE 1 TABLET(20 MG) BY MOUTH DAILY WITH SUPPER 90 tablet 1   No current facility-administered medications on file prior to visit.    Review of Systems  Constitutional:  Negative for chills, fatigue and fever.  Eyes:  Negative for visual disturbance.  Neurological:  Positive for numbness. Negative for weakness and headaches.       Objective:   Vitals:   08/06/24 0925  BP: 122/76  Pulse: 64  Temp: 98.1 F (36.7 C)  SpO2: 96%   BP Readings from Last 3 Encounters:  08/06/24 122/76  08/06/24 122/76  05/01/24 120/80   Wt Readings from Last 3 Encounters:  08/06/24 199 lb (90.3 kg)  08/06/24 201 lb (91.2 kg)  05/01/24 202 lb (91.6 kg)   Body mass index is 35.25 kg/m.    Physical Exam Constitutional:      General: She is not in acute distress.    Appearance: Normal appearance. She is not ill-appearing.  HENT:     Head: Normocephalic and atraumatic.  Musculoskeletal:     Right lower leg: No edema.     Left lower leg: No edema.  Skin:    General: Skin is warm and dry.  Neurological:     Mental Status: She is alert. Mental status is at baseline.     Cranial Nerves: No cranial nerve deficit.     Sensory: No sensory deficit (Normal sensation bilateral upper and lower extremities).     Motor: No weakness (Normal strength in  bilateral upper and lower extremities).  Psychiatric:        Mood and Affect: Mood normal.        Behavior: Behavior normal.        Thought Content: Thought content normal.        Judgment: Judgment normal.            Assessment & Plan:    See Problem List for Assessment and Plan of chronic medical problems.

## 2024-08-06 NOTE — Assessment & Plan Note (Signed)
 Acute Not currently on any medication for her multiple sclerosis Has occasional exacerbations-last one was a year or 2 ago, would typically respond to prednisone  taper Start prednisone  20 mg daily x 3 days, 30 mg daily x 3 days, 20 mg daily x 3 days and 10 mg daily x 3 days Not currently following with neurology

## 2024-08-06 NOTE — Patient Instructions (Addendum)
      Medications changes include :   prednisone taper      Return if symptoms worsen or fail to improve.  

## 2024-08-07 ENCOUNTER — Encounter: Payer: Self-pay | Admitting: Family Medicine

## 2024-08-12 ENCOUNTER — Encounter: Payer: Self-pay | Admitting: Cardiology

## 2024-08-12 ENCOUNTER — Encounter: Payer: Self-pay | Admitting: Internal Medicine

## 2024-08-17 ENCOUNTER — Encounter: Payer: Self-pay | Admitting: Internal Medicine

## 2024-08-17 NOTE — Progress Notes (Unsigned)
 Virtual Visit via Video Note  I connected with [name] [name] on 08/17/24 at  3:40 PM EST by a video enabled telemedicine application and verified that I am speaking with the correct person using two identifiers.   I discussed the limitations of evaluation and management by telemedicine and the availability of in person appointments. The patient expressed understanding and agreed to proceed.  Present for the visit:  Myself, Dr Glade Hope, [name] [name].  The patient is currently at home and I am in the office.    No referring provider.    History of Present Illness: This is an acute visit for diarrhea and cold symptoms.  Diarrhea started 3 days ago.  She is having multiple episodes of nonbloody, nonmelanous watery stool.  She denies any abdominal pain, nausea or fevers.  She has been drinking plenty of fluids.  She does feel fatigued.  She also states sinus pressure, PND, nasal congestion and sore throat.  She denies fever.  The symptoms started prior to diarrhea are unlikely related.  She denies any shortness of breath or cough.  She does have some intermittent wheezing which is not new for her.  Review of Systems  Constitutional:  Positive for malaise/fatigue. Negative for fever.  HENT:  Positive for congestion, sinus pain (minimal) and sore throat. Negative for ear pain.        PND  Respiratory:  Positive for wheezing. Negative for cough and shortness of breath.   Gastrointestinal:  Positive for diarrhea (multiple times a day). Negative for abdominal pain, blood in stool, melena and nausea.  Musculoskeletal:  Negative for myalgias.  Neurological:  Positive for headaches (one day).      Social History   Socioeconomic History   Marital status: Single    Spouse name: Not on file   Number of children: 2   Years of education: 12   Highest education level: Not on file  Occupational History   Occupation: Engineer, Drilling: [employer]  Tobacco Use    Smoking status: Never   Smokeless tobacco: Never  Vaping Use   Vaping status: Never Used  Substance and Sexual Activity   Alcohol use: No    Alcohol/week: 0.0 standard drinks of alcohol   Drug use: Never   Sexual activity: Not on file  Other Topics Concern   Not on file  Social History Narrative   Patient is a administrator, 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    Patient is single and lives alone.    Patient is right handed.    Social Drivers of Health   Financial Resource Strain: Low Risk  (04/30/2024)   Overall Financial Resource Strain (CARDIA)    Difficulty of Paying Living Expenses: Not very hard  Food Insecurity: Patient Declined (04/30/2024)   Hunger Vital Sign    Worried About Running Out of Food in the Last Year: Patient declined    Ran Out of Food in the Last Year: Patient declined  Transportation Needs: Patient Declined (04/30/2024)   PRAPARE - Administrator, Civil Service (Medical): Patient declined    Lack of Transportation (Non-Medical): Patient declined  Physical Activity: Insufficiently Active (04/30/2024)   Exercise Vital Sign    Days of Exercise per Week: 3 days    Minutes of Exercise per Session: 40 min  Stress: No Stress Concern Present (04/30/2024)   [employer] of Occupational Health - Occupational Stress Questionnaire    Feeling of Stress:  Only a little  Social Connections: Moderately Integrated (04/30/2024)   Social Connection and Isolation Panel    Frequency of Communication with Friends and Family: More than three times a week    Frequency of Social Gatherings with Friends and Family: More than three times a week    Attends Religious Services: More than 4 times per year    Active Member of [employer] or Organizations: Yes    Attends Engineer, Structural: More than 4 times per year    Marital Status: Never married     Observations/Objective: Appears well in NAD Breathing normally Skin  appears warm and dry  Assessment and Plan:  Acute sinus infection Acute Has a history of recurrent sinus infections Start Z-Pak otc cold medications Rest, fluid Call if no improvement  Acute diarrhea Likely viral in nature There has been slight improvement since her symptoms started Continue increase fluids Bland diet Okay to take Imodium or Pepto-Bismol as needed If no improvement will need to do stool studies    Follow Up Instructions:    I discussed the assessment and treatment plan with the patient. The patient was provided an opportunity to ask questions and all were answered. The patient agreed with the plan and demonstrated an understanding of the instructions.   The patient was advised to call back or seek an in-person evaluation if the symptoms worsen or if the condition fails to improve as anticipated.    Glade JINNY Hope, MD

## 2024-08-18 ENCOUNTER — Encounter: Payer: Self-pay | Admitting: Internal Medicine

## 2024-08-18 ENCOUNTER — Telehealth: Admitting: Internal Medicine

## 2024-08-18 DIAGNOSIS — J019 Acute sinusitis, unspecified: Secondary | ICD-10-CM

## 2024-08-18 DIAGNOSIS — R197 Diarrhea, unspecified: Secondary | ICD-10-CM

## 2024-08-18 MED ORDER — AZITHROMYCIN 250 MG PO TABS: ORAL_TABLET | ORAL | 0 refills | Status: DC

## 2024-08-18 NOTE — Patient Instructions (Addendum)
 [name] [name]

## 2024-08-20 ENCOUNTER — Encounter: Payer: Self-pay | Admitting: Family Medicine

## 2024-08-21 ENCOUNTER — Ambulatory Visit: Payer: Self-pay | Admitting: Family Medicine

## 2024-08-21 ENCOUNTER — Other Ambulatory Visit: Payer: Self-pay

## 2024-08-21 ENCOUNTER — Other Ambulatory Visit

## 2024-08-21 DIAGNOSIS — R5383 Other fatigue: Secondary | ICD-10-CM

## 2024-08-21 LAB — CBC WITH DIFFERENTIAL/PLATELET
Basophils Absolute: 0 K/uL (ref 0.0–0.1)
Basophils Relative: 0.5 % (ref 0.0–3.0)
Eosinophils Absolute: 0.2 K/uL (ref 0.0–0.7)
Eosinophils Relative: 2.1 % (ref 0.0–5.0)
HCT: 37.1 % (ref 36.0–46.0)
Hemoglobin: 12.5 g/dL (ref 12.0–15.0)
Lymphocytes Relative: 42.1 % (ref 12.0–46.0)
Lymphs Abs: 3.4 K/uL (ref 0.7–4.0)
MCHC: 33.7 g/dL (ref 30.0–36.0)
MCV: 87.2 fl (ref 78.0–100.0)
Monocytes Absolute: 0.4 K/uL (ref 0.1–1.0)
Monocytes Relative: 5.4 % (ref 3.0–12.0)
Neutro Abs: 4 K/uL (ref 1.4–7.7)
Neutrophils Relative %: 49.9 % (ref 43.0–77.0)
Platelets: 280 K/uL (ref 150.0–400.0)
RBC: 4.26 Mil/uL (ref 3.87–5.11)
RDW: 14.8 % (ref 11.5–15.5)
WBC: 8 K/uL (ref 4.0–10.5)

## 2024-08-21 LAB — COMPREHENSIVE METABOLIC PANEL WITH GFR
ALT: 29 U/L (ref 0–35)
AST: 21 U/L (ref 0–37)
Albumin: 4.3 g/dL (ref 3.5–5.2)
Alkaline Phosphatase: 81 U/L (ref 39–117)
BUN: 14 mg/dL (ref 6–23)
CO2: 28 meq/L (ref 19–32)
Calcium: 9.4 mg/dL (ref 8.4–10.5)
Chloride: 102 meq/L (ref 96–112)
Creatinine, Ser: 0.98 mg/dL (ref 0.40–1.20)
GFR: 61.97 mL/min (ref >60.00)
Glucose, Bld: 85 mg/dL (ref 70–99)
Potassium: 3.4 meq/L — ABNORMAL LOW (ref 3.5–5.1)
Sodium: 139 meq/L (ref 135–145)
Total Bilirubin: 0.4 mg/dL (ref 0.2–1.2)
Total Protein: 6.9 g/dL (ref 6.0–8.3)

## 2024-08-21 LAB — VITAMIN B12: Vitamin B-12: 698 pg/mL (ref 211–911)

## 2024-08-21 LAB — IBC PANEL
Iron: 103 ug/dL (ref 42–145)
Saturation Ratios: 32.1 % (ref 20.0–50.0)
TIBC: 320.6 ug/dL (ref 250.0–450.0)
Transferrin: 229 mg/dL (ref 212.0–360.0)

## 2024-08-21 LAB — URIC ACID: Uric Acid, Serum: 5 mg/dL (ref 2.4–7.0)

## 2024-08-21 LAB — FERRITIN: Ferritin: 249.5 ng/mL (ref 10.0–291.0)

## 2024-08-21 LAB — TSH: TSH: 1.25 u[IU]/mL (ref 0.35–5.50)

## 2024-08-21 LAB — C-REACTIVE PROTEIN: CRP: 1 mg/dL (ref 0.5–20.0)

## 2024-08-21 LAB — SEDIMENTATION RATE: Sed Rate: 20 mm/h (ref 0–30)

## 2024-08-21 LAB — VITAMIN D 25 HYDROXY (VIT D DEFICIENCY, FRACTURES): VITD: 41.84 ng/mL (ref 30.00–100.00)

## 2024-08-23 LAB — PTH, INTACT AND CALCIUM
Calcium: 9.4 mg/dL (ref 8.6–10.4)
PTH: 16 pg/mL (ref 16–77)

## 2024-08-26 ENCOUNTER — Ambulatory Visit: Admitting: Family Medicine

## 2024-08-26 LAB — ANTI-NUCLEAR AB-TITER (ANA TITER): ANA Titer 1: 1:40 {titer} — ABNORMAL HIGH

## 2024-08-26 LAB — CALCIUM, IONIZED: Calcium, Ion: 5.1 mg/dL (ref 4.7–5.5)

## 2024-08-26 LAB — ANGIOTENSIN CONVERTING ENZYME: Angiotensin-Converting Enzyme: 12 U/L (ref 9–67)

## 2024-08-26 LAB — RHEUMATOID FACTOR: Rheumatoid fact SerPl-aCnc: <10 [IU]/mL (ref <14)

## 2024-08-26 LAB — ANA: Anti Nuclear Antibody (ANA): POSITIVE — AB

## 2024-08-26 LAB — CYCLIC CITRUL PEPTIDE ANTIBODY, IGG: Cyclic Citrullin Peptide Ab: <16 U

## 2024-08-28 NOTE — Progress Notes (Unsigned)
 [name] [address] [place] [address] Phone: [phone] Subjective:   [name], am serving as a scribe for Dr. Arthea Claudene.  I'm seeing this patient by the request  of:  Geofm Glade PARAS, MD  CC: Right shoulder pain acutely  YEP:Dlagzrupcz  08/06/2024 [facility].  Updated 08/29/2024 [name] [name] is a 63 y.o. female coming in with complaint of R hip and finger pain. Patient states that over the weekend her R shoulder popped and pain took her to her knees. Able to perform full AROM. Pain in anterior aspect that radiates into her bicep.   Hip is doing well.        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   Chronic constipation    Chronic fatigue 06/08/2016   Conjunctiva disorder 08/06/2018   Depression 09/03/2016   GAD (generalized anxiety disorder)    GERD    Herpes zoster without complication 12/11/2016   History of angioedema    ACE inhibitors   Hx of adenomatous colonic polyps 05/2014   Hyperlipidemia    Hypertension    Lactose intolerance    Migraine headache    Multiple sclerosis, relapsing-remitting 11/03/2015   neurologist--- dr shirlee   Personal history of chemotherapy    PMB (postmenopausal bleeding)    Sleep apnea    Past Surgical History:  Procedure Laterality Date   BREAST BIOPSY Right 2016   x4   BREAST BIOPSY Left 03/09/2023   BREAST BIOPSY Left 03/09/2023   US  LT BREAST BX W LOC DEV 1ST LESION IMG BX SPEC US  GUIDE 03/09/2023 [facility]   COLONOSCOPY  2018   DILATATION & CURETTAGE/HYSTEROSCOPY WITH MYOSURE N/A 09/29/2022   Procedure:  DILATATION & CURETTAGE/HYSTEROSCOPY WITH MYOSURE;  Surgeon: Gretta Gums, MD;  Location: [facility];  Service: Gynecology;  Laterality: N/A;   MASTECTOMY Right 2016   PORT-A-CATH REMOVAL Left 05/13/2015   Procedure: REMOVAL PORT-A-CATH;  Surgeon: Donnice Bury, MD;  Location: [facility] OR;  Service: General;  Laterality: Left;   PORTACATH PLACEMENT Left 11/06/2014   Procedure: INSERTION PORT-A-CATH;  Surgeon: Donnice Bury, MD;  Location: [facility];  Service: General;  Laterality: Left;   SIMPLE MASTECTOMY WITH AXILLARY SENTINEL NODE BIOPSY Right 05/13/2015   Procedure: RIGHT TOTAL  MASTECTOMY WITH RIGHT  AXILLARY SENTINEL NODE BIOPSY;  Surgeon: Donnice Bury, MD;  Location: [facility] OR;  Service: General;  Laterality: Right;   TUBAL LIGATION     yrs ago   Social History   Socioeconomic History   Marital status: Single    Spouse name: Not on file   Number of children: 2   Years of education: 12   Highest education level: Not on file  Occupational History   Occupation: Engineer, Drilling: [employer]  Tobacco Use   Smoking status: Never   Smokeless tobacco: Never  Vaping Use   Vaping status: Never Used  Substance and Sexual Activity   Alcohol use: No    Alcohol/week: 0.0 standard drinks of alcohol   Drug use: Never   Sexual activity: Not on file  Other Topics Concern   Not on  file  Social History Narrative   Patient is a administrator, 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    Patient is single and lives alone.    Patient is right handed.    Social Drivers of Health   Tobacco Use: Low Risk (08/17/2024)   Patient History    Smoking Tobacco Use: Never    Smokeless Tobacco Use: Never    Passive Exposure: Not on file  Financial Resource Strain: Low Risk (04/30/2024)   Overall Financial Resource Strain (CARDIA)    Difficulty of Paying Living Expenses: Not very hard  Food Insecurity: Patient  Declined (04/30/2024)   Epic    Worried About Programme Researcher, Broadcasting/film/video in the Last Year: Patient declined    Barista in the Last Year: Patient declined  Transportation Needs: Patient Declined (04/30/2024)   Epic    Lack of Transportation (Medical): Patient declined    Lack of Transportation (Non-Medical): Patient declined  Physical Activity: Insufficiently Active (04/30/2024)   Exercise Vital Sign    Days of Exercise per Week: 3 days    Minutes of Exercise per Session: 40 min  Stress: No Stress Concern Present (04/30/2024)   [employer] of Occupational Health - Occupational Stress Questionnaire    Feeling of Stress: Only a little  Social Connections: Moderately Integrated (04/30/2024)   Social Connection and Isolation Panel    Frequency of Communication with Friends and Family: More than three times a week    Frequency of Social Gatherings with Friends and Family: More than three times a week    Attends Religious Services: More than 4 times per year    Active Member of Clubs or Organizations: Yes    Attends Banker Meetings: More than 4 times per year    Marital Status: Never married  Depression (PHQ2-9): Low Risk (05/01/2024)   Depression (PHQ2-9)    PHQ-2 Score: 4  Alcohol Screen: Not on file  Housing: Unknown (04/30/2024)   Epic    Unable to Pay for Housing in the Last Year: No    Number of Times Moved in the Last Year: Not on file    Homeless in the Last Year: No  Utilities: Not on file  Health Literacy: Not on file   Allergies[1] Family History  Problem Relation Age of Onset   Coronary artery disease Mother    Heart disease Mother    Diabetes Mother    Hypertension Mother    Stroke Mother    Alcohol abuse Father    Colon cancer Sister    COPD Maternal Grandmother    Hypertension Other    Hyperlipidemia Other    Rectal cancer Neg Hx    Stomach cancer Neg Hx    Esophageal cancer Neg Hx    Breast cancer Neg Hx     Current Outpatient Medications  (Endocrine & Metabolic):    predniSONE  (DELTASONE ) 10 MG tablet, Take 4 tabs po qd x 3 days, then 3 tabs po qd x 3 days, then 2 tabs po qd x 3 days, then 1 tab po qd x 3 days  Current Outpatient Medications (Cardiovascular):    hydrochlorothiazide  (HYDRODIURIL ) 25 MG tablet, TAKE 1 TABLET (25 MG TOTAL) BY MOUTH DAILY.   losartan  (COZAAR ) 50 MG tablet, TAKE 1 TABLET BY MOUTH EVERY DAY   metoprolol  tartrate (LOPRESSOR ) 50 MG tablet, TAKE 1 TABLET BY MOUTH TWICE A DAY  Current Outpatient Medications (Respiratory):    albuterol  (VENTOLIN  HFA)  108 (90 Base) MCG/ACT inhaler, Inhale 2 puffs into the lungs every 6 (six) hours as needed for wheezing or shortness of breath.   Azelastine  HCl 137 MCG/SPRAY SOLN, PLACE 2 SPRAYS INTO BOTH NOSTRILS 2 (TWO) TIMES DAILY. USE IN EACH NOSTRIL AS DIRECTED (Patient taking differently: Place 2 sprays into the nose 2 (two) times daily.)   budesonide -formoterol  (SYMBICORT ) 160-4.5 MCG/ACT inhaler, Inhale 2 puffs into the lungs 2 (two) times daily.   fluticasone  (FLONASE ) 50 MCG/ACT nasal spray, SHAKE LIQUID AND USE 2 SPRAYS IN EACH NOSTRIL DAILY   ipratropium (ATROVENT ) 0.06 % nasal spray, Place 2 sprays into both nostrils 4 (four) times daily as needed (nasal congestion/drainage).   loratadine  (CLARITIN ) 10 MG tablet, Take 10 mg by mouth daily as needed for itching, rhinitis or allergies.  Current Outpatient Medications (Hematological):    ferrous gluconate  (FERGON) 324 MG tablet, Take 1 tablet (324 mg total) by mouth 2 (two) times daily with a meal. (Patient taking differently: Take 324 mg by mouth 3 (three) times a week. PER PT TAKES MON/ WED/ FRIDAY TWICE DAILY)   XARELTO  20 MG TABS tablet, TAKE 1 TABLET(20 MG) BY MOUTH DAILY WITH SUPPER  Current Outpatient Medications (Other):    ALPRAZolam  (XANAX ) 0.5 MG tablet, Take 1 tablet (0.5 mg total) by mouth daily as needed for anxiety or sleep.   azithromycin  (ZITHROMAX ) 250 MG tablet, Take two tabs the first day and  then one tab daily for four days   b complex vitamins tablet, Take 1 tablet by mouth daily.   Cholecalciferol (VITAMIN D3) 50 MCG (2000 UT) TABS, Take 1 tablet by mouth daily.   LINZESS  145 MCG CAPS capsule, TAKE 1 CAPSULE BY MOUTH EVERY DAY BEFORE BREAKFAST   pantoprazole  (PROTONIX ) 40 MG tablet, TAKE 1 TABLET BY MOUTH EVERY DAY   phentermine  15 MG capsule, Take 1 capsule (15 mg total) by mouth every morning.   potassium chloride  (KLOR-CON  M) 10 MEQ tablet, TAKE 4 TABLETS BY MOUTH EVERY DAY   Tart Cherry 1200 MG CAPS, Take 1 capsule by mouth at bedtime.   TURMERIC PO, Take 1 capsule by mouth daily.   valACYclovir  (VALTREX ) 500 MG tablet, Take 500 mg by mouth 2 (two) times daily as needed.   Vitamin D , Ergocalciferol , (DRISDOL ) 1.25 MG (50000 UNIT) CAPS capsule, Take 1 capsule (50,000 Units total) by mouth every 7 (seven) days.   Reviewed prior external information including notes and imaging from  primary care provider As well as notes that were available from care everywhere and other healthcare systems.  Past medical history, social, surgical and family history all reviewed in electronic medical record.  No pertanent information unless stated regarding to the chief complaint.   Review of Systems:  No headache, visual changes, nausea, vomiting, diarrhea, constipation, dizziness, abdominal pain, skin rash, fevers, chills, night sweats, weight loss, swollen lymph nodes, body aches, joint swelling, chest pain, shortness of breath, mood changes. POSITIVE muscle aches  Objective  Blood pressure 118/62, pulse 99, height 5' 3 (1.6 m), weight 198 lb (89.8 kg), last menstrual period 11/01/2012, SpO2 98%.   General: No apparent distress alert and oriented x3 mood and affect normal, dressed appropriately.  HEENT: Pupils equal, extraocular movements intact  Respiratory: Patient's speak in full sentences and does not appear short of breath  Cardiovascular: No lower extremity edema, non tender, no  erythema  Patient right shoulder does have positive impingement.  Does have voluntary guarding noted. Rotator cuff strength appears to be intact  Limited  muscular skeletal ultrasound was performed and interpreted by CLAUDENE HUSSAR, M  Limited ultrasound shows that there is some hypoechoic changes noted in the bicep tendon.  Seems to be more with a potential subluxation noted. Impression: Likely subluxation of the bicep tendon no true tear appreciated     Impression and Recommendations:    The above documentation has been reviewed and is accurate and complete Hussar CHRISTELLA Claudene, DO        [1]  Allergies Allergen Reactions   Ace Inhibitors Anaphylaxis     Angioedema (tongue swelling)   Clarithromycin Anaphylaxis    Throat swells   Contrave  [Naltrexone -Bupropion  Hcl Er] Shortness Of Breath    Chest and sob   Levaquin  [Levofloxacin ] Other (See Comments)    Tendon pain - shoulder and calf   Duloxetine  Other (See Comments)    Did not feel herself when taking it

## 2024-08-29 ENCOUNTER — Encounter: Payer: Self-pay | Admitting: Family Medicine

## 2024-08-29 ENCOUNTER — Other Ambulatory Visit: Payer: Self-pay

## 2024-08-29 ENCOUNTER — Ambulatory Visit: Admitting: Family Medicine

## 2024-08-29 ENCOUNTER — Ambulatory Visit: Payer: Self-pay | Admitting: Family Medicine

## 2024-08-29 VITALS — BP 118/62 | HR 99 | Ht 63.0 in | Wt 198.0 lb

## 2024-08-29 DIAGNOSIS — M25551 Pain in right hip: Secondary | ICD-10-CM

## 2024-08-29 DIAGNOSIS — M12811 Other specific arthropathies, not elsewhere classified, right shoulder: Secondary | ICD-10-CM

## 2024-08-29 NOTE — Assessment & Plan Note (Signed)
 Known rotator cuff arthropathy but seems to be more secondary to bicep subluxation.  Discussed icing regimen and home exercises, discussed which activities to do and which ones to avoid.  Increase activity slowly.  Follow-up with me again in 6 to 12 weeks

## 2024-08-29 NOTE — Patient Instructions (Signed)
 Arm compressison sleeve Ice See me in 4-6 weeks

## 2024-10-03 ENCOUNTER — Encounter (INDEPENDENT_AMBULATORY_CARE_PROVIDER_SITE_OTHER): Payer: Self-pay | Admitting: Internal Medicine

## 2024-10-03 DIAGNOSIS — G35D Multiple sclerosis, unspecified: Secondary | ICD-10-CM | POA: Diagnosis not present

## 2024-10-03 MED ORDER — PREDNISONE 10 MG PO TABS: ORAL_TABLET | ORAL | 0 refills | Status: AC

## 2024-10-03 NOTE — Telephone Encounter (Signed)

## 2024-10-07 ENCOUNTER — Ambulatory Visit: Admitting: Family Medicine

## 2024-10-10 NOTE — Progress Notes (Unsigned)
 " [name] [address] [place] [address] Phone: [phone] Subjective:    I'm seeing this patient by the request  of:  Geofm Glade PARAS, MD  CC:   YEP:Dlagzrupcz  08/29/2024 Known rotator cuff arthropathy but seems to be more secondary to bicep subluxation.  Discussed icing regimen and home exercises, discussed which activities to do and which ones to avoid.  Increase activity slowly.  Follow-up with me again in 6 to 12 weeks      Update 10/14/2024 [name] [name] is a 64 y.o. female coming in with complaint of R shoulder pain. 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   Chronic constipation    Chronic fatigue 06/08/2016   Conjunctiva disorder 08/06/2018   Depression 09/03/2016   GAD (generalized anxiety disorder)    GERD    Herpes zoster without complication 12/11/2016   History of angioedema    ACE inhibitors   Hx of adenomatous colonic polyps 05/2014   Hyperlipidemia    Hypertension    Lactose intolerance    Migraine headache    Multiple sclerosis, relapsing-remitting 11/03/2015   neurologist--- dr shirlee   Personal history of chemotherapy    PMB (postmenopausal bleeding)    Sleep apnea    Past Surgical History:  Procedure Laterality Date   BREAST BIOPSY Right 2016   x4   BREAST BIOPSY Left 03/09/2023   BREAST BIOPSY Left 03/09/2023   US  LT BREAST BX W LOC DEV 1ST LESION IMG BX SPEC US  GUIDE 03/09/2023 [facility]   COLONOSCOPY  2018   DILATATION & CURETTAGE/HYSTEROSCOPY WITH MYOSURE N/A 09/29/2022   Procedure: DILATATION &  CURETTAGE/HYSTEROSCOPY WITH MYOSURE;  Surgeon: Gretta Gums, MD;  Location: [facility];  Service: Gynecology;  Laterality: N/A;   MASTECTOMY Right 2016   PORT-A-CATH REMOVAL Left 05/13/2015   Procedure: REMOVAL PORT-A-CATH;  Surgeon: Donnice Bury, MD;  Location: [facility] OR;  Service: General;  Laterality: Left;   PORTACATH PLACEMENT Left 11/06/2014   Procedure: INSERTION PORT-A-CATH;  Surgeon: Donnice Bury, MD;  Location: [facility];  Service: General;  Laterality: Left;   SIMPLE MASTECTOMY WITH AXILLARY SENTINEL NODE BIOPSY Right 05/13/2015   Procedure: RIGHT TOTAL  MASTECTOMY WITH RIGHT  AXILLARY SENTINEL NODE BIOPSY;  Surgeon: Donnice Bury, MD;  Location: [facility] OR;  Service: General;  Laterality: Right;   TUBAL LIGATION     yrs ago   Social History   Socioeconomic History   Marital status: Single    Spouse name: Not on file   Number of children: 2   Years of education: 12   Highest education level: Not on file  Occupational History   Occupation: Engineer, Drilling: [employer]  Tobacco Use   Smoking status: Never   Smokeless tobacco: Never  Vaping Use   Vaping status: Never Used  Substance and Sexual Activity   Alcohol use: No    Alcohol/week: 0.0 standard drinks of alcohol   Drug use: Never   Sexual activity: Not on file  Other Topics Concern   Not on file  Social History Narrative  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    Patient has a barista.    Patient is single and lives alone.    Patient is right handed.    Social Drivers of Health   Tobacco Use: Low Risk (08/29/2024)   Patient History    Smoking Tobacco Use: Never    Smokeless Tobacco Use: Never    Passive Exposure: Not on file  Financial Resource Strain: Low Risk (04/30/2024)   Overall Financial Resource Strain (CARDIA)    Difficulty of Paying Living Expenses: Not very hard  Food Insecurity: Patient Declined  (04/30/2024)   Epic    Worried About Programme Researcher, Broadcasting/film/video in the Last Year: Patient declined    Barista in the Last Year: Patient declined  Transportation Needs: Patient Declined (04/30/2024)   Epic    Lack of Transportation (Medical): Patient declined    Lack of Transportation (Non-Medical): Patient declined  Physical Activity: Insufficiently Active (04/30/2024)   Exercise Vital Sign    Days of Exercise per Week: 3 days    Minutes of Exercise per Session: 40 min  Stress: No Stress Concern Present (04/30/2024)   [employer] of Occupational Health - Occupational Stress Questionnaire    Feeling of Stress: Only a little  Social Connections: Moderately Integrated (04/30/2024)   Social Connection and Isolation Panel    Frequency of Communication with Friends and Family: More than three times a week    Frequency of Social Gatherings with Friends and Family: More than three times a week    Attends Religious Services: More than 4 times per year    Active Member of Clubs or Organizations: Yes    Attends Banker Meetings: More than 4 times per year    Marital Status: Never married  Depression (PHQ2-9): Low Risk (05/01/2024)   Depression (PHQ2-9)    PHQ-2 Score: 4  Alcohol Screen: Not on file  Housing: Unknown (04/30/2024)   Epic    Unable to Pay for Housing in the Last Year: No    Number of Times Moved in the Last Year: Not on file    Homeless in the Last Year: No  Utilities: Not on file  Health Literacy: Not on file   Allergies[1] Family History  Problem Relation Age of Onset   Coronary artery disease Mother    Heart disease Mother    Diabetes Mother    Hypertension Mother    Stroke Mother    Alcohol abuse Father    Colon cancer Sister    COPD Maternal Grandmother    Hypertension Other    Hyperlipidemia Other    Rectal cancer Neg Hx    Stomach cancer Neg Hx    Esophageal cancer Neg Hx    Breast cancer Neg Hx     Current Outpatient Medications (Endocrine  & Metabolic):    predniSONE  (DELTASONE ) 10 MG tablet, Take 4 tabs po qd x 3 days, then 3 tabs po qd x 3 days, then 2 tabs po qd x 3 days, then 1 tab po qd x 3 days  Current Outpatient Medications (Cardiovascular):    hydrochlorothiazide  (HYDRODIURIL ) 25 MG tablet, TAKE 1 TABLET (25 MG TOTAL) BY MOUTH DAILY.   losartan  (COZAAR ) 50 MG tablet, TAKE 1 TABLET BY MOUTH EVERY DAY   metoprolol  tartrate (LOPRESSOR ) 50 MG tablet, TAKE 1 TABLET BY MOUTH TWICE A DAY  Current Outpatient Medications (Respiratory):    albuterol  (VENTOLIN  HFA) 108 (90 Base) MCG/ACT inhaler, Inhale 2  puffs into the lungs every 6 (six) hours as needed for wheezing or shortness of breath.   Azelastine  HCl 137 MCG/SPRAY SOLN, PLACE 2 SPRAYS INTO BOTH NOSTRILS 2 (TWO) TIMES DAILY. USE IN EACH NOSTRIL AS DIRECTED (Patient taking differently: Place 2 sprays into the nose 2 (two) times daily.)   budesonide -formoterol  (SYMBICORT ) 160-4.5 MCG/ACT inhaler, Inhale 2 puffs into the lungs 2 (two) times daily.   fluticasone  (FLONASE ) 50 MCG/ACT nasal spray, SHAKE LIQUID AND USE 2 SPRAYS IN EACH NOSTRIL DAILY   ipratropium (ATROVENT ) 0.06 % nasal spray, Place 2 sprays into both nostrils 4 (four) times daily as needed (nasal congestion/drainage).   loratadine  (CLARITIN ) 10 MG tablet, Take 10 mg by mouth daily as needed for itching, rhinitis or allergies.  Current Outpatient Medications (Hematological):    ferrous gluconate  (FERGON) 324 MG tablet, Take 1 tablet (324 mg total) by mouth 2 (two) times daily with a meal. (Patient taking differently: Take 324 mg by mouth 3 (three) times a week. PER PT TAKES MON/ WED/ FRIDAY TWICE DAILY)   XARELTO  20 MG TABS tablet, TAKE 1 TABLET(20 MG) BY MOUTH DAILY WITH SUPPER  Current Outpatient Medications (Other):    ALPRAZolam  (XANAX ) 0.5 MG tablet, Take 1 tablet (0.5 mg total) by mouth daily as needed for anxiety or sleep.   azithromycin  (ZITHROMAX ) 250 MG tablet, Take two tabs the first day and then one tab  daily for four days   b complex vitamins tablet, Take 1 tablet by mouth daily.   Cholecalciferol (VITAMIN D3) 50 MCG (2000 UT) TABS, Take 1 tablet by mouth daily.   LINZESS  145 MCG CAPS capsule, TAKE 1 CAPSULE BY MOUTH EVERY DAY BEFORE BREAKFAST   pantoprazole  (PROTONIX ) 40 MG tablet, TAKE 1 TABLET BY MOUTH EVERY DAY   phentermine  15 MG capsule, Take 1 capsule (15 mg total) by mouth every morning.   potassium chloride  (KLOR-CON  M) 10 MEQ tablet, TAKE 4 TABLETS BY MOUTH EVERY DAY   Tart Cherry 1200 MG CAPS, Take 1 capsule by mouth at bedtime.   TURMERIC PO, Take 1 capsule by mouth daily.   valACYclovir  (VALTREX ) 500 MG tablet, Take 500 mg by mouth 2 (two) times daily as needed.   Vitamin D , Ergocalciferol , (DRISDOL ) 1.25 MG (50000 UNIT) CAPS capsule, Take 1 capsule (50,000 Units total) by mouth every 7 (seven) days.   Reviewed prior external information including notes and imaging from  primary care provider As well as notes that were available from care everywhere and other healthcare systems.  Past medical history, social, surgical and family history all reviewed in electronic medical record.  No pertanent information unless stated regarding to the chief complaint.   Review of Systems:  No headache, visual changes, nausea, vomiting, diarrhea, constipation, dizziness, abdominal pain, skin rash, fevers, chills, night sweats, weight loss, swollen lymph nodes, body aches, joint swelling, chest pain, shortness of breath, mood changes. POSITIVE muscle aches  Objective  Last menstrual period 11/01/2012.   General: No apparent distress alert and oriented x3 mood and affect normal, dressed appropriately.  HEENT: Pupils equal, extraocular movements intact  Respiratory: Patient's speak in full sentences and does not appear short of breath  Cardiovascular: No lower extremity edema, non tender, no erythema      Impression and Recommendations:           [1]  Allergies Allergen Reactions    Ace Inhibitors Anaphylaxis     Angioedema (tongue swelling)   Clarithromycin Anaphylaxis    Throat swells   Contrave  [  Naltrexone -Bupropion  Hcl Er] Shortness Of Breath    Chest and sob   Levaquin  [Levofloxacin ] Other (See Comments)    Tendon pain - shoulder and calf   Duloxetine  Other (See Comments)    Did not feel herself when taking it   "

## 2024-10-14 ENCOUNTER — Ambulatory Visit: Admitting: Family Medicine

## 2024-10-14 ENCOUNTER — Other Ambulatory Visit: Payer: Self-pay

## 2024-10-14 ENCOUNTER — Encounter: Payer: Self-pay | Admitting: Internal Medicine

## 2024-10-14 ENCOUNTER — Telehealth: Payer: Self-pay

## 2024-10-14 ENCOUNTER — Encounter: Payer: Self-pay | Admitting: Family Medicine

## 2024-10-14 ENCOUNTER — Ambulatory Visit: Admitting: Internal Medicine

## 2024-10-14 VITALS — BP 144/88 | HR 62 | Ht 65.0 in | Wt 201.1 lb

## 2024-10-14 VITALS — BP 122/74 | HR 80 | Ht 65.0 in

## 2024-10-14 DIAGNOSIS — K5909 Other constipation: Secondary | ICD-10-CM | POA: Diagnosis not present

## 2024-10-14 DIAGNOSIS — M6740 Ganglion, unspecified site: Secondary | ICD-10-CM | POA: Diagnosis not present

## 2024-10-14 DIAGNOSIS — Z860100 Personal history of colon polyps, unspecified: Secondary | ICD-10-CM

## 2024-10-14 DIAGNOSIS — K219 Gastro-esophageal reflux disease without esophagitis: Secondary | ICD-10-CM

## 2024-10-14 DIAGNOSIS — M12811 Other specific arthropathies, not elsewhere classified, right shoulder: Secondary | ICD-10-CM

## 2024-10-14 DIAGNOSIS — Z860101 Personal history of adenomatous and serrated colon polyps: Secondary | ICD-10-CM

## 2024-10-14 DIAGNOSIS — Z7901 Long term (current) use of anticoagulants: Secondary | ICD-10-CM

## 2024-10-14 MED ORDER — NA SULFATE-K SULFATE-MG SULF 17.5-3.13-1.6 GM/177ML PO SOLN: 1.0000 | Freq: Once | ORAL | 0 refills | Status: AC

## 2024-10-14 NOTE — Telephone Encounter (Signed)
 Patient with diagnosis of atrial flutter on Xarelto  for anticoagulation.    What type of surgery is being performed?     colonoscopy  When is this surgery scheduled?     11/04/2024    CHA2DS2-VASc Score = 2   This indicates a 2.2% annual risk of stroke. The patient's score is based upon: CHF History: 0 HTN History: 1 Diabetes History: 0 Stroke History: 0 Vascular Disease History: 0 Age Score: 0 Gender Score: 1       CrCl 65 ml/min Platelet count 280 K  Patient has not had an Afib/aflutter ablation in the last 3 months, DCCV within the last 4 weeks or a watchman implanted in the last 45 days   Chadsvasc of 2 - last cardiology visit, Dr. Elmira stated, If there is no A-fib/flutter on her monitor, she does not need anticoagulation.   Mobile cardiac telemetry 30 days 04/01/2024 - 04/30/2024: Dominant rhythm: Sinus. HR 53-158 bpm. Avg HR 77 bpm. No atrial fibrillation/atrial flutter/SVT/VT/high grade AV block, sinus pause >3sec noted. <1% SVE/VE.  If Dr. Elmira would like to continue anticoagulation, okay to hold Xarelto  2 days prior to procedure. Would clarify with provider.   **This guidance is not considered finalized until pre-operative APP has relayed final recommendations.**

## 2024-10-14 NOTE — Progress Notes (Signed)
 " [name] [name] JENI [facility] [address] [place] [address] Phone: [phone] Subjective:   [name] [name], am serving as a scribe for Dr. Arthea [name].  I'm seeing this patient by the request  of:  Geofm Glade PARAS, MD  CC:   YEP:Dlagzrupcz  08/29/2024 Known rotator cuff arthropathy but seems to be more secondary to bicep subluxation.  Discussed icing regimen and home exercises, discussed which activities to do and which ones to avoid.  Increase activity slowly.  Follow-up with me again in 6 to 12 weeks      Update 10/15/2024 [name] [name] is a 64 y.o. female coming in with complaint of R shoulder and Leg pain. Patient states thinks cyst is coming back. Shoulder is staying about the same. Leg is progressively getting worse.       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   Arthritis    Breast cancer of upper-outer quadrant of right female breast (HCC) 10/23/2014   s/p r mastectomy, neoadj chemo completed 04/21/15; neg genetic testing   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   Chronic constipation    Chronic fatigue 06/08/2016   Conjunctiva disorder 08/06/2018   Depression 09/03/2016   GAD (generalized anxiety disorder)    GERD    Herpes zoster without complication 12/11/2016   History of angioedema    ACE inhibitors   Hx of adenomatous colonic polyps 05/2014   Hyperlipidemia    Hypertension    IBS (irritable bowel syndrome)    IBS (irritable bowel syndrome)    Lactose intolerance    Migraine headache    Multiple sclerosis, relapsing-remitting 11/03/2015   neurologist--- dr shirlee   Personal history of chemotherapy    PMB (postmenopausal bleeding)    Sleep apnea    Past Surgical History:  Procedure Laterality Date   BREAST BIOPSY Right 2016   x4   BREAST BIOPSY Left 03/09/2023    BREAST BIOPSY Left 03/09/2023   US  LT BREAST BX W LOC DEV 1ST LESION IMG BX SPEC US  GUIDE 03/09/2023 [facility]   COLONOSCOPY  2018   DILATATION & CURETTAGE/HYSTEROSCOPY WITH MYOSURE N/A 09/29/2022   Procedure: DILATATION & CURETTAGE/HYSTEROSCOPY WITH MYOSURE;  Surgeon: Gretta Gums, MD;  Location: [facility];  Service: Gynecology;  Laterality: N/A;   MASTECTOMY Right 2016   PORT-A-CATH REMOVAL Left 05/13/2015   Procedure: REMOVAL PORT-A-CATH;  Surgeon: Donnice Bury, MD;  Location: [facility] OR;  Service: General;  Laterality: Left;   PORTACATH PLACEMENT Left 11/06/2014   Procedure: INSERTION PORT-A-CATH;  Surgeon: Donnice Bury, MD;  Location: [facility];  Service: General;  Laterality: Left;   SIMPLE MASTECTOMY WITH AXILLARY SENTINEL NODE BIOPSY Right 05/13/2015   Procedure: RIGHT TOTAL  MASTECTOMY WITH RIGHT  AXILLARY SENTINEL NODE BIOPSY;  Surgeon: Donnice Bury, MD;  Location: [facility] OR;  Service: General;  Laterality: Right;   TUBAL LIGATION     yrs ago   Social History   Socioeconomic History   Marital status: Single    Spouse name: Not on file   Number of children: 2   Years of education: 12   Highest education level: Not on file  Occupational History   Occupation: Dispatcher-Retired    Employer: [employer]  Tobacco Use   Smoking status: Never   Smokeless tobacco: Never  Vaping Use   Vaping  status: Never Used  Substance and Sexual Activity   Alcohol use: No    Alcohol/week: 0.0 standard drinks of alcohol   Drug use: Never   Sexual activity: Not on file  Other Topics Concern   Not on file  Social History Narrative   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    Patient has a barista.    Patient is single and lives alone.    Patient is right handed.    Social Drivers of Health   Tobacco Use: Low Risk (10/14/2024)   Patient History    Smoking Tobacco Use: Never    Smokeless  Tobacco Use: Never    Passive Exposure: Not on file  Financial Resource Strain: Low Risk (04/30/2024)   Overall Financial Resource Strain (CARDIA)    Difficulty of Paying Living Expenses: Not very hard  Food Insecurity: Patient Declined (04/30/2024)   Epic    Worried About Programme Researcher, Broadcasting/film/video in the Last Year: Patient declined    Barista in the Last Year: Patient declined  Transportation Needs: Patient Declined (04/30/2024)   Epic    Lack of Transportation (Medical): Patient declined    Lack of Transportation (Non-Medical): Patient declined  Physical Activity: Insufficiently Active (04/30/2024)   Exercise Vital Sign    Days of Exercise per Week: 3 days    Minutes of Exercise per Session: 40 min  Stress: No Stress Concern Present (04/30/2024)   [employer] of Occupational Health - Occupational Stress Questionnaire    Feeling of Stress: Only a little  Social Connections: Moderately Integrated (04/30/2024)   Social Connection and Isolation Panel    Frequency of Communication with Friends and Family: More than three times a week    Frequency of Social Gatherings with Friends and Family: More than three times a week    Attends Religious Services: More than 4 times per year    Active Member of Clubs or Organizations: Yes    Attends Banker Meetings: More than 4 times per year    Marital Status: Never married  Depression (PHQ2-9): Low Risk (05/01/2024)   Depression (PHQ2-9)    PHQ-2 Score: 4  Alcohol Screen: Not on file  Housing: Unknown (04/30/2024)   Epic    Unable to Pay for Housing in the Last Year: No    Number of Times Moved in the Last Year: Not on file    Homeless in the Last Year: No  Utilities: Not on file  Health Literacy: Not on file   Allergies[1] Family History  Problem Relation Age of Onset   Coronary artery disease Mother    Heart disease Mother    Diabetes Mother    Hypertension Mother    Stroke Mother    Alcohol abuse Father    Colon  cancer Sister    Alcoholism Brother    Alcoholism Brother    COPD Maternal Grandmother    Hypertension Other    Hyperlipidemia Other    Rectal cancer Neg Hx    Stomach cancer Neg Hx    Esophageal cancer Neg Hx    Breast cancer Neg Hx     Current Outpatient Medications (Endocrine & Metabolic):    predniSONE  (DELTASONE ) 10 MG tablet, Take 4 tabs po qd x 3 days, then 3 tabs po qd x 3 days, then 2 tabs po qd x 3 days, then 1 tab po qd x 3 days  Current Outpatient Medications (Cardiovascular):    hydrochlorothiazide  (HYDRODIURIL ) 25  MG tablet, TAKE 1 TABLET (25 MG TOTAL) BY MOUTH DAILY.   losartan  (COZAAR ) 50 MG tablet, TAKE 1 TABLET BY MOUTH EVERY DAY   metoprolol  tartrate (LOPRESSOR ) 50 MG tablet, TAKE 1 TABLET BY MOUTH TWICE A DAY  Current Outpatient Medications (Respiratory):    albuterol  (VENTOLIN  HFA) 108 (90 Base) MCG/ACT inhaler, Inhale 2 puffs into the lungs every 6 (six) hours as needed for wheezing or shortness of breath.   Azelastine  HCl 137 MCG/SPRAY SOLN, PLACE 2 SPRAYS INTO BOTH NOSTRILS 2 (TWO) TIMES DAILY. USE IN EACH NOSTRIL AS DIRECTED (Patient taking differently: Place 2 sprays into the nose 2 (two) times daily.)   budesonide -formoterol  (SYMBICORT ) 160-4.5 MCG/ACT inhaler, Inhale 2 puffs into the lungs 2 (two) times daily.   fluticasone  (FLONASE ) 50 MCG/ACT nasal spray, SHAKE LIQUID AND USE 2 SPRAYS IN EACH NOSTRIL DAILY   ipratropium (ATROVENT ) 0.06 % nasal spray, Place 2 sprays into both nostrils 4 (four) times daily as needed (nasal congestion/drainage).   loratadine  (CLARITIN ) 10 MG tablet, Take 10 mg by mouth daily as needed for itching, rhinitis or allergies.  Current Outpatient Medications (Hematological):    ferrous gluconate  (FERGON) 324 MG tablet, Take 1 tablet (324 mg total) by mouth 2 (two) times daily with a meal. (Patient taking differently: Take 324 mg by mouth 3 (three) times a week. PER PT TAKES MON/ WED/ FRIDAY TWICE DAILY)   XARELTO  20 MG TABS tablet,  TAKE 1 TABLET(20 MG) BY MOUTH DAILY WITH SUPPER  Current Outpatient Medications (Other):    ALPRAZolam  (XANAX ) 0.5 MG tablet, Take 1 tablet (0.5 mg total) by mouth daily as needed for anxiety or sleep.   b complex vitamins tablet, Take 1 tablet by mouth daily.   Cholecalciferol (VITAMIN D3) 50 MCG (2000 UT) TABS, Take 1 tablet by mouth daily.   LINZESS  145 MCG CAPS capsule, TAKE 1 CAPSULE BY MOUTH EVERY DAY BEFORE BREAKFAST   Na Sulfate-K Sulfate-Mg Sulfate concentrate (SUPREP) 17.5-3.13-1.6 GM/177ML SOLN, Take 1 kit (354 mLs total) by mouth once for 1 dose.   pantoprazole  (PROTONIX ) 40 MG tablet, TAKE 1 TABLET BY MOUTH EVERY DAY   potassium chloride  (KLOR-CON  M) 10 MEQ tablet, TAKE 4 TABLETS BY MOUTH EVERY DAY   Tart Cherry 1200 MG CAPS, Take 1 capsule by mouth at bedtime.   TURMERIC PO, Take 1 capsule by mouth daily.   valACYclovir  (VALTREX ) 500 MG tablet, Take 500 mg by mouth 2 (two) times daily as needed.   Reviewed prior external information including notes and imaging from  primary care provider As well as notes that were available from care everywhere and other healthcare systems.  Past medical history, social, surgical and family history all reviewed in electronic medical record.  No pertanent information unless stated regarding to the chief complaint.   Review of Systems:  No headache, visual changes, nausea, vomiting, diarrhea, constipation, dizziness, abdominal pain, skin rash, fevers, chills, night sweats, weight loss, swollen lymph nodes, body aches, joint swelling, chest pain, shortness of breath, mood changes. POSITIVE muscle aches  Objective  Blood pressure 122/74, pulse 80, height 5' 5 (1.651 m), last menstrual period 11/01/2012, SpO2 98%.   General: No apparent distress alert and oriented x3 mood and affect normal, dressed appropriately.  HEENT: Pupils equal, extraocular movements intact  Respiratory: Patient's speak in full sentences and does not appear short of  breath  Cardiovascular: No lower extremity edema, non tender, no erythema  Leg exam of the anterior tibialis does have a very small nodule noted.  In the area where patient did have a ganglion cyst previously.  Superficial.  Procedure: Real-time Ultrasound Guided Injection of right anterior tibialis ganglion cyst Device: GE Logiq Q7 Ultrasound guided injection is preferred based studies that show increased duration, increased effect, greater accuracy, decreased procedural pain, increased response rate, and decreased cost with ultrasound guided versus blind injection.  Verbal informed consent obtained.  Time-out conducted.  Noted no overlying erythema, induration, or other signs of local infection.  Skin prepped in a sterile fashion.  Local anesthesia: Topical Ethyl chloride.  With sterile technique and under real time ultrasound guidance: With a 25-gauge half inch needle injected with 0.5 cc of 0.5% Marcaine  and 0.5 cc of Kenalog  40 mg/mL Completed without difficulty  Pain immediately resolved suggesting accurate placement of the medication.  Advised to call if fevers/chills, erythema, induration, drainage, or persistent bleeding.  Images saved Impression: Technically successful ultrasound guided injection.    Impression and Recommendations:     The above documentation has been reviewed and is accurate and complete [name] CHRISTELLA Sharps, DO       [1]  Allergies Allergen Reactions   Ace Inhibitors Anaphylaxis     Angioedema (tongue swelling)   Clarithromycin Anaphylaxis    Throat swells   Contrave  [Naltrexone -Bupropion  Hcl Er] Shortness Of Breath    Chest and sob   Levaquin  [Levofloxacin ] Other (See Comments)    Tendon pain - shoulder and calf   Duloxetine  Other (See Comments)    Did not feel herself when taking it   "

## 2024-10-14 NOTE — Telephone Encounter (Signed)
 [facility] Pre-operative Risk Assessment     Request for surgical clearance:     Endoscopy Procedure  What type of surgery is being performed?     colonoscopy  When is this surgery scheduled?     11/04/2024  What type of clearance is required ?   Pharmacy  Are there any medications that need to be held prior to surgery and how long? Xarelto  - 2 days  Practice name and name of physician performing surgery?      [facility]  What is your office phone and fax number?      Phone- [phone]  Fax- [phone]  Anesthesia type (None, local, MAC, general) ?       MAC   Please route your response to [facility]

## 2024-10-14 NOTE — Assessment & Plan Note (Signed)
 Reaccumulation and injection again.  Tolerated the procedure well, discussed with patient about icing regimen and home exercises, increasing activity slowly.  Discussed potential compression.  Discussed the potential side effect of the steroid.  Follow-up with me again in 2 to 3 months.

## 2024-10-14 NOTE — Patient Instructions (Signed)
 You have been scheduled for a colonoscopy. Please follow written instructions given to you at your visit today.   If you use inhalers (even only as needed), please bring them with you on the day of your procedure.  DO NOT TAKE 7 DAYS PRIOR TO TEST- Trulicity (dulaglutide) Ozempic , Wegovy  (semaglutide ) Mounjaro, Zepbound (tirzepatide) Bydureon Bcise (exanatide extended release)  DO NOT TAKE 1 DAY PRIOR TO YOUR TEST Rybelsus (semaglutide ) Adlyxin (lixisenatide) Victoza (liraglutide ) Byetta (exanatide) ___________________________________________________________________________  _______________________________________________________  If your blood pressure at your visit was 140/90 or greater, please contact your primary care physician to follow up on this.  _______________________________________________________  If you are age 77 or older, your body mass index should be between 23-30. Your Body mass index is 33.47 kg/m. If this is out of the aforementioned range listed, please consider follow up with your Primary Care Provider.  If you are age 46 or younger, your body mass index should be between 19-25. Your Body mass index is 33.47 kg/m. If this is out of the aformentioned range listed, please consider follow up with your Primary Care Provider.   ________________________________________________________  The [facility] providers would like to encourage you to use MYCHART to communicate with providers for non-urgent requests or questions.  Due to long hold times on the telephone, sending your provider a message by [facility] may be a faster and more efficient way to get a response.  Please allow 48 business hours for a response.  Please remember that this is for non-urgent requests.  _______________________________________________________  [facility] is using a team-based approach to care.  Your team is made up of your doctor and two to three APPS. Our APPS (Nurse  Practitioners and Physician Assistants) work with your physician to ensure care continuity for you. They are fully qualified to address your health concerns and develop a treatment plan. They communicate directly with your gastroenterologist to care for you. Seeing the Advanced Practice Practitioners on your physician's team can help you by facilitating care more promptly, often allowing for earlier appointments, access to diagnostic testing, procedures, and other specialty referrals.

## 2024-10-14 NOTE — Telephone Encounter (Signed)
 Okay to hold 2 days for colonoscopy.  Thanks MJP

## 2024-10-14 NOTE — Patient Instructions (Signed)
 Good to see you! Injection in R leg See you again in 2 months

## 2024-10-14 NOTE — Progress Notes (Signed)
 HISTORY OF PRESENT ILLNESS:  [name] [name] is a 64 y.o. 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 The patient's GI history is remarkable for GERD, chronic constipation, and adenomatous colon polyps. The patient presents today principally regarding the need for surveillance colonoscopy. She underwent her index examination in July 2007 with an adenomatous colon polyp.  Subsequent examination September 2015 revealed 2 sessile serrated polyps.  Her most recent examination was performed August 27, 2019.  She was found to have an adenomatous colon polyp.  Exam was otherwise unremarkable except for hemorrhoids.  Due to history of multiple precancerous polyps follow-up in 5 years recommended. Patient tells me that she continues to use pantoprazole  for her reflux disease. She will get some breakthrough symptoms with dietary indiscretion but for the most part this is helpful. No dysphagia. Next, her constipation remains nicely managed with Linzess  therapy. Currently taking 145 mcg daily.  Her last office visit with cardiology was March 26, 2024.  Reviewed.  CHA2DS2-VASc score 2. Review of outside laboratories from December 2025 essentially normal basic metabolic panel with potassium 3.4 creatinine 0.98. Review of x-ray file shows abdominal ultrasound from May 2015 was unremarkable.   Since her last procedure, she tells me she has been doing well from a cardiac standpoint and has not required any significant hospitalizations.  She did have a D&C January 2024. REVIEW OF SYSTEMS:  All non-GI ROS negative except for arthritis  Past Medical History:  Diagnosis Date   Allergic rhinitis due to other allergen    Anticoagulated    xarelto --- managed by cardiology   Antineoplastic chemotherapy induced  anemia 02/25/2015   Arthritis    Breast cancer of upper-outer quadrant of right female breast (HCC) 10/23/2014   s/p r mastectomy, neoadj chemo completed 04/21/15; neg genetic testing   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   Chronic constipation    Chronic fatigue 06/08/2016   Conjunctiva disorder 08/06/2018   Depression 09/03/2016   GAD (generalized anxiety disorder)    GERD    Herpes zoster without complication 12/11/2016   History of angioedema    ACE inhibitors   Hx of adenomatous colonic polyps 05/2014   Hyperlipidemia    Hypertension    IBS (irritable bowel syndrome)    IBS (irritable bowel syndrome)    Lactose intolerance    Migraine headache    Multiple sclerosis, relapsing-remitting 11/03/2015   neurologist--- dr shirlee   Personal history of chemotherapy    PMB (postmenopausal bleeding)    Sleep apnea     Past Surgical History:  Procedure Laterality Date   BREAST BIOPSY Right 2016   x4   BREAST BIOPSY Left 03/09/2023   BREAST BIOPSY Left 03/09/2023   US  LT BREAST BX W LOC DEV 1ST LESION IMG BX SPEC US  GUIDE 03/09/2023 [facility]   COLONOSCOPY  2018   DILATATION & CURETTAGE/HYSTEROSCOPY WITH MYOSURE N/A 09/29/2022   Procedure: DILATATION & CURETTAGE/HYSTEROSCOPY WITH MYOSURE;  Surgeon: Gretta Gums, MD;  Location: [facility];  Service: Gynecology;  Laterality: N/A;   MASTECTOMY Right 2016   PORT-A-CATH REMOVAL Left 05/13/2015   Procedure: REMOVAL PORT-A-CATH;  Surgeon: Donnice Bury, MD;  Location: [facility];  Service: General;  Laterality: Left;   PORTACATH PLACEMENT Left 11/06/2014   Procedure: INSERTION PORT-A-CATH;  Surgeon: Donnice Bury, MD;  Location:  SURGERY CENTER;  Service: General;  Laterality: Left;   SIMPLE MASTECTOMY WITH AXILLARY SENTINEL NODE BIOPSY Right 05/13/2015   Procedure: RIGHT TOTAL  MASTECTOMY WITH RIGHT  AXILLARY SENTINEL NODE BIOPSY;  Surgeon: Donnice Bury, MD;  Location: [facility] OR;   Service: General;  Laterality: Right;   TUBAL LIGATION     yrs ago    Social History Avacyn Kloosterman  reports that she has never smoked. She has never used smokeless tobacco. She reports that she does not drink alcohol and does not use drugs.  family history includes Alcohol abuse in her father; Alcoholism in her brother and brother; COPD in her maternal grandmother; Colon cancer in her sister; Coronary artery disease in her mother; Diabetes in her mother; Heart disease in her mother; Hyperlipidemia in an other family member; Hypertension in her mother and another family member; Stroke in her mother.  Allergies[1]     PHYSICAL EXAMINATION: Vital signs: BP (!) 144/88 (BP Location: Left Arm, Cuff Size: Normal)   Pulse 62   Ht 5' 5 (1.651 m) Comment: height measured without shoes  Wt 201 lb 2 oz (91.2 kg)   LMP 11/01/2012   BMI 33.47 kg/m   Constitutional: generally well-appearing, no acute distress Psychiatric: alert and oriented x3, cooperative Eyes: extraocular movements intact, anicteric, conjunctiva pink Mouth: oral pharynx moist, no lesions Neck: supple no lymphadenopathy Cardiovascular: heart regular rate and rhythm, no murmur Lungs: clear to auscultation bilaterally Abdomen: soft, obese, nontender, nondistended, no obvious ascites, no peritoneal signs, normal bowel sounds, no organomegaly Rectal deferred until colonoscopy Extremities: no clubbing, cyanosis, or lower extremity edema bilaterally Skin: no lesions on visible extremities Neuro: No focal deficits.  Cranial nerves intact   ASSESSMENT:   1.  GERD.  For the most part controlled with pantoprazole  40 mg daily 2.  Chronic constipation.  For the most part controlled with Linzess  145 mcg daily 3.  History of adenomatous and sessile serrated polyps (multiple).  Due for surveillance colonoscopy.  Last exam 2020 4.  Multiple medical problems including history of paroxysmal atrial flutter for which she is on Xarelto       PLAN:   1.  Reflux precautions with attention to weight loss 2.  Continue pantoprazole  40 mg daily to be taken 30 to 60 minutes before breakfast 3.  Continue Linzess  as needed for chronic constipation 4.  Schedule surveillance colonoscopy.  The patient is HIGH RISK given her comorbidities and the need to address chronic anticoagulation therapy.  Given low CHA2DS2-VASc score and normal renal function I would recommend holding her Xarelto  2 days prior to the procedure and the day of the procedure.  Would anticipate resumption of medication immediately post procedure.  We will confer with her cardiologist, Dr. Patwardan, to be sure that this is medically reasonable.The nature of the procedure, as well as the risks, benefits, and alternatives were carefully and thoroughly reviewed with the patient. Ample time for discussion and questions allowed. The patient understood, was satisfied, and agreed to proceed. A total time of 45 minutes was spent preparing to see the patient, obtaining comprehensive history, performing medically appropriate physical examination, counseling and educating the patient regarding the above listed issues, ordering colonoscopy, addressing anticoagulation medication, and documenting clinical information in the health record         [1]  Allergies Allergen Reactions   Ace Inhibitors Anaphylaxis     Angioedema (tongue swelling)   Clarithromycin Anaphylaxis    Throat swells   Contrave  [Naltrexone -Bupropion  Hcl Er] Shortness Of Breath  Chest and sob   Levaquin  [Levofloxacin ] Other (See Comments)    Tendon pain - shoulder and calf   Duloxetine  Other (See Comments)    Did not feel herself when taking it

## 2024-10-14 NOTE — Assessment & Plan Note (Signed)
 Stable overall.  Discussed icing regimen and home exercises, increase activity slowly.  Discussed follow-up again in 6 to 8 weeks.

## 2024-10-14 NOTE — Telephone Encounter (Signed)
 Will route to PharmD for rec's re: holding anticoagulation. Glendia Ferrier, PA-C    10/14/2024 11:59 AM

## 2024-10-14 NOTE — Telephone Encounter (Signed)
"  ° °  Patient Name: [name] [name]  DOB: [birth year] MRN: [id number]  Primary Cardiologist: Newman JINNY Lawrence, MD  Chart reviewed as part of pre-operative protocol coverage. Patient is scheduled to have a colonoscopy on 11/04/2024, and we were asked to give our recommendations for holding Xarelto . Per pharmacy and office protocol, patient can hold Xarelto  for 2 days prior to procedure.   I will route this recommendation to the requesting party and remove from pre-op pool.  Please call with questions.  Darianna Amy E Jaydrian Corpening, PA-C 10/14/2024, 8:03 PM  "

## 2024-10-15 NOTE — Telephone Encounter (Signed)
 Spoke with patient and told her that, per cardiology, she could hold her Xarelto  for 2 days prior to her procedure.  Patient agreed

## 2024-10-27 ENCOUNTER — Ambulatory Visit: Admitting: Family Medicine

## 2024-11-04 ENCOUNTER — Encounter: Admitting: Internal Medicine

## 2024-11-12 ENCOUNTER — Ambulatory Visit: Admitting: Cardiology

## 2024-12-12 ENCOUNTER — Ambulatory Visit: Admitting: Family Medicine
